# Patient Record
Sex: Female | Born: 1935
Health system: Southern US, Community
[De-identification: ages and names within clinical notes are randomized; demographics above are authoritative.]

## PROBLEM LIST (undated history)

## (undated) DIAGNOSIS — E871 Hypo-osmolality and hyponatremia: Secondary | ICD-10-CM

## (undated) DIAGNOSIS — G4700 Insomnia, unspecified: Secondary | ICD-10-CM

## (undated) DIAGNOSIS — G43909 Migraine, unspecified, not intractable, without status migrainosus: Secondary | ICD-10-CM

## (undated) DIAGNOSIS — E079 Disorder of thyroid, unspecified: Secondary | ICD-10-CM

## (undated) DIAGNOSIS — M792 Neuralgia and neuritis, unspecified: Secondary | ICD-10-CM

## (undated) DIAGNOSIS — J329 Chronic sinusitis, unspecified: Secondary | ICD-10-CM

## (undated) DIAGNOSIS — G4730 Sleep apnea, unspecified: Secondary | ICD-10-CM

## (undated) DIAGNOSIS — C569 Malignant neoplasm of unspecified ovary: Secondary | ICD-10-CM

## (undated) DIAGNOSIS — Z9221 Personal history of antineoplastic chemotherapy: Secondary | ICD-10-CM

## (undated) DIAGNOSIS — K219 Gastro-esophageal reflux disease without esophagitis: Secondary | ICD-10-CM

## (undated) DIAGNOSIS — I10 Essential (primary) hypertension: Secondary | ICD-10-CM

## (undated) DIAGNOSIS — J45909 Unspecified asthma, uncomplicated: Secondary | ICD-10-CM

## (undated) DIAGNOSIS — C50919 Malignant neoplasm of unspecified site of unspecified female breast: Secondary | ICD-10-CM

## (undated) DIAGNOSIS — Z853 Personal history of malignant neoplasm of breast: Secondary | ICD-10-CM

## (undated) DIAGNOSIS — I639 Cerebral infarction, unspecified: Secondary | ICD-10-CM

## (undated) DIAGNOSIS — O321XX0 Maternal care for breech presentation, not applicable or unspecified: Secondary | ICD-10-CM

## (undated) DIAGNOSIS — Z87440 Personal history of urinary (tract) infections: Secondary | ICD-10-CM

## (undated) DIAGNOSIS — J40 Bronchitis, not specified as acute or chronic: Secondary | ICD-10-CM

## (undated) DIAGNOSIS — E119 Type 2 diabetes mellitus without complications: Secondary | ICD-10-CM

## (undated) DIAGNOSIS — E039 Hypothyroidism, unspecified: Secondary | ICD-10-CM

## (undated) DIAGNOSIS — Z803 Family history of malignant neoplasm of breast: Secondary | ICD-10-CM

## (undated) DIAGNOSIS — M069 Rheumatoid arthritis, unspecified: Secondary | ICD-10-CM

## (undated) DIAGNOSIS — J189 Pneumonia, unspecified organism: Secondary | ICD-10-CM

## (undated) HISTORY — PX: BREAST BIOPSY: SHX20

## (undated) HISTORY — DX: Insomnia, unspecified: G47.00

## (undated) HISTORY — DX: Cerebral infarction, unspecified: I63.9

## (undated) HISTORY — PX: REPLACEMENT TOTAL KNEE: SUR1224

## (undated) HISTORY — DX: Chronic sinusitis, unspecified: J32.9

## (undated) HISTORY — PX: TOTAL SHOULDER REPLACEMENT: SUR1217

## (undated) HISTORY — PX: CHOLECYSTECTOMY: SHX55

## (undated) HISTORY — DX: Pneumonia, unspecified organism: J18.9

## (undated) HISTORY — PX: BREAST LUMPECTOMY: SHX2

## (undated) HISTORY — DX: Family history of malignant neoplasm of breast: Z80.3

## (undated) HISTORY — PX: ABDOMINAL HYSTERECTOMY: SHX81

## (undated) HISTORY — DX: Hypo-osmolality and hyponatremia: E87.1

## (undated) HISTORY — DX: Rheumatoid arthritis, unspecified: M06.9

## (undated) HISTORY — DX: Malignant neoplasm of unspecified ovary: C56.9

## (undated) HISTORY — PX: OOPHORECTOMY: SHX86

## (undated) HISTORY — PX: KNEE SURGERY: SHX244

## (undated) HISTORY — PX: JOINT REPLACEMENT: SHX530

## (undated) HISTORY — PX: OVARIAN CYST REMOVAL: SHX89

## (undated) HISTORY — PX: BACK SURGERY: SHX140

## (undated) HISTORY — DX: Malignant neoplasm of unspecified site of unspecified female breast: C50.919

## (undated) HISTORY — DX: Personal history of malignant neoplasm of breast: Z85.3

## (undated) HISTORY — PX: SHOULDER SURGERY: SHX246

---

## 2002-02-14 ENCOUNTER — Emergency Department (HOSPITAL_COMMUNITY): Admission: EM | Admit: 2002-02-14 | Discharge: 2002-02-14 | Payer: Self-pay | Admitting: Emergency Medicine

## 2012-04-26 ENCOUNTER — Encounter (HOSPITAL_COMMUNITY): Payer: Self-pay | Admitting: Emergency Medicine

## 2012-04-26 ENCOUNTER — Inpatient Hospital Stay (HOSPITAL_COMMUNITY): Payer: Medicare Other

## 2012-04-26 ENCOUNTER — Inpatient Hospital Stay (HOSPITAL_COMMUNITY)
Admission: EM | Admit: 2012-04-26 | Discharge: 2012-04-29 | DRG: 871 | Disposition: A | Discharge status: Home / Self Care | Payer: Medicare Other | Attending: Internal Medicine | Admitting: Internal Medicine

## 2012-04-26 ENCOUNTER — Observation Stay (HOSPITAL_COMMUNITY): Payer: Medicare Other

## 2012-04-26 ENCOUNTER — Emergency Department (HOSPITAL_COMMUNITY): Payer: Medicare Other

## 2012-04-26 DIAGNOSIS — E860 Dehydration: Secondary | ICD-10-CM | POA: Diagnosis present

## 2012-04-26 DIAGNOSIS — G4733 Obstructive sleep apnea (adult) (pediatric): Secondary | ICD-10-CM | POA: Diagnosis present

## 2012-04-26 DIAGNOSIS — R4182 Altered mental status, unspecified: Secondary | ICD-10-CM | POA: Diagnosis present

## 2012-04-26 DIAGNOSIS — E876 Hypokalemia: Secondary | ICD-10-CM

## 2012-04-26 DIAGNOSIS — W19XXXA Unspecified fall, initial encounter: Secondary | ICD-10-CM | POA: Diagnosis present

## 2012-04-26 DIAGNOSIS — Z91199 Patient's noncompliance with other medical treatment and regimen due to unspecified reason: Secondary | ICD-10-CM

## 2012-04-26 DIAGNOSIS — Z79899 Other long term (current) drug therapy: Secondary | ICD-10-CM

## 2012-04-26 DIAGNOSIS — K219 Gastro-esophageal reflux disease without esophagitis: Secondary | ICD-10-CM | POA: Diagnosis present

## 2012-04-26 DIAGNOSIS — J1100 Influenza due to unidentified influenza virus with unspecified type of pneumonia: Secondary | ICD-10-CM | POA: Diagnosis present

## 2012-04-26 DIAGNOSIS — Z9119 Patient's noncompliance with other medical treatment and regimen: Secondary | ICD-10-CM

## 2012-04-26 DIAGNOSIS — N390 Urinary tract infection, site not specified: Secondary | ICD-10-CM

## 2012-04-26 DIAGNOSIS — A419 Sepsis, unspecified organism: Secondary | ICD-10-CM

## 2012-04-26 DIAGNOSIS — E119 Type 2 diabetes mellitus without complications: Secondary | ICD-10-CM

## 2012-04-26 DIAGNOSIS — J45901 Unspecified asthma with (acute) exacerbation: Secondary | ICD-10-CM | POA: Diagnosis present

## 2012-04-26 DIAGNOSIS — Z9181 History of falling: Secondary | ICD-10-CM

## 2012-04-26 DIAGNOSIS — R509 Fever, unspecified: Secondary | ICD-10-CM

## 2012-04-26 DIAGNOSIS — R6889 Other general symptoms and signs: Secondary | ICD-10-CM

## 2012-04-26 DIAGNOSIS — A088 Other specified intestinal infections: Secondary | ICD-10-CM | POA: Diagnosis present

## 2012-04-26 DIAGNOSIS — R531 Weakness: Secondary | ICD-10-CM

## 2012-04-26 DIAGNOSIS — D649 Anemia, unspecified: Secondary | ICD-10-CM | POA: Diagnosis present

## 2012-04-26 DIAGNOSIS — J189 Pneumonia, unspecified organism: Secondary | ICD-10-CM | POA: Diagnosis present

## 2012-04-26 DIAGNOSIS — I10 Essential (primary) hypertension: Secondary | ICD-10-CM | POA: Diagnosis present

## 2012-04-26 DIAGNOSIS — R112 Nausea with vomiting, unspecified: Secondary | ICD-10-CM | POA: Diagnosis present

## 2012-04-26 DIAGNOSIS — R6510 Systemic inflammatory response syndrome (SIRS) of non-infectious origin without acute organ dysfunction: Secondary | ICD-10-CM

## 2012-04-26 DIAGNOSIS — Z6835 Body mass index (BMI) 35.0-35.9, adult: Secondary | ICD-10-CM

## 2012-04-26 DIAGNOSIS — Y92009 Unspecified place in unspecified non-institutional (private) residence as the place of occurrence of the external cause: Secondary | ICD-10-CM

## 2012-04-26 DIAGNOSIS — E039 Hypothyroidism, unspecified: Secondary | ICD-10-CM | POA: Diagnosis present

## 2012-04-26 DIAGNOSIS — E669 Obesity, unspecified: Secondary | ICD-10-CM | POA: Diagnosis present

## 2012-04-26 HISTORY — DX: Hypokalemia: E87.6

## 2012-04-26 HISTORY — DX: Migraine, unspecified, not intractable, without status migrainosus: G43.909

## 2012-04-26 HISTORY — DX: Sepsis, unspecified organism: N39.0

## 2012-04-26 HISTORY — DX: Personal history of urinary (tract) infections: Z87.440

## 2012-04-26 HISTORY — DX: Gastro-esophageal reflux disease without esophagitis: K21.9

## 2012-04-26 HISTORY — DX: Sepsis, unspecified organism: A41.9

## 2012-04-26 HISTORY — DX: Disorder of thyroid, unspecified: E07.9

## 2012-04-26 HISTORY — DX: Unspecified asthma, uncomplicated: J45.909

## 2012-04-26 HISTORY — DX: Essential (primary) hypertension: I10

## 2012-04-26 HISTORY — DX: Sleep apnea, unspecified: G47.30

## 2012-04-26 HISTORY — DX: Neuralgia and neuritis, unspecified: M79.2

## 2012-04-26 HISTORY — DX: Type 2 diabetes mellitus without complications: E11.9

## 2012-04-26 LAB — TROPONIN I: Troponin I: <0.3 ng/mL (ref <0.30)

## 2012-04-26 LAB — COMPREHENSIVE METABOLIC PANEL
ALT: 19 U/L (ref 0–35)
AST: 19 U/L (ref 0–37)
Albumin: 3.1 g/dL — ABNORMAL LOW (ref 3.5–5.2)
CO2: 29 mEq/L (ref 19–32)
Chloride: 103 mEq/L (ref 96–112)
Creatinine, Ser: 0.88 mg/dL (ref 0.50–1.10)
GFR calc non Af Amer: 63 mL/min — ABNORMAL LOW (ref >90)
Sodium: 145 mEq/L (ref 135–145)
Total Bilirubin: 0.4 mg/dL (ref 0.3–1.2)

## 2012-04-26 LAB — URINALYSIS, ROUTINE W REFLEX MICROSCOPIC
Ketones, ur: NEGATIVE mg/dL
Leukocytes, UA: NEGATIVE
Nitrite: NEGATIVE
Protein, ur: NEGATIVE mg/dL

## 2012-04-26 LAB — CBC WITH DIFFERENTIAL/PLATELET
Basophils Absolute: 0 10*3/uL (ref 0.0–0.1)
Basophils Relative: 0 % (ref 0–1)
Eosinophils Absolute: 0 10*3/uL (ref 0.0–0.7)
Hemoglobin: 10.6 g/dL — ABNORMAL LOW (ref 12.0–15.0)
MCH: 27.9 pg (ref 26.0–34.0)
MCHC: 31.5 g/dL (ref 30.0–36.0)
Neutro Abs: 18.8 10*3/uL — ABNORMAL HIGH (ref 1.7–7.7)
Neutrophils Relative %: 83 % — ABNORMAL HIGH (ref 43–77)
Platelets: 240 10*3/uL (ref 150–400)
RDW: 14.9 % (ref 11.5–15.5)

## 2012-04-26 LAB — RAPID STREP SCREEN (MED CTR MEBANE ONLY): Streptococcus, Group A Screen (Direct): NEGATIVE

## 2012-04-26 LAB — GLUCOSE, CAPILLARY
Glucose-Capillary: 145 mg/dL — ABNORMAL HIGH (ref 70–99)
Glucose-Capillary: 128 mg/dL — ABNORMAL HIGH (ref 70–99)

## 2012-04-26 MED ORDER — OSELTAMIVIR PHOSPHATE 75 MG PO CAPS
75.0000 mg | ORAL_CAPSULE | Freq: Two times a day (BID) | ORAL | Status: DC
  Administered 2012-04-26 – 2012-04-27 (×2): 75 mg via ORAL
  Filled 2012-04-26 (×3): qty 1

## 2012-04-26 MED ORDER — GUAIFENESIN-DM 100-10 MG/5ML PO SYRP
5.0000 mL | ORAL_SOLUTION | ORAL | Status: DC | PRN
  Administered 2012-04-27 – 2012-04-29 (×2): 5 mL via ORAL
  Filled 2012-04-26 (×2): qty 10

## 2012-04-26 MED ORDER — HYDROMORPHONE HCL PF 1 MG/ML IJ SOLN
0.5000 mg | INTRAMUSCULAR | Status: DC | PRN
  Administered 2012-04-28: 0.5 mg via INTRAVENOUS
  Filled 2012-04-26: qty 1

## 2012-04-26 MED ORDER — GUAIFENESIN ER 600 MG PO TB12
600.0000 mg | ORAL_TABLET | Freq: Two times a day (BID) | ORAL | Status: DC
  Administered 2012-04-26 – 2012-04-28 (×5): 600 mg via ORAL
  Filled 2012-04-26 (×8): qty 1

## 2012-04-26 MED ORDER — SODIUM CHLORIDE 0.9 % IV SOLN
1000.0000 mL | Freq: Once | INTRAVENOUS | Status: AC
  Administered 2012-04-26: 1000 mL via INTRAVENOUS

## 2012-04-26 MED ORDER — CLONAZEPAM 1 MG PO TABS
1.0000 mg | ORAL_TABLET | Freq: Two times a day (BID) | ORAL | Status: DC
  Administered 2012-04-26 – 2012-04-29 (×6): 1 mg via ORAL
  Filled 2012-04-26 (×6): qty 1

## 2012-04-26 MED ORDER — MELOXICAM 15 MG PO TABS
15.0000 mg | ORAL_TABLET | Freq: Every day | ORAL | Status: DC
  Administered 2012-04-27 – 2012-04-29 (×3): 15 mg via ORAL
  Filled 2012-04-26 (×3): qty 1

## 2012-04-26 MED ORDER — POTASSIUM CHLORIDE IN NACL 40-0.9 MEQ/L-% IV SOLN
INTRAVENOUS | Status: DC
  Administered 2012-04-26: 150 mL/h via INTRAVENOUS
  Administered 2012-04-27: 20:00:00 via INTRAVENOUS
  Filled 2012-04-26 (×5): qty 1000

## 2012-04-26 MED ORDER — ACETAMINOPHEN 325 MG PO TABS
650.0000 mg | ORAL_TABLET | Freq: Four times a day (QID) | ORAL | Status: DC | PRN
  Administered 2012-04-26: 650 mg via ORAL
  Filled 2012-04-26: qty 2

## 2012-04-26 MED ORDER — SIMVASTATIN 20 MG PO TABS
20.0000 mg | ORAL_TABLET | Freq: Every day | ORAL | Status: DC
  Administered 2012-04-27 – 2012-04-28 (×2): 20 mg via ORAL
  Filled 2012-04-26 (×3): qty 1

## 2012-04-26 MED ORDER — GABAPENTIN 600 MG PO TABS: 600.0000 mg | ORAL_TABLET | Freq: Four times a day (QID) | ORAL | Status: DC

## 2012-04-26 MED ORDER — GABAPENTIN 300 MG PO CAPS
600.0000 mg | ORAL_CAPSULE | Freq: Four times a day (QID) | ORAL | Status: DC
  Administered 2012-04-26 – 2012-04-29 (×10): 600 mg via ORAL
  Filled 2012-04-26 (×14): qty 2

## 2012-04-26 MED ORDER — POTASSIUM CHLORIDE CRYS ER 20 MEQ PO TBCR
40.0000 meq | EXTENDED_RELEASE_TABLET | Freq: Once | ORAL | Status: AC
  Administered 2012-04-26: 40 meq via ORAL
  Filled 2012-04-26: qty 2

## 2012-04-26 MED ORDER — PIPERACILLIN-TAZOBACTAM 3.375 G IVPB
3.3750 g | Freq: Once | INTRAVENOUS | Status: DC
  Filled 2012-04-26: qty 50

## 2012-04-26 MED ORDER — ACETAMINOPHEN 650 MG RE SUPP: 650.0000 mg | Freq: Four times a day (QID) | RECTAL | Status: DC | PRN

## 2012-04-26 MED ORDER — VANCOMYCIN HCL IN DEXTROSE 1-5 GM/200ML-% IV SOLN
1000.0000 mg | Freq: Once | INTRAVENOUS | Status: DC
  Filled 2012-04-26: qty 200

## 2012-04-26 MED ORDER — ALBUTEROL SULFATE (5 MG/ML) 0.5% IN NEBU
2.5000 mg | INHALATION_SOLUTION | Freq: Four times a day (QID) | RESPIRATORY_TRACT | Status: DC
  Administered 2012-04-26 – 2012-04-28 (×8): 2.5 mg via RESPIRATORY_TRACT
  Filled 2012-04-26 (×9): qty 0.5

## 2012-04-26 MED ORDER — SODIUM CHLORIDE 0.9 % IV SOLN
1000.0000 mL | INTRAVENOUS | Status: DC
  Administered 2012-04-26 (×2): 1000 mL via INTRAVENOUS

## 2012-04-26 MED ORDER — ALBUTEROL SULFATE (5 MG/ML) 0.5% IN NEBU
5.0000 mg | INHALATION_SOLUTION | Freq: Once | RESPIRATORY_TRACT | Status: AC
  Administered 2012-04-26: 5 mg via RESPIRATORY_TRACT
  Filled 2012-04-26: qty 1

## 2012-04-26 MED ORDER — ONDANSETRON HCL 4 MG PO TABS: 4.0000 mg | ORAL_TABLET | Freq: Four times a day (QID) | ORAL | Status: DC | PRN

## 2012-04-26 MED ORDER — LEVOTHYROXINE SODIUM 125 MCG PO TABS
125.0000 ug | ORAL_TABLET | Freq: Every day | ORAL | Status: DC
  Administered 2012-04-26 – 2012-04-28 (×3): 125 ug via ORAL
  Filled 2012-04-26 (×4): qty 1

## 2012-04-26 MED ORDER — MORPHINE SULFATE 4 MG/ML IJ SOLN
4.0000 mg | Freq: Once | INTRAMUSCULAR | Status: AC
  Administered 2012-04-26: 4 mg via INTRAVENOUS
  Filled 2012-04-26: qty 1

## 2012-04-26 MED ORDER — CEFTRIAXONE SODIUM 1 G IJ SOLR
1.0000 g | INTRAMUSCULAR | Status: DC
  Administered 2012-04-26 – 2012-04-28 (×3): 1 g via INTRAVENOUS
  Filled 2012-04-26 (×4): qty 10

## 2012-04-26 MED ORDER — IPRATROPIUM BROMIDE 0.02 % IN SOLN
0.5000 mg | Freq: Once | RESPIRATORY_TRACT | Status: AC
  Administered 2012-04-26: 0.5 mg via RESPIRATORY_TRACT
  Filled 2012-04-26: qty 2.5

## 2012-04-26 MED ORDER — ENOXAPARIN SODIUM 40 MG/0.4ML ~~LOC~~ SOLN
40.0000 mg | SUBCUTANEOUS | Status: DC
  Administered 2012-04-26 – 2012-04-28 (×3): 40 mg via SUBCUTANEOUS
  Filled 2012-04-26 (×4): qty 0.4

## 2012-04-26 MED ORDER — INSULIN ASPART 100 UNIT/ML ~~LOC~~ SOLN
0.0000 [IU] | Freq: Three times a day (TID) | SUBCUTANEOUS | Status: DC
  Administered 2012-04-27 – 2012-04-29 (×7): 2 [IU] via SUBCUTANEOUS
  Administered 2012-04-29: 5 [IU] via SUBCUTANEOUS

## 2012-04-26 MED ORDER — SODIUM CHLORIDE 0.9 % IJ SOLN
3.0000 mL | Freq: Two times a day (BID) | INTRAMUSCULAR | Status: DC
  Administered 2012-04-27 – 2012-04-29 (×3): 3 mL via INTRAVENOUS

## 2012-04-26 MED ORDER — INSULIN ASPART 100 UNIT/ML ~~LOC~~ SOLN
0.0000 [IU] | Freq: Every day | SUBCUTANEOUS | Status: DC
  Administered 2012-04-27: 2 [IU] via SUBCUTANEOUS
  Administered 2012-04-28: 4 [IU] via SUBCUTANEOUS

## 2012-04-26 MED ORDER — POTASSIUM CHLORIDE 10 MEQ/100ML IV SOLN
10.0000 meq | INTRAVENOUS | Status: AC
  Administered 2012-04-26 (×2): 10 meq via INTRAVENOUS
  Filled 2012-04-26 (×2): qty 100

## 2012-04-26 MED ORDER — BIOTENE DRY MOUTH MT LIQD
15.0000 mL | Freq: Two times a day (BID) | OROMUCOSAL | Status: DC
  Administered 2012-04-26 – 2012-04-29 (×6): 15 mL via OROMUCOSAL

## 2012-04-26 MED ORDER — METHYLPREDNISOLONE SODIUM SUCC 40 MG IJ SOLR
40.0000 mg | Freq: Two times a day (BID) | INTRAMUSCULAR | Status: DC
  Administered 2012-04-26 – 2012-04-28 (×4): 40 mg via INTRAVENOUS
  Filled 2012-04-26 (×6): qty 1

## 2012-04-26 MED ORDER — PANTOPRAZOLE SODIUM 40 MG IV SOLR
40.0000 mg | Freq: Two times a day (BID) | INTRAVENOUS | Status: DC
  Administered 2012-04-26 – 2012-04-28 (×4): 40 mg via INTRAVENOUS
  Filled 2012-04-26 (×5): qty 40

## 2012-04-26 MED ORDER — ALBUTEROL SULFATE (5 MG/ML) 0.5% IN NEBU
2.5000 mg | INHALATION_SOLUTION | RESPIRATORY_TRACT | Status: DC | PRN
  Administered 2012-04-27 – 2012-04-29 (×5): 2.5 mg via RESPIRATORY_TRACT
  Filled 2012-04-26 (×5): qty 0.5

## 2012-04-26 MED ORDER — HYDROCODONE-ACETAMINOPHEN 5-325 MG PO TABS
1.0000 | ORAL_TABLET | ORAL | Status: DC | PRN
  Administered 2012-04-28: 1 via ORAL
  Filled 2012-04-26: qty 1

## 2012-04-26 MED ORDER — DEXTROSE 5 % IV SOLN
500.0000 mg | INTRAVENOUS | Status: DC
  Administered 2012-04-26 – 2012-04-28 (×3): 500 mg via INTRAVENOUS
  Filled 2012-04-26 (×4): qty 500

## 2012-04-26 MED ORDER — ONDANSETRON HCL 4 MG/2ML IJ SOLN: 4.0000 mg | Freq: Four times a day (QID) | INTRAMUSCULAR | Status: DC | PRN

## 2012-04-26 MED ORDER — NADOLOL 80 MG PO TABS
80.0000 mg | ORAL_TABLET | Freq: Every day | ORAL | Status: DC
  Administered 2012-04-26 – 2012-04-29 (×4): 80 mg via ORAL
  Filled 2012-04-26 (×4): qty 1

## 2012-04-26 MED ORDER — ALUM & MAG HYDROXIDE-SIMETH 200-200-20 MG/5ML PO SUSP: 30.0000 mL | Freq: Four times a day (QID) | ORAL | Status: DC | PRN

## 2012-04-26 MED ORDER — IPRATROPIUM BROMIDE 0.02 % IN SOLN
0.5000 mg | Freq: Four times a day (QID) | RESPIRATORY_TRACT | Status: DC
  Administered 2012-04-26 – 2012-04-28 (×8): 0.5 mg via RESPIRATORY_TRACT
  Filled 2012-04-26 (×9): qty 2.5

## 2012-04-26 NOTE — ED Notes (Signed)
Per EMS pt came from pt's family where she is visiting from out of town. Pt had more malaise today than normal, pt c/o cough, vomiting, fever, and hyperglycemia. Pt taking Prednisone bc pt hasnt had any energy and pt had staph infection in her nose and taking antibiotics times two weeks for it.

## 2012-04-26 NOTE — ED Notes (Signed)
Called to give report to floor. RN unavailable at this time. Awaiting a call back from them

## 2012-04-26 NOTE — ED Notes (Signed)
[id number] Expected date:<BR> Expected time:<BR> Means of arrival:<BR> Comments:<BR> Fever

## 2012-04-26 NOTE — ED Notes (Signed)
Critical value K+ 2.7

## 2012-04-26 NOTE — H&P (Signed)
[facility] History and Physical  Shannon Higgins. Or[place] Dec [id number] DOB: [birth year] DOA: 04/26/2012  Referring physician: Dr. Jola Schmidt PCP: No primary provider on file.  Specialists: None  Chief Complaint: High fevers, cough, dyspnea, wheezing, nausea and vomiting.  HPI: Shannon Higgins. Shannon Higgins is a 77 y.o. female with history of DM 2, HTN, HL, OSA-noncompliant with CPAP, hypothyroid, GERD, recurrent sinusitis and UTIs, pulmonary hypertension presented to the ED on 12/26 with one-day history of fevers, productive cough, worsening dyspnea and wheezing, nausea and vomiting. Patient is from out of town and is visiting her family in [place]. She was in her usual state of health until 7 PM on 12/25. After dinner she started experiencing heartburns which are usual for her. This was followed by fevers as high as 102.68F, cough productive of brown sputum, dyspnea, wheezing, 3-4 episodes of nausea and vomiting consisting of food and drinks that were consumed without coffee-ground or blood, generalized weakness and 2-3 falls. There was no loss of consciousness or injuries. She also complained of some sore throat after vomiting. She also complains change in voice-hoarse. She denies generalized aches or pains. Her husband apparently had similar respiratory tract symptoms. Patient has been on a steroid dose pack started approximately 5 days ago by her PCP. She received a flu shot 4 days back. This morning her daughters noticed slight confusion and? Hallucinations. In the ED, temperature 101.33F, other vital signs stable, chest x-ray and UA not suggestive of infection, white blood cell count 23,000, potassium of 2.7. Influenza panel PCR-pending. Patient was bolused with IV fluids for clinical dehydration, given a dose of vancomycin and Zosyn, potassium repletion and hospitalist service was requested to admit for further evaluation and management. Per patient and family, patient is starting to feel  better.   Review of Systems: All systems reviewed and apart from history of presenting illness, are negative  Past Medical History  Diagnosis Date  . Asthma   . GERD (gastroesophageal reflux disease)   . Migraine   . Neuropathic pain   . Diabetes mellitus without complication   . Thyroid disease   . Hypertension   . Sleep apnea   . Arthritis   . Sinusitis   . History of recurrent UTIs    Past Surgical History  Procedure Date  . Back surgery   . Knee surgery   . Cholecystectomy   . Abdominal hysterectomy   . Shoulder surgery    Social History:  reports that she has never smoked. She has never used smokeless tobacco. She reports that she does not drink alcohol or use illicit drugs. Married. Independent of activities of daily living.  No Known Allergies  Family History  Problem Relation Age of Onset  . Diabetes Daughter   . Hypertension Daughter   . Fibromyalgia Daughter   . GER disease Daughter   . Fibromyalgia Daughter   . Crohn's disease Daughter   . Asthma Daughter     Prior to Admission medications   Medication Sig Start Date End Date Taking? Authorizing Provider  clonazePAM (KLONOPIN) 1 MG tablet Take 1 mg by mouth every 12 (twelve) hours.   Yes Historical Provider, MD  gabapentin (NEURONTIN) 600 MG tablet Take 600 mg by mouth 4 (four) times daily.   Yes Historical Provider, MD  levothyroxine (SYNTHROID, LEVOTHROID) 125 MCG tablet Take 125 mcg by mouth at bedtime.   Yes Historical Provider, MD  meloxicam (MOBIC) 15 MG tablet Take 15 mg by mouth daily.   Yes Historical Provider, MD  metFORMIN (GLUCOPHAGE) 500 MG tablet Take 500 mg by mouth 2 (two) times daily with a meal.   Yes Historical Provider, MD  nadolol (CORGARD) 80 MG tablet Take 80 mg by mouth daily.   Yes Historical Provider, MD  omeprazole (PRILOSEC) 20 MG capsule Take 20 mg by mouth 2 (two) times daily.   Yes Historical Provider, MD  pravastatin (PRAVACHOL) 40 MG tablet Take 40 mg by mouth at bedtime.    Yes Historical Provider, MD  predniSONE (DELTASONE) 20 MG tablet Take 10-40 mg by mouth daily. 40mg  x 3 days, 30mg  x3 days, 20mg  x3 days, 10mg  x 3 days then stop. Started 04/20/2012   Yes Historical Provider, MD  triamterene-hydrochlorothiazide (DYAZIDE) 37.5-25 MG per capsule Take 1 capsule by mouth every morning.   Yes Historical Provider, MD   Physical Exam: Filed Vitals:   04/26/12 1314 04/26/12 1315 04/26/12 1546  BP: 104/52    Pulse: 89    Temp: 99.1 F (37.3 C)  101.2 F (38.4 C)  TempSrc: Oral  Rectal  Resp: 19    Height: 5\' 3"  (1.6 m)    Weight: 81.647 kg (180 lb)    SpO2: 96% 100%      General exam: Moderately built and obese female patient, ill-looking lying supine in bed with mild increased work of breathing.  Head, eyes and ENT: Nontraumatic and normocephalic. Pupils equally reacting to light and accommodation. Conjunctiva are congested. Oral mucosa is dry. Unable to visualize posterior pharyngeal wall even with tongue depressor. Hoarse.  Neck: Thick. Supple. No JVD, carotid bruit or thyromegaly.  Lymphatics: No lymphadenopathy.  Respiratory system: Reduced breath sounds especially in basis with few basal crackles left greater than right. Bilateral few scattered expiratory medium pitched rhonchi. Mild increased work of breathing but able to speak in full sentences. No accessory muscles active.  Cardiovascular system: First and second heart sounds heard, regular rate and rhythm. No JVD, murmurs or gallops. No pedal edema.  Central nervous system: Alert and oriented. No focal neurological deficits.  Extremities: Symmetric 5 x 5 power. Peripheral pulses symmetrically felt.  Skin: No rash or acute findings.  Musculoskeletal system: Negative exam.  Psychiatry: Pleasant and cooperative.  Labs on Admission:  Basic Metabolic Panel:  Lab [id number] 1450  NA 145  K 2.7*  CL 103  CO2 29  GLUCOSE 145*  BUN 17  CREATININE 0.88  CALCIUM 8.5  MG --  PHOS --    Liver Function Tests:  Lab 04/26/12 1450  AST 19  ALT 19  ALKPHOS 83  BILITOT 0.4  PROT 6.2  ALBUMIN 3.1*   No results found for this basename: LIPASE:5,AMYLASE:5 in the last 168 hours No results found for this basename: AMMONIA:5 in the last 168 hours CBC:  Lab 04/26/12 1450  WBC 22.7*  NEUTROABS 18.8*  HGB 10.6*  HCT 33.6*  MCV 88.4  PLT 240   Cardiac Enzymes:  Lab 04/26/12 1450  CKTOTAL --  CKMB --  CKMBINDEX --  TROPONINI <0.30    BNP (last 3 results) No results found for this basename: PROBNP:3 in the last 8760 hours CBG:  Lab 04/26/12 1332  GLUCAP 145*    Radiological Exams on Admission: Dg Chest 2 View  04/26/2012  *RADIOLOGY REPORT*  Clinical Data: Fever cough and short of breath  CHEST - 2 VIEW  Comparison: None  Findings: Heart size is mildly enlarged.  Negative for heart failure.  The lungs are clear without infiltrate or effusion.  No mass lesion.  Left  shoulder replacement.  IMPRESSION: No acute cardiopulmonary abnormality.   Original Report Authenticated By: Carl Best, M.D.     Assessment/Plan Principal Problem:  *SIRS (systemic inflammatory response syndrome) Active Problems:  Asthma exacerbation  Dehydration  Hypokalemia  Anemia  Diabetes mellitus  HTN (hypertension)  GERD (gastroesophageal reflux disease)  Fall at home  OSA (obstructive sleep apnea)  Nausea and vomiting  Altered mental state  SIRS  Differential diagnosis: Influenza, acute purulent bronchitis, rule out pneumonia, acute viral GE.  Admitted to step down for close monitoring and management and potential for decline.  Blood cultures, urine cultures and influenza panel PCR are pending  Followup repeat chest x-ray-ordered after IV fluid boluses.  Aggressive IV fluid hydration, IV Rocephin and azithromycin, Tamiflu which can be discontinued if influenza panel is negative.  Dehydration  Secondary to SIRS, nausea and vomiting and diuretics.  Aggressive IV fluid  hydration.  Asthma exacerbation   IV Rocephin and azithromycin. Brief IV Solu-Medrol. Bronchodilator nebulizations, oxygen, mucolytic's  Monitor closely.  Hypokalemia  Replete by mouth and IV.  Followup BMP in a.m.  Nausea and vomiting  Secondary to cough and respiratory issues versus acute viral GE.  Diet as tolerated.  GERD  Switched to IV PPIs until able to consistently tolerate pills.  DM 2  Check hemoglobin A1c.  Hold metformin and place on sliding scale insulin.  HTN  Continue Nadolol. Hold diuretics secondary to dehydration.  Altered mental status  Probably secondary to acute illness. Seems to have resolved. No focal deficits.  Monitor  OSA  Noncompliant with CPAP.  Outpatient followup.  Falls  Patient has been falling a few times at home over the last couple of months per family.  PT evaluation.    Code Status: Full Family Communication: Discussed with patient's daughters, Ms. [name] and Hilda Blades at bedside. Disposition Plan: At least 2 midnights. Home when medically stable.  Time spent: 65 minutes  [facility] Pager [phone]  If 7PM-7AM, please contact night-coverage [email or web address] Password [facility] 04/26/2012, 5:51 PM

## 2012-04-26 NOTE — ED Notes (Signed)
hospitalist at bedside

## 2012-04-26 NOTE — ED Notes (Signed)
Called floor to give report. Erasmo Downer RN unavailable waiting her call back

## 2012-04-26 NOTE — ED Provider Notes (Signed)
History     CSN: [id number]  Arrival date & time 04/26/12  1306   First MD Initiated Contact with Patient 04/26/12 1351      Chief Complaint  Patient presents with  . Fever  . Emesis  . Cough    The history is provided by the patient.   Patient reports cough vomiting and fever that began over the past 12-24 hours.  She's continued to feel worse.  She is a diabetic her blood sugar was elevated in the 400s at home earlier this morning.  No urinary symptoms.  No new rash or redness.  She is a diabetic with a history of asthma.  She's had some shortness of breath and some wheezing.  She's not tried her albuterol at home.  Family reports increasing malaise without altered mental status.  He is to stay she's been sleeping more today.  No recent sick contacts.  She denies abdominal pain or chest pain.  Her symptoms are mild to moderate in severity.  Nothing worsens or improves her symptoms.  She's not had a rectal temperature 101.2 on arrival to the emergency department.  Her initial blood sugar was 145.  Past Medical History  Diagnosis Date  . Asthma   . GERD (gastroesophageal reflux disease)   . Migraine   . Neuropathic pain   . Diabetes mellitus without complication   . Thyroid disease     Past Surgical History  Procedure Date  . Back surgery   . Knee surgery   . Cholecystectomy   . Abdominal hysterectomy   . Shoulder surgery     No family history on file.  History  Substance Use Topics  . Smoking status: Never Smoker   . Smokeless tobacco: Never Used  . Alcohol Use: No    OB History    Grav Para Term Preterm Abortions TAB SAB Ect Mult Living                  Review of Systems  Constitutional: Positive for fever.  Respiratory: Positive for cough.   Gastrointestinal: Positive for vomiting.  All other systems reviewed and are negative.    Allergies  Review of patient's allergies indicates no known allergies.  Home Medications   Current Outpatient Rx  Name   Route  Sig  Dispense  Refill  . CLONAZEPAM 1 MG PO TABS   Oral   Take 1 mg by mouth every 12 (twelve) hours.         [name] GABAPENTIN 600 MG PO TABS   Oral   Take 600 mg by mouth 4 (four) times daily.         [name] LEVOTHYROXINE SODIUM 125 MCG PO TABS   Oral   Take 125 mcg by mouth at bedtime.         . MELOXICAM 15 MG PO TABS   Oral   Take 15 mg by mouth daily.         [name] METFORMIN HCL 500 MG PO TABS   Oral   Take 500 mg by mouth 2 (two) times daily with a meal.         . NADOLOL 80 MG PO TABS   Oral   Take 80 mg by mouth daily.         [name] OMEPRAZOLE 20 MG PO CPDR   Oral   Take 20 mg by mouth 2 (two) times daily.         [name] PRAVASTATIN SODIUM 40 MG PO TABS  Oral   Take 40 mg by mouth at bedtime.         [name] PREDNISONE 20 MG PO TABS   Oral   Take 10-40 mg by mouth daily. 40mg  x 3 days, 30mg  x3 days, 20mg  x3 days, 10mg  x 3 days then stop. Started 04/20/2012         . TRIAMTERENE-HCTZ 37.5-25 MG PO CAPS   Oral   Take 1 capsule by mouth every morning.           BP 104/52  Pulse 89  Temp 101.2 F (38.4 C) (Rectal)  Resp 19  Ht 5\' 3"  (1.6 m)  Wt 180 lb (81.647 kg)  BMI 31.89 kg/m2  SpO2 100%  Physical Exam  Nursing note and vitals reviewed. Constitutional: She is oriented to person, place, and time. She appears well-developed and well-nourished. No distress.  HENT:  Head: Normocephalic and atraumatic.       Dry mucous membranes.  Posterior pharynx normal.  Eyes: EOM are normal.  Neck: Normal range of motion.  Cardiovascular: Normal rate, regular rhythm and normal heart sounds.   Pulmonary/Chest: Effort normal and breath sounds normal.  Abdominal: Soft. She exhibits no distension. There is no tenderness.  Musculoskeletal: Normal range of motion.  Neurological: She is alert and oriented to person, place, and time.  Skin: Skin is warm and dry.  Psychiatric: She has a normal mood and affect. Judgment normal.    ED Course  Procedures (including  critical care time)  Labs Reviewed  GLUCOSE, CAPILLARY - Abnormal; Notable for the following:    Glucose-Capillary 145 (*)     All other components within normal limits  CBC WITH DIFFERENTIAL - Abnormal; Notable for the following:    WBC 22.7 (*)     RBC 3.80 (*)     Hemoglobin 10.6 (*)     HCT 33.6 (*)     Neutrophils Relative 83 (*)     Neutro Abs 18.8 (*)     Lymphocytes Relative 8 (*)     Monocytes Absolute 2.0 (*)     All other components within normal limits  COMPREHENSIVE METABOLIC PANEL - Abnormal; Notable for the following:    Potassium 2.7 (*)     Glucose, Bld 145 (*)     Albumin 3.1 (*)     GFR calc non Af Amer 63 (*)     GFR calc Af Amer 73 (*)     All other components within normal limits  LACTIC ACID, PLASMA - Abnormal; Notable for the following:    Lactic Acid, Venous 2.6 (*)     All other components within normal limits  TROPONIN I  URINALYSIS, ROUTINE W REFLEX MICROSCOPIC  CULTURE, BLOOD (ROUTINE X 2)  CULTURE, BLOOD (ROUTINE X 2)  INFLUENZA PANEL BY PCR  URINE CULTURE   Dg Chest 2 View  04/26/2012  *RADIOLOGY REPORT*  Clinical Data: Fever cough and short of breath  CHEST - 2 VIEW  Comparison: None  Findings: Heart size is mildly enlarged.  Negative for heart failure.  The lungs are clear without infiltrate or effusion.  No mass lesion.  Left shoulder replacement.  IMPRESSION: No acute cardiopulmonary abnormality.   Original Report Authenticated By: Carl Best, M.D.    I personally reviewed the imaging tests through PACS system I reviewed available ER/hospitalization records through the EMR   No diagnosis found.    MDM  4:20 PM The patient benefit from at least observational admission overnight for ongoing treatment of  her hypokalemia and following up on her blood and urine cultures regarding her fever 101.2.  Influenza PCR pending at this time.  Given her age and her diabetic status I think this is the safest thing for this patient.  Thank and Zosyn  given to provide coverage while we await cultures.        Hoy Morn, MD 04/26/12 [phone]

## 2012-04-26 NOTE — ED Provider Notes (Signed)
[facility] requests change Pt status to InPt stepdown bed- done.[id number]  Babette Relic, MD 04/26/12 [phone]

## 2012-04-27 ENCOUNTER — Inpatient Hospital Stay (HOSPITAL_COMMUNITY): Payer: Medicare Other

## 2012-04-27 LAB — COMPREHENSIVE METABOLIC PANEL
ALT: 31 U/L (ref 0–35)
AST: 35 U/L (ref 0–37)
Albumin: 2.8 g/dL — ABNORMAL LOW (ref 3.5–5.2)
CO2: 22 mEq/L (ref 19–32)
Calcium: 7.4 mg/dL — ABNORMAL LOW (ref 8.4–10.5)
Chloride: 107 mEq/L (ref 96–112)
GFR calc non Af Amer: 81 mL/min — ABNORMAL LOW (ref >90)
Sodium: 137 mEq/L (ref 135–145)
Total Bilirubin: 0.5 mg/dL (ref 0.3–1.2)

## 2012-04-27 LAB — GLUCOSE, CAPILLARY
Glucose-Capillary: 168 mg/dL — ABNORMAL HIGH (ref 70–99)
Glucose-Capillary: 187 mg/dL — ABNORMAL HIGH (ref 70–99)

## 2012-04-27 LAB — URINE CULTURE

## 2012-04-27 LAB — CBC
Hemoglobin: 9.5 g/dL — ABNORMAL LOW (ref 12.0–15.0)
MCH: 28.4 pg (ref 26.0–34.0)
MCHC: 31.9 g/dL (ref 30.0–36.0)
MCV: 89.2 fL (ref 78.0–100.0)
RBC: 3.34 MIL/uL — ABNORMAL LOW (ref 3.87–5.11)

## 2012-04-27 LAB — INFLUENZA PANEL BY PCR (TYPE A & B)
Influenza A By PCR: NEGATIVE
Influenza B By PCR: NEGATIVE

## 2012-04-27 LAB — LEGIONELLA ANTIGEN, URINE

## 2012-04-27 LAB — HEMOGLOBIN A1C: Hgb A1c MFr Bld: 7 % — ABNORMAL HIGH (ref <5.7)

## 2012-04-27 NOTE — Plan of Care (Signed)
Problem: Consults Goal: Diabetes Guidelines if Diabetic/Glucose > 140 If diabetic or lab glucose is > 140 mg/dl - Initiate Diabetes/Hyperglycemia Guidelines & Document Interventions  Outcome: Completed/Met Date Met:  04/27/12 Pt has CBG's 8, 12, 5 & 10 covered with sliding scale.   Problem: Phase I Progression Outcomes Goal: Voiding-avoid urinary catheter unless indicated Outcome: Completed/Met Date Met:  04/27/12 Pt does not have a foley

## 2012-04-27 NOTE — Evaluation (Signed)
Physical Therapy Evaluation Patient Details Name: Shannon Higgins MRN: [id number] DOB: [birth year] Today's Date: 04/27/2012 Time: [id number] PT Time Calculation (min): 23 min  PT Assessment / Plan / Recommendation Clinical Impression  Pt. is 77 yo female admitted 12/26 with fever, N/V, SIRS. Pt. was visiting family and became sick. Pt. plans to DC to home of daughter , Pt. will benefit from PT while in acute care.    PT Assessment  Patient needs continued PT services    Follow Up Recommendations  Home health PT (may not need.)    Does the patient have the potential to tolerate intense rehabilitation      Barriers to Discharge        Equipment Recommendations  None recommended by PT    Recommendations for Other Services OT consult   Frequency Min 3X/week    Precautions / Restrictions Precautions Precautions: Fall Precaution Comments: dyspnea   Pertinent Vitals/Pain sats pre 100% 2 L.  100% after walk on RA  dyspnea 3-4/4      Mobility  Bed Mobility Bed Mobility: Supine to Sit Supine to Sit: 4: Min assist;HOB elevated;With rails Details for Bed Mobility Assistance: Pt. was able to scoot to edge but need assistance for getting to upright. Transfers Transfers: Sit to Stand;Stand to Sit Sit to Stand: 4: Min assist;From bed Stand to Sit: To chair/3-in-1;4: Min guard Ambulation/Gait Ambulation/Gait Assistance: 4: Min assist Ambulation Distance (Feet): 25 Feet Assistive device: Rolling walker Ambulation/Gait Assistance Details: cues for safe use of RW. Gait Pattern: Step-through pattern Gait velocity: decreased, stop for getting breath.    Shoulder Instructions     Exercises     PT Diagnosis: Difficulty walking;Generalized weakness  PT Problem List: Decreased strength;Decreased activity tolerance;Decreased mobility;Decreased knowledge of use of DME;Decreased knowledge of precautions PT Treatment Interventions: DME instruction;Gait training;Functional mobility  training;Therapeutic activities;Therapeutic exercise;Patient/family education   PT Goals Acute Rehab PT Goals PT Goal Formulation: With patient Time For Goal Achievement: 05/11/12 Potential to Achieve Goals: Good Pt will go Supine/Side to Sit: Independently PT Goal: Supine/Side to Sit - Progress: Goal set today Pt will go Sit to Supine/Side: Independently PT Goal: Sit to Supine/Side - Progress: Goal set today Pt will go Sit to Stand: with modified independence PT Goal: Sit to Stand - Progress: Goal set today Pt will go Stand to Sit: with modified independence PT Goal: Stand to Sit - Progress: Goal set today Pt will Ambulate: 51 - 150 feet;with supervision;with least restrictive assistive device PT Goal: Ambulate - Progress: Goal set today  Visit Information  Last PT Received On: 04/27/12 Assistance Needed: +1    Subjective Data  Subjective: I did not know I was so sick. Patient Stated Goal: To get better and go home   Prior [place] Lives With: Spouse Available Help at Discharge: Family Type of Home:  (going to daughter's home first.) Home Access: Level entry (at back door.) Home Layout: Two level;Able to live on main level with bedroom/bathroom Bathroom Shower/Tub: Walk-in shower Bathroom Toilet: Handicapped height Home Adaptive Equipment: Environmental consultant - rolling;Built-in shower seat Additional Comments: Pt. does not have her RW at daughter's Prior Function Level of Independence: Needs assistance Needs Assistance: Light Housekeeping;Meal Prep Meal Prep: Total Light Housekeeping: Total Able to Take Stairs?: Yes Driving: Yes Vocation: On disability    Cognition  Overall Cognitive Status: Appears within functional limits for tasks assessed/performed Arousal/Alertness: Awake/alert Orientation Level: Appears intact for tasks assessed Behavior During Session: [facility] for tasks performed    Extremity/Trunk  Assessment Right Upper Extremity Assessment RUE  ROM/Strength/Tone: [facility] for tasks assessed Left Upper Extremity Assessment LUE ROM/Strength/Tone: [facility] for tasks assessed Right Lower Extremity Assessment RLE ROM/Strength/Tone: [facility] for tasks assessed Left Lower Extremity Assessment LLE ROM/Strength/Tone: [facility] for tasks assessed   Balance Static Sitting Balance Static Sitting - Balance Support: Bilateral upper extremity supported Static Sitting - Level of Assistance: 5: Stand by assistance  End of Session PT - End of Session Activity Tolerance: Patient limited by fatigue Patient left: in bed;with call bell/phone within reach Nurse Communication: Mobility status  GP     Claretha Cooper 04/27/2012, 10:53 AM  [phone]

## 2012-04-27 NOTE — Progress Notes (Signed)
Patient has a very deep congested cough, deineis smoking. However, she was exposed to second hand smoke from her ex-husband, he was a pack a day smoker, they were married 20 years.

## 2012-04-27 NOTE — Progress Notes (Signed)
[id number] Rosana Hoes, RN, BSN, CCM: CHART REVIEWED AND UPDATED.  Next chart review due on [id number]. NO DISCHARGE NEEDS PRESENT AT THIS TIME. CASE MANAGEMENT [phone]

## 2012-04-27 NOTE — Progress Notes (Signed)
[facility] PROGRESS NOTE  Marcille Blanco. Orland Dec QO:[id number] DOB: [birth year] DOA: 04/26/2012 PCP: No primary provider on file.  Brief narrative Shannon Higgins. Linkenhoker is a 77 y.o. female with history of DM 2, HTN, HL, OSA-noncompliant with CPAP, hypothyroid, GERD, recurrent sinusitis and UTIs, pulmonary hypertension presented to the ED on 12/26 with one-day history of fevers, productive cough, worsening dyspnea and wheezing, nausea and vomiting. She had some sore throat and hoarse voice. Recent exposure to spouse with similar complaints. On steroid dose pack for the last 6 days. Recently received flu shot.In the ED, temperature 101.6F, other vital signs stable, chest x-ray and UA not suggestive of infection, white blood cell count 23,000, potassium of 2.7. Influenza panel PCR-pending.    Assessment/Plan:  SIRS  Differential diagnosis: Influenza, acute purulent bronchitis, rule out pneumonia, acute viral GE.  Admitted to step down for close monitoring and management and potential for decline. Has clinically improved, decreasing leukocytosis and has defervesced. We'll transfer to medical bed. Blood cultures, urine cultures and influenza panel PCR are pending/negative to date. Rapid strep screen negative.  Chest x-rays x2 a few hours apart in the ED on 12/26 did not show pneumonic process. We'll repeat chest x-ray this morning. Continue IV Rocephin, azithromycin and Tamiflu which can be discontinued if influenza panel is negative.  Dehydration  Secondary to SIRS, nausea and vomiting and diuretics.  Improved. Reduce IV fluids and encourage by mouth intake.  Asthma exacerbation  IV Rocephin and azithromycin. Brief IV Solu-Medrol. Bronchodilator nebulizations, oxygen, mucolytic's  Improved. Continue IV steroids for additional day then changed to oral prednisone taper.  Hypokalemia  Repleted  Nausea and vomiting  Secondary to cough and respiratory issues versus acute viral GE.  No further  episodes since admission. Trial of diet and monitor.  GERD  Switched to IV PPIs until able to consistently tolerate pills.  DM 2  Hemoglobin A1c: 7.  Hold metformin and placed on sliding scale insulin. Reasonable inpatient control.  HTN  Continue Nadolol. Holding diuretics secondary to dehydration. Reasonable control.  Altered mental status  Probably secondary to acute illness. Seems to have resolved. No focal deficits.  Monitor  OSA  Noncompliant with CPAP.  Outpatient followup.  Anemia  Probably chronic and some element of dilution.  No bleeding. Follow CBC in am.  Falls  Patient has been falling a few times at home over the last couple of months per family.  PT evaluation.   Code Status: Full Family Communication: Discussed with patient. Disposition Plan: Home when medically stable.   Consultants:  None  Procedures:  None  Antibiotics:  IV Rocephin 12/26 >  IV azithromycin 12/26 >  By mouth Tamiflu 12/26 >  HPI/Subjective: Feels much better. No further nausea or vomiting. Dyspnea has significantly improved. Decreased wheezing. Chest feels congested and coughing thick brown sputum.  Objective: Filed Vitals:   04/27/12 0600 04/27/12 0700 04/27/12 0755 04/27/12 0800  BP: 119/77 117/88 137/84   Pulse: 64 65 72 66  Temp:      TempSrc:      Resp: 22 19 21 20   Height:      Weight:      SpO2: 98% 98% 100% 99%    Intake/Output Summary (Last 24 hours) at 04/27/12 0834 Last data filed at 04/27/12 0800  Gross per 24 hour  Intake 4285.43 ml  Output    300 ml  Net 3985.43 ml   Filed Weights   04/26/12 1314 04/26/12 2000 04/27/12 0400  Weight: 81.647 kg (180  lb) 88.8 kg (195 lb 12.3 oz) 90 kg (198 lb 6.6 oz)    Exam:   General exam: Patient looks much better than on admission in the ED. Comfortable. Obese.  Respiratory system: Much improved breath sounds bilaterally. Occasional bilateral expiratory rhonchi. Few basal crackles. No increased  work of breathing.  Cardiovascular system: First and second heart sounds heard, regular rate and rhythm. No JVD, murmurs or pedal edema. Telemetry shows sinus rhythm.  Gastrointestinal system: Abdomen is nondistended, soft and nontender. Normal bowel sounds heard.  Central nervous system: Alert and oriented. No focal neurological deficits.  Extremities: Symmetric 5 x 5 power.  Data Reviewed: Basic Metabolic Panel:  Lab [id number] 0347 04/26/12 1450  NA 137 145  K 4.7 2.7*  CL 107 103  CO2 22 29  GLUCOSE 174* 145*  BUN 14 17  CREATININE 0.74 0.88  CALCIUM 7.4* 8.5  MG -- --  PHOS -- --   Liver Function Tests:  Lab 04/27/12 0347 04/26/12 1450  AST 35 19  ALT 31 19  ALKPHOS 72 83  BILITOT 0.5 0.4  PROT 5.7* 6.2  ALBUMIN 2.8* 3.1*   No results found for this basename: LIPASE:5,AMYLASE:5 in the last 168 hours No results found for this basename: AMMONIA:5 in the last 168 hours CBC:  Lab 04/27/12 0347 04/26/12 1450  WBC 18.9* 22.7*  NEUTROABS -- 18.8*  HGB 9.5* 10.6*  HCT 29.8* 33.6*  MCV 89.2 88.4  PLT 190 240   Cardiac Enzymes:  Lab 04/26/12 1450  CKTOTAL --  CKMB --  CKMBINDEX --  TROPONINI <0.30   BNP (last 3 results) No results found for this basename: PROBNP:3 in the last 8760 hours CBG:  Lab 04/27/12 0758 04/26/12 2222 04/26/12 1332  GLUCAP 168* 128* 145*    Recent Results (from the past 240 hour(s))  CULTURE, BLOOD (ROUTINE X 2)     Status: Normal (Preliminary result)   Collection Time   04/26/12  2:45 PM      Component Value Range Status Comment   Specimen Description BLOOD RIGHT ARM   Final    Special Requests BOTTLES DRAWN AEROBIC AND ANAEROBIC 4CC   Final    Culture  Setup Time 04/26/2012 23:47   Final    Culture     Final    Value:        BLOOD CULTURE RECEIVED NO GROWTH TO DATE CULTURE WILL BE HELD FOR 5 DAYS BEFORE ISSUING A FINAL NEGATIVE REPORT   Report Status PENDING   Incomplete   CULTURE, BLOOD (ROUTINE X 2)     Status: Normal  (Preliminary result)   Collection Time   04/26/12  2:50 PM      Component Value Range Status Comment   Specimen Description BLOOD RIGHT ARM   Final    Special Requests BOTTLES DRAWN AEROBIC AND ANAEROBIC 3ML   Final    Culture  Setup Time 04/26/2012 23:47   Final    Culture     Final    Value:        BLOOD CULTURE RECEIVED NO GROWTH TO DATE CULTURE WILL BE HELD FOR 5 DAYS BEFORE ISSUING A FINAL NEGATIVE REPORT   Report Status PENDING   Incomplete   RAPID STREP SCREEN     Status: Normal   Collection Time   04/26/12  6:25 PM      Component Value Range Status Comment   Streptococcus, Group A Screen (Direct) NEGATIVE  NEGATIVE Final   MRSA PCR SCREENING  Status: Normal   Collection Time   04/26/12  9:15 PM      Component Value Range Status Comment   MRSA by PCR NEGATIVE  NEGATIVE Final      Studies: Dg Chest 2 View  04/26/2012  *RADIOLOGY REPORT*  Clinical Data: Fever cough and short of breath  CHEST - 2 VIEW  Comparison: None  Findings: Heart size is mildly enlarged.  Negative for heart failure.  The lungs are clear without infiltrate or effusion.  No mass lesion.  Left shoulder replacement.  IMPRESSION: No acute cardiopulmonary abnormality.   Original Report Authenticated By: Carl Best, M.D.    Dg Chest Port 1 View  04/26/2012  *RADIOLOGY REPORT*  Clinical Data: Shortness of breath, weakness  PORTABLE CHEST - 1 VIEW  Comparison: Earlier film of the same day  Findings: Some increase in central pulmonary vascular congestion. No focal airspace infiltrate.  No effusion.  Heart size upper limits normal.  Left shoulder arthroplasty partially seen.  IMPRESSION:  1.  Increase in central pulmonary vascular congestion   Original Report Authenticated By: D. Wallace Going, MD     Scheduled Meds:    . albuterol  2.5 mg Nebulization Q6H  . antiseptic oral rinse  15 mL Mouth Rinse BID  . azithromycin  500 mg Intravenous Q24H  . cefTRIAXone (ROCEPHIN)  IV  1 g Intravenous Q24H  . clonazePAM   1 mg Oral Q12H  . enoxaparin (LOVENOX) injection  40 mg Subcutaneous Q24H  . gabapentin  600 mg Oral QID  . guaiFENesin  600 mg Oral BID  . insulin aspart  0-5 Units Subcutaneous QHS  . insulin aspart  0-9 Units Subcutaneous TID WC  . ipratropium  0.5 mg Nebulization Q6H  . levothyroxine  125 mcg Oral QHS  . meloxicam  15 mg Oral Daily  . methylPREDNISolone (SOLU-MEDROL) injection  40 mg Intravenous Q12H  . nadolol  80 mg Oral Daily  . oseltamivir  75 mg Oral BID  . pantoprazole (PROTONIX) IV  40 mg Intravenous Q12H  . simvastatin  20 mg Oral q1800  . sodium chloride  3 mL Intravenous Q12H   Continuous Infusions:    . 0.9 % NaCl with KCl 40 mEq / L 50 mL/hr at 04/27/12 [id number]    Principal Problem:  *SIRS (systemic inflammatory response syndrome) Active Problems:  Asthma exacerbation  Dehydration  Hypokalemia  Anemia  Diabetes mellitus  HTN (hypertension)  GERD (gastroesophageal reflux disease)  Fall at home  OSA (obstructive sleep apnea)  Nausea and vomiting  Altered mental state    Time spent: 30 minutes    [facility] Pager [phone]. If 8PM-8AM, please contact night-coverage at [email or web address], password [facility] 04/27/2012, 8:34 AM  LOS: 1 day

## 2012-04-28 LAB — CBC
HCT: 31 % — ABNORMAL LOW (ref 36.0–46.0)
MCH: 28.4 pg (ref 26.0–34.0)
MCHC: 31.6 g/dL (ref 30.0–36.0)
MCV: 89.9 fL (ref 78.0–100.0)
Platelets: 190 10*3/uL (ref 150–400)
RDW: 15.7 % — ABNORMAL HIGH (ref 11.5–15.5)
WBC: 19.1 10*3/uL — ABNORMAL HIGH (ref 4.0–10.5)

## 2012-04-28 LAB — BASIC METABOLIC PANEL
BUN: 18 mg/dL (ref 6–23)
Calcium: 8 mg/dL — ABNORMAL LOW (ref 8.4–10.5)
Chloride: 105 mEq/L (ref 96–112)
Creatinine, Ser: 0.81 mg/dL (ref 0.50–1.10)
GFR calc Af Amer: 80 mL/min — ABNORMAL LOW (ref >90)

## 2012-04-28 LAB — GLUCOSE, CAPILLARY: Glucose-Capillary: 198 mg/dL — ABNORMAL HIGH (ref 70–99)

## 2012-04-28 MED ORDER — PREDNISONE 50 MG PO TABS
50.0000 mg | ORAL_TABLET | Freq: Every day | ORAL | Status: DC
  Administered 2012-04-28 – 2012-04-29 (×2): 50 mg via ORAL
  Filled 2012-04-28 (×3): qty 1

## 2012-04-28 MED ORDER — PANTOPRAZOLE SODIUM 40 MG PO TBEC
40.0000 mg | DELAYED_RELEASE_TABLET | Freq: Two times a day (BID) | ORAL | Status: DC
  Administered 2012-04-28 – 2012-04-29 (×2): 40 mg via ORAL
  Filled 2012-04-28 (×4): qty 1

## 2012-04-28 MED ORDER — STARCH (THICKENING) PO POWD: ORAL | Status: DC | PRN

## 2012-04-28 MED ORDER — TRIAMTERENE-HCTZ 37.5-25 MG PO TABS
1.0000 | ORAL_TABLET | Freq: Every morning | ORAL | Status: DC
  Administered 2012-04-28 – 2012-04-29 (×2): 1 via ORAL
  Filled 2012-04-28 (×2): qty 1

## 2012-04-28 MED ORDER — RESOURCE THICKENUP CLEAR PO POWD
ORAL | Status: DC | PRN
  Filled 2012-04-28: qty 125

## 2012-04-28 NOTE — Progress Notes (Signed)
[facility] PROGRESS NOTE  Shannon Higgins. [place] Dec FJ:[id number] DOB: March 21, 1935 DOA: 04/26/2012 PCP: No primary provider on file.  Brief narrative Shannon Higgins. Shannon Higgins is a 77 y.o. female with history of DM 2, HTN, HL, OSA-noncompliant with CPAP, hypothyroid, GERD, recurrent sinusitis and UTIs, pulmonary hypertension presented to the ED on 12/26 with one-day history of fevers, productive cough, worsening dyspnea and wheezing, nausea and vomiting. She had some sore throat and hoarse voice. Recent exposure to spouse with similar complaints. On steroid dose pack for the last 6 days. Recently received flu shot.In the ED, temperature 101.62F, other vital signs stable, chest x-ray and UA not suggestive of infection, white blood cell count 23,000, potassium of 2.7. Influenza panel PCR-pending.    Assessment/Plan:  SIRS  Differential diagnosis: Influenza, acute purulent bronchitis, acute viral GE.  Was initially admitted to step down in bed and then transferred to medical floor on 12/27.  Blood cultures, urine cultures and influenza panel PCR are negative to date. Rapid strep screen negative.  Repeat chest x-rays do not show obvious pneumonic process. Continue IV Rocephin, azithromycin and consider changing to oral antibiotics in a.m. Discontinued Tamiflu. Improved  Dehydration  Secondary to SIRS, nausea and vomiting and diuretics.  Resolved. Discontinue IV fluids  Asthma exacerbation  IV Rocephin and azithromycin. Brief IV Solu-Medrol. Bronchodilator nebulizations, oxygen, mucolytic's  Improved. Change to oral steroids today.  Hypokalemia  Repleted  Nausea and vomiting  Secondary to cough and respiratory issues versus acute viral GE.  Resolved. Tolerating diet.  GERD  Change IV PPIs back to oral.  DM 2  Hemoglobin A1c: 7.  Hold metformin and placed on sliding scale insulin. Reasonable inpatient control.  HTN  Continue Nadolol. Holding diuretics secondary to dehydration. Reasonable  control.  Altered mental status  Probably secondary to acute illness. Seems to have resolved. No focal deficits.  Monitor  OSA  Noncompliant with CPAP.  Outpatient followup.  Anemia  Probably chronic and some element of dilution.  No bleeding. Stable  Leukocytosis  Initially may have been due to [facility]. Now likely from steroids.  Falls  Patient has been falling a few times at home over the last couple of months per family.  PT evaluation.   Code Status: Full Family Communication: Discussed with patient and discussed with daughter Ms. Joelene Millin via phone. Disposition Plan: Home when medically stable. ? 12/29. PT needs to be determined prior to d/c.   Consultants:  None  Procedures:  None  Antibiotics:  IV Rocephin 12/26 >  IV azithromycin 12/26 >  By mouth Tamiflu 12/26 >12/27  HPI/Subjective: Decreasing cough-mostly nonproductive. Mild wheezing overnight. Says she has gained weight in the last couple of weeks. Tolerating diet.  Objective: Filed Vitals:   04/28/12 0505 04/28/12 0507 04/28/12 0654 04/28/12 0806  BP: 154/95     Pulse: 90     Temp: 97.2 F (36.2 C)     TempSrc: Oral     Resp: 18     Height:      Weight:   90.538 kg (199 lb 9.6 oz)   SpO2: 100% 100%  97%    Intake/Output Summary (Last 24 hours) at 04/28/12 1251 Last data filed at 04/28/12 0300  Gross per 24 hour  Intake    650 ml  Output    350 ml  Net    300 ml   Filed Weights   04/26/12 2000 04/27/12 0400 04/28/12 0654  Weight: 88.8 kg (195 lb 12.3 oz) 90 kg (198 lb 6.6  oz) 90.538 kg (199 lb 9.6 oz)    Exam:   General exam: Comfortable. Obese.  Respiratory system: Clear to auscultation. No increased work of breathing.  Cardiovascular system: First and second heart sounds heard, regular rate and rhythm. No JVD, murmurs. Trace pedal edema.   Gastrointestinal system: Abdomen is nondistended, soft and nontender. Normal bowel sounds heard.  Central nervous system: Alert and  oriented. No focal neurological deficits.  Extremities: Symmetric 5 x 5 power.  Data Reviewed: Basic Metabolic Panel:  Lab [id number] 0422 04/27/12 0347 04/26/12 1450  NA 136 137 145  K 4.9 4.7 2.7*  CL 105 107 103  CO2 22 22 29   GLUCOSE 193* 174* 145*  BUN 18 14 17   CREATININE 0.81 0.74 0.88  CALCIUM 8.0* 7.4* 8.5  MG -- -- --  PHOS -- -- --   Liver Function Tests:  Lab 04/27/12 0347 04/26/12 1450  AST 35 19  ALT 31 19  ALKPHOS 72 83  BILITOT 0.5 0.4  PROT 5.7* 6.2  ALBUMIN 2.8* 3.1*   No results found for this basename: LIPASE:5,AMYLASE:5 in the last 168 hours No results found for this basename: AMMONIA:5 in the last 168 hours CBC:  Lab 04/28/12 0422 04/27/12 0347 04/26/12 1450  WBC 19.1* 18.9* 22.7*  NEUTROABS -- -- 18.8*  HGB 9.8* 9.5* 10.6*  HCT 31.0* 29.8* 33.6*  MCV 89.9 89.2 88.4  PLT 190 190 240   Cardiac Enzymes:  Lab 04/26/12 1450  CKTOTAL --  CKMB --  CKMBINDEX --  TROPONINI <0.30   BNP (last 3 results) No results found for this basename: PROBNP:3 in the last 8760 hours CBG:  Lab 04/28/12 1220 04/28/12 0729 04/27/12 2124 04/27/12 1741 04/27/12 1154  GLUCAP 198* 157* 208* 187* 182*    Recent Results (from the past 240 hour(s))  CULTURE, BLOOD (ROUTINE X 2)     Status: Normal (Preliminary result)   Collection Time   04/26/12  2:45 PM      Component Value Range Status Comment   Specimen Description BLOOD RIGHT ARM   Final    Special Requests BOTTLES DRAWN AEROBIC AND ANAEROBIC 4CC   Final    Culture  Setup Time 04/26/2012 23:47   Final    Culture     Final    Value:        BLOOD CULTURE RECEIVED NO GROWTH TO DATE CULTURE WILL BE HELD FOR 5 DAYS BEFORE ISSUING A FINAL NEGATIVE REPORT   Report Status PENDING   Incomplete   CULTURE, BLOOD (ROUTINE X 2)     Status: Normal (Preliminary result)   Collection Time   04/26/12  2:50 PM      Component Value Range Status Comment   Specimen Description BLOOD RIGHT ARM   Final    Special Requests  BOTTLES DRAWN AEROBIC AND ANAEROBIC 3ML   Final    Culture  Setup Time 04/26/2012 23:47   Final    Culture     Final    Value:        BLOOD CULTURE RECEIVED NO GROWTH TO DATE CULTURE WILL BE HELD FOR 5 DAYS BEFORE ISSUING A FINAL NEGATIVE REPORT   Report Status PENDING   Incomplete   URINE CULTURE     Status: Normal   Collection Time   04/26/12  3:46 PM      Component Value Range Status Comment   Specimen Description URINE, CATHETERIZED   Final    Special Requests NONE   Final  Culture  Setup Time 04/27/2012 01:25   Final    Colony Count NO GROWTH   Final    Culture NO GROWTH   Final    Report Status 04/27/2012 FINAL   Final   RAPID STREP SCREEN     Status: Normal   Collection Time   04/26/12  6:25 PM      Component Value Range Status Comment   Streptococcus, Group A Screen (Direct) NEGATIVE  NEGATIVE Final   MRSA PCR SCREENING     Status: Normal   Collection Time   04/26/12  9:15 PM      Component Value Range Status Comment   MRSA by PCR NEGATIVE  NEGATIVE Final      Studies: Dg Chest 2 View  04/27/2012  *RADIOLOGY REPORT*  Clinical Data: Cough, shortness of breath, fever  CHEST - 2 VIEW  Comparison:   the previous day's study  Findings: Improved aeration with improvement in the previously noted central pulmonary vascular congestion.  Patchy airspace opacities in the right infrahilar region and in the posterior left lower lobe, more conspicuous than on prior study.  Blunting of the posterior costophrenic angles suggesting small effusions.  Heart size upper limits normal.  Left shoulder arthroplasty hardware partially seen.  Spondylitic changes near the thoracolumbar junction.  Vascular clips in the upper abdomen.  IMPRESSION:  1.  Improvement in pulmonary vascular congestion with improved aeration. 2.  Small effusions with patchy airspace opacities in both lower lobes.   Original Report Authenticated By: D. Wallace Going, MD    Dg Chest 2 View  04/26/2012  *RADIOLOGY REPORT*   Clinical Data: Fever cough and short of breath  CHEST - 2 VIEW  Comparison: None  Findings: Heart size is mildly enlarged.  Negative for heart failure.  The lungs are clear without infiltrate or effusion.  No mass lesion.  Left shoulder replacement.  IMPRESSION: No acute cardiopulmonary abnormality.   Original Report Authenticated By: Carl Best, M.D.    Dg Chest Port 1 View  04/26/2012  *RADIOLOGY REPORT*  Clinical Data: Shortness of breath, weakness  PORTABLE CHEST - 1 VIEW  Comparison: Earlier film of the same day  Findings: Some increase in central pulmonary vascular congestion. No focal airspace infiltrate.  No effusion.  Heart size upper limits normal.  Left shoulder arthroplasty partially seen.  IMPRESSION:  1.  Increase in central pulmonary vascular congestion   Original Report Authenticated By: D. Wallace Going, MD     Scheduled Meds:    . albuterol  2.5 mg Nebulization Q6H  . antiseptic oral rinse  15 mL Mouth Rinse BID  . azithromycin  500 mg Intravenous Q24H  . cefTRIAXone (ROCEPHIN)  IV  1 g Intravenous Q24H  . clonazePAM  1 mg Oral Q12H  . enoxaparin (LOVENOX) injection  40 mg Subcutaneous Q24H  . gabapentin  600 mg Oral QID  . guaiFENesin  600 mg Oral BID  . insulin aspart  0-5 Units Subcutaneous QHS  . insulin aspart  0-9 Units Subcutaneous TID WC  . ipratropium  0.5 mg Nebulization Q6H  . levothyroxine  125 mcg Oral QHS  . meloxicam  15 mg Oral Daily  . nadolol  80 mg Oral Daily  . pantoprazole  40 mg Oral BID AC  . predniSONE  50 mg Oral Q breakfast  . simvastatin  20 mg Oral q1800  . sodium chloride  3 mL Intravenous Q12H  . triamterene-hydrochlorothiazide  1 each Oral q morning - 10a  Continuous Infusions:    Principal Problem:  *SIRS (systemic inflammatory response syndrome) Active Problems:  Asthma exacerbation  Dehydration  Hypokalemia  Anemia  Diabetes mellitus  HTN (hypertension)  GERD (gastroesophageal reflux disease)  Fall at home  OSA  (obstructive sleep apnea)  Nausea and vomiting  Altered mental state    Time spent: 30 minutes    [facility] Pager [phone]. If 8PM-8AM, please contact night-coverage at [email or web address], password [facility] 04/28/2012, 12:51 PM  LOS: 2 days

## 2012-04-29 DIAGNOSIS — D649 Anemia, unspecified: Secondary | ICD-10-CM

## 2012-04-29 DIAGNOSIS — R4182 Altered mental status, unspecified: Secondary | ICD-10-CM

## 2012-04-29 DIAGNOSIS — J189 Pneumonia, unspecified organism: Secondary | ICD-10-CM

## 2012-04-29 DIAGNOSIS — R509 Fever, unspecified: Secondary | ICD-10-CM

## 2012-04-29 HISTORY — DX: Pneumonia, unspecified organism: J18.9

## 2012-04-29 LAB — BASIC METABOLIC PANEL
BUN: 17 mg/dL (ref 6–23)
Chloride: 105 mEq/L (ref 96–112)
Creatinine, Ser: 0.67 mg/dL (ref 0.50–1.10)
Glucose, Bld: 230 mg/dL — ABNORMAL HIGH (ref 70–99)
Potassium: 5.1 mEq/L (ref 3.5–5.1)

## 2012-04-29 LAB — CBC
HCT: 31.3 % — ABNORMAL LOW (ref 36.0–46.0)
Hemoglobin: 9.7 g/dL — ABNORMAL LOW (ref 12.0–15.0)
MCHC: 31 g/dL (ref 30.0–36.0)
MCV: 89.9 fL (ref 78.0–100.0)
WBC: 13.2 10*3/uL — ABNORMAL HIGH (ref 4.0–10.5)

## 2012-04-29 LAB — GLUCOSE, CAPILLARY
Glucose-Capillary: 189 mg/dL — ABNORMAL HIGH (ref 70–99)
Glucose-Capillary: 295 mg/dL — ABNORMAL HIGH (ref 70–99)

## 2012-04-29 MED ORDER — PREDNISONE (PAK) 10 MG PO TABS: 10.0000 mg | ORAL_TABLET | ORAL | Status: DC

## 2012-04-29 MED ORDER — ALBUTEROL SULFATE (5 MG/ML) 0.5% IN NEBU
2.5000 mg | INHALATION_SOLUTION | RESPIRATORY_TRACT | Status: DC
  Administered 2012-04-29: 2.5 mg via RESPIRATORY_TRACT
  Filled 2012-04-29: qty 0.5

## 2012-04-29 MED ORDER — ALBUTEROL SULFATE HFA 108 (90 BASE) MCG/ACT IN AERS: 1.0000 | INHALATION_SPRAY | Freq: Four times a day (QID) | RESPIRATORY_TRACT | Status: DC

## 2012-04-29 MED ORDER — IPRATROPIUM BROMIDE 0.02 % IN SOLN
0.5000 mg | Freq: Four times a day (QID) | RESPIRATORY_TRACT | Status: DC
  Administered 2012-04-29: 0.5 mg via RESPIRATORY_TRACT
  Filled 2012-04-29: qty 2.5

## 2012-04-29 MED ORDER — LEVOFLOXACIN 500 MG PO TABS: 500.0000 mg | ORAL_TABLET | Freq: Every day | ORAL | Status: DC

## 2012-04-29 MED ORDER — ALBUTEROL SULFATE HFA 108 (90 BASE) MCG/ACT IN AERS
1.0000 | INHALATION_SPRAY | Freq: Four times a day (QID) | RESPIRATORY_TRACT | Status: DC
  Administered 2012-04-29: 1 via RESPIRATORY_TRACT
  Filled 2012-04-29: qty 6.7

## 2012-04-29 NOTE — Progress Notes (Signed)
Patient given all discharge instructions and prescriptions. Given instructions on using albuterol, using teach back method.  Expressed understanding. Will discharge home with family.

## 2012-04-29 NOTE — Progress Notes (Signed)
Patient requests frequent prn nebs but refuses to be awakened for routine therapy. RT treatment protocol completed. Orders changed accordingly.

## 2012-04-29 NOTE — Discharge Summary (Signed)
Physician Discharge Summary  Shannon Higgins. Shannon Higgins QO:[id number] DOB: 1934-09-16 DOA: 04/26/2012  PCP: No primary provider on file.  Admit date: 04/26/2012 Discharge date: 04/29/2012  Time spent: 35 minutes  Discharge Diagnoses:  Principal Problem:  *SIRS (systemic inflammatory response syndrome) Active Problems:  Asthma exacerbation  Dehydration  Hypokalemia  Anemia  Diabetes mellitus  HTN (hypertension)  GERD (gastroesophageal reflux disease)  Fall at home  OSA (obstructive sleep apnea)  Nausea and vomiting  Altered mental state  Pneumonia   Discharge Condition: good  Diet recommendation: diabetic  Filed Weights   04/27/12 0400 04/28/12 0654 04/29/12 0515  Weight: 90 kg (198 lb 6.6 oz) 90.538 kg (199 lb 9.6 oz) 89.812 kg (198 lb)    History of present illness:  HPI: Shannon Higgins is a 77 y.o. female with history of DM 2, HTN, HL, OSA-noncompliant with CPAP, hypothyroid, GERD, recurrent sinusitis and UTIs, pulmonary hypertension presented to the ED on 12/26 with one-day history of fevers, productive cough, worsening dyspnea and wheezing, nausea and vomiting. Patient is from out of town and is visiting her family in [place]. She was in her usual state of health until 7 PM on 12/25. After dinner she started experiencing heartburns which are usual for her. This was followed by fevers as high as 102.70F, cough productive of brown sputum, dyspnea, wheezing, 3-4 episodes of nausea and vomiting consisting of food and drinks that were consumed without coffee-ground or blood, generalized weakness and 2-3 falls. There was no loss of consciousness or injuries. She also complained of some sore throat after vomiting. She also complains change in voice-hoarse. She denies generalized aches or pains. Her husband apparently had similar respiratory tract symptoms. Patient has been on a steroid dose pack started approximately 5 days ago by her PCP. She received a flu shot 4 days back. This  morning her daughters noticed slight confusion and? Hallucinations. In the ED, temperature 101.90F, other vital signs stable, chest x-ray and UA not suggestive of infection, white blood cell count 23,000, potassium of 2.7. Influenza panel PCR-pending. Patient was bolused with IV fluids for clinical dehydration, given a dose of vancomycin and Zosyn, potassium repletion and hospitalist service was requested to admit for further evaluation and management. Per patient and family, patient is starting to feel better   Hospital Course:  SIRS  Differential diagnosis: Influenza, acute purulent bronchitis, acute viral GE.  Was initially admitted to step down in bed and then transferred to medical floor on 12/27.  Blood cultures, urine cultures and influenza panel PCR are negative to date. Rapid strep screen negative.  Repeat chest x-rays with patchy bilat infiltrates - probably patient had pneumonia  The patient did receive IV Rocephin, azithromycin from admission and was changed to po levaquin on discharge  Dehydration  Secondary to SIRS, nausea and vomiting and diuretics.  Resolved. Discontinue IV fluids Asthma exacerbation  IV Rocephin and azithromycin. Brief IV Solu-Medrol. Bronchodilator nebulizations, oxygen, mucolytic's  Improved. Change to oral steroids at discharge to complete a prednisone taper  Hypokalemia  Repleted Nausea and vomiting  Secondary to cough and respiratory issues versus acute viral GE.  Resolved. Tolerating diet.  DM 2  Hemoglobin A1c: 7.  Hold metformin and placed on sliding scale insulin. Reasonable inpatient control.and patient to resume po metformin at DC  HTN  Continue Nadolol. Holding diuretics secondary to dehydration.  Reasonable control. Altered mental status  Probably secondary to acute illness. Seems to have resolved. No focal deficits.  OSA  Noncompliant with CPAP.  Outpatient followup. Anemia  Probably chronic and some element of dilution.  No bleeding.  Stable  Falls  Patient has been falling a few times at home over the last couple of months per family.  She has a walker which she should use      Consultations:  none  Discharge Exam: Filed Vitals:   04/28/12 1446 04/28/12 2200 04/29/12 0515 04/29/12 0805  BP: 152/80 149/94 149/88   Pulse: 63 78 55   Temp: 97.1 F (36.2 C) 97.6 F (36.4 C) 97.6 F (36.4 C)   TempSrc: Oral Oral Oral   Resp: 16 18 16    Height:      Weight:   89.812 kg (198 lb)   SpO2: 98% 94% 100% 100%    General: axox3 Cardiovascular: rrr Respiratory: ctab   Discharge Instructions  Discharge Orders    Future Orders Please Complete By Expires   Diet Carb Modified      Increase activity slowly          Medication List     As of 04/29/2012 11:00 AM    STOP taking these medications         predniSONE 20 MG tablet   Commonly known as: DELTASONE      TAKE these medications         albuterol 108 (90 BASE) MCG/ACT inhaler   Commonly known as: PROVENTIL HFA;VENTOLIN HFA   Inhale 1 puff into the lungs 4 (four) times daily.      clonazePAM 1 MG tablet   Commonly known as: KLONOPIN   Take 1 mg by mouth every 12 (twelve) hours.      gabapentin 600 MG tablet   Commonly known as: NEURONTIN   Take 600 mg by mouth 4 (four) times daily.      levofloxacin 500 MG tablet   Commonly known as: LEVAQUIN   Take 1 tablet (500 mg total) by mouth daily.      levothyroxine 125 MCG tablet   Commonly known as: SYNTHROID, LEVOTHROID   Take 125 mcg by mouth at bedtime.      meloxicam 15 MG tablet   Commonly known as: MOBIC   Take 15 mg by mouth daily.      metFORMIN 500 MG tablet   Commonly known as: GLUCOPHAGE   Take 500 mg by mouth 2 (two) times daily with a meal.      nadolol 80 MG tablet   Commonly known as: CORGARD   Take 80 mg by mouth daily.      omeprazole 20 MG capsule   Commonly known as: PRILOSEC   Take 20 mg by mouth 2 (two) times daily.      pravastatin 40 MG tablet   Commonly  known as: PRAVACHOL   Take 40 mg by mouth at bedtime.      predniSONE 10 MG tablet   Commonly known as: STERAPRED UNI-PAK   Take 1 tablet (10 mg total) by mouth See admin instructions.      triamterene-hydrochlorothiazide 37.5-25 MG per capsule   Commonly known as: DYAZIDE   Take 1 capsule by mouth every morning.           Follow-up Information    Follow up with with PCP at home.          The results of significant diagnostics from this hospitalization (including imaging, microbiology, ancillary and laboratory) are listed below for reference.    Significant Diagnostic Studies: Dg Chest 2 View  04/27/2012  *RADIOLOGY  REPORT*  Clinical Data: Cough, shortness of breath, fever  CHEST - 2 VIEW  Comparison:   the previous day's study  Findings: Improved aeration with improvement in the previously noted central pulmonary vascular congestion.  Patchy airspace opacities in the right infrahilar region and in the posterior left lower lobe, more conspicuous than on prior study.  Blunting of the posterior costophrenic angles suggesting small effusions.  Heart size upper limits normal.  Left shoulder arthroplasty hardware partially seen.  Spondylitic changes near the thoracolumbar junction.  Vascular clips in the upper abdomen.  IMPRESSION:  1.  Improvement in pulmonary vascular congestion with improved aeration. 2.  Small effusions with patchy airspace opacities in both lower lobes.   Original Report Authenticated By: D. Wallace Going, MD    Dg Chest 2 View  04/26/2012  *RADIOLOGY REPORT*  Clinical Data: Fever cough and short of breath  CHEST - 2 VIEW  Comparison: None  Findings: Heart size is mildly enlarged.  Negative for heart failure.  The lungs are clear without infiltrate or effusion.  No mass lesion.  Left shoulder replacement.  IMPRESSION: No acute cardiopulmonary abnormality.   Original Report Authenticated By: Carl Best, M.D.    Dg Chest Port 1 View  04/26/2012  *RADIOLOGY REPORT*   Clinical Data: Shortness of breath, weakness  PORTABLE CHEST - 1 VIEW  Comparison: Earlier film of the same day  Findings: Some increase in central pulmonary vascular congestion. No focal airspace infiltrate.  No effusion.  Heart size upper limits normal.  Left shoulder arthroplasty partially seen.  IMPRESSION:  1.  Increase in central pulmonary vascular congestion   Original Report Authenticated By: D. Wallace Going, MD     Microbiology: Recent Results (from the past 240 hour(s))  CULTURE, BLOOD (ROUTINE X 2)     Status: Normal (Preliminary result)   Collection Time   04/26/12  2:45 PM      Component Value Range Status Comment   Specimen Description BLOOD RIGHT ARM   Final    Special Requests BOTTLES DRAWN AEROBIC AND ANAEROBIC 4CC   Final    Culture  Setup Time 04/26/2012 23:47   Final    Culture     Final    Value:        BLOOD CULTURE RECEIVED NO GROWTH TO DATE CULTURE WILL BE HELD FOR 5 DAYS BEFORE ISSUING A FINAL NEGATIVE REPORT   Report Status PENDING   Incomplete   CULTURE, BLOOD (ROUTINE X 2)     Status: Normal (Preliminary result)   Collection Time   04/26/12  2:50 PM      Component Value Range Status Comment   Specimen Description BLOOD RIGHT ARM   Final    Special Requests BOTTLES DRAWN AEROBIC AND ANAEROBIC 3ML   Final    Culture  Setup Time 04/26/2012 23:47   Final    Culture     Final    Value:        BLOOD CULTURE RECEIVED NO GROWTH TO DATE CULTURE WILL BE HELD FOR 5 DAYS BEFORE ISSUING A FINAL NEGATIVE REPORT   Report Status PENDING   Incomplete   URINE CULTURE     Status: Normal   Collection Time   04/26/12  3:46 PM      Component Value Range Status Comment   Specimen Description URINE, CATHETERIZED   Final    Special Requests NONE   Final    Culture  Setup Time 04/27/2012 01:25   Final    Colony  Count NO GROWTH   Final    Culture NO GROWTH   Final    Report Status 04/27/2012 FINAL   Final   RAPID STREP SCREEN     Status: Normal   Collection Time   04/26/12  6:25  PM      Component Value Range Status Comment   Streptococcus, Group A Screen (Direct) NEGATIVE  NEGATIVE Final   MRSA PCR SCREENING     Status: Normal   Collection Time   04/26/12  9:15 PM      Component Value Range Status Comment   MRSA by PCR NEGATIVE  NEGATIVE Final      Labs: Basic Metabolic Panel:  Lab [id number] 04/28/12 0422 04/27/12 0347 04/26/12 1450  NA 138 136 137 145  K 5.1 4.9 4.7 2.7*  CL 105 105 107 103  CO2 22 22 22 29   GLUCOSE 230* 193* 174* 145*  BUN 17 18 14 17   CREATININE 0.67 0.81 0.74 0.88  CALCIUM 8.8 8.0* 7.4* 8.5  MG -- -- -- --  PHOS -- -- -- --   Liver Function Tests:  Lab 04/27/12 0347 04/26/12 1450  AST 35 19  ALT 31 19  ALKPHOS 72 83  BILITOT 0.5 0.4  PROT 5.7* 6.2  ALBUMIN 2.8* 3.1*   No results found for this basename: LIPASE:5,AMYLASE:5 in the last 168 hours No results found for this basename: AMMONIA:5 in the last 168 hours CBC:  Lab 04/29/12 0409 04/28/12 0422 04/27/12 0347 04/26/12 1450  WBC 13.2* 19.1* 18.9* 22.7*  NEUTROABS -- -- -- 18.8*  HGB 9.7* 9.8* 9.5* 10.6*  HCT 31.3* 31.0* 29.8* 33.6*  MCV 89.9 89.9 89.2 88.4  PLT 215 190 190 240   Cardiac Enzymes:  Lab 04/26/12 1450  CKTOTAL --  CKMB --  CKMBINDEX --  TROPONINI <0.30   BNP: BNP (last 3 results) No results found for this basename: PROBNP:3 in the last 8760 hours CBG:  Lab 04/29/12 0738 04/28/12 2201 04/28/12 1711 04/28/12 1220 04/28/12 0729  GLUCAP 189* 326* 171* 198* 157*       Signed:  Tonette Koehne  [facility] 04/29/2012, 11:00 AM

## 2012-04-29 NOTE — Progress Notes (Signed)
Patient ambulated in halls without oxygen, sats remained above 90%. Will continue to monitor.

## 2012-04-30 NOTE — Care Management Note (Signed)
    Page 1 of 2   04/30/2012     10:18:18 AM   CARE MANAGEMENT NOTE 04/30/2012  Patient:  Higgins D.   Account Number:  [id number]  Date Initiated:  04/27/2012  Documentation initiated by:  DAVIS,RHONDA  Subjective/Objective Assessment:   sepsis vs pna     Action/Plan:   from home   Anticipated DC Date:  04/30/2012   Anticipated DC Plan:  HOME/SELF CARE  In-house referral  NA      DC Planning Services  NA      [facility] Choice  NA   Choice offered to / List presented to:  NA   DME arranged  NA      DME agency  NA     [place] arranged  NA      [place] agency  NA   Status of service:  Completed, signed off Medicare Important Message given?  NA - LOS <3 / Initial given by admissions (If response is "NO", the following Medicare IM given date fields will be blank) Date Medicare IM given:   Date Additional Medicare IM given:    Discharge Disposition:  HOME/SELF CARE  Per UR Regulation:  Reviewed for med. necessity/level of care/duration of stay  If discussed at [place] of Stay Meetings, dates discussed:    Comments:  [id number] Rosana Hoes, RN, BSN, CCM: CHART REVIEWED AND UPDATED.  Next chart review due on [id number]. NO DISCHARGE NEEDS PRESENT AT THIS TIME. CASE MANAGEMENT [phone]

## 2012-05-02 LAB — CULTURE, BLOOD (ROUTINE X 2): Culture: NO GROWTH

## 2013-05-02 DIAGNOSIS — C50919 Malignant neoplasm of unspecified site of unspecified female breast: Secondary | ICD-10-CM

## 2013-05-02 HISTORY — DX: Malignant neoplasm of unspecified site of unspecified female breast: C50.919

## 2013-05-02 HISTORY — PX: BREAST LUMPECTOMY: SHX2

## 2016-06-13 ENCOUNTER — Emergency Department (HOSPITAL_COMMUNITY): Payer: Medicare Other

## 2016-06-13 ENCOUNTER — Inpatient Hospital Stay (HOSPITAL_COMMUNITY)
Admission: EM | Admit: 2016-06-13 | Discharge: 2016-06-16 | DRG: 871 | Disposition: A | Discharge status: Home Health | Payer: Medicare Other | Attending: Internal Medicine | Admitting: Internal Medicine

## 2016-06-13 ENCOUNTER — Encounter (HOSPITAL_COMMUNITY): Payer: Self-pay

## 2016-06-13 DIAGNOSIS — M069 Rheumatoid arthritis, unspecified: Secondary | ICD-10-CM | POA: Diagnosis present

## 2016-06-13 DIAGNOSIS — J101 Influenza due to other identified influenza virus with other respiratory manifestations: Secondary | ICD-10-CM | POA: Diagnosis present

## 2016-06-13 DIAGNOSIS — Z833 Family history of diabetes mellitus: Secondary | ICD-10-CM

## 2016-06-13 DIAGNOSIS — E785 Hyperlipidemia, unspecified: Secondary | ICD-10-CM | POA: Diagnosis present

## 2016-06-13 DIAGNOSIS — Z96611 Presence of right artificial shoulder joint: Secondary | ICD-10-CM | POA: Diagnosis present

## 2016-06-13 DIAGNOSIS — Z8249 Family history of ischemic heart disease and other diseases of the circulatory system: Secondary | ICD-10-CM

## 2016-06-13 DIAGNOSIS — F419 Anxiety disorder, unspecified: Secondary | ICD-10-CM | POA: Diagnosis not present

## 2016-06-13 DIAGNOSIS — I110 Hypertensive heart disease with heart failure: Secondary | ICD-10-CM | POA: Diagnosis present

## 2016-06-13 DIAGNOSIS — N179 Acute kidney failure, unspecified: Secondary | ICD-10-CM | POA: Diagnosis present

## 2016-06-13 DIAGNOSIS — I959 Hypotension, unspecified: Secondary | ICD-10-CM | POA: Diagnosis present

## 2016-06-13 DIAGNOSIS — Z7984 Long term (current) use of oral hypoglycemic drugs: Secondary | ICD-10-CM

## 2016-06-13 DIAGNOSIS — G4733 Obstructive sleep apnea (adult) (pediatric): Secondary | ICD-10-CM | POA: Diagnosis present

## 2016-06-13 DIAGNOSIS — I272 Pulmonary hypertension, unspecified: Secondary | ICD-10-CM | POA: Diagnosis not present

## 2016-06-13 DIAGNOSIS — Z9049 Acquired absence of other specified parts of digestive tract: Secondary | ICD-10-CM

## 2016-06-13 DIAGNOSIS — A419 Sepsis, unspecified organism: Secondary | ICD-10-CM | POA: Diagnosis not present

## 2016-06-13 DIAGNOSIS — Z90710 Acquired absence of both cervix and uterus: Secondary | ICD-10-CM

## 2016-06-13 DIAGNOSIS — I5032 Chronic diastolic (congestive) heart failure: Secondary | ICD-10-CM | POA: Diagnosis not present

## 2016-06-13 DIAGNOSIS — E039 Hypothyroidism, unspecified: Secondary | ICD-10-CM | POA: Diagnosis present

## 2016-06-13 DIAGNOSIS — E871 Hypo-osmolality and hyponatremia: Secondary | ICD-10-CM | POA: Diagnosis not present

## 2016-06-13 DIAGNOSIS — J111 Influenza due to unidentified influenza virus with other respiratory manifestations: Secondary | ICD-10-CM

## 2016-06-13 DIAGNOSIS — R55 Syncope and collapse: Secondary | ICD-10-CM | POA: Diagnosis not present

## 2016-06-13 DIAGNOSIS — Z9119 Patient's noncompliance with other medical treatment and regimen: Secondary | ICD-10-CM

## 2016-06-13 DIAGNOSIS — Z79899 Other long term (current) drug therapy: Secondary | ICD-10-CM

## 2016-06-13 DIAGNOSIS — Z23 Encounter for immunization: Secondary | ICD-10-CM | POA: Diagnosis not present

## 2016-06-13 DIAGNOSIS — J209 Acute bronchitis, unspecified: Secondary | ICD-10-CM | POA: Diagnosis present

## 2016-06-13 DIAGNOSIS — R0602 Shortness of breath: Secondary | ICD-10-CM

## 2016-06-13 DIAGNOSIS — E119 Type 2 diabetes mellitus without complications: Secondary | ICD-10-CM | POA: Diagnosis not present

## 2016-06-13 DIAGNOSIS — K219 Gastro-esophageal reflux disease without esophagitis: Secondary | ICD-10-CM | POA: Diagnosis present

## 2016-06-13 DIAGNOSIS — Z22322 Carrier or suspected carrier of Methicillin resistant Staphylococcus aureus: Secondary | ICD-10-CM

## 2016-06-13 DIAGNOSIS — Z825 Family history of asthma and other chronic lower respiratory diseases: Secondary | ICD-10-CM

## 2016-06-13 DIAGNOSIS — N390 Urinary tract infection, site not specified: Secondary | ICD-10-CM | POA: Diagnosis present

## 2016-06-13 DIAGNOSIS — G9341 Metabolic encephalopathy: Secondary | ICD-10-CM | POA: Diagnosis present

## 2016-06-13 DIAGNOSIS — Z96612 Presence of left artificial shoulder joint: Secondary | ICD-10-CM | POA: Diagnosis present

## 2016-06-13 LAB — CBC WITH DIFFERENTIAL/PLATELET
BASOS PCT: 0 %
Basophils Absolute: 0 10*3/uL (ref 0.0–0.1)
EOS PCT: 0 %
Eosinophils Absolute: 0 10*3/uL (ref 0.0–0.7)
HEMATOCRIT: 37.6 % (ref 36.0–46.0)
HEMOGLOBIN: 12.1 g/dL (ref 12.0–15.0)
LYMPHS ABS: 2 10*3/uL (ref 0.7–4.0)
Lymphocytes Relative: 22 %
MCH: 27.6 pg (ref 26.0–34.0)
MCHC: 32.2 g/dL (ref 30.0–36.0)
MCV: 85.6 fL (ref 78.0–100.0)
Monocytes Absolute: 1 10*3/uL (ref 0.1–1.0)
Monocytes Relative: 11 %
NEUTROS ABS: 6.1 10*3/uL (ref 1.7–7.7)
Neutrophils Relative %: 67 %
Platelets: 292 10*3/uL (ref 150–400)
RBC: 4.39 MIL/uL (ref 3.87–5.11)
RDW: 14.8 % (ref 11.5–15.5)
WBC: 9.1 10*3/uL (ref 4.0–10.5)

## 2016-06-13 LAB — COMPREHENSIVE METABOLIC PANEL
ALBUMIN: 3.9 g/dL (ref 3.5–5.0)
ALT: 20 U/L (ref 14–54)
AST: 33 U/L (ref 15–41)
Alkaline Phosphatase: 92 U/L (ref 38–126)
Anion gap: 14 (ref 5–15)
BILIRUBIN TOTAL: 0.8 mg/dL (ref 0.3–1.2)
BUN: 13 mg/dL (ref 6–20)
CHLORIDE: 92 mmol/L — AB (ref 101–111)
CO2: 22 mmol/L (ref 22–32)
Calcium: 9.2 mg/dL (ref 8.9–10.3)
Creatinine, Ser: 1.32 mg/dL — ABNORMAL HIGH (ref 0.44–1.00)
GFR calc Af Amer: 43 mL/min — ABNORMAL LOW (ref >60)
GFR calc non Af Amer: 37 mL/min — ABNORMAL LOW (ref >60)
GLUCOSE: 167 mg/dL — AB (ref 65–99)
POTASSIUM: 3.8 mmol/L (ref 3.5–5.1)
SODIUM: 128 mmol/L — AB (ref 135–145)
Total Protein: 6.8 g/dL (ref 6.5–8.1)

## 2016-06-13 LAB — I-STAT TROPONIN, ED: Troponin i, poc: 0 ng/mL (ref 0.00–0.08)

## 2016-06-13 LAB — I-STAT CG4 LACTIC ACID, ED: LACTIC ACID, VENOUS: 3.05 mmol/L — AB (ref 0.5–1.9)

## 2016-06-13 MED ORDER — SODIUM CHLORIDE 0.9 % IV BOLUS (SEPSIS)
2000.0000 mL | Freq: Once | INTRAVENOUS | Status: AC
  Administered 2016-06-13: 2000 mL via INTRAVENOUS

## 2016-06-13 MED ORDER — PIPERACILLIN-TAZOBACTAM 3.375 G IVPB 30 MIN
3.3750 g | Freq: Once | INTRAVENOUS | Status: AC
  Administered 2016-06-13: 3.375 g via INTRAVENOUS
  Filled 2016-06-13: qty 50

## 2016-06-13 MED ORDER — NOREPINEPHRINE BITARTRATE 1 MG/ML IV SOLN
0.0000 ug/min | Freq: Once | INTRAVENOUS | Status: DC
  Filled 2016-06-13: qty 4

## 2016-06-13 MED ORDER — OSELTAMIVIR PHOSPHATE 6 MG/ML PO SUSR
30.0000 mg | Freq: Two times a day (BID) | ORAL | Status: DC
Start: 2016-06-13 — End: 2016-06-16
  Administered 2016-06-13 – 2016-06-16 (×6): 30 mg via ORAL
  Filled 2016-06-13 (×6): qty 12.5

## 2016-06-13 MED ORDER — DEXTROSE 5 % IV SOLN
0.0000 ug/min | Freq: Once | INTRAVENOUS | Status: DC
  Filled 2016-06-13: qty 4

## 2016-06-13 MED ORDER — VANCOMYCIN HCL IN DEXTROSE 1-5 GM/200ML-% IV SOLN
1000.0000 mg | Freq: Once | INTRAVENOUS | Status: AC
  Administered 2016-06-13: 1000 mg via INTRAVENOUS
  Filled 2016-06-13: qty 200

## 2016-06-13 MED ORDER — SODIUM CHLORIDE 0.9 % IV BOLUS (SEPSIS)
250.0000 mL | Freq: Once | INTRAVENOUS | Status: AC
  Administered 2016-06-13: 250 mL via INTRAVENOUS

## 2016-06-13 MED ORDER — PIPERACILLIN-TAZOBACTAM 3.375 G IVPB
3.3750 g | Freq: Three times a day (TID) | INTRAVENOUS | Status: DC
  Administered 2016-06-14 – 2016-06-15 (×5): 3.375 g via INTRAVENOUS
  Filled 2016-06-13 (×6): qty 50

## 2016-06-13 MED ORDER — VANCOMYCIN HCL IN DEXTROSE 1-5 GM/200ML-% IV SOLN
1000.0000 mg | INTRAVENOUS | Status: DC
  Administered 2016-06-14 – 2016-06-15 (×2): 1000 mg via INTRAVENOUS
  Filled 2016-06-13 (×2): qty 200

## 2016-06-13 MED ORDER — SODIUM CHLORIDE 0.9 % IV BOLUS (SEPSIS): 1000.0000 mL | Freq: Once | INTRAVENOUS | Status: DC

## 2016-06-13 MED ORDER — SODIUM CHLORIDE 0.9 % IV BOLUS (SEPSIS)
1000.0000 mL | Freq: Once | INTRAVENOUS | Status: AC
  Administered 2016-06-13: 1000 mL via INTRAVENOUS

## 2016-06-13 NOTE — ED Provider Notes (Signed)
Medical screening examination/treatment/procedure(s) were conducted as a shared visit with non-physician practitioner(s) and myself.  I personally evaluated the patient during the encounter.   EKG Interpretation  Date/Time:  Monday June 13 2016 20:42:16 EST Ventricular Rate:  70 PR Interval:  172 QRS Duration: 78 QT Interval:  414 QTC Calculation: 447 R Axis:   54 Text Interpretation:  Normal sinus rhythm Normal ECG Confirmed by Casaundra Takacs  MD, Deondrea Markos ([id number]) on 06/13/2016 10:21:10 PM     82 year old female presents with cough fever and chills 24 hours. Was seen by EMS today but refused transport. Now complains of weakness and is febrile. Patient symptoms consistent with likely influenza. She is hypertensive here and given IV fluids and has responded well. Blood work is still pending at this time and patient to be admitted.   Lacretia Leigh, MD 06/13/16 2222

## 2016-06-13 NOTE — ED Notes (Signed)
This rn went to get patient from the waiting room, the patient is unresponsive to voice and breathing is fast, patient is drooling, casey rn assisted with putting the patient in the bed, patient remains unresponsive, diaphoretic, pulse strong and regular. daughter states she had a fever of 103 at home and went unresponsive once but refused to come to the ed, after sternal rubbing the patient she opened her eyes then closed them again and could not hold a conversation with me, edp at bedside.

## 2016-06-13 NOTE — ED Notes (Signed)
Katie-RN at [facility] notified

## 2016-06-13 NOTE — Progress Notes (Signed)
Pharmacy Antibiotic Note Marcedes Tech. Carneiro is a 82 y.o. female admitted on 06/13/2016 with sepsis.  Pharmacy has been consulted for Zosyn and vancomycin  dosing.  Plan: Vancomycin 1000. IV every 24 hours.  Goal trough 15-20 mcg/mL. Zosyn 3.375g IV q8h (4 hour infusion).  Height: 5' 4.5" (163.8 cm) Weight: 160 lb (72.6 kg) IBW/kg (Calculated) : 55.85  Temp (24hrs), Avg:97.6 F (36.4 C), Min:97.6 F (36.4 C), Max:97.6 F (36.4 C)   Recent Labs Lab 06/13/16 2048 06/13/16 2103  WBC 9.1  --   CREATININE 1.32*  --   LATICACIDVEN  --  3.05*    Estimated Creatinine Clearance: 34.2 mL/min (by C-G formula based on SCr of 1.32 mg/dL (H)).    No Known Allergies  Antimicrobials this admission: 2/12 Zosyn  >>  2/12 vancomycin >>  2/12 Tamiflu  Microbiology results:  Thank you for allowing pharmacy to be a part of this patient's care.  Vincenza Hews, PharmD, BCPS 06/13/2016, 10:15 PM

## 2016-06-13 NOTE — ED Triage Notes (Signed)
Pt complaining of cough and fever/chills. Pt states temp = 103 yesterday. Pt denies any N/V/D. Pt states coughing up yellow sputum.

## 2016-06-13 NOTE — ED Provider Notes (Signed)
[facility] DEPT Provider Note   CSN: [id number] Arrival date & time: 06/13/16  2031    History   Chief Complaint Chief Complaint  Patient presents with  . Cough  . Fever    Level V caveat secondary to acuity of condition  HPI Shannon Higgins. Flaming is a 82 y.o. female.  82 year old female with a history of asthma, reflux, diabetes mellitus, and hypertension presents to the emergency department for flulike illness. Family states that patient developed a fever of up to 103F yesterday. She is complaining of a headache as well as body aches and a cough. No reported sick contacts and patient, per daughter, did not receive her flu shot this. The patient is too lethargic to contribute to her history significantly, but does not specifically complain of pain. Daughter denies nausea and vomiting as well as diarrhea. No known urinary symptoms; however, daughter states that urine off and remains "strong smelling".     Past Medical History:  Diagnosis Date  . Arthritis   . Asthma   . Diabetes mellitus without complication ([place])   . GERD (gastroesophageal reflux disease)   . History of recurrent UTIs   . Hypertension   . Migraine   . Neuropathic pain   . Sinusitis   . Sleep apnea   . Thyroid disease     Patient Active Problem List   Diagnosis Date Noted  . Pneumonia 04/29/2012  . SIRS (systemic inflammatory response syndrome) ([place]) 04/26/2012  . Asthma exacerbation 04/26/2012  . Dehydration 04/26/2012  . Hypokalemia 04/26/2012  . Anemia 04/26/2012  . Diabetes mellitus ([place]) 04/26/2012  . HTN (hypertension) 04/26/2012  . GERD (gastroesophageal reflux disease) 04/26/2012  . Fall at home 04/26/2012  . OSA (obstructive sleep apnea) 04/26/2012  . Nausea and vomiting 04/26/2012  . Altered mental state 04/26/2012    Past Surgical History:  Procedure Laterality Date  . ABDOMINAL HYSTERECTOMY    . BACK SURGERY    . CHOLECYSTECTOMY    . KNEE SURGERY    . SHOULDER SURGERY       OB History    No data available       Home Medications    Prior to Admission medications   Medication Sig Start Date End Date Taking? Authorizing Provider  albuterol (PROVENTIL HFA;VENTOLIN HFA) 108 (90 BASE) MCG/ACT inhaler Inhale 1 puff into the lungs 4 (four) times daily. 04/29/12  Yes Sorin June Leap, MD  clonazePAM (KLONOPIN) 1 MG tablet Take 1 mg by mouth 2 (two) times daily as needed.    Yes Historical Provider, MD  gabapentin (NEURONTIN) 600 MG tablet Take 600 mg by mouth 4 (four) times daily.   Yes Historical Provider, MD  levothyroxine (SYNTHROID, LEVOTHROID) 125 MCG tablet Take 125 mcg by mouth at bedtime.   Yes Historical Provider, MD  meloxicam (MOBIC) 15 MG tablet Take 15 mg by mouth daily.   Yes Historical Provider, MD  metFORMIN (GLUCOPHAGE) 500 MG tablet Take 500 mg by mouth 2 (two) times daily with a meal.   Yes Historical Provider, MD  omeprazole (PRILOSEC) 20 MG capsule Take 20 mg by mouth 2 (two) times daily.   Yes Historical Provider, MD  pravastatin (PRAVACHOL) 40 MG tablet Take 40 mg by mouth at bedtime.   Yes Historical Provider, MD  triamterene-hydrochlorothiazide (DYAZIDE) 37.5-25 MG per capsule Take 1 capsule by mouth every morning.   Yes Historical Provider, MD  predniSONE (STERAPRED UNI-PAK) 10 MG tablet Take 1 tablet (10 mg total) by mouth See admin instructions.  Patient not taking: Reported on 06/13/2016 04/29/12   Sorin June Leap, MD    Family History Family History  Problem Relation Age of Onset  . Diabetes Daughter   . Hypertension Daughter   . Fibromyalgia Daughter   . GER disease Daughter   . Fibromyalgia Daughter   . Crohn's disease Daughter   . Asthma Daughter     Social History Social History  Substance Use Topics  . Smoking status: Never Smoker  . Smokeless tobacco: Never Used  . Alcohol use No     Allergies   Patient has no known allergies.   Review of Systems Review of Systems  Unable to perform ROS: Acuity of condition     Physical Exam Updated Vital Signs BP (!) 77/54   Pulse 94   Temp 98.6 F (37 C) (Rectal)   Resp 18   Ht 5' 4.5" (1.638 m)   Wt 72.6 kg   SpO2 100%   BMI 27.04 kg/m   Physical Exam  Constitutional: She appears well-developed and well-nourished. She appears lethargic. She appears ill. No distress.  Patient is septic appearing and lethargic.  HENT:  Head: Normocephalic and atraumatic.  Mildly dry mm  Eyes: Conjunctivae and EOM are normal. Pupils are equal, round, and reactive to light. No scleral icterus.  Neck: Normal range of motion.  Cardiovascular: Normal rate, regular rhythm and intact distal pulses.   Pulmonary/Chest: Effort normal. No respiratory distress. She has no wheezes. She has no rales.  Lungs grossly clear. Chest expansion symmetric.  Abdominal: Soft. She exhibits no distension. There is no tenderness. There is no guarding.  Soft, nontender abdomen.  Musculoskeletal: Normal range of motion.  Neurological: She appears lethargic.  Patient alert to loud voice and painful stimuli. She is speaking in full sentences; however slower to respond. Patient moving all extremities.  Skin: Skin is warm and dry. No rash noted. No erythema.  Pale in appearance  Psychiatric: She has a normal mood and affect. Her behavior is normal.  Nursing note and vitals reviewed.    ED Treatments / Results  Labs (all labs ordered are listed, but only abnormal results are displayed) Labs Reviewed  COMPREHENSIVE METABOLIC PANEL - Abnormal; Notable for the following:       Result Value   Sodium 128 (*)    Chloride 92 (*)    Glucose, Bld 167 (*)    Creatinine, Ser 1.32 (*)    GFR calc non Af Amer 37 (*)    GFR calc Af Amer 43 (*)    All other components within normal limits  INFLUENZA PANEL BY PCR (TYPE A & B) - Abnormal; Notable for the following:    Influenza B By PCR POSITIVE (*)    All other components within normal limits  I-STAT CG4 LACTIC ACID, ED - Abnormal; Notable for the  following:    Lactic Acid, Venous 3.05 (*)    All other components within normal limits  CULTURE, BLOOD (ROUTINE X 2)  CULTURE, BLOOD (ROUTINE X 2)  CBC WITH DIFFERENTIAL/PLATELET  URINALYSIS, ROUTINE W REFLEX MICROSCOPIC  I-STAT TROPOININ, ED  I-STAT TROPOININ, ED  I-STAT CG4 LACTIC ACID, ED    EKG  EKG Interpretation  Date/Time:  Monday June 13 2016 20:42:16 EST Ventricular Rate:  70 PR Interval:  172 QRS Duration: 78 QT Interval:  414 QTC Calculation: 447 R Axis:   54 Text Interpretation:  Normal sinus rhythm Normal ECG Confirmed by ALLEN  MD, ANTHONY ([id number]) on 06/13/2016 10:21:08 PM  Radiology Dg Chest 2 View  Result Date: 06/13/2016 CLINICAL DATA:  Cough and cold since "Sunday. EXAM: CHEST  2 VIEW COMPARISON:  04/27/2012 FINDINGS: Heart is top normal and stable in appearance with aortic atherosclerosis. Mild chronic interstitial prominence is noted bilaterally with mild peribronchial thickening suggestive of bronchitic change. No pneumonic consolidation, CHF nor effusion. No pneumothorax. Bilateral shoulder arthroplasties. Surgical clips project over the left lateral chest wall. IMPRESSION: Stable cardiomegaly with aortic atherosclerosis. Mild bronchitic change of the lungs. Electronically Signed   By: David  Kwon M.D.   On: 06/13/2016 21:22    Procedures Procedures (including critical care time)  Medications Ordered in ED Medications  oseltamivir (TAMIFLU) 6 MG/ML suspension 30 mg (30 mg Oral Given 06/13/16 2230)  piperacillin-tazobactam (ZOSYN) IVPB 3.375 g (not administered)  vancomycin (VANCOCIN) IVPB 1000 mg/200 mL premix (not administered)  norepinephrine (LEVOPHED) 4 mg in dextrose 5 % 250 mL (0.016 mg/mL) infusion (not administered)  sodium chloride 0.9 % bolus 2,000 mL (0 mLs Intravenous Stopped 06/13/16 2335)  piperacillin-tazobactam (ZOSYN) IVPB 3.375 g (0 g Intravenous Stopped 06/13/16 2253)  vancomycin (VANCOCIN) IVPB 1000 mg/200 mL premix (0 mg  Intravenous Stopped 06/13/16 2335)  sodium chloride 0.9 % bolus 250 mL (0 mLs Intravenous Stopped 06/13/16 2335)  sodium chloride 0.9 % bolus 1,000 mL (1,000 mLs Intravenous New Bag/Given 06/13/16 2339)    CRITICAL CARE Performed by: ,    Total critical care time: 45 minutes  Critical care time was exclusive of separately billable procedures and treating other patients.  Critical care was necessary to treat or prevent imminent or life-threatening deterioration.  Critical care was time spent personally by me on the following activities: development of treatment plan with patient and/or surrogate as well as nursing, discussions with consultants, evaluation of patient's response to treatment, examination of patient, obtaining history from patient or surrogate, ordering and performing treatments and interventions, ordering and review of laboratory studies, ordering and review of radiographic studies, pulse oximetry and re-evaluation of patient's condition.   Initial Impression / Assessment and Plan / ED Course  I have reviewed the triage vital signs and the nursing notes.  Pertinent labs & imaging results that were available during my care of the patient were reviewed by me and considered in my medical decision making (see chart for details).      81"  year old female presents to the emergency department for evaluation of flulike illness. She was found to be hypotensive down to 55 systolic on initial presentation, pale, ill appearing, and lethargic. No compensatory tachycardia; however, patient is on beta blockers. Daughter reports onset of symptoms yesterday including cough, body aches, headache, and fever of Tmax 103F.  Patient afebrile today. No leukocytosis, but lactate elevated just over 3. Her blood pressure has responded appropriately to IV fluids. When not receiving fluid boluses, however, patient does remain persistently hypotensive around 23'T systolic. Given her lactic  acidosis, AKI, and persistent hypotension with possible need for pressors, critical care was consulted for admission. They will evaluate the patient in the ED. Family made aware of plan. Patient agreeable to admission.  1:07 AM Patient has been seen by critical care. Blood pressure has remained stable after receiving fourth liter of fluid. PCCM believes that the patient is stable for stepdown. Will consult with [facility] for admission.  1:31 AM  Case discussed with Dr. Tamala Julian who will admit to SDU   Final Clinical Impressions(s) / ED Diagnoses   Final diagnoses:  Influenza  AKI (acute kidney injury) ([place])  Sepsis,  due to unspecified organism [facility])    New Prescriptions New Prescriptions   No medications on file     Antonietta Breach, PA-C 06/14/16 0031    Antonietta Breach, PA-C 06/14/16 0131

## 2016-06-13 NOTE — ED Notes (Signed)
Pt is now doing much better and is talking to this rn, daughter remains at bedside along with this rn

## 2016-06-14 ENCOUNTER — Inpatient Hospital Stay (HOSPITAL_COMMUNITY): Payer: Medicare Other

## 2016-06-14 DIAGNOSIS — G9341 Metabolic encephalopathy: Secondary | ICD-10-CM | POA: Diagnosis not present

## 2016-06-14 DIAGNOSIS — R0600 Dyspnea, unspecified: Secondary | ICD-10-CM | POA: Diagnosis not present

## 2016-06-14 DIAGNOSIS — E118 Type 2 diabetes mellitus with unspecified complications: Secondary | ICD-10-CM | POA: Diagnosis not present

## 2016-06-14 DIAGNOSIS — Z23 Encounter for immunization: Secondary | ICD-10-CM | POA: Diagnosis not present

## 2016-06-14 DIAGNOSIS — I959 Hypotension, unspecified: Secondary | ICD-10-CM

## 2016-06-14 DIAGNOSIS — G4733 Obstructive sleep apnea (adult) (pediatric): Secondary | ICD-10-CM | POA: Diagnosis not present

## 2016-06-14 DIAGNOSIS — E039 Hypothyroidism, unspecified: Secondary | ICD-10-CM | POA: Diagnosis not present

## 2016-06-14 DIAGNOSIS — R6510 Systemic inflammatory response syndrome (SIRS) of non-infectious origin without acute organ dysfunction: Secondary | ICD-10-CM

## 2016-06-14 DIAGNOSIS — E119 Type 2 diabetes mellitus without complications: Secondary | ICD-10-CM | POA: Diagnosis not present

## 2016-06-14 DIAGNOSIS — F419 Anxiety disorder, unspecified: Secondary | ICD-10-CM | POA: Diagnosis not present

## 2016-06-14 DIAGNOSIS — N179 Acute kidney failure, unspecified: Secondary | ICD-10-CM | POA: Diagnosis not present

## 2016-06-14 DIAGNOSIS — R55 Syncope and collapse: Secondary | ICD-10-CM | POA: Diagnosis not present

## 2016-06-14 DIAGNOSIS — T8619 Other complication of kidney transplant: Secondary | ICD-10-CM

## 2016-06-14 DIAGNOSIS — I5032 Chronic diastolic (congestive) heart failure: Secondary | ICD-10-CM | POA: Diagnosis not present

## 2016-06-14 DIAGNOSIS — M069 Rheumatoid arthritis, unspecified: Secondary | ICD-10-CM | POA: Diagnosis not present

## 2016-06-14 DIAGNOSIS — E871 Hypo-osmolality and hyponatremia: Secondary | ICD-10-CM | POA: Diagnosis not present

## 2016-06-14 DIAGNOSIS — J101 Influenza due to other identified influenza virus with other respiratory manifestations: Secondary | ICD-10-CM | POA: Diagnosis not present

## 2016-06-14 DIAGNOSIS — K219 Gastro-esophageal reflux disease without esophagitis: Secondary | ICD-10-CM | POA: Diagnosis not present

## 2016-06-14 DIAGNOSIS — I272 Pulmonary hypertension, unspecified: Secondary | ICD-10-CM | POA: Diagnosis not present

## 2016-06-14 DIAGNOSIS — E785 Hyperlipidemia, unspecified: Secondary | ICD-10-CM | POA: Diagnosis not present

## 2016-06-14 DIAGNOSIS — J209 Acute bronchitis, unspecified: Secondary | ICD-10-CM | POA: Diagnosis not present

## 2016-06-14 DIAGNOSIS — A419 Sepsis, unspecified organism: Secondary | ICD-10-CM | POA: Diagnosis not present

## 2016-06-14 DIAGNOSIS — I110 Hypertensive heart disease with heart failure: Secondary | ICD-10-CM | POA: Diagnosis not present

## 2016-06-14 HISTORY — DX: Other complication of kidney transplant: N17.9

## 2016-06-14 HISTORY — DX: Hypotension, unspecified: I95.9

## 2016-06-14 HISTORY — DX: Other complication of kidney transplant: T86.19

## 2016-06-14 LAB — CBC
HEMATOCRIT: 38.2 % (ref 36.0–46.0)
Hemoglobin: 12.3 g/dL (ref 12.0–15.0)
MCH: 27.5 pg (ref 26.0–34.0)
MCHC: 32.2 g/dL (ref 30.0–36.0)
MCV: 85.5 fL (ref 78.0–100.0)
PLATELETS: 274 10*3/uL (ref 150–400)
RBC: 4.47 MIL/uL (ref 3.87–5.11)
RDW: 14.4 % (ref 11.5–15.5)
WBC: 7.6 10*3/uL (ref 4.0–10.5)

## 2016-06-14 LAB — BASIC METABOLIC PANEL
ANION GAP: 12 (ref 5–15)
BUN: 10 mg/dL (ref 6–20)
CO2: 21 mmol/L — AB (ref 22–32)
Calcium: 8.5 mg/dL — ABNORMAL LOW (ref 8.9–10.3)
Chloride: 98 mmol/L — ABNORMAL LOW (ref 101–111)
Creatinine, Ser: 1.04 mg/dL — ABNORMAL HIGH (ref 0.44–1.00)
GFR, EST AFRICAN AMERICAN: 58 mL/min — AB (ref >60)
GFR, EST NON AFRICAN AMERICAN: 50 mL/min — AB (ref >60)
Glucose, Bld: 99 mg/dL (ref 65–99)
POTASSIUM: 3.6 mmol/L (ref 3.5–5.1)
Sodium: 131 mmol/L — ABNORMAL LOW (ref 135–145)

## 2016-06-14 LAB — URINALYSIS, ROUTINE W REFLEX MICROSCOPIC
BILIRUBIN URINE: NEGATIVE
Bacteria, UA: NONE SEEN
Glucose, UA: NEGATIVE mg/dL
HGB URINE DIPSTICK: NEGATIVE
Ketones, ur: NEGATIVE mg/dL
LEUKOCYTES UA: NEGATIVE
NITRITE: NEGATIVE
PH: 6 (ref 5.0–8.0)
Protein, ur: 30 mg/dL — AB
SPECIFIC GRAVITY, URINE: 1.015 (ref 1.005–1.030)

## 2016-06-14 LAB — ECHOCARDIOGRAM COMPLETE
AOASC: 31 cm
AVLVOTPG: 6 mmHg
CHL CUP DOP CALC LVOT VTI: 28.1 cm
CHL CUP TV REG PEAK VELOCITY: 312 cm/s
E decel time: 225 msec
E/e' ratio: 20.41
FS: 42 % (ref 28–44)
HEIGHTINCHES: 64 in
IV/PV OW: 0.92
LA diam index: 1.85 cm/m2
LA vol: 46.8 mL
LASIZE: 34 mm
LAVOLA4C: 50 mL
LAVOLIN: 25.4 mL/m2
LDCA: 2.01 cm2
LEFT ATRIUM END SYS DIAM: 34 mm
LV E/e' medial: 20.41
LV E/e'average: 20.41
LV PW d: 10.9 mm — AB (ref 0.6–1.1)
LV e' LATERAL: 6.32 cm/s
LVOT SV: 56 mL
LVOT peak vel: 127 cm/s
LVOTD: 16 mm
MV Dec: 225
MV pk E vel: 129 m/s
MVPG: 7 mmHg
MVPKAVEL: 101 m/s
RV LATERAL S' VELOCITY: 13.1 cm/s
RV sys press: 54 mmHg
TAPSE: 20 mm
TDI e' lateral: 6.32
TDI e' medial: 6.46
TR max vel: 312 cm/s
WEIGHTICAEL: 2758.4 [oz_av]

## 2016-06-14 LAB — GLUCOSE, CAPILLARY
GLUCOSE-CAPILLARY: 108 mg/dL — AB (ref 65–99)
Glucose-Capillary: 138 mg/dL — ABNORMAL HIGH (ref 65–99)
Glucose-Capillary: 190 mg/dL — ABNORMAL HIGH (ref 65–99)

## 2016-06-14 LAB — EXPECTORATED SPUTUM ASSESSMENT W REFEX TO RESP CULTURE

## 2016-06-14 LAB — LACTIC ACID, PLASMA
Lactic Acid, Venous: 1.8 mmol/L (ref 0.5–1.9)
Lactic Acid, Venous: 1.2 mmol/L (ref 0.5–1.9)

## 2016-06-14 LAB — INFLUENZA PANEL BY PCR (TYPE A & B)
INFLBPCR: POSITIVE — AB
Influenza A By PCR: NEGATIVE

## 2016-06-14 LAB — I-STAT CG4 LACTIC ACID, ED: Lactic Acid, Venous: 1.81 mmol/L (ref 0.5–1.9)

## 2016-06-14 LAB — EXPECTORATED SPUTUM ASSESSMENT W GRAM STAIN, RFLX TO RESP C: Special Requests: NORMAL

## 2016-06-14 LAB — MRSA PCR SCREENING: MRSA by PCR: POSITIVE — AB

## 2016-06-14 MED ORDER — PROMETHAZINE HCL 25 MG PO TABS
12.5000 mg | ORAL_TABLET | Freq: Four times a day (QID) | ORAL | Status: DC | PRN
  Filled 2016-06-14: qty 1

## 2016-06-14 MED ORDER — CLONAZEPAM 1 MG PO TABS
1.0000 mg | ORAL_TABLET | Freq: Two times a day (BID) | ORAL | Status: DC | PRN
  Administered 2016-06-14 – 2016-06-15 (×2): 1 mg via ORAL
  Filled 2016-06-14 (×2): qty 1

## 2016-06-14 MED ORDER — CHLORHEXIDINE GLUCONATE CLOTH 2 % EX PADS
6.0000 | MEDICATED_PAD | Freq: Every day | CUTANEOUS | Status: DC
  Administered 2016-06-14 – 2016-06-16 (×3): 6 via TOPICAL

## 2016-06-14 MED ORDER — TRIAMTERENE-HCTZ 37.5-25 MG PO CAPS
1.0000 | ORAL_CAPSULE | Freq: Every morning | ORAL | Status: DC
  Filled 2016-06-14: qty 1

## 2016-06-14 MED ORDER — TRIAMTERENE-HCTZ 37.5-25 MG PO CAPS
1.0000 | ORAL_CAPSULE | Freq: Every morning | ORAL | Status: DC
  Administered 2016-06-15 – 2016-06-16 (×2): 1 via ORAL
  Filled 2016-06-14 (×2): qty 1

## 2016-06-14 MED ORDER — IPRATROPIUM-ALBUTEROL 0.5-2.5 (3) MG/3ML IN SOLN
3.0000 mL | Freq: Four times a day (QID) | RESPIRATORY_TRACT | Status: DC
  Administered 2016-06-14 – 2016-06-15 (×5): 3 mL via RESPIRATORY_TRACT
  Filled 2016-06-14 (×5): qty 3

## 2016-06-14 MED ORDER — INSULIN ASPART 100 UNIT/ML ~~LOC~~ SOLN
0.0000 [IU] | Freq: Three times a day (TID) | SUBCUTANEOUS | Status: DC
  Administered 2016-06-14: 1 [IU] via SUBCUTANEOUS
  Administered 2016-06-14: 2 [IU] via SUBCUTANEOUS
  Administered 2016-06-15: 1 [IU] via SUBCUTANEOUS
  Administered 2016-06-15: 5 [IU] via SUBCUTANEOUS
  Administered 2016-06-15 – 2016-06-16 (×2): 1 [IU] via SUBCUTANEOUS

## 2016-06-14 MED ORDER — LEVOTHYROXINE SODIUM 125 MCG PO TABS
125.0000 ug | ORAL_TABLET | Freq: Every day | ORAL | Status: DC
  Administered 2016-06-14 – 2016-06-15 (×2): 125 ug via ORAL
  Filled 2016-06-14 (×2): qty 1

## 2016-06-14 MED ORDER — GUAIFENESIN ER 600 MG PO TB12
600.0000 mg | ORAL_TABLET | Freq: Two times a day (BID) | ORAL | Status: DC
  Administered 2016-06-14 – 2016-06-16 (×6): 600 mg via ORAL
  Filled 2016-06-14 (×6): qty 1

## 2016-06-14 MED ORDER — ALBUTEROL SULFATE (2.5 MG/3ML) 0.083% IN NEBU: 2.5000 mg | INHALATION_SOLUTION | RESPIRATORY_TRACT | Status: DC | PRN

## 2016-06-14 MED ORDER — ENOXAPARIN SODIUM 40 MG/0.4ML ~~LOC~~ SOLN
40.0000 mg | SUBCUTANEOUS | Status: DC
Start: 2016-06-14 — End: 2016-06-16
  Administered 2016-06-14 – 2016-06-16 (×3): 40 mg via SUBCUTANEOUS
  Filled 2016-06-14 (×3): qty 0.4

## 2016-06-14 MED ORDER — MUPIROCIN 2 % EX OINT
TOPICAL_OINTMENT | CUTANEOUS | Status: AC
  Filled 2016-06-14: qty 22

## 2016-06-14 MED ORDER — PANTOPRAZOLE SODIUM 40 MG PO TBEC
40.0000 mg | DELAYED_RELEASE_TABLET | Freq: Every day | ORAL | Status: DC
  Administered 2016-06-14 – 2016-06-16 (×3): 40 mg via ORAL
  Filled 2016-06-14 (×3): qty 1

## 2016-06-14 MED ORDER — PRAVASTATIN SODIUM 40 MG PO TABS
40.0000 mg | ORAL_TABLET | Freq: Every day | ORAL | Status: DC
  Administered 2016-06-14 – 2016-06-15 (×2): 40 mg via ORAL
  Filled 2016-06-14 (×2): qty 1

## 2016-06-14 MED ORDER — ACETAMINOPHEN 650 MG RE SUPP: 650.0000 mg | Freq: Four times a day (QID) | RECTAL | Status: DC | PRN

## 2016-06-14 MED ORDER — METHYLPREDNISOLONE SODIUM SUCC 40 MG IJ SOLR
40.0000 mg | Freq: Two times a day (BID) | INTRAMUSCULAR | Status: DC
  Administered 2016-06-14 – 2016-06-16 (×5): 40 mg via INTRAVENOUS
  Filled 2016-06-14 (×5): qty 1

## 2016-06-14 MED ORDER — SODIUM CHLORIDE 0.9 % IV SOLN
Freq: Once | INTRAVENOUS | Status: AC
  Administered 2016-06-14: 02:00:00 via INTRAVENOUS

## 2016-06-14 MED ORDER — ALBUTEROL SULFATE (2.5 MG/3ML) 0.083% IN NEBU: 2.5000 mg | INHALATION_SOLUTION | Freq: Four times a day (QID) | RESPIRATORY_TRACT | Status: DC

## 2016-06-14 MED ORDER — IPRATROPIUM BROMIDE 0.02 % IN SOLN: 0.5000 mg | Freq: Four times a day (QID) | RESPIRATORY_TRACT | Status: DC

## 2016-06-14 MED ORDER — ACETAMINOPHEN 325 MG PO TABS
650.0000 mg | ORAL_TABLET | Freq: Four times a day (QID) | ORAL | Status: DC | PRN
  Administered 2016-06-14: 650 mg via ORAL
  Filled 2016-06-14: qty 2

## 2016-06-14 MED ORDER — PROMETHAZINE HCL 25 MG/ML IJ SOLN: 12.5000 mg | Freq: Four times a day (QID) | INTRAMUSCULAR | Status: DC | PRN

## 2016-06-14 MED ORDER — MUPIROCIN 2 % EX OINT
1.0000 "application " | TOPICAL_OINTMENT | Freq: Two times a day (BID) | CUTANEOUS | Status: DC
  Administered 2016-06-14 – 2016-06-16 (×5): 1 via NASAL
  Filled 2016-06-14 (×2): qty 22

## 2016-06-14 MED ORDER — ONDANSETRON HCL 4 MG PO TABS: 4.0000 mg | ORAL_TABLET | Freq: Four times a day (QID) | ORAL | Status: DC | PRN

## 2016-06-14 MED ORDER — ONDANSETRON HCL 4 MG/2ML IJ SOLN
4.0000 mg | Freq: Four times a day (QID) | INTRAMUSCULAR | Status: DC | PRN
  Administered 2016-06-14: 4 mg via INTRAVENOUS
  Filled 2016-06-14: qty 2

## 2016-06-14 MED ORDER — FUROSEMIDE 10 MG/ML IJ SOLN
20.0000 mg | Freq: Once | INTRAMUSCULAR | Status: AC
  Administered 2016-06-14: 20 mg via INTRAVENOUS
  Filled 2016-06-14: qty 2

## 2016-06-14 MED ORDER — GABAPENTIN 600 MG PO TABS
600.0000 mg | ORAL_TABLET | Freq: Four times a day (QID) | ORAL | Status: DC
  Administered 2016-06-14 – 2016-06-16 (×9): 600 mg via ORAL
  Filled 2016-06-14 (×9): qty 1

## 2016-06-14 MED ORDER — INFLUENZA VAC SPLIT QUAD 0.5 ML IM SUSY
0.5000 mL | PREFILLED_SYRINGE | INTRAMUSCULAR | Status: DC
  Filled 2016-06-14: qty 0.5

## 2016-06-14 NOTE — Evaluation (Signed)
Physical Therapy Evaluation Patient Details Name: Shannon Higgins. Shannon Higgins MRN: [id number] DOB: [birth year] Today's Date: 06/14/2016   History of Present Illness  Patient is a 82 yo female admitted 06/13/16 with fever, cough, syncope x2.  Patient with Influenza B and hypotension.    PMH:  DM, HTN, HLD, anxiety, asthma, RA, OSA  Clinical Impression  Patient presents with problems listed below.  Will benefit from acute PT to maximize functional mobility prior to discharge to daughter's home.  Recommend HHPT for continued therapy for mobility, gait, and balance training.    Follow Up Recommendations Home health PT;Supervision for mobility/OOB    Equipment Recommendations  None recommended by PT    Recommendations for Other Services       Precautions / Restrictions Precautions Precautions: Fall Precaution Comments: syncope pta Restrictions Weight Bearing Restrictions: No      Mobility  Bed Mobility Overal bed mobility: Needs Assistance Bed Mobility: Supine to Sit;Sit to Supine     Supine to sit: Min assist;HOB elevated Sit to supine: Min guard   General bed mobility comments: Use of rail and min assist to raise trunk to sitting.  Transfers Overall transfer level: Needs assistance Equipment used: None Transfers: Sit to/from Stand Sit to Stand: Min guard         General transfer comment: Assist for safety  Ambulation/Gait Ambulation/Gait assistance: Min guard Ambulation Distance (Feet): 30 Feet Assistive device: None Gait Pattern/deviations: Step-through pattern;Decreased stride length Gait velocity: decreased Gait velocity interpretation: Below normal speed for age/gender General Gait Details: Patient with good gait pattern and balance.  Decreased gait speed.  No loss of balance during gait.  DOE.  Stairs            Wheelchair Mobility    Modified Rankin (Stroke Patients Only)       Balance Overall balance assessment: Needs assistance         Standing  balance support: No upper extremity supported Standing balance-Leahy Scale: Good                               Pertinent Vitals/Pain Pain Assessment: Faces Faces Pain Scale: Hurts a little bit Pain Location: Head Pain Descriptors / Indicators: Headache Pain Intervention(s): Monitored during session    Home Living Family/patient expects to be discharged to:: Private residence Living Arrangements: Children (Daughter and son-in-law) Available Help at Discharge: Family Type of Home: House Home Access: Stairs to enter Entrance Stairs-Rails: Psychiatric nurse of Steps: 6-8 Home Layout: Two level;Able to live on main level with bedroom/bathroom Home Equipment: Walker - 2 wheels;Transport chair;Bedside commode      Prior Function Level of Independence: Independent         Comments: Recent shoulder surgery.  Does not drive.     Hand Dominance   Dominant Hand: Right    Extremity/Trunk Assessment   Upper Extremity Assessment Upper Extremity Assessment: Generalized weakness (Recent Rt shoulder surgery - good ROM)    Lower Extremity Assessment Lower Extremity Assessment: Generalized weakness (Peripheral neuropathy)       Communication   Communication: No difficulties  Cognition Arousal/Alertness: Awake/alert Behavior During Therapy: WFL for tasks assessed/performed Overall Cognitive Status: Within Functional Limits for tasks assessed                      General Comments      Exercises     Assessment/Plan    PT Assessment Patient needs continued PT  services  PT Problem List Decreased strength;Decreased activity tolerance;Decreased balance;Decreased mobility;Decreased knowledge of use of DME;Cardiopulmonary status limiting activity;Pain          PT Treatment Interventions DME instruction;Gait training;Stair training;Functional mobility training;Therapeutic activities;Therapeutic exercise;Patient/family education;Balance  training    PT Goals (Current goals can be found in the Care Plan section)  Acute Rehab PT Goals Patient Stated Goal: To get stronger PT Goal Formulation: With patient/family Time For Goal Achievement: 06/21/16 Potential to Achieve Goals: Good    Frequency Min 3X/week   Barriers to discharge Inaccessible home environment 6-8 stairs to enter home    Co-evaluation               End of Session Equipment Utilized During Treatment: Gait belt Activity Tolerance: Patient limited by fatigue Patient left: in bed;with call bell/phone within reach;with bed alarm set;with family/visitor present Nurse Communication: Mobility status         Time: 0762-2633 PT Time Calculation (min) (ACUTE ONLY): 19 min   Charges:   PT Evaluation $PT Eval Moderate Complexity: 1 Procedure     PT G Codes:        Despina Pole 22-Jun-2016, 3:13 PM Carita Pian. Sanjuana Kava, [place] Pager [phone]

## 2016-06-14 NOTE — Consult Note (Signed)
PULMONARY / CRITICAL CARE MEDICINE   Name: Shannon Higgins. Shannon Higgins MRN: [id number] DOB: 01-25-1935    ADMISSION DATE:  06/13/2016 CONSULTATION DATE:  06/13/2016  REFERRING MD:  Dr. Zenia Resides EDP  CHIEF COMPLAINT:  Hypotension  HISTORY OF PRESENT ILLNESS:   82 year old female with PMH as below, which is significant for DM, astha, GERD, HTN, RA, and OSA. She presented to [facility] ED 2/12 with chief complaints of cough and fever (103) starting 12/11. Also complaining of body aches and cough. She was at home 2/12 and had an episode of unresponsiveness while attempting to have a bowel movement. She woke up while EMS was there and refused transportation to the ED. Her daughter then drove her into the ED where she lost consciousness in the waiting room and was rushed back. She was found to be hypotensive and given 4L IVF which her blood pressure responded to well. Due to hypotension, PCCM asked to see.   PAST MEDICAL HISTORY :  She  has a past medical history of Arthritis; Asthma; Diabetes mellitus without complication ([place]); GERD (gastroesophageal reflux disease); History of recurrent UTIs; Hypertension; Migraine; Neuropathic pain; Sinusitis; Sleep apnea; and Thyroid disease.  PAST SURGICAL HISTORY: She  has a past surgical history that includes Back surgery; Knee surgery; Cholecystectomy; Abdominal hysterectomy; and Shoulder surgery.  No Known Allergies  No current facility-administered medications on file prior to encounter.    Current Outpatient Prescriptions on File Prior to Encounter  Medication Sig  . albuterol (PROVENTIL HFA;VENTOLIN HFA) 108 (90 BASE) MCG/ACT inhaler Inhale 1 puff into the lungs 4 (four) times daily.  . clonazePAM (KLONOPIN) 1 MG tablet Take 1 mg by mouth 2 (two) times daily as needed.   . gabapentin (NEURONTIN) 600 MG tablet Take 600 mg by mouth 4 (four) times daily.  [name] levothyroxine (SYNTHROID, LEVOTHROID) 125 MCG tablet Take 125 mcg by mouth at bedtime.  . meloxicam (MOBIC)  15 MG tablet Take 15 mg by mouth daily.  . metFORMIN (GLUCOPHAGE) 500 MG tablet Take 500 mg by mouth 2 (two) times daily with a meal.  . omeprazole (PRILOSEC) 20 MG capsule Take 20 mg by mouth 2 (two) times daily.  . pravastatin (PRAVACHOL) 40 MG tablet Take 40 mg by mouth at bedtime.  . triamterene-hydrochlorothiazide (DYAZIDE) 37.5-25 MG per capsule Take 1 capsule by mouth every morning.  . predniSONE (STERAPRED UNI-PAK) 10 MG tablet Take 1 tablet (10 mg total) by mouth See admin instructions. (Patient not taking: Reported on 06/13/2016)    FAMILY HISTORY:  Her indicated that both of her daughters are alive.    SOCIAL HISTORY: She  reports that she has never smoked. She has never used smokeless tobacco. She reports that she does not drink alcohol or use drugs.  REVIEW OF SYSTEMS:   Bolds are positive  Constitutional: weight loss, gain, night sweats, Fevers, chills, fatigue .  HEENT: headaches, Sore throat, sneezing, nasal congestion, post nasal drip, Difficulty swallowing, Tooth/dental problems, visual complaints visual changes, ear ache CV:  chest pain, radiates:,Orthopnea, PND, swelling in lower extremities, dizziness, palpitations, syncope.  GI  heartburn, indigestion, abdominal pain, nausea, vomiting, diarrhea, change in bowel habits, loss of appetite, bloody stools.  Resp: cough, productive:green thick sputum , hemoptysis, dyspnea, chest pain, pleuritic.  Skin: rash or itching or icterus GU: dysuria, change in color of urine, urgency or frequency. flank pain, hematuria malodorous urine MS: joint pain or swelling. decreased range of motion  Psych: change in mood or affect. depression or anxiety.  Neuro: difficulty with speech, weakness, numbness, ataxia    SUBJECTIVE:    VITAL SIGNS: BP (!) 77/54   Pulse 94   Temp 98.6 F (37 C) (Rectal)   Resp 18   Ht 5' 4.5" (1.638 m)   Wt 72.6 kg (160 lb)   SpO2 100%   BMI 27.04 kg/m   HEMODYNAMICS:    VENTILATOR SETTINGS:     INTAKE / OUTPUT: No intake/output data recorded.  PHYSICAL EXAMINATION: General:  Obese elderly female in NAD Neuro:  Awake, alert, oriented, non-focal HEENT:  [place]/AT, PERRL, L eye protruding somewhat more than R Cardiovascular:  RRR, no MRG Lungs:  Clear Abdomen:  Soft, non-tender, non-distended. Normoactive BS Musculoskeletal:  No acute deformity or ROM limitation Skin:  Grossly intact  LABS:  BMET  Recent Labs Lab 06/13/16 2048  NA 128*  K 3.8  CL 92*  CO2 22  BUN 13  CREATININE 1.32*  GLUCOSE 167*    Electrolytes  Recent Labs Lab 06/13/16 2048  CALCIUM 9.2    CBC  Recent Labs Lab 06/13/16 2048  WBC 9.1  HGB 12.1  HCT 37.6  PLT 292    Coag's No results for input(s): APTT, INR in the last 168 hours.  Sepsis Markers  Recent Labs Lab 06/13/16 2103  LATICACIDVEN 3.05*    ABG No results for input(s): PHART, PCO2ART, PO2ART in the last 168 hours.  Liver Enzymes  Recent Labs Lab 06/13/16 2048  AST 33  ALT 20  ALKPHOS 92  BILITOT 0.8  ALBUMIN 3.9    Cardiac Enzymes No results for input(s): TROPONINI, PROBNP in the last 168 hours.  Glucose No results for input(s): GLUCAP in the last 168 hours.  Imaging Dg Chest 2 View  Result Date: 06/13/2016 CLINICAL DATA:  Cough and cold since Sunday. EXAM: CHEST  2 VIEW COMPARISON:  04/27/2012 FINDINGS: Heart is top normal and stable in appearance with aortic atherosclerosis. Mild chronic interstitial prominence is noted bilaterally with mild peribronchial thickening suggestive of bronchitic change. No pneumonic consolidation, CHF nor effusion. No pneumothorax. Bilateral shoulder arthroplasties. Surgical clips project over the left lateral chest wall. IMPRESSION: Stable cardiomegaly with aortic atherosclerosis. Mild bronchitic change of the lungs. Electronically Signed   By: Ashley Royalty M.D.   On: 06/13/2016 21:22     STUDIES:  CXR on admit non-acute  CULTURES: Blood x 2 2/12 >>> Flu 2/12 >  Flu B positive  ANTIBIOTICS: Zosyn 2/12 >>> Tamiflu 2/12 >>>   SIGNIFICANT EVENTS:   LINES/TUBES:   DISCUSSION:   ASSESSMENT / PLAN:  PULMONARY A: OSA - noncompliant with CPAP Asthma without acute exacerbation  P:   Pulmonary hygiene PRN albuterol  CARDIOVASCULAR A:  Hypotension secondary to severe sepsis vs hypovolemia H/o HTN  P:  Continued IVF resuscitation (4L in ED) May need pressors if BP worsens again Holding home dyazide Continue home pravastatin Lactic cleared  RENAL A:   AKI Hyponatremia  P:   Follow BMP Follow UOP  GASTROINTESTINAL A:   GERD  P:   Carb modified diet  HEMATOLOGIC A:   No acute issues  P:  Follow CBC  INFECTIOUS A:   Flu B positive ?UTI  P:   UA pending ABX/tamiflu as above Cultures as above  ENDOCRINE A:   DM  P:   Hold metformin as AKI CBG monitoring and SSI  NEUROLOGIC A:   Acute metabolic encephalopathy (improved) P:   RASS goal: 0   FAMILY  - Updates: Daughter updated in ED  -  Inter-disciplinary family meet or Palliative Care meeting due by:  2/29   Georgann Housekeeper, AGACNP-BC [facility] Pager [phone] or [phone]  06/14/2016 12:46 AM

## 2016-06-14 NOTE — Progress Notes (Signed)
PROGRESS NOTE        PATIENT DETAILS Name: Shannon Higgins. Selner Age: 82 y.o. Sex: female Date of Birth: [birth year] Admit Date: 06/13/2016 Admitting Physician Norval Morton, MD PCP:Pcp Not In System  Brief Narrative: Patient is a 82 y.o. female with history of type 2 diabetes, hypothyroidism, hypertension, dyslipidemia who presented to the hospital with fever, cough, and 2 episodes of syncope. She was initially hypotensive in the emergency room, and required IV fluid boluses. Further evaluation was positive for influenza B PCR. She was subsequently admitted for further evaluation and treatment. See below for further details.   Subjective: Feels much better-but not yet back to her baseline. Blood pressure is now stable. She continues to cough  Assessment/Plan: Sepsis secondary to influenza: Sepsis pathophysiology has resolved, she is now persistently normotensive. Continue Tamiflu, if blood cultures are negative, suspect we could discontinue vancomycin and Zosyn.  Acute bronchitis: Wheezing today, suspect she has acute bronchitis due to influenza. Continue bronchodilators, add short course of steroids. Follow clinical course.  Syncope: Hypotensive on presentation-suspect syncope related to orthostatic mechanism/volume issues. Continue to monitor in telemetry, check echo.  Acute kidney injury: Likely hemodynamically mediated acute kidney injury-resolved with hydration.  Hyponatremia: Improving with fluids. Stable for monitoring without any further workup. Euvolemic on exam  DM-2: CBGs stable with SSI-continue to hold antihypertensives.  Essential hypertension: Blood pressure on the higher side-cautiously resume Maxide.  Dyslipidemia: Continue statin  Hypothyroidism: Continue levothyroxine  Anxiety: Continue Klonopin  Deconditioning/generalized weakness: Likely secondary to acute illness-obtain PT evaluation.   DVT Prophylaxis: Prophylactic Lovenox    Code Status: Full code   Family Communication: Daughter at bedside  Disposition Plan: Remain inpatient-but transfer to telemetry-may require home health services on discharge.  Antimicrobial agents: Anti-infectives    Start     Dose/Rate Route Frequency Ordered Stop   06/14/16 1200  vancomycin (VANCOCIN) IVPB 1000 mg/200 mL premix     1,000 mg 200 mL/hr over 60 Minutes Intravenous Every 24 hours 06/13/16 2211     06/14/16 0500  piperacillin-tazobactam (ZOSYN) IVPB 3.375 g     3.375 g 12.5 mL/hr over 240 Minutes Intravenous Every 8 hours 06/13/16 2209     06/13/16 2215  oseltamivir (TAMIFLU) 6 MG/ML suspension 30 mg     30 mg Oral 2 times daily 06/13/16 2201 06/18/16 2159   06/13/16 2200  piperacillin-tazobactam (ZOSYN) IVPB 3.375 g     3.375 g 100 mL/hr over 30 Minutes Intravenous  Once 06/13/16 2159 06/13/16 2253   06/13/16 2200  vancomycin (VANCOCIN) IVPB 1000 mg/200 mL premix     1,000 mg 200 mL/hr over 60 Minutes Intravenous  Once 06/13/16 2159 06/13/16 2335      Procedures: Echo 2/13>> The right ventricular systolic pressure was increased consistent   with moderate pulmonary hypertension. EF 60-65%, with grade 2 diastolic dysfunction.  CONSULTS: PCCM  Time spent: 30 minutes-Greater than 50% of this time was spent in counseling, explanation of diagnosis, planning of further management, and coordination of care.  MEDICATIONS: Scheduled Meds: . Chlorhexidine Gluconate Cloth  6 each Topical Q0600  . enoxaparin (LOVENOX) injection  40 mg Subcutaneous Q24H  . gabapentin  600 mg Oral QID  . guaiFENesin  600 mg Oral BID  . [START ON 06/15/2016] Influenza vac split quadrivalent PF  0.5 mL Intramuscular Tomorrow-1000  . insulin aspart  0-9 Units Subcutaneous  TID WC  . ipratropium-albuterol  3 mL Nebulization Q6H  . levothyroxine  125 mcg Oral QHS  . methylPREDNISolone (SOLU-MEDROL) injection  40 mg Intravenous Q12H  . mupirocin ointment  1 application Nasal BID  .  mupirocin ointment      . oseltamivir  30 mg Oral BID  . pantoprazole  40 mg Oral Daily  . piperacillin-tazobactam (ZOSYN)  IV  3.375 g Intravenous Q8H  . pravastatin  40 mg Oral QHS  . [START ON 06/15/2016] triamterene-hydrochlorothiazide  1 capsule Oral q morning - 10a  . vancomycin  1,000 mg Intravenous Q24H   Continuous Infusions: PRN Meds:.acetaminophen **OR** acetaminophen, albuterol, clonazePAM, ondansetron **OR** ondansetron (ZOFRAN) IV   PHYSICAL EXAM: Vital signs: Vitals:   06/14/16 0620 06/14/16 0811 06/14/16 0900 06/14/16 1000  BP:  (!) 149/76 121/89 122/84  Pulse:  82 76 77  Resp: (!) 24 16 16 18   Temp:      TempSrc:      SpO2: 93% 98% 98% 97%  Weight:      Height:       Filed Weights   06/13/16 2047 06/14/16 0200  Weight: 72.6 kg (160 lb) 78.2 kg (172 lb 6.4 oz)   Body mass index is 29.59 kg/m.   General appearance :Awake, alert, not in any distress. Speech Clear. Not toxic Looking Eyes:, pupils equally reactive to light and accomodation,no scleral icterus.Pink conjunctiva HEENT: Atraumatic and Normocephalic Neck: supple, no JVD. No cervical lymphadenopathy. No thyromegaly Resp:Good air entry bilaterally, Some wheezing bilaterally CVS: S1 S2 regular, no murmurs.  GI: Bowel sounds present, Non tender and not distended with no gaurding, rigidity or rebound.No organomegaly Extremities: B/L Lower Ext shows no edema, both legs are warm to touch Neurology:  speech clear,Non focal, sensation is grossly intact. Psychiatric: Normal judgment and insight. Alert and oriented x 3. Normal mood. Musculoskeletal:No digital cyanosis Skin:No Rash, warm and dry Wounds:N/A  I have personally reviewed following labs and imaging studies  LABORATORY DATA: CBC:  Recent Labs Lab 06/13/16 2048 06/14/16 0329  WBC 9.1 7.6  NEUTROABS 6.1  --   HGB 12.1 12.3  HCT 37.6 38.2  MCV 85.6 85.5  PLT 292 284    Basic Metabolic Panel:  Recent Labs Lab 06/13/16 2048  06/14/16 0329  NA 128* 131*  K 3.8 3.6  CL 92* 98*  CO2 22 21*  GLUCOSE 167* 99  BUN 13 10  CREATININE 1.32* 1.04*  CALCIUM 9.2 8.5*    GFR: Estimated Creatinine Clearance: 44.4 mL/min (by C-G formula based on SCr of 1.04 mg/dL (H)).  Liver Function Tests:  Recent Labs Lab 06/13/16 2048  AST 33  ALT 20  ALKPHOS 92  BILITOT 0.8  PROT 6.8  ALBUMIN 3.9   No results for input(s): LIPASE, AMYLASE in the last 168 hours. No results for input(s): AMMONIA in the last 168 hours.  Coagulation Profile: No results for input(s): INR, PROTIME in the last 168 hours.  Cardiac Enzymes: No results for input(s): CKTOTAL, CKMB, CKMBINDEX, TROPONINI in the last 168 hours.  BNP (last 3 results) No results for input(s): PROBNP in the last 8760 hours.  HbA1C: No results for input(s): HGBA1C in the last 72 hours.  CBG:  Recent Labs Lab 06/14/16 0842  GLUCAP 108*    Lipid Profile: No results for input(s): CHOL, HDL, LDLCALC, TRIG, CHOLHDL, LDLDIRECT in the last 72 hours.  Thyroid Function Tests: No results for input(s): TSH, T4TOTAL, FREET4, T3FREE, THYROIDAB in the last 72 hours.  Anemia  Panel: No results for input(s): VITAMINB12, FOLATE, FERRITIN, TIBC, IRON, RETICCTPCT in the last 72 hours.  Urine analysis:    Component Value Date/Time   COLORURINE YELLOW 06/13/2016 0041   APPEARANCEUR CLEAR 06/13/2016 0041   LABSPEC 1.015 06/13/2016 0041   PHURINE 6.0 06/13/2016 0041   GLUCOSEU NEGATIVE 06/13/2016 0041   HGBUR NEGATIVE 06/13/2016 0041   BILIRUBINUR NEGATIVE 06/13/2016 0041   KETONESUR NEGATIVE 06/13/2016 0041   PROTEINUR 30 (A) 06/13/2016 0041   UROBILINOGEN 0.2 04/26/2012 1547   NITRITE NEGATIVE 06/13/2016 0041   LEUKOCYTESUR NEGATIVE 06/13/2016 0041    Sepsis Labs: Lactic Acid, Venous    Component Value Date/Time   LATICACIDVEN 1.2 06/14/2016 0449    MICROBIOLOGY: Recent Results (from the past 240 hour(s))  MRSA PCR Screening     Status: Abnormal    Collection Time: 06/14/16  4:24 AM  Result Value Ref Range Status   MRSA by PCR POSITIVE (A) NEGATIVE Final    Comment:        The GeneXpert MRSA Assay (FDA approved for NASAL specimens only), is one component of a comprehensive MRSA colonization surveillance program. It is not intended to diagnose MRSA infection nor to guide or monitor treatment for MRSA infections. RESULT CALLED TO, READ BACK BY AND VERIFIED WITH: DOrson Slick [phone] N.MORRIS     RADIOLOGY STUDIES/RESULTS: Dg Chest 2 View  Result Date: 06/13/2016 CLINICAL DATA:  Cough and cold since Sunday. EXAM: CHEST  2 VIEW COMPARISON:  04/27/2012 FINDINGS: Heart is top normal and stable in appearance with aortic atherosclerosis. Mild chronic interstitial prominence is noted bilaterally with mild peribronchial thickening suggestive of bronchitic change. No pneumonic consolidation, CHF nor effusion. No pneumothorax. Bilateral shoulder arthroplasties. Surgical clips project over the left lateral chest wall. IMPRESSION: Stable cardiomegaly with aortic atherosclerosis. Mild bronchitic change of the lungs. Electronically Signed   By: Ashley Royalty M.D.   On: 06/13/2016 21:22   Dg Chest Port 1v Same Day  Result Date: 06/14/2016 CLINICAL DATA:  82 year old female with shortness breath. Right shoulder pain. Subsequent encounter. EXAM: PORTABLE CHEST 1 VIEW COMPARISON:  06/13/2016 and 04/27/2012. FINDINGS: Portable chest x-ray with lordotic technique. Central pulmonary vascular prominence. Minimal peribronchial thickening without segmental consolidation. No plain film evidence of pulmonary malignancy. Heart size top-normal. Post bilateral shoulder replacement. IMPRESSION: Minimal peribronchial thickening without segmental consolidation. Central pulmonary vascular prominence. Electronically Signed   By: Genia Del M.D.   On: 06/14/2016 07:50     LOS: 0 days   Oren Binet, MD  [facility] Pager:[phone]  If 7PM-7AM, please  contact night-coverage [email or web address] Password [email or web address] 06/14/2016, 11:02 AM

## 2016-06-14 NOTE — Progress Notes (Signed)
  Echocardiogram 2D Echocardiogram has been performed.  Shannon Higgins 06/14/2016, 9:01 AM

## 2016-06-14 NOTE — H&P (Signed)
History and Physical    Shannon Higgins. [place] Dec OMB:[id number] DOB: 06/25/1934 DOA: 06/13/2016  Referring MD/NP/PA: Antonietta Breach, PA-C PCP: Pcp Not In System  Patient coming from:   Chief Complaint: Cough and fever  HPI: Shannon Higgins is a 82 y.o. female with medical history significant of HTN, asthma, DM type II, RA, OSA not on CPAP; presents with complaints of cough and fever up to 103F over the last 2 days. She notes that she's had associated symptoms of chills, productive cough with greenish sputum production, and headache. She did not receive her flu vaccine this year. Denies having any nausea, vomiting, diarrhea, chest pain, abdominal pain, or recent sick contacts to her knowledge. Patient was noted to have episode of unresponsiveness yesterday afternoon while having bowel movement. EMS was called present, the patient but refused transport at that time. Her daughter picked her up and brought her to [facility].    ED Course: Upon admission into the emergency department the patient was noted to have lost cousicioness while in the waiting room. She was found to be hypotensive with BP as low as 55/37. She was given 4 L of normal saline IV fluids with improvement of blood pressures. PCCM  Was consulted and evaluated the patient due to hypotension, but blood pressures were noted to be stable. Initial evaluation revealed positive influenza screen. Patient was started on Tamiflu.  Review of Systems: As per HPI otherwise 10 point review of systems negative.   Past Medical History:  Diagnosis Date  . Arthritis   . Asthma   . Diabetes mellitus without complication ([place])   . GERD (gastroesophageal reflux disease)   . History of recurrent UTIs   . Hypertension   . Migraine   . Neuropathic pain   . Sinusitis   . Sleep apnea   . Thyroid disease     Past Surgical History:  Procedure Laterality Date  . ABDOMINAL HYSTERECTOMY    . BACK SURGERY    . CHOLECYSTECTOMY    . KNEE SURGERY    .  SHOULDER SURGERY       reports that she has never smoked. She has never used smokeless tobacco. She reports that she does not drink alcohol or use drugs.  No Known Allergies  Family History  Problem Relation Age of Onset  . Diabetes Daughter   . Hypertension Daughter   . Fibromyalgia Daughter   . GER disease Daughter   . Fibromyalgia Daughter   . Crohn's disease Daughter   . Asthma Daughter     Prior to Admission medications   Medication Sig Start Date End Date Taking? Authorizing Provider  albuterol (PROVENTIL HFA;VENTOLIN HFA) 108 (90 BASE) MCG/ACT inhaler Inhale 1 puff into the lungs 4 (four) times daily. 04/29/12  Yes Sorin June Leap, MD  clonazePAM (KLONOPIN) 1 MG tablet Take 1 mg by mouth 2 (two) times daily as needed.    Yes Historical Provider, MD  gabapentin (NEURONTIN) 600 MG tablet Take 600 mg by mouth 4 (four) times daily.   Yes Historical Provider, MD  levothyroxine (SYNTHROID, LEVOTHROID) 125 MCG tablet Take 125 mcg by mouth at bedtime.   Yes Historical Provider, MD  meloxicam (MOBIC) 15 MG tablet Take 15 mg by mouth daily.   Yes Historical Provider, MD  metFORMIN (GLUCOPHAGE) 500 MG tablet Take 500 mg by mouth 2 (two) times daily with a meal.   Yes Historical Provider, MD  omeprazole (PRILOSEC) 20 MG capsule Take 20 mg by mouth 2 (two) times daily.  Yes Historical Provider, MD  pravastatin (PRAVACHOL) 40 MG tablet Take 40 mg by mouth at bedtime.   Yes Historical Provider, MD  triamterene-hydrochlorothiazide (DYAZIDE) 37.5-25 MG per capsule Take 1 capsule by mouth every morning.   Yes Historical Provider, MD  predniSONE (STERAPRED UNI-PAK) 10 MG tablet Take 1 tablet (10 mg total) by mouth See admin instructions. Patient not taking: Reported on 06/13/2016 04/29/12   Sorin June Leap, MD    Physical Exam:    Constitutional: Elderly female who appears sick , but able to follow commands. Vitals:   06/13/16 2320 06/14/16 0000 06/14/16 0015 06/14/16 0030  BP: (!) 77/54 95/79  112/59 137/58  Pulse: 94 76 70 78  Resp: 18 20 21 25   Temp:      TempSrc:      SpO2: 100% 97% 99% 99%  Weight:      Height:       Eyes: PERRL, lids and conjunctivae normal ENMT: Mucous membranes are dry. Posterior pharynx clear of any exudate or lesions.  Neck: normal, supple, no masses, no thyromegaly Respiratory: Mildly tachypnea with expiratory wheezes and crackles appreciated. Cardiovascular: Regular rate and rhythm, no murmurs / rubs / gallops. No extremity edema. 2+ pedal pulses. No carotid bruits.  Abdomen: no tenderness, no masses palpated. No hepatosplenomegaly. Bowel sounds positive.  Musculoskeletal: no clubbing / cyanosis. No joint deformity upper and lower extremities. Good ROM, no contractures. Normal muscle tone.  Skin: no rashes, lesions, ulcers. No induration Neurologic: CN 2-12 grossly intact. Sensation intact, DTR normal. Strength 5/5 in all 4.  Psychiatric: Normal judgment and insight. Alert and oriented x 3. Normal mood.     Labs on Admission: I have personally reviewed following labs and imaging studies  CBC:  Recent Labs Lab 06/13/16 2048  WBC 9.1  NEUTROABS 6.1  HGB 12.1  HCT 37.6  MCV 85.6  PLT 585   Basic Metabolic Panel:  Recent Labs Lab 06/13/16 2048  NA 128*  K 3.8  CL 92*  CO2 22  GLUCOSE 167*  BUN 13  CREATININE 1.32*  CALCIUM 9.2   GFR: Estimated Creatinine Clearance: 34.2 mL/min (by C-G formula based on SCr of 1.32 mg/dL (H)). Liver Function Tests:  Recent Labs Lab 06/13/16 2048  AST 33  ALT 20  ALKPHOS 92  BILITOT 0.8  PROT 6.8  ALBUMIN 3.9   No results for input(s): LIPASE, AMYLASE in the last 168 hours. No results for input(s): AMMONIA in the last 168 hours. Coagulation Profile: No results for input(s): INR, PROTIME in the last 168 hours. Cardiac Enzymes: No results for input(s): CKTOTAL, CKMB, CKMBINDEX, TROPONINI in the last 168 hours. BNP (last 3 results) No results for input(s): PROBNP in the last 8760  hours. HbA1C: No results for input(s): HGBA1C in the last 72 hours. CBG: No results for input(s): GLUCAP in the last 168 hours. Lipid Profile: No results for input(s): CHOL, HDL, LDLCALC, TRIG, CHOLHDL, LDLDIRECT in the last 72 hours. Thyroid Function Tests: No results for input(s): TSH, T4TOTAL, FREET4, T3FREE, THYROIDAB in the last 72 hours. Anemia Panel: No results for input(s): VITAMINB12, FOLATE, FERRITIN, TIBC, IRON, RETICCTPCT in the last 72 hours. Urine analysis:    Component Value Date/Time   COLORURINE YELLOW 06/13/2016 0041   APPEARANCEUR CLEAR 06/13/2016 0041   LABSPEC 1.015 06/13/2016 0041   PHURINE 6.0 06/13/2016 0041   GLUCOSEU NEGATIVE 06/13/2016 0041   HGBUR NEGATIVE 06/13/2016 0041   BILIRUBINUR NEGATIVE 06/13/2016 0041   KETONESUR NEGATIVE 06/13/2016 0041   PROTEINUR  30 (A) 06/13/2016 0041   UROBILINOGEN 0.2 04/26/2012 1547   NITRITE NEGATIVE 06/13/2016 0041   LEUKOCYTESUR NEGATIVE 06/13/2016 0041   Sepsis Labs: No results found for this or any previous visit (from the past 240 hour(s)).   Radiological Exams on Admission: Dg Chest 2 View  Result Date: 06/13/2016 CLINICAL DATA:  Cough and cold since Sunday. EXAM: CHEST  2 VIEW COMPARISON:  04/27/2012 FINDINGS: Heart is top normal and stable in appearance with aortic atherosclerosis. Mild chronic interstitial prominence is noted bilaterally with mild peribronchial thickening suggestive of bronchitic change. No pneumonic consolidation, CHF nor effusion. No pneumothorax. Bilateral shoulder arthroplasties. Surgical clips project over the left lateral chest wall. IMPRESSION: Stable cardiomegaly with aortic atherosclerosis. Mild bronchitic change of the lungs. Electronically Signed   By: Ashley Royalty M.D.   On: 06/13/2016 21:22    EKG: Independently reviewed. Normal sinus rhythm  Assessment/Plan SIRS secondary to influenza B: Acute.Patient presents with symptoms suggestive of influenza. Initial lactic acid elevated  at 3.05 and patient was noted to be tachypneic up to 27. Patient found to be positive for influenza B. - Admit to stepdown - Follow-up blood and sputum cultures - Continue Tamiflu - Empiric antibiotics of vancomycin and Zosyn, de-escalate when able - Continuous pulse oximetry with nasal cannula oxygen as needed keep O2 sats greater than 92% - DuoNeb's every 6 hours with prn Albuterol nebs q2hr - mucinex  Transient hypotension: Resolved after patient given 4 L of IV fluids in the ED. - Hold IV fluids at this time secondary to increase pulmonary congestion on physical exam   Acute kidney injury: Baseline creatinine previously noted to be 0.67 back in 2013. Patient presents with elevated creatinine of 1.32 and BUN 13. Note patient was on diuretic. Patient was initially given 4 L of normal saline fluids. - Recheck BMP in a.m.  Diabetes mellitus type 2 - Hypoglycemic protocols - Hold metformin    - CBGs every before meals with sensitive SSI  Essential hypertension - Restart triamterene-HCTZ in a.m  Hyperlipidemia  - Continue pravastatin    Hypothyroidism  - Continue levothyroxine    Anxiety - Klonopin prn anxiety  DVT prophylaxis: lovenox   Code Status: Full Family Communication: Discussed plan of care with patient and daughter who is present at bedside Disposition Plan: Likely discharge home once medically stable. Consults called: None Admission status: Inpatient   Norval Morton MD [facility] Pager [phone]  If 7PM-7AM, please contact night-coverage [email or web address] Password [email or web address]  06/14/2016, 1:24 AM

## 2016-06-14 NOTE — Progress Notes (Signed)
Patient's daughter called out stating that patient could not breathe.  Upon assessment, patient had expiratory wheezing with sats in mid 90s and RR 16.  Patient repositioned and placed on 2L [place] for comfort.  Patient stated relief of symptoms.  Will continue to monitor.

## 2016-06-14 NOTE — Progress Notes (Signed)
Pt transferred via SWAT RN to [phone]. Family at bedside and aware of plan. Report given to [place], Therapist, sports.

## 2016-06-15 DIAGNOSIS — A419 Sepsis, unspecified organism: Secondary | ICD-10-CM | POA: Diagnosis not present

## 2016-06-15 DIAGNOSIS — J101 Influenza due to other identified influenza virus with other respiratory manifestations: Secondary | ICD-10-CM | POA: Diagnosis not present

## 2016-06-15 DIAGNOSIS — J209 Acute bronchitis, unspecified: Secondary | ICD-10-CM | POA: Diagnosis not present

## 2016-06-15 DIAGNOSIS — E871 Hypo-osmolality and hyponatremia: Secondary | ICD-10-CM | POA: Diagnosis not present

## 2016-06-15 DIAGNOSIS — N179 Acute kidney failure, unspecified: Secondary | ICD-10-CM | POA: Diagnosis not present

## 2016-06-15 LAB — GLUCOSE, CAPILLARY
GLUCOSE-CAPILLARY: 169 mg/dL — AB (ref 65–99)
Glucose-Capillary: 139 mg/dL — ABNORMAL HIGH (ref 65–99)
Glucose-Capillary: 122 mg/dL — ABNORMAL HIGH (ref 65–99)
Glucose-Capillary: 274 mg/dL — ABNORMAL HIGH (ref 65–99)

## 2016-06-15 LAB — BASIC METABOLIC PANEL
Anion gap: 10 (ref 5–15)
BUN: 9 mg/dL (ref 6–20)
CALCIUM: 9 mg/dL (ref 8.9–10.3)
CHLORIDE: 94 mmol/L — AB (ref 101–111)
CO2: 28 mmol/L (ref 22–32)
Creatinine, Ser: 0.89 mg/dL (ref 0.44–1.00)
GFR calc Af Amer: >60 mL/min (ref >60)
GFR calc non Af Amer: >60 mL/min (ref >60)
GLUCOSE: 182 mg/dL — AB (ref 65–99)
Potassium: 4.1 mmol/L (ref 3.5–5.1)
Sodium: 132 mmol/L — ABNORMAL LOW (ref 135–145)

## 2016-06-15 MED ORDER — IPRATROPIUM-ALBUTEROL 0.5-2.5 (3) MG/3ML IN SOLN
3.0000 mL | Freq: Two times a day (BID) | RESPIRATORY_TRACT | Status: DC
  Administered 2016-06-15 – 2016-06-16 (×2): 3 mL via RESPIRATORY_TRACT
  Filled 2016-06-15 (×2): qty 3

## 2016-06-15 NOTE — Care Management Note (Addendum)
Case Management Note  Patient Details  Name: Shannon Higgins. Shannon Higgins MRN: [id number] Date of Birth: [birth year]  Subjective/Objective:     CM following for progression and d/c planning.                Action/Plan: 06/15/2016 Met with pt re d/c planning, pt agreeable to HHPT. Pt currently living with daughter. Please note pt current address is [address] [place], [place] [address]. She will discuss [employer] agency options with her daughter and will make a decision by tomorrow.  06/16/2016 Met with pt who has discussed [place] with daughter, no preference , [facility] notified and will provide HHPT.  Expected Discharge Date:      06/16/2016            Expected Discharge Plan:  [place]  In-House Referral:  NA  Discharge planning Services  CM Consult  Post Acute Care Choice:  Home Health Choice offered to:  Patient  DME Arranged:  N/A DME Agency:  NA  HH Arranged:  PT [place] Agency:   [place].   Status of Service:  Complete.  If discussed at [place] of Stay Meetings, dates discussed:    Additional Comments:  Adron Bene, RN 06/15/2016, 11:54 AM

## 2016-06-15 NOTE — Progress Notes (Addendum)
PROGRESS NOTE        PATIENT DETAILS Name: Shannon Higgins Age: 82 y.o. Sex: female Date of Birth: 17-Nov-1934 Admit Date: 06/13/2016 Admitting Physician Norval Morton, MD PCP:Pcp Not In System  Brief Narrative: Patient is a 82 y.o. female with history of type 2 diabetes, hypothyroidism, hypertension, dyslipidemia who presented to the hospital with fever, cough, and 2 episodes of syncope. She was initially hypotensive in the emergency room, and required IV fluid boluses. Further evaluation was positive for influenza B PCR. She was subsequently admitted for further evaluation and treatment. See below for further details.   Subjective: Feels much better-ambulated around the unit yesterday. Feels somewhat weak.  Assessment/Plan: Sepsis secondary to influenza: Sepsis pathophysiology has resolved, she has clinically improved. Plans are to continue Tamiflu. Blood cultures are still improved, but I doubt any bacterial infection-hence we will go ahead and discontinue vancomycin and Zosyn today. We will continue to follow cultures.  Acute bronchitis:Less wheezing today, suspect she has acute bronchitis due to influenza. Continue bronchodilators,start tapering steroids-follow clinical course.    Syncope: Hypotensive on presentation-suspect syncope related to orthostatic mechanism/volume issues.  telemetry-negative, echocardiogram with preserved EF. Doubt further workup required  Moderate pulmonary hypertension: Seen on echocardiogram-likely a chronic finding-could be related to grade 2 diastolic dysfunction. Further workup/evaluation deferred to the outpatient setting  Chronic diastolic heart failure: Clinically compensated.  Acute kidney injury: Likely hemodynamically mediated acute kidney injury-resolved with hydration.  Hyponatremia:Continues to improve with supportive measures.e for monitoring without any further workup. Euvolemic on exam  DM-2: CBGs stable with  SSI-continue to hold oral hypoglycemics   Essential hypertension: Blood pressure stable with Maxide.  Dyslipidemia: Continue statin  Hypothyroidism: Continue levothyroxine  Anxiety: Continue Klonopin  Deconditioning/generalized weakness: Likely secondary to acute illness-PT evaluation Completed-plans are for home health services on discharge.  DVT Prophylaxis: Prophylactic Lovenox   Code Status: Full code   Family Communication: None at bedside  Disposition Plan: Remain inpatient-home health services on discharge-Likely on 2/15.  Antimicrobial agents: Anti-infectives    Start     Dose/Rate Route Frequency Ordered Stop   06/14/16 1200  vancomycin (VANCOCIN) IVPB 1000 mg/200 mL premix     1,000 mg 200 mL/hr over 60 Minutes Intravenous Every 24 hours 06/13/16 2211     06/14/16 0500  piperacillin-tazobactam (ZOSYN) IVPB 3.375 g     3.375 g 12.5 mL/hr over 240 Minutes Intravenous Every 8 hours 06/13/16 2209     06/13/16 2215  oseltamivir (TAMIFLU) 6 MG/ML suspension 30 mg     30 mg Oral 2 times daily 06/13/16 2201 06/18/16 2159   06/13/16 2200  piperacillin-tazobactam (ZOSYN) IVPB 3.375 g     3.375 g 100 mL/hr over 30 Minutes Intravenous  Once 06/13/16 2159 06/13/16 2253   06/13/16 2200  vancomycin (VANCOCIN) IVPB 1000 mg/200 mL premix     1,000 mg 200 mL/hr over 60 Minutes Intravenous  Once 06/13/16 2159 06/13/16 2335      Procedures: Echo 2/13>> The right ventricular systolic pressure was increased consistent   with moderate pulmonary hypertension. EF 60-65%, with grade 2 diastolic dysfunction.  CONSULTS: PCCM  Time spent: 25 minutes-Greater than 50% of this time was spent in counseling, explanation of diagnosis, planning of further management, and coordination of care.  MEDICATIONS: Scheduled Meds: . Chlorhexidine Gluconate Cloth  6 each Topical Q0600  . enoxaparin (LOVENOX) injection  40 mg Subcutaneous Q24H  . gabapentin  600 mg Oral QID  . guaiFENesin  600  mg Oral BID  . Influenza vac split quadrivalent PF  0.5 mL Intramuscular Tomorrow-1000  . insulin aspart  0-9 Units Subcutaneous TID WC  . ipratropium-albuterol  3 mL Nebulization BID  . levothyroxine  125 mcg Oral QHS  . methylPREDNISolone (SOLU-MEDROL) injection  40 mg Intravenous Q12H  . mupirocin ointment  1 application Nasal BID  . oseltamivir  30 mg Oral BID  . pantoprazole  40 mg Oral Daily  . piperacillin-tazobactam (ZOSYN)  IV  3.375 g Intravenous Q8H  . pravastatin  40 mg Oral QHS  . triamterene-hydrochlorothiazide  1 capsule Oral q morning - 10a  . vancomycin  1,000 mg Intravenous Q24H   Continuous Infusions: PRN Meds:.acetaminophen **OR** acetaminophen, albuterol, clonazePAM, ondansetron **OR** ondansetron (ZOFRAN) IV, promethazine **OR** promethazine   PHYSICAL EXAM: Vital signs: Vitals:   06/14/16 2229 06/15/16 0533 06/15/16 0847 06/15/16 0939  BP: (!) 118/98 133/71  120/84  Pulse: 76 72  73  Resp: 17 18  18   Temp: 98.8 F (37.1 C) 98.5 F (36.9 C)  97.8 F (36.6 C)  TempSrc: Oral Oral  Oral  SpO2: 96% 95% 96% 95%  Weight: 78.7 kg (173 lb 6.4 oz)     Height:       Filed Weights   06/13/16 2047 06/14/16 0200 06/14/16 2229  Weight: 72.6 kg (160 lb) 78.2 kg (172 lb 6.4 oz) 78.7 kg (173 lb 6.4 oz)   Body mass index is 29.76 kg/m.   General appearance :Awake, alert, not in any distress. Speech Clear. Not toxic Looking Eyes:, pupils equally reactive to light and accomodation,no scleral icterus.Pink conjunctiva HEENT: Atraumatic and Normocephalic Neck: supple, no JVD. No cervical lymphadenopathy. No thyromegaly Resp:Good air entry bilaterally,Only a few scattered rhonchi  CVS: S1 S2 regular, no murmurs.  GI: Bowel sounds present, Non tender and not distended with no gaurding, rigidity or rebound.No organomegaly Extremities: B/L Lower Ext shows no edema, both legs are warm to touch Neurology:  speech clear,Non focal, sensation is grossly intact. Psychiatric:  Normal judgment and insight. Alert and oriented x 3.  Musculoskeletal:No digital cyanosis Skin:No Rash, warm and dry Wounds:N/A  I have personally reviewed following labs and imaging studies  LABORATORY DATA: CBC:  Recent Labs Lab 06/13/16 2048 06/14/16 0329  WBC 9.1 7.6  NEUTROABS 6.1  --   HGB 12.1 12.3  HCT 37.6 38.2  MCV 85.6 85.5  PLT 292 376    Basic Metabolic Panel:  Recent Labs Lab 06/13/16 2048 06/14/16 0329 06/15/16 0508  NA 128* 131* 132*  K 3.8 3.6 4.1  CL 92* 98* 94*  CO2 22 21* 28  GLUCOSE 167* 99 182*  BUN 13 10 9   CREATININE 1.32* 1.04* 0.89  CALCIUM 9.2 8.5* 9.0    GFR: Estimated Creatinine Clearance: 52 mL/min (by C-G formula based on SCr of 0.89 mg/dL).  Liver Function Tests:  Recent Labs Lab 06/13/16 2048  AST 33  ALT 20  ALKPHOS 92  BILITOT 0.8  PROT 6.8  ALBUMIN 3.9   No results for input(s): LIPASE, AMYLASE in the last 168 hours. No results for input(s): AMMONIA in the last 168 hours.  Coagulation Profile: No results for input(s): INR, PROTIME in the last 168 hours.  Cardiac Enzymes: No results for input(s): CKTOTAL, CKMB, CKMBINDEX, TROPONINI in the last 168 hours.  BNP (last 3 results) No results for input(s): PROBNP in the last 8760 hours.  HbA1C: No results for input(s): HGBA1C in the last 72 hours.  CBG:  Recent Labs Lab 06/14/16 0842 06/14/16 1156 06/14/16 1620 06/15/16 0800 06/15/16 1133  GLUCAP 108* 138* 190* 139* 122*    Lipid Profile: No results for input(s): CHOL, HDL, LDLCALC, TRIG, CHOLHDL, LDLDIRECT in the last 72 hours.  Thyroid Function Tests: No results for input(s): TSH, T4TOTAL, FREET4, T3FREE, THYROIDAB in the last 72 hours.  Anemia Panel: No results for input(s): VITAMINB12, FOLATE, FERRITIN, TIBC, IRON, RETICCTPCT in the last 72 hours.  Urine analysis:    Component Value Date/Time   COLORURINE YELLOW 06/13/2016 0041   APPEARANCEUR CLEAR 06/13/2016 0041   LABSPEC 1.015 06/13/2016  0041   PHURINE 6.0 06/13/2016 0041   GLUCOSEU NEGATIVE 06/13/2016 0041   HGBUR NEGATIVE 06/13/2016 0041   BILIRUBINUR NEGATIVE 06/13/2016 0041   KETONESUR NEGATIVE 06/13/2016 0041   PROTEINUR 30 (A) 06/13/2016 0041   UROBILINOGEN 0.2 04/26/2012 1547   NITRITE NEGATIVE 06/13/2016 0041   LEUKOCYTESUR NEGATIVE 06/13/2016 0041    Sepsis Labs: Lactic Acid, Venous    Component Value Date/Time   LATICACIDVEN 1.2 06/14/2016 0449    MICROBIOLOGY: Recent Results (from the past 240 hour(s))  Culture, sputum-assessment     Status: None   Collection Time: 06/14/16  4:24 AM  Result Value Ref Range Status   Specimen Description EXPECTORATED SPUTUM  Final   Special Requests Normal  Final   Sputum evaluation THIS SPECIMEN IS ACCEPTABLE FOR SPUTUM CULTURE  Final   Report Status 06/14/2016 FINAL  Final  MRSA PCR Screening     Status: Abnormal   Collection Time: 06/14/16  4:24 AM  Result Value Ref Range Status   MRSA by PCR POSITIVE (A) NEGATIVE Final    Comment:        The GeneXpert MRSA Assay (FDA approved for NASAL specimens only), is one component of a comprehensive MRSA colonization surveillance program. It is not intended to diagnose MRSA infection nor to guide or monitor treatment for MRSA infections. RESULT CALLED TO, READ BACK BY AND VERIFIED WITH: D. MUHORO [phone] N.MORRIS   Culture, respiratory (NON-Expectorated)     Status: None (Preliminary result)   Collection Time: 06/14/16  4:24 AM  Result Value Ref Range Status   Specimen Description EXPECTORATED SPUTUM  Final   Special Requests Normal Reflexed from [id number]  Final   Gram Stain PENDING  Incomplete   Culture FEW GRAM NEGATIVE RODS  Final   Report Status PENDING  Incomplete    RADIOLOGY STUDIES/RESULTS: Dg Chest 2 View  Result Date: 06/13/2016 CLINICAL DATA:  Cough and cold since Sunday. EXAM: CHEST  2 VIEW COMPARISON:  04/27/2012 FINDINGS: Heart is top normal and stable in appearance with aortic atherosclerosis. Mild  chronic interstitial prominence is noted bilaterally with mild peribronchial thickening suggestive of bronchitic change. No pneumonic consolidation, CHF nor effusion. No pneumothorax. Bilateral shoulder arthroplasties. Surgical clips project over the left lateral chest wall. IMPRESSION: Stable cardiomegaly with aortic atherosclerosis. Mild bronchitic change of the lungs. Electronically Signed   By: Ashley Royalty M.D.   On: 06/13/2016 21:22   Dg Chest Port 1v Same Day  Result Date: 06/14/2016 CLINICAL DATA:  82 year old female with shortness breath. Right shoulder pain. Subsequent encounter. EXAM: PORTABLE CHEST 1 VIEW COMPARISON:  06/13/2016 and 04/27/2012. FINDINGS: Portable chest x-ray with lordotic technique. Central pulmonary vascular prominence. Minimal peribronchial thickening without segmental consolidation. No plain film evidence of pulmonary malignancy. Heart size top-normal. Post bilateral shoulder replacement. IMPRESSION: Minimal peribronchial thickening without segmental  consolidation. Central pulmonary vascular prominence. Electronically Signed   By: Genia Del M.D.   On: 06/14/2016 07:50     LOS: 1 day   Oren Binet, MD  [facility] Pager:[phone]  If 7PM-7AM, please contact night-coverage [email or web address] Password [email or web address] 06/15/2016, 12:00 PM

## 2016-06-15 NOTE — Progress Notes (Signed)
Infectious Disease called and stated to place order for droplet and contact precautions. Order placed. Will continue to monitor.

## 2016-06-15 NOTE — Progress Notes (Signed)
Physical Therapy Treatment Patient Details Name: Shannon Higgins. Kemp MRN: [id number] DOB: Jan 10, 1935 Today's Date: 06/15/2016    History of Present Illness Patient is a 82 yo female admitted 06/13/16 with fever, cough, syncope x2.  Patient with Influenza B and hypotension.    PMH:  DM, HTN, HLD, anxiety, asthma, RA, OSA    PT Comments    Pt making gradual progress with mobility during PT sessions. Pt able to ambulate 75 ft without an assistive device and min guard assist for safety. Distance limited by fatigue. Pt up in chair after session. Anticipate D/C to home with family support. Pt confirms that she will have her daughter or son-in-law around to help.   Follow Up Recommendations  Home health PT;Supervision for mobility/OOB     Equipment Recommendations  None recommended by PT    Recommendations for Other Services       Precautions / Restrictions Precautions Precautions: Fall Restrictions Weight Bearing Restrictions: No    Mobility  Bed Mobility Overal bed mobility: Needs Assistance Bed Mobility: Supine to Sit     Supine to sit: Supervision     General bed mobility comments: using rail and HOB elevated approx. 20 degrees  Transfers Overall transfer level: Needs assistance Equipment used: None Transfers: Sit to/from Stand Sit to Stand: Min guard         General transfer comment: guard for safety  Ambulation/Gait Ambulation/Gait assistance: Min guard Ambulation Distance (Feet): 75 Feet Assistive device: None Gait Pattern/deviations: Step-through pattern;Decreased stride length Gait velocity: decreased   General Gait Details: No loss of balance, mild instability noted.    Stairs            Wheelchair Mobility    Modified Rankin (Stroke Patients Only)       Balance Overall balance assessment: Needs assistance Sitting-balance support: No upper extremity supported Sitting balance-Leahy Scale: Good     Standing balance support: No upper  extremity supported Standing balance-Leahy Scale: Good                      Cognition Arousal/Alertness: Awake/alert Behavior During Therapy: WFL for tasks assessed/performed Overall Cognitive Status: Within Functional Limits for tasks assessed                      Exercises      General Comments        Pertinent Vitals/Pain Pain Assessment: No/denies pain    Home Living                      Prior Function            PT Goals (current goals can now be found in the care plan section) Acute Rehab PT Goals Patient Stated Goal: go home PT Goal Formulation: With patient/family Time For Goal Achievement: 06/21/16 Potential to Achieve Goals: Good Progress towards PT goals: Progressing toward goals    Frequency    Min 3X/week      PT Plan Current plan remains appropriate    Co-evaluation             End of Session Equipment Utilized During Treatment: Gait belt Activity Tolerance: Patient limited by fatigue Patient left: in chair;with call bell/phone within reach;with nursing/sitter in room     Time: 0921-0939 PT Time Calculation (min) (ACUTE ONLY): 18 min  Charges:  $Gait Training: 8-22 mins  G Codes:      Cassell Clement, PT, CSCS Pager [phone] Office [phone]  06/15/2016, 9:46 AM

## 2016-06-16 DIAGNOSIS — N179 Acute kidney failure, unspecified: Secondary | ICD-10-CM | POA: Diagnosis not present

## 2016-06-16 DIAGNOSIS — A419 Sepsis, unspecified organism: Secondary | ICD-10-CM | POA: Diagnosis not present

## 2016-06-16 DIAGNOSIS — J101 Influenza due to other identified influenza virus with other respiratory manifestations: Secondary | ICD-10-CM | POA: Diagnosis not present

## 2016-06-16 DIAGNOSIS — E871 Hypo-osmolality and hyponatremia: Secondary | ICD-10-CM | POA: Diagnosis not present

## 2016-06-16 DIAGNOSIS — J209 Acute bronchitis, unspecified: Secondary | ICD-10-CM | POA: Diagnosis not present

## 2016-06-16 LAB — CULTURE, RESPIRATORY: SPECIAL REQUESTS: NORMAL

## 2016-06-16 LAB — CULTURE, RESPIRATORY W GRAM STAIN

## 2016-06-16 LAB — GLUCOSE, CAPILLARY: Glucose-Capillary: 137 mg/dL — ABNORMAL HIGH (ref 65–99)

## 2016-06-16 MED ORDER — ALBUTEROL SULFATE HFA 108 (90 BASE) MCG/ACT IN AERS: 1.0000 | INHALATION_SPRAY | Freq: Four times a day (QID) | RESPIRATORY_TRACT | 0 refills | Status: DC

## 2016-06-16 MED ORDER — OSELTAMIVIR PHOSPHATE 30 MG PO CAPS: 30.0000 mg | ORAL_CAPSULE | Freq: Two times a day (BID) | ORAL | 0 refills | Status: AC

## 2016-06-16 MED ORDER — PREDNISONE 20 MG PO TABS: 40.0000 mg | ORAL_TABLET | Freq: Every day | ORAL | Status: DC

## 2016-06-16 MED ORDER — PREDNISONE 10 MG PO TABS: ORAL_TABLET | ORAL | 0 refills | Status: DC

## 2016-06-16 MED ORDER — GUAIFENESIN ER 600 MG PO TB12: 600.0000 mg | ORAL_TABLET | Freq: Two times a day (BID) | ORAL | 0 refills | Status: DC

## 2016-06-16 NOTE — Progress Notes (Signed)
Shannon Higgins. Dugue to be D/C'd Home per MD order.  Discussed prescriptions and follow up appointments with the patient. Prescriptions given to patient, medication list explained in detail. Pt verbalized understanding.  Allergies as of 06/16/2016   No Known Allergies     Medication List    STOP taking these medications   predniSONE 10 MG tablet Commonly known as:  STERAPRED UNI-PAK Replaced by:  predniSONE 10 MG tablet     TAKE these medications   albuterol 108 (90 Base) MCG/ACT inhaler Commonly known as:  PROVENTIL HFA;VENTOLIN HFA Inhale 1 puff into the lungs 4 (four) times daily.   clonazePAM 1 MG tablet Commonly known as:  KLONOPIN Take 1 mg by mouth 2 (two) times daily as needed.   gabapentin 600 MG tablet Commonly known as:  NEURONTIN Take 600 mg by mouth 4 (four) times daily.   guaiFENesin 600 MG 12 hr tablet Commonly known as:  MUCINEX Take 1 tablet (600 mg total) by mouth 2 (two) times daily.   levothyroxine 125 MCG tablet Commonly known as:  SYNTHROID, LEVOTHROID Take 125 mcg by mouth at bedtime.   meloxicam 15 MG tablet Commonly known as:  MOBIC Take 15 mg by mouth daily.   metFORMIN 500 MG tablet Commonly known as:  GLUCOPHAGE Take 500 mg by mouth 2 (two) times daily with a meal.   omeprazole 20 MG capsule Commonly known as:  PRILOSEC Take 20 mg by mouth 2 (two) times daily.   oseltamivir 30 MG capsule Commonly known as:  TAMIFLU Take 1 capsule (30 mg total) by mouth 2 (two) times daily.   pravastatin 40 MG tablet Commonly known as:  PRAVACHOL Take 40 mg by mouth at bedtime.   predniSONE 10 MG tablet Commonly known as:  DELTASONE Take 4 tablets (40 mg) daily for 2 days, then, Take 3 tablets (30 mg) daily for 2 days, then, Take 2 tablets (20 mg) daily for 2 days, then, Take 1 tablets (10 mg) daily for 1 days, then stop Replaces:  predniSONE 10 MG tablet   triamterene-hydrochlorothiazide 37.5-25 MG capsule Commonly known as:  DYAZIDE Take 1 capsule  by mouth every morning.       Vitals:   06/16/16 0532 06/16/16 0818  BP: 132/68 131/74  Pulse: 67 67  Resp: 17 18  Temp: 97.5 F (36.4 C) 97.9 F (36.6 C)    Skin clean, dry and intact without evidence of skin break down, no evidence of skin tears noted. IV catheter discontinued intact. Site without signs and symptoms of complications. Dressing and pressure applied. Pt denies pain at this time. No complaints noted.  An After Visit Summary was printed and given to the patient. Patient escorted via [place], and D/C home via private auto.  Shannon Higgins BSN, RN

## 2016-06-16 NOTE — Progress Notes (Signed)
Physical Therapy Treatment Patient Details Name: Shannon Higgins MRN: [id number] DOB: 03/25/1935 Today's Date: 06/16/2016    History of Present Illness Patient is a 81 yo female admitted 06/13/16 with fever, cough, syncope x2.  Patient with Influenza B and hypotension.    PMH:  DM, HTN, HLD, anxiety, asthma, RA, OSA    PT Comments    Patient functioning at supervision to independent level with mobility and gait.  Able to negotiate stairs with min guard assist.  Patient has achieved acute PT goals and is ready for d/c from PT perspective.  PT will sign off.  Follow Up Recommendations  Home health PT;Supervision for mobility/OOB     Equipment Recommendations  None recommended by PT    Recommendations for Other Services       Precautions / Restrictions Precautions Precautions: Fall Precaution Comments: syncope pta Restrictions Weight Bearing Restrictions: No    Mobility  Bed Mobility Overal bed mobility: Independent                Transfers Overall transfer level: Independent Equipment used: None             General transfer comment: Independent from toilet and bed.  Ambulation/Gait Ambulation/Gait assistance: Supervision Ambulation Distance (Feet): 180 Feet Assistive device: None Gait Pattern/deviations: Step-through pattern;Decreased stride length Gait velocity: decreased Gait velocity interpretation: Below normal speed for age/gender General Gait Details: No loss of balance during gait with no assistive device.  Mild instability, and able to self-correct.   Stairs Stairs: Yes   Stair Management: One rail Right;Two rails;Step to pattern;Forwards Number of Stairs: 6 General stair comments: Instructed patient on step-to technique on stairs with 1 rail.  Able to perform with min guard assist for safety.  Instructed patient on how to have family assist her on stairs for safety.  Wheelchair Mobility    Modified Rankin (Stroke Patients Only)        Balance           Standing balance support: No upper extremity supported Standing balance-Leahy Scale: Good                      Cognition Arousal/Alertness: Awake/alert Behavior During Therapy: WFL for tasks assessed/performed Overall Cognitive Status: Within Functional Limits for tasks assessed                      Exercises      General Comments        Pertinent Vitals/Pain Pain Assessment: No/denies pain    Home Living                      Prior Function            PT Goals (current goals can now be found in the care plan section) Acute Rehab PT Goals Patient Stated Goal: go home Progress towards PT goals: Goals met/education completed, patient discharged from PT    Frequency    Min 3X/week      PT Plan Current plan remains appropriate    Co-evaluation             End of Session Equipment Utilized During Treatment: Gait belt Activity Tolerance: Patient tolerated treatment well;Patient limited by fatigue Patient left: in bed;with call bell/phone within reach     Time: 1043-1059 PT Time Calculation (min) (ACUTE ONLY): 16 min  Charges:  $Gait Training: 8-22 mins                      G Codes:       H  06/16/2016, 11:27 AM  H. , PT, MBA Acute Rehab Services Pager [phone]   

## 2016-06-16 NOTE — Discharge Summary (Signed)
PATIENT DETAILS Name: Shannon Higgins. Hemme Age: 82 y.o. Sex: female Date of Birth: Dec 27, 1934 MRN: [id number]. Admitting Physician: Norval Morton, MD PCP:Pcp Not In System  Admit Date: 06/13/2016 Discharge date: 06/16/2016  Recommendations for Outpatient Follow-up:  1. Follow up with PCP in 1-2 weeks 2. Please obtain BMP/CBC in one week  Admitted From:  Home   Disposition: Home with home health services   [place]: Yes  Equipment/Devices: None  Discharge Condition: Stable  CODE STATUS: FULL CODE  Diet recommendation:  Heart Healthy / Carb Modified  Brief Summary: See H&P, Labs, Consult and Test reports for all details in brief, Patient is a 82 y.o. female with history of type 2 diabetes, hypothyroidism, hypertension, dyslipidemia who presented to the hospital with fever, cough, and 2 episodes of syncope. She was initially hypotensive in the emergency room, and required IV fluid boluses. Further evaluation was positive for influenza B PCR. She was subsequently admitted for further evaluation and treatment. See below for further details.   Brief Hospital Course: Sepsis secondary to influenza: Sepsis pathophysiology has resolved, she has clinically improved. Plans are to continue Tamiflu to complete a 5 day course. Blood cultures are negative so far. CXR neg for PNA.Initially on broad spectrum antibiotics-Vanco/Zosyn-but this has now been discontinued-patient continues to improve on just Tamiflu  Acute bronchitis:Improved, hardly any rhonchi today. Start prednisone taper, continue usual prn Albuterol inhaler on discharge.   Syncope: Hypotensive on presentation-suspect syncope related to orthostatic mechanism/volume issues.  telemetry-negative, echocardiogram with preserved EF. Doubt further workup required  Moderate pulmonary hypertension: Seen on echocardiogram-likely a chronic finding-could be related to grade 2 diastolic dysfunction. Further workup/evaluation  deferred to the outpatient setting  Chronic diastolic heart failure: Clinically compensated.  Acute kidney injury: Likely hemodynamically mediated acute kidney injury-resolved with hydration.  Hyponatremia:Continues to improve with supportive measures.e for monitoring without any further workup. Euvolemic on exam  DM-2: CBGs stable with SSI while inpatient-resume oral hypoglycemics on discharge  Essential hypertension: Blood pressure stable with Maxide.  Dyslipidemia: Continue statin  Hypothyroidism: Continue levothyroxine  Anxiety: Continue Klonopin  Deconditioning/generalized weakness: Likely secondary to acute illness-PT evaluation Completed-plans are for home health services on discharge.  Note: Patient moved in with her daughter-she used to live in [place] is trying to find a PCP in this area. She will request her new PCP here in [place] to get records from the hospital.  Procedures/Studies: None  Discharge Diagnoses:  Principal Problem:   Influenza B Active Problems:   Sepsis ([place])   Diabetes mellitus ([place])   Transient hypotension   Acute kidney injury ([place])   Hypothyroidism   Hyponatremia   Discharge Instructions:  Activity:  As tolerated with Full fall precautions use walker/cane & assistance as needed  Discharge Instructions    Call MD for:  difficulty breathing, headache or visual disturbances    Complete by:  As directed    Diet - low sodium heart healthy    Complete by:  As directed    Increase activity slowly    Complete by:  As directed      Allergies as of 06/16/2016   No Known Allergies     Medication List    STOP taking these medications   predniSONE 10 MG tablet Commonly known as:  STERAPRED UNI-PAK Replaced by:  predniSONE 10 MG tablet     TAKE these medications   albuterol 108 (90 Base) MCG/ACT inhaler Commonly known as:  PROVENTIL HFA;VENTOLIN HFA Inhale 1 puff into the lungs 4 (  four) times daily.   clonazePAM  1 MG tablet Commonly known as:  KLONOPIN Take 1 mg by mouth 2 (two) times daily as needed.   gabapentin 600 MG tablet Commonly known as:  NEURONTIN Take 600 mg by mouth 4 (four) times daily.   guaiFENesin 600 MG 12 hr tablet Commonly known as:  MUCINEX Take 1 tablet (600 mg total) by mouth 2 (two) times daily.   levothyroxine 125 MCG tablet Commonly known as:  SYNTHROID, LEVOTHROID Take 125 mcg by mouth at bedtime.   meloxicam 15 MG tablet Commonly known as:  MOBIC Take 15 mg by mouth daily.   metFORMIN 500 MG tablet Commonly known as:  GLUCOPHAGE Take 500 mg by mouth 2 (two) times daily with a meal.   omeprazole 20 MG capsule Commonly known as:  PRILOSEC Take 20 mg by mouth 2 (two) times daily.   oseltamivir 30 MG capsule Commonly known as:  TAMIFLU Take 1 capsule (30 mg total) by mouth 2 (two) times daily.   pravastatin 40 MG tablet Commonly known as:  PRAVACHOL Take 40 mg by mouth at bedtime.   predniSONE 10 MG tablet Commonly known as:  DELTASONE Take 4 tablets (40 mg) daily for 2 days, then, Take 3 tablets (30 mg) daily for 2 days, then, Take 2 tablets (20 mg) daily for 2 days, then, Take 1 tablets (10 mg) daily for 1 days, then stop Replaces:  predniSONE 10 MG tablet   triamterene-hydrochlorothiazide 37.5-25 MG capsule Commonly known as:  DYAZIDE Take 1 capsule by mouth every morning.      Follow-up Information    Primary MD. Schedule an appointment as soon as possible for a visit in 1 week(s).          No Known Allergies  Consultations:   None  Other Procedures/Studies: Dg Chest 2 View  Result Date: 06/13/2016 CLINICAL DATA:  Cough and cold since Sunday. EXAM: CHEST  2 VIEW COMPARISON:  04/27/2012 FINDINGS: Heart is top normal and stable in appearance with aortic atherosclerosis. Mild chronic interstitial prominence is noted bilaterally with mild peribronchial thickening suggestive of bronchitic change. No pneumonic consolidation, CHF nor effusion.  No pneumothorax. Bilateral shoulder arthroplasties. Surgical clips project over the left lateral chest wall. IMPRESSION: Stable cardiomegaly with aortic atherosclerosis. Mild bronchitic change of the lungs. Electronically Signed   By: Ashley Royalty M.D.   On: 06/13/2016 21:22   Dg Chest Port 1v Same Day  Result Date: 06/14/2016 CLINICAL DATA:  82 year old female with shortness breath. Right shoulder pain. Subsequent encounter. EXAM: PORTABLE CHEST 1 VIEW COMPARISON:  06/13/2016 and 04/27/2012. FINDINGS: Portable chest x-ray with lordotic technique. Central pulmonary vascular prominence. Minimal peribronchial thickening without segmental consolidation. No plain film evidence of pulmonary malignancy. Heart size top-normal. Post bilateral shoulder replacement. IMPRESSION: Minimal peribronchial thickening without segmental consolidation. Central pulmonary vascular prominence. Electronically Signed   By: Genia Del M.D.   On: 06/14/2016 07:50     TODAY-DAY OF DISCHARGE:  Subjective:   Shannon Higgins today has no headache,no chest abdominal pain,no new weakness tingling or numbness, feels much better wants to go home today.   Objective:   Blood pressure 131/74, pulse 67, temperature 97.9 F (36.6 C), temperature source Oral, resp. rate 18, height 5\' 4"  (1.626 m), weight 78.6 kg (173 lb 3.2 oz), SpO2 97 %.  Intake/Output Summary (Last 24 hours) at 06/16/16 0915 Last data filed at 06/16/16 0601  Gross per 24 hour  Intake  720 ml  Output             2850 ml  Net            -2130 ml   Filed Weights   06/14/16 0200 06/14/16 2229 06/15/16 2123  Weight: 78.2 kg (172 lb 6.4 oz) 78.7 kg (173 lb 6.4 oz) 78.6 kg (173 lb 3.2 oz)    Exam: Awake Alert, Oriented *3, No new F.N deficits, Normal affect [place].AT,PERRAL Supple Neck,No JVD, No cervical lymphadenopathy appriciated.  Symmetrical Chest wall movement, Good air movement bilaterally, CTAB RRR,No Gallops,Rubs or new Murmurs, No  Parasternal Heave +ve B.Sounds, Abd Soft, Non tender, No organomegaly appriciated, No rebound -guarding or rigidity. No Cyanosis, Clubbing or edema, No new Rash or bruise   PERTINENT RADIOLOGIC STUDIES: Dg Chest 2 View  Result Date: 06/13/2016 CLINICAL DATA:  Cough and cold since Sunday. EXAM: CHEST  2 VIEW COMPARISON:  04/27/2012 FINDINGS: Heart is top normal and stable in appearance with aortic atherosclerosis. Mild chronic interstitial prominence is noted bilaterally with mild peribronchial thickening suggestive of bronchitic change. No pneumonic consolidation, CHF nor effusion. No pneumothorax. Bilateral shoulder arthroplasties. Surgical clips project over the left lateral chest wall. IMPRESSION: Stable cardiomegaly with aortic atherosclerosis. Mild bronchitic change of the lungs. Electronically Signed   By: Ashley Royalty M.D.   On: 06/13/2016 21:22   Dg Chest Port 1v Same Day  Result Date: 06/14/2016 CLINICAL DATA:  82 year old female with shortness breath. Right shoulder pain. Subsequent encounter. EXAM: PORTABLE CHEST 1 VIEW COMPARISON:  06/13/2016 and 04/27/2012. FINDINGS: Portable chest x-ray with lordotic technique. Central pulmonary vascular prominence. Minimal peribronchial thickening without segmental consolidation. No plain film evidence of pulmonary malignancy. Heart size top-normal. Post bilateral shoulder replacement. IMPRESSION: Minimal peribronchial thickening without segmental consolidation. Central pulmonary vascular prominence. Electronically Signed   By: Genia Del M.D.   On: 06/14/2016 07:50     PERTINENT LAB RESULTS: CBC:  Recent Labs  06/13/16 2048 06/14/16 0329  WBC 9.1 7.6  HGB 12.1 12.3  HCT 37.6 38.2  PLT 292 274   CMET CMP     Component Value Date/Time   NA 132 (L) 06/15/2016 0508   K 4.1 06/15/2016 0508   CL 94 (L) 06/15/2016 0508   CO2 28 06/15/2016 0508   GLUCOSE 182 (H) 06/15/2016 0508   BUN 9 06/15/2016 0508   CREATININE 0.89 06/15/2016 0508     CALCIUM 9.0 06/15/2016 0508   PROT 6.8 06/13/2016 2048   ALBUMIN 3.9 06/13/2016 2048   AST 33 06/13/2016 2048   ALT 20 06/13/2016 2048   ALKPHOS 92 06/13/2016 2048   BILITOT 0.8 06/13/2016 2048   GFRNONAA >60 06/15/2016 0508   GFRAA >60 06/15/2016 0508    GFR Estimated Creatinine Clearance: 52 mL/min (by C-G formula based on SCr of 0.89 mg/dL). No results for input(s): LIPASE, AMYLASE in the last 72 hours. No results for input(s): CKTOTAL, CKMB, CKMBINDEX, TROPONINI in the last 72 hours. Invalid input(s): POCBNP No results for input(s): DDIMER in the last 72 hours. No results for input(s): HGBA1C in the last 72 hours. No results for input(s): CHOL, HDL, LDLCALC, TRIG, CHOLHDL, LDLDIRECT in the last 72 hours. No results for input(s): TSH, T4TOTAL, T3FREE, THYROIDAB in the last 72 hours.  Invalid input(s): FREET3 No results for input(s): VITAMINB12, FOLATE, FERRITIN, TIBC, IRON, RETICCTPCT in the last 72 hours. Coags: No results for input(s): INR in the last 72 hours.  Invalid input(s): PT Microbiology: Recent Results (from the past  240 hour(s))  Culture, blood (Routine X 2) w Reflex to ID Panel     Status: None (Preliminary result)   Collection Time: 06/13/16 10:17 PM  Result Value Ref Range Status   Specimen Description BLOOD RIGHT FOREARM  Final   Special Requests AEROBIC BOTTLE ONLY 5ML  Final   Culture NO GROWTH 1 DAY  Final   Report Status PENDING  Incomplete  Culture, blood (Routine X 2) w Reflex to ID Panel     Status: None (Preliminary result)   Collection Time: 06/14/16  2:39 AM  Result Value Ref Range Status   Specimen Description BLOOD BLOOD LEFT HAND  Final   Special Requests IN PEDIATRIC BOTTLE  1CC  Final   Culture NO GROWTH 1 DAY  Final   Report Status PENDING  Incomplete  Culture, sputum-assessment     Status: None   Collection Time: 06/14/16  4:24 AM  Result Value Ref Range Status   Specimen Description EXPECTORATED SPUTUM  Final   Special Requests  Normal  Final   Sputum evaluation THIS SPECIMEN IS ACCEPTABLE FOR SPUTUM CULTURE  Final   Report Status 06/14/2016 FINAL  Final  MRSA PCR Screening     Status: Abnormal   Collection Time: 06/14/16  4:24 AM  Result Value Ref Range Status   MRSA by PCR POSITIVE (A) NEGATIVE Final    Comment:        The GeneXpert MRSA Assay (FDA approved for NASAL specimens only), is one component of a comprehensive MRSA colonization surveillance program. It is not intended to diagnose MRSA infection nor to guide or monitor treatment for MRSA infections. RESULT CALLED TO, READ BACK BY AND VERIFIED WITH: D. MUHORO [phone] N.MORRIS   Culture, respiratory (NON-Expectorated)     Status: None (Preliminary result)   Collection Time: 06/14/16  4:24 AM  Result Value Ref Range Status   Specimen Description EXPECTORATED SPUTUM  Final   Special Requests Normal Reflexed from T1261  Final   Gram Stain   Final    ABUNDANT WBC PRESENT,BOTH PMN AND MONONUCLEAR ABUNDANT GRAM POSITIVE COCCI IN PAIRS ABUNDANT GRAM NEGATIVE RODS RARE GRAM POSITIVE RODS    Culture FEW GRAM NEGATIVE RODS  Final   Report Status PENDING  Incomplete    FURTHER DISCHARGE INSTRUCTIONS:  Get Medicines reviewed and adjusted: Please take all your medications with you for your next visit with your Primary MD  Laboratory/radiological data: Please request your Primary MD to go over all hospital tests and procedure/radiological results at the follow up, please ask your Primary MD to get all Hospital records sent to his/her office.  In some cases, they will be blood work, cultures and biopsy results pending at the time of your discharge. Please request that your primary care M.D. goes through all the records of your hospital data and follows up on these results.  Also Note the following: If you experience worsening of your admission symptoms, develop shortness of breath, life threatening emergency, suicidal or homicidal thoughts you must seek  medical attention immediately by calling 911 or calling your MD immediately  if symptoms less severe.  You must read complete instructions/literature along with all the possible adverse reactions/side effects for all the Medicines you take and that have been prescribed to you. Take any new Medicines after you have completely understood and accpet all the possible adverse reactions/side effects.   Do not drive when taking Pain medications or sleeping medications (Benzodaizepines)  Do not take more than prescribed Pain, Sleep and  Anxiety Medications. It is not advisable to combine anxiety,sleep and pain medications without talking with your primary care practitioner  Special Instructions: If you have smoked or chewed Tobacco  in the last 2 yrs please stop smoking, stop any regular Alcohol  and or any Recreational drug use.  Wear Seat belts while driving.  Please note: You were cared for by a hospitalist during your hospital stay. Once you are discharged, your primary care physician will handle any further medical issues. Please note that NO REFILLS for any discharge medications will be authorized once you are discharged, as it is imperative that you return to your primary care physician (or establish a relationship with a primary care physician if you do not have one) for your post hospital discharge needs so that they can reassess your need for medications and monitor your lab values.  Total Time spent coordinating discharge including counseling, education and face to face time equals 45 minutes.  SignedOren Binet 06/16/2016 9:15 AM

## 2016-06-19 LAB — CULTURE, BLOOD (ROUTINE X 2)
Culture: NO GROWTH
Culture: NO GROWTH

## 2016-11-11 ENCOUNTER — Encounter (HOSPITAL_COMMUNITY): Payer: Self-pay

## 2016-11-11 ENCOUNTER — Emergency Department (HOSPITAL_COMMUNITY)
Admission: EM | Admit: 2016-11-11 | Discharge: 2016-11-12 | Disposition: A | Discharge status: Home / Self Care | Payer: Medicare HMO | Attending: Emergency Medicine | Admitting: Emergency Medicine

## 2016-11-11 ENCOUNTER — Emergency Department (HOSPITAL_COMMUNITY): Payer: Medicare HMO

## 2016-11-11 DIAGNOSIS — J45909 Unspecified asthma, uncomplicated: Secondary | ICD-10-CM | POA: Diagnosis not present

## 2016-11-11 DIAGNOSIS — R531 Weakness: Secondary | ICD-10-CM | POA: Diagnosis not present

## 2016-11-11 DIAGNOSIS — Z79899 Other long term (current) drug therapy: Secondary | ICD-10-CM | POA: Diagnosis not present

## 2016-11-11 DIAGNOSIS — E119 Type 2 diabetes mellitus without complications: Secondary | ICD-10-CM | POA: Insufficient documentation

## 2016-11-11 DIAGNOSIS — Z7984 Long term (current) use of oral hypoglycemic drugs: Secondary | ICD-10-CM | POA: Diagnosis not present

## 2016-11-11 DIAGNOSIS — N3000 Acute cystitis without hematuria: Secondary | ICD-10-CM | POA: Diagnosis not present

## 2016-11-11 DIAGNOSIS — I10 Essential (primary) hypertension: Secondary | ICD-10-CM | POA: Insufficient documentation

## 2016-11-11 DIAGNOSIS — E039 Hypothyroidism, unspecified: Secondary | ICD-10-CM | POA: Insufficient documentation

## 2016-11-11 DIAGNOSIS — R51 Headache: Secondary | ICD-10-CM | POA: Diagnosis present

## 2016-11-11 DIAGNOSIS — R519 Headache, unspecified: Secondary | ICD-10-CM

## 2016-11-11 LAB — BASIC METABOLIC PANEL
ANION GAP: 11 (ref 5–15)
BUN: 19 mg/dL (ref 6–20)
CALCIUM: 8.3 mg/dL — AB (ref 8.9–10.3)
CO2: 21 mmol/L — ABNORMAL LOW (ref 22–32)
Chloride: 102 mmol/L (ref 101–111)
Creatinine, Ser: 1.02 mg/dL — ABNORMAL HIGH (ref 0.44–1.00)
GFR, EST AFRICAN AMERICAN: 59 mL/min — AB (ref >60)
GFR, EST NON AFRICAN AMERICAN: 51 mL/min — AB (ref >60)
Glucose, Bld: 80 mg/dL (ref 65–99)
POTASSIUM: 4.2 mmol/L (ref 3.5–5.1)
Sodium: 134 mmol/L — ABNORMAL LOW (ref 135–145)

## 2016-11-11 LAB — CBG MONITORING, ED: GLUCOSE-CAPILLARY: 80 mg/dL (ref 65–99)

## 2016-11-11 LAB — URINALYSIS, ROUTINE W REFLEX MICROSCOPIC
Bilirubin Urine: NEGATIVE
GLUCOSE, UA: NEGATIVE mg/dL
Hgb urine dipstick: NEGATIVE
KETONES UR: NEGATIVE mg/dL
NITRITE: NEGATIVE
PH: 5 (ref 5.0–8.0)
Protein, ur: NEGATIVE mg/dL
SPECIFIC GRAVITY, URINE: 1.011 (ref 1.005–1.030)

## 2016-11-11 LAB — CBC
HEMATOCRIT: 37.7 % (ref 36.0–46.0)
HEMOGLOBIN: 11.6 g/dL — AB (ref 12.0–15.0)
MCH: 26.6 pg (ref 26.0–34.0)
MCHC: 30.8 g/dL (ref 30.0–36.0)
MCV: 86.5 fL (ref 78.0–100.0)
Platelets: 360 10*3/uL (ref 150–400)
RBC: 4.36 MIL/uL (ref 3.87–5.11)
RDW: 15.4 % (ref 11.5–15.5)
WBC: 13 10*3/uL — ABNORMAL HIGH (ref 4.0–10.5)

## 2016-11-11 MED ORDER — DEXAMETHASONE SODIUM PHOSPHATE 10 MG/ML IJ SOLN
10.0000 mg | Freq: Once | INTRAMUSCULAR | Status: AC
  Administered 2016-11-11: 10 mg via INTRAVENOUS
  Filled 2016-11-11: qty 1

## 2016-11-11 MED ORDER — METOCLOPRAMIDE HCL 5 MG/ML IJ SOLN
10.0000 mg | Freq: Once | INTRAMUSCULAR | Status: AC
  Administered 2016-11-11: 10 mg via INTRAVENOUS
  Filled 2016-11-11: qty 2

## 2016-11-11 MED ORDER — SODIUM CHLORIDE 0.9 % IV BOLUS (SEPSIS)
1000.0000 mL | Freq: Once | INTRAVENOUS | Status: AC
  Administered 2016-11-11: 1000 mL via INTRAVENOUS

## 2016-11-11 MED ORDER — CIPROFLOXACIN HCL 500 MG PO TABS: 500.0000 mg | ORAL_TABLET | Freq: Two times a day (BID) | ORAL | 0 refills | Status: DC

## 2016-11-11 MED ORDER — DIPHENHYDRAMINE HCL 50 MG/ML IJ SOLN
25.0000 mg | Freq: Once | INTRAMUSCULAR | Status: AC
Start: 2016-11-11 — End: 2016-11-11
  Administered 2016-11-11: 25 mg via INTRAVENOUS
  Filled 2016-11-11: qty 1

## 2016-11-11 MED ORDER — TRAMADOL HCL 50 MG PO TABS: 50.0000 mg | ORAL_TABLET | Freq: Four times a day (QID) | ORAL | 0 refills | Status: DC | PRN

## 2016-11-11 NOTE — ED Notes (Signed)
Patient here for migraines that have going on for the last 10 days.  Patient also had weakness that developed over the last day.  Patient is A&Ox4 at this time. States she has not been to PCP since the migraines started.

## 2016-11-11 NOTE — Discharge Instructions (Signed)
Cipro as prescribed.  Tramadol as prescribed as needed for pain.  Return to the emergency department if your symptoms significantly worsen or change.

## 2016-11-11 NOTE — ED Provider Notes (Signed)
[facility] DEPT Provider Note   CSN: [id number] Arrival date & time: 11/11/16  [place]     History   Chief Complaint Chief Complaint  Patient presents with  . Weakness  . Headache    HPI Shannon Higgins is a 82 y.o. female.  Patient is a 82 year old female with history of migraines presenting with complaints of headache. This has been ongoing for the past 4 weeks, however has worsened over the past several days. She has had some unsteadiness on her feet and has actually fallen once. She denies any visual disturbances. She denies any nausea, vomiting, fevers, or chills. She states that this feels similar to her prior migraines, however Maxalt has not been helping.   The history is provided by the patient.  Headache   This is a new problem. Episode onset: 4 weeks ago. The problem occurs constantly. The problem has been gradually worsening. The headache is associated with nothing. The pain is located in the frontal region. The pain is moderate. The pain does not radiate. Pertinent negatives include no fever, no malaise/fatigue, no nausea and no vomiting. Treatments tried: Maxalt. The treatment provided mild relief.    Past Medical History:  Diagnosis Date  . Arthritis   . Asthma   . Diabetes mellitus without complication ([place])   . GERD (gastroesophageal reflux disease)   . History of recurrent UTIs   . Hypertension   . Migraine   . Neuropathic pain   . Sinusitis   . Sleep apnea   . Thyroid disease     Patient Active Problem List   Diagnosis Date Noted  . Influenza B 06/14/2016  . Transient hypotension 06/14/2016  . Acute kidney injury ([place]) 06/14/2016  . Hypothyroidism 06/14/2016  . Hyponatremia   . Pneumonia 04/29/2012  . Sepsis ([place]) 04/26/2012  . Asthma exacerbation 04/26/2012  . Dehydration 04/26/2012  . Hypokalemia 04/26/2012  . Anemia 04/26/2012  . Diabetes mellitus ([place]) 04/26/2012  . HTN (hypertension) 04/26/2012  . GERD (gastroesophageal reflux  disease) 04/26/2012  . Fall at home 04/26/2012  . OSA (obstructive sleep apnea) 04/26/2012  . Nausea and vomiting 04/26/2012  . Altered mental state 04/26/2012    Past Surgical History:  Procedure Laterality Date  . ABDOMINAL HYSTERECTOMY    . BACK SURGERY    . CHOLECYSTECTOMY    . KNEE SURGERY    . SHOULDER SURGERY      OB History    No data available       Home Medications    Prior to Admission medications   Medication Sig Start Date End Date Taking? Authorizing Provider  albuterol (PROVENTIL HFA;VENTOLIN HFA) 108 (90 Base) MCG/ACT inhaler Inhale 1 puff into the lungs 4 (four) times daily. 06/16/16   Ghimire, Henreitta Leber, MD  clonazePAM (KLONOPIN) 1 MG tablet Take 1 mg by mouth 2 (two) times daily as needed.     [provider]  gabapentin (NEURONTIN) 600 MG tablet Take 600 mg by mouth 4 (four) times daily.    [provider]  guaiFENesin (MUCINEX) 600 MG 12 hr tablet Take 1 tablet (600 mg total) by mouth 2 (two) times daily. 06/16/16   Ghimire, Henreitta Leber, MD  levothyroxine (SYNTHROID, LEVOTHROID) 125 MCG tablet Take 125 mcg by mouth at bedtime.    [provider]  meloxicam (MOBIC) 15 MG tablet Take 15 mg by mouth daily.    [provider]  metFORMIN (GLUCOPHAGE) 500 MG tablet Take 500 mg by mouth 2 (two) times daily with  a meal.    [provider]  omeprazole (PRILOSEC) 20 MG capsule Take 20 mg by mouth 2 (two) times daily.    [provider]  pravastatin (PRAVACHOL) 40 MG tablet Take 40 mg by mouth at bedtime.    [provider]  predniSONE (DELTASONE) 10 MG tablet Take 4 tablets (40 mg) daily for 2 days, then, Take 3 tablets (30 mg) daily for 2 days, then, Take 2 tablets (20 mg) daily for 2 days, then, Take 1 tablets (10 mg) daily for 1 days, then stop 06/16/16   Ghimire, Henreitta Leber, MD  triamterene-hydrochlorothiazide (DYAZIDE) 37.5-25 MG per capsule Take 1 capsule by mouth every morning.    [provider]    Family History Family History  Problem Relation Age of Onset  . Diabetes Daughter   . Hypertension Daughter   . Fibromyalgia Daughter   . GER disease Daughter   . Fibromyalgia Daughter   . Crohn's disease Daughter   . Asthma Daughter     Social History Social History  Substance Use Topics  . Smoking status: Never Smoker  . Smokeless tobacco: Never Used  . Alcohol use No     Allergies   Patient has no known allergies.   Review of Systems Review of Systems  Constitutional: Negative for fever and malaise/fatigue.  Gastrointestinal: Negative for nausea and vomiting.  Neurological: Positive for headaches.  All other systems reviewed and are negative.    Physical Exam Updated Vital Signs There were no vitals taken for this visit.  Physical Exam  Constitutional: She is oriented to person, place, and time. She appears well-developed and well-nourished. No distress.  HENT:  Head: Normocephalic and atraumatic.  Mouth/Throat: Oropharynx is clear and moist.  Eyes: Pupils are equal, round, and reactive to light. EOM are normal.  Neck: Normal range of motion. Neck supple.  Cardiovascular: Normal rate and regular rhythm.  Exam reveals no gallop and no friction rub.   No murmur heard. Pulmonary/Chest: Effort normal and breath sounds normal. No respiratory distress. She has no wheezes.  Abdominal: Soft. Bowel sounds are normal. She exhibits no distension. There is no tenderness.  Musculoskeletal: Normal range of motion.  Neurological: She is alert and oriented to person, place, and time. No cranial nerve deficit. She exhibits normal muscle tone. Coordination normal.  Skin: Skin is warm and dry. She is not diaphoretic.  Nursing note and vitals reviewed.    ED Treatments / Results  Labs (all labs ordered are listed, but only abnormal results are displayed) Labs Reviewed  BASIC METABOLIC PANEL  CBC  URINALYSIS, ROUTINE W REFLEX MICROSCOPIC  CBG MONITORING, ED     EKG  EKG Interpretation None       Radiology No results found.  Procedures Procedures (including critical care time)  Medications Ordered in ED Medications  sodium chloride 0.9 % bolus 1,000 mL (not administered)  diphenhydrAMINE (BENADRYL) injection 25 mg (not administered)  dexamethasone (DECADRON) injection 10 mg (not administered)  metoCLOPramide (REGLAN) injection 10 mg (not administered)     Initial Impression / Assessment and Plan / ED Course  I have reviewed the triage vital signs and the nursing notes.  Pertinent labs & imaging results that were available during my care of the patient were reviewed by me and considered in my medical decision making (see chart for details).  Patient presents with headache that appears related to migraines. Her neurologic exam is nonfocal, CT of the head is normal, and she is feeling better  after medications given in the ER. She is also concerned she may have a UTI. Her UA is equivocal and will be treated with antibiotics. She is to follow-up if not improving. I will also prescribe tramadol she can use as needed for continuing headache.  Final Clinical Impressions(s) / ED Diagnoses   Final diagnoses:  None    New Prescriptions New Prescriptions   No medications on file     Veryl Speak, MD 11/11/16 2328

## 2016-12-06 ENCOUNTER — Encounter: Payer: Self-pay | Admitting: Primary Care

## 2016-12-06 ENCOUNTER — Ambulatory Visit (INDEPENDENT_AMBULATORY_CARE_PROVIDER_SITE_OTHER): Payer: Medicare HMO | Admitting: Primary Care

## 2016-12-06 VITALS — BP 122/76 | HR 65 | Temp 98.2°F | Ht 62.25 in | Wt 172.8 lb

## 2016-12-06 DIAGNOSIS — E039 Hypothyroidism, unspecified: Secondary | ICD-10-CM | POA: Diagnosis not present

## 2016-12-06 DIAGNOSIS — G8929 Other chronic pain: Secondary | ICD-10-CM | POA: Insufficient documentation

## 2016-12-06 DIAGNOSIS — I10 Essential (primary) hypertension: Secondary | ICD-10-CM

## 2016-12-06 DIAGNOSIS — M792 Neuralgia and neuritis, unspecified: Secondary | ICD-10-CM | POA: Diagnosis not present

## 2016-12-06 DIAGNOSIS — R410 Disorientation, unspecified: Secondary | ICD-10-CM

## 2016-12-06 DIAGNOSIS — C439 Malignant melanoma of skin, unspecified: Secondary | ICD-10-CM | POA: Diagnosis not present

## 2016-12-06 DIAGNOSIS — E119 Type 2 diabetes mellitus without complications: Secondary | ICD-10-CM

## 2016-12-06 DIAGNOSIS — C50911 Malignant neoplasm of unspecified site of right female breast: Secondary | ICD-10-CM | POA: Diagnosis not present

## 2016-12-06 DIAGNOSIS — H409 Unspecified glaucoma: Secondary | ICD-10-CM | POA: Insufficient documentation

## 2016-12-06 DIAGNOSIS — R51 Headache: Secondary | ICD-10-CM

## 2016-12-06 DIAGNOSIS — R519 Headache, unspecified: Secondary | ICD-10-CM | POA: Insufficient documentation

## 2016-12-06 DIAGNOSIS — F0390 Unspecified dementia without behavioral disturbance: Secondary | ICD-10-CM | POA: Insufficient documentation

## 2016-12-06 DIAGNOSIS — E785 Hyperlipidemia, unspecified: Secondary | ICD-10-CM | POA: Diagnosis not present

## 2016-12-06 DIAGNOSIS — C50919 Malignant neoplasm of unspecified site of unspecified female breast: Secondary | ICD-10-CM | POA: Insufficient documentation

## 2016-12-06 HISTORY — DX: Disorientation, unspecified: R41.0

## 2016-12-06 LAB — COMPREHENSIVE METABOLIC PANEL
ALK PHOS: 92 U/L (ref 39–117)
ALT: 19 U/L (ref 0–35)
AST: 20 U/L (ref 0–37)
Albumin: 4.5 g/dL (ref 3.5–5.2)
BILIRUBIN TOTAL: 0.3 mg/dL (ref 0.2–1.2)
BUN: 16 mg/dL (ref 6–23)
CALCIUM: 9.8 mg/dL (ref 8.4–10.5)
CO2: 28 mEq/L (ref 19–32)
Chloride: 98 mEq/L (ref 96–112)
Creatinine, Ser: 0.76 mg/dL (ref 0.40–1.20)
GFR: 77.9 mL/min (ref >60.00)
Glucose, Bld: 115 mg/dL — ABNORMAL HIGH (ref 70–99)
Potassium: 4.2 mEq/L (ref 3.5–5.1)
Sodium: 135 mEq/L (ref 135–145)
TOTAL PROTEIN: 7.8 g/dL (ref 6.0–8.3)

## 2016-12-06 LAB — LIPID PANEL
CHOLESTEROL: 331 mg/dL — AB (ref 0–200)
HDL: 59.3 mg/dL (ref >39.00)
LDL Cholesterol: 232 mg/dL — ABNORMAL HIGH (ref 0–99)
NonHDL: 271.64
TRIGLYCERIDES: 199 mg/dL — AB (ref 0.0–149.0)
Total CHOL/HDL Ratio: 6
VLDL: 39.8 mg/dL (ref 0.0–40.0)

## 2016-12-06 LAB — TSH: TSH: 18.4 u[IU]/mL — ABNORMAL HIGH (ref 0.35–4.50)

## 2016-12-06 LAB — HEMOGLOBIN A1C: Hgb A1c MFr Bld: 7.7 % — ABNORMAL HIGH (ref 4.6–6.5)

## 2016-12-06 NOTE — Progress Notes (Signed)
Subjective:    Patient ID: Shannon Higgins. Shannon Higgins, female    DOB: [birth year], 82 y.o.   MRN: [id number]  HPI  [name] is a 82 year old female who presents today to establish care and discuss the problems mentioned below. Will obtain old records.  1) Essential Hypertension: Currently prescirbed triamterene-HCTZ 37.5-25 mg, but stopped taking this 3-4 months ago.  Her daughter is checking her BP at home which is running 115-120's/70's. She is taking propranolol 80 mg once daily, mostly for headaches.  2) Hypothyroidism: Currently managed on levothyroxine 125 mcg. She's not sure when she's had her last thyroid checked. She's not had a recent change in her levothyroxine dose in years. She denies palpitations, fatigue, hair loss.  3) Hyperlipidemia: Currently prescribed pravastatin 40 mg. She was told by her prior PCP to stop taking this. She stopped taking her pravastatin 1 year ago. She's unsure of her last cholesterol check.   4) Type 2 Diabetes: Currently managed on metformin 500 mg BID. She's unsure of her last A1C check. She's been checking her blood sugars recently which run 140-150's after a meal. She's not sure of her other readings.   5) Neuropathic Pain: Currently managed on duloxetine 60 mg and gabapentin 600 mg four times daily. Sometimes this isn't enough to help manage pain. Overall stable.   6) Breast Cancer: Diagnosed three years ago. Did not undergo radiatoin and/or chemotherapy. Underwent right lumpectomy. Currently managed on anastrozole 1 mg, this was managed by her prior PCP. She's not seen her oncologist in several years. No mammogram last year. [place].   7) Skin Cancer: Previously managed per dermatologist in [place], [place]. History of melanoma that was removed in February 2018. She was told this was fast growing. She was told that she needed to see ENT for evaluation given location of the cancer on her chin. She's not done this.  8) Intermittent Chronic Confusion:  Patient and daughter would like referral to Neurology. Family has noted intermittent confusion over the past 3-4 months. Difficulty with word finding for the past 2 months.  9) Glaucoma: Currently managed on Timolol, Latanoprost, Brimonidine. Diagnosed 1-2 years ago. Will need to establish with local Opthalmology.   10) Rheumatoid Arthritis: Currently managed on Meloxicam 15 mg for which she takes PRN. Pain to hips, back, hands. Not currently following with rheumatology.  11) Chronic Headaches:  Currently managed on propranolol 80 mg for headaches. Chronic headaches for years, dissipated for years, then started 2 months ago. Headaches located to the left frontal lobe that is intermittent. She's failed treatment on Topamax, amitriptyline, and Imitrex.   Review of Systems  Constitutional: Negative for unexpected weight change.  HENT: Negative for trouble swallowing.   Eyes: Negative for visual disturbance.  Respiratory: Negative for shortness of breath.   Cardiovascular: Negative for chest pain.  Endocrine: Negative for cold intolerance.  Musculoskeletal: Positive for arthralgias.  Skin:       History of melanoma. No recent lesions.  Neurological: Positive for headaches.       Neuropathic pain  Psychiatric/Behavioral:       Chronic confusion       Past Medical History:  Diagnosis Date  . Asthma   . Chronic sinusitis   . Diabetes mellitus without complication ([place])   . GERD (gastroesophageal reflux disease)   . History of recurrent UTIs   . Hypertension   . Insomnia   . Migraine   . Neuropathic pain   . Rheumatoid arthritis ([place])   .  Sleep apnea   . Thyroid disease      Social History   Social History  . Marital status: Widowed    Spouse name: N/A  . Number of children: N/A  . Years of education: N/A   Occupational History  . Not on file.   Social History Main Topics  . Smoking status: Never Smoker  . Smokeless tobacco: Never Used  . Alcohol use No  . Drug use:  No  . Sexual activity: Not on file   Other Topics Concern  . Not on file   Social History Narrative  . No narrative on file    Past Surgical History:  Procedure Laterality Date  . ABDOMINAL HYSTERECTOMY    . BACK SURGERY    . CHOLECYSTECTOMY    . KNEE SURGERY    . SHOULDER SURGERY      Family History  Problem Relation Age of Onset  . Diabetes Daughter   . Hypertension Daughter   . Fibromyalgia Daughter   . GER disease Daughter   . Fibromyalgia Daughter   . Crohn's disease Daughter   . Asthma Daughter     No Known Allergies  Current Outpatient Prescriptions on File Prior to Visit  Medication Sig Dispense Refill  . albuterol (PROVENTIL HFA;VENTOLIN HFA) 108 (90 Base) MCG/ACT inhaler Inhale 1 puff into the lungs 4 (four) times daily. 1 Inhaler 0  . anastrozole (ARIMIDEX) 1 MG tablet Take 1 mg by mouth daily.    . cetirizine (ZYRTEC) 10 MG chewable tablet Chew 10 mg by mouth daily.    [name] gabapentin (NEURONTIN) 600 MG tablet Take 600 mg by mouth 4 (four) times daily.    [name] guaiFENesin (MUCINEX) 600 MG 12 hr tablet Take 1 tablet (600 mg total) by mouth 2 (two) times daily. 15 tablet 0  . levothyroxine (SYNTHROID, LEVOTHROID) 125 MCG tablet Take 125 mcg by mouth at bedtime.    . meloxicam (MOBIC) 15 MG tablet Take 15 mg by mouth daily.    . metFORMIN (GLUCOPHAGE) 500 MG tablet Take 500 mg by mouth 2 (two) times daily with a meal.    . omeprazole (PRILOSEC) 20 MG capsule Take 20 mg by mouth 2 (two) times daily.    . pravastatin (PRAVACHOL) 40 MG tablet Take 40 mg by mouth at bedtime.    . triamterene-hydrochlorothiazide (DYAZIDE) 37.5-25 MG per capsule Take 1 capsule by mouth every morning.     No current facility-administered medications on file prior to visit.     BP 122/76   Pulse 65   Temp 98.2 F (36.8 C) (Oral)   Ht 5' 2.25" (1.581 m)   Wt 172 lb 12.8 oz (78.4 kg)   SpO2 98%   BMI 31.35 kg/m    Objective:   Physical Exam  Constitutional: She is oriented to  person, place, and time. She appears well-nourished.  Eyes: Pupils are equal, round, and reactive to light.  Neck: Neck supple.  Cardiovascular: Normal rate and regular rhythm.   Pulmonary/Chest: Effort normal and breath sounds normal.  Musculoskeletal:  Upper and lower extremity strength 5/5 bilaterally. Generalized body stiffness to trunk, upper, and lower extremities. No facial drooping, arm drift, slurred speech.  Neurological: She is alert and oriented to person, place, and time. No cranial nerve deficit.  Skin: Skin is warm and dry.  Psychiatric: She has a normal mood and affect.          Assessment & Plan:

## 2016-12-06 NOTE — Assessment & Plan Note (Signed)
Diagnosed in 2015, right breast, lumpectomy at that time. No chemo or radiation. Managed on anastrozole. No recent oncology follow up or mammogram. Referral placed to oncology for follow up.

## 2016-12-06 NOTE — Assessment & Plan Note (Signed)
BP stable off of triamterene-HCTZ. Continue propranolol 80 mg, [place] for chronic headaches. Daughter will continue to monitor.

## 2016-12-06 NOTE — Assessment & Plan Note (Signed)
Intermittent for 3-4 months, difficulty with word finding over last 2 months. Exam today negative for acute CVA. She is alert and oriented, actively participating in HPI. Referral placed to Neurology per family request as they'd like a complete Dementia screen.

## 2016-12-06 NOTE — Assessment & Plan Note (Signed)
Unsure of last TSH, TSH pending today. Continue levothyroxine 125 mcg for now.

## 2016-12-06 NOTE — Assessment & Plan Note (Signed)
Unsure of last A1C result, A1C pending today. Continue Metformin for now. Check renal function.

## 2016-12-06 NOTE — Assessment & Plan Note (Signed)
Removed from chin in February 2018. Referral placed to dermatology for follow up.

## 2016-12-06 NOTE — Assessment & Plan Note (Signed)
Failed treatment on Topamax, amitriptyline, propranolol, Imitrex. Referral placed to Neurology for further evaluation.

## 2016-12-06 NOTE — Patient Instructions (Addendum)
You will be contacted regarding your referral to Dermatology, Neurology, Opthalmology and Oncology.  Please let us know if you have not heard back within one week.   Complete lab work prior to leaving today. I will notify you of your results once received.   I will be in touch with you regarding your next follow up visit once I receive your labs.  It was a pleasure to meet you today! Please don't hesitate to call me with any questions. Welcome to [facility]!

## 2016-12-06 NOTE — Assessment & Plan Note (Signed)
Bilateral eyes per patient. Managed on three eye drops. Referral placed for local ophthalmologist.

## 2016-12-06 NOTE — Assessment & Plan Note (Signed)
Overall stable on Cymbalta and gabapentin, continue same.

## 2016-12-09 ENCOUNTER — Encounter: Payer: Self-pay | Admitting: Neurology

## 2016-12-14 ENCOUNTER — Telehealth: Payer: Self-pay | Admitting: Primary Care

## 2016-12-14 NOTE — Telephone Encounter (Signed)
Patient returned Chan's call. °

## 2016-12-14 NOTE — Telephone Encounter (Signed)
Spoken and notified patient of Kate's comments. Patient verbalized understanding. 

## 2016-12-15 ENCOUNTER — Ambulatory Visit (INDEPENDENT_AMBULATORY_CARE_PROVIDER_SITE_OTHER): Payer: Medicare HMO | Admitting: Family Medicine

## 2016-12-15 ENCOUNTER — Encounter: Payer: Self-pay | Admitting: Family Medicine

## 2016-12-15 ENCOUNTER — Other Ambulatory Visit: Payer: Self-pay | Admitting: Primary Care

## 2016-12-15 DIAGNOSIS — E039 Hypothyroidism, unspecified: Secondary | ICD-10-CM

## 2016-12-15 DIAGNOSIS — J329 Chronic sinusitis, unspecified: Secondary | ICD-10-CM | POA: Diagnosis not present

## 2016-12-15 MED ORDER — FLUTICASONE PROPIONATE 50 MCG/ACT NA SUSP: 2.0000 | Freq: Every day | NASAL | 3 refills | Status: DC

## 2016-12-15 MED ORDER — LEVOTHYROXINE SODIUM 150 MCG PO TABS: ORAL_TABLET | ORAL | 0 refills | Status: DC

## 2016-12-15 NOTE — Progress Notes (Signed)
H/o migraine/HA.  That has gotten some better in the meantime. Cough with sputum, fatigue.  She feels some better today.  She has a tendency to chronic sinus sx at baseline but the last few weeks were worse.  She had been off flonase and zyrtec for a little while, but restarted in the meantime.  No fevers.  No vomiting, no diarrhea.  No ear pain usually some occ L ear pain.  Some occ wheeze, not consistent.    H/o 4 sinus surgeries.    She is on higher dose of thyroid med in the meantime.  Unclear how much the TSH of 18 contributed to the fatigue.    Meds, vitals, and allergies reviewed.   ROS: Per HPI unless specifically indicated in ROS section   GEN: nad, alert and oriented HEENT: mucous membranes moist, tm w/o erythema, nasal exam w/o erythema, clear discharge noted,  OP with cobblestoning, sinuses very minimally ttp x4 NECK: supple w/o LA CV: rrr.   PULM: ctab, no inc wob EXT: no edema

## 2016-12-15 NOTE — Patient Instructions (Signed)
Use nasal saline, keep using flonase and zyrtec, and update [place] as needed.  Take care.  Glad to see you.

## 2016-12-16 DIAGNOSIS — J019 Acute sinusitis, unspecified: Secondary | ICD-10-CM | POA: Insufficient documentation

## 2016-12-16 DIAGNOSIS — J329 Chronic sinusitis, unspecified: Secondary | ICD-10-CM

## 2016-12-16 NOTE — Assessment & Plan Note (Signed)
She has had 4 sinus surgeries and has chronic sinus pressure and symptoms. She had worsening symptoms recently but feels some better today. She has restarted Flonase and Zyrtec in the meantime. Continue both as is. She has some fatigue that could be attributed to her hypothyroidism. This is in the midst of treatment. Given her benign exam today and her recent improvement, would not start antibiotics. Update [place] as needed. She agrees.

## 2016-12-22 ENCOUNTER — Telehealth: Payer: Self-pay

## 2016-12-22 NOTE — Telephone Encounter (Signed)
Shannon Higgins left v/m;(do not see DPR) pt was seen 12/15/16; initially pt got better but now pt is still prod coughing with thick brown phlegm, nasal drainage and S/T. [name] request abx to [facility]. Pt also does well with steroid when has sinus infections. [name] request cb.

## 2016-12-23 MED ORDER — AMOXICILLIN-POT CLAVULANATE 875-125 MG PO TABS: 1.0000 | ORAL_TABLET | Freq: Two times a day (BID) | ORAL | 0 refills | Status: DC

## 2016-12-23 NOTE — Telephone Encounter (Signed)
augmentin rx sent.  Would start that now.  Would not start prednisone yet.  Would try to limit oral steroids.  Routed to PCP as FYI.  Thanks.

## 2016-12-23 NOTE — Telephone Encounter (Signed)
[name] (daughter) advised.

## 2017-01-06 ENCOUNTER — Other Ambulatory Visit: Payer: Self-pay | Admitting: Primary Care

## 2017-01-06 DIAGNOSIS — E785 Hyperlipidemia, unspecified: Secondary | ICD-10-CM

## 2017-01-06 DIAGNOSIS — E039 Hypothyroidism, unspecified: Secondary | ICD-10-CM

## 2017-01-06 DIAGNOSIS — H524 Presbyopia: Secondary | ICD-10-CM | POA: Diagnosis not present

## 2017-01-06 DIAGNOSIS — H401133 Primary open-angle glaucoma, bilateral, severe stage: Secondary | ICD-10-CM | POA: Diagnosis not present

## 2017-01-06 LAB — HM DIABETES EYE EXAM

## 2017-01-10 ENCOUNTER — Telehealth: Payer: Self-pay

## 2017-01-10 ENCOUNTER — Encounter: Payer: Self-pay | Admitting: Primary Care

## 2017-01-10 DIAGNOSIS — D225 Melanocytic nevi of trunk: Secondary | ICD-10-CM | POA: Diagnosis not present

## 2017-01-10 DIAGNOSIS — L820 Inflamed seborrheic keratosis: Secondary | ICD-10-CM | POA: Diagnosis not present

## 2017-01-10 DIAGNOSIS — D485 Neoplasm of uncertain behavior of skin: Secondary | ICD-10-CM | POA: Diagnosis not present

## 2017-01-10 DIAGNOSIS — L309 Dermatitis, unspecified: Secondary | ICD-10-CM | POA: Diagnosis not present

## 2017-01-10 DIAGNOSIS — L814 Other melanin hyperpigmentation: Secondary | ICD-10-CM | POA: Diagnosis not present

## 2017-01-10 DIAGNOSIS — L989 Disorder of the skin and subcutaneous tissue, unspecified: Secondary | ICD-10-CM | POA: Diagnosis not present

## 2017-01-10 DIAGNOSIS — D1801 Hemangioma of skin and subcutaneous tissue: Secondary | ICD-10-CM | POA: Diagnosis not present

## 2017-01-10 DIAGNOSIS — L570 Actinic keratosis: Secondary | ICD-10-CM | POA: Diagnosis not present

## 2017-01-10 DIAGNOSIS — L821 Other seborrheic keratosis: Secondary | ICD-10-CM | POA: Diagnosis not present

## 2017-01-10 NOTE — Telephone Encounter (Signed)
[name] (do not see DPR signed) left v/m; pt established with Gentry Fitz NP on 12/06/16. Pt back pain has worsened; pt has hx of back injections due to severe arthritis  and request either referral with ortho or rheumatology.Please advise.

## 2017-01-10 NOTE — Telephone Encounter (Signed)
Please schedule patient for an appointment for evaluation. Sounds like we may need to do some imaging. I'm happy to place a referral but I need more information so I can get her to the right place.

## 2017-01-11 ENCOUNTER — Other Ambulatory Visit: Payer: Medicare HMO

## 2017-01-11 ENCOUNTER — Telehealth: Payer: Self-pay | Admitting: Primary Care

## 2017-01-11 NOTE — Telephone Encounter (Signed)
Patient Name: Shannon Higgins DOB: 01/04/1935 Initial Comment caller states mother is confused and her urine has an odor. Nurse Assessment Nurse: Ronnald Ramp, RN, Miranda Date/Time (Eastern Time): 01/11/2017 2:44:28 PM Confirm and document reason for call. If symptomatic, describe symptoms. ---Caller states her mother has had back pain for 3 days. They called the office and has referral for imaging. Now her urine has an odor, she has fatigue, and slight confusion. Does the patient have any new or worsening symptoms? ---Yes Will a triage be completed? ---Yes Related visit to physician within the last 2 weeks? ---No Does the PT have any chronic conditions? (i.e. diabetes, asthma, etc.) ---Yes List chronic conditions. ---Diabetes, Arthritis, Chronic pain, Neuropathy, Thyroid, allergies Is this a behavioral health or substance abuse call? ---No Guidelines Guideline Title Affirmed Question Affirmed Notes Urinary Symptoms Side (flank) or lower back pain present Final Disposition User See Physician within 24 Hours Jones, RN, Miranda Comments Caller states they have an appt on Friday and she was wanting antibiotics called in today. Told caller that due to symptoms, they would need to get an urine sample. Offered to schedule appt but caller declined. She states she will have to call back later. Referrals REFERRED TO PCP OFFICE Disagree/Comply: Comply

## 2017-01-11 NOTE — Telephone Encounter (Signed)
Message left for patient to return my call.  

## 2017-01-11 NOTE — Telephone Encounter (Signed)
Noted. Patient has an appointment scheduled this week.

## 2017-01-11 NOTE — Telephone Encounter (Signed)
Patient has appt on 01/13/2017

## 2017-01-13 ENCOUNTER — Encounter: Payer: Self-pay | Admitting: Primary Care

## 2017-01-13 ENCOUNTER — Ambulatory Visit: Payer: Medicare HMO | Admitting: Primary Care

## 2017-01-13 ENCOUNTER — Other Ambulatory Visit: Payer: Self-pay | Admitting: Primary Care

## 2017-01-13 ENCOUNTER — Ambulatory Visit (INDEPENDENT_AMBULATORY_CARE_PROVIDER_SITE_OTHER): Payer: Medicare HMO | Admitting: Primary Care

## 2017-01-13 ENCOUNTER — Ambulatory Visit (INDEPENDENT_AMBULATORY_CARE_PROVIDER_SITE_OTHER)
Admission: RE | Admit: 2017-01-13 | Discharge: 2017-01-13 | Disposition: A | Discharge status: Home / Self Care | Payer: Medicare HMO | Source: Ambulatory Visit | Attending: Primary Care | Admitting: Primary Care

## 2017-01-13 VITALS — BP 120/72 | HR 82 | Temp 98.2°F | Wt 165.0 lb

## 2017-01-13 DIAGNOSIS — M5440 Lumbago with sciatica, unspecified side: Secondary | ICD-10-CM

## 2017-01-13 DIAGNOSIS — R531 Weakness: Secondary | ICD-10-CM

## 2017-01-13 DIAGNOSIS — R109 Unspecified abdominal pain: Secondary | ICD-10-CM | POA: Diagnosis not present

## 2017-01-13 DIAGNOSIS — G8929 Other chronic pain: Secondary | ICD-10-CM

## 2017-01-13 DIAGNOSIS — R2689 Other abnormalities of gait and mobility: Secondary | ICD-10-CM

## 2017-01-13 DIAGNOSIS — M545 Low back pain, unspecified: Secondary | ICD-10-CM | POA: Insufficient documentation

## 2017-01-13 DIAGNOSIS — R296 Repeated falls: Secondary | ICD-10-CM

## 2017-01-13 LAB — POC URINALSYSI DIPSTICK (AUTOMATED)
BILIRUBIN UA: NEGATIVE
GLUCOSE UA: NEGATIVE
Ketones, UA: NEGATIVE
Nitrite, UA: NEGATIVE
Protein, UA: NEGATIVE
RBC UA: NEGATIVE
SPEC GRAV UA: 1.025 (ref 1.010–1.025)
Urobilinogen, UA: 0.2 E.U./dL
pH, UA: 6 (ref 5.0–8.0)

## 2017-01-13 MED ORDER — CEPHALEXIN 500 MG PO CAPS: 500.0000 mg | ORAL_CAPSULE | Freq: Two times a day (BID) | ORAL | 0 refills | Status: DC

## 2017-01-13 NOTE — Progress Notes (Signed)
Subjective:    Patient ID: Shannon Higgins. Shannon Higgins, female    DOB: April 23, 1935, 82 y.o.   MRN: [id number]  HPI  [name] is a 82 year old female with a history of type 2 diabetes, hypothyroidism, hyperlipidemia, neuropathic pain, breast cancer who presents today with a chief complaint of right lateral side pain. Her side pain has been present for the past 1 week. She also reports foul smelling urine. Her daughter reports mild confusion, decrease in appetite, sleeping more often.   She also reports generalized abdominal pain that she describes as intermittent cramping. She denies diarrhea, has had some constipation but taking Miralax with improvement. Mild nausea, no vomiting.  She denies dysuria, hematuria, fevers, diarrhea, flank pain.  2) Chronic Back Pain: Located to the mid thoracic and lumbar spine with intermittent radiation of pain to her lower extremities. History of back surgery to lumbar spine about 5 years ago. She will take Meloxicam and Ibuprofen with temporary improvement. Daughter and mother endorse recurrent falls within the last several months. No recent imaging of lumbar spine. She is home bound due to weakness, glaucoma, and doesn't drive.  Review of Systems  Constitutional: Negative for fever.  Respiratory: Negative for shortness of breath.   Cardiovascular: Negative for chest pain.  Gastrointestinal: Positive for abdominal pain and nausea. Negative for diarrhea and vomiting.  Genitourinary: Negative for dysuria, flank pain, frequency and hematuria.  Musculoskeletal: Positive for back pain.       Past Medical History:  Diagnosis Date  . Acute kidney injury ([place])   . Asthma   . Breast cancer ([place])   . Chronic sinusitis   . Diabetes mellitus without complication ([place])   . GERD (gastroesophageal reflux disease)   . History of recurrent UTIs   . Hypertension   . Insomnia   . Migraine   . Neuropathic pain   . Pneumonia   . Rheumatoid arthritis ([place])   . Sleep apnea     . Thyroid disease      Social History   Social History  . Marital status: Widowed    Spouse name: N/A  . Number of children: N/A  . Years of education: N/A   Occupational History  . Not on file.   Social History Main Topics  . Smoking status: Never Smoker  . Smokeless tobacco: Never Used  . Alcohol use No  . Drug use: No  . Sexual activity: Not on file   Other Topics Concern  . Not on file   Social History Narrative  . No narrative on file    Past Surgical History:  Procedure Laterality Date  . ABDOMINAL HYSTERECTOMY    . BACK SURGERY    . CHOLECYSTECTOMY    . KNEE SURGERY    . SHOULDER SURGERY      Family History  Problem Relation Age of Onset  . Diabetes Daughter   . Hypertension Daughter   . Fibromyalgia Daughter   . GER disease Daughter   . Fibromyalgia Daughter   . Crohn's disease Daughter   . Asthma Daughter     No Known Allergies  Current Outpatient Prescriptions on File Prior to Visit  Medication Sig Dispense Refill  . amoxicillin (AMOXIL) 500 MG capsule Take 4 capsules by mouth one hour prior to appiontment    . amoxicillin-clavulanate (AUGMENTIN) 875-125 MG tablet Take 1 tablet by mouth 2 (two) times daily. 20 tablet 0  . anastrozole (ARIMIDEX) 1 MG tablet Take 1 mg by mouth daily.    [name]  brimonidine (ALPHAGAN) 0.2 % ophthalmic solution Place 1 drop into both eyes 3 (three) times daily.    . cetirizine (ZYRTEC) 10 MG chewable tablet Chew 10 mg by mouth daily.    . DULoxetine (CYMBALTA) 60 MG capsule Take 60 mg by mouth daily.    . fluticasone (FLONASE) 50 MCG/ACT nasal spray Place 2 sprays into both nostrils daily. 48 g 3  . gabapentin (NEURONTIN) 600 MG tablet Take 600 mg by mouth 4 (four) times daily.    [name] guaiFENesin (MUCINEX) 600 MG 12 hr tablet Take 1 tablet (600 mg total) by mouth 2 (two) times daily. 15 tablet 0  . latanoprost (XALATAN) 0.005 % ophthalmic solution Place 1 drop into both eyes at bedtime.    [name] levothyroxine (SYNTHROID) 150  MCG tablet Take 1 tablet by mouth every morning on an empty stomach with a full glass of water. 90 tablet 0  . meloxicam (MOBIC) 15 MG tablet Take 15 mg by mouth daily.    . metFORMIN (GLUCOPHAGE) 500 MG tablet Take 500 mg by mouth 2 (two) times daily with a meal.    . omeprazole (PRILOSEC) 20 MG capsule Take 20 mg by mouth 2 (two) times daily.    . pravastatin (PRAVACHOL) 40 MG tablet Take 40 mg by mouth at bedtime.    . propranolol (INDERAL) 80 MG tablet Take 80 mg by mouth daily.     No current facility-administered medications on file prior to visit.     BP 120/72   Pulse 82   Temp 98.2 F (36.8 C) (Oral)   Wt 165 lb (74.8 kg)   SpO2 95%   BMI 29.94 kg/m    Objective:   Physical Exam  Constitutional: She is oriented to person, place, and time. She appears well-nourished.  Neck: Neck supple.  Cardiovascular: Normal rate and regular rhythm.   Pulmonary/Chest: Effort normal and breath sounds normal.  Musculoskeletal:       Lumbar back: She exhibits decreased range of motion and pain. She exhibits no tenderness.       Back:  Neurological: She is alert and oriented to person, place, and time.  Skin: Skin is warm and dry.  Psychiatric: She has a normal mood and affect.          Assessment & Plan:  Acute Cystitis:  Overall asymptomatic except for right side pain. Exam today without CVA tenderness. UA: 3+ leuks, negative nitrites, negative blood. Culture sent. Rx for Keflex course sent to pharmacy. Discussed to push fluids. Follow up if no improvement in 3-4 days.  Sheral Flow, NP

## 2017-01-13 NOTE — Assessment & Plan Note (Signed)
Chronic for years, surgical intervention 5 years ago. Given recurrent falls will obtain imaging of lumbar spine. Overall ambulatory in clinic with daughter at side. No alarm signs. Consider ortho vs PT referral based off of xray results.

## 2017-01-13 NOTE — Patient Instructions (Signed)
Complete xray(s) prior to leaving today. I will notify you of your results once received.  Start Cephalexin antibiotics. Take 1 capsule by mouth twice daily for 7 days.  Ensure you are staying hydrated with water.  It was a pleasure to see you today!

## 2017-01-16 LAB — URINE CULTURE
MICRO NUMBER:: 81017528
SPECIMEN QUALITY:: ADEQUATE

## 2017-01-17 ENCOUNTER — Other Ambulatory Visit: Payer: Self-pay | Admitting: Primary Care

## 2017-01-17 ENCOUNTER — Telehealth: Payer: Self-pay | Admitting: Primary Care

## 2017-01-17 DIAGNOSIS — R42 Dizziness and giddiness: Secondary | ICD-10-CM

## 2017-01-17 DIAGNOSIS — N3000 Acute cystitis without hematuria: Secondary | ICD-10-CM

## 2017-01-17 MED ORDER — SULFAMETHOXAZOLE-TRIMETHOPRIM 800-160 MG PO TABS: 1.0000 | ORAL_TABLET | Freq: Two times a day (BID) | ORAL | 0 refills | Status: DC

## 2017-01-17 MED ORDER — MECLIZINE HCL 25 MG PO TABS: 25.0000 mg | ORAL_TABLET | Freq: Two times a day (BID) | ORAL | 0 refills | Status: DC | PRN

## 2017-01-17 NOTE — Telephone Encounter (Signed)
Caller Name:[name] Ladell Pier Relationship to Patient: Best number: Pharmacy:  Reason for call:  Shannon Higgins is returning your call, please call back at the same number

## 2017-01-17 NOTE — Telephone Encounter (Signed)
Spoken and notified patient's daughter of Kate's comments. Patient's duaghter verbalized understanding.

## 2017-01-19 DIAGNOSIS — R296 Repeated falls: Secondary | ICD-10-CM | POA: Diagnosis not present

## 2017-01-19 DIAGNOSIS — E119 Type 2 diabetes mellitus without complications: Secondary | ICD-10-CM | POA: Diagnosis not present

## 2017-01-19 DIAGNOSIS — M069 Rheumatoid arthritis, unspecified: Secondary | ICD-10-CM | POA: Diagnosis not present

## 2017-01-19 DIAGNOSIS — I10 Essential (primary) hypertension: Secondary | ICD-10-CM | POA: Diagnosis not present

## 2017-01-19 DIAGNOSIS — D649 Anemia, unspecified: Secondary | ICD-10-CM | POA: Diagnosis not present

## 2017-01-19 DIAGNOSIS — C50919 Malignant neoplasm of unspecified site of unspecified female breast: Secondary | ICD-10-CM | POA: Diagnosis not present

## 2017-01-19 DIAGNOSIS — N3000 Acute cystitis without hematuria: Secondary | ICD-10-CM | POA: Diagnosis not present

## 2017-01-19 DIAGNOSIS — G8929 Other chronic pain: Secondary | ICD-10-CM | POA: Diagnosis not present

## 2017-01-19 DIAGNOSIS — M5440 Lumbago with sciatica, unspecified side: Secondary | ICD-10-CM | POA: Diagnosis not present

## 2017-01-20 ENCOUNTER — Telehealth: Payer: Self-pay

## 2017-01-20 NOTE — Telephone Encounter (Signed)
Notified Alan with [facility] of the approval of the verbal orders.

## 2017-01-20 NOTE — Telephone Encounter (Signed)
Approved.  

## 2017-01-20 NOTE — Telephone Encounter (Signed)
PT with [facility] left v/m requesting verbal orders for [facility] PT 2 x a week for 3 weeks and 1 x a week for 1 week for strengthening,balance and safety.

## 2017-01-23 DIAGNOSIS — G8929 Other chronic pain: Secondary | ICD-10-CM | POA: Diagnosis not present

## 2017-01-23 DIAGNOSIS — M5440 Lumbago with sciatica, unspecified side: Secondary | ICD-10-CM | POA: Diagnosis not present

## 2017-01-23 DIAGNOSIS — N3000 Acute cystitis without hematuria: Secondary | ICD-10-CM | POA: Diagnosis not present

## 2017-01-23 DIAGNOSIS — I10 Essential (primary) hypertension: Secondary | ICD-10-CM | POA: Diagnosis not present

## 2017-01-23 DIAGNOSIS — E119 Type 2 diabetes mellitus without complications: Secondary | ICD-10-CM | POA: Diagnosis not present

## 2017-01-23 DIAGNOSIS — R296 Repeated falls: Secondary | ICD-10-CM | POA: Diagnosis not present

## 2017-01-23 DIAGNOSIS — C50919 Malignant neoplasm of unspecified site of unspecified female breast: Secondary | ICD-10-CM | POA: Diagnosis not present

## 2017-01-23 DIAGNOSIS — D649 Anemia, unspecified: Secondary | ICD-10-CM | POA: Diagnosis not present

## 2017-01-23 DIAGNOSIS — M069 Rheumatoid arthritis, unspecified: Secondary | ICD-10-CM | POA: Diagnosis not present

## 2017-01-25 DIAGNOSIS — D649 Anemia, unspecified: Secondary | ICD-10-CM | POA: Diagnosis not present

## 2017-01-25 DIAGNOSIS — N3000 Acute cystitis without hematuria: Secondary | ICD-10-CM | POA: Diagnosis not present

## 2017-01-25 DIAGNOSIS — I10 Essential (primary) hypertension: Secondary | ICD-10-CM | POA: Diagnosis not present

## 2017-01-25 DIAGNOSIS — E119 Type 2 diabetes mellitus without complications: Secondary | ICD-10-CM | POA: Diagnosis not present

## 2017-01-25 DIAGNOSIS — G8929 Other chronic pain: Secondary | ICD-10-CM | POA: Diagnosis not present

## 2017-01-25 DIAGNOSIS — C50919 Malignant neoplasm of unspecified site of unspecified female breast: Secondary | ICD-10-CM | POA: Diagnosis not present

## 2017-01-25 DIAGNOSIS — M5440 Lumbago with sciatica, unspecified side: Secondary | ICD-10-CM | POA: Diagnosis not present

## 2017-01-25 DIAGNOSIS — M069 Rheumatoid arthritis, unspecified: Secondary | ICD-10-CM | POA: Diagnosis not present

## 2017-01-25 DIAGNOSIS — R296 Repeated falls: Secondary | ICD-10-CM | POA: Diagnosis not present

## 2017-01-31 DIAGNOSIS — N3000 Acute cystitis without hematuria: Secondary | ICD-10-CM | POA: Diagnosis not present

## 2017-01-31 DIAGNOSIS — M5440 Lumbago with sciatica, unspecified side: Secondary | ICD-10-CM | POA: Diagnosis not present

## 2017-01-31 DIAGNOSIS — G8929 Other chronic pain: Secondary | ICD-10-CM | POA: Diagnosis not present

## 2017-01-31 DIAGNOSIS — E119 Type 2 diabetes mellitus without complications: Secondary | ICD-10-CM | POA: Diagnosis not present

## 2017-02-01 DIAGNOSIS — E119 Type 2 diabetes mellitus without complications: Secondary | ICD-10-CM | POA: Diagnosis not present

## 2017-02-01 DIAGNOSIS — M069 Rheumatoid arthritis, unspecified: Secondary | ICD-10-CM | POA: Diagnosis not present

## 2017-02-01 DIAGNOSIS — G8929 Other chronic pain: Secondary | ICD-10-CM | POA: Diagnosis not present

## 2017-02-01 DIAGNOSIS — C50919 Malignant neoplasm of unspecified site of unspecified female breast: Secondary | ICD-10-CM | POA: Diagnosis not present

## 2017-02-01 DIAGNOSIS — I10 Essential (primary) hypertension: Secondary | ICD-10-CM | POA: Diagnosis not present

## 2017-02-01 DIAGNOSIS — D649 Anemia, unspecified: Secondary | ICD-10-CM | POA: Diagnosis not present

## 2017-02-01 DIAGNOSIS — M5440 Lumbago with sciatica, unspecified side: Secondary | ICD-10-CM | POA: Diagnosis not present

## 2017-02-01 DIAGNOSIS — R296 Repeated falls: Secondary | ICD-10-CM | POA: Diagnosis not present

## 2017-02-01 DIAGNOSIS — N3000 Acute cystitis without hematuria: Secondary | ICD-10-CM | POA: Diagnosis not present

## 2017-02-03 DIAGNOSIS — C50919 Malignant neoplasm of unspecified site of unspecified female breast: Secondary | ICD-10-CM | POA: Diagnosis not present

## 2017-02-03 DIAGNOSIS — D649 Anemia, unspecified: Secondary | ICD-10-CM | POA: Diagnosis not present

## 2017-02-03 DIAGNOSIS — I10 Essential (primary) hypertension: Secondary | ICD-10-CM | POA: Diagnosis not present

## 2017-02-03 DIAGNOSIS — E119 Type 2 diabetes mellitus without complications: Secondary | ICD-10-CM | POA: Diagnosis not present

## 2017-02-03 DIAGNOSIS — M5440 Lumbago with sciatica, unspecified side: Secondary | ICD-10-CM | POA: Diagnosis not present

## 2017-02-03 DIAGNOSIS — N3000 Acute cystitis without hematuria: Secondary | ICD-10-CM | POA: Diagnosis not present

## 2017-02-03 DIAGNOSIS — G8929 Other chronic pain: Secondary | ICD-10-CM | POA: Diagnosis not present

## 2017-02-03 DIAGNOSIS — M069 Rheumatoid arthritis, unspecified: Secondary | ICD-10-CM | POA: Diagnosis not present

## 2017-02-03 DIAGNOSIS — R296 Repeated falls: Secondary | ICD-10-CM | POA: Diagnosis not present

## 2017-02-06 ENCOUNTER — Telehealth: Payer: Self-pay | Admitting: Neurology

## 2017-02-06 NOTE — Telephone Encounter (Signed)
Daughter called to make sure that her mother's records were faxed over to Dr. Delice Lesch for her visit on 02/08/17. Please Advise. Thanks

## 2017-02-06 NOTE — Telephone Encounter (Signed)
All referring notes, labs and imaging is in [facility]

## 2017-02-08 ENCOUNTER — Ambulatory Visit (INDEPENDENT_AMBULATORY_CARE_PROVIDER_SITE_OTHER): Payer: Medicare HMO | Admitting: Neurology

## 2017-02-08 ENCOUNTER — Encounter: Payer: Self-pay | Admitting: Neurology

## 2017-02-08 VITALS — BP 124/72 | HR 64 | Resp 20 | Ht 63.0 in | Wt 166.0 lb

## 2017-02-08 DIAGNOSIS — G43709 Chronic migraine without aura, not intractable, without status migrainosus: Secondary | ICD-10-CM

## 2017-02-08 DIAGNOSIS — G3184 Mild cognitive impairment, so stated: Secondary | ICD-10-CM

## 2017-02-08 DIAGNOSIS — R42 Dizziness and giddiness: Secondary | ICD-10-CM

## 2017-02-08 DIAGNOSIS — IMO0002 Reserved for concepts with insufficient information to code with codable children: Secondary | ICD-10-CM

## 2017-02-08 MED ORDER — DONEPEZIL HCL 10 MG PO TABS: ORAL_TABLET | ORAL | 11 refills | Status: DC

## 2017-02-08 NOTE — Progress Notes (Signed)
NEUROLOGY CONSULTATION NOTE  Shannon Higgins. Eich MRN: [id number] DOB: [birth year]  Referring provider: Alma Friendly, NP Primary care provider: Alma Friendly, NP  Reason for consult:  Headaches, confusion  Thank you for your kind referral of Shannon Higgins. [place] for consultation of the above symptoms. Although her history is well known to you, please allow me to reiterate it for the purpose of our medical record. The patient was accompanied to the clinic by her daughter Shannon Higgins who also provides collateral information. Records and images were personally reviewed where available.  HISTORY OF PRESENT ILLNESS: This is a 82 year old right-handed woman with a history of hypertension, diabetes, rheumatoid arthritis, breast cancer, presenting for evaluation of chronic headaches and confusion.   1. Headaches Headaches started in her 23s. She went for a period of time with no significant headaches for a year, then last June headaches recurred lasting for weeks at a time. She has 20 to 25 headache days a month, with nausea, upset stomach, sometimes diarrhea. She describes a lot of pressure and throbbing. She had a headache that lasted for 2 months, resolving 2 weeks ago, then this weekend headaches started back and have been ongoing for 4-5 days. She denies any visual obscurations, no photo/phonophobia. She does not usually have dizziness with the headaches, but recently has been so dizzy she had difficulty getting to the bathroom the past week, with described spinning sensation. Meclizine helps some. She has had vertigo in the past. No family history of migraines. She has tried several headache preventative medications, including amitriptyline, Topiramate. She is taking Propranolol 80mg  daily with no side effects. She is also on Neurontin for back pain and Cymbalta for mood.   2. Confusion. She reports word-finding difficulties for the past 6 months. Her daughter mostly describes the episodic confusion as  forgetting what she had previously told the patient. She has been taking an old meclizine prescription, but forgot that her daughter told her this information. She has not been driving for the past month due to glaucoma, denied previously getting lost driving. After her husband passed away 1.5 years ago, she moved in with her daughter and son-in-law. They are in charge of finances. Her daughter states she forgets her medications. One time she forgot to take her Cymbalta for a week. No paranoia or hallucinations. She used to be a social butterfly but now likes to be more at home. There is a strong family history of dementia in her mother and multiple siblings.   She denies any diplopia, dysarthria/dysphagia, anosmia, or tremors. She has chronic neck and back pain and neuropathy in both feet radiating up her legs. She has a little urinary incontinence, no bowel issues. They report several falls with the dizziness. After she hit her head with fall in July, she had a bad headache for 6 weeks. She was veering to the left and went to the ER on 7/13 where head CT did not show any acute changes. She got a migraine cocktail but headache was still bad the next day.   Lab Results  Component Value Date   TSH 18.40 (H) 12/06/2016     PAST MEDICAL HISTORY: Past Medical History:  Diagnosis Date  . Acute kidney injury ([place])   . Asthma   . Breast cancer ([place])   . Chronic sinusitis   . Diabetes mellitus without complication ([place])   . GERD (gastroesophageal reflux disease)   . History of recurrent UTIs   . Hypertension   . Insomnia   .  Migraine   . Neuropathic pain   . Pneumonia   . Rheumatoid arthritis ([place])   . Sleep apnea   . Thyroid disease     PAST SURGICAL HISTORY: Past Surgical History:  Procedure Laterality Date  . ABDOMINAL HYSTERECTOMY    . BACK SURGERY    . CHOLECYSTECTOMY    . KNEE SURGERY    . SHOULDER SURGERY      MEDICATIONS: Current Outpatient Prescriptions on File Prior to  Visit  Medication Sig Dispense Refill  . amoxicillin (AMOXIL) 500 MG capsule Take 4 capsules by mouth one hour prior to appiontment    . anastrozole (ARIMIDEX) 1 MG tablet Take 1 mg by mouth daily.    . brimonidine (ALPHAGAN) 0.2 % ophthalmic solution Place 1 drop into both eyes 3 (three) times daily.    . cetirizine (ZYRTEC) 10 MG chewable tablet Chew 10 mg by mouth daily.    . DULoxetine (CYMBALTA) 60 MG capsule Take 60 mg by mouth daily.    . fluticasone (FLONASE) 50 MCG/ACT nasal spray Place 2 sprays into both nostrils daily. 48 g 3  . gabapentin (NEURONTIN) 600 MG tablet Take 600 mg by mouth 4 (four) times daily.    [name] guaiFENesin (MUCINEX) 600 MG 12 hr tablet Take 1 tablet (600 mg total) by mouth 2 (two) times daily. 15 tablet 0  . latanoprost (XALATAN) 0.005 % ophthalmic solution Place 1 drop into both eyes at bedtime.    [name] levothyroxine (SYNTHROID) 150 MCG tablet Take 1 tablet by mouth every morning on an empty stomach with a full glass of water. 90 tablet 0  . meclizine (ANTIVERT) 25 MG tablet Take 1 tablet (25 mg total) by mouth 2 (two) times daily as needed for dizziness. 180 tablet 0  . meloxicam (MOBIC) 15 MG tablet Take 15 mg by mouth daily.    . metFORMIN (GLUCOPHAGE) 500 MG tablet Take 500 mg by mouth 2 (two) times daily with a meal.    . omeprazole (PRILOSEC) 20 MG capsule Take 20 mg by mouth 2 (two) times daily.    . pravastatin (PRAVACHOL) 40 MG tablet Take 40 mg by mouth at bedtime.    . propranolol (INDERAL) 80 MG tablet Take 80 mg by mouth daily.    [name] sulfamethoxazole-trimethoprim (BACTRIM DS,SEPTRA DS) 800-160 MG tablet Take 1 tablet by mouth 2 (two) times daily. 10 tablet 0   No current facility-administered medications on file prior to visit.     ALLERGIES: No Known Allergies  FAMILY HISTORY: Family History  Problem Relation Age of Onset  . Diabetes Daughter   . Hypertension Daughter   . Fibromyalgia Daughter   . GER disease Daughter   . Fibromyalgia Daughter    . Crohn's disease Daughter   . Asthma Daughter     SOCIAL HISTORY: Social History   Social History  . Marital status: Widowed    Spouse name: N/A  . Number of children: N/A  . Years of education: N/A   Occupational History  . Not on file.   Social History Main Topics  . Smoking status: Never Smoker  . Smokeless tobacco: Never Used  . Alcohol use No  . Drug use: No  . Sexual activity: Not on file   Other Topics Concern  . Not on file   Social History Narrative  . No narrative on file    REVIEW OF SYSTEMS: Constitutional: No fevers, chills, or sweats, no generalized fatigue, change in appetite Eyes: No visual changes, double vision,  eye pain Ear, nose and throat: No hearing loss, ear pain, nasal congestion, sore throat Cardiovascular: No chest pain, palpitations Respiratory:  No shortness of breath at rest or with exertion, wheezes GastrointestinaI: No nausea, vomiting, diarrhea, abdominal pain, fecal incontinence Genitourinary:  No dysuria, urinary retention or frequency Musculoskeletal:  No neck pain, back pain Integumentary: No rash, pruritus, skin lesions Neurological: as above Psychiatric: No depression, insomnia, anxiety Endocrine: No palpitations, fatigue, diaphoresis, mood swings, change in appetite, change in weight, increased thirst Hematologic/Lymphatic:  No anemia, purpura, petechiae. Allergic/Immunologic: no itchy/runny eyes, nasal congestion, recent allergic reactions, rashes  PHYSICAL EXAM: Vitals:   02/08/17 1407  BP: 124/72  Pulse: 64  Resp: 20   General: No acute distress Head:  Normocephalic/atraumatic Eyes: Fundoscopic exam shows bilateral sharp discs, no vessel changes, exudates, or hemorrhages Neck: supple, no paraspinal tenderness, full range of motion Back: No paraspinal tenderness Heart: regular rate and rhythm Lungs: Clear to auscultation bilaterally. Vascular: No carotid bruits. Skin/Extremities: No rash, no edema Neurological  Exam: Mental status: alert and oriented to person, place, and time, no dysarthria or aphasia, Fund of knowledge is appropriate.  Recent and remote memory are intact.  Attention and concentration are normal.    Able to name objects and repeat phrases.  Montreal Cognitive Assessment  02/08/2017  Visuospatial/ Executive (0/5) 3  Naming (0/3) 1  Attention: Read list of digits (0/2) 2  Attention: Read list of letters (0/1) 0  Attention: Serial 7 subtraction starting at 100 (0/3) 3  Language: Repeat phrase (0/2) 2  Language : Fluency (0/1) 0  Abstraction (0/2) 2  Delayed Recall (0/5) 3  Orientation (0/6) 6  Total 22   Cranial nerves: CN I: not tested CN II: pupils equal, round and reactive to light, visual fields intact, fundi unremarkable. CN III, IV, VI:  full range of motion, no nystagmus, mild left ptosis (chronic per patient) CN V: facial sensation intact CN VII: upper and lower face symmetric CN VIII: hearing intact to finger rub CN IX, X: gag intact, uvula midline CN XI: sternocleidomastoid and trapezius muscles intact CN XII: tongue midline Bulk & Tone: normal, no fasciculations. Motor: 5/5 throughout with no pronator drift. Sensation: intact to all modalities on both UE, decreased cold on right LE, decreased pin on left LE, decreased vibration to ankles bilaterally.No extinction to double simultaneous stimulation.  Romberg test negative Deep Tendon Reflexes: +2 both UE, left patella, +1 right patella and both ankle jerks, no ankle clonus Plantar responses: downgoing bilaterally Cerebellar: no incoordination on finger to nose, heel to shin. No dysdiadochokinesia Gait: unsteady due to right leg pain using cane, no ataxia Tremor: none  IMPRESSION: This is a 82 year old right-handed woman with a history of hypertension, diabetes, rheumatoid arthritis, breast cancer, presenting for evaluation of chronic migraines and confusion. She has 20-25 migraine days a month and has tried  multiple migraine preventative medications, currently on Propranolol, as well as gabapentin and Cymbalta for other conditions. She will be scheduled for Botox with Dr. Tomi Likens. Her daughter has also been reporting episodic confusion, that on further questioning appears to be forgetfulness with conversations. Her MOCA score today is 22/30, indicating mild cognitive impairment, however history suggestive of mild dementia. MRI brain without contrast will be ordered to assess for underlying structural abnormality. She will start Aricept 5mg  daily for a month, then increase to 10mg  daily. Side effects and expectations from the medications were discussed. She also reports episodes of positional vertigo, and was advised to speak  to her physical therapist about doing vestibular therapy as well. She was advised to use her walker at all times. We discussed home safety, and the importance of control of vascular risk factors, physical exercise, and brain stimulation exercises for brain health. She will follow-up in 6 months and knows to call for any changes.   Thank you for allowing me to participate in the care of this patient. Please do not hesitate to call for any questions or concerns.   Ellouise Newer, M.D.  CC: Alma Friendly, NP

## 2017-02-08 NOTE — Patient Instructions (Addendum)
1. Schedule MRI brain without contrast  We have sent a referral to [facility] for your MRI and they will call you directly to schedule your appt. They are located at [place]. If you need to contact them directly please call [phone].   2. Start Aricept 10mg : Take 1/2 tablet daily for a month, then increase to 1 tablet daily 3. Ask your physical therapist to do vestibular therapist for vertigo 4. Use walker at all times 5. Schedule Botox injection with Dr. Tomi Likens for your headaches 6. Follow-up in 6 months, call for any changes  FALL PRECAUTIONS: Be cautious when walking. Scan the area for obstacles that may increase the risk of trips and falls. When getting up in the mornings, sit up at the edge of the bed for a few minutes before getting out of bed. Consider elevating the bed at the head end to avoid drop of blood pressure when getting up. Walk always in a well-lit room (use night lights in the walls). Avoid area rugs or power cords from appliances in the middle of the walkways. Use a walker or a cane if necessary and consider physical therapy for balance exercise. Get your eyesight checked regularly.  FINANCIAL OVERSIGHT: Supervision, especially oversight when making financial decisions or transactions is also recommended.  HOME SAFETY: Consider the safety of the kitchen when operating appliances like stoves, microwave oven, and blender. Consider having supervision and share cooking responsibilities until no longer able to participate in those. Accidents with firearms and other hazards in the house should be identified and addressed as well.  DRIVING: Regarding driving, in patients with progressive memory problems, driving will be impaired. We advise to have someone else do the driving if trouble finding directions or if minor accidents are reported. Independent driving assessment is available to determine safety of driving.  ABILITY TO BE LEFT ALONE: If patient is unable to  contact 911 operator, consider using LifeLine, or when the need is there, arrange for someone to stay with patients. Smoking is a fire hazard, consider supervision or cessation. Risk of wandering should be assessed by caregiver and if detected at any point, supervision and safe proof recommendations should be instituted.  MEDICATION SUPERVISION: Inability to self-administer medication needs to be constantly addressed. Implement a mechanism to ensure safe administration of the medications.  RECOMMENDATIONS FOR ALL PATIENTS WITH MEMORY PROBLEMS: 1. Continue to exercise (Recommend 30 minutes of walking everyday, or 3 hours every week) 2. Increase social interactions - continue going to [employer] and enjoy social gatherings with friends and family 3. Eat healthy, avoid fried foods and eat more fruits and vegetables 4. Maintain adequate blood pressure, blood sugar, and blood cholesterol level. Reducing the risk of stroke and cardiovascular disease also helps promoting better memory. 5. Avoid stressful situations. Live a simple life and avoid aggravations. Organize your time and prepare for the next day in anticipation. 6. Sleep well, avoid any interruptions of sleep and avoid any distractions in the bedroom that may interfere with adequate sleep quality 7. Avoid sugar, avoid sweets as there is a strong link between excessive sugar intake, diabetes, and cognitive impairment We discussed the Mediterranean diet, which has been shown to help patients reduce the risk of progressive memory disorders and reduces cardiovascular risk. This includes eating fish, eat fruits and green leafy vegetables, nuts like almonds and hazelnuts, walnuts, and also use olive oil. Avoid fast foods and fried foods as much as possible. Avoid sweets and sugar as sugar use has  been linked to worsening of memory function.  There is always a concern of gradual progression of memory problems. If this is the case, then we may need to adjust  level of care according to patient needs. Support, both to the patient and caregiver, should then be put into place.

## 2017-02-09 DIAGNOSIS — C44319 Basal cell carcinoma of skin of other parts of face: Secondary | ICD-10-CM | POA: Diagnosis not present

## 2017-02-09 DIAGNOSIS — H10413 Chronic giant papillary conjunctivitis, bilateral: Secondary | ICD-10-CM | POA: Diagnosis not present

## 2017-02-16 DIAGNOSIS — M069 Rheumatoid arthritis, unspecified: Secondary | ICD-10-CM | POA: Diagnosis not present

## 2017-02-16 DIAGNOSIS — I10 Essential (primary) hypertension: Secondary | ICD-10-CM | POA: Diagnosis not present

## 2017-02-16 DIAGNOSIS — G8929 Other chronic pain: Secondary | ICD-10-CM | POA: Diagnosis not present

## 2017-02-16 DIAGNOSIS — R296 Repeated falls: Secondary | ICD-10-CM | POA: Diagnosis not present

## 2017-02-16 DIAGNOSIS — E119 Type 2 diabetes mellitus without complications: Secondary | ICD-10-CM | POA: Diagnosis not present

## 2017-02-16 DIAGNOSIS — C50919 Malignant neoplasm of unspecified site of unspecified female breast: Secondary | ICD-10-CM | POA: Diagnosis not present

## 2017-02-16 DIAGNOSIS — M5440 Lumbago with sciatica, unspecified side: Secondary | ICD-10-CM | POA: Diagnosis not present

## 2017-02-16 DIAGNOSIS — D649 Anemia, unspecified: Secondary | ICD-10-CM | POA: Diagnosis not present

## 2017-02-16 DIAGNOSIS — N3000 Acute cystitis without hematuria: Secondary | ICD-10-CM | POA: Diagnosis not present

## 2017-03-01 ENCOUNTER — Telehealth: Payer: Self-pay | Admitting: Primary Care

## 2017-03-01 NOTE — Telephone Encounter (Signed)
Patient is complaining of fatigue. Patient is also having urgency and feels like she is not emptying completely. Patient states she has been treated several months ago for UTI and feels she that she has it again. Patient relies on her daughter for transportation and is requesting and antibiotic be called to the pharmacy.  Patient is aware that she may not be able to get Rx called in without an appointment, but I will forward her message to her provider. Patient will contact her daughter for pharmacy location if Rx called in and will expect a call back with provider decision. Patient contact number in previous message.

## 2017-03-01 NOTE — Telephone Encounter (Signed)
Copied from [place] [id number]. Topic: Quick Communication - See Telephone Encounter >> Mar 01, 2017 10:19 AM Arletha Grippe wrote: CRM for notification. See Telephone encounter for:  03/01/17. Pt would like medication called in for bladder infection, she states that this has been done before.  [phone], number for patient.

## 2017-03-01 NOTE — Telephone Encounter (Signed)
Attempted to contact pt; unable to leave vm. Pt needing to schedule OV for abx

## 2017-03-01 NOTE — Telephone Encounter (Signed)
Notes   Shannon Higgins 03/01/2017 10:46 AM  Summary: # FOR PHARMACY TO SEND MEDS    PT LEFT NUMBER FOR THE PHARMACY ([facility] [address] IN [place]) [phone]

## 2017-03-01 NOTE — Telephone Encounter (Signed)
I spoke with Shannon Higgins  (DPR signed) and she will talk with pt and cb for appt on 03/02/17.

## 2017-03-04 ENCOUNTER — Ambulatory Visit
Admission: RE | Admit: 2017-03-04 | Discharge: 2017-03-04 | Disposition: A | Discharge status: Home / Self Care | Payer: Medicare HMO | Source: Ambulatory Visit | Attending: Neurology | Admitting: Neurology

## 2017-03-04 DIAGNOSIS — G3184 Mild cognitive impairment, so stated: Secondary | ICD-10-CM

## 2017-03-04 DIAGNOSIS — IMO0002 Reserved for concepts with insufficient information to code with codable children: Secondary | ICD-10-CM

## 2017-03-04 DIAGNOSIS — G43709 Chronic migraine without aura, not intractable, without status migrainosus: Secondary | ICD-10-CM

## 2017-03-04 DIAGNOSIS — S0990XA Unspecified injury of head, initial encounter: Secondary | ICD-10-CM | POA: Diagnosis not present

## 2017-03-04 DIAGNOSIS — R42 Dizziness and giddiness: Secondary | ICD-10-CM

## 2017-03-05 ENCOUNTER — Telehealth: Payer: Self-pay | Admitting: Neurology

## 2017-03-05 NOTE — Telephone Encounter (Signed)
Attempted to call patient/daughter Shannon Higgins on number listed. Mailbox full and unable to leave message. Her MRI brain had showed a very acute/subacute stroke. This would not be the cause of her memory issues, stroke likely occurred within the past week. Would start a daily aspirin and do stroke workup as outpatient. Will attempt to call again

## 2017-03-06 NOTE — Telephone Encounter (Signed)
Discussed MRI findings with patient and daughter, showing very small stroke in the left temporal region. It showed extensive white matter disease. Instructed to start taking enteric coated aspirin 81mg  daily, continue statin, control of blood pressure, cholesterol, glucose levels. Instructed to go to ER immediately for any change in symptoms. She had an echo in Feb, needs carotid ultrasound as part of stroke workup.  [place], pls schedule carotid ultrasound. They are also asking about Botox approval. Thanks!

## 2017-03-06 NOTE — Telephone Encounter (Signed)
Pls call and see what time I can speak to her about MRI findings, thanks

## 2017-03-07 ENCOUNTER — Other Ambulatory Visit: Payer: Self-pay

## 2017-03-07 DIAGNOSIS — I639 Cerebral infarction, unspecified: Secondary | ICD-10-CM

## 2017-03-07 NOTE — Telephone Encounter (Signed)
Orders placed for bilateral U/S with [facility].    Pt has appointment with Dr. Tomi Likens for Botox injection on March 17, 2017. Mikey Bussing, CMA looking into approval.

## 2017-03-14 DIAGNOSIS — H401133 Primary open-angle glaucoma, bilateral, severe stage: Secondary | ICD-10-CM | POA: Diagnosis not present

## 2017-03-14 NOTE — Progress Notes (Addendum)
Called [facility], spoke with Deneise Lever re: Botox PA submitted online 03/09/17. Has been apprvd, unlimited visits valid 11/8/1/-03/09/2019 PA# [id number] Call ref# [id number] buy and bill  Pts daugher, [name] called to inquire if Botox was apprvd for 03/17/17, called and advsd her botox is apprvd.

## 2017-03-17 ENCOUNTER — Ambulatory Visit (INDEPENDENT_AMBULATORY_CARE_PROVIDER_SITE_OTHER): Payer: Medicare HMO | Admitting: Neurology

## 2017-03-17 DIAGNOSIS — IMO0002 Reserved for concepts with insufficient information to code with codable children: Secondary | ICD-10-CM

## 2017-03-17 DIAGNOSIS — G43709 Chronic migraine without aura, not intractable, without status migrainosus: Secondary | ICD-10-CM

## 2017-03-17 MED ORDER — ONABOTULINUMTOXINA 100 UNITS IJ SOLR
100.0000 [IU] | Freq: Once | INTRAMUSCULAR | Status: AC
  Administered 2017-03-17: 100 [IU] via INTRAMUSCULAR

## 2017-03-17 NOTE — Progress Notes (Signed)
[facility]  ° °Procedure Note Botox ° °Attending: Dr. Dustan Hyams ° °Preoperative Diagnosis(es): Chronic migraine ° °Consent obtained from: The patient °Benefits discussed included, but were not limited to decreased muscle tightness, increased joint range of motion, and decreased pain.  Risk discussed included, but were not limited pain and discomfort, bleeding, bruising, excessive weakness, venous thrombosis, muscle atrophy and dysphagia.  Anticipated outcomes of the procedure as well as he risks and benefits of the alternatives to the procedure, and the roles and tasks of the personnel to be involved, were discussed with the patient, and the patient consents to the procedure and agrees to proceed. A copy of the patient medication guide was given to the patient which explains the blackbox warning. ° °Patients identity and treatment sites confirmed Yes.  . ° °Details of Procedure: °Skin was cleaned with alcohol. Prior to injection, the needle plunger was aspirated to make sure the needle was not within a blood vessel.  There was no blood retrieved on aspiration.   ° °Following is a summary of the muscles injected  And the amount of Botulinum toxin used: ° °Dilution °200 units of Botox was reconstituted with 4 ml of preservative free normal saline. °Time of reconstitution: At the time of the office visit (<30 minutes prior to injection)  ° °Injections  °155 total units of Botox was injected with a 30 gauge needle. ° °Injection Sites: °L occipitalis: 15 units- 3 sites  °R occiptalis: 15 units- 3 sites ° °L upper trapezius: 15 units- 3 sites °R upper trapezius: 15 units- 3 sits          °L paraspinal: 10 units- 2 sites °R paraspinal: 10 units- 2 sites ° °Face °L frontalis(2 injection sites):10 units   °R frontalis(2 injection sites):10 units         °L corrugator: 5 units   °R corrugator: 5 units           °Procerus: 5 units   °L temporalis: 20 units °R temporalis: 20 units  ° °Agent:  °200 units of botulinum Type  A (Onobotulinum Toxin type A) was reconstituted with 4 ml of preservative free normal saline.  °Time of reconstitution: At the time of the office visit (<30 minutes prior to injection)  ° ° ° Total injected (Units):  155 ° Total wasted (Units):  45 ° °Patient tolerated procedure well without complications.   °Reinjection is anticipated in 3 months. ° ° °

## 2017-03-20 ENCOUNTER — Encounter: Payer: Self-pay | Admitting: Oncology

## 2017-03-27 ENCOUNTER — Ambulatory Visit: Payer: Self-pay

## 2017-03-27 NOTE — Telephone Encounter (Signed)
Daughter called with report of pt. having sore throat. laryngitis, and cough since  Friday. Stated the pt. usually has a chronic sinusitis, and it settles into her chest.  Daughter stated she is a Marine scientist, and will listen to pt's lungs when she gets home today.  Reported her husband is with pt. At this time.  Denied any nasal congestion at this time.  Denies fever.  Denies resp. distress. Reported she is coughing up thick, brown phlegm with dark blood tinge at intervals.  Reported she is taking Nyquil Cold and Flu, and Ibuprofen for body aches.  Has an appt. 11/27 with NP @ [facility] [place].  Advised daughter to continue home care for current sx's, until appt. Tomorrow.  Encouraged to take the pt. to UC or the ED tonight, if her symptoms worsen.   Verb. Understanding.     Reason for Disposition . Coughing up rusty-colored (reddish-brown) sputum  Answer Assessment - Initial Assessment Questions 1. ONSET: "When did the nasal discharge start?"     no 2. AMOUNT: "How much discharge is there?"      n/a 3. COUGH: "Do you have a cough?" If yes, ask: "Describe the color of your sputum" (clear, white, yellow, green)     Frequent cough; coughing up thick brown, dark blood    4. RESPIRATORY DISTRESS: "Describe your breathing."      No breathing difficulty 5. FEVER: "Do you have a fever?" If so, ask: "What is your temperature, how was it measured, and when did it start?"     no 6. SEVERITY: "Overall, how bad are you feeling right now?" (e.g., doesn't interfere with normal activities, staying home from school/work, staying in bed)      Sleeping, laying around more than usual  7. OTHER SYMPTOMS: "Do you have any other symptoms?" (e.g., sore throat, earache, wheezing, vomiting)     No wheezing; c/o sore throat, no earache; no N/V.   8. PREGNANCY: "Is there any chance you are pregnant?" "When was your last menstrual period?"     no  Answer Assessment - Initial Assessment Questions 1. ONSET: "When did the cough  begin?"      Thursday, Friday 2. SEVERITY: "How bad is the cough today?"      Frequent;3. RESPIRATORY DISTRESS: "Describe your breathing."      No resp. distress 4. FEVER: "Do you have a fever?" If so, ask: "What is your temperature, how was it measured, and when did it start?"     No fever 5. SPUTUM: "Describe the color of your sputum" (clear, white, yellow, green)      coughing thick,brown with dk. blood tinged mucus   6. HEMOPTYSIS: "Are you coughing up any blood?" If so ask: "How much?" (flecks, streaks, tablespoons, etc.)     Described dark blood tinge to the phlegm 7. CARDIAC HISTORY: "Do you have any history of heart disease?" (e.g., heart attack, congestive heart failure)      *No Answer* 8. LUNG HISTORY: "Do you have any history of lung disease?"  (e.g., pulmonary embolus, asthma, emphysema)     *No Answer* 9. PE RISK FACTORS: "Do you have a history of blood clots?" (or: recent major surgery, recent prolonged travel, bedridden )     *No Answer* 10. OTHER SYMPTOMS: "Do you have any other symptoms?" (e.g., runny nose, wheezing, chest pain)       No wheezing; no chest pain 11. PREGNANCY: "Is there any chance you are pregnant?" "When was your last menstrual period?"  no 12. TRAVEL: "Have you traveled out of the country in the last month?" (e.g., travel history, exposures)       no  Protocols used: [place], COMMON COLD-A-AH

## 2017-03-28 ENCOUNTER — Ambulatory Visit: Payer: Medicare HMO | Admitting: Primary Care

## 2017-03-28 ENCOUNTER — Encounter: Payer: Self-pay | Admitting: Primary Care

## 2017-03-28 VITALS — BP 120/70 | HR 75 | Temp 98.8°F | Ht 63.0 in | Wt 174.1 lb

## 2017-03-28 DIAGNOSIS — J069 Acute upper respiratory infection, unspecified: Secondary | ICD-10-CM | POA: Diagnosis not present

## 2017-03-28 MED ORDER — BENZONATATE 200 MG PO CAPS: 200.0000 mg | ORAL_CAPSULE | Freq: Three times a day (TID) | ORAL | 0 refills | Status: DC | PRN

## 2017-03-28 NOTE — Progress Notes (Signed)
Subjective:    Patient ID: Shannon Higgins, female    DOB: Dec 07, 1934, 82 y.o.   MRN: [id number]  HPI  Shannon Higgins is a 82 year old female with a history of GERD, sinusitis, type 2 diabetes, breast cancer who presents today with a chief complaint of sinus . She also reports sore throat, voice hoarseness, chest congestion. Her cough is productive with dark green mucous. Her symptoms began five days ago. She denies fevers. She's been taking Nyquil and Robitussin with some improvement. She's been around her family who have had similar symptoms. Her daughter was treated with steroids and an antibiotic for a "sinus infection" after 3 days of symptoms.   Review of Systems  Constitutional: Negative for fever.  HENT: Positive for congestion and voice change. Negative for ear pain, sinus pressure and sore throat.   Respiratory: Positive for cough. Negative for shortness of breath and wheezing.   Cardiovascular: Negative for chest pain.       Past Medical History:  Diagnosis Date  . Asthma   . Breast cancer ([place])   . Chronic sinusitis   . Diabetes mellitus without complication ([place])   . GERD (gastroesophageal reflux disease)   . History of recurrent UTIs   . Hypertension   . Insomnia   . Migraine   . Neuropathic pain   . Pneumonia   . Rheumatoid arthritis ([place])   . Sleep apnea   . Stroke ([place])   . Thyroid disease      Social History   Socioeconomic History  . Marital status: Widowed    Spouse name: Not on file  . Number of children: Not on file  . Years of education: Not on file  . Highest education level: Not on file  Social Needs  . Financial resource strain: Not on file  . Food insecurity - worry: Not on file  . Food insecurity - inability: Not on file  . Transportation needs - medical: Not on file  . Transportation needs - non-medical: Not on file  Occupational History  . Not on file  Tobacco Use  . Smoking status: Never Smoker  . Smokeless tobacco: Never Used    Substance and Sexual Activity  . Alcohol use: No  . Drug use: No  . Sexual activity: Not on file  Other Topics Concern  . Not on file  Social History Narrative   Pt lives in 1 story home with her daughter, [name] and [name]'s husband   Has 2 adult daughters   Highest level of education: some college   Worked mainly as Web designer.    Past Surgical History:  Procedure Laterality Date  . ABDOMINAL HYSTERECTOMY    . BACK SURGERY    . BREAST LUMPECTOMY    . CHOLECYSTECTOMY    . KNEE SURGERY    . SHOULDER SURGERY      Family History  Problem Relation Age of Onset  . Diabetes Daughter   . Hypertension Daughter   . Fibromyalgia Daughter   . GER disease Daughter   . Fibromyalgia Daughter   . Crohn's disease Daughter   . Asthma Daughter   . Hypertension Mother     No Known Allergies  Current Outpatient Medications on File Prior to Visit  Medication Sig Dispense Refill  . anastrozole (ARIMIDEX) 1 MG tablet Take 1 mg by mouth daily.    . brimonidine (ALPHAGAN) 0.2 % ophthalmic solution Place 1 drop into both eyes 3 (three) times daily.    [name]  cetirizine (ZYRTEC) 10 MG chewable tablet Chew 10 mg by mouth daily.    [name] donepezil (ARICEPT) 10 MG tablet Take 1/2 tablet daily for a month, then increase to 1 tablet daily 30 tablet 11  . DULoxetine (CYMBALTA) 60 MG capsule Take 60 mg by mouth daily.    . fluticasone (FLONASE) 50 MCG/ACT nasal spray Place 2 sprays into both nostrils daily. 48 g 3  . gabapentin (NEURONTIN) 600 MG tablet Take 600 mg by mouth 4 (four) times daily.    [name] guaiFENesin (MUCINEX) 600 MG 12 hr tablet Take 1 tablet (600 mg total) by mouth 2 (two) times daily. 15 tablet 0  . latanoprost (XALATAN) 0.005 % ophthalmic solution Place 1 drop into both eyes at bedtime.    [name] levothyroxine (SYNTHROID) 150 MCG tablet Take 1 tablet by mouth every morning on an empty stomach with a full glass of water. 90 tablet 0  . meclizine (ANTIVERT) 25 MG tablet Take 1 tablet (25 mg  total) by mouth 2 (two) times daily as needed for dizziness. 180 tablet 0  . meloxicam (MOBIC) 15 MG tablet Take 15 mg by mouth daily.    . metFORMIN (GLUCOPHAGE) 500 MG tablet Take 500 mg by mouth 2 (two) times daily with a meal.    . omeprazole (PRILOSEC) 20 MG capsule Take 20 mg by mouth 2 (two) times daily.    . pravastatin (PRAVACHOL) 40 MG tablet Take 40 mg by mouth at bedtime.    . propranolol (INDERAL) 80 MG tablet Take 80 mg by mouth daily.    [name] amoxicillin (AMOXIL) 500 MG capsule Take 4 capsules by mouth one hour prior to appiontment     No current facility-administered medications on file prior to visit.     BP 120/70   Pulse 75   Temp 98.8 F (37.1 C) (Oral)   Ht 5\' 3"  (1.6 m)   Wt 174 lb 1.9 oz (79 kg)   SpO2 98%   BMI 30.84 kg/m    Objective:   Physical Exam  Constitutional: She appears well-nourished. She does not appear ill.  HENT:  Right Ear: Tympanic membrane and ear canal normal.  Left Ear: Tympanic membrane and ear canal normal.  Nose: Mucosal edema present. Right sinus exhibits no maxillary sinus tenderness and no frontal sinus tenderness. Left sinus exhibits no maxillary sinus tenderness and no frontal sinus tenderness.  Mouth/Throat: Oropharynx is clear and moist.  Hoarse voice  Eyes: Conjunctivae are normal.  Neck: Neck supple.  Cardiovascular: Normal rate and regular rhythm.  Pulmonary/Chest: Effort normal and breath sounds normal. She has no wheezes. She has no rales.  Lymphadenopathy:    She has no cervical adenopathy.  Skin: Skin is warm and dry.          Assessment & Plan:  URI:  Cough, congestion x 5 days. Around other family members with similar symptoms. Exam today with clear lungs, normal vitals, dry cough. Doesn't appear sickly. Suspect viral URI and will treat with supportive measures. Rx for Ladona Ridgel sent to pharmacy.  Discussed return precautions. She will update.  Sheral Flow, NP

## 2017-03-28 NOTE — Patient Instructions (Signed)
Your symptoms are representative of a viral illness which will resolve on its own over time. Our goal is to treat your symptoms in order to aid your body in the healing process and to make you more comfortable.   You may take Benzonatate capsules for cough. Take 1 capsule by mouth three times daily as needed for cough.  Please notify me if you develop persistent fevers of 101, contiue coughing up green mucous, notice increased fatigue or weakness, or feel worse after 1 week of onset of symptoms.   Increase consumption of water intake and rest.  It was a pleasure to see you today!   Upper Respiratory Infection, Adult Most upper respiratory infections (URIs) are caused by a virus. A URI affects the nose, throat, and upper air passages. The most common type of URI is often called "the common cold." Follow these instructions at home:  Take medicines only as told by your doctor.  Gargle warm saltwater or take cough drops to comfort your throat as told by your doctor.  Use a warm mist humidifier or inhale steam from a shower to increase air moisture. This may make it easier to breathe.  Drink enough fluid to keep your pee (urine) clear or pale yellow.  Eat soups and other clear broths.  Have a healthy diet.  Rest as needed.  Go back to work when your fever is gone or your doctor says it is okay. ? You may need to stay home longer to avoid giving your URI to others. ? You can also wear a face mask and wash your hands often to prevent spread of the virus.  Use your inhaler more if you have asthma.  Do not use any tobacco products, including cigarettes, chewing tobacco, or electronic cigarettes. If you need help quitting, ask your doctor. Contact a doctor if:  You are getting worse, not better.  Your symptoms are not helped by medicine.  You have chills.  You are getting more short of breath.  You have brown or red mucus.  You have yellow or brown discharge from your  nose.  You have pain in your face, especially when you bend forward.  You have a fever.  You have puffy (swollen) neck glands.  You have pain while swallowing.  You have white areas in the back of your throat. Get help right away if:  You have very bad or constant: ? Headache. ? Ear pain. ? Pain in your forehead, behind your eyes, and over your cheekbones (sinus pain). ? Chest pain.  You have long-lasting (chronic) lung disease and any of the following: ? Wheezing. ? Long-lasting cough. ? Coughing up blood. ? A change in your usual mucus.  You have a stiff neck.  You have changes in your: ? Vision. ? Hearing. ? Thinking. ? Mood. This information is not intended to replace advice given to you by your health care provider. Make sure you discuss any questions you have with your health care provider. Document Released: 10/05/2007 Document Revised: 12/20/2015 Document Reviewed: 07/24/2013 Elsevier Interactive Patient Education  2018 [employer].

## 2017-03-29 ENCOUNTER — Ambulatory Visit: Payer: Self-pay | Admitting: *Deleted

## 2017-03-29 ENCOUNTER — Other Ambulatory Visit: Payer: Self-pay | Admitting: Primary Care

## 2017-03-29 DIAGNOSIS — J069 Acute upper respiratory infection, unspecified: Secondary | ICD-10-CM

## 2017-03-29 MED ORDER — GUAIFENESIN-CODEINE 100-10 MG/5ML PO SYRP: 5.0000 mL | ORAL_SOLUTION | Freq: Every evening | ORAL | 0 refills | Status: DC | PRN

## 2017-03-29 MED ORDER — AZITHROMYCIN 250 MG PO TABS: ORAL_TABLET | ORAL | 0 refills | Status: AC

## 2017-03-29 NOTE — Telephone Encounter (Signed)
  Reason for Disposition . [1] Nasal discharge AND [2] present > 10 days  Protocols used: COUGH - ACUTE PRODUCTIVE-A-AH  Pt daughter is  On  Telephone   Stating  Pt  Was  Seen  By  [place]  Was  Prescribed  Tessalon pearles . Daughter  States  Cough  Is   Worse  And  Know it is  Productive . She was  Advised  To  Call  The  Office  If  Not  Better . Pts  Daughter   Says  She  Is  A  Nurse  And  Wants  To  Speak to  someone  In office.   daughter  Advised  If  Symptoms  Severe  Or  Worse  To call   911  I  Spoke  With

## 2017-03-29 NOTE — Telephone Encounter (Signed)
  Reason for Disposition . Cough  Answer Assessment - Initial Assessment Questions 1. ONSET: "When did the cough begin?"       5  Days   Ago    2. SEVERITY: "How bad is the cough today?"        Worse  3. RESPIRATORY DISTRESS: "Describe your breathing."        Pt   Denies  Being   Daughter  Reports the   Shortness  Of  Breath is  Mild   4. FEVER: "Do you have a fever?" If so, ask: "What is your temperature, how was it measured, and when did it start?"     No 5. SPUTUM: "Describe the color of your sputum" (clear, white, yellow, green)      Thick  And  Dark  Green   6. HEMOPTYSIS: "Are you coughing up any blood?" If so ask: "How much?" (flecks, streaks, tablespoons, etc.)      No  7. CARDIAC HISTORY: "Do you have any history of heart disease?" (e.g., heart attack, congestive heart failure)       Htn  AND ELEVATED  CHOLESTEROL  8. LUNG HISTORY: "Do you have any history of lung disease?"  (e.g., pulmonary embolus, asthma, emphysema)      Sinus  Issues       Allergies  9. PE RISK FACTORS: "Do you have a history of blood clots?" (or: recent major surgery, recent prolonged travel, bedridden )     None 10. OTHER SYMPTOMS: "Do you have any other symptoms?" (e.g., runny nose, wheezing, chest pain)       Slight  Wheezing  2  Days   Ago  -  NOT  SLEEPING  WELL   11. PREGNANCY: "Is there any chance you are pregnant?" "When was your last menstrual period?"       NO 12. TRAVEL: "Have you traveled out of the country in the last month?" (e.g., travel history, exposures)       NO  Protocols used: [place]

## 2017-03-29 NOTE — Telephone Encounter (Signed)
I   Trion  Who  Will    Followup.

## 2017-03-29 NOTE — Telephone Encounter (Signed)
Spoken and notified patient's daughter of Kate's comments. Patient's daughter stated if patient have something stronger medication for the cough like a cough syrup. If possible for her to pick up today. She is aware Anda Kraft is still seeing patient. Also please send the antibiotic to the pharmacy.

## 2017-03-29 NOTE — Telephone Encounter (Signed)
Noted, Rx for cough medication printed. Antibiotic sent to pharmacy.

## 2017-03-29 NOTE — Telephone Encounter (Addendum)
I still believe symptoms are viral in nature, antibiotics don't treat viruses.  I see in the documentation within this note that symptoms have been present for over 10 days, the patient told me 5 days yesterday. She also had a normal exam. Will send an antibiotic for her to fill in 24 hours if no improvement. Please notify patient that if the antibiotic doesn't help then it's very likely she has a viral infection.  Please confirm that she understands then I'll send in the prescription.

## 2017-04-17 ENCOUNTER — Telehealth: Payer: Self-pay

## 2017-04-17 DIAGNOSIS — Z853 Personal history of malignant neoplasm of breast: Secondary | ICD-10-CM

## 2017-04-17 DIAGNOSIS — Z1239 Encounter for other screening for malignant neoplasm of breast: Secondary | ICD-10-CM

## 2017-04-17 NOTE — Telephone Encounter (Signed)
I will defer to PCP

## 2017-04-17 NOTE — Telephone Encounter (Signed)
Copied from [place] [id number]. Topic: Referral - Request >> Apr 17, 2017  3:18 PM Arletha Grippe wrote: Reason for CRM:pt needs order for diagnostic mammogram due to past history of breast cancer.  Please send to the breast center. Her [place] mammos have been in [place]. Please [phone]

## 2017-04-18 NOTE — Telephone Encounter (Signed)
Noted, orders placed. 

## 2017-04-18 NOTE — Addendum Note (Signed)
Addended by: Pleas Koch on: 04/18/2017 05:26 PM   Modules accepted: Orders

## 2017-05-04 ENCOUNTER — Ambulatory Visit: Payer: Medicare HMO | Admitting: Oncology

## 2017-05-08 ENCOUNTER — Telehealth: Payer: Self-pay | Admitting: Neurology

## 2017-05-08 NOTE — Telephone Encounter (Signed)
Patient called regarding a headache she has had since 04/21/17. She said nothing is helping. She is due for her next Botox on 06/23/17. She was asking could she have it sooner? Please Call. Thanks

## 2017-05-09 NOTE — Telephone Encounter (Signed)
Returned call.  No answer.  Voicemail box is not set up.  Unable to leave message.

## 2017-05-10 ENCOUNTER — Telehealth: Payer: Self-pay | Admitting: Neurology

## 2017-05-10 NOTE — Telephone Encounter (Signed)
Pt called and has some questions about insurance and the botox CB# [phone]

## 2017-05-11 NOTE — Telephone Encounter (Signed)
Pt wanted to have Botox sooner than 06/23/17, advsd her can only have Q 90 days. Dr Lars Masson CMA is to call her about headache she has had since late December

## 2017-05-11 NOTE — Telephone Encounter (Signed)
Pt called questioning if able to have Botox sooner than 06/23/17, advised her can only have injections Q 90 days. Patient states has had a headache since late December and wants to know what else can be done about that. I advise her Meagen had tried to reach her and was unable to leave a message, I will forward a meesage to have her contact Pt and advised her to be expecting a call back today.

## 2017-05-16 ENCOUNTER — Telehealth: Payer: Self-pay | Admitting: Primary Care

## 2017-05-16 NOTE — Telephone Encounter (Signed)
Copied from [place]. Topic: Referral - Status >> May 16, 2017  3:43 PM Neva Seat wrote: [facility] - called about a referral that has been closed.  Pt wouldn't come to appt. And attempts were made to reach her.  She hasn't called back.  The referral has been closed for now.  If pt wants to come just let them know.

## 2017-05-16 NOTE — Telephone Encounter (Signed)
Noted  

## 2017-05-22 ENCOUNTER — Encounter: Payer: Self-pay | Admitting: Primary Care

## 2017-05-22 NOTE — Telephone Encounter (Signed)
Hi [name], can you help with the mammogram records?

## 2017-06-07 ENCOUNTER — Telehealth: Payer: Self-pay | Admitting: Primary Care

## 2017-06-07 NOTE — Telephone Encounter (Signed)
Noted. Can we send a letter with this information?

## 2017-06-07 NOTE — Telephone Encounter (Signed)
FYI I spoke with [facility] of [place] concern pt diag mammogram.  They left message on 04/19/17 - 04/27/17 and 05/22/17 .  I also left a message today 06/07/17 asking pt to call the office.

## 2017-06-08 ENCOUNTER — Encounter: Payer: Self-pay | Admitting: Primary Care

## 2017-06-08 NOTE — Telephone Encounter (Signed)
Mailed letter °

## 2017-06-09 ENCOUNTER — Ambulatory Visit: Payer: Medicare HMO | Admitting: Primary Care

## 2017-06-15 ENCOUNTER — Other Ambulatory Visit: Payer: Self-pay | Admitting: Primary Care

## 2017-06-15 DIAGNOSIS — E039 Hypothyroidism, unspecified: Secondary | ICD-10-CM

## 2017-06-15 NOTE — Telephone Encounter (Signed)
Copied from [place]. Topic: Quick Communication - See Telephone Encounter >> Jun 15, 2017 11:00 AM Ahmed Prima L wrote: CRM for notification. See Telephone encounter for:   06/15/17.  [facility] needs refills::  BD single use swabs omeprazole (PRILOSEC) 40 MG capsule propranolol (INDERAL) 80 MG tablet Triamterene/hctz 37.5-25 levothyroxine (SYNTHROID) 150 MCG tablet metFORMIN (GLUCOPHAGE) 500 MG tablet DULoxetine (CYMBALTA) 60 MG capsule

## 2017-06-15 NOTE — Telephone Encounter (Signed)
See above request for multiple medications to be refilled through [facility]: 12/06/16 to establish care  Office note of 12/06/17 stated "BP stable off of triamterene-HCTZ. Continue propranolol 80 mg, moreso for chronic headaches. Daughter will continue to monitor."  Of medications requested the only one that Shannon Higgins has done previous Rx on is Levothyroxine; # 90; No RF's, on 12/15/16  Other meds requested appear to be through historical provider.   Pharmacy: [facility]

## 2017-06-16 ENCOUNTER — Ambulatory Visit: Payer: Medicare HMO | Admitting: Primary Care

## 2017-06-16 NOTE — Telephone Encounter (Signed)
Ok to refill? These medications are have been prescribed by Shannon Higgins. Last seen on 03/28/2017. Scheduled for 06/21/2017

## 2017-06-19 NOTE — Telephone Encounter (Signed)
Looks like she never followed up for repeat TSH as recommended, also has not had refills for levothyroxine since initial Rx prescribed for three month supply (no refills) in August 2018. Patient due for follow up in 2 days (cancelled follow up visit for 06/16/17), will discuss levothyroxine dose and other medication refills at that time.

## 2017-06-21 ENCOUNTER — Ambulatory Visit: Payer: Medicare HMO | Admitting: Primary Care

## 2017-06-21 ENCOUNTER — Encounter: Payer: Self-pay | Admitting: Primary Care

## 2017-06-21 VITALS — BP 124/70 | HR 78 | Temp 97.9°F | Wt 169.8 lb

## 2017-06-21 DIAGNOSIS — F330 Major depressive disorder, recurrent, mild: Secondary | ICD-10-CM | POA: Diagnosis not present

## 2017-06-21 DIAGNOSIS — R410 Disorientation, unspecified: Secondary | ICD-10-CM | POA: Diagnosis not present

## 2017-06-21 DIAGNOSIS — M5440 Lumbago with sciatica, unspecified side: Secondary | ICD-10-CM

## 2017-06-21 DIAGNOSIS — E2839 Other primary ovarian failure: Secondary | ICD-10-CM

## 2017-06-21 DIAGNOSIS — F339 Major depressive disorder, recurrent, unspecified: Secondary | ICD-10-CM | POA: Insufficient documentation

## 2017-06-21 DIAGNOSIS — E785 Hyperlipidemia, unspecified: Secondary | ICD-10-CM

## 2017-06-21 DIAGNOSIS — D649 Anemia, unspecified: Secondary | ICD-10-CM

## 2017-06-21 DIAGNOSIS — E039 Hypothyroidism, unspecified: Secondary | ICD-10-CM

## 2017-06-21 DIAGNOSIS — E1140 Type 2 diabetes mellitus with diabetic neuropathy, unspecified: Secondary | ICD-10-CM | POA: Diagnosis not present

## 2017-06-21 DIAGNOSIS — E118 Type 2 diabetes mellitus with unspecified complications: Secondary | ICD-10-CM | POA: Diagnosis not present

## 2017-06-21 DIAGNOSIS — M792 Neuralgia and neuritis, unspecified: Secondary | ICD-10-CM

## 2017-06-21 DIAGNOSIS — R42 Dizziness and giddiness: Secondary | ICD-10-CM

## 2017-06-21 DIAGNOSIS — R51 Headache: Secondary | ICD-10-CM | POA: Diagnosis not present

## 2017-06-21 DIAGNOSIS — K219 Gastro-esophageal reflux disease without esophagitis: Secondary | ICD-10-CM | POA: Diagnosis not present

## 2017-06-21 DIAGNOSIS — I10 Essential (primary) hypertension: Secondary | ICD-10-CM

## 2017-06-21 DIAGNOSIS — G8929 Other chronic pain: Secondary | ICD-10-CM

## 2017-06-21 DIAGNOSIS — F329 Major depressive disorder, single episode, unspecified: Secondary | ICD-10-CM | POA: Insufficient documentation

## 2017-06-21 DIAGNOSIS — E119 Type 2 diabetes mellitus without complications: Secondary | ICD-10-CM | POA: Diagnosis not present

## 2017-06-21 MED ORDER — PROPRANOLOL HCL 80 MG PO TABS: 80.0000 mg | ORAL_TABLET | Freq: Every day | ORAL | 3 refills | Status: DC

## 2017-06-21 MED ORDER — OMEPRAZOLE 20 MG PO CPDR
20.0000 mg | DELAYED_RELEASE_CAPSULE | Freq: Two times a day (BID) | ORAL | 1 refills | Status: DC
Start: 2017-06-21 — End: 2017-12-26

## 2017-06-21 MED ORDER — MELOXICAM 15 MG PO TABS: ORAL_TABLET | ORAL | 1 refills | Status: DC

## 2017-06-21 MED ORDER — DULOXETINE HCL 60 MG PO CPEP
60.0000 mg | ORAL_CAPSULE | Freq: Every day | ORAL | 3 refills | Status: DC
Start: 2017-06-21 — End: 2017-12-26

## 2017-06-21 MED ORDER — MECLIZINE HCL 25 MG PO TABS: 25.0000 mg | ORAL_TABLET | Freq: Two times a day (BID) | ORAL | 0 refills | Status: DC | PRN

## 2017-06-21 MED ORDER — FLUTICASONE PROPIONATE 50 MCG/ACT NA SUSP: 2.0000 | Freq: Every day | NASAL | 3 refills | Status: DC

## 2017-06-21 NOTE — Assessment & Plan Note (Signed)
Repeat CBC pending. 

## 2017-06-21 NOTE — Assessment & Plan Note (Signed)
Stable in the office today, continue propranolol 80 mg. Refill sent to pharmacy.

## 2017-06-21 NOTE — Assessment & Plan Note (Signed)
Following with neurology, getting Botox injections. Continue propranolol.

## 2017-06-21 NOTE — Progress Notes (Signed)
Subjective:    Patient ID: Shannon Higgins. [name], female    DOB: 1934-12-28, 83 y.o.   MRN: [id number]  HPI  [name] is an 83 year old female who presents today for follow up.  1) Essential Hypertension: Currently managed on propranolol 80 mg once daily, mostly for migraine prevention. She does have a history of hypotension.  BP Readings from Last 3 Encounters:  06/21/17 124/70  03/28/17 120/70  02/08/17 124/72     2) Migraines/Chronic Headaches: Currently managed on propranolol 80 mg once daily. She endorses headaches several times weekly. She is due for her next Botox injection this week. Following with Neurology.   3) Type 2 Diabetes: Currently managed on metformin 500 mg BID. Her last A1C was 7.7 in August 2018. She is compliant to her Metformin and is due for repeat A1C today.   4) Hyperlipidemia: Currently managed on pravastatin 40 mg once daily. Last lipid panel above goal with TC of 331, LDL of 232. She was not taking her pravastatin at that time, is compliant now and is needing refills.   5) Chronic Low Back Pain/Neuropathic Pain: Currently managed on gabapentin 600 mg QID, Meloxicam 15 mg, Cymbalta 60 mg. Overall feels well managed.   6) Confusion/Depression: Currently managed on donepezil 10 mg. Little motivation or desire to do anything. Denies feeling down/depressed, tearfulness. She's currently managed on Cymbalta 60 mg. PHQ 9 score of 12 today. Denies SI/HI.  Review of Systems  Constitutional: Negative for fatigue.  Respiratory: Negative for shortness of breath.   Cardiovascular: Negative for chest pain.  Endocrine: Negative for cold intolerance.  Musculoskeletal:       Chronic back pain  Neurological: Positive for headaches.  Psychiatric/Behavioral:       See HPI       Past Medical History:  Diagnosis Date  . Asthma   . Breast cancer ([place])   . Chronic sinusitis   . Diabetes mellitus without complication ([place])   . GERD (gastroesophageal reflux disease)   .  History of recurrent UTIs   . Hypertension   . Insomnia   . Migraine   . Neuropathic pain   . Pneumonia   . Rheumatoid arthritis ([place])   . Sleep apnea   . Stroke ([place])   . Thyroid disease      Social History   Socioeconomic History  . Marital status: Widowed    Spouse name: Not on file  . Number of children: Not on file  . Years of education: Not on file  . Highest education level: Not on file  Social Needs  . Financial resource strain: Not on file  . Food insecurity - worry: Not on file  . Food insecurity - inability: Not on file  . Transportation needs - medical: Not on file  . Transportation needs - non-medical: Not on file  Occupational History  . Not on file  Tobacco Use  . Smoking status: Never Smoker  . Smokeless tobacco: Never Used  Substance and Sexual Activity  . Alcohol use: No  . Drug use: No  . Sexual activity: Not on file  Other Topics Concern  . Not on file  Social History Narrative   Pt lives in 1 story home with her daughter, Shannon Higgins and [name]'s husband   Has 2 adult daughters   Highest level of education: some college   Worked mainly as Web designer.    Past Surgical History:  Procedure Laterality Date  . ABDOMINAL HYSTERECTOMY    .  BACK SURGERY    . BREAST LUMPECTOMY    . CHOLECYSTECTOMY    . KNEE SURGERY    . SHOULDER SURGERY      Family History  Problem Relation Age of Onset  . Diabetes Daughter   . Hypertension Daughter   . Fibromyalgia Daughter   . GER disease Daughter   . Fibromyalgia Daughter   . Crohn's disease Daughter   . Asthma Daughter   . Hypertension Mother     No Known Allergies  Current Outpatient Medications on File Prior to Visit  Medication Sig Dispense Refill  . amoxicillin (AMOXIL) 500 MG capsule Take 4 capsules by mouth one hour prior to appiontment    . anastrozole (ARIMIDEX) 1 MG tablet Take 1 mg by mouth daily.    . brimonidine (ALPHAGAN) 0.2 % ophthalmic solution Place 1 drop into both eyes 3  (three) times daily.    . cetirizine (ZYRTEC) 10 MG chewable tablet Chew 10 mg by mouth daily.    [name] donepezil (ARICEPT) 10 MG tablet Take 1/2 tablet daily for a month, then increase to 1 tablet daily 30 tablet 11  . gabapentin (NEURONTIN) 600 MG tablet Take 600 mg by mouth 4 (four) times daily.    [name] guaiFENesin (MUCINEX) 600 MG 12 hr tablet Take 1 tablet (600 mg total) by mouth 2 (two) times daily. 15 tablet 0  . latanoprost (XALATAN) 0.005 % ophthalmic solution Place 1 drop into both eyes at bedtime.    [name] levothyroxine (SYNTHROID) 150 MCG tablet Take 1 tablet by mouth every morning on an empty stomach with a full glass of water. 90 tablet 0  . metFORMIN (GLUCOPHAGE) 500 MG tablet Take 500 mg by mouth 2 (two) times daily with a meal.    . pravastatin (PRAVACHOL) 40 MG tablet Take 40 mg by mouth at bedtime.     No current facility-administered medications on file prior to visit.     BP 124/70   Pulse 78   Temp 97.9 F (36.6 C) (Oral)   Wt 169 lb 12 oz (77 kg)   SpO2 95%   BMI 30.07 kg/m    Objective:   Physical Exam  Constitutional: She appears well-nourished.  Neck: Neck supple. No thyromegaly present.  Cardiovascular: Normal rate and regular rhythm.  Pulmonary/Chest: Effort normal and breath sounds normal.  Skin: Skin is warm and dry.  Psychiatric: She has a normal mood and affect.          Assessment & Plan:

## 2017-06-21 NOTE — Assessment & Plan Note (Addendum)
Doing well on Cymbalta and gabapentin, continue same.

## 2017-06-21 NOTE — Assessment & Plan Note (Signed)
Overall doing well. Continue Cymbalta, Meloxicam. BMP pending.

## 2017-06-21 NOTE — Patient Instructions (Addendum)
Stop by the lab prior to leaving today. I will notify you of your results once received.   I'll send refills of levothyroxine, pravastatin, and metformin once I receive your lab results.  Take the levothyroxine medication on an empty stomach with water. Do not eat, drink or take any other medications within 30 minutes afer taking. Do not take omeprazole for 4 hours after.   Please schedule a follow up appointment in 6 months.   It was a pleasure to see you today!

## 2017-06-21 NOTE — Assessment & Plan Note (Signed)
Stable on PPI, continue same. Discussed to avoid taking omeprazole within 4 hours of levothyroxine.

## 2017-06-21 NOTE — Assessment & Plan Note (Signed)
Alert and oriented today. Continue donepezil

## 2017-06-21 NOTE — Assessment & Plan Note (Signed)
Repeat A1C pending today, continue Metformin 500 mg BID for now. Consider dose increase if needed. Will send refills once A1C returns.

## 2017-06-21 NOTE — Assessment & Plan Note (Signed)
Never followed through for repeat TSH as recommended. After further discussion today she's taking her levothyroxine with her other medications including her PPI. Long discussion with instructions on how to take levothyroxine. She and daughter verbalized understanding.   Repeat TSH pending. Consider waiting on dose change as she's not been taking medication appropriately. Will likely need to repeat labs in 4-6 weeks.

## 2017-06-21 NOTE — Assessment & Plan Note (Signed)
PHQ 9 score of 12 today. Long discussion regarding treatment options, recommended therapy rather than another medication. Need to keep Cymbalta as it has been beneficial for neuropathic and back pain.   She kindly declines treatment and will monitor symptoms. Denies SI/HI

## 2017-06-21 NOTE — Assessment & Plan Note (Signed)
Endorses compliance to pravastatin, lipid panel pending. Will refill once lipid panel returns.

## 2017-06-22 LAB — COMPREHENSIVE METABOLIC PANEL
ALBUMIN: 4.3 g/dL (ref 3.5–5.2)
ALT: 21 U/L (ref 0–35)
AST: 25 U/L (ref 0–37)
Alkaline Phosphatase: 98 U/L (ref 39–117)
BUN: 20 mg/dL (ref 6–23)
CO2: 29 mEq/L (ref 19–32)
CREATININE: 1.01 mg/dL (ref 0.40–1.20)
Calcium: 10.2 mg/dL (ref 8.4–10.5)
Chloride: 97 mEq/L (ref 96–112)
GFR: 56.03 mL/min — ABNORMAL LOW (ref >60.00)
GLUCOSE: 128 mg/dL — AB (ref 70–99)
Potassium: 5.2 mEq/L — ABNORMAL HIGH (ref 3.5–5.1)
SODIUM: 135 meq/L (ref 135–145)
Total Bilirubin: 0.3 mg/dL (ref 0.2–1.2)
Total Protein: 7.6 g/dL (ref 6.0–8.3)

## 2017-06-22 LAB — VITAMIN D 25 HYDROXY (VIT D DEFICIENCY, FRACTURES): VITD: 35.31 ng/mL (ref 30.00–100.00)

## 2017-06-22 LAB — CBC
HEMATOCRIT: 42.6 % (ref 36.0–46.0)
Hemoglobin: 13.4 g/dL (ref 12.0–15.0)
MCHC: 31.6 g/dL (ref 30.0–36.0)
MCV: 88.6 fl (ref 78.0–100.0)
PLATELETS: 365 10*3/uL (ref 150.0–400.0)
RBC: 4.81 Mil/uL (ref 3.87–5.11)
RDW: 15.4 % (ref 11.5–15.5)
WBC: 12.7 10*3/uL — ABNORMAL HIGH (ref 4.0–10.5)

## 2017-06-22 LAB — LIPID PANEL
CHOLESTEROL: 199 mg/dL (ref 0–200)
HDL: 49.4 mg/dL (ref >39.00)
NonHDL: 149.98
Total CHOL/HDL Ratio: 4
Triglycerides: 262 mg/dL — ABNORMAL HIGH (ref 0.0–149.0)
VLDL: 52.4 mg/dL — ABNORMAL HIGH (ref 0.0–40.0)

## 2017-06-22 LAB — HEMOGLOBIN A1C: Hgb A1c MFr Bld: 7.6 % — ABNORMAL HIGH (ref 4.6–6.5)

## 2017-06-22 LAB — LDL CHOLESTEROL, DIRECT: Direct LDL: 121 mg/dL

## 2017-06-22 LAB — TSH: TSH: 0.25 u[IU]/mL — ABNORMAL LOW (ref 0.35–4.50)

## 2017-06-22 LAB — VITAMIN B12: Vitamin B-12: 255 pg/mL (ref 211–911)

## 2017-06-23 ENCOUNTER — Ambulatory Visit (INDEPENDENT_AMBULATORY_CARE_PROVIDER_SITE_OTHER): Payer: Medicare HMO | Admitting: Neurology

## 2017-06-23 DIAGNOSIS — G43709 Chronic migraine without aura, not intractable, without status migrainosus: Secondary | ICD-10-CM

## 2017-06-23 MED ORDER — ONABOTULINUMTOXINA 100 UNITS IJ SOLR
155.0000 [IU] | Freq: Once | INTRAMUSCULAR | Status: AC
  Administered 2017-06-23: 155 [IU] via INTRAMUSCULAR

## 2017-06-23 NOTE — Progress Notes (Signed)
[facility]   Procedure Note Botox  Attending: Dr. Metta Clines  Preoperative Diagnosis(es): Chronic migraine  Consent obtained from: The patient Benefits discussed included, but were not limited to decreased muscle tightness, increased joint range of motion, and decreased pain.  Risk discussed included, but were not limited pain and discomfort, bleeding, bruising, excessive weakness, venous thrombosis, muscle atrophy and dysphagia.  Anticipated outcomes of the procedure as well as he risks and benefits of the alternatives to the procedure, and the roles and tasks of the personnel to be involved, were discussed with the patient, and the patient consents to the procedure and agrees to proceed. A copy of the patient medication guide was given to the patient which explains the blackbox warning.  Patients identity and treatment sites confirmed Yes.  .  Details of Procedure: Skin was cleaned with alcohol. Prior to injection, the needle plunger was aspirated to make sure the needle was not within a blood vessel.  There was no blood retrieved on aspiration.    Following is a summary of the muscles injected  And the amount of Botulinum toxin used:  Dilution 200 units of Botox was reconstituted with 4 ml of preservative free normal saline. Time of reconstitution: At the time of the office visit (<30 minutes prior to injection)   Injections  155 total units of Botox was injected with a 30 gauge needle.  Injection Sites: L occipitalis: 15 units- 3 sites  R occiptalis: 15 units- 3 sites  L upper trapezius: 15 units- 3 sites R upper trapezius: 15 units- 3 sits          L paraspinal: 10 units- 2 sites R paraspinal: 10 units- 2 sites  Face L frontalis(2 injection sites):10 units   R frontalis(2 injection sites):10 units         L corrugator: 5 units   R corrugator: 5 units           Procerus: 5 units   L temporalis: 20 units R temporalis: 20 units   Agent:  200 units of botulinum Type  A (Onobotulinum Toxin type A) was reconstituted with 4 ml of preservative free normal saline.  Time of reconstitution: At the time of the office visit (<30 minutes prior to injection)     Total injected (Units): 155  Total wasted (Units): 0  Patient tolerated procedure well without complications.   Reinjection is anticipated in 3 months.

## 2017-06-24 ENCOUNTER — Encounter: Payer: Self-pay | Admitting: Primary Care

## 2017-07-05 ENCOUNTER — Ambulatory Visit: Payer: Medicare HMO | Admitting: Family Medicine

## 2017-07-06 ENCOUNTER — Encounter: Payer: Self-pay | Admitting: Primary Care

## 2017-07-06 DIAGNOSIS — E039 Hypothyroidism, unspecified: Secondary | ICD-10-CM

## 2017-07-06 MED ORDER — LEVOTHYROXINE SODIUM 125 MCG PO TABS: ORAL_TABLET | ORAL | 0 refills | Status: DC

## 2017-07-11 DIAGNOSIS — H401133 Primary open-angle glaucoma, bilateral, severe stage: Secondary | ICD-10-CM | POA: Diagnosis not present

## 2017-07-18 ENCOUNTER — Other Ambulatory Visit: Payer: Self-pay | Admitting: Primary Care

## 2017-07-18 ENCOUNTER — Encounter: Payer: Self-pay | Admitting: Primary Care

## 2017-07-18 DIAGNOSIS — E039 Hypothyroidism, unspecified: Secondary | ICD-10-CM

## 2017-07-18 DIAGNOSIS — I10 Essential (primary) hypertension: Secondary | ICD-10-CM

## 2017-07-20 ENCOUNTER — Telehealth: Payer: Self-pay | Admitting: Primary Care

## 2017-07-20 NOTE — Telephone Encounter (Signed)
Spoke to daughter she wants intermittent fmla to take pt back and forth to appointment and adl's She stated it might be 5 appointments per month last 4 hours at a time Paperwork in kate's in box  For review and signature

## 2017-07-20 NOTE — Telephone Encounter (Signed)
Left message asking Joelene Millin (daughter) to call office regarding fmla paperwork I received

## 2017-07-21 NOTE — Telephone Encounter (Signed)
Completed and placed on Robins desk. 

## 2017-07-25 ENCOUNTER — Telehealth: Payer: Self-pay | Admitting: Primary Care

## 2017-07-25 NOTE — Telephone Encounter (Signed)
Copied from [place] [id number]. Topic: Quick Communication - See Telephone Encounter >> Jul 25, 2017  1:18 PM Cleaster Corin, NT wrote: CRM for notification. See Telephone encounter for: 07/25/17.  Pt. Calling about test strips and lancets pt. Stated she didn't know what brand that her insurance covers she said that her doctor nurse would know which one. Pt. Stated that she has been trying to get this refilled for a month now. Pt. Would just like a call back from nurse

## 2017-07-25 NOTE — Telephone Encounter (Signed)
Paperwork faxed 3/26

## 2017-07-25 NOTE — Telephone Encounter (Signed)
Left message letting Joelene Millin know paperwork has been faxed Copy for pt\ Copy for scan

## 2017-07-26 MED ORDER — BLOOD GLUCOSE METER KIT: PACK | 0 refills | Status: DC

## 2017-07-26 NOTE — Telephone Encounter (Signed)
Spoken to patient's daughter and patient. Patient stated that she would like a new meter, test strip, and lancets.  I have faxed the Rx to [facility] for patient.

## 2017-07-31 ENCOUNTER — Telehealth: Payer: Self-pay | Admitting: Primary Care

## 2017-07-31 NOTE — Telephone Encounter (Signed)
fmla source faxed paperwork needing clarification   On episodes/flare ups  In kate's in box

## 2017-07-31 NOTE — Telephone Encounter (Signed)
Form completed and placed on [name]'s desk. 

## 2017-08-01 ENCOUNTER — Telehealth: Payer: Self-pay | Admitting: Primary Care

## 2017-08-01 DIAGNOSIS — H401133 Primary open-angle glaucoma, bilateral, severe stage: Secondary | ICD-10-CM | POA: Diagnosis not present

## 2017-08-01 NOTE — Telephone Encounter (Signed)
Will you please call patient (send letter if you can't reach) reminding her to get her TSH lab drawn. I sent a my chart message 2 weeks ago and she's not responded.  Thanks!

## 2017-08-01 NOTE — Telephone Encounter (Signed)
Paperwork faxed Copy for scan

## 2017-08-02 ENCOUNTER — Telehealth: Payer: Self-pay | Admitting: Neurology

## 2017-08-02 MED ORDER — ZOLMITRIPTAN 5 MG NA SOLN: 1.0000 | NASAL | 0 refills | Status: DC | PRN

## 2017-08-02 NOTE — Telephone Encounter (Signed)
Patient's daughter called needing to see what can be done for her mom's headaches? She said she's in a lot of pain with them. She said something had been mentioned regarding breakthrough headaches? She is scheduled 09/29/17 for Botox.  Please Call. Thanks

## 2017-08-02 NOTE — Addendum Note (Signed)
Addended by: Lenny Pastel on: 08/02/2017 02:20 PM   Modules accepted: Orders

## 2017-08-02 NOTE — Telephone Encounter (Deleted)
Spoke with pt's daughter.  Let her know that per Dr. Delice Lesch I will leave sample of Zomig at front desk for pick up.  We will see how this works and pt can talk in depth with Dr. Delice Lesch on her next appointment - April 9th.   Sample placed up front.

## 2017-08-02 NOTE — Telephone Encounter (Signed)
Spoke with pt's daughter.  Let her know that per Dr. Delice Lesch I will leave sample of Zomig at front desk for pick up.  We will see how this works and pt can talk in depth with Dr. Delice Lesch on her next appointment - April 9th.   Sample placed up front.

## 2017-08-02 NOTE — Telephone Encounter (Signed)
Noted  

## 2017-08-02 NOTE — Telephone Encounter (Signed)
Spoke with pt's daughter, [name].  She states that pt has had 2 Botox treatments with Dr. Tomi Likens, and are seeing little to no improvement.  (her third treatment is scheduled for May)  Daughter states that during last Botox treatment  Dr. Tomi Likens had mentioned the possibility prescribing something for break through headaches.  Daughter states that pt was on Imitrex at one time and did well.  Inquired about that as well as cocktail. Let daughter know I would send message to both providers and return call with recommendations. Please advise.

## 2017-08-02 NOTE — Telephone Encounter (Signed)
Spoken to patient's daughter. She stated that she noticed that patient is not taking her medications the that she should. Patient's daughter is going to be FMLA and will straight out patient's medications so patient will be taking it correctly. She will call back to schedule the lab appt.

## 2017-08-08 ENCOUNTER — Ambulatory Visit: Payer: Medicare HMO | Admitting: Neurology

## 2017-08-08 ENCOUNTER — Encounter: Payer: Self-pay | Admitting: Neurology

## 2017-08-08 ENCOUNTER — Other Ambulatory Visit: Payer: Self-pay

## 2017-08-08 VITALS — BP 112/60 | HR 77 | Ht 63.5 in | Wt 166.0 lb

## 2017-08-08 DIAGNOSIS — G3184 Mild cognitive impairment, so stated: Secondary | ICD-10-CM | POA: Diagnosis not present

## 2017-08-08 DIAGNOSIS — I639 Cerebral infarction, unspecified: Secondary | ICD-10-CM

## 2017-08-08 DIAGNOSIS — G43709 Chronic migraine without aura, not intractable, without status migrainosus: Secondary | ICD-10-CM | POA: Diagnosis not present

## 2017-08-08 MED ORDER — SUMATRIPTAN SUCCINATE 25 MG PO TABS: ORAL_TABLET | ORAL | 5 refills | Status: DC

## 2017-08-08 MED ORDER — FREMANEZUMAB-VFRM 225 MG/1.5ML ~~LOC~~ SOSY: 1.0000 [IU] | PREFILLED_SYRINGE | SUBCUTANEOUS | 11 refills | Status: DC

## 2017-08-08 NOTE — Patient Instructions (Addendum)
1. Schedule carotid dopplers 2. Let's do one more round of Botox 3. Start daily aspirin 81mg  (coated) 4. Minimize Imitrex 25mg  tablet as rescue for migraine, do not take more than twice a week 5. Continue Aricept 10mg  daily 6. Continue control of blood pressure, cholesterol, sugar levels 7. For any sudden change in symptoms, go to ER immediately

## 2017-08-08 NOTE — Progress Notes (Signed)
NEUROLOGY FOLLOW UP OFFICE NOTE  Shannon Higgins [id number] June 18, 1934  HISTORY OF PRESENT ILLNESS: I had the pleasure of seeing Shannon Higgins in follow-up in the neurology clinic on 08/08/2017.  The patient was last seen 6 months ago for chronic migraines and memory loss. She is again accompanied by her daughter [name] who helps supplement the history today.  Records and images were personally reviewed where available. I personally reviewed MRI brain without contrast done 03/04/17 which showed an acute/subacute cortical infarct involving the left superior temporal gyrus. There was extensive chronic microvascular disease. I had discussed the findings with her daughter on the phone, she has had an echocardiogram with EF 41-32%, grade 2 diastolic dysfunction, moderate pulmonary hypertension, left atrium normal. She has not had carotid dopplers done yet. She has not started the daily aspirin. Since her last visit, she has tried 2 courses of Botox treatment and feels that it seems to be worsening her headaches. She has headaches daily and mostly lies in bed everyday with an ice pack on her head. She continues to report word-finding difficulties. MMSE in October 2018 was 22/30, she was started on Donepezil 84m daily which she is tolerating without side effects. She does not drive.   HPI 02/08/2017: This is a 83year old right-handed woman with a history of hypertension, diabetes, rheumatoid arthritis, breast cancer, presenting for evaluation of chronic headaches and confusion.   1. Headaches Headaches started in her 369s She went for a period of time with no significant headaches for a year, then last June headaches recurred lasting for weeks at a time. She has 20 to 25 headache days a month, with nausea, upset stomach, sometimes diarrhea. She describes a lot of pressure and throbbing. She had a headache that lasted for 2 months, resolving 2 weeks ago, then this weekend headaches started back and have been  ongoing for 4-5 days. She denies any visual obscurations, no photo/phonophobia. She does not usually have dizziness with the headaches, but recently has been so dizzy she had difficulty getting to the bathroom the past week, with described spinning sensation. Meclizine helps some. She has had vertigo in the past. No family history of migraines. She has tried several headache preventative medications, including amitriptyline, Topiramate. She is taking Propranolol 839mdaily with no side effects. She is also on Neurontin for back pain and Cymbalta for mood.   2. Confusion. She reports word-finding difficulties for the past 6 months. Her daughter mostly describes the episodic confusion as forgetting what she had previously told the patient. She has been taking an old meclizine prescription, but forgot that her daughter told her this information. She has not been driving for the past month due to glaucoma, denied previously getting lost driving. After her husband passed away 1.5 years ago, she moved in with her daughter and son-in-law. They are in charge of finances. Her daughter states she forgets her medications. One time she forgot to take her Cymbalta for a week. No paranoia or hallucinations. She used to be a social butterfly but now likes to be more at home. There is a strong family history of dementia in her mother and multiple siblings.   She denies any diplopia, dysarthria/dysphagia, anosmia, or tremors. She has chronic neck and back pain and neuropathy in both feet radiating up her legs. She has a little urinary incontinence, no bowel issues. They report several falls with the dizziness. After she hit her head with fall in July, she had a bad headache for  6 weeks. She was veering to the left and went to the ER on 7/13 where head CT did not show any acute changes. She got a migraine cocktail but headache was still bad the next day.    PAST MEDICAL HISTORY: Past Medical History:  Diagnosis Date  .  Asthma   . Breast cancer ([place])   . Chronic sinusitis   . Diabetes mellitus without complication ([place])   . GERD (gastroesophageal reflux disease)   . History of recurrent UTIs   . Hypertension   . Insomnia   . Migraine   . Neuropathic pain   . Pneumonia   . Rheumatoid arthritis ([place])   . Sleep apnea   . Stroke ([place])   . Thyroid disease     MEDICATIONS: Current Outpatient Medications on File Prior to Visit  Medication Sig Dispense Refill  . amoxicillin (AMOXIL) 500 MG capsule Take 4 capsules by mouth one hour prior to appiontment    . anastrozole (ARIMIDEX) 1 MG tablet Take 1 mg by mouth daily.    . blood glucose meter kit and supplies Dispense based on patient and insurance preference. Use up to 2 times daily as directed. (FOR ICD-10 E10.9, E11.9). 1 each 0  . brimonidine (ALPHAGAN) 0.2 % ophthalmic solution Place 1 drop into both eyes 3 (three) times daily.    . cetirizine (ZYRTEC) 10 MG chewable tablet Chew 10 mg by mouth daily.    [name] donepezil (ARICEPT) 10 MG tablet Take 1/2 tablet daily for a month, then increase to 1 tablet daily 30 tablet 11  . DULoxetine (CYMBALTA) 60 MG capsule Take 1 capsule (60 mg total) by mouth daily. 90 capsule 3  . fluticasone (FLONASE) 50 MCG/ACT nasal spray Place 2 sprays into both nostrils daily. 48 g 3  . gabapentin (NEURONTIN) 600 MG tablet Take 600 mg by mouth 4 (four) times daily.    [name] guaiFENesin (MUCINEX) 600 MG 12 hr tablet Take 1 tablet (600 mg total) by mouth 2 (two) times daily. 15 tablet 0  . latanoprost (XALATAN) 0.005 % ophthalmic solution Place 1 drop into both eyes at bedtime.    [name] levothyroxine (SYNTHROID, LEVOTHROID) 125 MCG tablet Take 1 tablet by mouth on an empty stomach with a full glass of water. No food or medications for 30 minutes. 30 tablet 0  . meclizine (ANTIVERT) 25 MG tablet Take 1 tablet (25 mg total) by mouth 2 (two) times daily as needed for dizziness. 180 tablet 0  . meloxicam (MOBIC) 15 MG tablet Take 1 tablet by  mouth once daily as needed for pain. 90 tablet 1  . metFORMIN (GLUCOPHAGE) 500 MG tablet Take 500 mg by mouth 2 (two) times daily with a meal.    . omeprazole (PRILOSEC) 20 MG capsule Take 1 capsule (20 mg total) by mouth 2 (two) times daily. 180 capsule 1  . pravastatin (PRAVACHOL) 40 MG tablet Take 40 mg by mouth at bedtime.    . propranolol (INDERAL) 80 MG tablet Take 1 tablet (80 mg total) by mouth daily. 90 tablet 3  . zolmitriptan (ZOMIG) 5 MG nasal solution Place 1 spray into the nose as needed for migraine. 6 Units 0   No current facility-administered medications on file prior to visit.     ALLERGIES: No Known Allergies  FAMILY HISTORY: Family History  Problem Relation Age of Onset  . Diabetes Daughter   . Hypertension Daughter   . Fibromyalgia Daughter   . GER disease Daughter   . Fibromyalgia  Daughter   . Crohn's disease Daughter   . Asthma Daughter   . Hypertension Mother     SOCIAL HISTORY: Social History   Socioeconomic History  . Marital status: Widowed    Spouse name: Not on file  . Number of children: Not on file  . Years of education: Not on file  . Highest education level: Not on file  Occupational History  . Not on file  Social Needs  . Financial resource strain: Not on file  . Food insecurity:    Worry: Not on file    Inability: Not on file  . Transportation needs:    Medical: Not on file    Non-medical: Not on file  Tobacco Use  . Smoking status: Never Smoker  . Smokeless tobacco: Never Used  Substance and Sexual Activity  . Alcohol use: No  . Drug use: No  . Sexual activity: Not on file  Lifestyle  . Physical activity:    Days per week: Not on file    Minutes per session: Not on file  . Stress: Not on file  Relationships  . Social connections:    Talks on phone: Not on file    Gets together: Not on file    Attends religious service: Not on file    Active member of club or organization: Not on file    Attends meetings of clubs or  organizations: Not on file    Relationship status: Not on file  . Intimate partner violence:    Fear of current or ex partner: Not on file    Emotionally abused: Not on file    Physically abused: Not on file    Forced sexual activity: Not on file  Other Topics Concern  . Not on file  Social History Narrative   Pt lives in 1 story home with her daughter, [name] and [name]'s husband   Has 2 adult daughters   Highest level of education: some college   Worked mainly as Web designer.    REVIEW OF SYSTEMS: Constitutional: No fevers, chills, or sweats, no generalized fatigue, change in appetite Eyes: No visual changes, double vision, eye pain Ear, nose and throat: No hearing loss, ear pain, nasal congestion, sore throat Cardiovascular: No chest pain, palpitations Respiratory:  No shortness of breath at rest or with exertion, wheezes GastrointestinaI: No nausea, vomiting, diarrhea, abdominal pain, fecal incontinence Genitourinary:  No dysuria, urinary retention or frequency Musculoskeletal:  + neck pain, back pain Integumentary: No rash, pruritus, skin lesions Neurological: as above Psychiatric: No depression, insomnia, anxiety Endocrine: No palpitations, fatigue, diaphoresis, mood swings, change in appetite, change in weight, increased thirst Hematologic/Lymphatic:  No anemia, purpura, petechiae. Allergic/Immunologic: no itchy/runny eyes, nasal congestion, recent allergic reactions, rashes  PHYSICAL EXAM: Vitals:   08/08/17 1539  BP: 112/60  Pulse: 77  SpO2: 90%   General: No acute distress Head:  Normocephalic/atraumatic Neck: supple, no paraspinal tenderness, full range of motion Heart:  Regular rate and rhythm Lungs:  Clear to auscultation bilaterally Back: No paraspinal tenderness Skin/Extremities: No rash, no edema Neurological Exam: alert and oriented to person, place, and time. No aphasia or dysarthria. Fund of knowledge is appropriate.  Recent and remote memory  are intact.  Attention and concentration are normal.    Able to name objects and repeat phrases. Cranial nerves: Pupils equal, round, reactive to light.  Fundoscopic exam unremarkable, no papilledema. Extraocular movements intact with no nystagmus. Visual fields full. Facial sensation intact. No facial asymmetry. Tongue, uvula, palate  midline.  Motor: Bulk and tone normal, muscle strength 5/5 throughout with no pronator drift.  Sensation to light touch intact.  No extinction to double simultaneous stimulation.  Deep tendon reflexes 2+ throughout, toes downgoing.  Finger to nose testing intact.  Gait slow and cautious, no ataxia.  IMPRESSION: This is an 83 yo RH woman with a history of hypertension, diabetes, rheumatoid arthritis, breast cancer, with chronic migraines and mild dementia. She continues to report daily headaches and feels Botox worsened headaches. She agrees to doing 1 more round of Botox. She has tried several headache preventative medications, we will try to get Ajovy approved through her insurance. She would like to try Imitrex again, I discussed that this is not a medication I would use in patients with stroke, she would like to continue it and will minimize intake to 2 a week. Continue Aricept for mild dementia. We discussed the stroke on MRI brain, start daily aspirin 46m, proceed with carotid dopplers. Continue control of vascular risk factors for secondary stroke prevention. She knows to go to the ER for any sudden change in symptoms and will follow-up in 6 months.  Thank you for allowing me to participate in her care.  Please do not hesitate to call for any questions or concerns.  The duration of this appointment visit was 30 minutes of face-to-face time with the patient.  Greater than 50% of this time was spent in counseling, explanation of diagnosis, planning of further management, and coordination of care.   KEllouise Newer M.D.   CC: KAlma Friendly NP

## 2017-08-14 ENCOUNTER — Other Ambulatory Visit: Payer: Self-pay | Admitting: Primary Care

## 2017-08-14 ENCOUNTER — Encounter: Payer: Self-pay | Admitting: Neurology

## 2017-08-14 DIAGNOSIS — E039 Hypothyroidism, unspecified: Secondary | ICD-10-CM

## 2017-08-18 ENCOUNTER — Inpatient Hospital Stay: Admission: RE | Admit: 2017-08-18 | Payer: Medicare HMO | Source: Ambulatory Visit

## 2017-08-29 ENCOUNTER — Other Ambulatory Visit: Payer: Self-pay | Admitting: Primary Care

## 2017-08-29 DIAGNOSIS — E039 Hypothyroidism, unspecified: Secondary | ICD-10-CM

## 2017-08-30 ENCOUNTER — Encounter: Payer: Self-pay | Admitting: Primary Care

## 2017-08-31 ENCOUNTER — Other Ambulatory Visit: Payer: Self-pay | Admitting: Primary Care

## 2017-08-31 DIAGNOSIS — E039 Hypothyroidism, unspecified: Secondary | ICD-10-CM

## 2017-09-01 ENCOUNTER — Ambulatory Visit
Admission: RE | Admit: 2017-09-01 | Discharge: 2017-09-01 | Disposition: A | Discharge status: Home / Self Care | Payer: Medicare HMO | Source: Ambulatory Visit | Attending: Primary Care | Admitting: Primary Care

## 2017-09-01 DIAGNOSIS — Z853 Personal history of malignant neoplasm of breast: Secondary | ICD-10-CM

## 2017-09-01 DIAGNOSIS — R928 Other abnormal and inconclusive findings on diagnostic imaging of breast: Secondary | ICD-10-CM | POA: Diagnosis not present

## 2017-09-01 DIAGNOSIS — Z1239 Encounter for other screening for malignant neoplasm of breast: Secondary | ICD-10-CM

## 2017-09-04 ENCOUNTER — Telehealth: Payer: Self-pay | Admitting: Neurology

## 2017-09-04 DIAGNOSIS — I639 Cerebral infarction, unspecified: Secondary | ICD-10-CM

## 2017-09-04 DIAGNOSIS — G3184 Mild cognitive impairment, so stated: Secondary | ICD-10-CM

## 2017-09-04 DIAGNOSIS — IMO0002 Reserved for concepts with insufficient information to code with codable children: Secondary | ICD-10-CM

## 2017-09-04 DIAGNOSIS — R42 Dizziness and giddiness: Secondary | ICD-10-CM

## 2017-09-04 DIAGNOSIS — G43709 Chronic migraine without aura, not intractable, without status migrainosus: Secondary | ICD-10-CM

## 2017-09-04 NOTE — Telephone Encounter (Signed)
Patient daughter called and states that we were sending her mother to have a test done on the Carotid but they have not heard from anyone so she was calling to check the status of that

## 2017-09-06 ENCOUNTER — Ambulatory Visit (INDEPENDENT_AMBULATORY_CARE_PROVIDER_SITE_OTHER): Payer: Medicare HMO | Admitting: Primary Care

## 2017-09-06 ENCOUNTER — Encounter: Payer: Self-pay | Admitting: Primary Care

## 2017-09-06 VITALS — BP 124/74 | HR 69 | Temp 98.0°F | Ht 63.5 in | Wt 166.1 lb

## 2017-09-06 DIAGNOSIS — I10 Essential (primary) hypertension: Secondary | ICD-10-CM | POA: Diagnosis not present

## 2017-09-06 DIAGNOSIS — R350 Frequency of micturition: Secondary | ICD-10-CM

## 2017-09-06 DIAGNOSIS — E039 Hypothyroidism, unspecified: Secondary | ICD-10-CM

## 2017-09-06 LAB — POC URINALSYSI DIPSTICK (AUTOMATED)
BILIRUBIN UA: NEGATIVE
Blood, UA: NEGATIVE
GLUCOSE UA: NEGATIVE
KETONES UA: NEGATIVE
NITRITE UA: NEGATIVE
PH UA: 7 (ref 5.0–8.0)
Spec Grav, UA: 1.015 (ref 1.010–1.025)
Urobilinogen, UA: 1 E.U./dL

## 2017-09-06 LAB — BASIC METABOLIC PANEL
BUN: 13 mg/dL (ref 6–23)
CALCIUM: 10.3 mg/dL (ref 8.4–10.5)
CHLORIDE: 96 meq/L (ref 96–112)
CO2: 31 mEq/L (ref 19–32)
CREATININE: 0.75 mg/dL (ref 0.40–1.20)
GFR: 78.95 mL/min (ref >60.00)
GLUCOSE: 126 mg/dL — AB (ref 70–99)
POTASSIUM: 4.2 meq/L (ref 3.5–5.1)
Sodium: 137 mEq/L (ref 135–145)

## 2017-09-06 LAB — TSH: TSH: 0.19 u[IU]/mL — AB (ref 0.35–4.50)

## 2017-09-06 MED ORDER — SULFAMETHOXAZOLE-TRIMETHOPRIM 800-160 MG PO TABS: 1.0000 | ORAL_TABLET | Freq: Two times a day (BID) | ORAL | 0 refills | Status: DC

## 2017-09-06 NOTE — Patient Instructions (Signed)
Start Bactrim DS (sulfamethoxazole/trimethoprim) tablets for urinary tract infection. Take 1 tablet by mouth twice daily for 5 days.  Push intake of water. Continue Pyridium as needed for symptoms.  Please return if symptoms do not improve in 3-4 days.  Stop by the lab prior to leaving today. I will notify you of your results once received.   It was a pleasure to see you today!

## 2017-09-06 NOTE — Progress Notes (Signed)
Subjective:    Patient ID: Shannon Higgins. Fricke, female    DOB: [birth year], 83 y.o.   MRN: [id number]  HPI  Shannon Higgins is an 83 year old female with a history of UTI, type 2 diabetes, breast cancer who presents today with a chief complaint of pelvic pressure.   She also reports vaginal itching, some urinary frequency, foul smell to the urine. She denies dysuria, hematuria, vaginal discharge. Her symptoms began intermittently 2 weeks ago. She's been using pyridium OTC with temporary improvement.  These symptoms feel exactly like her prior UTI's.   Review of Systems  Constitutional: Negative for fever.  Genitourinary: Positive for frequency and pelvic pain. Negative for dysuria, hematuria and vaginal discharge.       Vaginal itching       Past Medical History:  Diagnosis Date  . Asthma   . Breast cancer ([place])   . Chronic sinusitis   . Diabetes mellitus without complication ([place])   . GERD (gastroesophageal reflux disease)   . History of recurrent UTIs   . Hypertension   . Insomnia   . Migraine   . Neuropathic pain   . Pneumonia   . Rheumatoid arthritis ([place])   . Sleep apnea   . Stroke ([place])   . Thyroid disease      Social History   Socioeconomic History  . Marital status: Widowed    Spouse name: Not on file  . Number of children: Not on file  . Years of education: Not on file  . Highest education level: Not on file  Occupational History  . Not on file  Social Needs  . Financial resource strain: Not on file  . Food insecurity:    Worry: Not on file    Inability: Not on file  . Transportation needs:    Medical: Not on file    Non-medical: Not on file  Tobacco Use  . Smoking status: Never Smoker  . Smokeless tobacco: Never Used  Substance and Sexual Activity  . Alcohol use: No  . Drug use: No  . Sexual activity: Not on file  Lifestyle  . Physical activity:    Days per week: Not on file    Minutes per session: Not on file  . Stress: Not on file  Relationships    . Social connections:    Talks on phone: Not on file    Gets together: Not on file    Attends religious service: Not on file    Active member of club or organization: Not on file    Attends meetings of clubs or organizations: Not on file    Relationship status: Not on file  . Intimate partner violence:    Fear of current or ex partner: Not on file    Emotionally abused: Not on file    Physically abused: Not on file    Forced sexual activity: Not on file  Other Topics Concern  . Not on file  Social History Narrative   Pt lives in 1 story home with her daughter, Shannon Higgins and [name]'s husband   Has 2 adult daughters   Highest level of education: some college   Worked mainly as Web designer.    Past Surgical History:  Procedure Laterality Date  . ABDOMINAL HYSTERECTOMY    . BACK SURGERY    . BREAST LUMPECTOMY    . CHOLECYSTECTOMY    . KNEE SURGERY    . SHOULDER SURGERY      Family History  Problem  Relation Age of Onset  . Diabetes Daughter   . Hypertension Daughter   . Fibromyalgia Daughter   . GER disease Daughter   . Fibromyalgia Daughter   . Crohn's disease Daughter   . Asthma Daughter   . Hypertension Mother     No Known Allergies  Current Outpatient Medications on File Prior to Visit  Medication Sig Dispense Refill  . anastrozole (ARIMIDEX) 1 MG tablet Take 1 mg by mouth daily.    . blood glucose meter kit and supplies Dispense based on patient and insurance preference. Use up to 2 times daily as directed. (FOR ICD-10 E10.9, E11.9). 1 each 0  . brimonidine (ALPHAGAN) 0.2 % ophthalmic solution Place 1 drop into both eyes 3 (three) times daily.    . cetirizine (ZYRTEC) 10 MG chewable tablet Chew 10 mg by mouth daily.    [name] donepezil (ARICEPT) 10 MG tablet Take 1/2 tablet daily for a month, then increase to 1 tablet daily 30 tablet 11  . DULoxetine (CYMBALTA) 60 MG capsule Take 1 capsule (60 mg total) by mouth daily. 90 capsule 3  . fluticasone (FLONASE) 50  MCG/ACT nasal spray Place 2 sprays into both nostrils daily. 48 g 3  . Fremanezumab-vfrm (AJOVY) 225 MG/1.5ML SOSY Inject 1 Units into the skin every 30 (thirty) days. 1 Syringe 11  . gabapentin (NEURONTIN) 600 MG tablet Take 600 mg by mouth 4 (four) times daily.    [name] guaiFENesin (MUCINEX) 600 MG 12 hr tablet Take 1 tablet (600 mg total) by mouth 2 (two) times daily. 15 tablet 0  . latanoprost (XALATAN) 0.005 % ophthalmic solution Place 1 drop into both eyes at bedtime.    [name] levothyroxine (SYNTHROID, LEVOTHROID) 125 MCG tablet Take 1 tablet by mouth on an empty stomach with a full glass of water. No food or medications for 30 minutes 30 tablet 0  . meclizine (ANTIVERT) 25 MG tablet Take 1 tablet (25 mg total) by mouth 2 (two) times daily as needed for dizziness. 180 tablet 0  . meloxicam (MOBIC) 15 MG tablet Take 1 tablet by mouth once daily as needed for pain. 90 tablet 1  . metFORMIN (GLUCOPHAGE) 500 MG tablet Take 500 mg by mouth 2 (two) times daily with a meal.    . omeprazole (PRILOSEC) 20 MG capsule Take 1 capsule (20 mg total) by mouth 2 (two) times daily. 180 capsule 1  . pravastatin (PRAVACHOL) 40 MG tablet Take 40 mg by mouth at bedtime.    . propranolol (INDERAL) 80 MG tablet Take 1 tablet (80 mg total) by mouth daily. 90 tablet 3  . SUMAtriptan (IMITREX) 25 MG tablet Take as needed for migraine. Do not take more than twice a week 8 tablet 5  . amoxicillin (AMOXIL) 500 MG capsule Take 4 capsules by mouth one hour prior to appiontment     No current facility-administered medications on file prior to visit.     BP 124/74   Pulse 69   Temp 98 F (36.7 C) (Oral)   Ht 5' 3.5" (1.613 m)   Wt 166 lb 1.9 oz (75.4 kg)   SpO2 96%   BMI 28.97 kg/m    Objective:   Physical Exam  Constitutional: She is oriented to person, place, and time. She appears well-nourished.  Neck: Neck supple.  Cardiovascular: Normal rate and regular rhythm.  Pulmonary/Chest: Effort normal and breath sounds  normal.  Abdominal: Soft. Bowel sounds are normal. There is no tenderness. There is no CVA tenderness.  Neurological: She is alert and oriented to person, place, and time.  Skin: Skin is warm and dry.          Assessment & Plan:  Pelvic Pressure:  Also with urinary frequency, vaginal itching. UA today with 2+ leuks, negative nitrites, negative blood. Culture sent. Will go ahead and treat for presumed acute cystitis given evidence of leuks. Rx for Bactrim Course sent to pharmacy, E. Coli on prior culture with sensitivity to Bactrim DS.  Push fluids, rest, follow up PRN.  Pleas Koch, NP

## 2017-09-06 NOTE — Telephone Encounter (Signed)
Referral placed again with URGENT note to [facility] to contact pt to schedule.

## 2017-09-07 ENCOUNTER — Encounter: Payer: Self-pay | Admitting: Neurology

## 2017-09-08 ENCOUNTER — Other Ambulatory Visit: Payer: Self-pay | Admitting: Primary Care

## 2017-09-08 DIAGNOSIS — E039 Hypothyroidism, unspecified: Secondary | ICD-10-CM

## 2017-09-08 LAB — URINE CULTURE
MICRO NUMBER:: 90561862
SPECIMEN QUALITY:: ADEQUATE

## 2017-09-08 MED ORDER — LEVOTHYROXINE SODIUM 112 MCG PO TABS: ORAL_TABLET | ORAL | 0 refills | Status: DC

## 2017-09-08 NOTE — Assessment & Plan Note (Signed)
Repeat TSH too low, this makes two readings below recommended range. Will decrease levothyroxine to 112 mcg. Repeat TSH in 6 weeks.

## 2017-09-19 ENCOUNTER — Telehealth: Payer: Self-pay | Admitting: Oncology

## 2017-09-19 NOTE — Telephone Encounter (Signed)
Tc to the pt's daughter, [name], to reschedule the pt to see Dr. Jana Hakim. Appt has been scheduled for the pt to see Dr. Jana Hakim on 6/18 at 4pm.

## 2017-09-26 ENCOUNTER — Encounter: Payer: Self-pay | Admitting: Primary Care

## 2017-09-29 ENCOUNTER — Ambulatory Visit (INDEPENDENT_AMBULATORY_CARE_PROVIDER_SITE_OTHER): Payer: Medicare HMO | Admitting: Neurology

## 2017-09-29 DIAGNOSIS — G43709 Chronic migraine without aura, not intractable, without status migrainosus: Secondary | ICD-10-CM | POA: Diagnosis not present

## 2017-09-29 MED ORDER — ONABOTULINUMTOXINA 100 UNITS IJ SOLR
155.0000 [IU] | Freq: Once | INTRAMUSCULAR | Status: AC
  Administered 2017-09-29: 155 [IU] via INTRAMUSCULAR

## 2017-09-29 NOTE — Progress Notes (Signed)
[facility]   Procedure Note Botox  Attending: Dr. Tomi Likens  Preoperative Diagnosis(es): Chronic migraine  Consent obtained from: The patient Benefits discussed included, but were not limited to decreased muscle tightness, increased joint range of motion, and decreased pain.  Risk discussed included, but were not limited pain and discomfort, bleeding, bruising, excessive weakness, venous thrombosis, muscle atrophy and dysphagia.  Anticipated outcomes of the procedure as well as he risks and benefits of the alternatives to the procedure, and the roles and tasks of the personnel to be involved, were discussed with the patient, and the patient consents to the procedure and agrees to proceed. A copy of the patient medication guide was given to the patient which explains the blackbox warning.  Patients identity and treatment sites confirmed Yes.  .  Details of Procedure: Skin was cleaned with alcohol. Prior to injection, the needle plunger was aspirated to make sure the needle was not within a blood vessel.  There was no blood retrieved on aspiration.    Following is a summary of the muscles injected  And the amount of Botulinum toxin used:  Dilution 200 units of Botox was reconstituted with 4 ml of preservative free normal saline. Time of reconstitution: At the time of the office visit (<30 minutes prior to injection)   Injections  155 total units of Botox was injected with a 30 gauge needle.  Injection Sites: L occipitalis: 15 units- 3 sites  R occiptalis: 15 units- 3 sites  L upper trapezius: 15 units- 3 sites R upper trapezius: 15 units- 3 sits          L paraspinal: 10 units- 2 sites R paraspinal: 10 units- 2 sites  Face L frontalis(2 injection sites):10 units   R frontalis(2 injection sites):10 units         L corrugator: 5 units   R corrugator: 5 units           Procerus: 5 units   L temporalis: 20 units R temporalis: 20 units   Agent:  200 units of botulinum Type A  (Onobotulinum Toxin type A) was reconstituted with 4 ml of preservative free normal saline.  Time of reconstitution: At the time of the office visit (<30 minutes prior to injection)     Total injected (Units): 155  Total wasted (Units): None wasted  Patient tolerated procedure well without complications.   Reinjection is anticipated in 3 months.

## 2017-10-05 ENCOUNTER — Encounter: Payer: Self-pay | Admitting: Primary Care

## 2017-10-05 DIAGNOSIS — G4700 Insomnia, unspecified: Secondary | ICD-10-CM

## 2017-10-05 MED ORDER — TRAZODONE HCL 50 MG PO TABS: 50.0000 mg | ORAL_TABLET | Freq: Every evening | ORAL | 0 refills | Status: DC | PRN

## 2017-10-16 NOTE — Progress Notes (Signed)
[facility]  [phone] [phone]     Nb: PATIENT DID NOT SHOW FOR HER 10/17/2017 VISIT   ID: Shannon Higgins DOB: [birth year]  MR#: [id number]  DJM#:[id number]  Patient Care Team: Pleas Koch, NP as PCP - General (Internal Medicine) OTHER MD:  CHIEF COMPLAINT:   CURRENT TREATMENT:    HISTORY OF CURRENT ILLNESS: Amberli Ruegg. Santini has a history of left sided breast cancer diagnosed in April 2015. At the time, the patient was seeking care in [place], [place]. Pathology from her initial biopsy on 08/16/2017 showed ([place]): At the 2:30 position of the left breast upper outer quadrant, a 0.2 x 0.4 x 0.3 cm mass positive for invasive ductal carcinoma low grade with focal ductal carcinoma in situ. A left "inframammary ymph node" was also positive for invasive carcinoma. There was no lymphoid tissue in this sample, which was contiguous to the first one and may have been part of it or a close satellite.The prognostic indicators were significant for estrogen receptor 96% positive and progesterone 13% positive. HER-2/neu not amplified with a Ki-67 of 4%.  Estrogen and progesterone receptor results from the second specimen were similar (98% estrogen receptor, 2% progesterone receptor).  An Oncotype DX was obtained from this biopsy with a score was 9, predicting a risk of outside the breast recurrence over the next 10 years of 7% if the patient's only systemic therapy is tamoxifen for 5 years.  It also predicted no significant benefit from chemotherapy.  She underwent left lumpectomy and sentinel lymph node sampling on 09/27/2013 showing ([id number]) Invasive ductal carcinoma grade 1, spanning 1.0 x 0.8 cm in greatest extent. 2 foci for invasive ductal carcinoma. Ductal carcinoma in situ. Negative margins. 2 Left axillary lymph nodes were negative for metastatic carcinoma.  The patient decided against adjuvant radiation.  The patient is currently taking  anastrozole.   The patient's subsequent history is as detailed below.  INTERVAL HISTORY:    REVIEW OF SYSTEMS:   PAST MEDICAL HISTORY: Past Medical History:  Diagnosis Date  . Asthma   . Breast cancer ([place])   . Chronic sinusitis   . Diabetes mellitus without complication ([place])   . GERD (gastroesophageal reflux disease)   . History of recurrent UTIs   . Hypertension   . Insomnia   . Migraine   . Neuropathic pain   . Pneumonia   . Rheumatoid arthritis ([place])   . Sleep apnea   . Stroke ([place])   . Thyroid disease     PAST SURGICAL HISTORY: Past Surgical History:  Procedure Laterality Date  . ABDOMINAL HYSTERECTOMY    . BACK SURGERY    . BREAST LUMPECTOMY    . CHOLECYSTECTOMY    . KNEE SURGERY    . SHOULDER SURGERY      FAMILY HISTORY Family History  Problem Relation Age of Onset  . Diabetes Daughter   . Hypertension Daughter   . Fibromyalgia Daughter   . GER disease Daughter   . Fibromyalgia Daughter   . Crohn's disease Daughter   . Asthma Daughter   . Hypertension Mother     GYNECOLOGIC HISTORY:  No LMP recorded. Patient has had a hysterectomy. Menarche:  years old Age at first live birth:  years old [place]  LMP  Contraceptive HRT   Hysterectomy?  SO?    SOCIAL HISTORY:      ADVANCED DIRECTIVES:    HEALTH MAINTENANCE: Social History   Tobacco Use  . Smoking status:  Never Smoker  . Smokeless tobacco: Never Used  Substance Use Topics  . Alcohol use: No  . Drug use: No     Colonoscopy: 03/15/2011 Dr. Darrelyn Hillock / polyp and H. pylori  PAP:  Bone density:   No Known Allergies  Current Outpatient Medications  Medication Sig Dispense Refill  . amoxicillin (AMOXIL) 500 MG capsule Take 4 capsules by mouth one hour prior to appiontment    . anastrozole (ARIMIDEX) 1 MG tablet Take 1 mg by mouth daily.    . blood glucose meter kit and supplies Dispense based on patient and insurance preference. Use up to 2 times daily as directed. (FOR ICD-10  E10.9, E11.9). 1 each 0  . brimonidine (ALPHAGAN) 0.2 % ophthalmic solution Place 1 drop into both eyes 3 (three) times daily.    . cetirizine (ZYRTEC) 10 MG chewable tablet Chew 10 mg by mouth daily.    [name] donepezil (ARICEPT) 10 MG tablet Take 1/2 tablet daily for a month, then increase to 1 tablet daily 30 tablet 11  . DULoxetine (CYMBALTA) 60 MG capsule Take 1 capsule (60 mg total) by mouth daily. 90 capsule 3  . fluticasone (FLONASE) 50 MCG/ACT nasal spray Place 2 sprays into both nostrils daily. 48 g 3  . Fremanezumab-vfrm (AJOVY) 225 MG/1.5ML SOSY Inject 1 Units into the skin every 30 (thirty) days. 1 Syringe 11  . gabapentin (NEURONTIN) 600 MG tablet Take 600 mg by mouth 4 (four) times daily.    [name] guaiFENesin (MUCINEX) 600 MG 12 hr tablet Take 1 tablet (600 mg total) by mouth 2 (two) times daily. 15 tablet 0  . latanoprost (XALATAN) 0.005 % ophthalmic solution Place 1 drop into both eyes at bedtime.    [name] levothyroxine (SYNTHROID) 112 MCG tablet Take 1 tablet by mouth every morning on an empty stomach with a full glass of water. No food or other medications for 30 minutes. 90 tablet 0  . meclizine (ANTIVERT) 25 MG tablet Take 1 tablet (25 mg total) by mouth 2 (two) times daily as needed for dizziness. 180 tablet 0  . meloxicam (MOBIC) 15 MG tablet Take 1 tablet by mouth once daily as needed for pain. 90 tablet 1  . metFORMIN (GLUCOPHAGE) 500 MG tablet Take 500 mg by mouth 2 (two) times daily with a meal.    . omeprazole (PRILOSEC) 20 MG capsule Take 1 capsule (20 mg total) by mouth 2 (two) times daily. 180 capsule 1  . pravastatin (PRAVACHOL) 40 MG tablet Take 40 mg by mouth at bedtime.    . propranolol (INDERAL) 80 MG tablet Take 1 tablet (80 mg total) by mouth daily. 90 tablet 3  . sulfamethoxazole-trimethoprim (BACTRIM DS,SEPTRA DS) 800-160 MG tablet Take 1 tablet by mouth 2 (two) times daily. 10 tablet 0  . SUMAtriptan (IMITREX) 25 MG tablet Take as needed for migraine. Do not take more  than twice a week 8 tablet 5  . traZODone (DESYREL) 50 MG tablet Take 1 tablet (50 mg total) by mouth at bedtime as needed for sleep. 90 tablet 0   No current facility-administered medications for this visit.     OBJECTIVE:  There were no vitals filed for this visit.   There is no height or weight on file to calculate BMI.   Wt Readings from Last 3 Encounters:  09/06/17 166 lb 1.9 oz (75.4 kg)  08/08/17 166 lb (75.3 kg)  06/21/17 169 lb 12 oz (77 kg)      ECOG FS:  LAB RESULTS:  CMP     Component Value Date/Time   NA 137 09/06/2017 1240   K 4.2 09/06/2017 1240   CL 96 09/06/2017 1240   CO2 31 09/06/2017 1240   GLUCOSE 126 (H) 09/06/2017 1240   BUN 13 09/06/2017 1240   CREATININE 0.75 09/06/2017 1240   CALCIUM 10.3 09/06/2017 1240   PROT 7.6 06/21/2017 1616   ALBUMIN 4.3 06/21/2017 1616   AST 25 06/21/2017 1616   ALT 21 06/21/2017 1616   ALKPHOS 98 06/21/2017 1616   BILITOT 0.3 06/21/2017 1616   GFRNONAA 51 (L) 11/11/2016 2150   GFRAA 59 (L) 11/11/2016 2150    No results found for: TOTALPROTELP, ALBUMINELP, A1GS, A2GS, BETS, BETA2SER, GAMS, MSPIKE, SPEI  No results found for: KPAFRELGTCHN, LAMBDASER, KAPLAMBRATIO  Lab Results  Component Value Date   WBC 12.7 (H) 06/21/2017   NEUTROABS 6.1 06/13/2016   HGB 13.4 06/21/2017   HCT 42.6 06/21/2017   MCV 88.6 06/21/2017   PLT 365.0 06/21/2017    @LASTCHEMISTRY @  No results found for: LABCA2  No components found for: QJJHER740  No results for input(s): INR in the last 168 hours.  No results found for: LABCA2  No results found for: CXK481  No results found for: EHU314  No results found for: HFW263  No results found for: CA2729  No components found for: HGQUANT  No results found for: CEA1 / No results found for: CEA1   No results found for: AFPTUMOR  No results found for: CHROMOGRNA  No results found for: PSA1  No visits with results within 3 Day(s) from this visit.  Latest known visit with  results is:  Office Visit on 09/06/2017  Component Date Value Ref Range Status  . MICRO NUMBER: 09/06/2017 [id number]   Final  . SPECIMEN QUALITY: 09/06/2017 ADEQUATE   Final  . Sample Source 09/06/2017 URINE   Final  . STATUS: 09/06/2017 FINAL   Final  . ISOLATE 1: 09/06/2017 Klebsiella pneumoniae*  Final   50,000-100,000 CFU/mL of Klebsiella pneumoniae  . Color, UA 09/06/2017 yellow   Final  . Clarity, UA 09/06/2017 cloudy   Final  . Glucose, UA 09/06/2017 neg   Final  . Bilirubin, UA 09/06/2017 neg   Final  . Ketones, UA 09/06/2017 neg   Final  . Spec Grav, UA 09/06/2017 1.015  1.010 - 1.025 Final  . Blood, UA 09/06/2017 neg   Final  . pH, UA 09/06/2017 7.0  5.0 - 8.0 Final  . Protein, UA 09/06/2017 +-   Final  . Urobilinogen, UA 09/06/2017 1.0  0.2 or 1.0 E.U./dL Final  . Nitrite, UA 09/06/2017 neg   Final  . Leukocytes, UA 09/06/2017 Moderate (2+)* Negative Final  . Sodium 09/06/2017 137  135 - 145 mEq/L Final  . Potassium 09/06/2017 4.2  3.5 - 5.1 mEq/L Final  . Chloride 09/06/2017 96  96 - 112 mEq/L Final  . CO2 09/06/2017 31  19 - 32 mEq/L Final  . Glucose, Bld 09/06/2017 126* 70 - 99 mg/dL Final  . BUN 09/06/2017 13  6 - 23 mg/dL Final  . Creatinine, Ser 09/06/2017 0.75  0.40 - 1.20 mg/dL Final  . Calcium 09/06/2017 10.3  8.4 - 10.5 mg/dL Final  . GFR 09/06/2017 78.95  >60.00 mL/min Final  . TSH 09/06/2017 0.19* 0.35 - 4.50 uIU/mL Final    (this displays the last labs from the last 3 days)  No results found for: TOTALPROTELP, ALBUMINELP, A1GS, A2GS, BETS, BETA2SER, GAMS, MSPIKE, SPEI (this  displays SPEP labs)  No results found for: KPAFRELGTCHN, LAMBDASER, KAPLAMBRATIO (kappa/lambda light chains)  No results found for: HGBA, HGBA2QUANT, HGBFQUANT, HGBSQUAN (Hemoglobinopathy evaluation)   No results found for: LDH  No results found for: IRON, TIBC, IRONPCTSAT (Iron and TIBC)  No results found for: FERRITIN  Urinalysis    Component Value Date/Time    COLORURINE YELLOW 11/11/2016 2230   APPEARANCEUR HAZY (A) 11/11/2016 2230   LABSPEC 1.011 11/11/2016 2230   PHURINE 5.0 11/11/2016 2230   GLUCOSEU NEGATIVE 11/11/2016 2230   HGBUR NEGATIVE 11/11/2016 2230   BILIRUBINUR neg 09/06/2017 1232   KETONESUR NEGATIVE 11/11/2016 2230   PROTEINUR +- 09/06/2017 1232   PROTEINUR NEGATIVE 11/11/2016 2230   UROBILINOGEN 1.0 09/06/2017 1232   UROBILINOGEN 0.2 04/26/2012 1547   NITRITE neg 09/06/2017 1232   NITRITE NEGATIVE 11/11/2016 2230   LEUKOCYTESUR Moderate (2+) (A) 09/06/2017 1232     STUDIES: No results found.  ELIGIBLE FOR AVAILABLE RESEARCH PROTOCOL:   ASSESSMENT: 83 y.o. [place], [place] woman status post left breast upper outer quadrant biopsy 08/16/2013 (2 adjacent samples) for a clinical T1b NX invasive ductal carcinoma, low-grade, estrogen and progesterone receptor positive, HER-2 not amplified, with an MIB-1 of 4%.  (1) status post left lumpectomy and sentinel lymph node sampling 09/27/2013 for an mpT1b pN0, stage IA invasive ductal carcinoma, grade 1, with extensive ductal carcinoma in situ but negative margins  (a) 2 sentinel lymph nodes were removed  (2) Oncotype score of 9 predicts and the risk of recurrence outside the breast within the next 10 years of 7% if the patient's only systemic therapy is tamoxifen for 5 years.  It also predicted no benefit from chemotherapy.  (3) patient opted against radiation  (4) anastrozole started  (5) genetics testing  PLAN:   Jeramie Scogin, Virgie Dad, MD  10/16/17 5:15 PM Medical Oncology and Hematology [facility] [address] [place], [place] [address] Tel. [phone]    Fax. [phone]  Alice Rieger, am acting as scribe for Chauncey Cruel MD.  I, Lurline Del MD, have reviewed the above documentation for accuracy and completeness, and I agree with the above.

## 2017-10-17 ENCOUNTER — Encounter: Payer: Self-pay | Admitting: Oncology

## 2017-10-17 ENCOUNTER — Inpatient Hospital Stay: Payer: Medicare HMO | Attending: Oncology | Admitting: Oncology

## 2017-10-17 DIAGNOSIS — Z170 Estrogen receptor positive status [ER+]: Principal | ICD-10-CM

## 2017-10-17 DIAGNOSIS — Z853 Personal history of malignant neoplasm of breast: Secondary | ICD-10-CM | POA: Insufficient documentation

## 2017-10-17 DIAGNOSIS — C50419 Malignant neoplasm of upper-outer quadrant of unspecified female breast: Secondary | ICD-10-CM | POA: Insufficient documentation

## 2017-10-17 DIAGNOSIS — C50412 Malignant neoplasm of upper-outer quadrant of left female breast: Principal | ICD-10-CM

## 2017-10-20 ENCOUNTER — Other Ambulatory Visit: Payer: Medicare HMO

## 2017-10-20 ENCOUNTER — Telehealth: Payer: Self-pay | Admitting: Primary Care

## 2017-10-20 DIAGNOSIS — M792 Neuralgia and neuritis, unspecified: Secondary | ICD-10-CM

## 2017-10-20 NOTE — Telephone Encounter (Signed)
Copied from [place] [id number]. Topic: Quick Communication - Rx Refill/Question >> Oct 20, 2017  3:40 PM Boyd Kerbs wrote: Medication:   gabapentin (NEURONTIN) 600 MG tablet She is out  Has the patient contacted their pharmacy? No. (Agent: If no, request that the patient contact the pharmacy for the refill.) (Agent: If yes, when and what did the pharmacy advise?)  Preferred Pharmacy (with phone number or street name):  [id number] - [place], [place] [address] [place] [address] Phone: [phone] Fax: [phone]    Agent: Please be advised that RX refills may take up to 3 business days. We ask that you follow-up with your pharmacy.

## 2017-10-23 NOTE — Telephone Encounter (Signed)
Gabapentin 600mg  refill Last Refilled by historical provider Last OV: 09/06/17 PCP: Pincus Sanes [facility] [place]

## 2017-10-23 NOTE — Telephone Encounter (Signed)
I'm happy to refill, just need to verify the dose and and how often she's taking this medication.  Thanks.

## 2017-10-24 NOTE — Telephone Encounter (Signed)
Pt never got a call back, asked her what dose  gabapentin (NEURONTIN) 600 MG tablet She says she takes 12 a day.  Said if Allie Bossier could prescribe something stronger that she could take less of per day that would be ok too. if she wants too

## 2017-10-24 NOTE — Telephone Encounter (Signed)
We can change her to the gabapentin 800 mg dose and have her take 1 tablet three times daily. This is the highest dose of gabapentin.  Let me know she verbalizes understanding and I'll change the Rx.

## 2017-10-24 NOTE — Telephone Encounter (Signed)
Patient is calling and states she is taking a total of 2400mg  of Gabapentin totally up to 12 tablets a day. Patient states she would like a stronger tablet to prevent taking so many pills if possible. Can be reached at [phone]

## 2017-10-24 NOTE — Telephone Encounter (Addendum)
Spoken to patient's daughter [name]). She stated that we have patient taking 600 mg 4 times times a day. Daughter will have patient call office and confirm.  The number we have on file is patient's daughter number on both home and mobile.

## 2017-10-24 NOTE — Telephone Encounter (Signed)
Patient daughter called and is returning a missed call . Please call back CB# [phone]

## 2017-10-24 NOTE — Telephone Encounter (Signed)
Message left for patient's daughter (on [place])  to return my call.

## 2017-10-25 MED ORDER — GABAPENTIN 800 MG PO TABS: 800.0000 mg | ORAL_TABLET | Freq: Three times a day (TID) | ORAL | 1 refills | Status: DC

## 2017-10-25 NOTE — Telephone Encounter (Signed)
Noted, [facility] [place]

## 2017-10-25 NOTE — Telephone Encounter (Signed)
Spoken to patient's daughter and they are agreeable to the change. [facility] [place]

## 2017-10-25 NOTE — Telephone Encounter (Signed)
Message left for patient's daughter to return my call.  Cannot reach patient at [phone] due to calling restrictions so that is why had to call patient's daughter

## 2017-10-25 NOTE — Telephone Encounter (Signed)
Pt daughter called back and stated that her mothers phone is cut off and you can give her a call back.   GR#[phone]

## 2017-11-15 ENCOUNTER — Encounter: Payer: Self-pay | Admitting: Primary Care

## 2017-11-15 DIAGNOSIS — M5440 Lumbago with sciatica, unspecified side: Principal | ICD-10-CM

## 2017-11-15 DIAGNOSIS — G8929 Other chronic pain: Secondary | ICD-10-CM

## 2017-11-15 NOTE — Telephone Encounter (Signed)
Shannon Higgins, see message below. Is this something you can take a look at and potentially treat or is this a physical medicine referral? Thanks.

## 2017-11-16 NOTE — Telephone Encounter (Signed)
The only MD's who do epidural steroid or facet injections are pain management or physical medicine and rehab.

## 2017-11-20 ENCOUNTER — Encounter: Payer: Self-pay | Admitting: Family Medicine

## 2017-11-20 ENCOUNTER — Ambulatory Visit (INDEPENDENT_AMBULATORY_CARE_PROVIDER_SITE_OTHER): Payer: Medicare HMO | Admitting: Family Medicine

## 2017-11-20 VITALS — BP 124/80 | HR 71 | Temp 97.8°F | Ht 63.5 in | Wt 169.8 lb

## 2017-11-20 DIAGNOSIS — G4700 Insomnia, unspecified: Secondary | ICD-10-CM

## 2017-11-20 DIAGNOSIS — R350 Frequency of micturition: Secondary | ICD-10-CM | POA: Diagnosis not present

## 2017-11-20 DIAGNOSIS — N3090 Cystitis, unspecified without hematuria: Secondary | ICD-10-CM | POA: Diagnosis not present

## 2017-11-20 DIAGNOSIS — R8290 Unspecified abnormal findings in urine: Secondary | ICD-10-CM | POA: Diagnosis not present

## 2017-11-20 LAB — POC URINALSYSI DIPSTICK (AUTOMATED)
Bilirubin, UA: NEGATIVE
Blood, UA: NEGATIVE
GLUCOSE UA: NEGATIVE
Ketones, UA: NEGATIVE
Nitrite, UA: NEGATIVE
PH UA: 6 (ref 5.0–8.0)
PROTEIN UA: POSITIVE — AB
SPEC GRAV UA: 1.025 (ref 1.010–1.025)
Urobilinogen, UA: 0.2 E.U./dL

## 2017-11-20 NOTE — Patient Instructions (Signed)
Good to see you today  I will notify you of culture results in next day or two  Drink enough water to make urine light yellow

## 2017-11-20 NOTE — Progress Notes (Signed)
Subjective:    Patient ID: Shannon Higgins. Narula, female    DOB: [birth year], 83 y.o.   MRN: [id number]  HPI This is an 83 yo female, accompanied by her daughter. She had burning with urination one day last week, but it has resolved. She denies fever/chills, nausea/vomiting, back pain/abdominal pain. Was staying with her other daughter and likely did not drink as much liquid as she usually does.  Reports difficulty sleeping, usually with going to sleep. Has been taking trazadone 50-200 mg and slepps ok some night, not others. Drinks 1-2 caffenated sodas daily. No ETOH. No regular exercise. Is on phone before bed nightly and will look at it if she awakens in middle of night. Goes to bed around 10 and gets up around 8.   Past Medical History:  Diagnosis Date  . Asthma   . Breast cancer ([place])   . Chronic sinusitis   . Diabetes mellitus without complication ([place])   . GERD (gastroesophageal reflux disease)   . History of recurrent UTIs   . Hypertension   . Insomnia   . Migraine   . Neuropathic pain   . Pneumonia   . Rheumatoid arthritis ([place])   . Sleep apnea   . Stroke ([place])   . Thyroid disease    Past Surgical History:  Procedure Laterality Date  . ABDOMINAL HYSTERECTOMY    . BACK SURGERY    . BREAST LUMPECTOMY    . CHOLECYSTECTOMY    . KNEE SURGERY    . SHOULDER SURGERY     Family History  Problem Relation Age of Onset  . Diabetes Daughter   . Hypertension Daughter   . Fibromyalgia Daughter   . GER disease Daughter   . Fibromyalgia Daughter   . Crohn's disease Daughter   . Asthma Daughter   . Hypertension Mother    Social History   Tobacco Use  . Smoking status: Never Smoker  . Smokeless tobacco: Never Used  Substance Use Topics  . Alcohol use: No  . Drug use: No       Review of Systems Per HPI    Objective:   Physical Exam Physical Exam  Constitutional: She is oriented to person, place, and time. She appears well-developed and well-nourished. No distress.    HENT:  Head: Normocephalic and atraumatic.  Cardiovascular: Normal rate, regular rhythm and normal heart sounds.   Pulmonary/Chest: Effort normal and breath sounds normal.  Abdominal: Soft. She exhibits no distension. There is no tenderness. There is no rebound, no guarding and no CVA tenderness.  Neurological: She is alert and oriented to person, place, and time.  Skin: Skin is warm and dry. She is not diaphoretic.  Psychiatric: She has a normal mood and affect. Her behavior is normal. Judgment and thought content normal.  Vitals reviewed.  BP 124/80 (BP Location: Right Arm, Patient Position: Sitting, Cuff Size: Normal)   Pulse 71   Temp 97.8 F (36.6 C) (Oral)   Ht 5' 3.5" (1.613 m)   Wt 169 lb 12 oz (77 kg)   SpO2 97%   BMI 29.60 kg/m  Wt Readings from Last 3 Encounters:  11/20/17 169 lb 12 oz (77 kg)  09/06/17 166 lb 1.9 oz (75.4 kg)  08/08/17 166 lb (75.3 kg)   Results for orders placed or performed in visit on 11/20/17  POCT Urinalysis Dipstick (Automated)  Result Value Ref Range   Color, UA yellow    Clarity, UA cloudy    Glucose, UA Negative Negative  Bilirubin, UA neg    Ketones, UA neg    Spec Grav, UA 1.025 1.010 - 1.025   Blood, UA neg    pH, UA 6.0 5.0 - 8.0   Protein, UA Positive (A) Negative   Urobilinogen, UA 0.2 0.2 or 1.0 E.U./dL   Nitrite, UA neg    Leukocytes, UA Moderate (2+) (A) Negative       Assessment & Plan:  1. Urinary frequency - POCT Urinalysis Dipstick (Automated)  2. Abnormal urinalysis - as she is asymptomatic today, will await culture results, encouraged improved water intake - Urine Culture  3. Insomnia, unspecified type - discussed good sleep hygiene and she will discuss with her PCP at upcoming appointment   Clarene Reamer, FNP-BC  [facility]  11/21/2017 6:02 AM

## 2017-11-21 ENCOUNTER — Ambulatory Visit
Admission: RE | Admit: 2017-11-21 | Discharge: 2017-11-21 | Disposition: A | Discharge status: Home / Self Care | Payer: Medicare HMO | Source: Ambulatory Visit | Attending: Neurology | Admitting: Neurology

## 2017-11-21 ENCOUNTER — Encounter: Payer: Self-pay | Admitting: Family Medicine

## 2017-11-21 DIAGNOSIS — I639 Cerebral infarction, unspecified: Secondary | ICD-10-CM

## 2017-11-21 DIAGNOSIS — G3184 Mild cognitive impairment, so stated: Secondary | ICD-10-CM

## 2017-11-21 DIAGNOSIS — IMO0002 Reserved for concepts with insufficient information to code with codable children: Secondary | ICD-10-CM

## 2017-11-21 DIAGNOSIS — G43709 Chronic migraine without aura, not intractable, without status migrainosus: Secondary | ICD-10-CM

## 2017-11-21 DIAGNOSIS — I6523 Occlusion and stenosis of bilateral carotid arteries: Secondary | ICD-10-CM | POA: Diagnosis not present

## 2017-11-21 DIAGNOSIS — R42 Dizziness and giddiness: Secondary | ICD-10-CM

## 2017-11-22 LAB — URINE CULTURE
MICRO NUMBER:: 90864464
SPECIMEN QUALITY:: ADEQUATE

## 2017-11-22 MED ORDER — CEPHALEXIN 500 MG PO CAPS: 500.0000 mg | ORAL_CAPSULE | Freq: Three times a day (TID) | ORAL | 0 refills | Status: DC

## 2017-11-22 NOTE — Addendum Note (Signed)
Addended by: Clarene Reamer B on: 11/22/2017 07:54 AM   Modules accepted: Orders

## 2017-11-24 ENCOUNTER — Encounter: Payer: Self-pay | Admitting: Primary Care

## 2017-11-24 DIAGNOSIS — G4700 Insomnia, unspecified: Secondary | ICD-10-CM

## 2017-11-27 MED ORDER — TRAZODONE HCL 100 MG PO TABS: ORAL_TABLET | ORAL | 0 refills | Status: DC

## 2017-12-19 ENCOUNTER — Ambulatory Visit: Payer: Medicare HMO | Admitting: Primary Care

## 2017-12-20 DIAGNOSIS — M1711 Unilateral primary osteoarthritis, right knee: Secondary | ICD-10-CM | POA: Diagnosis not present

## 2017-12-20 DIAGNOSIS — R269 Unspecified abnormalities of gait and mobility: Secondary | ICD-10-CM | POA: Diagnosis not present

## 2017-12-26 ENCOUNTER — Encounter: Payer: Self-pay | Admitting: Primary Care

## 2017-12-26 ENCOUNTER — Ambulatory Visit (INDEPENDENT_AMBULATORY_CARE_PROVIDER_SITE_OTHER): Payer: Medicare HMO | Admitting: Primary Care

## 2017-12-26 VITALS — BP 122/70 | HR 64 | Temp 98.2°F | Ht 63.5 in | Wt 167.5 lb

## 2017-12-26 DIAGNOSIS — E118 Type 2 diabetes mellitus with unspecified complications: Secondary | ICD-10-CM | POA: Diagnosis not present

## 2017-12-26 DIAGNOSIS — R350 Frequency of micturition: Secondary | ICD-10-CM | POA: Diagnosis not present

## 2017-12-26 DIAGNOSIS — Z170 Estrogen receptor positive status [ER+]: Secondary | ICD-10-CM

## 2017-12-26 DIAGNOSIS — M5440 Lumbago with sciatica, unspecified side: Secondary | ICD-10-CM

## 2017-12-26 DIAGNOSIS — E119 Type 2 diabetes mellitus without complications: Secondary | ICD-10-CM | POA: Diagnosis not present

## 2017-12-26 DIAGNOSIS — I10 Essential (primary) hypertension: Secondary | ICD-10-CM

## 2017-12-26 DIAGNOSIS — G4733 Obstructive sleep apnea (adult) (pediatric): Secondary | ICD-10-CM

## 2017-12-26 DIAGNOSIS — M792 Neuralgia and neuritis, unspecified: Secondary | ICD-10-CM | POA: Diagnosis not present

## 2017-12-26 DIAGNOSIS — K219 Gastro-esophageal reflux disease without esophagitis: Secondary | ICD-10-CM | POA: Diagnosis not present

## 2017-12-26 DIAGNOSIS — E039 Hypothyroidism, unspecified: Secondary | ICD-10-CM | POA: Diagnosis not present

## 2017-12-26 DIAGNOSIS — C50412 Malignant neoplasm of upper-outer quadrant of left female breast: Secondary | ICD-10-CM

## 2017-12-26 DIAGNOSIS — G3184 Mild cognitive impairment, so stated: Secondary | ICD-10-CM | POA: Diagnosis not present

## 2017-12-26 DIAGNOSIS — R51 Headache: Secondary | ICD-10-CM

## 2017-12-26 DIAGNOSIS — E785 Hyperlipidemia, unspecified: Secondary | ICD-10-CM | POA: Diagnosis not present

## 2017-12-26 DIAGNOSIS — E1140 Type 2 diabetes mellitus with diabetic neuropathy, unspecified: Secondary | ICD-10-CM | POA: Diagnosis not present

## 2017-12-26 DIAGNOSIS — R519 Headache, unspecified: Secondary | ICD-10-CM

## 2017-12-26 DIAGNOSIS — R410 Disorientation, unspecified: Secondary | ICD-10-CM

## 2017-12-26 DIAGNOSIS — F330 Major depressive disorder, recurrent, mild: Secondary | ICD-10-CM

## 2017-12-26 DIAGNOSIS — G8929 Other chronic pain: Secondary | ICD-10-CM

## 2017-12-26 DIAGNOSIS — J309 Allergic rhinitis, unspecified: Secondary | ICD-10-CM

## 2017-12-26 DIAGNOSIS — M069 Rheumatoid arthritis, unspecified: Secondary | ICD-10-CM | POA: Diagnosis not present

## 2017-12-26 LAB — POC URINALSYSI DIPSTICK (AUTOMATED)
GLUCOSE UA: NEGATIVE
Leukocytes, UA: NEGATIVE
Nitrite, UA: NEGATIVE
Protein, UA: POSITIVE — AB
RBC UA: NEGATIVE
UROBILINOGEN UA: 0.2 U/dL
pH, UA: 6 (ref 5.0–8.0)

## 2017-12-26 MED ORDER — MELOXICAM 15 MG PO TABS: ORAL_TABLET | ORAL | 0 refills | Status: DC

## 2017-12-26 MED ORDER — PRAVASTATIN SODIUM 40 MG PO TABS: ORAL_TABLET | ORAL | 3 refills | Status: DC

## 2017-12-26 MED ORDER — METFORMIN HCL 500 MG PO TABS: ORAL_TABLET | ORAL | 3 refills | Status: DC

## 2017-12-26 MED ORDER — GABAPENTIN 800 MG PO TABS: ORAL_TABLET | ORAL | 3 refills | Status: DC

## 2017-12-26 MED ORDER — DULOXETINE HCL 60 MG PO CPEP
ORAL_CAPSULE | ORAL | 3 refills | Status: DC
Start: 2017-12-26 — End: 2019-01-18

## 2017-12-26 MED ORDER — CETIRIZINE HCL 10 MG PO CHEW: CHEWABLE_TABLET | ORAL | 0 refills | Status: DC

## 2017-12-26 MED ORDER — OMEPRAZOLE 20 MG PO CPDR
DELAYED_RELEASE_CAPSULE | ORAL | 3 refills | Status: DC
Start: 2017-12-26 — End: 2018-06-20

## 2017-12-26 MED ORDER — PROPRANOLOL HCL 80 MG PO TABS: ORAL_TABLET | ORAL | 3 refills | Status: DC

## 2017-12-26 NOTE — Assessment & Plan Note (Signed)
Alert and oriented, participating in HPI. Continue donepezil and neurology follow up.

## 2017-12-26 NOTE — Assessment & Plan Note (Signed)
Following with neurology and overall doing well on current regimen.

## 2017-12-26 NOTE — Assessment & Plan Note (Signed)
A1C and urine microalbumin pending. Continue Metformin. Continue statin.  She will schedule her eye exam for this Fall. Foot exam next visit.  Follow up in 6 months.

## 2017-12-26 NOTE — Assessment & Plan Note (Signed)
No recent oncology follow up, strongly advised this. Mammogram in May 2019 negative for malignancy. Repeat in 1 year.

## 2017-12-26 NOTE — Progress Notes (Signed)
Subjective:    Patient ID: Shannon Higgins. Damas, female    DOB: May 17, 1934, 83 y.o.   MRN: [id number]  HPI  Shannon Higgins is an 83 year old female who presents today for follow up.  1) Essential Hypertension: Currently managed on propranolol 80 mg daily. She denies chest pain, dizziness, weakness.   BP Readings from Last 3 Encounters:  12/26/17 122/70  11/20/17 124/80  09/06/17 124/74   2) OSA: Diagnosed years ago. Is not currently using a CPAP machine as she cannot tolerate.   3) Hypothyroidism: Currently managed on levothyroxine 112 mcg tablets which was initiated in May 2019 due to TSH of 0.19. Patient and her daughter were notified and instructed to return 6 weeks later for repeat TSH, this was not completed.   She is taking her levothyroxine whenever she remembers as she's been traveling. Was once taking it every morning with water and without food or other medications for 30 minutes. She plans on resuming this regimen.  4) Hyperlipidemia/CVA: Currently managed on pravastatin 40 mg. Her last lipid panel was in February 2019 with LDL of 121. History of CVA x 3. She stopped taking her pravastatin several years ago and is not sure if she's been taking.  5) Type 2 Diabetes:  Current medications include: Metformin 500 mg BID. Also managed on gabapentin 800 mg TID for diabetic neuropathy and feels well managed.   Last A1C: 7.6 in February 2019 Last Eye Exam: Due in September  ACE/ARB: Urine microalbumin pending Statin: Pravastatin   6) Breast Cancer: Diagnosed in 2015. Currently managed on anastrozole 1 mg daily. Her last mammogram was in May 2019, no evidence of malignancy. She has missed two prior appointments with her oncologist and plans on scheduling an appointment soon.   7) Major Depressive Disorder: Currently managed on Cymbalta 60 mg. Previously managed on Trazodone 50 mg, has been taking up to 200 mg HS and doesn't feel as though it's helping. She has difficulty staying asleep  most of the time but has been sleeping okay throughout the last several nights. She found no improvement with Melatonin. She is not taking Trazodone.   8) Rheumatoid Arthritis/Osteoarthritis: Diagnosed around 2015. Currently taking Meloxciam 15 mg once nightly.   9) Migraines: Currently managed on Botox, Imitrex. Following with Neurology and feels well managed. Also managed on donepezil 10 mg per Neurology. She has a follow up appointment for her migraines later this month and also a follow up for dementia in October 2019.  Review of Systems  Constitutional: Negative for unexpected weight change.  HENT: Negative for rhinorrhea.   Respiratory: Negative for cough and shortness of breath.   Cardiovascular: Negative for chest pain.  Gastrointestinal: Negative for constipation and diarrhea.  Genitourinary: Positive for frequency. Negative for difficulty urinating.       Wants to ensure her UTI from July is gone.  Musculoskeletal: Positive for arthralgias.  Skin: Negative for rash.  Allergic/Immunologic: Negative for environmental allergies.  Neurological: Negative for dizziness.       Migraine yesterday, no headaches or migraines for numerous months prior. Diabetic neuropathy to feet.  Psychiatric/Behavioral:       Feels well managed on Cymbalta       Past Medical History:  Diagnosis Date  . Asthma   . Breast cancer ([place])   . Chronic sinusitis   . Diabetes mellitus without complication ([place])   . GERD (gastroesophageal reflux disease)   . History of recurrent UTIs   . Hypertension   .  Insomnia   . Migraine   . Neuropathic pain   . Pneumonia   . Rheumatoid arthritis ([place])   . Sleep apnea   . Stroke ([place])   . Thyroid disease      Social History   Socioeconomic History  . Marital status: Widowed    Spouse name: Not on file  . Number of children: Not on file  . Years of education: Not on file  . Highest education level: Not on file  Occupational History  . Not on file    Social Needs  . Financial resource strain: Not on file  . Food insecurity:    Worry: Not on file    Inability: Not on file  . Transportation needs:    Medical: Not on file    Non-medical: Not on file  Tobacco Use  . Smoking status: Never Smoker  . Smokeless tobacco: Never Used  Substance and Sexual Activity  . Alcohol use: No  . Drug use: No  . Sexual activity: Not on file  Lifestyle  . Physical activity:    Days per week: Not on file    Minutes per session: Not on file  . Stress: Not on file  Relationships  . Social connections:    Talks on phone: Not on file    Gets together: Not on file    Attends religious service: Not on file    Active member of club or organization: Not on file    Attends meetings of clubs or organizations: Not on file    Relationship status: Not on file  . Intimate partner violence:    Fear of current or ex partner: Not on file    Emotionally abused: Not on file    Physically abused: Not on file    Forced sexual activity: Not on file  Other Topics Concern  . Not on file  Social History Narrative   Pt lives in 1 story home with her daughter, Shannon Higgins and [name]'s husband   Has 2 adult daughters   Highest level of education: some college   Worked mainly as Web designer.    Past Surgical History:  Procedure Laterality Date  . ABDOMINAL HYSTERECTOMY    . BACK SURGERY    . BREAST LUMPECTOMY    . CHOLECYSTECTOMY    . KNEE SURGERY    . SHOULDER SURGERY      Family History  Problem Relation Age of Onset  . Diabetes Daughter   . Hypertension Daughter   . Fibromyalgia Daughter   . GER disease Daughter   . Fibromyalgia Daughter   . Crohn's disease Daughter   . Asthma Daughter   . Hypertension Mother     No Known Allergies  Current Outpatient Medications on File Prior to Visit  Medication Sig Dispense Refill  . anastrozole (ARIMIDEX) 1 MG tablet Take 1 mg by mouth daily.    . blood glucose meter kit and supplies Dispense based on  patient and insurance preference. Use up to 2 times daily as directed. (FOR ICD-10 E10.9, E11.9). 1 each 0  . brimonidine (ALPHAGAN) 0.2 % ophthalmic solution Place 1 drop into both eyes 3 (three) times daily.    [name] donepezil (ARICEPT) 10 MG tablet Take 1/2 tablet daily for a month, then increase to 1 tablet daily 30 tablet 11  . fluticasone (FLONASE) 50 MCG/ACT nasal spray Place 2 sprays into both nostrils daily. 48 g 3  . Fremanezumab-vfrm (AJOVY) 225 MG/1.5ML SOSY Inject 1 Units into the skin  every 30 (thirty) days. 1 Syringe 11  . latanoprost (XALATAN) 0.005 % ophthalmic solution Place 1 drop into both eyes at bedtime.    [name] levothyroxine (SYNTHROID) 112 MCG tablet Take 1 tablet by mouth every morning on an empty stomach with a full glass of water. No food or other medications for 30 minutes. 90 tablet 0  . meclizine (ANTIVERT) 25 MG tablet Take 1 tablet (25 mg total) by mouth 2 (two) times daily as needed for dizziness. 180 tablet 0  . SUMAtriptan (IMITREX) 25 MG tablet Take as needed for migraine. Do not take more than twice a week 8 tablet 5  . traZODone (DESYREL) 100 MG tablet Take 1-2 tablets by mouth at bedtime as needed for sleep. 180 tablet 0  . amoxicillin (AMOXIL) 500 MG capsule Take 4 capsules by mouth one hour prior to appiontment     No current facility-administered medications on file prior to visit.     BP 122/70   Pulse 64   Temp 98.2 F (36.8 C) (Oral)   Ht 5' 3.5" (1.613 m)   Wt 167 lb 8 oz (76 kg)   SpO2 95%   BMI 29.21 kg/m    Objective:   Physical Exam  Constitutional: She is oriented to person, place, and time. She appears well-nourished.  HENT:  Mouth/Throat: No oropharyngeal exudate.  Eyes: Pupils are equal, round, and reactive to light. EOM are normal.  Neck: Neck supple.  Cardiovascular: Normal rate and regular rhythm.  Respiratory: Effort normal and breath sounds normal.  GI: Soft. Bowel sounds are normal. There is no tenderness.  Musculoskeletal:  Normal range of motion.  Grossly normal ROM to extremities and trunk  Neurological: She is alert and oriented to person, place, and time.  Skin: Skin is warm and dry.  Psychiatric: She has a normal mood and affect.           Assessment & Plan:  Urinary Frequency:  Intermittent. UA today without evidence of infection. Culture sent.  Pleas Koch, NP

## 2017-12-26 NOTE — Assessment & Plan Note (Signed)
Has not slept with CPAP in years as she cannot tolerate.

## 2017-12-26 NOTE — Assessment & Plan Note (Signed)
No repeat labs as recommended since reduction in levothyroxine to 112 mcg. Repeat TSH pending. Discussed proper instructions for taking levothyroxine. Will send refills once labs return.

## 2017-12-26 NOTE — Assessment & Plan Note (Signed)
Stable in the office today, continue propranolol.

## 2017-12-26 NOTE — Assessment & Plan Note (Signed)
Repeat lipids pending, refills sent for pravastatin.

## 2017-12-26 NOTE — Assessment & Plan Note (Signed)
Doing well on gabapentin 800 mg TID, continue same.

## 2017-12-26 NOTE — Patient Instructions (Signed)
Stop by the lab prior to leaving today. I will notify you of your results once received.   I will send your levothyroxine once I receive your thyroid function testing.  Please schedule an appointment with your oncologist as discussed.   Follow up with neurology as scheduled.  Please schedule a follow up appointment in 6 months for diabetes check.   It was a pleasure to see you today!

## 2017-12-26 NOTE — Assessment & Plan Note (Signed)
Feels well managed on Cymbalta, continue same. No longer using Trazodone for insomnia, will discontinue.

## 2017-12-26 NOTE — Assessment & Plan Note (Signed)
Doing well on omeprazole 20 mg BID. Continue same.

## 2017-12-27 ENCOUNTER — Other Ambulatory Visit: Payer: Self-pay | Admitting: Primary Care

## 2017-12-27 DIAGNOSIS — E039 Hypothyroidism, unspecified: Secondary | ICD-10-CM

## 2017-12-27 DIAGNOSIS — E785 Hyperlipidemia, unspecified: Secondary | ICD-10-CM

## 2017-12-27 LAB — URINE CULTURE
MICRO NUMBER: 91023365
Result:: NO GROWTH
SPECIMEN QUALITY:: ADEQUATE

## 2017-12-27 LAB — HEMOGLOBIN A1C: Hgb A1c MFr Bld: 7.4 % — ABNORMAL HIGH (ref 4.6–6.5)

## 2017-12-27 LAB — HEPATIC FUNCTION PANEL
ALBUMIN: 4.6 g/dL (ref 3.5–5.2)
ALT: 16 U/L (ref 0–35)
AST: 19 U/L (ref 0–37)
Alkaline Phosphatase: 98 U/L (ref 39–117)
Bilirubin, Direct: 0 mg/dL (ref 0.0–0.3)
Total Bilirubin: 0.4 mg/dL (ref 0.2–1.2)
Total Protein: 8 g/dL (ref 6.0–8.3)

## 2017-12-27 LAB — LIPID PANEL
CHOL/HDL RATIO: 6
Cholesterol: 290 mg/dL — ABNORMAL HIGH (ref 0–200)
HDL: 51.4 mg/dL (ref >39.00)
NONHDL: 238.33
Triglycerides: 255 mg/dL — ABNORMAL HIGH (ref 0.0–149.0)
VLDL: 51 mg/dL — AB (ref 0.0–40.0)

## 2017-12-27 LAB — LDL CHOLESTEROL, DIRECT: LDL DIRECT: 207 mg/dL

## 2017-12-27 LAB — TSH: TSH: 14.11 u[IU]/mL — AB (ref 0.35–4.50)

## 2017-12-27 MED ORDER — ROSUVASTATIN CALCIUM 10 MG PO TABS: ORAL_TABLET | ORAL | 3 refills | Status: DC

## 2017-12-27 MED ORDER — LEVOTHYROXINE SODIUM 112 MCG PO TABS: ORAL_TABLET | ORAL | 0 refills | Status: DC

## 2017-12-29 ENCOUNTER — Other Ambulatory Visit (INDEPENDENT_AMBULATORY_CARE_PROVIDER_SITE_OTHER): Payer: Medicare HMO

## 2017-12-29 ENCOUNTER — Ambulatory Visit (INDEPENDENT_AMBULATORY_CARE_PROVIDER_SITE_OTHER): Payer: Medicare HMO | Admitting: Neurology

## 2017-12-29 DIAGNOSIS — G43709 Chronic migraine without aura, not intractable, without status migrainosus: Secondary | ICD-10-CM | POA: Diagnosis not present

## 2017-12-29 DIAGNOSIS — E118 Type 2 diabetes mellitus with unspecified complications: Secondary | ICD-10-CM | POA: Diagnosis not present

## 2017-12-29 DIAGNOSIS — R350 Frequency of micturition: Secondary | ICD-10-CM | POA: Diagnosis not present

## 2017-12-29 LAB — MICROALBUMIN / CREATININE URINE RATIO
Creatinine,U: 65.8 mg/dL
Microalb Creat Ratio: 19.5 mg/g (ref 0.0–30.0)
Microalb, Ur: 12.8 mg/dL — ABNORMAL HIGH (ref 0.0–1.9)

## 2017-12-29 MED ORDER — ONABOTULINUMTOXINA 100 UNITS IJ SOLR
155.0000 [IU] | Freq: Once | INTRAMUSCULAR | Status: AC
  Administered 2017-12-29: 155 [IU] via INTRAMUSCULAR

## 2017-12-29 NOTE — Progress Notes (Signed)
[facility]   Procedure Note Botox  Attending: Dr. Metta Clines  Preoperative Diagnosis(es): Chronic migraine  Consent obtained from: The patient Benefits discussed included, but were not limited to decreased muscle tightness, increased joint range of motion, and decreased pain.  Risk discussed included, but were not limited pain and discomfort, bleeding, bruising, excessive weakness, venous thrombosis, muscle atrophy and dysphagia.  Anticipated outcomes of the procedure as well as he risks and benefits of the alternatives to the procedure, and the roles and tasks of the personnel to be involved, were discussed with the patient, and the patient consents to the procedure and agrees to proceed. A copy of the patient medication guide was given to the patient which explains the blackbox warning.  Patients identity and treatment sites confirmed Yes.  .  Details of Procedure: Skin was cleaned with alcohol. Prior to injection, the needle plunger was aspirated to make sure the needle was not within a blood vessel.  There was no blood retrieved on aspiration.    Following is a summary of the muscles injected  And the amount of Botulinum toxin used:  Dilution 200 units of Botox was reconstituted with 4 ml of preservative free normal saline. Time of reconstitution: At the time of the office visit (<30 minutes prior to injection)   Injections  155 total units of Botox was injected with a 30 gauge needle.  Injection Sites: L occipitalis: 15 units- 3 sites  R occiptalis: 15 units- 3 sites  L upper trapezius: 15 units- 3 sites R upper trapezius: 15 units- 3 sits          L paraspinal: 10 units- 2 sites R paraspinal: 10 units- 2 sites  Face L frontalis(2 injection sites):10 units   R frontalis(2 injection sites):10 units         L corrugator: 5 units   R corrugator: 5 units           Procerus: 5 units   L temporalis: 20 units R temporalis: 20 units   Agent:  200 units of botulinum Type  A (Onobotulinum Toxin type A) was reconstituted with 4 ml of preservative free normal saline.  Time of reconstitution: At the time of the office visit (<30 minutes prior to injection)     Total injected (Units): 155  Total wasted (Units): 11.25  Patient tolerated procedure well without complications.   Reinjection is anticipated in 3 months.

## 2018-01-02 DIAGNOSIS — H401133 Primary open-angle glaucoma, bilateral, severe stage: Secondary | ICD-10-CM | POA: Diagnosis not present

## 2018-01-05 ENCOUNTER — Telehealth: Payer: Self-pay | Admitting: Primary Care

## 2018-01-05 NOTE — Telephone Encounter (Unsigned)
Copied from [place] [id number]. Topic: Quick Communication - See Telephone Encounter >> Jan 05, 2018  3:09 PM Hewitt Shorts wrote: Mcarthur Rossetti is calling to get a refill on pt true plue lancets 33guage    Best number for Mcarthur Rossetti is [phone]

## 2018-01-06 ENCOUNTER — Other Ambulatory Visit: Payer: Self-pay | Admitting: Primary Care

## 2018-01-06 DIAGNOSIS — G4700 Insomnia, unspecified: Secondary | ICD-10-CM

## 2018-01-08 ENCOUNTER — Encounter: Payer: Self-pay | Admitting: Primary Care

## 2018-01-08 ENCOUNTER — Encounter: Payer: Self-pay | Admitting: *Deleted

## 2018-01-08 NOTE — Telephone Encounter (Signed)
Patient is no longer taking. Denied refill request.

## 2018-01-08 NOTE — Telephone Encounter (Signed)
Electronic refill request Last office visit 12/26/17 Medication no longer on list Discontinued 11/27/17- note shows dose change

## 2018-01-09 ENCOUNTER — Telehealth: Payer: Self-pay | Admitting: Primary Care

## 2018-01-09 NOTE — Telephone Encounter (Signed)
Copied from [place] [id number]. Topic: Quick Communication - See Telephone Encounter >> Jan 09, 2018  4:27 PM Conception Chancy, NT wrote: CRM for notification. See Telephone encounter for: 01/09/18.  Ardie is calling from [facility] and is needing to clarify what the patient is taking. He states he received a prescription for Pravastatin 40mg  on 12/26/17 and then on 12/27/17 received rosuvastatin (CRESTOR) 10 MG tablet. He would like to clarify which one the patient is taking.   Cb# [phone] Fax# [phone]

## 2018-01-10 NOTE — Telephone Encounter (Signed)
Spoken to Shannon Higgins and notified them that patient is supposed to be taking rosuvastatin. I had then cancel the pravastatin.

## 2018-01-18 DIAGNOSIS — M170 Bilateral primary osteoarthritis of knee: Secondary | ICD-10-CM | POA: Diagnosis not present

## 2018-01-18 DIAGNOSIS — M25561 Pain in right knee: Secondary | ICD-10-CM | POA: Diagnosis not present

## 2018-01-19 DIAGNOSIS — M170 Bilateral primary osteoarthritis of knee: Secondary | ICD-10-CM | POA: Diagnosis not present

## 2018-01-19 DIAGNOSIS — M25512 Pain in left shoulder: Secondary | ICD-10-CM | POA: Diagnosis not present

## 2018-01-19 DIAGNOSIS — Z8669 Personal history of other diseases of the nervous system and sense organs: Secondary | ICD-10-CM | POA: Diagnosis not present

## 2018-01-19 DIAGNOSIS — G629 Polyneuropathy, unspecified: Secondary | ICD-10-CM | POA: Diagnosis not present

## 2018-01-19 DIAGNOSIS — E119 Type 2 diabetes mellitus without complications: Secondary | ICD-10-CM | POA: Diagnosis not present

## 2018-01-19 DIAGNOSIS — R2689 Other abnormalities of gait and mobility: Secondary | ICD-10-CM | POA: Diagnosis not present

## 2018-01-19 DIAGNOSIS — Z9181 History of falling: Secondary | ICD-10-CM | POA: Diagnosis not present

## 2018-01-19 DIAGNOSIS — Z8673 Personal history of transient ischemic attack (TIA), and cerebral infarction without residual deficits: Secondary | ICD-10-CM | POA: Diagnosis not present

## 2018-01-19 DIAGNOSIS — M5136 Other intervertebral disc degeneration, lumbar region: Secondary | ICD-10-CM | POA: Diagnosis not present

## 2018-01-23 DIAGNOSIS — Z9181 History of falling: Secondary | ICD-10-CM | POA: Diagnosis not present

## 2018-01-23 DIAGNOSIS — Z8673 Personal history of transient ischemic attack (TIA), and cerebral infarction without residual deficits: Secondary | ICD-10-CM | POA: Diagnosis not present

## 2018-01-23 DIAGNOSIS — G629 Polyneuropathy, unspecified: Secondary | ICD-10-CM | POA: Diagnosis not present

## 2018-01-23 DIAGNOSIS — M170 Bilateral primary osteoarthritis of knee: Secondary | ICD-10-CM | POA: Diagnosis not present

## 2018-01-23 DIAGNOSIS — Z8669 Personal history of other diseases of the nervous system and sense organs: Secondary | ICD-10-CM | POA: Diagnosis not present

## 2018-01-23 DIAGNOSIS — M5136 Other intervertebral disc degeneration, lumbar region: Secondary | ICD-10-CM | POA: Diagnosis not present

## 2018-01-23 DIAGNOSIS — E119 Type 2 diabetes mellitus without complications: Secondary | ICD-10-CM | POA: Diagnosis not present

## 2018-01-23 DIAGNOSIS — M25512 Pain in left shoulder: Secondary | ICD-10-CM | POA: Diagnosis not present

## 2018-01-23 DIAGNOSIS — R2689 Other abnormalities of gait and mobility: Secondary | ICD-10-CM | POA: Diagnosis not present

## 2018-01-25 DIAGNOSIS — M25512 Pain in left shoulder: Secondary | ICD-10-CM | POA: Diagnosis not present

## 2018-01-25 DIAGNOSIS — G629 Polyneuropathy, unspecified: Secondary | ICD-10-CM | POA: Diagnosis not present

## 2018-01-25 DIAGNOSIS — R2689 Other abnormalities of gait and mobility: Secondary | ICD-10-CM | POA: Diagnosis not present

## 2018-01-25 DIAGNOSIS — Z9181 History of falling: Secondary | ICD-10-CM | POA: Diagnosis not present

## 2018-01-25 DIAGNOSIS — M170 Bilateral primary osteoarthritis of knee: Secondary | ICD-10-CM | POA: Diagnosis not present

## 2018-01-25 DIAGNOSIS — Z8669 Personal history of other diseases of the nervous system and sense organs: Secondary | ICD-10-CM | POA: Diagnosis not present

## 2018-01-25 DIAGNOSIS — Z8673 Personal history of transient ischemic attack (TIA), and cerebral infarction without residual deficits: Secondary | ICD-10-CM | POA: Diagnosis not present

## 2018-01-25 DIAGNOSIS — E119 Type 2 diabetes mellitus without complications: Secondary | ICD-10-CM | POA: Diagnosis not present

## 2018-01-25 DIAGNOSIS — M5136 Other intervertebral disc degeneration, lumbar region: Secondary | ICD-10-CM | POA: Diagnosis not present

## 2018-01-29 ENCOUNTER — Ambulatory Visit (INDEPENDENT_AMBULATORY_CARE_PROVIDER_SITE_OTHER): Payer: Medicare HMO | Admitting: Primary Care

## 2018-01-29 VITALS — BP 124/74 | HR 64 | Temp 98.0°F | Ht 63.5 in | Wt 166.2 lb

## 2018-01-29 DIAGNOSIS — E785 Hyperlipidemia, unspecified: Secondary | ICD-10-CM | POA: Diagnosis not present

## 2018-01-29 DIAGNOSIS — R350 Frequency of micturition: Secondary | ICD-10-CM

## 2018-01-29 DIAGNOSIS — E039 Hypothyroidism, unspecified: Secondary | ICD-10-CM

## 2018-01-29 DIAGNOSIS — Z23 Encounter for immunization: Secondary | ICD-10-CM | POA: Diagnosis not present

## 2018-01-29 LAB — POC URINALSYSI DIPSTICK (AUTOMATED)
Blood, UA: NEGATIVE
Glucose, UA: NEGATIVE
KETONES UA: NEGATIVE
Leukocytes, UA: NEGATIVE
Nitrite, UA: NEGATIVE
PH UA: 6 (ref 5.0–8.0)
PROTEIN UA: POSITIVE — AB
SPEC GRAV UA: 1.025 (ref 1.010–1.025)
Urobilinogen, UA: 0.2 E.U./dL

## 2018-01-29 LAB — TSH: TSH: 1.37 u[IU]/mL (ref 0.35–4.50)

## 2018-01-29 NOTE — Patient Instructions (Signed)
We will be in touch once we receive your urine culture results.  Stop by the lab prior to leaving today. I will notify you of your results once received.   Try ice/heat, stretching to the lower back. Continue with physical therapy.  It was a pleasure to see you today!

## 2018-01-29 NOTE — Progress Notes (Signed)
Subjective:    Patient ID: Shannon Higgins. Brix, female    DOB: October 08, 1934, 83 y.o.   MRN: [id number]  HPI  Shannon Higgins is an 83 year old female with a history of breast cancer, type 2 diabetes, hypothyroidism, chronic lower back pain, acute cystitis who presents today with a chief complaint of urinary frequency.  She also reports left lower back pain. She has been working with physical therapy twice weekly for chronic back pain and believes some of her symptoms may be from that. She denies dysuria, hematuria, fevers, abdominal pain.    Review of Systems  Constitutional: Negative for fever.  Genitourinary: Positive for frequency. Negative for difficulty urinating, dysuria, hematuria and vaginal discharge.  Musculoskeletal: Positive for back pain.  Skin: Negative for color change.       Past Medical History:  Diagnosis Date  . Asthma   . Breast cancer ([place])   . Chronic sinusitis   . Diabetes mellitus without complication ([place])   . GERD (gastroesophageal reflux disease)   . History of recurrent UTIs   . Hypertension   . Insomnia   . Migraine   . Neuropathic pain   . Pneumonia   . Rheumatoid arthritis ([place])   . Sleep apnea   . Stroke ([place])   . Thyroid disease      Social History   Socioeconomic History  . Marital status: Widowed    Spouse name: Not on file  . Number of children: Not on file  . Years of education: Not on file  . Highest education level: Not on file  Occupational History  . Not on file  Social Needs  . Financial resource strain: Not on file  . Food insecurity:    Worry: Not on file    Inability: Not on file  . Transportation needs:    Medical: Not on file    Non-medical: Not on file  Tobacco Use  . Smoking status: Never Smoker  . Smokeless tobacco: Never Used  Substance and Sexual Activity  . Alcohol use: No  . Drug use: No  . Sexual activity: Not on file  Lifestyle  . Physical activity:    Days per week: Not on file    Minutes per session: Not  on file  . Stress: Not on file  Relationships  . Social connections:    Talks on phone: Not on file    Gets together: Not on file    Attends religious service: Not on file    Active member of club or organization: Not on file    Attends meetings of clubs or organizations: Not on file    Relationship status: Not on file  . Intimate partner violence:    Fear of current or ex partner: Not on file    Emotionally abused: Not on file    Physically abused: Not on file    Forced sexual activity: Not on file  Other Topics Concern  . Not on file  Social History Narrative   Pt lives in 1 story home with her daughter, Shannon Higgins and [name]'s husband   Has 2 adult daughters   Highest level of education: some college   Worked mainly as Web designer.    Past Surgical History:  Procedure Laterality Date  . ABDOMINAL HYSTERECTOMY    . BACK SURGERY    . BREAST LUMPECTOMY    . CHOLECYSTECTOMY    . KNEE SURGERY    . SHOULDER SURGERY      Family History  Problem Relation Age of Onset  . Diabetes Daughter   . Hypertension Daughter   . Fibromyalgia Daughter   . GER disease Daughter   . Fibromyalgia Daughter   . Crohn's disease Daughter   . Asthma Daughter   . Hypertension Mother     No Known Allergies  Current Outpatient Medications on File Prior to Visit  Medication Sig Dispense Refill  . amoxicillin (AMOXIL) 500 MG capsule Take 4 capsules by mouth one hour prior to appiontment    . anastrozole (ARIMIDEX) 1 MG tablet Take 1 mg by mouth daily.    . blood glucose meter kit and supplies Dispense based on patient and insurance preference. Use up to 2 times daily as directed. (FOR ICD-10 E10.9, E11.9). 1 each 0  . cetirizine (ZYRTEC) 10 MG chewable tablet Chew 1 tablet by mouth once daily as needed for allergies. 90 tablet 0  . donepezil (ARICEPT) 10 MG tablet Take 1/2 tablet daily for a month, then increase to 1 tablet daily 30 tablet 11  . DULoxetine (CYMBALTA) 60 MG capsule Take 1  capsule by mouth once daily for depression. 90 capsule 3  . fluticasone (FLONASE) 50 MCG/ACT nasal spray Place 2 sprays into both nostrils daily. 48 g 3  . gabapentin (NEURONTIN) 800 MG tablet Take 1 capsule by mouth three times daily for neuropathy. 270 tablet 3  . latanoprost (XALATAN) 0.005 % ophthalmic solution Place 1 drop into both eyes at bedtime.    [name] levothyroxine (SYNTHROID) 112 MCG tablet Take 1 tablet by mouth every morning on an empty stomach with a full glass of water. No food or other medications for 30 minutes. 90 tablet 0  . meclizine (ANTIVERT) 25 MG tablet Take 1 tablet (25 mg total) by mouth 2 (two) times daily as needed for dizziness. 180 tablet 0  . meloxicam (MOBIC) 15 MG tablet Take 1 tablet by mouth once daily as needed for pain. 90 tablet 0  . metFORMIN (GLUCOPHAGE) 500 MG tablet Take 1 tablet by mouth twice daily with food for diabetes. 180 tablet 3  . Netarsudil Dimesylate (RHOPRESSA) 0.02 % SOLN Apply 1 drop to eye at bedtime.    [name] omeprazole (PRILOSEC) 20 MG capsule Take 1 capsule by mouth twice daily for heartburn. 180 capsule 3  . propranolol (INDERAL) 80 MG tablet Take 1 tablet by mouth once daily for blood pressure. 90 tablet 3  . rosuvastatin (CRESTOR) 10 MG tablet Take 1 tablet by mouth once daily for cholesterol. 90 tablet 3  . SUMAtriptan (IMITREX) 25 MG tablet Take as needed for migraine. Do not take more than twice a week 8 tablet 5   No current facility-administered medications on file prior to visit.     BP 124/74   Pulse 64   Temp 98 F (36.7 C) (Oral)   Ht 5' 3.5" (1.613 m)   Wt 166 lb 4 oz (75.4 kg)   SpO2 94%   BMI 28.99 kg/m    Objective:   Physical Exam  Constitutional: She is oriented to person, place, and time. She appears well-nourished.  Neck: Neck supple.  Cardiovascular: Normal rate and regular rhythm.  Respiratory: Effort normal and breath sounds normal.  GI: There is no CVA tenderness.  Musculoskeletal:       Back:  MSK  tenderness upon palpation   Neurological: She is alert and oriented to person, place, and time.  Skin: Skin is warm and dry.           Assessment &  Plan:  Low Back Pain:  Also with urinary frequency. Physical exam today suggestive of MSK cause for back pain. UA is without leukocytes, blood, nitrites. Culture sent given history. Discussed to limit caffeine and bladder irritants.  Consider potential cystocele causing frequency. Continue with PT for back pain, discussed use of heat/ice, stretching.  Pleas Koch, NP

## 2018-01-30 LAB — URINE CULTURE
MICRO NUMBER: 91171278
SPECIMEN QUALITY: ADEQUATE

## 2018-01-30 NOTE — Addendum Note (Signed)
Addended by: Jacqualin Combes on: 01/30/2018 08:11 AM   Modules accepted: Orders

## 2018-01-31 DIAGNOSIS — Z8669 Personal history of other diseases of the nervous system and sense organs: Secondary | ICD-10-CM | POA: Diagnosis not present

## 2018-01-31 DIAGNOSIS — M170 Bilateral primary osteoarthritis of knee: Secondary | ICD-10-CM | POA: Diagnosis not present

## 2018-01-31 DIAGNOSIS — E119 Type 2 diabetes mellitus without complications: Secondary | ICD-10-CM | POA: Diagnosis not present

## 2018-01-31 DIAGNOSIS — M25512 Pain in left shoulder: Secondary | ICD-10-CM | POA: Diagnosis not present

## 2018-01-31 DIAGNOSIS — Z9181 History of falling: Secondary | ICD-10-CM | POA: Diagnosis not present

## 2018-01-31 DIAGNOSIS — Z8673 Personal history of transient ischemic attack (TIA), and cerebral infarction without residual deficits: Secondary | ICD-10-CM | POA: Diagnosis not present

## 2018-01-31 DIAGNOSIS — G629 Polyneuropathy, unspecified: Secondary | ICD-10-CM | POA: Diagnosis not present

## 2018-01-31 DIAGNOSIS — M5136 Other intervertebral disc degeneration, lumbar region: Secondary | ICD-10-CM | POA: Diagnosis not present

## 2018-01-31 DIAGNOSIS — R2689 Other abnormalities of gait and mobility: Secondary | ICD-10-CM | POA: Diagnosis not present

## 2018-02-05 DIAGNOSIS — M170 Bilateral primary osteoarthritis of knee: Secondary | ICD-10-CM | POA: Diagnosis not present

## 2018-02-05 DIAGNOSIS — R2689 Other abnormalities of gait and mobility: Secondary | ICD-10-CM | POA: Diagnosis not present

## 2018-02-05 DIAGNOSIS — G629 Polyneuropathy, unspecified: Secondary | ICD-10-CM | POA: Diagnosis not present

## 2018-02-05 DIAGNOSIS — M5136 Other intervertebral disc degeneration, lumbar region: Secondary | ICD-10-CM | POA: Diagnosis not present

## 2018-02-05 DIAGNOSIS — Z8673 Personal history of transient ischemic attack (TIA), and cerebral infarction without residual deficits: Secondary | ICD-10-CM | POA: Diagnosis not present

## 2018-02-05 DIAGNOSIS — M25512 Pain in left shoulder: Secondary | ICD-10-CM | POA: Diagnosis not present

## 2018-02-05 DIAGNOSIS — Z9181 History of falling: Secondary | ICD-10-CM | POA: Diagnosis not present

## 2018-02-05 DIAGNOSIS — Z8669 Personal history of other diseases of the nervous system and sense organs: Secondary | ICD-10-CM | POA: Diagnosis not present

## 2018-02-05 DIAGNOSIS — E119 Type 2 diabetes mellitus without complications: Secondary | ICD-10-CM | POA: Diagnosis not present

## 2018-02-07 DIAGNOSIS — Z9181 History of falling: Secondary | ICD-10-CM | POA: Diagnosis not present

## 2018-02-07 DIAGNOSIS — G629 Polyneuropathy, unspecified: Secondary | ICD-10-CM | POA: Diagnosis not present

## 2018-02-07 DIAGNOSIS — M25512 Pain in left shoulder: Secondary | ICD-10-CM | POA: Diagnosis not present

## 2018-02-07 DIAGNOSIS — Z8673 Personal history of transient ischemic attack (TIA), and cerebral infarction without residual deficits: Secondary | ICD-10-CM | POA: Diagnosis not present

## 2018-02-07 DIAGNOSIS — Z8669 Personal history of other diseases of the nervous system and sense organs: Secondary | ICD-10-CM | POA: Diagnosis not present

## 2018-02-07 DIAGNOSIS — M5136 Other intervertebral disc degeneration, lumbar region: Secondary | ICD-10-CM | POA: Diagnosis not present

## 2018-02-07 DIAGNOSIS — R2689 Other abnormalities of gait and mobility: Secondary | ICD-10-CM | POA: Diagnosis not present

## 2018-02-07 DIAGNOSIS — M170 Bilateral primary osteoarthritis of knee: Secondary | ICD-10-CM | POA: Diagnosis not present

## 2018-02-07 DIAGNOSIS — E119 Type 2 diabetes mellitus without complications: Secondary | ICD-10-CM | POA: Diagnosis not present

## 2018-02-13 DIAGNOSIS — M170 Bilateral primary osteoarthritis of knee: Secondary | ICD-10-CM | POA: Diagnosis not present

## 2018-02-13 DIAGNOSIS — M25512 Pain in left shoulder: Secondary | ICD-10-CM | POA: Diagnosis not present

## 2018-02-13 DIAGNOSIS — R2689 Other abnormalities of gait and mobility: Secondary | ICD-10-CM | POA: Diagnosis not present

## 2018-02-13 DIAGNOSIS — M5136 Other intervertebral disc degeneration, lumbar region: Secondary | ICD-10-CM | POA: Diagnosis not present

## 2018-02-13 DIAGNOSIS — Z8669 Personal history of other diseases of the nervous system and sense organs: Secondary | ICD-10-CM | POA: Diagnosis not present

## 2018-02-13 DIAGNOSIS — Z9181 History of falling: Secondary | ICD-10-CM | POA: Diagnosis not present

## 2018-02-13 DIAGNOSIS — G629 Polyneuropathy, unspecified: Secondary | ICD-10-CM | POA: Diagnosis not present

## 2018-02-13 DIAGNOSIS — E119 Type 2 diabetes mellitus without complications: Secondary | ICD-10-CM | POA: Diagnosis not present

## 2018-02-13 DIAGNOSIS — Z8673 Personal history of transient ischemic attack (TIA), and cerebral infarction without residual deficits: Secondary | ICD-10-CM | POA: Diagnosis not present

## 2018-02-15 DIAGNOSIS — M25512 Pain in left shoulder: Secondary | ICD-10-CM | POA: Diagnosis not present

## 2018-02-15 DIAGNOSIS — E119 Type 2 diabetes mellitus without complications: Secondary | ICD-10-CM | POA: Diagnosis not present

## 2018-02-15 DIAGNOSIS — Z8669 Personal history of other diseases of the nervous system and sense organs: Secondary | ICD-10-CM | POA: Diagnosis not present

## 2018-02-15 DIAGNOSIS — M170 Bilateral primary osteoarthritis of knee: Secondary | ICD-10-CM | POA: Diagnosis not present

## 2018-02-15 DIAGNOSIS — M5136 Other intervertebral disc degeneration, lumbar region: Secondary | ICD-10-CM | POA: Diagnosis not present

## 2018-02-15 DIAGNOSIS — Z9181 History of falling: Secondary | ICD-10-CM | POA: Diagnosis not present

## 2018-02-15 DIAGNOSIS — G629 Polyneuropathy, unspecified: Secondary | ICD-10-CM | POA: Diagnosis not present

## 2018-02-15 DIAGNOSIS — R2689 Other abnormalities of gait and mobility: Secondary | ICD-10-CM | POA: Diagnosis not present

## 2018-02-15 DIAGNOSIS — Z8673 Personal history of transient ischemic attack (TIA), and cerebral infarction without residual deficits: Secondary | ICD-10-CM | POA: Diagnosis not present

## 2018-02-19 DIAGNOSIS — H401133 Primary open-angle glaucoma, bilateral, severe stage: Secondary | ICD-10-CM | POA: Diagnosis not present

## 2018-02-21 DIAGNOSIS — M1712 Unilateral primary osteoarthritis, left knee: Secondary | ICD-10-CM | POA: Diagnosis not present

## 2018-02-22 ENCOUNTER — Other Ambulatory Visit: Payer: Self-pay | Admitting: Primary Care

## 2018-02-22 DIAGNOSIS — E119 Type 2 diabetes mellitus without complications: Secondary | ICD-10-CM | POA: Diagnosis not present

## 2018-02-22 DIAGNOSIS — Z8669 Personal history of other diseases of the nervous system and sense organs: Secondary | ICD-10-CM | POA: Diagnosis not present

## 2018-02-22 DIAGNOSIS — M170 Bilateral primary osteoarthritis of knee: Secondary | ICD-10-CM | POA: Diagnosis not present

## 2018-02-22 DIAGNOSIS — M5136 Other intervertebral disc degeneration, lumbar region: Secondary | ICD-10-CM | POA: Diagnosis not present

## 2018-02-22 DIAGNOSIS — G629 Polyneuropathy, unspecified: Secondary | ICD-10-CM | POA: Diagnosis not present

## 2018-02-22 DIAGNOSIS — G4700 Insomnia, unspecified: Secondary | ICD-10-CM

## 2018-02-22 DIAGNOSIS — Z8673 Personal history of transient ischemic attack (TIA), and cerebral infarction without residual deficits: Secondary | ICD-10-CM | POA: Diagnosis not present

## 2018-02-22 DIAGNOSIS — R2689 Other abnormalities of gait and mobility: Secondary | ICD-10-CM | POA: Diagnosis not present

## 2018-02-22 DIAGNOSIS — Z9181 History of falling: Secondary | ICD-10-CM | POA: Diagnosis not present

## 2018-02-22 DIAGNOSIS — M25512 Pain in left shoulder: Secondary | ICD-10-CM | POA: Diagnosis not present

## 2018-02-23 ENCOUNTER — Other Ambulatory Visit: Payer: Self-pay

## 2018-02-23 ENCOUNTER — Ambulatory Visit: Payer: Medicare HMO | Admitting: Neurology

## 2018-02-23 ENCOUNTER — Encounter: Payer: Self-pay | Admitting: Neurology

## 2018-02-23 VITALS — BP 142/88 | HR 62 | Ht 64.0 in | Wt 165.0 lb

## 2018-02-23 DIAGNOSIS — G3184 Mild cognitive impairment, so stated: Secondary | ICD-10-CM | POA: Diagnosis not present

## 2018-02-23 DIAGNOSIS — G43709 Chronic migraine without aura, not intractable, without status migrainosus: Secondary | ICD-10-CM

## 2018-02-23 MED ORDER — SUMATRIPTAN SUCCINATE 25 MG PO TABS: ORAL_TABLET | ORAL | 3 refills | Status: DC

## 2018-02-23 MED ORDER — DONEPEZIL HCL 10 MG PO TABS: ORAL_TABLET | ORAL | 3 refills | Status: DC

## 2018-02-23 NOTE — Progress Notes (Signed)
NEUROLOGY FOLLOW UP OFFICE NOTE  Shannon Higgins. [name] [id number] 01-11-1935  HISTORY OF PRESENT ILLNESS: I had the pleasure of seeing Shannon Higgins in follow-up in the neurology clinic on 02/23/2018.  The patient was last seen 6 months ago for chronic migraines and memory loss. She is again accompanied by her daughter Shannon Higgins who helps supplement the history today. Her MRI brain done in 03/2017 showed an acute/subacute cortical infarct involving the left superior temporal gyrus. There was extensive chronic microvascular disease. Echocardiogram showed EF 74-94%, grade 2 diastolic dysfunction, moderate pulmonary hypertension, left atrium normal. Carotid dopplers showed less than 50% bilateral carotid stenosis. There was note of abnormal flow in the right vertebral artery indicating either a more proximal stenosis or early subclavian steal syndrome. She denies any focal numbness/tingling/weakness in the extremities. She is taking a daily aspirin. She has continued with Botox, initially did not notice much difference, but now reports that the Botox has really helped her. Migraines were previously occurring on a daily basis, she has migraines once in a while, lasting a couple of days in a row once a month. She sparingly uses sumatriptan. She reports a mild headache today but states it is not a migraine. They report both legs feel weak, she has started doing home PT. Her daughter reports memory is stable. She is taking Donepezil 61m daily without side effects. She does not drive. Her daughter manages medications and finances. She denies any dizziness, diplopia, no falls.   HPI 02/08/2017: This is a 83year old right-handed woman with a history of hypertension, diabetes, rheumatoid arthritis, breast cancer, presenting for evaluation of chronic headaches and confusion.   1. Headaches Headaches started in her 398s She went for a period of time with no significant headaches for a year, then last June headaches recurred  lasting for weeks at a time. She has 20 to 25 headache days a month, with nausea, upset stomach, sometimes diarrhea. She describes a lot of pressure and throbbing. She had a headache that lasted for 2 months, resolving 2 weeks ago, then this weekend headaches started back and have been ongoing for 4-5 days. She denies any visual obscurations, no photo/phonophobia. She does not usually have dizziness with the headaches, but recently has been so dizzy she had difficulty getting to the bathroom the past week, with described spinning sensation. Meclizine helps some. She has had vertigo in the past. No family history of migraines. She has tried several headache preventative medications, including amitriptyline, Topiramate. She is taking Propranolol 827mdaily with no side effects. She is also on Neurontin for back pain and Cymbalta for mood.   2. Confusion. She reports word-finding difficulties for the past 6 months. Her daughter mostly describes the episodic confusion as forgetting what she had previously told the patient. She has been taking an old meclizine prescription, but forgot that her daughter told her this information. She has not been driving for the past month due to glaucoma, denied previously getting lost driving. After her husband passed away 1.5 years ago, she moved in with her daughter and son-in-law. They are in charge of finances. Her daughter states she forgets her medications. One time she forgot to take her Cymbalta for a week. No paranoia or hallucinations. She used to be a social butterfly but now likes to be more at home. There is a strong family history of dementia in her mother and multiple siblings.   She denies any diplopia, dysarthria/dysphagia, anosmia, or tremors. She has chronic neck and back  pain and neuropathy in both feet radiating up her legs. She has a little urinary incontinence, no bowel issues. They report several falls with the dizziness. After she hit her head with fall in  July, she had a bad headache for 6 weeks. She was veering to the left and went to the ER on 7/13 where head CT did not show any acute changes. She got a migraine cocktail but headache was still bad the next day.    PAST MEDICAL HISTORY: Past Medical History:  Diagnosis Date  . Asthma   . Breast cancer ([place])   . Chronic sinusitis   . Diabetes mellitus without complication ([place])   . GERD (gastroesophageal reflux disease)   . History of recurrent UTIs   . Hypertension   . Insomnia   . Migraine   . Neuropathic pain   . Pneumonia   . Rheumatoid arthritis ([place])   . Sleep apnea   . Stroke ([place])   . Thyroid disease     MEDICATIONS: Current Outpatient Medications on File Prior to Visit  Medication Sig Dispense Refill  . amoxicillin (AMOXIL) 500 MG capsule Take 4 capsules by mouth one hour prior to appiontment    . anastrozole (ARIMIDEX) 1 MG tablet Take 1 mg by mouth daily.    . blood glucose meter kit and supplies Dispense based on patient and insurance preference. Use up to 2 times daily as directed. (FOR ICD-10 E10.9, E11.9). 1 each 0  . cetirizine (ZYRTEC) 10 MG chewable tablet Chew 1 tablet by mouth once daily as needed for allergies. 90 tablet 0  . donepezil (ARICEPT) 10 MG tablet Take 1/2 tablet daily for a month, then increase to 1 tablet daily 30 tablet 11  . DULoxetine (CYMBALTA) 60 MG capsule Take 1 capsule by mouth once daily for depression. 90 capsule 3  . fluticasone (FLONASE) 50 MCG/ACT nasal spray Place 2 sprays into both nostrils daily. 48 g 3  . gabapentin (NEURONTIN) 800 MG tablet Take 1 capsule by mouth three times daily for neuropathy. 270 tablet 3  . latanoprost (XALATAN) 0.005 % ophthalmic solution Place 1 drop into both eyes at bedtime.    [name] levothyroxine (SYNTHROID) 112 MCG tablet Take 1 tablet by mouth every morning on an empty stomach with a full glass of water. No food or other medications for 30 minutes. 90 tablet 0  . meclizine (ANTIVERT) 25 MG tablet Take 1  tablet (25 mg total) by mouth 2 (two) times daily as needed for dizziness. 180 tablet 0  . meloxicam (MOBIC) 15 MG tablet Take 1 tablet by mouth once daily as needed for pain. 90 tablet 0  . metFORMIN (GLUCOPHAGE) 500 MG tablet Take 1 tablet by mouth twice daily with food for diabetes. 180 tablet 3  . Netarsudil Dimesylate (RHOPRESSA) 0.02 % SOLN Apply 1 drop to eye at bedtime.    [name] omeprazole (PRILOSEC) 20 MG capsule Take 1 capsule by mouth twice daily for heartburn. 180 capsule 3  . propranolol (INDERAL) 80 MG tablet Take 1 tablet by mouth once daily for blood pressure. 90 tablet 3  . rosuvastatin (CRESTOR) 10 MG tablet Take 1 tablet by mouth once daily for cholesterol. 90 tablet 3  . SUMAtriptan (IMITREX) 25 MG tablet Take as needed for migraine. Do not take more than twice a week 8 tablet 5   No current facility-administered medications on file prior to visit.     ALLERGIES: No Known Allergies  FAMILY HISTORY: Family History  Problem Relation  Age of Onset  . Diabetes Daughter   . Hypertension Daughter   . Fibromyalgia Daughter   . GER disease Daughter   . Fibromyalgia Daughter   . Crohn's disease Daughter   . Asthma Daughter   . Hypertension Mother     SOCIAL HISTORY: Social History   Socioeconomic History  . Marital status: Widowed    Spouse name: Not on file  . Number of children: Not on file  . Years of education: Not on file  . Highest education level: Not on file  Occupational History  . Not on file  Social Needs  . Financial resource strain: Not on file  . Food insecurity:    Worry: Not on file    Inability: Not on file  . Transportation needs:    Medical: Not on file    Non-medical: Not on file  Tobacco Use  . Smoking status: Never Smoker  . Smokeless tobacco: Never Used  Substance and Sexual Activity  . Alcohol use: No  . Drug use: No  . Sexual activity: Not on file  Lifestyle  . Physical activity:    Days per week: Not on file    Minutes per  session: Not on file  . Stress: Not on file  Relationships  . Social connections:    Talks on phone: Not on file    Gets together: Not on file    Attends religious service: Not on file    Active member of club or organization: Not on file    Attends meetings of clubs or organizations: Not on file    Relationship status: Not on file  . Intimate partner violence:    Fear of current or ex partner: Not on file    Emotionally abused: Not on file    Physically abused: Not on file    Forced sexual activity: Not on file  Other Topics Concern  . Not on file  Social History Narrative   Pt lives in 1 story home with her daughter, Shannon Higgins and [name]'s husband   Has 2 adult daughters   Highest level of education: some college   Worked mainly as Web designer.    REVIEW OF SYSTEMS: Constitutional: No fevers, chills, or sweats, no generalized fatigue, change in appetite Eyes: No visual changes, double vision, eye pain Ear, nose and throat: No hearing loss, ear pain, nasal congestion, sore throat Cardiovascular: No chest pain, palpitations Respiratory:  No shortness of breath at rest or with exertion, wheezes GastrointestinaI: No nausea, vomiting, diarrhea, abdominal pain, fecal incontinence Genitourinary:  No dysuria, urinary retention or frequency Musculoskeletal:  + neck pain, back pain Integumentary: No rash, pruritus, skin lesions Neurological: as above Psychiatric: No depression, insomnia, anxiety Endocrine: No palpitations, fatigue, diaphoresis, mood swings, change in appetite, change in weight, increased thirst Hematologic/Lymphatic:  No anemia, purpura, petechiae. Allergic/Immunologic: no itchy/runny eyes, nasal congestion, recent allergic reactions, rashes  PHYSICAL EXAM: Vitals:   02/23/18 1556  BP: (!) 142/88  Pulse: 62  SpO2: 91%   General: No acute distress Head:  Normocephalic/atraumatic, +redness of bilateral conjunctiva Neck: supple, no paraspinal tenderness,  full range of motion Heart:  Regular rate and rhythm Lungs:  Clear to auscultation bilaterally Back: No paraspinal tenderness Skin/Extremities: No rash, no edema Neurological Exam: alert and oriented to person, place, and time. No aphasia or dysarthria. Fund of knowledge is appropriate.  Recent and remote memory are impaired, 1/3 delayed recall. Attention and concentration are normal.    Able to name objects and  repeat phrases. Cranial nerves: Pupils equal, round, reactive to light.  Extraocular movements intact with no nystagmus. Visual fields full. Facial sensation intact. No facial asymmetry. Tongue, uvula, palate midline.  Motor: Bulk and tone normal, muscle strength 5/5 throughout with no pronator drift.  Sensation to light touch intact.  No extinction to double simultaneous stimulation.  Deep tendon reflexes +1 throughout, toes downgoing.  Finger to nose testing intact.  Gait slow and cautious, no ataxia.  IMPRESSION: This is an 83 yo RH woman with a history of hypertension, diabetes, rheumatoid arthritis, breast cancer, with chronic migraines and mild dementia. Migraines have significantly improved with Botox, continue Botox every 3 months. As part of dementia workup, she had an MRI brain which showed an acute/subacute cortical infarct involving the left superior temporal gyrus last 03/2017. She is asymptomatic from the stroke, stroke workup unremarkable, etiology unclear. Continue daily aspirin, control of vascular risk factors. We discussed to use Imitrex sparingly, it has been the most helpful of rescue medications, she is aware of risks. They know to go to the ER for any sudden changes in symptoms, follow-up in 6 months.   Thank you for allowing me to participate in her care.  Please do not hesitate to call for any questions or concerns.  The duration of this appointment visit was 20 minutes of face-to-face time with the patient.  Greater than 50% of this time was spent in counseling,  explanation of diagnosis, planning of further management, and coordination of care.   Ellouise Newer, M.D.   CC: Alma Friendly, NP

## 2018-02-23 NOTE — Patient Instructions (Addendum)
Great seeing you! Continue all your medications. Continue Botox every 3 months. For any sudden changes in symptoms, go to ER immediately. Follow-up in 6 months, call for any changes.

## 2018-02-26 ENCOUNTER — Encounter: Payer: Self-pay | Admitting: Neurology

## 2018-02-27 DIAGNOSIS — N390 Urinary tract infection, site not specified: Secondary | ICD-10-CM

## 2018-02-27 DIAGNOSIS — R350 Frequency of micturition: Secondary | ICD-10-CM

## 2018-02-28 DIAGNOSIS — M1712 Unilateral primary osteoarthritis, left knee: Secondary | ICD-10-CM | POA: Diagnosis not present

## 2018-03-01 DIAGNOSIS — M5136 Other intervertebral disc degeneration, lumbar region: Secondary | ICD-10-CM | POA: Diagnosis not present

## 2018-03-02 ENCOUNTER — Encounter: Payer: Self-pay | Admitting: Family Medicine

## 2018-03-02 ENCOUNTER — Ambulatory Visit (INDEPENDENT_AMBULATORY_CARE_PROVIDER_SITE_OTHER): Payer: Medicare HMO | Admitting: Family Medicine

## 2018-03-02 VITALS — BP 122/80 | HR 60 | Temp 97.8°F | Ht 64.0 in | Wt 164.5 lb

## 2018-03-02 DIAGNOSIS — N3090 Cystitis, unspecified without hematuria: Secondary | ICD-10-CM

## 2018-03-02 DIAGNOSIS — R3915 Urgency of urination: Secondary | ICD-10-CM | POA: Diagnosis not present

## 2018-03-02 DIAGNOSIS — R8290 Unspecified abnormal findings in urine: Secondary | ICD-10-CM

## 2018-03-02 LAB — POC URINALSYSI DIPSTICK (AUTOMATED)
GLUCOSE UA: NEGATIVE
NITRITE UA: NEGATIVE
Protein, UA: POSITIVE — AB
Spec Grav, UA: 1.025 (ref 1.010–1.025)
UROBILINOGEN UA: 1 U/dL
pH, UA: 6 (ref 5.0–8.0)

## 2018-03-02 NOTE — Progress Notes (Signed)
Subjective:    Patient ID: Shannon Higgins. [name], female    DOB: 10/14/1934, 83 y.o.   MRN: [id number]  HPI This is an 83 yo female who presents today with cough and nasal congestion. Used some nose sprays with improvement. She also feels that essential oils help with opening up her breathing. Cough seems to have resolved.  Has noticed urinary frequency x 10 days. No dysuria. Nocturia x 1. No blood. Chronic back pain. Had an injection yesterday. No nausea or vomiting.   Past Medical History:  Diagnosis Date  . Asthma   . Breast cancer ([place])   . Chronic sinusitis   . Diabetes mellitus without complication ([place])   . GERD (gastroesophageal reflux disease)   . History of recurrent UTIs   . Hypertension   . Insomnia   . Migraine   . Neuropathic pain   . Pneumonia   . Rheumatoid arthritis ([place])   . Sleep apnea   . Stroke ([place])   . Thyroid disease    Past Surgical History:  Procedure Laterality Date  . ABDOMINAL HYSTERECTOMY    . BACK SURGERY    . BREAST LUMPECTOMY    . CHOLECYSTECTOMY    . KNEE SURGERY    . SHOULDER SURGERY     Family History  Problem Relation Age of Onset  . Diabetes Daughter   . Hypertension Daughter   . Fibromyalgia Daughter   . GER disease Daughter   . Fibromyalgia Daughter   . Crohn's disease Daughter   . Asthma Daughter   . Hypertension Mother    Social History   Tobacco Use  . Smoking status: Never Smoker  . Smokeless tobacco: Never Used  Substance Use Topics  . Alcohol use: No  . Drug use: No      Review of Systems Per HPI    Objective:   Physical Exam Physical Exam  Constitutional: She is oriented to person, place, and time. She appears well-developed and well-nourished. No distress.  HENT:  Head: Normocephalic and atraumatic.  Cardiovascular: Normal rate, regular rhythm and normal heart sounds.   Pulmonary/Chest: Effort normal and breath sounds normal.  Abdominal: Soft. She exhibits no distension. There is no tenderness. There is  no rebound, no guarding and no CVA tenderness.  Neurological: She is alert and oriented to person, place, and time.  Skin: Skin is warm and dry. She is not diaphoretic.  Psychiatric: She has a normal mood and affect. Her behavior is normal. Judgment and thought content normal.  Vitals reviewed.   BP 122/80 (BP Location: Left Arm, Patient Position: Sitting, Cuff Size: Normal)   Pulse 60   Temp 97.8 F (36.6 C) (Oral)   Ht 5\' 4"  (1.626 m)   Wt 164 lb 8 oz (74.6 kg)   SpO2 97%   BMI 28.24 kg/m  Wt Readings from Last 3 Encounters:  03/02/18 164 lb 8 oz (74.6 kg)  02/23/18 165 lb (74.8 kg)  01/29/18 166 lb 4 oz (75.4 kg)   Results for orders placed or performed in visit on 03/02/18  POCT Urinalysis Dipstick (Automated)  Result Value Ref Range   Color, UA gold    Clarity, UA clear    Glucose, UA Negative Negative   Bilirubin, UA 2+    Ketones, UA +/-    Spec Grav, UA 1.025 1.010 - 1.025   Blood, UA +/-    pH, UA 6.0 5.0 - 8.0   Protein, UA Positive (A) Negative   Urobilinogen, UA 1.0  0.2 or 1.0 E.U./dL   Nitrite, UA negative    Leukocytes, UA Small (1+) (A) Negative       Assessment & Plan:  1. Urinary urgency - POCT Urinalysis Dipstick (Automated)  2. Abnormal urinalysis - will check culture to see if significant bacterial present - encouraged her to increase fluids - RTC precautions reviewed - Urine Culture   Clarene Reamer, FNP-BC  [facility]  03/05/2018 6:32 AM

## 2018-03-02 NOTE — Patient Instructions (Addendum)
Good to see you today  Your urine was not conclusive for an infection so I am sending it for a urine culture and will notify you of results next week  Continue to drink enough water to make your urine light yellow

## 2018-03-04 LAB — URINE CULTURE
MICRO NUMBER:: 91317602
SPECIMEN QUALITY:: ADEQUATE

## 2018-03-05 ENCOUNTER — Encounter: Payer: Self-pay | Admitting: Family Medicine

## 2018-03-05 MED ORDER — CEPHALEXIN 500 MG PO CAPS: 500.0000 mg | ORAL_CAPSULE | Freq: Two times a day (BID) | ORAL | 0 refills | Status: AC

## 2018-03-06 ENCOUNTER — Telehealth: Payer: Self-pay | Admitting: Primary Care

## 2018-03-06 NOTE — Telephone Encounter (Signed)
Left message to call Rosaria Ferries back about Urology Referral.

## 2018-03-07 ENCOUNTER — Telehealth: Payer: Self-pay | Admitting: Primary Care

## 2018-03-07 ENCOUNTER — Encounter: Payer: Self-pay | Admitting: Family Medicine

## 2018-03-07 NOTE — Telephone Encounter (Signed)
[facility] and notified them that patient is no longer taking trazodone.

## 2018-03-07 NOTE — Telephone Encounter (Signed)
Patient and her daughter notified me that she is no longer taking Trazodone.

## 2018-03-07 NOTE — Telephone Encounter (Signed)
[facility] called in to get a prescription request sent over for the Trazodone 100mg .

## 2018-03-08 ENCOUNTER — Other Ambulatory Visit: Payer: Self-pay | Admitting: Neurology

## 2018-03-08 DIAGNOSIS — G3184 Mild cognitive impairment, so stated: Secondary | ICD-10-CM

## 2018-03-21 ENCOUNTER — Telehealth: Payer: Self-pay | Admitting: Primary Care

## 2018-03-21 DIAGNOSIS — M792 Neuralgia and neuritis, unspecified: Secondary | ICD-10-CM

## 2018-03-21 NOTE — Telephone Encounter (Signed)
Patient did not leave which pharmacy to send Rx to.

## 2018-03-21 NOTE — Telephone Encounter (Signed)
Pt called back stating she will be using [facility], [place] [address], [phone].

## 2018-03-21 NOTE — Telephone Encounter (Signed)
Ok to send

## 2018-03-21 NOTE — Telephone Encounter (Signed)
Pt called office stating she is out of town and she didn't bring enough tablets of the Gabapentin. Pt is requesting 20 tablets to hold her until she gets home.

## 2018-03-21 NOTE — Telephone Encounter (Signed)
Per DPR, left detail message of Kate Clark's comments for patient to call back 

## 2018-03-22 ENCOUNTER — Telehealth: Payer: Self-pay | Admitting: Primary Care

## 2018-03-22 MED ORDER — GABAPENTIN 800 MG PO TABS: 800.0000 mg | ORAL_TABLET | Freq: Three times a day (TID) | ORAL | 0 refills | Status: DC

## 2018-03-22 NOTE — Telephone Encounter (Signed)
Rx for short term supply of [facility]apentin [facility] [place] as requested.

## 2018-03-22 NOTE — Addendum Note (Signed)
Addended by: Pleas Koch on: 03/22/2018 11:04 AM   Modules accepted: Orders

## 2018-03-22 NOTE — Telephone Encounter (Signed)
° ° ° °  1. Which medications need to be refilled? (please list name of each medication and dose if known) gabapentin 800 mg 2. Which pharmacy/location (including street and city if local pharmacy) [facility]

## 2018-03-22 NOTE — Telephone Encounter (Signed)
This has been addressed in another encounter and patient had been notified.

## 2018-04-05 DIAGNOSIS — H401123 Primary open-angle glaucoma, left eye, severe stage: Secondary | ICD-10-CM | POA: Diagnosis not present

## 2018-04-06 ENCOUNTER — Ambulatory Visit (INDEPENDENT_AMBULATORY_CARE_PROVIDER_SITE_OTHER): Payer: Medicare HMO | Admitting: Neurology

## 2018-04-06 DIAGNOSIS — IMO0002 Reserved for concepts with insufficient information to code with codable children: Secondary | ICD-10-CM

## 2018-04-06 DIAGNOSIS — G43709 Chronic migraine without aura, not intractable, without status migrainosus: Secondary | ICD-10-CM | POA: Diagnosis not present

## 2018-04-06 MED ORDER — ONABOTULINUMTOXINA 100 UNITS IJ SOLR
155.0000 [IU] | Freq: Once | INTRAMUSCULAR | Status: AC
  Administered 2018-04-06: 155 [IU] via INTRAMUSCULAR

## 2018-04-06 NOTE — Progress Notes (Signed)
[facility]   Procedure Note Botox  Attending: Dr. Tomi Likens  Preoperative Diagnosis(es): Chronic migraine  Consent obtained from: The patient Benefits discussed included, but were not limited to decreased muscle tightness, increased joint range of motion, and decreased pain.  Risk discussed included, but were not limited pain and discomfort, bleeding, bruising, excessive weakness, venous thrombosis, muscle atrophy and dysphagia.  Anticipated outcomes of the procedure as well as he risks and benefits of the alternatives to the procedure, and the roles and tasks of the personnel to be involved, were discussed with the patient, and the patient consents to the procedure and agrees to proceed. A copy of the patient medication guide was given to the patient which explains the blackbox warning.  Patients identity and treatment sites confirmed Yes.  .  Details of Procedure: Skin was cleaned with alcohol. Prior to injection, the needle plunger was aspirated to make sure the needle was not within a blood vessel.  There was no blood retrieved on aspiration.    Following is a summary of the muscles injected  And the amount of Botulinum toxin used:  Dilution 200 units of Botox was reconstituted with 4 ml of preservative free normal saline. Time of reconstitution: At the time of the office visit (<30 minutes prior to injection)   Injections  155 total units of Botox was injected with a 30 gauge needle.  Injection Sites: L occipitalis: 15 units- 3 sites  R occiptalis: 15 units- 3 sites  L upper trapezius: 15 units- 3 sites R upper trapezius: 15 units- 3 sits          L paraspinal: 10 units- 2 sites R paraspinal: 10 units- 2 sites  Face L frontalis(2 injection sites):10 units   R frontalis(2 injection sites):10 units         L corrugator: 5 units   R corrugator: 5 units           Procerus: 5 units   L temporalis: 20 units R temporalis: 20 units   Agent:  200 units of botulinum Type A  (Onobotulinum Toxin type A) was reconstituted with 4 ml of preservative free normal saline.  Time of reconstitution: At the time of the office visit (<30 minutes prior to injection)     Total injected (Units): 155  Total wasted (Units): 45  Patient tolerated procedure well without complications.   Reinjection is anticipated in 3 months.

## 2018-04-09 ENCOUNTER — Ambulatory Visit: Payer: Self-pay | Admitting: Urology

## 2018-05-07 ENCOUNTER — Ambulatory Visit (INDEPENDENT_AMBULATORY_CARE_PROVIDER_SITE_OTHER): Payer: Medicare HMO | Admitting: Family Medicine

## 2018-05-07 ENCOUNTER — Encounter: Payer: Self-pay | Admitting: Family Medicine

## 2018-05-07 ENCOUNTER — Encounter (HOSPITAL_COMMUNITY): Payer: Self-pay | Admitting: Emergency Medicine

## 2018-05-07 ENCOUNTER — Observation Stay (HOSPITAL_COMMUNITY)
Admission: EM | Admit: 2018-05-07 | Discharge: 2018-05-11 | Disposition: A | Discharge status: Skilled Nursing Facility | Payer: Medicare HMO | Attending: Internal Medicine | Admitting: Internal Medicine

## 2018-05-07 VITALS — BP 102/56 | HR 56 | Temp 97.9°F | Ht 64.0 in

## 2018-05-07 DIAGNOSIS — Z791 Long term (current) use of non-steroidal anti-inflammatories (NSAID): Secondary | ICD-10-CM | POA: Insufficient documentation

## 2018-05-07 DIAGNOSIS — S8251XA Displaced fracture of medial malleolus of right tibia, initial encounter for closed fracture: Secondary | ICD-10-CM

## 2018-05-07 DIAGNOSIS — E039 Hypothyroidism, unspecified: Secondary | ICD-10-CM | POA: Diagnosis present

## 2018-05-07 DIAGNOSIS — F329 Major depressive disorder, single episode, unspecified: Secondary | ICD-10-CM | POA: Diagnosis present

## 2018-05-07 DIAGNOSIS — Z7984 Long term (current) use of oral hypoglycemic drugs: Secondary | ICD-10-CM | POA: Insufficient documentation

## 2018-05-07 DIAGNOSIS — Z853 Personal history of malignant neoplasm of breast: Secondary | ICD-10-CM | POA: Insufficient documentation

## 2018-05-07 DIAGNOSIS — E861 Hypovolemia: Secondary | ICD-10-CM

## 2018-05-07 DIAGNOSIS — Z8673 Personal history of transient ischemic attack (TIA), and cerebral infarction without residual deficits: Secondary | ICD-10-CM | POA: Diagnosis not present

## 2018-05-07 DIAGNOSIS — I10 Essential (primary) hypertension: Secondary | ICD-10-CM | POA: Diagnosis not present

## 2018-05-07 DIAGNOSIS — R42 Dizziness and giddiness: Secondary | ICD-10-CM | POA: Diagnosis not present

## 2018-05-07 DIAGNOSIS — N390 Urinary tract infection, site not specified: Secondary | ICD-10-CM

## 2018-05-07 DIAGNOSIS — S0990XA Unspecified injury of head, initial encounter: Secondary | ICD-10-CM | POA: Diagnosis not present

## 2018-05-07 DIAGNOSIS — I951 Orthostatic hypotension: Principal | ICD-10-CM | POA: Diagnosis present

## 2018-05-07 DIAGNOSIS — G4700 Insomnia, unspecified: Secondary | ICD-10-CM | POA: Insufficient documentation

## 2018-05-07 DIAGNOSIS — W19XXXD Unspecified fall, subsequent encounter: Secondary | ICD-10-CM | POA: Insufficient documentation

## 2018-05-07 DIAGNOSIS — S8253XA Displaced fracture of medial malleolus of unspecified tibia, initial encounter for closed fracture: Secondary | ICD-10-CM

## 2018-05-07 DIAGNOSIS — K219 Gastro-esophageal reflux disease without esophagitis: Secondary | ICD-10-CM | POA: Diagnosis not present

## 2018-05-07 DIAGNOSIS — Z79890 Hormone replacement therapy (postmenopausal): Secondary | ICD-10-CM | POA: Diagnosis not present

## 2018-05-07 DIAGNOSIS — N179 Acute kidney failure, unspecified: Secondary | ICD-10-CM | POA: Diagnosis not present

## 2018-05-07 DIAGNOSIS — E1142 Type 2 diabetes mellitus with diabetic polyneuropathy: Secondary | ICD-10-CM | POA: Diagnosis not present

## 2018-05-07 DIAGNOSIS — W19XXXA Unspecified fall, initial encounter: Secondary | ICD-10-CM

## 2018-05-07 DIAGNOSIS — Z7982 Long term (current) use of aspirin: Secondary | ICD-10-CM | POA: Diagnosis not present

## 2018-05-07 DIAGNOSIS — M25562 Pain in left knee: Secondary | ICD-10-CM | POA: Diagnosis not present

## 2018-05-07 DIAGNOSIS — S8251XD Displaced fracture of medial malleolus of right tibia, subsequent encounter for closed fracture with routine healing: Secondary | ICD-10-CM | POA: Insufficient documentation

## 2018-05-07 DIAGNOSIS — M545 Low back pain: Secondary | ICD-10-CM | POA: Diagnosis not present

## 2018-05-07 DIAGNOSIS — R946 Abnormal results of thyroid function studies: Secondary | ICD-10-CM | POA: Diagnosis not present

## 2018-05-07 DIAGNOSIS — G4733 Obstructive sleep apnea (adult) (pediatric): Secondary | ICD-10-CM | POA: Diagnosis present

## 2018-05-07 DIAGNOSIS — Z87440 Personal history of urinary (tract) infections: Secondary | ICD-10-CM | POA: Diagnosis not present

## 2018-05-07 DIAGNOSIS — S99911A Unspecified injury of right ankle, initial encounter: Secondary | ICD-10-CM | POA: Diagnosis not present

## 2018-05-07 DIAGNOSIS — S8991XA Unspecified injury of right lower leg, initial encounter: Secondary | ICD-10-CM | POA: Diagnosis not present

## 2018-05-07 DIAGNOSIS — F339 Major depressive disorder, recurrent, unspecified: Secondary | ICD-10-CM | POA: Diagnosis present

## 2018-05-07 DIAGNOSIS — R296 Repeated falls: Secondary | ICD-10-CM | POA: Diagnosis not present

## 2018-05-07 DIAGNOSIS — S8992XA Unspecified injury of left lower leg, initial encounter: Secondary | ICD-10-CM | POA: Diagnosis not present

## 2018-05-07 DIAGNOSIS — F0390 Unspecified dementia without behavioral disturbance: Secondary | ICD-10-CM | POA: Diagnosis not present

## 2018-05-07 DIAGNOSIS — M069 Rheumatoid arthritis, unspecified: Secondary | ICD-10-CM | POA: Diagnosis not present

## 2018-05-07 DIAGNOSIS — E860 Dehydration: Secondary | ICD-10-CM | POA: Diagnosis not present

## 2018-05-07 DIAGNOSIS — J45909 Unspecified asthma, uncomplicated: Secondary | ICD-10-CM | POA: Insufficient documentation

## 2018-05-07 DIAGNOSIS — M25571 Pain in right ankle and joints of right foot: Secondary | ICD-10-CM | POA: Diagnosis not present

## 2018-05-07 DIAGNOSIS — S299XXA Unspecified injury of thorax, initial encounter: Secondary | ICD-10-CM | POA: Diagnosis not present

## 2018-05-07 DIAGNOSIS — S3992XA Unspecified injury of lower back, initial encounter: Secondary | ICD-10-CM | POA: Diagnosis not present

## 2018-05-07 DIAGNOSIS — S199XXA Unspecified injury of neck, initial encounter: Secondary | ICD-10-CM | POA: Diagnosis not present

## 2018-05-07 LAB — URINALYSIS, ROUTINE W REFLEX MICROSCOPIC
Bilirubin Urine: NEGATIVE
Glucose, UA: NEGATIVE mg/dL
Hgb urine dipstick: NEGATIVE
KETONES UR: NEGATIVE mg/dL
NITRITE: NEGATIVE
Protein, ur: 30 mg/dL — AB
Specific Gravity, Urine: 1.016 (ref 1.005–1.030)
WBC, UA: >50 WBC/hpf — ABNORMAL HIGH (ref 0–5)
pH: 5 (ref 5.0–8.0)

## 2018-05-07 LAB — CBC
HCT: 44 % (ref 36.0–46.0)
Hemoglobin: 13.9 g/dL (ref 12.0–15.0)
MCH: 28.9 pg (ref 26.0–34.0)
MCHC: 31.6 g/dL (ref 30.0–36.0)
MCV: 91.5 fL (ref 80.0–100.0)
NRBC: 0 % (ref 0.0–0.2)
Platelets: 433 10*3/uL — ABNORMAL HIGH (ref 150–400)
RBC: 4.81 MIL/uL (ref 3.87–5.11)
RDW: 14.3 % (ref 11.5–15.5)
WBC: 15.4 10*3/uL — ABNORMAL HIGH (ref 4.0–10.5)

## 2018-05-07 LAB — BASIC METABOLIC PANEL
Anion gap: 14 (ref 5–15)
BUN: 26 mg/dL — ABNORMAL HIGH (ref 8–23)
CO2: 20 mmol/L — ABNORMAL LOW (ref 22–32)
Calcium: 9.7 mg/dL (ref 8.9–10.3)
Chloride: 97 mmol/L — ABNORMAL LOW (ref 98–111)
Creatinine, Ser: 1.62 mg/dL — ABNORMAL HIGH (ref 0.44–1.00)
GFR calc non Af Amer: 29 mL/min — ABNORMAL LOW (ref >60)
GFR, EST AFRICAN AMERICAN: 34 mL/min — AB (ref >60)
Glucose, Bld: 173 mg/dL — ABNORMAL HIGH (ref 70–99)
Potassium: 5 mmol/L (ref 3.5–5.1)
SODIUM: 131 mmol/L — AB (ref 135–145)

## 2018-05-07 LAB — CBG MONITORING, ED: Glucose-Capillary: 148 mg/dL — ABNORMAL HIGH (ref 70–99)

## 2018-05-07 MED ORDER — SODIUM CHLORIDE 0.9 % IV BOLUS
1000.0000 mL | Freq: Once | INTRAVENOUS | Status: AC
  Administered 2018-05-07: 1000 mL via INTRAVENOUS

## 2018-05-07 MED ORDER — SODIUM CHLORIDE 0.9 % IV SOLN
1.0000 g | Freq: Once | INTRAVENOUS | Status: AC
  Administered 2018-05-07: 1 g via INTRAVENOUS
  Filled 2018-05-07: qty 10

## 2018-05-07 NOTE — ED Triage Notes (Signed)
Pt states starting on Friday night she has been having spells of dizziness that cause her to lose her balance and fall. Pt states on Friday night she fell and hit her head- denies LOC or blood thinners. Pt came from primary care after being found to have orthostatic hypotension.

## 2018-05-07 NOTE — Progress Notes (Signed)
Subjective:    Patient ID: Marcille Blanco. Priebe, female    DOB: Feb 10, 1935, 84 y.o.   MRN: [id number]  HPI This is an 84 yo female who presents today following a fall 4 days ago. She fell twice today. Felt like she ws going to pass out. She is accompanied by her daughter.  She has been feeling dizzy for about a week. Not unusual for her to feel dizzy but this is worse. Has been taking anitvert. Has had some headaches off and on. Has history of migraine and takes occasional sumatriptan. Appetite has been ok. Drinking fluids, ? Enough.   Past Medical History:  Diagnosis Date  . Asthma   . Breast cancer ([place])   . Chronic sinusitis   . Diabetes mellitus without complication ([place])   . GERD (gastroesophageal reflux disease)   . History of recurrent UTIs   . Hypertension   . Insomnia   . Migraine   . Neuropathic pain   . Pneumonia   . Rheumatoid arthritis ([place])   . Sleep apnea   . Stroke ([place])   . Thyroid disease    Past Surgical History:  Procedure Laterality Date  . ABDOMINAL HYSTERECTOMY    . BACK SURGERY    . BREAST LUMPECTOMY    . CHOLECYSTECTOMY    . KNEE SURGERY    . SHOULDER SURGERY     Family History  Problem Relation Age of Onset  . Diabetes Daughter   . Hypertension Daughter   . Fibromyalgia Daughter   . GER disease Daughter   . Fibromyalgia Daughter   . Crohn's disease Daughter   . Asthma Daughter   . Hypertension Mother    Social History   Tobacco Use  . Smoking status: Never Smoker  . Smokeless tobacco: Never Used  Substance Use Topics  . Alcohol use: No  . Drug use: No      Review of Systems Per HPI    Objective:   Physical Exam Vitals signs reviewed.  Constitutional:      Comments: Elderly female, appears stated age, sitting in wheelchair.   HENT:     Head:     Comments: Left side of crown with palpable knot. No erythema, no drainage, tender to palpation.     Nose: Nose normal.     Mouth/Throat:     Pharynx: Oropharynx is clear.  Eyes:       Extraocular Movements: Extraocular movements intact.     Conjunctiva/sclera: Conjunctivae normal.     Pupils: Pupils are equal, round, and reactive to light.  Cardiovascular:     Rate and Rhythm: Normal rate and regular rhythm.     Heart sounds: Normal heart sounds.  Pulmonary:     Effort: Pulmonary effort is normal.     Breath sounds: Normal breath sounds.  Skin:    General: Skin is dry.     Comments: Hands and feet cool. Bilateral lower extremities bluish tint, improved with elevating legs.   Neurological:     General: No focal deficit present.     Mental Status: She is alert.     Comments: Answered questions appropriately.   Psychiatric:        Mood and Affect: Mood normal.        Behavior: Behavior normal.        Thought Content: Thought content normal.        Judgment: Judgment normal.       BP (!) 102/56   Pulse (!) 56  Temp 97.9 F (36.6 C) (Tympanic)   Ht 5\' 4"  (1.626 m)   SpO2 93%   BMI 28.24 kg/m  Orthostatic Blood Pressure: Blood pressure:   lying 98/61, sitting 93/53, standing 68/54 Pulse:   lying 58, sitting 84, standing 60     Assessment & Plan:  1. Orthostatic hypotension - Patient with significant orthostatic hypotension and several recent falls, given symptoms and vs, have recommended her daughter take her to the emergency room for further evaluation and intervention  2. Fall, initial encounter - see #1  3. Lightheadedness - see #1   Clarene Reamer, FNP-BC  [facility], [place]  05/07/2018 8:56 PM

## 2018-05-07 NOTE — ED Provider Notes (Signed)
[facility] EMERGENCY DEPARTMENT Provider Note   CSN: [id number] Arrival date & time: 05/07/18  1659     History   Chief Complaint Chief Complaint  Patient presents with  . Fall    HPI Shannon Higgins is a 84 y.o. female with a history of rheumatoid arthritis, nephrolithiasis, diabetes mellitus, CVA, thyroid disease, and left breast cancer who presents to the emergency department with a chief complaint of fall.  The patient reports multiple falls over the last 4 days.  She reports that she had 2 falls earlier today.  She thinks she may have hit her head during 1 of the falls.  She states during the other 1 that she fell with her ankles crossed.  She reports that she fell 3 or 4 days ago and hit the back of her head on the floor.  She denies syncope, nausea, or emesis.  She reports neck and back pain that began today after the fall.  States the pain is worse in her low back and on her right mid-back, and her bilateral knees, and right ankle.  She reports that she required assistance with getting up from the fall earlier today.  She has been able to ambulate, but it is painful.  She reports that she has been getting significantly lightheaded and off-balance with standing for the last 4 days.  She has been feeling generally more weak and her mouth feels dry.  Describes the lightheadedness as feeling as if she might pass out.  She denies room spinning.  Lightheadedness is worse with standing and ambulation and improved with laying down.  She feels that she has been drinking plenty of fluids at home, but reports she is only been voiding a couple of times a day.  Reports her urine has looked dark and has had an odor.  She denies hematuria, dysuria or urinary hesitancy.  She denies fever, chills, nausea, vomiting, diarrhea, constipation, vaginal pain, bleeding, or discharge.  She also reports that she has had a left-sided headache since the beginning of December after receiving  Botox.  She reports the headache is been constant, but not worsening.  She denies diplopia, visual changes, numbness, weakness, slurred speech.  She reports that she was seen by primary care earlier today and was found to have positive orthostatic vital signs and was sent to the emergency department for further evaluation.   She is not anticoagulated.   The history is provided by the patient. No language interpreter was used.    Past Medical History:  Diagnosis Date  . Asthma   . Breast cancer ([place])   . Chronic sinusitis   . Diabetes mellitus without complication ([place])   . GERD (gastroesophageal reflux disease)   . History of recurrent UTIs   . Hypertension   . Insomnia   . Migraine   . Neuropathic pain   . Pneumonia   . Rheumatoid arthritis ([place])   . Sleep apnea   . Stroke ([place])   . Thyroid disease     Patient Active Problem List   Diagnosis Date Noted  . ARF (acute renal failure) ([place]) 05/08/2018  . Malignant neoplasm of upper-outer quadrant of left breast in female, estrogen receptor positive ([place]) 10/17/2017  . MDD (major depressive disorder) 06/21/2017  . Chronic midline low back pain with sciatica 01/13/2017  . Sinusitis 12/16/2016  . Hyperlipidemia 12/06/2016  . Glaucoma 12/06/2016  . Neuropathic pain 12/06/2016  . Skin cancer (melanoma) ([place]) 12/06/2016  . Confusion 12/06/2016  .  Chronic headaches 12/06/2016  . Transient hypotension 06/14/2016  . Hypothyroidism 06/14/2016  . Hyponatremia   . Hypokalemia 04/26/2012  . Anemia 04/26/2012  . Diabetes mellitus ([place]) 04/26/2012  . HTN (hypertension) 04/26/2012  . GERD (gastroesophageal reflux disease) 04/26/2012  . OSA (obstructive sleep apnea) 04/26/2012    Past Surgical History:  Procedure Laterality Date  . ABDOMINAL HYSTERECTOMY    . BACK SURGERY    . BREAST LUMPECTOMY    . CHOLECYSTECTOMY    . KNEE SURGERY    . SHOULDER SURGERY       OB History   No obstetric history on file.      Home  Medications    Prior to Admission medications   Medication Sig Start Date End Date Taking? Authorizing Provider  amoxicillin (AMOXIL) 500 MG capsule Take 2,000 mg by mouth See admin instructions. Prior to appointment   Yes [provider]  anastrozole (ARIMIDEX) 1 MG tablet Take 1 mg by mouth daily.   Yes [provider]  aspirin 81 MG tablet Take 81 mg by mouth daily.   Yes [provider]  BRIMONIDINE TARTRATE OP Apply 1 drop to eye 3 (three) times daily. Both eyes   Yes [provider]  cetirizine (ZYRTEC) 10 MG chewable tablet Chew 1 tablet by mouth once daily as needed for allergies. 12/26/17  Yes Pleas Koch, NP  donepezil (ARICEPT) 10 MG tablet Take 1 tablet daily Patient taking differently: Take 10 mg by mouth daily.  02/23/18  Yes Cameron Sprang, MD  dorzolamide-timolol (COSOPT) 22.3-6.8 MG/ML ophthalmic solution Place 1 drop into both eyes 2 (two) times daily.   Yes [provider]  DULoxetine (CYMBALTA) 60 MG capsule Take 1 capsule by mouth once daily for depression. 12/26/17  Yes Pleas Koch, NP  fluticasone (FLONASE) 50 MCG/ACT nasal spray Place 2 sprays into both nostrils daily. Patient taking differently: Place 2 sprays into both nostrils daily as needed for allergies or rhinitis.  06/21/17  Yes Pleas Koch, NP  gabapentin (NEURONTIN) 800 MG tablet Take 1 capsule by mouth three times daily for neuropathy. 12/26/17  Yes Pleas Koch, NP  ibuprofen (ADVIL,MOTRIN) 200 MG tablet Take 600 mg by mouth every 6 (six) hours as needed for mild pain.   Yes [provider]  latanoprost (XALATAN) 0.005 % ophthalmic solution Place 1 drop into both eyes at bedtime.   Yes [provider]  levothyroxine (SYNTHROID) 112 MCG tablet Take 1 tablet by mouth every morning on an empty stomach with a full glass of water. No food or other medications for 30 minutes. 12/27/17  Yes Pleas Koch, NP  meclizine (ANTIVERT)  25 MG tablet Take 1 tablet (25 mg total) by mouth 2 (two) times daily as needed for dizziness. 06/21/17  Yes Pleas Koch, NP  meloxicam (MOBIC) 15 MG tablet Take 1 tablet by mouth once daily as needed for pain. 12/26/17  Yes Pleas Koch, NP  metFORMIN (GLUCOPHAGE) 500 MG tablet Take 1 tablet by mouth twice daily with food for diabetes. 12/26/17  Yes Pleas Koch, NP  omeprazole (PRILOSEC) 20 MG capsule Take 1 capsule by mouth twice daily for heartburn. 12/26/17  Yes Pleas Koch, NP  propranolol (INDERAL) 80 MG tablet Take 1 tablet by mouth once daily for blood pressure. 12/26/17  Yes Pleas Koch, NP  rosuvastatin (CRESTOR) 10 MG tablet Take 1 tablet by mouth once daily for cholesterol. 12/27/17  Yes Pleas Koch, NP  SUMAtriptan (IMITREX) 25 MG tablet Take as needed for migraine. Do not take more than twice a week Patient taking differently: Take 25 mg by mouth daily as needed for migraine. Do not take more than twice a week 02/23/18  Yes Cameron Sprang, MD  traZODone (DESYREL) 100 MG tablet Take 100-200 mg by mouth at bedtime as needed for sleep.    Yes [provider]  blood glucose meter kit and supplies Dispense based on patient and insurance preference. Use up to 2 times daily as directed. (FOR ICD-10 E10.9, E11.9). 07/26/17   Pleas Koch, NP    Family History Family History  Problem Relation Age of Onset  . Diabetes Daughter   . Hypertension Daughter   . Fibromyalgia Daughter   . GER disease Daughter   . Fibromyalgia Daughter   . Crohn's disease Daughter   . Asthma Daughter   . Hypertension Mother     Social History Social History   Tobacco Use  . Smoking status: Never Smoker  . Smokeless tobacco: Never Used  Substance Use Topics  . Alcohol use: No  . Drug use: No     Allergies   Patient has no known allergies.   Review of Systems Review of Systems  Constitutional: Negative for activity change, chills and fever.    HENT: Negative for congestion.   Eyes: Negative for photophobia and visual disturbance.  Respiratory: Negative for shortness of breath.   Cardiovascular: Negative for chest pain.  Gastrointestinal: Negative for abdominal pain, blood in stool, diarrhea, nausea and vomiting.  Genitourinary: Positive for flank pain. Negative for dysuria, hematuria, urgency, vaginal bleeding, vaginal discharge and vaginal pain.       Malodorous urine  Musculoskeletal: Positive for arthralgias, back pain, myalgias and neck pain. Negative for neck stiffness.  Skin: Positive for wound (resolved). Negative for rash.  Allergic/Immunologic: Negative for immunocompromised state.  Neurological: Positive for weakness, light-headedness and headaches. Negative for seizures, syncope and numbness.  Psychiatric/Behavioral: Negative for confusion.     Physical Exam Updated Vital Signs BP (!) 123/59   Pulse 70   Temp 98 F (36.7 C) (Oral)   Resp 17   SpO2 97%   Physical Exam Vitals signs and nursing note reviewed.  Constitutional:      General: She is not in acute distress.    Appearance: She is not ill-appearing or diaphoretic.     Comments: Appears hypovolemic  HENT:     Head: Normocephalic.     Mouth/Throat:     Mouth: Mucous membranes are dry.     Pharynx: No oropharyngeal exudate or posterior oropharyngeal erythema.  Eyes:     Extraocular Movements: Extraocular movements intact.     Conjunctiva/sclera: Conjunctivae normal.     Pupils: Pupils are equal, round, and reactive to light.     Comments: Bilateral eyes are erythematous; states this is chronic and baseline  Neck:     Musculoskeletal: Neck supple.  Cardiovascular:     Rate and Rhythm: Normal rate and regular rhythm.     Heart sounds: No murmur. No friction rub. No gallop.   Pulmonary:     Effort: Pulmonary effort is normal. No respiratory distress.     Breath sounds: No stridor. No wheezing, rhonchi or rales.  Chest:     Chest wall: No  tenderness.  Abdominal:     General: There is no distension.     Palpations: Abdomen is soft. There is no mass.     Tenderness: There is  no abdominal tenderness. There is no right CVA tenderness, left CVA tenderness, guarding or rebound.     Hernia: No hernia is present.  Musculoskeletal:     Comments: Tender to palpation to the cervical spine, particularly over C7.  She has point tenderness to the spinous processes of the thoracic spine.  Mildly tender to the spinous processes diffusely of the lumbar spine.  Bilateral hips are nontender.  Tender to palpation over the medial malleolus of the right ankle.  Minimal tenderness over the lateral malleolus.  Achilles tendon is intact.  Increased pain with range of motion.  No obvious deformity.  Tender to palpation to the lateral malleolus of the left ankle.  No medial malleolus tenderness.  Full active and passive range of motion.  Tender to palpation to the lateral aspect of the inferior patella of the left knee.  No medial or joint line tenderness.  Increased pain with flexion and extension of the left knee.  Negative anterior and posterior drawer test.  Tender to palpation to the lateral aspect of the right knee.  No medial joint line tenderness.  No tenderness to palpation of the patella.  Quadriceps and patellar tendon are intact.  Negative anterior posterior drawer test.  Skin:    General: Skin is warm.     Capillary Refill: Capillary refill takes more than 3 seconds.     Findings: No rash.  Neurological:     Mental Status: She is alert.     Comments: Alert and oriented x4.  Moves all 4 extremities.  Sensation is intact and equal throughout.  5-5 strength to the bilateral upper and lower extremities.  Cranial nerves II through XII are grossly intact.  Speaks in complete, fluent sentences.  Answers all questions appropriately.  Psychiatric:        Behavior: Behavior normal.      ED Treatments / Results  Labs (all labs ordered are  listed, but only abnormal results are displayed) Labs Reviewed  BASIC METABOLIC PANEL - Abnormal; Notable for the following components:      Result Value   Sodium 131 (*)    Chloride 97 (*)    CO2 20 (*)    Glucose, Bld 173 (*)    BUN 26 (*)    Creatinine, Ser 1.62 (*)    GFR calc non Af Amer 29 (*)    GFR calc Af Amer 34 (*)    All other components within normal limits  CBC - Abnormal; Notable for the following components:   WBC 15.4 (*)    Platelets 433 (*)    All other components within normal limits  URINALYSIS, ROUTINE W REFLEX MICROSCOPIC - Abnormal; Notable for the following components:   Color, Urine AMBER (*)    APPearance CLOUDY (*)    Protein, ur 30 (*)    Leukocytes, UA MODERATE (*)    WBC, UA >50 (*)    Bacteria, UA RARE (*)    All other components within normal limits  CBG MONITORING, ED - Abnormal; Notable for the following components:   Glucose-Capillary 148 (*)    All other components within normal limits  URINE CULTURE    EKG None  Radiology Dg Thoracic Spine 2 View  Result Date: 05/08/2018 CLINICAL DATA:  Initial evaluation for acute trauma, fall. EXAM: THORACIC SPINE 2 VIEWS COMPARISON:  None available. FINDINGS: Vertebral bodies normally aligned with preservation of the normal thoracic kyphosis. No listhesis or malalignment. Mild chronic wedging with superimposed Schmorl's node at the  superior endplate of Y38, stable. Vertebral body heights otherwise maintained without acute or chronic fracture. Mild multilevel degenerative changes noted throughout the thoracic spine. Bilateral shoulder arthroplasties noted. Visualized lungs grossly clear. IMPRESSION: No radiographic evidence for acute traumatic injury within the thoracic spine. Electronically Signed   By: Jeannine Boga M.D.   On: 05/08/2018 01:21   Dg Lumbar Spine Complete  Result Date: 05/08/2018 CLINICAL DATA:  Initial evaluation for acute lower back pain status post fall. EXAM: LUMBAR SPINE -  COMPLETE 4+ VIEW COMPARISON:  Prior radiograph from 01/13/2017. FINDINGS: Five non rib-bearing lumbar type vertebral bodies. Levoscoliosis with apex at L4. Trace retrolisthesis of L1 on L2, stable. Alignment otherwise within normal limits. Mild chronic wedging of the T12 vertebral body is stable. No other acute or chronic fracture. Moderate to advanced multilevel degenerative spondylolysis, grossly similar. No acute soft tissue abnormality. Prominent aortic atherosclerosis noted. Moderate retained stool within the right colon. IMPRESSION: 1. No radiographic evidence for acute traumatic injury within the lumbar spine. 2. Levoscoliosis with moderate to advanced multilevel degenerative spondylolysis, grossly similar to previous. 3. Prominent aortic atherosclerosis. Electronically Signed   By: Jeannine Boga M.D.   On: 05/08/2018 01:17   Dg Ankle Complete Right  Result Date: 05/08/2018 CLINICAL DATA:  Initial evaluation for acute pain status post fall. EXAM: RIGHT ANKLE - COMPLETE 3+ VIEW COMPARISON:  None. FINDINGS: Small osseous fragment seen at the distal aspect of the medial malleolus, somewhat age indeterminate, but could reflect a small avulsion fracture. No other acute fracture dislocation. Ankle mortise approximated. Talar dome intact. No significant soft tissue swelling about the ankle. IMPRESSION: 1. Small osseous fragment at the distal aspect of the medial malleolus, somewhat age indeterminate, but could reflect a small avulsion fracture. Correlation with physical exam recommended. 2. No other acute osseous abnormality about the ankle. Electronically Signed   By: Jeannine Boga M.D.   On: 05/08/2018 01:12   Ct Head Wo Contrast  Result Date: 05/08/2018 CLINICAL DATA:  Dizziness since Friday resulting in fall. Patient hit back of head without loss consciousness. Patient found to have orthostatic hypotension. EXAM: CT HEAD WITHOUT CONTRAST CT CERVICAL SPINE WITHOUT CONTRAST TECHNIQUE:  Multidetector CT imaging of the head and cervical spine was performed following the standard protocol without intravenous contrast. Multiplanar CT image reconstructions of the cervical spine were also generated. COMPARISON:  None. FINDINGS: CT HEAD FINDINGS Brain: Age related cerebral atrophy with chronic stable small vessel ischemia. No intra-axial mass, midline shift or edema. No new large vascular territory infarct. No extra-axial fluid collections. No hydrocephalus. No effacement of the basal cisterns. Midline fourth ventricle. The brainstem and cerebellum are nonacute. Vascular: Atherosclerosis of the carotid siphons. No hyperdense vessel sign. Skull: Intact without skull fracture. Sinuses/Orbits: Chronic right maxillary sinus mucosal thickening with calcified desiccated mucus noted within. Mild ethmoid sinus mucosal thickening. Bilateral lens replacements. Mastoids are clear. Other: None. CT CERVICAL SPINE FINDINGS Alignment: Mild reversal cervical lordosis at C5-6 likely on the basis of degenerative disc disease and facet arthropathy. Skull base and vertebrae: No acute skull base fracture. No acute cervical spine fracture or suspicious osseous lesions. Intact atlantodental interval. Soft tissues and spinal canal: No prevertebral fluid or swelling. No visible canal hematoma. Disc levels: Multilevel degenerative disc disease at C3-4 and from C5 through T1. No significant central foraminal encroachment. Multilevel degenerative facet arthropathy. Upper chest: Unremarkable. Minimal scarring at the left lung apex. Other: Extracranial carotid arteriosclerosis bilaterally. IMPRESSION: 1. Age related cerebral atrophy with chronic small vessel ischemia.  No acute intracranial abnormality. 2. Cervical spondylosis without acute cervical spine fracture or posttraumatic listhesis. Electronically Signed   By: Ashley Royalty M.D.   On: 05/08/2018 01:51   Ct Cervical Spine Wo Contrast  Result Date: 05/08/2018 CLINICAL DATA:   Dizziness since Friday resulting in fall. Patient hit back of head without loss consciousness. Patient found to have orthostatic hypotension. EXAM: CT HEAD WITHOUT CONTRAST CT CERVICAL SPINE WITHOUT CONTRAST TECHNIQUE: Multidetector CT imaging of the head and cervical spine was performed following the standard protocol without intravenous contrast. Multiplanar CT image reconstructions of the cervical spine were also generated. COMPARISON:  None. FINDINGS: CT HEAD FINDINGS Brain: Age related cerebral atrophy with chronic stable small vessel ischemia. No intra-axial mass, midline shift or edema. No new large vascular territory infarct. No extra-axial fluid collections. No hydrocephalus. No effacement of the basal cisterns. Midline fourth ventricle. The brainstem and cerebellum are nonacute. Vascular: Atherosclerosis of the carotid siphons. No hyperdense vessel sign. Skull: Intact without skull fracture. Sinuses/Orbits: Chronic right maxillary sinus mucosal thickening with calcified desiccated mucus noted within. Mild ethmoid sinus mucosal thickening. Bilateral lens replacements. Mastoids are clear. Other: None. CT CERVICAL SPINE FINDINGS Alignment: Mild reversal cervical lordosis at C5-6 likely on the basis of degenerative disc disease and facet arthropathy. Skull base and vertebrae: No acute skull base fracture. No acute cervical spine fracture or suspicious osseous lesions. Intact atlantodental interval. Soft tissues and spinal canal: No prevertebral fluid or swelling. No visible canal hematoma. Disc levels: Multilevel degenerative disc disease at C3-4 and from C5 through T1. No significant central foraminal encroachment. Multilevel degenerative facet arthropathy. Upper chest: Unremarkable. Minimal scarring at the left lung apex. Other: Extracranial carotid arteriosclerosis bilaterally. IMPRESSION: 1. Age related cerebral atrophy with chronic small vessel ischemia. No acute intracranial abnormality. 2. Cervical  spondylosis without acute cervical spine fracture or posttraumatic listhesis. Electronically Signed   By: Ashley Royalty M.D.   On: 05/08/2018 01:51   Dg Knee Complete 4 Views Left  Result Date: 05/08/2018 CLINICAL DATA:  Initial evaluation for acute pain status post fall. EXAM: LEFT KNEE - COMPLETE 4+ VIEW COMPARISON:  None available. FINDINGS: No acute fracture dislocation. No joint effusion. Mild-to-moderate tricompartmental degenerative osteoarthrosis. Associated chondrocalcinosis noted. No discrete osseous lesions. No soft tissue abnormality. IMPRESSION: 1. No acute fracture or dislocation. 2. Mild to moderate tricompartmental degenerative osteoarthrosis with associated chondrocalcinosis. Electronically Signed   By: Jeannine Boga M.D.   On: 05/08/2018 01:13   Dg Knee Complete 4 Views Right  Result Date: 05/08/2018 CLINICAL DATA:  Initial evaluation for acute trauma, fall. EXAM: RIGHT KNEE - COMPLETE 4+ VIEW COMPARISON:  None. FINDINGS: Right total knee arthroplasty in place. No appreciable hardware complication. No acute fracture dislocation. No joint effusion. No appreciable soft tissue injury. IMPRESSION: 1. No acute osseous abnormality about the right knee. 2. Right total knee arthroplasty in place without complication. Electronically Signed   By: Jeannine Boga M.D.   On: 05/08/2018 01:15    Procedures Procedures (including critical care time)  Medications Ordered in ED Medications  sodium chloride 0.9 % bolus 1,000 mL (has no administration in time range)  sodium chloride 0.9 % bolus 1,000 mL (0 mLs Intravenous Stopped 05/07/18 2358)  cefTRIAXone (ROCEPHIN) 1 g in sodium chloride 0.9 % 100 mL IVPB (0 g Intravenous Stopped 05/07/18 2332)     Initial Impression / Assessment and Plan / ED Course  I have reviewed the triage vital signs and the nursing notes.  Pertinent labs & imaging results  that were available during my care of the patient were reviewed by me and considered in my  medical decision making (see chart for details).     84 year old female with a history of rheumatoid arthritis, nephrolithiasis, diabetes mellitus, CVA, thyroid disease, and left breast cancer presenting with lightheadedness, recurrent falls over the last 4 days, malodorous urine, and a headache.  No fever or chills.  The patient was discussed and independently evaluated by Dr. Roxanne Mins, attending physician.  Labs are notable for mild hyponatremia of 131.  Creatinine is 1.62, up from 0.75 in May. BUN/CR 16:1, not a clear picture for prerenal azotemia..  She has a leukocytosis of 15. Thrombocytosis of 433, likely reactive.  UA with leukocyte esterase, pyuria, and mild proteinuria.  She has positive orthostatics and appears hypovolemic. Rocephin and IV fluids given.   Right ankle x-ray with small osseous fracture at the distal aspect of the medial malleolus that could represent a small avulsion fracture.  On exam, she has point tenderness over the medial malleolus on the right.  Will order short leg splint.  CT head is negative.  Cervical spine with cervical spondylosis but no acute cervical spine fracture.  All other x-rays are negative for acute fracture.  Consult to the hospitalist team and spoke with Dr. Hal Hope who will accept the patient for admission. The patient appears reasonably stabilized for admission considering the current resources, flow, and capabilities available in the ED at this time, and I doubt any other [facility] requiring further screening and/or treatment in the ED prior to admission.  Final Clinical Impressions(s) / ED Diagnoses   Final diagnoses:  Orthostatic hypotension  Urinary tract infection without hematuria, site unspecified  Closed avulsion fracture of medial malleolus of right tibia, initial encounter  AKI (acute kidney injury) [facility])  Hypovolemia    ED Discharge Orders    None       Joanne Gavel, PA-C 62/86/38 [id number]    Delora Fuel, MD 16/57/90 [phone]

## 2018-05-07 NOTE — Patient Instructions (Signed)
Your blood pressure is falling very low when you stand up, you need to go to the emergency room for evaluation.

## 2018-05-08 ENCOUNTER — Emergency Department (HOSPITAL_COMMUNITY): Payer: Medicare HMO

## 2018-05-08 ENCOUNTER — Other Ambulatory Visit: Payer: Self-pay

## 2018-05-08 ENCOUNTER — Encounter (HOSPITAL_COMMUNITY): Payer: Self-pay | Admitting: Internal Medicine

## 2018-05-08 DIAGNOSIS — R42 Dizziness and giddiness: Secondary | ICD-10-CM | POA: Diagnosis not present

## 2018-05-08 DIAGNOSIS — N179 Acute kidney failure, unspecified: Secondary | ICD-10-CM

## 2018-05-08 DIAGNOSIS — M25562 Pain in left knee: Secondary | ICD-10-CM | POA: Diagnosis not present

## 2018-05-08 DIAGNOSIS — S299XXA Unspecified injury of thorax, initial encounter: Secondary | ICD-10-CM | POA: Diagnosis not present

## 2018-05-08 DIAGNOSIS — S0990XA Unspecified injury of head, initial encounter: Secondary | ICD-10-CM | POA: Diagnosis not present

## 2018-05-08 DIAGNOSIS — M25571 Pain in right ankle and joints of right foot: Secondary | ICD-10-CM | POA: Diagnosis not present

## 2018-05-08 DIAGNOSIS — I951 Orthostatic hypotension: Secondary | ICD-10-CM | POA: Diagnosis not present

## 2018-05-08 DIAGNOSIS — N390 Urinary tract infection, site not specified: Secondary | ICD-10-CM | POA: Diagnosis not present

## 2018-05-08 DIAGNOSIS — S99911A Unspecified injury of right ankle, initial encounter: Secondary | ICD-10-CM | POA: Diagnosis not present

## 2018-05-08 DIAGNOSIS — S3992XA Unspecified injury of lower back, initial encounter: Secondary | ICD-10-CM | POA: Diagnosis not present

## 2018-05-08 DIAGNOSIS — M545 Low back pain: Secondary | ICD-10-CM | POA: Diagnosis not present

## 2018-05-08 DIAGNOSIS — S199XXA Unspecified injury of neck, initial encounter: Secondary | ICD-10-CM | POA: Diagnosis not present

## 2018-05-08 DIAGNOSIS — S8991XA Unspecified injury of right lower leg, initial encounter: Secondary | ICD-10-CM | POA: Diagnosis not present

## 2018-05-08 DIAGNOSIS — S8992XA Unspecified injury of left lower leg, initial encounter: Secondary | ICD-10-CM | POA: Diagnosis not present

## 2018-05-08 HISTORY — DX: Orthostatic hypotension: I95.1

## 2018-05-08 LAB — CBC WITH DIFFERENTIAL/PLATELET
Abs Immature Granulocytes: 0.05 10*3/uL (ref 0.00–0.07)
Basophils Absolute: 0.1 10*3/uL (ref 0.0–0.1)
Basophils Relative: 0 %
EOS ABS: 0.4 10*3/uL (ref 0.0–0.5)
Eosinophils Relative: 3 %
HEMATOCRIT: 39.7 % (ref 36.0–46.0)
Hemoglobin: 12.2 g/dL (ref 12.0–15.0)
Immature Granulocytes: 0 %
LYMPHS ABS: 2.8 10*3/uL (ref 0.7–4.0)
Lymphocytes Relative: 19 %
MCH: 27.6 pg (ref 26.0–34.0)
MCHC: 30.7 g/dL (ref 30.0–36.0)
MCV: 89.8 fL (ref 80.0–100.0)
Monocytes Absolute: 1.2 10*3/uL — ABNORMAL HIGH (ref 0.1–1.0)
Monocytes Relative: 9 %
NRBC: 0 % (ref 0.0–0.2)
Neutro Abs: 9.8 10*3/uL — ABNORMAL HIGH (ref 1.7–7.7)
Neutrophils Relative %: 69 %
Platelets: 334 10*3/uL (ref 150–400)
RBC: 4.42 MIL/uL (ref 3.87–5.11)
RDW: 14.1 % (ref 11.5–15.5)
WBC: 14.2 10*3/uL — ABNORMAL HIGH (ref 4.0–10.5)

## 2018-05-08 LAB — COMPREHENSIVE METABOLIC PANEL
ALT: 14 U/L (ref 0–44)
AST: 21 U/L (ref 15–41)
Albumin: 3.4 g/dL — ABNORMAL LOW (ref 3.5–5.0)
Alkaline Phosphatase: 85 U/L (ref 38–126)
Anion gap: 14 (ref 5–15)
BUN: 26 mg/dL — ABNORMAL HIGH (ref 8–23)
CO2: 19 mmol/L — ABNORMAL LOW (ref 22–32)
Calcium: 8.6 mg/dL — ABNORMAL LOW (ref 8.9–10.3)
Chloride: 101 mmol/L (ref 98–111)
Creatinine, Ser: 1.4 mg/dL — ABNORMAL HIGH (ref 0.44–1.00)
GFR calc Af Amer: 41 mL/min — ABNORMAL LOW (ref >60)
GFR calc non Af Amer: 35 mL/min — ABNORMAL LOW (ref >60)
Glucose, Bld: 146 mg/dL — ABNORMAL HIGH (ref 70–99)
Potassium: 3.9 mmol/L (ref 3.5–5.1)
Sodium: 134 mmol/L — ABNORMAL LOW (ref 135–145)
Total Bilirubin: 0.5 mg/dL (ref 0.3–1.2)
Total Protein: 6.4 g/dL — ABNORMAL LOW (ref 6.5–8.1)

## 2018-05-08 LAB — CBG MONITORING, ED
GLUCOSE-CAPILLARY: 139 mg/dL — AB (ref 70–99)
Glucose-Capillary: 130 mg/dL — ABNORMAL HIGH (ref 70–99)

## 2018-05-08 LAB — GLUCOSE, CAPILLARY
Glucose-Capillary: 124 mg/dL — ABNORMAL HIGH (ref 70–99)
Glucose-Capillary: 186 mg/dL — ABNORMAL HIGH (ref 70–99)

## 2018-05-08 LAB — TSH: TSH: 4.924 u[IU]/mL — ABNORMAL HIGH (ref 0.350–4.500)

## 2018-05-08 LAB — TROPONIN I: Troponin I: <0.03 ng/mL (ref <0.03)

## 2018-05-08 LAB — CORTISOL: Cortisol, Plasma: 8.5 ug/dL

## 2018-05-08 LAB — CK: Total CK: 67 U/L (ref 38–234)

## 2018-05-08 MED ORDER — PROPRANOLOL HCL 40 MG PO TABS
40.0000 mg | ORAL_TABLET | Freq: Every day | ORAL | Status: DC
  Filled 2018-05-08: qty 1

## 2018-05-08 MED ORDER — SODIUM CHLORIDE 0.9 % IV SOLN
INTRAVENOUS | Status: DC
  Administered 2018-05-08 – 2018-05-09 (×2): via INTRAVENOUS

## 2018-05-08 MED ORDER — MECLIZINE HCL 25 MG PO TABS: 25.0000 mg | ORAL_TABLET | Freq: Two times a day (BID) | ORAL | Status: DC | PRN

## 2018-05-08 MED ORDER — SODIUM CHLORIDE 0.9 % IV SOLN
1.0000 g | INTRAVENOUS | Status: DC
  Filled 2018-05-08: qty 10

## 2018-05-08 MED ORDER — ONDANSETRON HCL 4 MG PO TABS: 4.0000 mg | ORAL_TABLET | Freq: Four times a day (QID) | ORAL | Status: DC | PRN

## 2018-05-08 MED ORDER — ASPIRIN EC 81 MG PO TBEC
81.0000 mg | DELAYED_RELEASE_TABLET | Freq: Every day | ORAL | Status: DC
  Administered 2018-05-08 – 2018-05-11 (×4): 81 mg via ORAL
  Filled 2018-05-08 (×4): qty 1

## 2018-05-08 MED ORDER — DORZOLAMIDE HCL-TIMOLOL MAL 2-0.5 % OP SOLN
1.0000 [drp] | Freq: Two times a day (BID) | OPHTHALMIC | Status: DC
  Administered 2018-05-08 – 2018-05-11 (×6): 1 [drp] via OPHTHALMIC
  Filled 2018-05-08 (×2): qty 10

## 2018-05-08 MED ORDER — BRIMONIDINE TARTRATE 0.2 % OP SOLN
1.0000 [drp] | Freq: Three times a day (TID) | OPHTHALMIC | Status: DC
  Administered 2018-05-08 – 2018-05-11 (×9): 1 [drp] via OPHTHALMIC
  Filled 2018-05-08 (×2): qty 5

## 2018-05-08 MED ORDER — DONEPEZIL HCL 10 MG PO TABS
10.0000 mg | ORAL_TABLET | Freq: Every day | ORAL | Status: DC
  Administered 2018-05-09 – 2018-05-11 (×3): 10 mg via ORAL
  Filled 2018-05-08 (×4): qty 1

## 2018-05-08 MED ORDER — DULOXETINE HCL 60 MG PO CPEP
60.0000 mg | ORAL_CAPSULE | Freq: Every day | ORAL | Status: DC
  Administered 2018-05-09 – 2018-05-11 (×3): 60 mg via ORAL
  Filled 2018-05-08 (×4): qty 1

## 2018-05-08 MED ORDER — ROSUVASTATIN CALCIUM 5 MG PO TABS
10.0000 mg | ORAL_TABLET | Freq: Every day | ORAL | Status: DC
  Administered 2018-05-08 – 2018-05-10 (×3): 10 mg via ORAL
  Filled 2018-05-08 (×4): qty 2

## 2018-05-08 MED ORDER — ACETAMINOPHEN 650 MG RE SUPP: 650.0000 mg | Freq: Four times a day (QID) | RECTAL | Status: DC | PRN

## 2018-05-08 MED ORDER — LATANOPROST 0.005 % OP SOLN
1.0000 [drp] | Freq: Every day | OPHTHALMIC | Status: DC
  Administered 2018-05-08 – 2018-05-10 (×3): 1 [drp] via OPHTHALMIC
  Filled 2018-05-08: qty 2.5

## 2018-05-08 MED ORDER — ANASTROZOLE 1 MG PO TABS
1.0000 mg | ORAL_TABLET | Freq: Every day | ORAL | Status: DC
  Administered 2018-05-08 – 2018-05-11 (×4): 1 mg via ORAL
  Filled 2018-05-08 (×5): qty 1

## 2018-05-08 MED ORDER — TRAZODONE HCL 50 MG PO TABS: 100.0000 mg | ORAL_TABLET | Freq: Every evening | ORAL | Status: DC | PRN

## 2018-05-08 MED ORDER — SODIUM CHLORIDE 0.9 % IV BOLUS
1000.0000 mL | Freq: Once | INTRAVENOUS | Status: AC
  Administered 2018-05-08: 1000 mL via INTRAVENOUS

## 2018-05-08 MED ORDER — GABAPENTIN 400 MG PO CAPS
800.0000 mg | ORAL_CAPSULE | Freq: Three times a day (TID) | ORAL | Status: DC
  Administered 2018-05-08 – 2018-05-11 (×10): 800 mg via ORAL
  Filled 2018-05-08 (×10): qty 2

## 2018-05-08 MED ORDER — INSULIN ASPART 100 UNIT/ML ~~LOC~~ SOLN
0.0000 [IU] | Freq: Three times a day (TID) | SUBCUTANEOUS | Status: DC
  Administered 2018-05-08 – 2018-05-09 (×5): 1 [IU] via SUBCUTANEOUS
  Administered 2018-05-09: 3 [IU] via SUBCUTANEOUS
  Administered 2018-05-10: 2 [IU] via SUBCUTANEOUS
  Administered 2018-05-10: 3 [IU] via SUBCUTANEOUS
  Administered 2018-05-10: 1 [IU] via SUBCUTANEOUS
  Administered 2018-05-11: 2 [IU] via SUBCUTANEOUS

## 2018-05-08 MED ORDER — LEVOTHYROXINE SODIUM 112 MCG PO TABS
112.0000 ug | ORAL_TABLET | Freq: Every day | ORAL | Status: DC
  Administered 2018-05-08 – 2018-05-09 (×2): 112 ug via ORAL
  Filled 2018-05-08 (×3): qty 1

## 2018-05-08 MED ORDER — ACETAMINOPHEN 325 MG PO TABS: 650.0000 mg | ORAL_TABLET | Freq: Four times a day (QID) | ORAL | Status: DC | PRN

## 2018-05-08 MED ORDER — ENOXAPARIN SODIUM 40 MG/0.4ML ~~LOC~~ SOLN
40.0000 mg | Freq: Every day | SUBCUTANEOUS | Status: DC
  Administered 2018-05-08 – 2018-05-11 (×4): 40 mg via SUBCUTANEOUS
  Filled 2018-05-08 (×5): qty 0.4

## 2018-05-08 MED ORDER — PANTOPRAZOLE SODIUM 40 MG PO TBEC
40.0000 mg | DELAYED_RELEASE_TABLET | Freq: Every day | ORAL | Status: DC
  Administered 2018-05-08 – 2018-05-11 (×4): 40 mg via ORAL
  Filled 2018-05-08 (×4): qty 1

## 2018-05-08 MED ORDER — ONDANSETRON HCL 4 MG/2ML IJ SOLN: 4.0000 mg | Freq: Four times a day (QID) | INTRAMUSCULAR | Status: DC | PRN

## 2018-05-08 NOTE — Progress Notes (Signed)
Orthopedic Tech Progress Note Patient Details:  Shannon Higgins 1935/04/12 [id number]  Ortho Devices Type of Ortho Device: Short leg splint Ortho Device/Splint Interventions: Adjustment, Application, Ordered   Post Interventions Patient Tolerated: Well Instructions Provided: Care of device, Adjustment of device   Melony Overly T 05/08/2018, 3:19 AM

## 2018-05-08 NOTE — Progress Notes (Signed)
Pt received to room 3e08 from Ed, daughter at bedisde, vss, denies pain, right foot with splint and ace wrap, foot swollen with cap refill, unable to check foot pulse, strong popliteal pulse, pt unable to feel but also on left foot, dtr report neuropathy and that is not new, able to wiggle toes, right leg elevated on pillow, iv was not infusing with maybe 50-100 ml used in bag, flushed iv and attached ivf to pump, noted iv in left arm not in LDA of epic, dtr says  It swelled last night so they put in new iv, pt c/o burning with flush , removed 18g iv from lua tip intact, reviewed poc and orders.

## 2018-05-08 NOTE — ED Notes (Signed)
Pt CBG 130. Notified Emmy, Therapist, sports.

## 2018-05-08 NOTE — Evaluation (Signed)
Physical Therapy Evaluation Patient Details Name: Shannon Higgins. Shannon Higgins MRN: [id number] DOB: November 27, 1934 Today's Date: 05/08/2018   History of Present Illness  Shannon Jolly. Shannon Higgins is a 84 y.o. female with history of diabetes mellitus, hypertension, neuropathic pain, sleep apnea, depression, breast cancer in remission, recurrent UTIs admitted due to orthostatic hypotension and fall with injury to R foot.  Clinical Impression  Patient presents with decreased mobility due to pain and NWB R LE, decreased strength, decreased activity tolerance with orthostatic hypotension (this session BP 122/66 supine, 138/81 sitting) and decreased balance requring max A for sit to stand with NWB R LE.  Feel she will need skilled PT in the acute setting and follow up SNF level rehab prior to d/c home. I have discussed options with pt and daughter and both feel her needs would best be met in post acute inpatient rehab setting.     Follow Up Recommendations SNF;Supervision/Assistance - 24 hour    Equipment Recommendations  None recommended by PT    Recommendations for Other Services       Precautions / Restrictions Precautions Precautions: Fall Precaution Comments: watch BP Required Braces or Orthoses: Splint/Cast Splint/Cast: R foot Splint/Cast - Date Prophylactic Dressing Applied (if applicable): 49/82/64 Restrictions Weight Bearing Restrictions: Yes RLE Weight Bearing: Non weight bearing      Mobility  Bed Mobility Overal bed mobility: Needs Assistance Bed Mobility: Supine to Sit;Sit to Supine     Supine to sit: Min assist;HOB elevated Sit to supine: Min assist   General bed mobility comments: assist to lift trunk, to supine assist for positioning R leg  Transfers Overall transfer level: Needs assistance Equipment used: Rolling walker (2 wheeled) Transfers: Sit to/from [facility] Sit to Stand: Max assist;From elevated surface Stand pivot transfers: Mod assist       General  transfer comment: heavy lifting assist and increased time to get balanced on L LE, knee shaky in standing, pivot to [facility] with RW and despite cues still placing R foot on floor for transfer; able to pivot back to bed with RW and max cues for NWB  Ambulation/Gait                Stairs            Wheelchair Mobility    Modified Rankin (Stroke Patients Only)       Balance Overall balance assessment: Needs assistance   Sitting balance-Leahy Scale: Fair     Standing balance support: Bilateral upper extremity supported Standing balance-Leahy Scale: Poor Standing balance comment: UE support and assist for balance with NWB R LE                             Pertinent Vitals/Pain Pain Assessment: Faces Faces Pain Scale: Hurts little more Pain Location: R foot with dependency, L knee Pain Descriptors / Indicators: Sore Pain Intervention(s): Monitored during session;Repositioned;Limited activity within patient's tolerance    Home Living Family/patient expects to be discharged to:: Private residence Living Arrangements: Alone Available Help at Discharge: Family;Available PRN/intermittently(daughter and son in law) Type of Home: House Home Access: Stairs to enter   [employer] of Steps: 2-3 Home Layout: One level Home Equipment: Walker - 2 wheels;Bedside commode;Shower seat;Wheelchair - manual      Prior Function Level of Independence: Independent with assistive device(s)         Comments: recently using walker since got weaker after Christmas, family stand by for showers  Hand Dominance        Extremity/Trunk Assessment   Upper Extremity Assessment Upper Extremity Assessment: Generalized weakness    Lower Extremity Assessment Lower Extremity Assessment: RLE deficits/detail;LLE deficits/detail RLE Deficits / Details: splint on foot, can wiggle toes and lift leg on her own,  RLE Sensation: history of peripheral neuropathy LLE  Deficits / Details: knee painful and slightly swollen, moves antigravity without difficulty, but weakness evident with transfer LLE Sensation: history of peripheral neuropathy       Communication   Communication: No difficulties  Cognition Arousal/Alertness: Awake/alert Behavior During Therapy: WFL for tasks assessed/performed Overall Cognitive Status: Within Functional Limits for tasks assessed                                        General Comments General comments (skin integrity, edema, etc.): daughter in the room and supportive    Exercises     Assessment/Plan    PT Assessment Patient needs continued PT services  PT Problem List Decreased strength;Decreased balance;Decreased mobility;Decreased knowledge of precautions;Decreased safety awareness;Decreased knowledge of use of DME;Decreased range of motion       PT Treatment Interventions DME instruction;Therapeutic exercise;Balance training;Functional mobility training;Therapeutic activities;Patient/family education;Wheelchair mobility training    PT Goals (Current goals can be found in the Care Plan section)  Acute Rehab PT Goals Patient Stated Goal: to go to rehab PT Goal Formulation: With patient/family Time For Goal Achievement: 05/22/18 Potential to Achieve Goals: Good    Frequency Min 3X/week   Barriers to discharge        Co-evaluation               AM-PAC PT "6 Clicks" Mobility  Outcome Measure Help needed turning from your back to your side while in a flat bed without using bedrails?: A Little Help needed moving from lying on your back to sitting on the side of a flat bed without using bedrails?: A Little Help needed moving to and from a bed to a chair (including a wheelchair)?: A Lot Help needed standing up from a chair using your arms (e.g., wheelchair or bedside chair)?: A Lot Help needed to walk in hospital room?: Total Help needed climbing 3-5 steps with a railing? : Total 6  Click Score: 12    End of Session Equipment Utilized During Treatment: Gait belt Activity Tolerance: Patient limited by fatigue Patient left: in bed;with call bell/phone within reach;with family/visitor present;with bed alarm set Nurse Communication: Mobility status PT Visit Diagnosis: Difficulty in walking, not elsewhere classified (R26.2);Pain;Muscle weakness (generalized) (M62.81) Pain - Right/Left: Right Pain - part of body: Ankle and joints of foot    Time: 4782-9562 PT Time Calculation (min) (ACUTE ONLY): 31 min   Charges:   PT Evaluation $PT Eval Moderate Complexity: 1 Mod PT Treatments $Therapeutic Activity: 8-22 mins        Shannon Higgins, [place] [facility] [phone] 05/08/2018   Shannon Higgins 05/08/2018, 4:52 PM

## 2018-05-08 NOTE — Progress Notes (Signed)
PROGRESS NOTE    Marcille Blanco. Orland Dec  ONG:[id number] DOB: [birth year] DOA: 05/07/2018 PCP: Pleas Koch, NP   Brief Narrative:  Marcille Blanco. Kemp is Caidence Higashi 84 y.o. female with history of diabetes mellitus, hypertension, neuropathic pain, sleep apnea, depression, breast cancer in remission, recurrent UTIs was brought to the ER after patient was found to be orthostatic in the PCPs office.  Patient states over the last 1 week patient had at least 5 falls.  Couple of days ago she did hit her head but she also had another fall yesterday when she hurt her leg.  Patient had gone to her PCP and over there patient was found to be orthostatic and was referred to the ER.  ED Course: In the ER patient was orthostatic with labs showing acute renal failure leukocytosis and UA consistent with UTI.  CT head and C-spine were unremarkable.  X-rays revealed right tibia medial malleolus fracture.  Splint is going to be placed into acute renal failure patient was given fluid bolus.  Patient admitted for orthostatic hypotension with falls and right leg medial malleolus fracture and possible UTI.  Ceftriaxone was placed.   Assessment & Plan:   Principal Problem:   Orthostatic hypotension Active Problems:   Diabetes mellitus (HCC)   HTN (hypertension)   OSA (obstructive sleep apnea)   AKI (acute kidney injury) ([place])   Hypothyroidism   MDD (major depressive disorder)   ARF (acute renal failure) ([place])   1. Orthostatic hypotension: leading to falls, will continue IVF and repeat orthostatic after pt is hydrated.  She was borderline orthostatic on presentation to ED. 1. AM cortisol 8.5, follow cort stim 1/8 2. Could be related to autonomic dysfunction related to dm?  Consider further w/u as indicated. 3. Follow EKG, monitor on tele 4. PT c/s  2. Acute Kidney Injury: seems to be improving with IVF, continue to monitor.  Follow renal US if not continuing to improve.  3. Falls  Right leg medial malleolus fracture  presently being placed on splint.  Falls presumed 2/2 orthostasis above.    1. Ortho c/s, recommending NWB to RLE, will need to follow up with ortho outpatient  4. Pyuria: she denies sx of UTI to me.  Will d/c abx and follow urine cx.  Discussed with pt to let us know if she develops sx.  5. Hypothyroidism on Synthroid.  TSH mildly elevated to ~4.9, follow outpatient  6. Diabetes mellitus type 2  1. Holding home metformin 2. SSI  7. Hypertension  1. Hold propanolol  8. Depression on Cymbalta.  9. Dementia on Aricept.  10. History of sleep apnea.  11. Mild hyponatremia likely from dehydration 1. Improving, follow  12. History of breast cancer on anastrozole.  13. Leukocytosis: likely reactive  DVT prophylaxis: lovenox Code Status: full  Family Communication: none at bedside Disposition Plan: pending   Consultants:   orthopedics  Procedures:  none  Antimicrobials:  Anti-infectives (From admission, onward)   Start     Dose/Rate Route Frequency Ordered Stop   05/08/18 2000  cefTRIAXone (ROCEPHIN) 1 g in sodium chloride 0.9 % 100 mL IVPB  Status:  Discontinued     1 g 200 mL/hr over 30 Minutes Intravenous Every 24 hours 05/08/18 0443 05/08/18 1713   05/07/18 2230  cefTRIAXone (ROCEPHIN) 1 g in sodium chloride 0.9 % 100 mL IVPB     1 g 200 mL/hr over 30 Minutes Intravenous  Once 05/07/18 2216 05/07/18 2332     Subjective: States that  she's had several falls over the past few days. All after getting up to standing and feeling lightheaded.  Denies UTI symptoms. Pain to R ankle.  Objective: Vitals:   05/08/18 0713 05/08/18 1007 05/08/18 1341 05/08/18 1513  BP: 106/61 106/63 108/64 106/60  Pulse: 78 76 76 72  Resp: (!) 21 17 19 20   Temp:    97.9 F (36.6 C)  TempSrc:    Oral  SpO2:  97% 97% 98%  Weight:    73.7 kg  Height:    5\' 4"  (1.626 m)    Intake/Output Summary (Last 24 hours) at 05/08/2018 1713 Last data filed at 05/08/2018 1600 Gross per 24 hour    Intake 2439.98 ml  Output 1300 ml  Net 1139.98 ml   Filed Weights   05/08/18 1513  Weight: 73.7 kg    Examination:  General exam: Appears calm and comfortable  Respiratory system: Clear to auscultation. Respiratory effort normal. Cardiovascular system: S1 & S2 heard, RRR Gastrointestinal system: Abdomen is nondistended, soft and nontender. No organomegaly or masses felt. Normal bowel sounds heard. Central nervous system: Alert and oriented. No focal neurological deficits. Extremities: RLE in splint Skin: No rashes, lesions or ulcers Psychiatry: Judgement and insight appear normal. Mood & affect appropriate.     Data Reviewed: I have personally reviewed following labs and imaging studies  CBC: Recent Labs  Lab 05/07/18 1735 05/08/18 0531  WBC 15.4* 14.2*  NEUTROABS  --  9.8*  HGB 13.9 12.2  HCT 44.0 39.7  MCV 91.5 89.8  PLT 433* 865   Basic Metabolic Panel: Recent Labs  Lab 05/07/18 1735 05/08/18 0531  NA 131* 134*  K 5.0 3.9  CL 97* 101  CO2 20* 19*  GLUCOSE 173* 146*  BUN 26* 26*  CREATININE 1.62* 1.40*  CALCIUM 9.7 8.6*   GFR: Estimated Creatinine Clearance: 31 mL/min (Somara Frymire) (by C-G formula based on SCr of 1.4 mg/dL (H)). Liver Function Tests: Recent Labs  Lab 05/08/18 0531  AST 21  ALT 14  ALKPHOS 85  BILITOT 0.5  PROT 6.4*  ALBUMIN 3.4*   No results for input(s): LIPASE, AMYLASE in the last 168 hours. No results for input(s): AMMONIA in the last 168 hours. Coagulation Profile: No results for input(s): INR, PROTIME in the last 168 hours. Cardiac Enzymes: Recent Labs  Lab 05/08/18 0531  CKTOTAL 37  TROPONINI <0.03   BNP (last 3 results) No results for input(s): PROBNP in the last 8760 hours. HbA1C: No results for input(s): HGBA1C in the last 72 hours. CBG: Recent Labs  Lab 05/07/18 2206 05/08/18 0742 05/08/18 1232 05/08/18 1632  GLUCAP 148* 130* 139* 124*   Lipid Profile: No results for input(s): CHOL, HDL, LDLCALC, TRIG,  CHOLHDL, LDLDIRECT in the last 72 hours. Thyroid Function Tests: Recent Labs    05/08/18 0531  TSH 4.924*   Anemia Panel: No results for input(s): VITAMINB12, FOLATE, FERRITIN, TIBC, IRON, RETICCTPCT in the last 72 hours. Sepsis Labs: No results for input(s): PROCALCITON, LATICACIDVEN in the last 168 hours.  No results found for this or any previous visit (from the past 240 hour(s)).       Radiology Studies: Dg Thoracic Spine 2 View  Result Date: 05/08/2018 CLINICAL DATA:  Initial evaluation for acute trauma, fall. EXAM: THORACIC SPINE 2 VIEWS COMPARISON:  None available. FINDINGS: Vertebral bodies normally aligned with preservation of the normal thoracic kyphosis. No listhesis or malalignment. Mild chronic wedging with superimposed Schmorl's node at the superior endplate of H84, stable. Vertebral  body heights otherwise maintained without acute or chronic fracture. Mild multilevel degenerative changes noted throughout the thoracic spine. Bilateral shoulder arthroplasties noted. Visualized lungs grossly clear. IMPRESSION: No radiographic evidence for acute traumatic injury within the thoracic spine. Electronically Signed   By: Jeannine Boga M.D.   On: 05/08/2018 01:21   Dg Lumbar Spine Complete  Result Date: 05/08/2018 CLINICAL DATA:  Initial evaluation for acute lower back pain status post fall. EXAM: LUMBAR SPINE - COMPLETE 4+ VIEW COMPARISON:  Prior radiograph from 01/13/2017. FINDINGS: Five non rib-bearing lumbar type vertebral bodies. Levoscoliosis with apex at L4. Trace retrolisthesis of L1 on L2, stable. Alignment otherwise within normal limits. Mild chronic wedging of the T12 vertebral body is stable. No other acute or chronic fracture. Moderate to advanced multilevel degenerative spondylolysis, grossly similar. No acute soft tissue abnormality. Prominent aortic atherosclerosis noted. Moderate retained stool within the right colon. IMPRESSION: 1. No radiographic evidence for  acute traumatic injury within the lumbar spine. 2. Levoscoliosis with moderate to advanced multilevel degenerative spondylolysis, grossly similar to previous. 3. Prominent aortic atherosclerosis. Electronically Signed   By: Jeannine Boga M.D.   On: 05/08/2018 01:17   Dg Ankle Complete Right  Result Date: 05/08/2018 CLINICAL DATA:  Initial evaluation for acute pain status post fall. EXAM: RIGHT ANKLE - COMPLETE 3+ VIEW COMPARISON:  None. FINDINGS: Small osseous fragment seen at the distal aspect of the medial malleolus, somewhat age indeterminate, but could reflect Chance Karam small avulsion fracture. No other acute fracture dislocation. Ankle mortise approximated. Talar dome intact. No significant soft tissue swelling about the ankle. IMPRESSION: 1. Small osseous fragment at the distal aspect of the medial malleolus, somewhat age indeterminate, but could reflect Jacques Fife small avulsion fracture. Correlation with physical exam recommended. 2. No other acute osseous abnormality about the ankle. Electronically Signed   By: Jeannine Boga M.D.   On: 05/08/2018 01:12   Ct Head Wo Contrast  Result Date: 05/08/2018 CLINICAL DATA:  Dizziness since Friday resulting in fall. Patient hit back of head without loss consciousness. Patient found to have orthostatic hypotension. EXAM: CT HEAD WITHOUT CONTRAST CT CERVICAL SPINE WITHOUT CONTRAST TECHNIQUE: Multidetector CT imaging of the head and cervical spine was performed following the standard protocol without intravenous contrast. Multiplanar CT image reconstructions of the cervical spine were also generated. COMPARISON:  None. FINDINGS: CT HEAD FINDINGS Brain: Age related cerebral atrophy with chronic stable small vessel ischemia. No intra-axial mass, midline shift or edema. No new large vascular territory infarct. No extra-axial fluid collections. No hydrocephalus. No effacement of the basal cisterns. Midline fourth ventricle. The brainstem and cerebellum are nonacute.  Vascular: Atherosclerosis of the carotid siphons. No hyperdense vessel sign. Skull: Intact without skull fracture. Sinuses/Orbits: Chronic right maxillary sinus mucosal thickening with calcified desiccated mucus noted within. Mild ethmoid sinus mucosal thickening. Bilateral lens replacements. Mastoids are clear. Other: None. CT CERVICAL SPINE FINDINGS Alignment: Mild reversal cervical lordosis at C5-6 likely on the basis of degenerative disc disease and facet arthropathy. Skull base and vertebrae: No acute skull base fracture. No acute cervical spine fracture or suspicious osseous lesions. Intact atlantodental interval. Soft tissues and spinal canal: No prevertebral fluid or swelling. No visible canal hematoma. Disc levels: Multilevel degenerative disc disease at C3-4 and from C5 through T1. No significant central foraminal encroachment. Multilevel degenerative facet arthropathy. Upper chest: Unremarkable. Minimal scarring at the left lung apex. Other: Extracranial carotid arteriosclerosis bilaterally. IMPRESSION: 1. Age related cerebral atrophy with chronic small vessel ischemia. No acute intracranial abnormality. 2. Cervical  spondylosis without acute cervical spine fracture or posttraumatic listhesis. Electronically Signed   By: Ashley Royalty M.D.   On: 05/08/2018 01:51   Ct Cervical Spine Wo Contrast  Result Date: 05/08/2018 CLINICAL DATA:  Dizziness since Friday resulting in fall. Patient hit back of head without loss consciousness. Patient found to have orthostatic hypotension. EXAM: CT HEAD WITHOUT CONTRAST CT CERVICAL SPINE WITHOUT CONTRAST TECHNIQUE: Multidetector CT imaging of the head and cervical spine was performed following the standard protocol without intravenous contrast. Multiplanar CT image reconstructions of the cervical spine were also generated. COMPARISON:  None. FINDINGS: CT HEAD FINDINGS Brain: Age related cerebral atrophy with chronic stable small vessel ischemia. No intra-axial mass,  midline shift or edema. No new large vascular territory infarct. No extra-axial fluid collections. No hydrocephalus. No effacement of the basal cisterns. Midline fourth ventricle. The brainstem and cerebellum are nonacute. Vascular: Atherosclerosis of the carotid siphons. No hyperdense vessel sign. Skull: Intact without skull fracture. Sinuses/Orbits: Chronic right maxillary sinus mucosal thickening with calcified desiccated mucus noted within. Mild ethmoid sinus mucosal thickening. Bilateral lens replacements. Mastoids are clear. Other: None. CT CERVICAL SPINE FINDINGS Alignment: Mild reversal cervical lordosis at C5-6 likely on the basis of degenerative disc disease and facet arthropathy. Skull base and vertebrae: No acute skull base fracture. No acute cervical spine fracture or suspicious osseous lesions. Intact atlantodental interval. Soft tissues and spinal canal: No prevertebral fluid or swelling. No visible canal hematoma. Disc levels: Multilevel degenerative disc disease at C3-4 and from C5 through T1. No significant central foraminal encroachment. Multilevel degenerative facet arthropathy. Upper chest: Unremarkable. Minimal scarring at the left lung apex. Other: Extracranial carotid arteriosclerosis bilaterally. IMPRESSION: 1. Age related cerebral atrophy with chronic small vessel ischemia. No acute intracranial abnormality. 2. Cervical spondylosis without acute cervical spine fracture or posttraumatic listhesis. Electronically Signed   By: Ashley Royalty M.D.   On: 05/08/2018 01:51   Dg Knee Complete 4 Views Left  Result Date: 05/08/2018 CLINICAL DATA:  Initial evaluation for acute pain status post fall. EXAM: LEFT KNEE - COMPLETE 4+ VIEW COMPARISON:  None available. FINDINGS: No acute fracture dislocation. No joint effusion. Mild-to-moderate tricompartmental degenerative osteoarthrosis. Associated chondrocalcinosis noted. No discrete osseous lesions. No soft tissue abnormality. IMPRESSION: 1. No acute  fracture or dislocation. 2. Mild to moderate tricompartmental degenerative osteoarthrosis with associated chondrocalcinosis. Electronically Signed   By: Jeannine Boga M.D.   On: 05/08/2018 01:13   Dg Knee Complete 4 Views Right  Result Date: 05/08/2018 CLINICAL DATA:  Initial evaluation for acute trauma, fall. EXAM: RIGHT KNEE - COMPLETE 4+ VIEW COMPARISON:  None. FINDINGS: Right total knee arthroplasty in place. No appreciable hardware complication. No acute fracture dislocation. No joint effusion. No appreciable soft tissue injury. IMPRESSION: 1. No acute osseous abnormality about the right knee. 2. Right total knee arthroplasty in place without complication. Electronically Signed   By: Jeannine Boga M.D.   On: 05/08/2018 01:15        Scheduled Meds: . anastrozole  1 mg Oral Daily  . aspirin EC  81 mg Oral Daily  . brimonidine  1 drop Both Eyes TID  . donepezil  10 mg Oral Daily  . dorzolamide-timolol  1 drop Both Eyes BID  . DULoxetine  60 mg Oral Daily  . enoxaparin (LOVENOX) injection  40 mg Subcutaneous Daily  . gabapentin  800 mg Oral TID  . insulin aspart  0-9 Units Subcutaneous TID WC  . latanoprost  1 drop Both Eyes QHS  . levothyroxine  112 mcg Oral Q0600  . pantoprazole  40 mg Oral Daily  . propranolol  40 mg Oral Daily  . rosuvastatin  10 mg Oral q1800   Continuous Infusions: . sodium chloride 100 mL/hr at 05/08/18 0700     LOS: 0 days    Time spent: over 30 min    Fayrene Helper, MD [facility] Pager [phone]  If 7PM-7AM, please contact night-coverage [email or web address] Password [facility] [employer] 05/08/2018, 5:13 PM

## 2018-05-08 NOTE — ED Notes (Signed)
bfast tray ordered 

## 2018-05-08 NOTE — H&P (Signed)
History and Physical    Suzanne Kho. Orland Dec UUE:[id number] DOB: 05/10/1934 DOA: 05/07/2018  PCP: Pleas Koch, NP  Patient coming from: Home.  Chief Complaint: Fall.  HPI: Nickolette Espinola. Worthington is a 84 y.o. female with history of diabetes mellitus, hypertension, neuropathic pain, sleep apnea, depression, breast cancer in remission, recurrent UTIs was brought to the ER after patient was found to be orthostatic in the PCPs office.  Patient states over the last 1 week patient had at least 5 falls.  Couple of days ago she did hit her head but she also had another fall yesterday when she hurt her leg.  Patient had gone to her PCP and over there patient was found to be orthostatic and was referred to the ER.  ED Course: In the ER patient was orthostatic with labs showing acute renal failure leukocytosis and UA consistent with UTI.  CT head and C-spine were unremarkable.  X-rays revealed right tibia medial malleolus fracture.  Splint is going to be placed into acute renal failure patient was given fluid bolus.  Patient admitted for orthostatic hypotension with falls and right leg medial malleolus fracture and possible UTI.  Ceftriaxone was placed.  Review of Systems: As per HPI, rest all negative.   Past Medical History:  Diagnosis Date  . Asthma   . Breast cancer ([place])   . Chronic sinusitis   . Diabetes mellitus without complication ([place])   . GERD (gastroesophageal reflux disease)   . History of recurrent UTIs   . Hypertension   . Insomnia   . Migraine   . Neuropathic pain   . Pneumonia   . Rheumatoid arthritis ([place])   . Sleep apnea   . Stroke ([place])   . Thyroid disease     Past Surgical History:  Procedure Laterality Date  . ABDOMINAL HYSTERECTOMY    . BACK SURGERY    . BREAST LUMPECTOMY    . CHOLECYSTECTOMY    . KNEE SURGERY    . SHOULDER SURGERY       reports that she has never smoked. She has never used smokeless tobacco. She reports that she does not drink alcohol or use  drugs.  No Known Allergies  Family History  Problem Relation Age of Onset  . Diabetes Daughter   . Hypertension Daughter   . Fibromyalgia Daughter   . GER disease Daughter   . Fibromyalgia Daughter   . Crohn's disease Daughter   . Asthma Daughter   . Hypertension Mother     Prior to Admission medications   Medication Sig Start Date End Date Taking? Authorizing Provider  amoxicillin (AMOXIL) 500 MG capsule Take 2,000 mg by mouth See admin instructions. Prior to appointment   Yes [provider]  anastrozole (ARIMIDEX) 1 MG tablet Take 1 mg by mouth daily.   Yes [provider]  aspirin 81 MG tablet Take 81 mg by mouth daily.   Yes [provider]  BRIMONIDINE TARTRATE OP Apply 1 drop to eye 3 (three) times daily. Both eyes   Yes [provider]  cetirizine (ZYRTEC) 10 MG chewable tablet Chew 1 tablet by mouth once daily as needed for allergies. 12/26/17  Yes Pleas Koch, NP  donepezil (ARICEPT) 10 MG tablet Take 1 tablet daily Patient taking differently: Take 10 mg by mouth daily.  02/23/18  Yes Cameron Sprang, MD  dorzolamide-timolol (COSOPT) 22.3-6.8 MG/ML ophthalmic solution Place 1 drop into both eyes 2 (two) times daily.   Yes [provider]  DULoxetine (CYMBALTA) 60 MG capsule Take 1 capsule by mouth once daily for depression. 12/26/17  Yes Pleas Koch, NP  fluticasone (FLONASE) 50 MCG/ACT nasal spray Place 2 sprays into both nostrils daily. Patient taking differently: Place 2 sprays into both nostrils daily as needed for allergies or rhinitis.  06/21/17  Yes Pleas Koch, NP  gabapentin (NEURONTIN) 800 MG tablet Take 1 capsule by mouth three times daily for neuropathy. 12/26/17  Yes Pleas Koch, NP  ibuprofen (ADVIL,MOTRIN) 200 MG tablet Take 600 mg by mouth every 6 (six) hours as needed for mild pain.   Yes [provider]  latanoprost (XALATAN) 0.005 % ophthalmic solution Place 1 drop into both eyes  at bedtime.   Yes [provider]  levothyroxine (SYNTHROID) 112 MCG tablet Take 1 tablet by mouth every morning on an empty stomach with a full glass of water. No food or other medications for 30 minutes. 12/27/17  Yes Pleas Koch, NP  meclizine (ANTIVERT) 25 MG tablet Take 1 tablet (25 mg total) by mouth 2 (two) times daily as needed for dizziness. 06/21/17  Yes Pleas Koch, NP  meloxicam (MOBIC) 15 MG tablet Take 1 tablet by mouth once daily as needed for pain. 12/26/17  Yes Pleas Koch, NP  metFORMIN (GLUCOPHAGE) 500 MG tablet Take 1 tablet by mouth twice daily with food for diabetes. 12/26/17  Yes Pleas Koch, NP  omeprazole (PRILOSEC) 20 MG capsule Take 1 capsule by mouth twice daily for heartburn. 12/26/17  Yes Pleas Koch, NP  propranolol (INDERAL) 80 MG tablet Take 1 tablet by mouth once daily for blood pressure. 12/26/17  Yes Pleas Koch, NP  rosuvastatin (CRESTOR) 10 MG tablet Take 1 tablet by mouth once daily for cholesterol. 12/27/17  Yes Pleas Koch, NP  SUMAtriptan (IMITREX) 25 MG tablet Take as needed for migraine. Do not take more than twice a week Patient taking differently: Take 25 mg by mouth daily as needed for migraine. Do not take more than twice a week 02/23/18  Yes Cameron Sprang, MD  traZODone (DESYREL) 100 MG tablet Take 100-200 mg by mouth at bedtime as needed for sleep.    Yes [provider]  blood glucose meter kit and supplies Dispense based on patient and insurance preference. Use up to 2 times daily as directed. (FOR ICD-10 E10.9, E11.9). 07/26/17   Pleas Koch, NP    Physical Exam: Vitals:   05/07/18 2345 05/08/18 0000 05/08/18 0015 05/08/18 0252  BP: (!) 111/59 (!) 109/59 (!) 123/59 (!) 106/54  Pulse: 68 67 70 75  Resp: (!) 21 16 17 18   Temp:      TempSrc:      SpO2: 96% 100% 97% 97%      Constitutional: Moderately built and nourished. Vitals:   05/07/18 2345 05/08/18 0000 05/08/18 0015  05/08/18 0252  BP: (!) 111/59 (!) 109/59 (!) 123/59 (!) 106/54  Pulse: 68 67 70 75  Resp: (!) 21 16 17 18   Temp:      TempSrc:      SpO2: 96% 100% 97% 97%   Eyes: Nonicteric no pallor. ENMT: No discharge from the ears eyes nose or mouth. Neck: No mass felt.  No neck rigidity.  No JVD appreciated. Respiratory: No rhonchi or crepitations. Cardiovascular: S1-S2 heard. Abdomen: Soft nontender bowel sounds present. Musculoskeletal: Pain on moving right leg. Skin: No rash. Neurologic: Alert awake oriented to time place and person.  Moves  all extremities. Psychiatric: Appears normal.   Labs on Admission: I have personally reviewed following labs and imaging studies  CBC: Recent Labs  Lab 05/07/18 1735  WBC 15.4*  HGB 13.9  HCT 44.0  MCV 91.5  PLT 009*   Basic Metabolic Panel: Recent Labs  Lab 05/07/18 1735  NA 131*  K 5.0  CL 97*  CO2 20*  GLUCOSE 173*  BUN 26*  CREATININE 1.62*  CALCIUM 9.7   GFR: CrCl cannot be calculated (Unknown ideal weight.). Liver Function Tests: No results for input(s): AST, ALT, ALKPHOS, BILITOT, PROT, ALBUMIN in the last 168 hours. No results for input(s): LIPASE, AMYLASE in the last 168 hours. No results for input(s): AMMONIA in the last 168 hours. Coagulation Profile: No results for input(s): INR, PROTIME in the last 168 hours. Cardiac Enzymes: No results for input(s): CKTOTAL, CKMB, CKMBINDEX, TROPONINI in the last 168 hours. BNP (last 3 results) No results for input(s): PROBNP in the last 8760 hours. HbA1C: No results for input(s): HGBA1C in the last 72 hours. CBG: Recent Labs  Lab 05/07/18 2206  GLUCAP 148*   Lipid Profile: No results for input(s): CHOL, HDL, LDLCALC, TRIG, CHOLHDL, LDLDIRECT in the last 72 hours. Thyroid Function Tests: No results for input(s): TSH, T4TOTAL, FREET4, T3FREE, THYROIDAB in the last 72 hours. Anemia Panel: No results for input(s): VITAMINB12, FOLATE, FERRITIN, TIBC, IRON, RETICCTPCT in the  last 72 hours. Urine analysis:    Component Value Date/Time   COLORURINE AMBER (A) 05/07/2018 2117   APPEARANCEUR CLOUDY (A) 05/07/2018 2117   LABSPEC 1.016 05/07/2018 2117   PHURINE 5.0 05/07/2018 2117   GLUCOSEU NEGATIVE 05/07/2018 2117   HGBUR NEGATIVE 05/07/2018 2117   BILIRUBINUR NEGATIVE 05/07/2018 2117   BILIRUBINUR 2+ 03/02/2018 [place] 05/07/2018 2117   PROTEINUR 30 (A) 05/07/2018 2117   UROBILINOGEN 1.0 03/02/2018 1239   UROBILINOGEN 0.2 04/26/2012 1547   NITRITE NEGATIVE 05/07/2018 2117   LEUKOCYTESUR MODERATE (A) 05/07/2018 2117   Sepsis Labs: @LABRCNTIP (procalcitonin:4,lacticidven:4) )No results found for this or any previous visit (from the past 240 hour(s)).   Radiological Exams on Admission: Dg Thoracic Spine 2 View  Result Date: 05/08/2018 CLINICAL DATA:  Initial evaluation for acute trauma, fall. EXAM: THORACIC SPINE 2 VIEWS COMPARISON:  None available. FINDINGS: Vertebral bodies normally aligned with preservation of the normal thoracic kyphosis. No listhesis or malalignment. Mild chronic wedging with superimposed Schmorl's node at the superior endplate of Q33, stable. Vertebral body heights otherwise maintained without acute or chronic fracture. Mild multilevel degenerative changes noted throughout the thoracic spine. Bilateral shoulder arthroplasties noted. Visualized lungs grossly clear. IMPRESSION: No radiographic evidence for acute traumatic injury within the thoracic spine. Electronically Signed   By: Jeannine Boga M.D.   On: 05/08/2018 01:21   Dg Lumbar Spine Complete  Result Date: 05/08/2018 CLINICAL DATA:  Initial evaluation for acute lower back pain status post fall. EXAM: LUMBAR SPINE - COMPLETE 4+ VIEW COMPARISON:  Prior radiograph from 01/13/2017. FINDINGS: Five non rib-bearing lumbar type vertebral bodies. Levoscoliosis with apex at L4. Trace retrolisthesis of L1 on L2, stable. Alignment otherwise within normal limits. Mild chronic  wedging of the T12 vertebral body is stable. No other acute or chronic fracture. Moderate to advanced multilevel degenerative spondylolysis, grossly similar. No acute soft tissue abnormality. Prominent aortic atherosclerosis noted. Moderate retained stool within the right colon. IMPRESSION: 1. No radiographic evidence for acute traumatic injury within the lumbar spine. 2. Levoscoliosis with moderate to advanced multilevel degenerative spondylolysis, grossly similar  to previous. 3. Prominent aortic atherosclerosis. Electronically Signed   By: Jeannine Boga M.D.   On: 05/08/2018 01:17   Dg Ankle Complete Right  Result Date: 05/08/2018 CLINICAL DATA:  Initial evaluation for acute pain status post fall. EXAM: RIGHT ANKLE - COMPLETE 3+ VIEW COMPARISON:  None. FINDINGS: Small osseous fragment seen at the distal aspect of the medial malleolus, somewhat age indeterminate, but could reflect a small avulsion fracture. No other acute fracture dislocation. Ankle mortise approximated. Talar dome intact. No significant soft tissue swelling about the ankle. IMPRESSION: 1. Small osseous fragment at the distal aspect of the medial malleolus, somewhat age indeterminate, but could reflect a small avulsion fracture. Correlation with physical exam recommended. 2. No other acute osseous abnormality about the ankle. Electronically Signed   By: Jeannine Boga M.D.   On: 05/08/2018 01:12   Ct Head Wo Contrast  Result Date: 05/08/2018 CLINICAL DATA:  Dizziness since Friday resulting in fall. Patient hit back of head without loss consciousness. Patient found to have orthostatic hypotension. EXAM: CT HEAD WITHOUT CONTRAST CT CERVICAL SPINE WITHOUT CONTRAST TECHNIQUE: Multidetector CT imaging of the head and cervical spine was performed following the standard protocol without intravenous contrast. Multiplanar CT image reconstructions of the cervical spine were also generated. COMPARISON:  None. FINDINGS: CT HEAD FINDINGS  Brain: Age related cerebral atrophy with chronic stable small vessel ischemia. No intra-axial mass, midline shift or edema. No new large vascular territory infarct. No extra-axial fluid collections. No hydrocephalus. No effacement of the basal cisterns. Midline fourth ventricle. The brainstem and cerebellum are nonacute. Vascular: Atherosclerosis of the carotid siphons. No hyperdense vessel sign. Skull: Intact without skull fracture. Sinuses/Orbits: Chronic right maxillary sinus mucosal thickening with calcified desiccated mucus noted within. Mild ethmoid sinus mucosal thickening. Bilateral lens replacements. Mastoids are clear. Other: None. CT CERVICAL SPINE FINDINGS Alignment: Mild reversal cervical lordosis at C5-6 likely on the basis of degenerative disc disease and facet arthropathy. Skull base and vertebrae: No acute skull base fracture. No acute cervical spine fracture or suspicious osseous lesions. Intact atlantodental interval. Soft tissues and spinal canal: No prevertebral fluid or swelling. No visible canal hematoma. Disc levels: Multilevel degenerative disc disease at C3-4 and from C5 through T1. No significant central foraminal encroachment. Multilevel degenerative facet arthropathy. Upper chest: Unremarkable. Minimal scarring at the left lung apex. Other: Extracranial carotid arteriosclerosis bilaterally. IMPRESSION: 1. Age related cerebral atrophy with chronic small vessel ischemia. No acute intracranial abnormality. 2. Cervical spondylosis without acute cervical spine fracture or posttraumatic listhesis. Electronically Signed   By: Ashley Royalty M.D.   On: 05/08/2018 01:51   Ct Cervical Spine Wo Contrast  Result Date: 05/08/2018 CLINICAL DATA:  Dizziness since Friday resulting in fall. Patient hit back of head without loss consciousness. Patient found to have orthostatic hypotension. EXAM: CT HEAD WITHOUT CONTRAST CT CERVICAL SPINE WITHOUT CONTRAST TECHNIQUE: Multidetector CT imaging of the head and  cervical spine was performed following the standard protocol without intravenous contrast. Multiplanar CT image reconstructions of the cervical spine were also generated. COMPARISON:  None. FINDINGS: CT HEAD FINDINGS Brain: Age related cerebral atrophy with chronic stable small vessel ischemia. No intra-axial mass, midline shift or edema. No new large vascular territory infarct. No extra-axial fluid collections. No hydrocephalus. No effacement of the basal cisterns. Midline fourth ventricle. The brainstem and cerebellum are nonacute. Vascular: Atherosclerosis of the carotid siphons. No hyperdense vessel sign. Skull: Intact without skull fracture. Sinuses/Orbits: Chronic right maxillary sinus mucosal thickening with calcified desiccated mucus noted within.  Mild ethmoid sinus mucosal thickening. Bilateral lens replacements. Mastoids are clear. Other: None. CT CERVICAL SPINE FINDINGS Alignment: Mild reversal cervical lordosis at C5-6 likely on the basis of degenerative disc disease and facet arthropathy. Skull base and vertebrae: No acute skull base fracture. No acute cervical spine fracture or suspicious osseous lesions. Intact atlantodental interval. Soft tissues and spinal canal: No prevertebral fluid or swelling. No visible canal hematoma. Disc levels: Multilevel degenerative disc disease at C3-4 and from C5 through T1. No significant central foraminal encroachment. Multilevel degenerative facet arthropathy. Upper chest: Unremarkable. Minimal scarring at the left lung apex. Other: Extracranial carotid arteriosclerosis bilaterally. IMPRESSION: 1. Age related cerebral atrophy with chronic small vessel ischemia. No acute intracranial abnormality. 2. Cervical spondylosis without acute cervical spine fracture or posttraumatic listhesis. Electronically Signed   By: Ashley Royalty M.D.   On: 05/08/2018 01:51   Dg Knee Complete 4 Views Left  Result Date: 05/08/2018 CLINICAL DATA:  Initial evaluation for acute pain status  post fall. EXAM: LEFT KNEE - COMPLETE 4+ VIEW COMPARISON:  None available. FINDINGS: No acute fracture dislocation. No joint effusion. Mild-to-moderate tricompartmental degenerative osteoarthrosis. Associated chondrocalcinosis noted. No discrete osseous lesions. No soft tissue abnormality. IMPRESSION: 1. No acute fracture or dislocation. 2. Mild to moderate tricompartmental degenerative osteoarthrosis with associated chondrocalcinosis. Electronically Signed   By: Jeannine Boga M.D.   On: 05/08/2018 01:13   Dg Knee Complete 4 Views Right  Result Date: 05/08/2018 CLINICAL DATA:  Initial evaluation for acute trauma, fall. EXAM: RIGHT KNEE - COMPLETE 4+ VIEW COMPARISON:  None. FINDINGS: Right total knee arthroplasty in place. No appreciable hardware complication. No acute fracture dislocation. No joint effusion. No appreciable soft tissue injury. IMPRESSION: 1. No acute osseous abnormality about the right knee. 2. Right total knee arthroplasty in place without complication. Electronically Signed   By: Jeannine Boga M.D.   On: 05/08/2018 01:15      Assessment/Plan Principal Problem:   Orthostatic hypotension Active Problems:   Diabetes mellitus ([facility])   HTN (hypertension)   OSA (obstructive sleep apnea)   AKI (acute kidney injury) ([place])   Hypothyroidism   MDD (major depressive disorder)   ARF (acute renal failure) ([place])    1. Orthostatic hypotension with acute renal failure -could be from prerenal cause.  Will gently hydrate and recheck orthostatics.  Check cortisol levels.  Get physical therapy consult. 2. Right leg medial malleolus fracture presently being placed on splint.  Consult orthopedics in the morning. 3. Possible UTI on ceftriaxone follow urine cultures. 4. Hypothyroidism on Synthroid. 5. Diabetes mellitus type 2 we will keep patient on sliding scale coverage for now. 6. Hypertension on propranolol which can be continued if patient is not hypotensive. 7. Depression on  Cymbalta. 8. Dementia on Aricept. 9. History of sleep apnea. 10. Mild hyponatremia likely from dehydration follow metabolic panel after hydration. 11. History of breast cancer on anastrozole.  Patient is EKG and CK levels are pending.   DVT prophylaxis: Lovenox. Code Status: Full code. Family Communication: No family at the bedside. Disposition Plan: To be determined. Consults called: None. Admission status: Observation.   Rise Patience MD [facility] Pager [phone].  If 7PM-7AM, please contact night-coverage [email or web address] Password [facility]  05/08/2018, 4:43 AM

## 2018-05-08 NOTE — Consult Note (Signed)
Reason for Consult:Ankle fx Referring Physician: A Alvester Morin [name] is an 84 y.o. female.  HPI: Shannon Higgins was at home and became dizzy yesterday. She fell and hurt her right ankle. She was unable to get up or bear weight and was brought to the ED for evaluation. She had fallen several times before that. X-rays showed a small medial malleolus fx of her right leg. She was splinted and orthopedic surgery was consulted the following day.  Past Medical History:  Diagnosis Date  . Asthma   . Breast cancer ([place])   . Chronic sinusitis   . Diabetes mellitus without complication ([place])   . GERD (gastroesophageal reflux disease)   . History of recurrent UTIs   . Hypertension   . Insomnia   . Migraine   . Neuropathic pain   . Pneumonia   . Rheumatoid arthritis ([place])   . Sleep apnea   . Stroke ([place])   . Thyroid disease     Past Surgical History:  Procedure Laterality Date  . ABDOMINAL HYSTERECTOMY    . BACK SURGERY    . BREAST LUMPECTOMY    . CHOLECYSTECTOMY    . KNEE SURGERY    . SHOULDER SURGERY      Family History  Problem Relation Age of Onset  . Diabetes Daughter   . Hypertension Daughter   . Fibromyalgia Daughter   . GER disease Daughter   . Fibromyalgia Daughter   . Crohn's disease Daughter   . Asthma Daughter   . Hypertension Mother     Social History:  reports that she has never smoked. She has never used smokeless tobacco. She reports that she does not drink alcohol or use drugs.  Allergies: No Known Allergies  Medications: I have reviewed the patient's current medications.  Results for orders placed or performed during the hospital encounter of 05/07/18 (from the past 48 hour(s))  Basic metabolic panel     Status: Abnormal   Collection Time: 05/07/18  5:35 PM  Result Value Ref Range   Sodium 131 (L) 135 - 145 mmol/L   Potassium 5.0 3.5 - 5.1 mmol/L    Comment: SLIGHT HEMOLYSIS   Chloride 97 (L) 98 - 111 mmol/L   CO2 20 (L) 22 - 32 mmol/L   Glucose,  Bld 173 (H) 70 - 99 mg/dL   BUN 26 (H) 8 - 23 mg/dL   Creatinine, Ser 1.62 (H) 0.44 - 1.00 mg/dL   Calcium 9.7 8.9 - 10.3 mg/dL   GFR calc non Af Amer 29 (L) >60 mL/min   GFR calc Af Amer 34 (L) >60 mL/min   Anion gap 14 5 - 15    Comment: Performed at [facility], [address] [place], [place] [address]  CBC     Status: Abnormal   Collection Time: 05/07/18  5:35 PM  Result Value Ref Range   WBC 15.4 (H) 4.0 - 10.5 K/uL   RBC 4.81 3.87 - 5.11 MIL/uL   Hemoglobin 13.9 12.0 - 15.0 g/dL   HCT 44.0 36.0 - 46.0 %   MCV 91.5 80.0 - 100.0 fL   MCH 28.9 26.0 - 34.0 pg   MCHC 31.6 30.0 - 36.0 g/dL   RDW 14.3 11.5 - 15.5 %   Platelets 433 (H) 150 - 400 K/uL   nRBC 0.0 0.0 - 0.2 %    Comment: Performed at [facility], [place] [address] [place], [place] [address]  Urinalysis, Routine w reflex microscopic  Status: Abnormal   Collection Time: 05/07/18  9:17 PM  Result Value Ref Range   Color, Urine AMBER (A) YELLOW    Comment: BIOCHEMICALS MAY BE AFFECTED BY COLOR   APPearance CLOUDY (A) CLEAR   Specific Gravity, Urine 1.016 1.005 - 1.030   pH 5.0 5.0 - 8.0   Glucose, UA NEGATIVE NEGATIVE mg/dL   Hgb urine dipstick NEGATIVE NEGATIVE   Bilirubin Urine NEGATIVE NEGATIVE   Ketones, ur NEGATIVE NEGATIVE mg/dL   Protein, ur 30 (A) NEGATIVE mg/dL   Nitrite NEGATIVE NEGATIVE   Leukocytes, UA MODERATE (A) NEGATIVE   RBC / HPF 0-5 0 - 5 RBC/hpf   WBC, UA >50 (H) 0 - 5 WBC/hpf   Bacteria, UA RARE (A) NONE SEEN   Squamous Epithelial / LPF 0-5 0 - 5   WBC Clumps PRESENT    Mucus PRESENT    Hyaline Casts, UA PRESENT     Comment: Performed at [facility], [address] [place], [place] [address]  CBG monitoring, ED     Status: Abnormal   Collection Time: 05/07/18 10:06 PM  Result Value Ref Range   Glucose-Capillary 148 (H) 70 - 99 mg/dL   Comment 1 Notify RN    Comment 2 Document in Chart   Comprehensive metabolic panel     Status: Abnormal   Collection Time:  05/08/18  5:31 AM  Result Value Ref Range   Sodium 134 (L) 135 - 145 mmol/L   Potassium 3.9 3.5 - 5.1 mmol/L   Chloride 101 98 - 111 mmol/L   CO2 19 (L) 22 - 32 mmol/L   Glucose, Bld 146 (H) 70 - 99 mg/dL   BUN 26 (H) 8 - 23 mg/dL   Creatinine, Ser 1.40 (H) 0.44 - 1.00 mg/dL   Calcium 8.6 (L) 8.9 - 10.3 mg/dL   Total Protein 6.4 (L) 6.5 - 8.1 g/dL   Albumin 3.4 (L) 3.5 - 5.0 g/dL   AST 21 15 - 41 U/L   ALT 14 0 - 44 U/L   Alkaline Phosphatase 85 38 - 126 U/L   Total Bilirubin 0.5 0.3 - 1.2 mg/dL   GFR calc non Af Amer 35 (L) >60 mL/min   GFR calc Af Amer 41 (L) >60 mL/min   Anion gap 14 5 - 15    Comment: Performed at [facility], [place] [address] [place], [place] [address]  Troponin I - ONCE - STAT     Status: None   Collection Time: 05/08/18  5:31 AM  Result Value Ref Range   Troponin I <0.03 <0.03 ng/mL    Comment: Performed at [facility] [address] [place], [place] [address]  TSH     Status: Abnormal   Collection Time: 05/08/18  5:31 AM  Result Value Ref Range   TSH 4.924 (H) 0.350 - 4.500 uIU/mL    Comment: Performed by a 3rd Generation assay with a functional sensitivity of <=0.01 uIU/mL. Performed at [facility], [place] [address], [place], [place] [address]   CBC WITH DIFFERENTIAL     Status: Abnormal   Collection Time: 05/08/18  5:31 AM  Result Value Ref Range   WBC 14.2 (H) 4.0 - 10.5 K/uL   RBC 4.42 3.87 - 5.11 MIL/uL   Hemoglobin 12.2 12.0 - 15.0 g/dL   HCT 39.7 36.0 - 46.0 %   MCV 89.8 80.0 - 100.0 fL   MCH 27.6 26.0 - 34.0 pg  MCHC 30.7 30.0 - 36.0 g/dL   RDW 14.1 11.5 - 15.5 %   Platelets 334 150 - 400 K/uL   nRBC 0.0 0.0 - 0.2 %   Neutrophils Relative % 69 %   Neutro Abs 9.8 (H) 1.7 - 7.7 K/uL   Lymphocytes Relative 19 %   Lymphs Abs 2.8 0.7 - 4.0 K/uL   Monocytes Relative 9 %   Monocytes Absolute 1.2 (H) 0.1 - 1.0 K/uL   Eosinophils Relative 3 %   Eosinophils Absolute 0.4 0.0 - 0.5 K/uL   Basophils Relative 0 %    Basophils Absolute 0.1 0.0 - 0.1 K/uL   Immature Granulocytes 0 %   Abs Immature Granulocytes 0.05 0.00 - 0.07 K/uL    Comment: Performed at [facility] [address], [place], [place] [address]  Cortisol     Status: None   Collection Time: 05/08/18  5:31 AM  Result Value Ref Range   Cortisol, Plasma 8.5 ug/dL    Comment: (NOTE) AM    6.7 - 22.6 ug/dL PM   <10.0       ug/dL Performed at [facility] [address] [place], [place] [address]   CK     Status: None   Collection Time: 05/08/18  5:31 AM  Result Value Ref Range   Total CK 67 38 - 234 U/L    Comment: Performed at [facility], [place] [address], [place], [place] [address]  CBG monitoring, ED     Status: Abnormal   Collection Time: 05/08/18  7:42 AM  Result Value Ref Range   Glucose-Capillary 130 (H) 70 - 99 mg/dL   Comment 1 Notify RN    Comment 2 Document in Chart   CBG monitoring, ED     Status: Abnormal   Collection Time: 05/08/18 12:32 PM  Result Value Ref Range   Glucose-Capillary 139 (H) 70 - 99 mg/dL   Comment 1 Notify RN    Comment 2 Document in Chart     Dg Thoracic Spine 2 View  Result Date: 05/08/2018 CLINICAL DATA:  Initial evaluation for acute trauma, fall. EXAM: THORACIC SPINE 2 VIEWS COMPARISON:  None available. FINDINGS: Vertebral bodies normally aligned with preservation of the normal thoracic kyphosis. No listhesis or malalignment. Mild chronic wedging with superimposed Schmorl's node at the superior endplate of B34, stable. Vertebral body heights otherwise maintained without acute or chronic fracture. Mild multilevel degenerative changes noted throughout the thoracic spine. Bilateral shoulder arthroplasties noted. Visualized lungs grossly clear. IMPRESSION: No radiographic evidence for acute traumatic injury within the thoracic spine. Electronically Signed   By: Jeannine Boga M.D.   On: 05/08/2018 01:21   Dg Lumbar Spine Complete  Result Date: 05/08/2018 CLINICAL DATA:   Initial evaluation for acute lower back pain status post fall. EXAM: LUMBAR SPINE - COMPLETE 4+ VIEW COMPARISON:  Prior radiograph from 01/13/2017. FINDINGS: Five non rib-bearing lumbar type vertebral bodies. Levoscoliosis with apex at L4. Trace retrolisthesis of L1 on L2, stable. Alignment otherwise within normal limits. Mild chronic wedging of the T12 vertebral body is stable. No other acute or chronic fracture. Moderate to advanced multilevel degenerative spondylolysis, grossly similar. No acute soft tissue abnormality. Prominent aortic atherosclerosis noted. Moderate retained stool within the right colon. IMPRESSION: 1. No radiographic evidence for acute traumatic injury within the lumbar spine. 2. Levoscoliosis with moderate to advanced multilevel degenerative spondylolysis, grossly similar to previous. 3. Prominent aortic atherosclerosis. Electronically Signed   By: Jeannine Boga  M.D.   On: 05/08/2018 01:17   Dg Ankle Complete Right  Result Date: 05/08/2018 CLINICAL DATA:  Initial evaluation for acute pain status post fall. EXAM: RIGHT ANKLE - COMPLETE 3+ VIEW COMPARISON:  None. FINDINGS: Small osseous fragment seen at the distal aspect of the medial malleolus, somewhat age indeterminate, but could reflect a small avulsion fracture. No other acute fracture dislocation. Ankle mortise approximated. Talar dome intact. No significant soft tissue swelling about the ankle. IMPRESSION: 1. Small osseous fragment at the distal aspect of the medial malleolus, somewhat age indeterminate, but could reflect a small avulsion fracture. Correlation with physical exam recommended. 2. No other acute osseous abnormality about the ankle. Electronically Signed   By: Jeannine Boga M.D.   On: 05/08/2018 01:12   Ct Head Wo Contrast  Result Date: 05/08/2018 CLINICAL DATA:  Dizziness since Friday resulting in fall. Patient hit back of head without loss consciousness. Patient found to have orthostatic hypotension.  EXAM: CT HEAD WITHOUT CONTRAST CT CERVICAL SPINE WITHOUT CONTRAST TECHNIQUE: Multidetector CT imaging of the head and cervical spine was performed following the standard protocol without intravenous contrast. Multiplanar CT image reconstructions of the cervical spine were also generated. COMPARISON:  None. FINDINGS: CT HEAD FINDINGS Brain: Age related cerebral atrophy with chronic stable small vessel ischemia. No intra-axial mass, midline shift or edema. No new large vascular territory infarct. No extra-axial fluid collections. No hydrocephalus. No effacement of the basal cisterns. Midline fourth ventricle. The brainstem and cerebellum are nonacute. Vascular: Atherosclerosis of the carotid siphons. No hyperdense vessel sign. Skull: Intact without skull fracture. Sinuses/Orbits: Chronic right maxillary sinus mucosal thickening with calcified desiccated mucus noted within. Mild ethmoid sinus mucosal thickening. Bilateral lens replacements. Mastoids are clear. Other: None. CT CERVICAL SPINE FINDINGS Alignment: Mild reversal cervical lordosis at C5-6 likely on the basis of degenerative disc disease and facet arthropathy. Skull base and vertebrae: No acute skull base fracture. No acute cervical spine fracture or suspicious osseous lesions. Intact atlantodental interval. Soft tissues and spinal canal: No prevertebral fluid or swelling. No visible canal hematoma. Disc levels: Multilevel degenerative disc disease at C3-4 and from C5 through T1. No significant central foraminal encroachment. Multilevel degenerative facet arthropathy. Upper chest: Unremarkable. Minimal scarring at the left lung apex. Other: Extracranial carotid arteriosclerosis bilaterally. IMPRESSION: 1. Age related cerebral atrophy with chronic small vessel ischemia. No acute intracranial abnormality. 2. Cervical spondylosis without acute cervical spine fracture or posttraumatic listhesis. Electronically Signed   By: Ashley Royalty M.D.   On: 05/08/2018 01:51    Ct Cervical Spine Wo Contrast  Result Date: 05/08/2018 CLINICAL DATA:  Dizziness since Friday resulting in fall. Patient hit back of head without loss consciousness. Patient found to have orthostatic hypotension. EXAM: CT HEAD WITHOUT CONTRAST CT CERVICAL SPINE WITHOUT CONTRAST TECHNIQUE: Multidetector CT imaging of the head and cervical spine was performed following the standard protocol without intravenous contrast. Multiplanar CT image reconstructions of the cervical spine were also generated. COMPARISON:  None. FINDINGS: CT HEAD FINDINGS Brain: Age related cerebral atrophy with chronic stable small vessel ischemia. No intra-axial mass, midline shift or edema. No new large vascular territory infarct. No extra-axial fluid collections. No hydrocephalus. No effacement of the basal cisterns. Midline fourth ventricle. The brainstem and cerebellum are nonacute. Vascular: Atherosclerosis of the carotid siphons. No hyperdense vessel sign. Skull: Intact without skull fracture. Sinuses/Orbits: Chronic right maxillary sinus mucosal thickening with calcified desiccated mucus noted within. Mild ethmoid sinus mucosal thickening. Bilateral lens replacements. Mastoids are clear. Other: None. CT  CERVICAL SPINE FINDINGS Alignment: Mild reversal cervical lordosis at C5-6 likely on the basis of degenerative disc disease and facet arthropathy. Skull base and vertebrae: No acute skull base fracture. No acute cervical spine fracture or suspicious osseous lesions. Intact atlantodental interval. Soft tissues and spinal canal: No prevertebral fluid or swelling. No visible canal hematoma. Disc levels: Multilevel degenerative disc disease at C3-4 and from C5 through T1. No significant central foraminal encroachment. Multilevel degenerative facet arthropathy. Upper chest: Unremarkable. Minimal scarring at the left lung apex. Other: Extracranial carotid arteriosclerosis bilaterally. IMPRESSION: 1. Age related cerebral atrophy with  chronic small vessel ischemia. No acute intracranial abnormality. 2. Cervical spondylosis without acute cervical spine fracture or posttraumatic listhesis. Electronically Signed   By: Ashley Royalty M.D.   On: 05/08/2018 01:51   Dg Knee Complete 4 Views Left  Result Date: 05/08/2018 CLINICAL DATA:  Initial evaluation for acute pain status post fall. EXAM: LEFT KNEE - COMPLETE 4+ VIEW COMPARISON:  None available. FINDINGS: No acute fracture dislocation. No joint effusion. Mild-to-moderate tricompartmental degenerative osteoarthrosis. Associated chondrocalcinosis noted. No discrete osseous lesions. No soft tissue abnormality. IMPRESSION: 1. No acute fracture or dislocation. 2. Mild to moderate tricompartmental degenerative osteoarthrosis with associated chondrocalcinosis. Electronically Signed   By: Jeannine Boga M.D.   On: 05/08/2018 01:13   Dg Knee Complete 4 Views Right  Result Date: 05/08/2018 CLINICAL DATA:  Initial evaluation for acute trauma, fall. EXAM: RIGHT KNEE - COMPLETE 4+ VIEW COMPARISON:  None. FINDINGS: Right total knee arthroplasty in place. No appreciable hardware complication. No acute fracture dislocation. No joint effusion. No appreciable soft tissue injury. IMPRESSION: 1. No acute osseous abnormality about the right knee. 2. Right total knee arthroplasty in place without complication. Electronically Signed   By: Jeannine Boga M.D.   On: 05/08/2018 01:15    Review of Systems  Constitutional: Negative for weight loss.  HENT: Negative for ear discharge, ear pain, hearing loss and tinnitus.   Eyes: Negative for blurred vision, double vision, photophobia and pain.  Respiratory: Negative for cough, sputum production and shortness of breath.   Cardiovascular: Negative for chest pain.  Gastrointestinal: Negative for abdominal pain, nausea and vomiting.  Genitourinary: Negative for dysuria, flank pain, frequency and urgency.  Musculoskeletal: Positive for falls and joint pain  (Right ankle). Negative for back pain, myalgias and neck pain.  Neurological: Positive for dizziness. Negative for tingling, sensory change, focal weakness, loss of consciousness and headaches.  Endo/Heme/Allergies: Does not bruise/bleed easily.  Psychiatric/Behavioral: Negative for depression, memory loss and substance abuse. The patient is not nervous/anxious.    Blood pressure 106/63, pulse 76, temperature 98 F (36.7 C), temperature source Oral, resp. rate 17, SpO2 97 %. Physical Exam  Constitutional: She appears well-developed and well-nourished. No distress.  HENT:  Head: Normocephalic and atraumatic.  Eyes: Conjunctivae are normal. Right eye exhibits no discharge. Left eye exhibits no discharge. No scleral icterus.  Neck: Normal range of motion.  Cardiovascular: Normal rate and regular rhythm.  Respiratory: Effort normal. No respiratory distress.  Musculoskeletal:     Comments: LLE No traumatic wounds, ecchymosis, or rash  Short leg splint intact  No knee effusion  Knee stable to varus/ valgus and anterior/posterior stress  Sens SPN intact, DPN, TN paresthetic to absent  Motor EHL 5/5  Cap refill <2s  Neurological: She is alert.  Skin: Skin is warm and dry. She is not diaphoretic.  Psychiatric: She has a normal mood and affect. Her behavior is normal.    Assessment/Plan: Right  medial mal fx -- Will treat initially with NWB but I imagine she'll convert to a CAM boot at f/u. She should see Dr. Onnie Graham or her established orthopedist in that practice in 1 week. Keep splint intact and dry.    Lisette Abu, PA-C Orthopedic Surgery [phone] 05/08/2018, 1:05 PM

## 2018-05-09 DIAGNOSIS — E1142 Type 2 diabetes mellitus with diabetic polyneuropathy: Secondary | ICD-10-CM | POA: Diagnosis not present

## 2018-05-09 DIAGNOSIS — S8251XD Displaced fracture of medial malleolus of right tibia, subsequent encounter for closed fracture with routine healing: Secondary | ICD-10-CM | POA: Diagnosis not present

## 2018-05-09 DIAGNOSIS — N179 Acute kidney failure, unspecified: Secondary | ICD-10-CM | POA: Diagnosis not present

## 2018-05-09 DIAGNOSIS — I951 Orthostatic hypotension: Secondary | ICD-10-CM | POA: Diagnosis not present

## 2018-05-09 DIAGNOSIS — S8254XD Nondisplaced fracture of medial malleolus of right tibia, subsequent encounter for closed fracture with routine healing: Secondary | ICD-10-CM | POA: Diagnosis not present

## 2018-05-09 LAB — COMPREHENSIVE METABOLIC PANEL
ALT: 12 U/L (ref 0–44)
AST: 17 U/L (ref 15–41)
Albumin: 3.1 g/dL — ABNORMAL LOW (ref 3.5–5.0)
Alkaline Phosphatase: 71 U/L (ref 38–126)
Anion gap: 9 (ref 5–15)
BILIRUBIN TOTAL: 0.5 mg/dL (ref 0.3–1.2)
BUN: 10 mg/dL (ref 8–23)
CALCIUM: 8.9 mg/dL (ref 8.9–10.3)
CO2: 26 mmol/L (ref 22–32)
Chloride: 101 mmol/L (ref 98–111)
Creatinine, Ser: 0.94 mg/dL (ref 0.44–1.00)
GFR calc Af Amer: >60 mL/min (ref >60)
GFR calc non Af Amer: 57 mL/min — ABNORMAL LOW (ref >60)
Glucose, Bld: 179 mg/dL — ABNORMAL HIGH (ref 70–99)
Potassium: 3.7 mmol/L (ref 3.5–5.1)
Sodium: 136 mmol/L (ref 135–145)
TOTAL PROTEIN: 6.3 g/dL — AB (ref 6.5–8.1)

## 2018-05-09 LAB — GLUCOSE, CAPILLARY
Glucose-Capillary: 147 mg/dL — ABNORMAL HIGH (ref 70–99)
Glucose-Capillary: 216 mg/dL — ABNORMAL HIGH (ref 70–99)
Glucose-Capillary: 127 mg/dL — ABNORMAL HIGH (ref 70–99)
Glucose-Capillary: 207 mg/dL — ABNORMAL HIGH (ref 70–99)

## 2018-05-09 LAB — ACTH STIMULATION, 3 TIME POINTS
Cortisol, 30 Min: 21.7 ug/dL
Cortisol, 60 Min: 26.5 ug/dL
Cortisol, Base: 12.5 ug/dL

## 2018-05-09 LAB — CBC
HEMATOCRIT: 36.6 % (ref 36.0–46.0)
Hemoglobin: 11.6 g/dL — ABNORMAL LOW (ref 12.0–15.0)
MCH: 28.6 pg (ref 26.0–34.0)
MCHC: 31.7 g/dL (ref 30.0–36.0)
MCV: 90.4 fL (ref 80.0–100.0)
Platelets: 330 10*3/uL (ref 150–400)
RBC: 4.05 MIL/uL (ref 3.87–5.11)
RDW: 14.4 % (ref 11.5–15.5)
WBC: 9.1 10*3/uL (ref 4.0–10.5)
nRBC: 0 % (ref 0.0–0.2)

## 2018-05-09 LAB — MAGNESIUM: Magnesium: 1.5 mg/dL — ABNORMAL LOW (ref 1.7–2.4)

## 2018-05-09 MED ORDER — LEVOTHYROXINE SODIUM 25 MCG PO TABS
125.0000 ug | ORAL_TABLET | Freq: Every day | ORAL | Status: DC
  Administered 2018-05-10 – 2018-05-11 (×2): 125 ug via ORAL
  Filled 2018-05-09 (×2): qty 1

## 2018-05-09 MED ORDER — COSYNTROPIN 0.25 MG IJ SOLR
0.2500 mg | Freq: Once | INTRAMUSCULAR | Status: AC
  Administered 2018-05-09: 0.25 mg via INTRAVENOUS
  Filled 2018-05-09: qty 0.25

## 2018-05-09 MED ORDER — COSYNTROPIN 0.25 MG IJ SOLR: 0.2500 mg | Freq: Once | INTRAMUSCULAR | Status: DC

## 2018-05-09 NOTE — Progress Notes (Signed)
PROGRESS NOTE                                                                                                                                                                                                             Patient Demographics:    Shannon Higgins, is a 84 y.o. female, DOB - [birth year], WGN:[id number]  Admit date - 05/07/2018   Admitting Physician Rise Patience, MD  Outpatient Primary MD for the patient is Pleas Koch, NP  LOS - 0  Outpatient Specialists: none  Chief Complaint  Patient presents with  . Fall       Brief Narrative  84 year old female with history of diabetes mellitus type 2, hypertension, neuropathy pain, sleep apnea, depression, breast cancer in remission, recurrent UTIs brought to the ED after she was found to be orthostatic in the PCP office.  For the past 1 week she reported having at least 5 falls. Few days prior to admission she did hit her head without any loss of consciousness.  She again had a fall a day prior to admission where she hurt her right leg. In the ED she was orthostatic with labs showing acute kidney injury, leukocytosis and UA consistent with UTI.  CT of the head and cervical spine were unremarkable.  X-ray showed fracture of the right tibial medial malleolus.  Patient placed in observation with IV fluids and antibiotics for possible UTI.   Subjective:   Patient denies any dizziness.  Reports some pain in her right leg.   Assessment  & Plan :    Principal Problem:   Orthostatic hypotension Possibly due to dehydration.  Received IV fluids.  Repeat orthostasis today (lying and sitting up) negative.  A stim test done today showed appropriate response.  Low electrolytes replenished. Her TSH is mildly elevated to 4.9.  Will increase her Synthroid dose. Cardiac monitoring and fluid discontinued.  Active Problems: Acute kidney injury Resolved with IV  fluids.  Recurrent falls with right medial malleolus fracture. Splint placed.  Orthopedics recommended nonweightbearing over right lower extremity and outpatient follow-up.  PT recommends SNF.  Pyuria Was empirically on IV Rocephin but asymptomatic.  Antibiotic discontinued.  Hypothyroidism On Synthroid with mildly elevated TSH of 4.9.  Will increase Synthroid dose to 125 mcg daily.  Diabetes mellitus type 2 with peripheral neuropathy Monitor on sliding scale  coverage.  Continue Neurontin.  Holding metformin.  Dementia Continue Aricept    MDD (major depressive disorder) Continue Cymbalta and trazodone.  History of breast cancer Continue anastrozole   PT evaluation performed.  SNF recommended and is appropriate as the patient has received 3 days of hospital care and is felt to need rehab services to restore this patient to the prior level of function to achieve safe transition back to home care.  This patient needs rehab services for at least 5 days/week and skilled nursing services daily to facilitate this transition.  Rehab is being requested as the most appropriate discharge option for this patient and is not felt to be for custodial care as evidenced by patient being independent prior to hospitalization.  Code Status : Full code  Family Communication  : None at bedside  Disposition Plan  : SNF per PT.   Barriers For Discharge : Improving symptoms, awaiting SNF.  Consults  : None  Procedures  : CT head and cervical spine  DVT Prophylaxis  :  Lovenox -  Lab Results  Component Value Date   PLT 330 05/09/2018    Antibiotics  :    Anti-infectives (From admission, onward)   Start     Dose/Rate Route Frequency Ordered Stop   05/08/18 2000  cefTRIAXone (ROCEPHIN) 1 g in sodium chloride 0.9 % 100 mL IVPB  Status:  Discontinued     1 g 200 mL/hr over 30 Minutes Intravenous Every 24 hours 05/08/18 0443 05/08/18 1713   05/07/18 2230  cefTRIAXone (ROCEPHIN) 1 g in sodium  chloride 0.9 % 100 mL IVPB     1 g 200 mL/hr over 30 Minutes Intravenous  Once 05/07/18 2216 05/07/18 2332        Objective:   Vitals:   05/09/18 0429 05/09/18 1101 05/09/18 1104 05/09/18 1208  BP: 128/60   135/76  Pulse: 75   70  Resp: 18 20 20 20   Temp: 97.6 F (36.4 C) (!) 97.2 F (36.2 C)  98.4 F (36.9 C)  TempSrc: Oral Oral  Oral  SpO2: 93% 97%  100%  Weight: 74.2 kg     Height:        Wt Readings from Last 3 Encounters:  05/09/18 74.2 kg  03/02/18 74.6 kg  02/23/18 74.8 kg     Intake/Output Summary (Last 24 hours) at 05/09/2018 1346 Last data filed at 05/09/2018 1120 Gross per 24 hour  Intake 2592.51 ml  Output 2150 ml  Net 442.51 ml     Physical Exam  Gen: not in distress HEENT: no pallor, moist mucosa, supple neck Chest: clear b/l, no added sounds CVS: N S1&S2, no murmurs,  GI: soft, NT, ND,  Musculoskeletal: warm, splint with Ace wrap over right leg     Data Review:    CBC Recent Labs  Lab 05/07/18 1735 05/08/18 0531 05/09/18 0919  WBC 15.4* 14.2* 9.1  HGB 13.9 12.2 11.6*  HCT 44.0 39.7 36.6  PLT 433* 334 330  MCV 91.5 89.8 90.4  MCH 28.9 27.6 28.6  MCHC 31.6 30.7 31.7  RDW 14.3 14.1 14.4  LYMPHSABS  --  2.8  --   MONOABS  --  1.2*  --   EOSABS  --  0.4  --   BASOSABS  --  0.1  --     Chemistries  Recent Labs  Lab 05/07/18 1735 05/08/18 0531 05/09/18 0919  NA 131* 134* 136  K 5.0 3.9 3.7  CL 97* 101 101  CO2 20* 19* 26  GLUCOSE 173* 146* 179*  BUN 26* 26* 10  CREATININE 1.62* 1.40* 0.94  CALCIUM 9.7 8.6* 8.9  MG  --   --  1.5*  AST  --  21 17  ALT  --  14 12  ALKPHOS  --  85 71  BILITOT  --  0.5 0.5   ------------------------------------------------------------------------------------------------------------------ No results for input(s): CHOL, HDL, LDLCALC, TRIG, CHOLHDL, LDLDIRECT in the last 72 hours.  Lab Results  Component Value Date   HGBA1C 7.4 (H) 12/26/2017    ------------------------------------------------------------------------------------------------------------------ Recent Labs    05/08/18 0531  TSH 4.924*   ------------------------------------------------------------------------------------------------------------------ No results for input(s): VITAMINB12, FOLATE, FERRITIN, TIBC, IRON, RETICCTPCT in the last 72 hours.  Coagulation profile No results for input(s): INR, PROTIME in the last 168 hours.  No results for input(s): DDIMER in the last 72 hours.  Cardiac Enzymes Recent Labs  Lab 05/08/18 0531  TROPONINI <0.03   ------------------------------------------------------------------------------------------------------------------ No results found for: BNP  Inpatient Medications  Scheduled Meds: . anastrozole  1 mg Oral Daily  . aspirin EC  81 mg Oral Daily  . brimonidine  1 drop Both Eyes TID  . donepezil  10 mg Oral Daily  . dorzolamide-timolol  1 drop Both Eyes BID  . DULoxetine  60 mg Oral Daily  . enoxaparin (LOVENOX) injection  40 mg Subcutaneous Daily  . gabapentin  800 mg Oral TID  . insulin aspart  0-9 Units Subcutaneous TID WC  . latanoprost  1 drop Both Eyes QHS  . levothyroxine  112 mcg Oral Q0600  . pantoprazole  40 mg Oral Daily  . rosuvastatin  10 mg Oral q1800   Continuous Infusions: PRN Meds:.acetaminophen **OR** acetaminophen, meclizine, ondansetron **OR** ondansetron (ZOFRAN) IV, traZODone  Micro Results No results found for this or any previous visit (from the past 240 hour(s)).  Radiology Reports Dg Thoracic Spine 2 View  Result Date: 05/08/2018 CLINICAL DATA:  Initial evaluation for acute trauma, fall. EXAM: THORACIC SPINE 2 VIEWS COMPARISON:  None available. FINDINGS: Vertebral bodies normally aligned with preservation of the normal thoracic kyphosis. No listhesis or malalignment. Mild chronic wedging with superimposed Schmorl's node at the superior endplate of E26, stable. Vertebral body  heights otherwise maintained without acute or chronic fracture. Mild multilevel degenerative changes noted throughout the thoracic spine. Bilateral shoulder arthroplasties noted. Visualized lungs grossly clear. IMPRESSION: No radiographic evidence for acute traumatic injury within the thoracic spine. Electronically Signed   By: Jeannine Boga M.D.   On: 05/08/2018 01:21   Dg Lumbar Spine Complete  Result Date: 05/08/2018 CLINICAL DATA:  Initial evaluation for acute lower back pain status post fall. EXAM: LUMBAR SPINE - COMPLETE 4+ VIEW COMPARISON:  Prior radiograph from 01/13/2017. FINDINGS: Five non rib-bearing lumbar type vertebral bodies. Levoscoliosis with apex at L4. Trace retrolisthesis of L1 on L2, stable. Alignment otherwise within normal limits. Mild chronic wedging of the T12 vertebral body is stable. No other acute or chronic fracture. Moderate to advanced multilevel degenerative spondylolysis, grossly similar. No acute soft tissue abnormality. Prominent aortic atherosclerosis noted. Moderate retained stool within the right colon. IMPRESSION: 1. No radiographic evidence for acute traumatic injury within the lumbar spine. 2. Levoscoliosis with moderate to advanced multilevel degenerative spondylolysis, grossly similar to previous. 3. Prominent aortic atherosclerosis. Electronically Signed   By: Jeannine Boga M.D.   On: 05/08/2018 01:17   Dg Ankle Complete Right  Result Date: 05/08/2018 CLINICAL DATA:  Initial evaluation for acute pain status post fall. EXAM: RIGHT  ANKLE - COMPLETE 3+ VIEW COMPARISON:  None. FINDINGS: Small osseous fragment seen at the distal aspect of the medial malleolus, somewhat age indeterminate, but could reflect a small avulsion fracture. No other acute fracture dislocation. Ankle mortise approximated. Talar dome intact. No significant soft tissue swelling about the ankle. IMPRESSION: 1. Small osseous fragment at the distal aspect of the medial malleolus, somewhat  age indeterminate, but could reflect a small avulsion fracture. Correlation with physical exam recommended. 2. No other acute osseous abnormality about the ankle. Electronically Signed   By: Jeannine Boga M.D.   On: 05/08/2018 01:12   Ct Head Wo Contrast  Result Date: 05/08/2018 CLINICAL DATA:  Dizziness since Friday resulting in fall. Patient hit back of head without loss consciousness. Patient found to have orthostatic hypotension. EXAM: CT HEAD WITHOUT CONTRAST CT CERVICAL SPINE WITHOUT CONTRAST TECHNIQUE: Multidetector CT imaging of the head and cervical spine was performed following the standard protocol without intravenous contrast. Multiplanar CT image reconstructions of the cervical spine were also generated. COMPARISON:  None. FINDINGS: CT HEAD FINDINGS Brain: Age related cerebral atrophy with chronic stable small vessel ischemia. No intra-axial mass, midline shift or edema. No new large vascular territory infarct. No extra-axial fluid collections. No hydrocephalus. No effacement of the basal cisterns. Midline fourth ventricle. The brainstem and cerebellum are nonacute. Vascular: Atherosclerosis of the carotid siphons. No hyperdense vessel sign. Skull: Intact without skull fracture. Sinuses/Orbits: Chronic right maxillary sinus mucosal thickening with calcified desiccated mucus noted within. Mild ethmoid sinus mucosal thickening. Bilateral lens replacements. Mastoids are clear. Other: None. CT CERVICAL SPINE FINDINGS Alignment: Mild reversal cervical lordosis at C5-6 likely on the basis of degenerative disc disease and facet arthropathy. Skull base and vertebrae: No acute skull base fracture. No acute cervical spine fracture or suspicious osseous lesions. Intact atlantodental interval. Soft tissues and spinal canal: No prevertebral fluid or swelling. No visible canal hematoma. Disc levels: Multilevel degenerative disc disease at C3-4 and from C5 through T1. No significant central foraminal  encroachment. Multilevel degenerative facet arthropathy. Upper chest: Unremarkable. Minimal scarring at the left lung apex. Other: Extracranial carotid arteriosclerosis bilaterally. IMPRESSION: 1. Age related cerebral atrophy with chronic small vessel ischemia. No acute intracranial abnormality. 2. Cervical spondylosis without acute cervical spine fracture or posttraumatic listhesis. Electronically Signed   By: Ashley Royalty M.D.   On: 05/08/2018 01:51   Ct Cervical Spine Wo Contrast  Result Date: 05/08/2018 CLINICAL DATA:  Dizziness since Friday resulting in fall. Patient hit back of head without loss consciousness. Patient found to have orthostatic hypotension. EXAM: CT HEAD WITHOUT CONTRAST CT CERVICAL SPINE WITHOUT CONTRAST TECHNIQUE: Multidetector CT imaging of the head and cervical spine was performed following the standard protocol without intravenous contrast. Multiplanar CT image reconstructions of the cervical spine were also generated. COMPARISON:  None. FINDINGS: CT HEAD FINDINGS Brain: Age related cerebral atrophy with chronic stable small vessel ischemia. No intra-axial mass, midline shift or edema. No new large vascular territory infarct. No extra-axial fluid collections. No hydrocephalus. No effacement of the basal cisterns. Midline fourth ventricle. The brainstem and cerebellum are nonacute. Vascular: Atherosclerosis of the carotid siphons. No hyperdense vessel sign. Skull: Intact without skull fracture. Sinuses/Orbits: Chronic right maxillary sinus mucosal thickening with calcified desiccated mucus noted within. Mild ethmoid sinus mucosal thickening. Bilateral lens replacements. Mastoids are clear. Other: None. CT CERVICAL SPINE FINDINGS Alignment: Mild reversal cervical lordosis at C5-6 likely on the basis of degenerative disc disease and facet arthropathy. Skull base and vertebrae: No acute skull base  fracture. No acute cervical spine fracture or suspicious osseous lesions. Intact atlantodental  interval. Soft tissues and spinal canal: No prevertebral fluid or swelling. No visible canal hematoma. Disc levels: Multilevel degenerative disc disease at C3-4 and from C5 through T1. No significant central foraminal encroachment. Multilevel degenerative facet arthropathy. Upper chest: Unremarkable. Minimal scarring at the left lung apex. Other: Extracranial carotid arteriosclerosis bilaterally. IMPRESSION: 1. Age related cerebral atrophy with chronic small vessel ischemia. No acute intracranial abnormality. 2. Cervical spondylosis without acute cervical spine fracture or posttraumatic listhesis. Electronically Signed   By: Ashley Royalty M.D.   On: 05/08/2018 01:51   Dg Knee Complete 4 Views Left  Result Date: 05/08/2018 CLINICAL DATA:  Initial evaluation for acute pain status post fall. EXAM: LEFT KNEE - COMPLETE 4+ VIEW COMPARISON:  None available. FINDINGS: No acute fracture dislocation. No joint effusion. Mild-to-moderate tricompartmental degenerative osteoarthrosis. Associated chondrocalcinosis noted. No discrete osseous lesions. No soft tissue abnormality. IMPRESSION: 1. No acute fracture or dislocation. 2. Mild to moderate tricompartmental degenerative osteoarthrosis with associated chondrocalcinosis. Electronically Signed   By: Jeannine Boga M.D.   On: 05/08/2018 01:13   Dg Knee Complete 4 Views Right  Result Date: 05/08/2018 CLINICAL DATA:  Initial evaluation for acute trauma, fall. EXAM: RIGHT KNEE - COMPLETE 4+ VIEW COMPARISON:  None. FINDINGS: Right total knee arthroplasty in place. No appreciable hardware complication. No acute fracture dislocation. No joint effusion. No appreciable soft tissue injury. IMPRESSION: 1. No acute osseous abnormality about the right knee. 2. Right total knee arthroplasty in place without complication. Electronically Signed   By: Jeannine Boga M.D.   On: 05/08/2018 01:15    Time Spent in minutes  25   Keishawn Rajewski M.D on 05/09/2018 at 1:46  PM  Between 7am to 7pm - Pager - [phone]  After 7pm go to [email or web address] - password [facility]  [facility] -  Office  [phone]

## 2018-05-09 NOTE — Clinical Social Work Placement (Signed)
   CLINICAL SOCIAL WORK PLACEMENT  NOTE  Date:  05/09/2018  Patient Details  Name: Shannon Higgins. Test MRN: [id number] Date of Birth: [birth year]  Clinical Social Work is seeking post-discharge placement for this patient at the [place] level of care (*CSW will initial, date and re-position this form in  chart as items are completed):      Patient/family provided with [place] Work Department's list of facilities offering this level of care within the geographic area requested by the patient (or if unable, by the patient's family).      Patient/family informed of their freedom to choose among providers that offer the needed level of care, that participate in Medicare, Medicaid or managed care program needed by the patient, have an available bed and are willing to accept the patient.      Patient/family informed of [facility]'s ownership interest in [facility] and [facility], as well as of the fact that they are under no obligation to receive care at these facilities.  PASRR submitted to EDS on 05/09/18     PASRR number received on       Existing PASRR number confirmed on       FL2 transmitted to all facilities in geographic area requested by pt/family on 05/09/18     FL2 transmitted to all facilities within larger geographic area on       Patient informed that his/her managed care company has contracts with or will negotiate with certain facilities, including the following:            Patient/family informed of bed offers received.  Patient chooses bed at       Physician recommends and patient chooses bed at      Patient to be transferred to   on  .  Patient to be transferred to facility by       Patient family notified on   of transfer.  Name of family member notified:        PHYSICIAN Please sign FL2     Additional Comment:    _______________________________________________ Candie Chroman, LCSW 05/09/2018, 12:31 PM

## 2018-05-09 NOTE — NC FL2 (Signed)
[place] LEVEL OF CARE SCREENING TOOL     IDENTIFICATION  Patient Name: Shannon Higgins. [place] Birthdate: Apr 03, 1935 Sex: female Admission Date (Current Location): 05/07/2018  [facility] and [place] Number:  Herbalist and Address:  [facility] [place] [place] [address], [place], [place] [address]      Provider Number: [id number]  Attending Physician Name and Address:  Louellen Molder, MD  Relative Name and Phone Number:       Current Level of Care: Hospital Recommended Level of Care: [place] Prior Approval Number:    Date Approved/Denied:   PASRR Number: Manual review  Discharge Plan: SNF    Current Diagnoses: Patient Active Problem List   Diagnosis Date Noted  . ARF (acute renal failure) ([place]) 05/08/2018  . Orthostatic hypotension 05/08/2018  . Urinary tract infection without hematuria   . Malignant neoplasm of upper-outer quadrant of left breast in female, estrogen receptor positive ([place]) 10/17/2017  . MDD (major depressive disorder) 06/21/2017  . Chronic midline low back pain with sciatica 01/13/2017  . Sinusitis 12/16/2016  . Hyperlipidemia 12/06/2016  . Glaucoma 12/06/2016  . Neuropathic pain 12/06/2016  . Skin cancer (melanoma) ([place]) 12/06/2016  . Confusion 12/06/2016  . Chronic headaches 12/06/2016  . Transient hypotension 06/14/2016  . AKI (acute kidney injury) ([place]) 06/14/2016  . Hypothyroidism 06/14/2016  . Hyponatremia   . Hypokalemia 04/26/2012  . Anemia 04/26/2012  . Diabetes mellitus ([place]) 04/26/2012  . HTN (hypertension) 04/26/2012  . GERD (gastroesophageal reflux disease) 04/26/2012  . OSA (obstructive sleep apnea) 04/26/2012    Orientation RESPIRATION BLADDER Height & Weight     Self, Time, Situation, Place  Normal Incontinent(Occasionally) Weight: 163 lb 9.3 oz (74.2 kg) Height:  5\' 4"  (162.6 cm)  BEHAVIORAL SYMPTOMS/MOOD NEUROLOGICAL BOWEL NUTRITION STATUS  (None) (None) Continent  Diet(Heart healthy/carb modified.)  AMBULATORY STATUS COMMUNICATION OF NEEDS Skin   Extensive Assist Verbally Skin abrasions                       Personal Care Assistance Level of Assistance  Bathing, Feeding, Dressing Bathing Assistance: Limited assistance Feeding assistance: Independent Dressing Assistance: Limited assistance     Functional Limitations Info  Sight, Hearing, Speech Sight Info: Adequate Hearing Info: Adequate Speech Info: Adequate    SPECIAL CARE FACTORS FREQUENCY  PT (By licensed PT), OT (By licensed OT), Blood pressure     PT Frequency: 5 x week OT Frequency: 5 x week            Contractures Contractures Info: Not present    Additional Factors Info  Code Status, Allergies, Psychotropic Code Status Info: Full code Allergies Info: NKDA Psychotropic Info: Depression: Cymbalta DR 60 mg PO daily, Trazodone 100-200 mg PO QHS prn.         Current Medications (05/09/2018):  This is the current hospital active medication list Current Facility-Administered Medications  Medication Dose Route Frequency Provider Last Rate Last Dose  . acetaminophen (TYLENOL) tablet 650 mg  650 mg Oral Q6H PRN Rise Patience, MD       Or  . acetaminophen (TYLENOL) suppository 650 mg  650 mg Rectal Q6H PRN Rise Patience, MD      . anastrozole (ARIMIDEX) tablet 1 mg  1 mg Oral Daily Rise Patience, MD   1 mg at 05/09/18 6301  . aspirin EC tablet 81 mg  81 mg Oral Daily Rise Patience, MD   81 mg at  05/09/18 9201  . brimonidine (ALPHAGAN) 0.2 % ophthalmic solution 1 drop  1 drop Both Eyes TID Rise Patience, MD   1 drop at 05/09/18 0920  . donepezil (ARICEPT) tablet 10 mg  10 mg Oral Daily Rise Patience, MD   10 mg at 05/09/18 0918  . dorzolamide-timolol (COSOPT) 22.3-6.8 MG/ML ophthalmic solution 1 drop  1 drop Both Eyes BID Rise Patience, MD   1 drop at 05/09/18 0920  . DULoxetine (CYMBALTA) DR capsule 60 mg  60 mg Oral Daily  Rise Patience, MD   60 mg at 05/09/18 0071  . enoxaparin (LOVENOX) injection 40 mg  40 mg Subcutaneous Daily Rise Patience, MD   40 mg at 05/09/18 2197  . gabapentin (NEURONTIN) capsule 800 mg  800 mg Oral TID Rise Patience, MD   800 mg at 05/09/18 5883  . insulin aspart (novoLOG) injection 0-9 Units  0-9 Units Subcutaneous TID WC Rise Patience, MD   1 Units at 05/09/18 [id number]  . latanoprost (XALATAN) 0.005 % ophthalmic solution 1 drop  1 drop Both Eyes QHS Rise Patience, MD   1 drop at 05/08/18 2128  . levothyroxine (SYNTHROID, LEVOTHROID) tablet 112 mcg  112 mcg Oral Q0600 Rise Patience, MD   112 mcg at 05/09/18 0558  . meclizine (ANTIVERT) tablet 25 mg  25 mg Oral BID PRN Rise Patience, MD      . ondansetron [facility]) tablet 4 mg  4 mg Oral Q6H PRN Rise Patience, MD       Or  . ondansetron [facility]) injection 4 mg  4 mg Intravenous Q6H PRN Rise Patience, MD      . pantoprazole (PROTONIX) EC tablet 40 mg  40 mg Oral Daily Rise Patience, MD   40 mg at 05/09/18 8264  . rosuvastatin (CRESTOR) tablet 10 mg  10 mg Oral q1800 Rise Patience, MD   10 mg at 05/08/18 1630  . traZODone (DESYREL) tablet 100-200 mg  100-200 mg Oral QHS PRN Rise Patience, MD         Discharge Medications: Please see discharge summary for a list of discharge medications.  Relevant Imaging Results:  Relevant Lab Results:   Additional Information SS#: [id number]  Candie Chroman, LCSW

## 2018-05-09 NOTE — Clinical Social Work Note (Signed)
Clinical Social Work Assessment  Patient Details  Name: Shannon Higgins MRN: [id number] Date of Birth: 14-Jul-1934  Date of referral:  05/09/18               Reason for consult:  Facility Placement, Discharge Planning                Permission sought to share information with:  Chartered certified accountant granted to share information::  Yes, Verbal Permission Granted  Name::        Agency::  SNF's  Relationship::     Contact Information:     Housing/Transportation Living arrangements for the past 2 months:  Single Family Home Source of Information:  Patient, Medical Team Patient Interpreter Needed:  None Criminal Activity/Legal Involvement Pertinent to Current Situation/Hospitalization:  No - Comment as needed Significant Relationships:  Adult Children Lives with:  Self Do you feel safe going back to the place where you live?  Yes Need for family participation in patient care:  Yes (Comment)  Care giving concerns:  PT recommending SNF once medically stable for discharge.   Social Worker assessment / plan:  CSW met with patient. No supports at bedside. CSW introduced role and explained that PT recommendations would be discussed. Patient agreeable to SNF. No first preference SNF. Provided CMS Medicare scores for facilities within 25 miles of her zip code. She will review with her daughter when she arrives. PASARR under manual review. Patient had a PASARR from 2007 so [place] [facility] staff member advised that it needed to be updated due to complications facilities can have. No further concerns. CSW encouraged patient to contact CSW as needed. CSW will continue to follow patient for support and facilitate discharge to SNF once medically stable.  Employment status:  Retired Nurse, adult PT Recommendations:  [place] / Referral to community resources:  [place]  Patient/Family's Response to care:  Patient  agreeable to SNF placement. Patient's daughters supportive and involved in patient's care. Patient appreciated social work intervention.  Patient/Family's Understanding of and Emotional Response to Diagnosis, Current Treatment, and Prognosis:  Patient has a good understanding of the reason for admission and her need for rehab prior to returning home. Patient appears happy with hospital care.  Emotional Assessment Appearance:  Appears stated age Attitude/Demeanor/Rapport:  Engaged, Gracious Affect (typically observed):  Accepting, Appropriate, Calm, Pleasant Orientation:  Oriented to Self, Oriented to Place, Oriented to  Time, Oriented to Situation Alcohol / Substance use:  Never Used Psych involvement (Current and /or in the community):  No (Comment)  Discharge Needs  Concerns to be addressed:  Care Coordination Readmission within the last 30 days:  No Current discharge risk:  Dependent with Mobility, Lives alone Barriers to Discharge:  Awaiting Regulatory affairs officer Tour manager), [place], LCSW 05/09/2018, 12:28 PM

## 2018-05-09 NOTE — Progress Notes (Signed)
I responded to a [place] to provide Advance Directive information for the patient. I visited the patient's room and gave an overview of the AD and answered her questions. I provided spiritual support by sharing words of encouragement and by leading in prayer. I shared that the Chaplain is available for additional support as needed or requested.    05/09/18 1000  Clinical Encounter Type  Visited With Patient  Visit Type Spiritual support  Referral From Nurse  Consult/Referral To Chaplain  Spiritual Encounters  Spiritual Needs Prayer;Literature  Stress Factors  Patient Stress Factors None identified    Chaplain Dr Redgie Grayer

## 2018-05-09 NOTE — Progress Notes (Signed)
phelobotimist asked about medication for acth test. I advised it was not ordered. Paged Dr Clementeen Graham to advise

## 2018-05-09 NOTE — Clinical Social Work Note (Addendum)
Uploaded requested documents into  Must for PASARR review.  Dayton Scrape, CSW [phone]  1:59 pm Original PASARR retained: [id number].  Dayton Scrape, [place] [phone]  2:29 pm Met with patient and daughter at bedside. First preference is [facility], second is [facility]. Neither have offered yet. [facility] admissions coordinator will review referral to determine if they can offer a bed. CSW notified her that patient if they can take her, she is stable for discharge whenever we get authorization approval.  Dayton Scrape, [place]  3:47 pm [facility] has offered a bed and will start insurance authorization. Patient and daughter aware.  Dayton Scrape, [place]

## 2018-05-10 DIAGNOSIS — I951 Orthostatic hypotension: Secondary | ICD-10-CM | POA: Diagnosis not present

## 2018-05-10 DIAGNOSIS — S8251XD Displaced fracture of medial malleolus of right tibia, subsequent encounter for closed fracture with routine healing: Secondary | ICD-10-CM

## 2018-05-10 DIAGNOSIS — N179 Acute kidney failure, unspecified: Secondary | ICD-10-CM | POA: Diagnosis not present

## 2018-05-10 DIAGNOSIS — E1142 Type 2 diabetes mellitus with diabetic polyneuropathy: Secondary | ICD-10-CM | POA: Diagnosis not present

## 2018-05-10 DIAGNOSIS — S8254XD Nondisplaced fracture of medial malleolus of right tibia, subsequent encounter for closed fracture with routine healing: Secondary | ICD-10-CM | POA: Diagnosis not present

## 2018-05-10 LAB — GLUCOSE, CAPILLARY
GLUCOSE-CAPILLARY: 120 mg/dL — AB (ref 70–99)
Glucose-Capillary: 123 mg/dL — ABNORMAL HIGH (ref 70–99)
Glucose-Capillary: 205 mg/dL — ABNORMAL HIGH (ref 70–99)
Glucose-Capillary: 156 mg/dL — ABNORMAL HIGH (ref 70–99)

## 2018-05-10 LAB — MRSA PCR SCREENING: MRSA by PCR: NEGATIVE

## 2018-05-10 NOTE — Evaluation (Signed)
Occupational Therapy Evaluation Patient Details Name: Shannon Higgins. Kozub MRN: [id number] DOB: 05-May-1934 Today's Date: 05/10/2018    History of Present Illness Pt is a 84 y.o. female admitted after falling with R foot injury. Worked up for orthostatic hypotension. CT head and C-spine were unremarkable. X-rays revealed right tibia medial malleolus fx; splint placed. PMH Includes DM, HTN, neuropathic pain, sleep apnea, depression, breast CA in remission, recurrent UTIs.   Clinical Impression   Pt with decline in function and safety with ADLs and ADL mobility with decreased strength, balance, endurance and Poor safety awareness with difficulty maintaining NWB of R LE. Pt requires mod - max A with LB ADLs and mod A with functional mobility. Pt plans to d/c to a SNF for further rehab prior to returning home. No further acute OT services are indicated at this time, will defer further OT intervention to SNF    Follow Up Recommendations  SNF    Equipment Recommendations  None recommended by OT(TBD at next venue of care)    Recommendations for Other Services       Precautions / Restrictions Precautions Precautions: Fall Precaution Comments: watch BP Required Braces or Orthoses: Splint/Cast Splint/Cast: R foot Restrictions Weight Bearing Restrictions: Yes RLE Weight Bearing: Non weight bearing      Mobility Bed Mobility Overal bed mobility: Needs Assistance Bed Mobility: Supine to Sit     Supine to sit: Min assist     General bed mobility comments: pt in recliner upon arrival  Transfers Overall transfer level: Needs assistance Equipment used: Rolling walker (2 wheeled) Transfers: Sit to/from [facility] Sit to Stand: Mod assist;From elevated surface Stand pivot transfers: Mod assist       General transfer comment: Pt required modA to assist trunk elevation standing from bed and BSC; repeated cues for correct hand placement. MinA to steady; cues for RLE NWB, but  pt continuing to place foot on ground.     Balance Overall balance assessment: Needs assistance Sitting-balance support: No upper extremity supported;Feet supported Sitting balance-Leahy Scale: Fair     Standing balance support: Bilateral upper extremity supported;During functional activity Standing balance-Leahy Scale: Poor Standing balance comment: UE support and assist for balance with NWB R LE                           ADL either performed or assessed with clinical judgement   ADL Overall ADL's : Needs assistance/impaired Eating/Feeding: Independent;Sitting   Grooming: Wash/dry hands;Wash/dry face;Min guard;Sitting   Upper Body Bathing: Minimal assistance;Sitting   Lower Body Bathing: Minimal assistance;Moderate assistance   Upper Body Dressing : Minimal assistance;Sitting   Lower Body Dressing: Maximal assistance   Toilet Transfer: Moderate assistance;Cueing for safety;Cueing for sequencing;RW;BSC;Stand-pivot   Toileting- Clothing Manipulation and Hygiene: Total assistance;Sit to/from stand       Functional mobility during ADLs: Moderate assistance;Cueing for safety;Rolling walker       Vision Baseline Vision/History: Wears glasses Wears Glasses: Reading only Patient Visual Report: No change from baseline       Perception     Praxis      Pertinent Vitals/Pain Pain Assessment: 0-10 Pain Score: 2  Faces Pain Scale: Hurts little more Pain Location: R foot, L knee Pain Descriptors / Indicators: Sore Pain Intervention(s): Limited activity within patient's tolerance;Monitored during session;Premedicated before session;Repositioned     Hand Dominance Right   Extremity/Trunk Assessment Upper Extremity Assessment Upper Extremity Assessment: Generalized weakness   Lower Extremity Assessment Lower Extremity  Assessment: Defer to PT evaluation       Communication Communication Communication: No difficulties   Cognition Arousal/Alertness:  Awake/alert Behavior During Therapy: WFL for tasks assessed/performed Overall Cognitive Status: Within Functional Limits for tasks assessed                                 General Comments: WFL for tasks, but pt with poor awareness of importance of RLE NWB precautions, choosing not to try to keep with transfers   General Comments       Exercises Other Exercises Other Exercises: While seated in recliner, performed RLE SLR and hip/knee flexion (cues to keep from WB through foot on chair with "heel slides")   Shoulder Instructions      Home Living Family/patient expects to be discharged to:: Private residence Living Arrangements: Alone Available Help at Discharge: Family;Available PRN/intermittently Type of Home: House Home Access: Stairs to enter [employer] of Steps: 2-3   Home Layout: One level     Bathroom Shower/Tub: Occupational psychologist: Handicapped height     Home Equipment: Environmental consultant - 2 wheels;Bedside commode;Shower seat;Wheelchair - manual          Prior Functioning/Environment Level of Independence: Independent with assistive device(s)        Comments: recently using walker since got weaker after Christmas, family stand by for showers        OT Problem List: Decreased strength;Decreased activity tolerance;Decreased knowledge of use of DME or AE;Impaired balance (sitting and/or standing);Decreased coordination;Decreased safety awareness;Decreased knowledge of precautions      OT Treatment/Interventions: Self-care/ADL training    OT Goals(Current goals can be found in the care plan section) Acute Rehab OT Goals Patient Stated Goal: to go to rehab OT Goal Formulation: All assessment and education complete, DC therapy  OT Frequency:     Barriers to D/C: Decreased caregiver support          Co-evaluation              AM-PAC OT "6 Clicks" Daily Activity     Outcome Measure Help from another person eating meals?:  None Help from another person taking care of personal grooming?: A Little Help from another person toileting, which includes using toliet, bedpan, or urinal?: A Lot Help from another person bathing (including washing, rinsing, drying)?: A Lot Help from another person to put on and taking off regular upper body clothing?: A Little Help from another person to put on and taking off regular lower body clothing?: A Lot 6 Click Score: 16   End of Session Equipment Utilized During Treatment: Gait belt;Rolling walker;Other (comment)(BSC)  Activity Tolerance: Patient tolerated treatment well Patient left: in chair;with call bell/phone within reach;with chair alarm set  OT Visit Diagnosis: Unsteadiness on feet (R26.81);Other abnormalities of gait and mobility (R26.89);Muscle weakness (generalized) (M62.81);Pain;History of falling (Z91.81) Pain - Right/Left: Right Pain - part of body: Leg;Ankle and joints of foot                Time: 4818-5631 OT Time Calculation (min): 25 min Charges:  OT General Charges $OT Visit: 1 Visit OT Evaluation $OT Eval Moderate Complexity: 1 Mod OT Treatments $Self Care/Home Management : 8-22 mins    Britt Bottom 05/10/2018, 1:53 PM

## 2018-05-10 NOTE — Progress Notes (Signed)
Physical Therapy Treatment Patient Details Name: Shannon Higgins. Geron MRN: [id number] DOB: [birth year] Today's Date: 05/10/2018    History of Present Illness Pt is a 84 y.o. female admitted after falling with R foot injury. Worked up for orthostatic hypotension. CT head and C-spine were unremarkable. X-rays revealed right tibia medial malleolus fx; splint placed. PMH Includes DM, HTN, neuropathic pain, sleep apnea, depression, breast CA in remission, recurrent UTIs.   PT Comments    Pt slowly progressing with mobility. Able to perform stand pivot transfers with RW and modA. Pt limited by inability to maintain RLE NWB secondary to c/o L knee pain despite multimodal cues and assist. Difficult to progress gait training secondary to this. Educ on BLE therex. Pt motivated to discharge to rehab.    Follow Up Recommendations  SNF;Supervision/Assistance - 24 hour     Equipment Recommendations  None recommended by PT    Recommendations for Other Services       Precautions / Restrictions Precautions Precautions: Fall Required Braces or Orthoses: Splint/Cast Splint/Cast: R foot Restrictions Weight Bearing Restrictions: Yes RLE Weight Bearing: Non weight bearing    Mobility  Bed Mobility Overal bed mobility: Needs Assistance Bed Mobility: Supine to Sit     Supine to sit: Min assist     General bed mobility comments: Increased time and effort; minA for HHA to elevate trunk  Transfers Overall transfer level: Needs assistance Equipment used: Rolling walker (2 wheeled) Transfers: Sit to/from [facility] Sit to Stand: Mod assist;From elevated surface Stand pivot transfers: Mod assist       General transfer comment: Pt required modA to assist trunk elevation standing from bed and BSC; repeated cues for correct hand placement. MinA to steady; cues for RLE NWB, but pt continuing to place foot on ground.   Ambulation/Gait             General Gait Details: Pt  attempting pivotal steps bed>BSC and BSC>recliner, WB through RLE despite cues. Pt saying "I have to do it this way because of my painful left knee". Further gait training deferred secondary to pt unable to maintain precautions   Stairs             Wheelchair Mobility    Modified Rankin (Stroke Patients Only)       Balance Overall balance assessment: Needs assistance   Sitting balance-Leahy Scale: Fair     Standing balance support: Bilateral upper extremity supported Standing balance-Leahy Scale: Poor Standing balance comment: UE support and assist for balance with NWB R LE                            Cognition Arousal/Alertness: Awake/alert Behavior During Therapy: WFL for tasks assessed/performed Overall Cognitive Status: Within Functional Limits for tasks assessed                                 General Comments: WFL for tasks, but pt with poor awareness of importance of RLE NWB precautions, choosing not to try to keep with transfers      Exercises Other Exercises Other Exercises: While seated in recliner, performed RLE SLR and hip/knee flexion (cues to keep from WB through foot on chair with "heel slides")    General Comments        Pertinent Vitals/Pain Pain Assessment: Faces Faces Pain Scale: Hurts little more Pain Location: R foot, L knee Pain Descriptors /  Indicators: Sore;Grimacing Pain Intervention(s): Monitored during session;Limited activity within patient's tolerance    Home Living                      Prior Function            PT Goals (current goals can now be found in the care plan section) Acute Rehab PT Goals Patient Stated Goal: to go to rehab PT Goal Formulation: With patient/family Time For Goal Achievement: 05/22/18 Potential to Achieve Goals: Good Progress towards PT goals: Progressing toward goals    Frequency    Min 3X/week      PT Plan Current plan remains appropriate     Co-evaluation              AM-PAC PT "6 Clicks" Mobility   Outcome Measure  Help needed turning from your back to your side while in a flat bed without using bedrails?: A Little Help needed moving from lying on your back to sitting on the side of a flat bed without using bedrails?: A Little Help needed moving to and from a bed to a chair (including a wheelchair)?: A Lot Help needed standing up from a chair using your arms (e.g., wheelchair or bedside chair)?: A Lot Help needed to walk in hospital room?: Total Help needed climbing 3-5 steps with a railing? : Total 6 Click Score: 12    End of Session Equipment Utilized During Treatment: Gait belt Activity Tolerance: Patient tolerated treatment well;Other (comment)(Limited by inability to maintain RLE NWB precautions) Patient left: in chair;with call bell/phone within reach;with chair alarm set Nurse Communication: Mobility status PT Visit Diagnosis: Difficulty in walking, not elsewhere classified (R26.2);Pain;Muscle weakness (generalized) (M62.81) Pain - Right/Left: Right Pain - part of body: Ankle and joints of foot     Time: 6045-4098 PT Time Calculation (min) (ACUTE ONLY): 21 min  Charges:  $Therapeutic Activity: 8-22 mins                    Mabeline Caras, PT, DPT Acute Rehabilitation Services  Pager [phone] Office [phone]  Derry Lory 05/10/2018, 11:59 AM

## 2018-05-10 NOTE — Progress Notes (Signed)
PROGRESS NOTE                                                                                                                                                                                                             Patient Demographics:    Shannon Higgins, is a 84 y.o. female, DOB - 07/23/1934, LEX:[id number]  Admit date - 05/07/2018   Admitting Physician Rise Patience, MD  Outpatient Primary MD for the patient is Pleas Koch, NP  LOS - 0  Outpatient Specialists: none  Chief Complaint  Patient presents with  . Fall       Brief Narrative  84 year old female with history of diabetes mellitus type 2, hypertension, neuropathy pain, sleep apnea, depression, breast cancer in remission, recurrent UTIs brought to the ED after she was found to be orthostatic in the PCP office.  For the past 1 week she reported having at least 5 falls. Few days prior to admission she did hit her head without any loss of consciousness.  She again had a fall a day prior to admission where she hurt her right leg. In the ED she was orthostatic with labs showing acute kidney injury, leukocytosis and UA consistent with UTI.  CT of the head and cervical spine were unremarkable.  X-ray showed fracture of the right tibial medial malleolus.  Patient placed in observation with IV fluids and antibiotics for possible UTI.   Subjective:   No overnight events.   Assessment  & Plan :    Principal Problem:   Orthostatic hypotension Possibly due to dehydration.  Resolved with IV fluids.  Stim test negative. Off IV fluids and telemetry.  Active Problems: Acute kidney injury Resolved with IV fluids.  Recurrent falls with right medial malleolus fracture. Splint placed.  Orthopedics recommended nonweightbearing over right lower extremity and outpatient follow-up.  PT recommends SNF.  Pyuria Was empirically on IV Rocephin but asymptomatic.   Antibiotic discontinued.  Hypothyroidism On Synthroid with mildly elevated TSH of 4.9.  Increase Synthroid dose to 125 mcg daily.  Diabetes mellitus type 2 with peripheral neuropathy Stable on sliding scale coverage..  Continue Neurontin.  Holding metformin.  Dementia Continue Aricept    MDD (major depressive disorder) Continue Cymbalta and trazodone.  History of breast cancer Continue anastrozole   PT evaluation performed.  SNF recommended and is appropriate  as the patient has received 3 days of hospital care and is felt to need rehab services to restore this patient to the prior level of function to achieve safe transition back to home care.  This patient needs rehab services for at least 5 days/week and skilled nursing services daily to facilitate this transition.  Rehab is being requested as the most appropriate discharge option for this patient and is not felt to be for custodial care as evidenced by patient being independent prior to hospitalization.  Code Status : Full code  Family Communication  : None at bedside  Disposition Plan  : SNF per PT.   Barriers For Discharge : Pending insurance authorization for SNF.  Consults  : None  Procedures  : CT head and cervical spine  DVT Prophylaxis  :  Lovenox -  Lab Results  Component Value Date   PLT 330 05/09/2018    Antibiotics  :    Anti-infectives (From admission, onward)   Start     Dose/Rate Route Frequency Ordered Stop   05/08/18 2000  cefTRIAXone (ROCEPHIN) 1 g in sodium chloride 0.9 % 100 mL IVPB  Status:  Discontinued     1 g 200 mL/hr over 30 Minutes Intravenous Every 24 hours 05/08/18 0443 05/08/18 1713   05/07/18 2230  cefTRIAXone (ROCEPHIN) 1 g in sodium chloride 0.9 % 100 mL IVPB     1 g 200 mL/hr over 30 Minutes Intravenous  Once 05/07/18 2216 05/07/18 2332        Objective:   Vitals:   05/10/18 0300 05/10/18 0439 05/10/18 1001 05/10/18 1239  BP:  (!) 146/66 (!) 144/68 (!) 150/85  Pulse:  70 82  64  Resp:  18 18 18   Temp:  97.7 F (36.5 C) 98.3 F (36.8 C) 97.6 F (36.4 C)  TempSrc:  Oral Oral Oral  SpO2:  97% 99% 100%  Weight: 70.8 kg     Height:        Wt Readings from Last 3 Encounters:  05/10/18 70.8 kg  03/02/18 74.6 kg  02/23/18 74.8 kg     Intake/Output Summary (Last 24 hours) at 05/10/2018 1544 Last data filed at 05/10/2018 1431 Gross per 24 hour  Intake 720 ml  Output 1551 ml  Net -831 ml     Physical Exam  Gen: not in distress HEENT: no pallor, moist mucosa, supple neck Chest: clear b/l, no added sounds CVS: N S1&S2, no murmurs,  GI: soft, NT, ND,  Musculoskeletal: warm, splint with Ace wrap over right leg     Data Review:    CBC Recent Labs  Lab 05/07/18 1735 05/08/18 0531 05/09/18 0919  WBC 15.4* 14.2* 9.1  HGB 13.9 12.2 11.6*  HCT 44.0 39.7 36.6  PLT 433* 334 330  MCV 91.5 89.8 90.4  MCH 28.9 27.6 28.6  MCHC 31.6 30.7 31.7  RDW 14.3 14.1 14.4  LYMPHSABS  --  2.8  --   MONOABS  --  1.2*  --   EOSABS  --  0.4  --   BASOSABS  --  0.1  --     Chemistries  Recent Labs  Lab 05/07/18 1735 05/08/18 0531 05/09/18 0919  NA 131* 134* 136  K 5.0 3.9 3.7  CL 97* 101 101  CO2 20* 19* 26  GLUCOSE 173* 146* 179*  BUN 26* 26* 10  CREATININE 1.62* 1.40* 0.94  CALCIUM 9.7 8.6* 8.9  MG  --   --  1.5*  AST  --  21 17  ALT  --  14 12  ALKPHOS  --  85 71  BILITOT  --  0.5 0.5   ------------------------------------------------------------------------------------------------------------------ No results for input(s): CHOL, HDL, LDLCALC, TRIG, CHOLHDL, LDLDIRECT in the last 72 hours.  Lab Results  Component Value Date   HGBA1C 7.4 (H) 12/26/2017   ------------------------------------------------------------------------------------------------------------------ Recent Labs    05/08/18 0531  TSH 4.924*   ------------------------------------------------------------------------------------------------------------------ No results for  input(s): VITAMINB12, FOLATE, FERRITIN, TIBC, IRON, RETICCTPCT in the last 72 hours.  Coagulation profile No results for input(s): INR, PROTIME in the last 168 hours.  No results for input(s): DDIMER in the last 72 hours.  Cardiac Enzymes Recent Labs  Lab 05/08/18 0531  TROPONINI <0.03   ------------------------------------------------------------------------------------------------------------------ No results found for: BNP  Inpatient Medications  Scheduled Meds: . anastrozole  1 mg Oral Daily  . aspirin EC  81 mg Oral Daily  . brimonidine  1 drop Both Eyes TID  . donepezil  10 mg Oral Daily  . dorzolamide-timolol  1 drop Both Eyes BID  . DULoxetine  60 mg Oral Daily  . enoxaparin (LOVENOX) injection  40 mg Subcutaneous Daily  . gabapentin  800 mg Oral TID  . insulin aspart  0-9 Units Subcutaneous TID WC  . latanoprost  1 drop Both Eyes QHS  . levothyroxine  125 mcg Oral Q0600  . pantoprazole  40 mg Oral Daily  . rosuvastatin  10 mg Oral q1800   Continuous Infusions: PRN Meds:.acetaminophen **OR** acetaminophen, meclizine, ondansetron **OR** ondansetron (ZOFRAN) IV, traZODone  Micro Results Recent Results (from the past 240 hour(s))  MRSA PCR Screening     Status: None   Collection Time: 05/10/18  7:35 AM  Result Value Ref Range Status   MRSA by PCR NEGATIVE NEGATIVE Final    Comment:        The GeneXpert MRSA Assay (FDA approved for NASAL specimens only), is one component of a comprehensive MRSA colonization surveillance program. It is not intended to diagnose MRSA infection nor to guide or monitor treatment for MRSA infections. Performed at [facility], [address] [place], [place] [address]     Radiology Reports Dg Thoracic Spine 2 View  Result Date: 05/08/2018 CLINICAL DATA:  Initial evaluation for acute trauma, fall. EXAM: THORACIC SPINE 2 VIEWS COMPARISON:  None available. FINDINGS: Vertebral bodies normally aligned with preservation of the  normal thoracic kyphosis. No listhesis or malalignment. Mild chronic wedging with superimposed Schmorl's node at the superior endplate of W23, stable. Vertebral body heights otherwise maintained without acute or chronic fracture. Mild multilevel degenerative changes noted throughout the thoracic spine. Bilateral shoulder arthroplasties noted. Visualized lungs grossly clear. IMPRESSION: No radiographic evidence for acute traumatic injury within the thoracic spine. Electronically Signed   By: Jeannine Boga M.D.   On: 05/08/2018 01:21   Dg Lumbar Spine Complete  Result Date: 05/08/2018 CLINICAL DATA:  Initial evaluation for acute lower back pain status post fall. EXAM: LUMBAR SPINE - COMPLETE 4+ VIEW COMPARISON:  Prior radiograph from 01/13/2017. FINDINGS: Five non rib-bearing lumbar type vertebral bodies. Levoscoliosis with apex at L4. Trace retrolisthesis of L1 on L2, stable. Alignment otherwise within normal limits. Mild chronic wedging of the T12 vertebral body is stable. No other acute or chronic fracture. Moderate to advanced multilevel degenerative spondylolysis, grossly similar. No acute soft tissue abnormality. Prominent aortic atherosclerosis noted. Moderate retained stool within the right colon. IMPRESSION: 1. No radiographic evidence for acute traumatic injury within the lumbar spine. 2. Levoscoliosis with  moderate to advanced multilevel degenerative spondylolysis, grossly similar to previous. 3. Prominent aortic atherosclerosis. Electronically Signed   By: Jeannine Boga M.D.   On: 05/08/2018 01:17   Dg Ankle Complete Right  Result Date: 05/08/2018 CLINICAL DATA:  Initial evaluation for acute pain status post fall. EXAM: RIGHT ANKLE - COMPLETE 3+ VIEW COMPARISON:  None. FINDINGS: Small osseous fragment seen at the distal aspect of the medial malleolus, somewhat age indeterminate, but could reflect a small avulsion fracture. No other acute fracture dislocation. Ankle mortise approximated.  Talar dome intact. No significant soft tissue swelling about the ankle. IMPRESSION: 1. Small osseous fragment at the distal aspect of the medial malleolus, somewhat age indeterminate, but could reflect a small avulsion fracture. Correlation with physical exam recommended. 2. No other acute osseous abnormality about the ankle. Electronically Signed   By: Jeannine Boga M.D.   On: 05/08/2018 01:12   Ct Head Wo Contrast  Result Date: 05/08/2018 CLINICAL DATA:  Dizziness since Friday resulting in fall. Patient hit back of head without loss consciousness. Patient found to have orthostatic hypotension. EXAM: CT HEAD WITHOUT CONTRAST CT CERVICAL SPINE WITHOUT CONTRAST TECHNIQUE: Multidetector CT imaging of the head and cervical spine was performed following the standard protocol without intravenous contrast. Multiplanar CT image reconstructions of the cervical spine were also generated. COMPARISON:  None. FINDINGS: CT HEAD FINDINGS Brain: Age related cerebral atrophy with chronic stable small vessel ischemia. No intra-axial mass, midline shift or edema. No new large vascular territory infarct. No extra-axial fluid collections. No hydrocephalus. No effacement of the basal cisterns. Midline fourth ventricle. The brainstem and cerebellum are nonacute. Vascular: Atherosclerosis of the carotid siphons. No hyperdense vessel sign. Skull: Intact without skull fracture. Sinuses/Orbits: Chronic right maxillary sinus mucosal thickening with calcified desiccated mucus noted within. Mild ethmoid sinus mucosal thickening. Bilateral lens replacements. Mastoids are clear. Other: None. CT CERVICAL SPINE FINDINGS Alignment: Mild reversal cervical lordosis at C5-6 likely on the basis of degenerative disc disease and facet arthropathy. Skull base and vertebrae: No acute skull base fracture. No acute cervical spine fracture or suspicious osseous lesions. Intact atlantodental interval. Soft tissues and spinal canal: No prevertebral  fluid or swelling. No visible canal hematoma. Disc levels: Multilevel degenerative disc disease at C3-4 and from C5 through T1. No significant central foraminal encroachment. Multilevel degenerative facet arthropathy. Upper chest: Unremarkable. Minimal scarring at the left lung apex. Other: Extracranial carotid arteriosclerosis bilaterally. IMPRESSION: 1. Age related cerebral atrophy with chronic small vessel ischemia. No acute intracranial abnormality. 2. Cervical spondylosis without acute cervical spine fracture or posttraumatic listhesis. Electronically Signed   By: Ashley Royalty M.D.   On: 05/08/2018 01:51   Ct Cervical Spine Wo Contrast  Result Date: 05/08/2018 CLINICAL DATA:  Dizziness since Friday resulting in fall. Patient hit back of head without loss consciousness. Patient found to have orthostatic hypotension. EXAM: CT HEAD WITHOUT CONTRAST CT CERVICAL SPINE WITHOUT CONTRAST TECHNIQUE: Multidetector CT imaging of the head and cervical spine was performed following the standard protocol without intravenous contrast. Multiplanar CT image reconstructions of the cervical spine were also generated. COMPARISON:  None. FINDINGS: CT HEAD FINDINGS Brain: Age related cerebral atrophy with chronic stable small vessel ischemia. No intra-axial mass, midline shift or edema. No new large vascular territory infarct. No extra-axial fluid collections. No hydrocephalus. No effacement of the basal cisterns. Midline fourth ventricle. The brainstem and cerebellum are nonacute. Vascular: Atherosclerosis of the carotid siphons. No hyperdense vessel sign. Skull: Intact without skull fracture. Sinuses/Orbits: Chronic right maxillary sinus  mucosal thickening with calcified desiccated mucus noted within. Mild ethmoid sinus mucosal thickening. Bilateral lens replacements. Mastoids are clear. Other: None. CT CERVICAL SPINE FINDINGS Alignment: Mild reversal cervical lordosis at C5-6 likely on the basis of degenerative disc disease and  facet arthropathy. Skull base and vertebrae: No acute skull base fracture. No acute cervical spine fracture or suspicious osseous lesions. Intact atlantodental interval. Soft tissues and spinal canal: No prevertebral fluid or swelling. No visible canal hematoma. Disc levels: Multilevel degenerative disc disease at C3-4 and from C5 through T1. No significant central foraminal encroachment. Multilevel degenerative facet arthropathy. Upper chest: Unremarkable. Minimal scarring at the left lung apex. Other: Extracranial carotid arteriosclerosis bilaterally. IMPRESSION: 1. Age related cerebral atrophy with chronic small vessel ischemia. No acute intracranial abnormality. 2. Cervical spondylosis without acute cervical spine fracture or posttraumatic listhesis. Electronically Signed   By: Ashley Royalty M.D.   On: 05/08/2018 01:51   Dg Knee Complete 4 Views Left  Result Date: 05/08/2018 CLINICAL DATA:  Initial evaluation for acute pain status post fall. EXAM: LEFT KNEE - COMPLETE 4+ VIEW COMPARISON:  None available. FINDINGS: No acute fracture dislocation. No joint effusion. Mild-to-moderate tricompartmental degenerative osteoarthrosis. Associated chondrocalcinosis noted. No discrete osseous lesions. No soft tissue abnormality. IMPRESSION: 1. No acute fracture or dislocation. 2. Mild to moderate tricompartmental degenerative osteoarthrosis with associated chondrocalcinosis. Electronically Signed   By: Jeannine Boga M.D.   On: 05/08/2018 01:13   Dg Knee Complete 4 Views Right  Result Date: 05/08/2018 CLINICAL DATA:  Initial evaluation for acute trauma, fall. EXAM: RIGHT KNEE - COMPLETE 4+ VIEW COMPARISON:  None. FINDINGS: Right total knee arthroplasty in place. No appreciable hardware complication. No acute fracture dislocation. No joint effusion. No appreciable soft tissue injury. IMPRESSION: 1. No acute osseous abnormality about the right knee. 2. Right total knee arthroplasty in place without complication.  Electronically Signed   By: Jeannine Boga M.D.   On: 05/08/2018 01:15    Time Spent in minutes  20   Brittin Belnap M.D on 05/10/2018 at 3:44 PM  Between 7am to 7pm - Pager - [phone]  After 7pm go to [email or web address] - password [facility]  [facility] -  Office  [phone]

## 2018-05-10 NOTE — Clinical Social Work Note (Addendum)
Insurance authorization still pending.  Dayton Scrape, [place] [phone]  4:19 pm Updated patient. She will notify her daughter. CSW tried calling daughter but no answer.  Dayton Scrape, [place]

## 2018-05-11 DIAGNOSIS — E860 Dehydration: Secondary | ICD-10-CM | POA: Diagnosis not present

## 2018-05-11 DIAGNOSIS — W19XXXD Unspecified fall, subsequent encounter: Secondary | ICD-10-CM | POA: Diagnosis not present

## 2018-05-11 DIAGNOSIS — G4733 Obstructive sleep apnea (adult) (pediatric): Secondary | ICD-10-CM | POA: Diagnosis not present

## 2018-05-11 DIAGNOSIS — S8251XD Displaced fracture of medial malleolus of right tibia, subsequent encounter for closed fracture with routine healing: Secondary | ICD-10-CM | POA: Diagnosis not present

## 2018-05-11 DIAGNOSIS — F329 Major depressive disorder, single episode, unspecified: Secondary | ICD-10-CM | POA: Diagnosis not present

## 2018-05-11 DIAGNOSIS — M25571 Pain in right ankle and joints of right foot: Secondary | ICD-10-CM | POA: Diagnosis not present

## 2018-05-11 DIAGNOSIS — E1142 Type 2 diabetes mellitus with diabetic polyneuropathy: Secondary | ICD-10-CM | POA: Diagnosis not present

## 2018-05-11 DIAGNOSIS — S8254XD Nondisplaced fracture of medial malleolus of right tibia, subsequent encounter for closed fracture with routine healing: Secondary | ICD-10-CM | POA: Diagnosis not present

## 2018-05-11 DIAGNOSIS — S8251XA Displaced fracture of medial malleolus of right tibia, initial encounter for closed fracture: Secondary | ICD-10-CM | POA: Diagnosis not present

## 2018-05-11 DIAGNOSIS — G629 Polyneuropathy, unspecified: Secondary | ICD-10-CM | POA: Diagnosis not present

## 2018-05-11 DIAGNOSIS — S8253XA Displaced fracture of medial malleolus of unspecified tibia, initial encounter for closed fracture: Secondary | ICD-10-CM

## 2018-05-11 DIAGNOSIS — R52 Pain, unspecified: Secondary | ICD-10-CM | POA: Diagnosis not present

## 2018-05-11 DIAGNOSIS — F0390 Unspecified dementia without behavioral disturbance: Secondary | ICD-10-CM | POA: Diagnosis not present

## 2018-05-11 DIAGNOSIS — I10 Essential (primary) hypertension: Secondary | ICD-10-CM | POA: Diagnosis not present

## 2018-05-11 DIAGNOSIS — M109 Gout, unspecified: Secondary | ICD-10-CM | POA: Diagnosis not present

## 2018-05-11 DIAGNOSIS — C50912 Malignant neoplasm of unspecified site of left female breast: Secondary | ICD-10-CM | POA: Diagnosis not present

## 2018-05-11 DIAGNOSIS — Z7984 Long term (current) use of oral hypoglycemic drugs: Secondary | ICD-10-CM | POA: Diagnosis not present

## 2018-05-11 DIAGNOSIS — Z8781 Personal history of (healed) traumatic fracture: Secondary | ICD-10-CM | POA: Diagnosis not present

## 2018-05-11 DIAGNOSIS — N179 Acute kidney failure, unspecified: Secondary | ICD-10-CM | POA: Diagnosis not present

## 2018-05-11 DIAGNOSIS — K219 Gastro-esophageal reflux disease without esophagitis: Secondary | ICD-10-CM | POA: Diagnosis not present

## 2018-05-11 DIAGNOSIS — E785 Hyperlipidemia, unspecified: Secondary | ICD-10-CM | POA: Diagnosis not present

## 2018-05-11 DIAGNOSIS — E039 Hypothyroidism, unspecified: Secondary | ICD-10-CM | POA: Diagnosis not present

## 2018-05-11 DIAGNOSIS — R296 Repeated falls: Secondary | ICD-10-CM | POA: Diagnosis not present

## 2018-05-11 DIAGNOSIS — J45909 Unspecified asthma, uncomplicated: Secondary | ICD-10-CM | POA: Diagnosis not present

## 2018-05-11 DIAGNOSIS — E119 Type 2 diabetes mellitus without complications: Secondary | ICD-10-CM | POA: Insufficient documentation

## 2018-05-11 DIAGNOSIS — E1140 Type 2 diabetes mellitus with diabetic neuropathy, unspecified: Secondary | ICD-10-CM | POA: Diagnosis not present

## 2018-05-11 DIAGNOSIS — I951 Orthostatic hypotension: Secondary | ICD-10-CM | POA: Diagnosis not present

## 2018-05-11 HISTORY — DX: Displaced fracture of medial malleolus of unspecified tibia, initial encounter for closed fracture: S82.53XA

## 2018-05-11 LAB — GLUCOSE, CAPILLARY
Glucose-Capillary: 119 mg/dL — ABNORMAL HIGH (ref 70–99)
Glucose-Capillary: 162 mg/dL — ABNORMAL HIGH (ref 70–99)

## 2018-05-11 MED ORDER — LEVOTHYROXINE SODIUM 125 MCG PO TABS: 125.0000 ug | ORAL_TABLET | Freq: Every day | ORAL | 0 refills | Status: DC

## 2018-05-11 NOTE — Clinical Social Work Note (Signed)
CSW facilitated patient discharge including contacting patient family and facility to confirm patient discharge plans. Clinical information faxed to facility and family agreeable with plan. Patient's daughter [name] will transport by car to [facility]. RN to call report prior to discharge [phone] Room [phone]).  CSW will sign off for now as social work intervention is no longer needed. Please consult [place] again if new needs arise.  Dayton Scrape, [place]

## 2018-05-11 NOTE — Clinical Social Work Placement (Signed)
   CLINICAL SOCIAL WORK PLACEMENT  NOTE  Date:  05/11/2018  Patient Details  Name: Shannon Higgins MRN: [id number] Date of Birth: 1935/04/06  Clinical Social Work is seeking post-discharge placement for this patient at the [place] level of care (*CSW will initial, date and re-position this form in  chart as items are completed):      Patient/family provided with [place] Work Department's list of facilities offering this level of care within the geographic area requested by the patient (or if unable, by the patient's family).      Patient/family informed of their freedom to choose among providers that offer the needed level of care, that participate in Medicare, Medicaid or managed care program needed by the patient, have an available bed and are willing to accept the patient.      Patient/family informed of [facility]'s ownership interest in [facility] and [facility], as well as of the fact that they are under no obligation to receive care at these facilities.  PASRR submitted to EDS on 05/09/18     PASRR number received on       Existing PASRR number confirmed on 05/09/18     FL2 transmitted to all facilities in geographic area requested by pt/family on 05/09/18     FL2 transmitted to all facilities within larger geographic area on       Patient informed that his/her managed care company has contracts with or will negotiate with certain facilities, including the following:        Yes   Patient/family informed of bed offers received.  Patient chooses bed at [facility]     Physician recommends and patient chooses bed at      Patient to be transferred to [facility] on 05/11/18.  Patient to be transferred to facility by Daughter will transport by car     Patient family notified on 05/11/18 of transfer.  Name of family member notified:  [name]     PHYSICIAN Please prepare prescriptions      Additional Comment:    _______________________________________________ Candie Chroman, LCSW 05/11/2018, 3:48 PM

## 2018-05-11 NOTE — Discharge Summary (Signed)
Physician Discharge Summary  Shannon Higgins MRN:[id number] DOB: 04/18/1935 DOA: 05/07/2018  PCP: Clark, Katherine K, NP  Admit date: 05/07/2018 Discharge date: 05/11/2018  Admitted From: Home Disposition: Skilled nursing facility  Recommendations for Outpatient Follow-up:  1. F follow-up with MD at SNF in 1 week 2. Follow-up with orthopedic surgery Dr. Hewitt within 1 week     Equipment/Devices: As per therapy at the facility. Nonweightbearing on right lower leg until seen by orthopedic surgery  Discharge Condition: Fair CODE STATUS: Full code Diet recommendation: Carb modified    Discharge Diagnoses:  Principal Problem:   Orthostatic hypotension  Active Problems:   HTN (hypertension)   OSA (obstructive sleep apnea)   AKI (acute kidney injury) (HCC)   Hypothyroidism   MDD (major depressive disorder)   Closed avulsion fracture of medial malleolus, right, with routine healing, subsequent encounter   Type 2 diabetes mellitus with peripheral neuropathy (HCC)   Brief narrative/HPI 84-year-old female with history of diabetes mellitus type 2, hypertension, neuropathy pain, sleep apnea, depression, breast cancer in remission, recurrent UTIs brought to the ED after she was found to be orthostatic in the PCP office.  For the past 1 week she reported having at least 5 falls. Few days prior to admission she did hit her head without any loss of consciousness.  She again had a fall a day prior to admission where she hurt her right leg. In the ED she was orthostatic with labs showing acute kidney injury, leukocytosis and UA consistent with UTI.  CT of the head and cervical spine were unremarkable.  X-ray showed fracture of the right tibial medial malleolus.  Patient placed in observation with IV fluids and antibiotics for possible UTI.   Hospital course Principal Problem:   Orthostatic hypotension Possibly due to dehydration.  Resolved with IV fluids.  Stim test negative.   Active  Problems: Acute kidney injury Secondary to dehydration.  Resolved with IV fluids.  Recurrent falls with right medial malleolus fracture. Splint placed.  Orthopedics recommended nonweightbearing over right lower extremity and outpatient follow-up in 1 week.  PT recommends SNF. Has not required any pain medications.  Can use Tylenol as needed for pain.  Pyuria Was empirically on IV Rocephin but asymptomatic.  Antibiotic discontinued.  Hypothyroidism On Synthroid with mildly elevated TSH of 4.9.  Increase Synthroid dose to 125 mcg daily.  Diabetes mellitus type 2 with peripheral neuropathy Stable on sliding scale coverage while in the hospital.  Continue Neurontin.  Resume metformin upon discharge.  Dementia Continue Aricept    MDD (major depressive disorder) Continue Cymbalta and trazodone.  History of breast cancer Continue anastrozole  Procedure: CT head and cervical spine Consults: None   Family communication: None at bedside Disposition: Skilled nursing facility  Discharge Instructions   Allergies as of 05/11/2018   No Known Allergies     Medication List    STOP taking these medications   amoxicillin 500 MG capsule Commonly known as:  AMOXIL   ibuprofen 200 MG tablet Commonly known as:  ADVIL,MOTRIN   meloxicam 15 MG tablet Commonly known as:  MOBIC     TAKE these medications   anastrozole 1 MG tablet Commonly known as:  ARIMIDEX Take 1 mg by mouth daily.   aspirin 81 MG tablet Take 81 mg by mouth daily.   blood glucose meter kit and supplies Dispense based on patient and insurance preference. Use up to 2 times daily as directed. (FOR ICD-10 E10.9, E11.9).   brimonidine 0.2 % ophthalmic   solution Commonly known as:  ALPHAGAN Place 1 drop into both eyes 3 (three) times daily. Both eyes   cetirizine 10 MG chewable tablet Commonly known as:  ZYRTEC Chew 1 tablet by mouth once daily as needed for allergies.   donepezil 10 MG tablet Commonly  known as:  ARICEPT Take 1 tablet daily What changed:    how much to take  how to take this  when to take this  additional instructions   dorzolamide-timolol 22.3-6.8 MG/ML ophthalmic solution Commonly known as:  COSOPT Place 1 drop into both eyes 2 (two) times daily.   DULoxetine 60 MG capsule Commonly known as:  CYMBALTA Take 1 capsule by mouth once daily for depression.   fluticasone 50 MCG/ACT nasal spray Commonly known as:  FLONASE Place 2 sprays into both nostrils daily. What changed:    when to take this  reasons to take this   gabapentin 800 MG tablet Commonly known as:  NEURONTIN Take 1 capsule by mouth three times daily for neuropathy.   latanoprost 0.005 % ophthalmic solution Commonly known as:  XALATAN Place 1 drop into both eyes at bedtime.   levothyroxine 125 MCG tablet Commonly known as:  SYNTHROID Take 1 tablet (125 mcg total) by mouth daily before breakfast. Take 1 tablet by mouth every morning on an empty stomach with a full glass of water. No food or other medications for 30 minutes. What changed:    medication strength  how much to take  how to take this  when to take this   meclizine 25 MG tablet Commonly known as:  ANTIVERT Take 1 tablet (25 mg total) by mouth 2 (two) times daily as needed for dizziness.   metFORMIN 500 MG tablet Commonly known as:  GLUCOPHAGE Take 1 tablet by mouth twice daily with food for diabetes.   omeprazole 20 MG capsule Commonly known as:  PRILOSEC Take 1 capsule by mouth twice daily for heartburn.   propranolol 80 MG tablet Commonly known as:  INDERAL Take 1 tablet by mouth once daily for blood pressure.   rosuvastatin 10 MG tablet Commonly known as:  CRESTOR Take 1 tablet by mouth once daily for cholesterol.   SUMAtriptan 25 MG tablet Commonly known as:  IMITREX Take as needed for migraine. Do not take more than twice a week What changed:    how much to take  how to take this  when to take  this  reasons to take this  additional instructions   traZODone 100 MG tablet Commonly known as:  DESYREL Take 100-200 mg by mouth at bedtime as needed for sleep.       Contact information for follow-up providers    Hewitt, John, MD. Schedule an appointment as soon as possible for a visit in 1 week(s).   Specialty:  Orthopedic Surgery Contact information: [address] [place] [address] [phone]            Contact information for after-discharge care    Destination    [facility] .   Service:  Skilled Nursing Contact information: [address]  [place] [address] [phone]                 No Known Allergies      Procedures/Studies: Dg Thoracic Spine 2 View  Result Date: 05/08/2018 CLINICAL DATA:  Initial evaluation for acute trauma, fall. EXAM: THORACIC SPINE 2 VIEWS COMPARISON:  None available. FINDINGS: Vertebral bodies normally aligned with preservation of   the normal thoracic kyphosis. No listhesis or malalignment. Mild chronic wedging with superimposed Schmorl's node at the superior endplate of O84, stable. Vertebral body heights otherwise maintained without acute or chronic fracture. Mild multilevel degenerative changes noted throughout the thoracic spine. Bilateral shoulder arthroplasties noted. Visualized lungs grossly clear. IMPRESSION: No radiographic evidence for acute traumatic injury within the thoracic spine. Electronically Signed   By: Jeannine Boga M.D.   On: 05/08/2018 01:21   Dg Lumbar Spine Complete  Result Date: 05/08/2018 CLINICAL DATA:  Initial evaluation for acute lower back pain status post fall. EXAM: LUMBAR SPINE - COMPLETE 4+ VIEW COMPARISON:  Prior radiograph from 01/13/2017. FINDINGS: Five non rib-bearing lumbar type vertebral bodies. Levoscoliosis with apex at L4. Trace retrolisthesis of L1 on L2, stable. Alignment otherwise within normal limits. Mild chronic  wedging of the T12 vertebral body is stable. No other acute or chronic fracture. Moderate to advanced multilevel degenerative spondylolysis, grossly similar. No acute soft tissue abnormality. Prominent aortic atherosclerosis noted. Moderate retained stool within the right colon. IMPRESSION: 1. No radiographic evidence for acute traumatic injury within the lumbar spine. 2. Levoscoliosis with moderate to advanced multilevel degenerative spondylolysis, grossly similar to previous. 3. Prominent aortic atherosclerosis. Electronically Signed   By: Jeannine Boga M.D.   On: 05/08/2018 01:17   Dg Ankle Complete Right  Result Date: 05/08/2018 CLINICAL DATA:  Initial evaluation for acute pain status post fall. EXAM: RIGHT ANKLE - COMPLETE 3+ VIEW COMPARISON:  None. FINDINGS: Small osseous fragment seen at the distal aspect of the medial malleolus, somewhat age indeterminate, but could reflect a small avulsion fracture. No other acute fracture dislocation. Ankle mortise approximated. Talar dome intact. No significant soft tissue swelling about the ankle. IMPRESSION: 1. Small osseous fragment at the distal aspect of the medial malleolus, somewhat age indeterminate, but could reflect a small avulsion fracture. Correlation with physical exam recommended. 2. No other acute osseous abnormality about the ankle. Electronically Signed   By: Jeannine Boga M.D.   On: 05/08/2018 01:12   Ct Head Wo Contrast  Result Date: 05/08/2018 CLINICAL DATA:  Dizziness since Friday resulting in fall. Patient hit back of head without loss consciousness. Patient found to have orthostatic hypotension. EXAM: CT HEAD WITHOUT CONTRAST CT CERVICAL SPINE WITHOUT CONTRAST TECHNIQUE: Multidetector CT imaging of the head and cervical spine was performed following the standard protocol without intravenous contrast. Multiplanar CT image reconstructions of the cervical spine were also generated. COMPARISON:  None. FINDINGS: CT HEAD FINDINGS  Brain: Age related cerebral atrophy with chronic stable small vessel ischemia. No intra-axial mass, midline shift or edema. No new large vascular territory infarct. No extra-axial fluid collections. No hydrocephalus. No effacement of the basal cisterns. Midline fourth ventricle. The brainstem and cerebellum are nonacute. Vascular: Atherosclerosis of the carotid siphons. No hyperdense vessel sign. Skull: Intact without skull fracture. Sinuses/Orbits: Chronic right maxillary sinus mucosal thickening with calcified desiccated mucus noted within. Mild ethmoid sinus mucosal thickening. Bilateral lens replacements. Mastoids are clear. Other: None. CT CERVICAL SPINE FINDINGS Alignment: Mild reversal cervical lordosis at C5-6 likely on the basis of degenerative disc disease and facet arthropathy. Skull base and vertebrae: No acute skull base fracture. No acute cervical spine fracture or suspicious osseous lesions. Intact atlantodental interval. Soft tissues and spinal canal: No prevertebral fluid or swelling. No visible canal hematoma. Disc levels: Multilevel degenerative disc disease at C3-4 and from C5 through T1. No significant central foraminal encroachment. Multilevel degenerative facet arthropathy. Upper chest: Unremarkable. Minimal scarring at the left lung  apex. Other: Extracranial carotid arteriosclerosis bilaterally. IMPRESSION: 1. Age related cerebral atrophy with chronic small vessel ischemia. No acute intracranial abnormality. 2. Cervical spondylosis without acute cervical spine fracture or posttraumatic listhesis. Electronically Signed   By: Ashley Royalty M.D.   On: 05/08/2018 01:51   Ct Cervical Spine Wo Contrast  Result Date: 05/08/2018 CLINICAL DATA:  Dizziness since Friday resulting in fall. Patient hit back of head without loss consciousness. Patient found to have orthostatic hypotension. EXAM: CT HEAD WITHOUT CONTRAST CT CERVICAL SPINE WITHOUT CONTRAST TECHNIQUE: Multidetector CT imaging of the head and  cervical spine was performed following the standard protocol without intravenous contrast. Multiplanar CT image reconstructions of the cervical spine were also generated. COMPARISON:  None. FINDINGS: CT HEAD FINDINGS Brain: Age related cerebral atrophy with chronic stable small vessel ischemia. No intra-axial mass, midline shift or edema. No new large vascular territory infarct. No extra-axial fluid collections. No hydrocephalus. No effacement of the basal cisterns. Midline fourth ventricle. The brainstem and cerebellum are nonacute. Vascular: Atherosclerosis of the carotid siphons. No hyperdense vessel sign. Skull: Intact without skull fracture. Sinuses/Orbits: Chronic right maxillary sinus mucosal thickening with calcified desiccated mucus noted within. Mild ethmoid sinus mucosal thickening. Bilateral lens replacements. Mastoids are clear. Other: None. CT CERVICAL SPINE FINDINGS Alignment: Mild reversal cervical lordosis at C5-6 likely on the basis of degenerative disc disease and facet arthropathy. Skull base and vertebrae: No acute skull base fracture. No acute cervical spine fracture or suspicious osseous lesions. Intact atlantodental interval. Soft tissues and spinal canal: No prevertebral fluid or swelling. No visible canal hematoma. Disc levels: Multilevel degenerative disc disease at C3-4 and from C5 through T1. No significant central foraminal encroachment. Multilevel degenerative facet arthropathy. Upper chest: Unremarkable. Minimal scarring at the left lung apex. Other: Extracranial carotid arteriosclerosis bilaterally. IMPRESSION: 1. Age related cerebral atrophy with chronic small vessel ischemia. No acute intracranial abnormality. 2. Cervical spondylosis without acute cervical spine fracture or posttraumatic listhesis. Electronically Signed   By: Ashley Royalty M.D.   On: 05/08/2018 01:51   Dg Knee Complete 4 Views Left  Result Date: 05/08/2018 CLINICAL DATA:  Initial evaluation for acute pain status  post fall. EXAM: LEFT KNEE - COMPLETE 4+ VIEW COMPARISON:  None available. FINDINGS: No acute fracture dislocation. No joint effusion. Mild-to-moderate tricompartmental degenerative osteoarthrosis. Associated chondrocalcinosis noted. No discrete osseous lesions. No soft tissue abnormality. IMPRESSION: 1. No acute fracture or dislocation. 2. Mild to moderate tricompartmental degenerative osteoarthrosis with associated chondrocalcinosis. Electronically Signed   By: Jeannine Boga M.D.   On: 05/08/2018 01:13   Dg Knee Complete 4 Views Right  Result Date: 05/08/2018 CLINICAL DATA:  Initial evaluation for acute trauma, fall. EXAM: RIGHT KNEE - COMPLETE 4+ VIEW COMPARISON:  None. FINDINGS: Right total knee arthroplasty in place. No appreciable hardware complication. No acute fracture dislocation. No joint effusion. No appreciable soft tissue injury. IMPRESSION: 1. No acute osseous abnormality about the right knee. 2. Right total knee arthroplasty in place without complication. Electronically Signed   By: Jeannine Boga M.D.   On: 05/08/2018 01:15       Subjective: No overnight events.  Denies any symptoms.  Discharge Exam: Vitals:   05/11/18 0632 05/11/18 0858  BP: 124/71 (!) 150/67  Pulse: 71 80  Resp: 18   Temp: 98.3 F (36.8 C)   SpO2: 96%    Vitals:   05/11/18 0133 05/11/18 0138 05/11/18 0632 05/11/18 0858  BP: 139/72  124/71 (!) 150/67  Pulse: 74  71 80  Resp:  18  18   Temp: 98.3 F (36.8 C)  98.3 F (36.8 C)   TempSrc: Oral  Oral   SpO2: 97%  96%   Weight:  75.8 kg    Height:        General: Elderly female not in distress HEENT: Moist mucosa, supple neck Chest: Clear bilaterally CVs: Normal S1-S2, no murmurs GI: Soft, nondistended, nontender Musculoskeletal: Warm, no edema, splint with Ace wrap over right foot, normal sensation and peripheral circulation. CNS: Alert and oriented, nonfocal     The results of significant diagnostics from this hospitalization  (including imaging, microbiology, ancillary and laboratory) are listed below for reference.     Microbiology: Recent Results (from the past 240 hour(s))  MRSA PCR Screening     Status: None   Collection Time: 05/10/18  7:35 AM  Result Value Ref Range Status   MRSA by PCR NEGATIVE NEGATIVE Final    Comment:        The GeneXpert MRSA Assay (FDA approved for NASAL specimens only), is one component of a comprehensive MRSA colonization surveillance program. It is not intended to diagnose MRSA infection nor to guide or monitor treatment for MRSA infections. Performed at [facility], [place] [address] [place], [place] [address]      Labs: BNP (last 3 results) No results for input(s): BNP in the last 8760 hours. Basic Metabolic Panel: Recent Labs  Lab 05/07/18 1735 05/08/18 0531 05/09/18 0919  NA 131* 134* 136  K 5.0 3.9 3.7  CL 97* 101 101  CO2 20* 19* 26  GLUCOSE 173* 146* 179*  BUN 26* 26* 10  CREATININE 1.62* 1.40* 0.94  CALCIUM 9.7 8.6* 8.9  MG  --   --  1.5*   Liver Function Tests: Recent Labs  Lab 05/08/18 0531 05/09/18 0919  AST 21 17  ALT 14 12  ALKPHOS 85 71  BILITOT 0.5 0.5  PROT 6.4* 6.3*  ALBUMIN 3.4* 3.1*   No results for input(s): LIPASE, AMYLASE in the last 168 hours. No results for input(s): AMMONIA in the last 168 hours. CBC: Recent Labs  Lab 05/07/18 1735 05/08/18 0531 05/09/18 0919  WBC 15.4* 14.2* 9.1  NEUTROABS  --  9.8*  --   HGB 13.9 12.2 11.6*  HCT 44.0 39.7 36.6  MCV 91.5 89.8 90.4  PLT 433* 334 330   Cardiac Enzymes: Recent Labs  Lab 05/08/18 0531  CKTOTAL 67  TROPONINI <0.03   BNP: Invalid input(s): POCBNP CBG: Recent Labs  Lab 05/10/18 1221 05/10/18 1637 05/10/18 2140 05/11/18 0755 05/11/18 1159  GLUCAP 205* 156* 120* 119* 162*   D-Dimer No results for input(s): DDIMER in the last 72 hours. Hgb A1c No results for input(s): HGBA1C in the last 72 hours. Lipid Profile No results for input(s): CHOL,  HDL, LDLCALC, TRIG, CHOLHDL, LDLDIRECT in the last 72 hours. Thyroid function studies No results for input(s): TSH, T4TOTAL, T3FREE, THYROIDAB in the last 72 hours.  Invalid input(s): FREET3 Anemia work up No results for input(s): VITAMINB12, FOLATE, FERRITIN, TIBC, IRON, RETICCTPCT in the last 72 hours. Urinalysis    Component Value Date/Time   COLORURINE AMBER (A) 05/07/2018 2117   APPEARANCEUR CLOUDY (A) 05/07/2018 2117   LABSPEC 1.016 05/07/2018 2117   PHURINE 5.0 05/07/2018 2117   GLUCOSEU NEGATIVE 05/07/2018 2117   HGBUR NEGATIVE 05/07/2018 2117   BILIRUBINUR NEGATIVE 05/07/2018 2117   BILIRUBINUR 2+ 03/02/2018 [place] 05/07/2018 2117   PROTEINUR 30 (A) 05/07/2018 2117  UROBILINOGEN 1.0 03/02/2018 1239   UROBILINOGEN 0.2 04/26/2012 1547   NITRITE NEGATIVE 05/07/2018 2117   LEUKOCYTESUR MODERATE (A) 05/07/2018 2117   Sepsis Labs Invalid input(s): PROCALCITONIN,  WBC,  LACTICIDVEN Microbiology Recent Results (from the past 240 hour(s))  MRSA PCR Screening     Status: None   Collection Time: 05/10/18  7:35 AM  Result Value Ref Range Status   MRSA by PCR NEGATIVE NEGATIVE Final    Comment:        The GeneXpert MRSA Assay (FDA approved for NASAL specimens only), is one component of a comprehensive MRSA colonization surveillance program. It is not intended to diagnose MRSA infection nor to guide or monitor treatment for MRSA infections. Performed at [facility], [address] [place], [place] [address]      Time coordinating discharge: <30 minutes  SIGNED:    , MD  [facility] 05/11/2018, 3:23 PM Pager   If 7PM-7AM, please contact night-coverage [email or web address] Password [email or web address]  

## 2018-05-11 NOTE — Progress Notes (Signed)
RN called [employer] for report twice, but no one responded.

## 2018-05-11 NOTE — Clinical Social Work Note (Addendum)
Insurance authorization still pending.  Dayton Scrape, [place]  2:04 pm Auth still pending.  Dayton Scrape, [place] [phone]  3:14 pm Insurance authorization approved. Paged MD to notify.  Dayton Scrape, [place]

## 2018-05-14 DIAGNOSIS — Z8781 Personal history of (healed) traumatic fracture: Secondary | ICD-10-CM

## 2018-05-14 DIAGNOSIS — R296 Repeated falls: Secondary | ICD-10-CM | POA: Diagnosis not present

## 2018-05-14 DIAGNOSIS — F0390 Unspecified dementia without behavioral disturbance: Secondary | ICD-10-CM | POA: Diagnosis not present

## 2018-05-14 DIAGNOSIS — M109 Gout, unspecified: Secondary | ICD-10-CM | POA: Diagnosis not present

## 2018-05-14 HISTORY — DX: Personal history of (healed) traumatic fracture: Z87.81

## 2018-05-16 ENCOUNTER — Ambulatory Visit: Payer: Self-pay | Admitting: Urology

## 2018-05-21 DIAGNOSIS — S8251XA Displaced fracture of medial malleolus of right tibia, initial encounter for closed fracture: Secondary | ICD-10-CM | POA: Diagnosis not present

## 2018-05-21 DIAGNOSIS — M25571 Pain in right ankle and joints of right foot: Secondary | ICD-10-CM | POA: Diagnosis not present

## 2018-05-24 DIAGNOSIS — G629 Polyneuropathy, unspecified: Secondary | ICD-10-CM | POA: Diagnosis not present

## 2018-05-24 DIAGNOSIS — R52 Pain, unspecified: Secondary | ICD-10-CM | POA: Diagnosis not present

## 2018-05-24 DIAGNOSIS — E119 Type 2 diabetes mellitus without complications: Secondary | ICD-10-CM | POA: Diagnosis not present

## 2018-05-24 DIAGNOSIS — S8251XD Displaced fracture of medial malleolus of right tibia, subsequent encounter for closed fracture with routine healing: Secondary | ICD-10-CM | POA: Diagnosis not present

## 2018-05-28 DIAGNOSIS — H401133 Primary open-angle glaucoma, bilateral, severe stage: Secondary | ICD-10-CM | POA: Diagnosis not present

## 2018-05-29 ENCOUNTER — Telehealth: Payer: Self-pay | Admitting: *Deleted

## 2018-05-29 DIAGNOSIS — E119 Type 2 diabetes mellitus without complications: Secondary | ICD-10-CM | POA: Diagnosis not present

## 2018-05-29 DIAGNOSIS — Z9181 History of falling: Secondary | ICD-10-CM | POA: Diagnosis not present

## 2018-05-29 DIAGNOSIS — Z7982 Long term (current) use of aspirin: Secondary | ICD-10-CM | POA: Diagnosis not present

## 2018-05-29 DIAGNOSIS — Z87440 Personal history of urinary (tract) infections: Secondary | ICD-10-CM | POA: Diagnosis not present

## 2018-05-29 DIAGNOSIS — R296 Repeated falls: Secondary | ICD-10-CM | POA: Diagnosis not present

## 2018-05-29 DIAGNOSIS — S8251XD Displaced fracture of medial malleolus of right tibia, subsequent encounter for closed fracture with routine healing: Secondary | ICD-10-CM | POA: Diagnosis not present

## 2018-05-29 DIAGNOSIS — I10 Essential (primary) hypertension: Secondary | ICD-10-CM | POA: Diagnosis not present

## 2018-05-29 DIAGNOSIS — Z7984 Long term (current) use of oral hypoglycemic drugs: Secondary | ICD-10-CM | POA: Diagnosis not present

## 2018-05-29 DIAGNOSIS — I951 Orthostatic hypotension: Secondary | ICD-10-CM | POA: Diagnosis not present

## 2018-05-29 NOTE — Telephone Encounter (Signed)
Shannon Higgins with [facility] left a voicemail requesting verbal orders. Shannon Higgins stated that patient is being admitted into their care from the hospital and rehab. Shannon Higgins requested an order for Home Care Services plan of care for: Twice a week for one week  Once a week for 3 weeks    One as needed visit

## 2018-05-29 NOTE — Telephone Encounter (Signed)
Noted and approved

## 2018-05-30 NOTE — Telephone Encounter (Signed)
Gave the approval for the verbal orders 

## 2018-06-01 ENCOUNTER — Encounter: Payer: Self-pay | Admitting: Primary Care

## 2018-06-01 ENCOUNTER — Ambulatory Visit (INDEPENDENT_AMBULATORY_CARE_PROVIDER_SITE_OTHER): Payer: Medicare HMO | Admitting: Primary Care

## 2018-06-01 VITALS — BP 124/82 | HR 61 | Temp 98.0°F | Ht 64.0 in | Wt 160.5 lb

## 2018-06-01 DIAGNOSIS — R350 Frequency of micturition: Secondary | ICD-10-CM | POA: Diagnosis not present

## 2018-06-01 LAB — POC URINALSYSI DIPSTICK (AUTOMATED)
BILIRUBIN UA: NEGATIVE
Blood, UA: NEGATIVE
Glucose, UA: NEGATIVE
KETONES UA: NEGATIVE
Leukocytes, UA: NEGATIVE
Nitrite, UA: NEGATIVE
Protein, UA: POSITIVE — AB
Spec Grav, UA: 1.01 (ref 1.010–1.025)
Urobilinogen, UA: 0.2 E.U./dL
pH, UA: 7.5 (ref 5.0–8.0)

## 2018-06-01 NOTE — Patient Instructions (Signed)
Make sure to drink plenty of water.  Monitor your blood sugars and notify me if you see readings at or above 200 consistently.  We will see you next month as scheduled.  It was a pleasure to see you today!

## 2018-06-01 NOTE — Progress Notes (Signed)
Subjective:    Patient ID: Shannon Higgins, female    DOB: 1935-04-30, 84 y.o.   MRN: [id number]  HPI  [name] is an 84 year old female with a history of type 2 diabetes, chronic lower back pain, recurrent UTI, confusion who presents today with a chief complaint of urinary frequency.  She also reports foul smelling urine, achy feeling in the pelvis. She denies dysuria, hematuria, fevers, vaginal discharge. She has not yet seen the urologist, has had to reschedule twice since referred. She plans on rescheduling.   Her daughter endorses that they were told that she may have an early UTI while in the ED on 05/07/18, was provided with IV medication once. She has not checked her glucose readings since out of the hospital.  Review of Systems  Constitutional: Negative for fever.  Gastrointestinal: Negative for abdominal pain.  Genitourinary: Positive for frequency. Negative for dysuria, flank pain and hematuria.       Pelvic pressure       Past Medical History:  Diagnosis Date  . Asthma   . Breast cancer ([place])   . Chronic sinusitis   . Diabetes mellitus without complication ([place])   . GERD (gastroesophageal reflux disease)   . History of recurrent UTIs   . Hypertension   . Insomnia   . Migraine   . Neuropathic pain   . Pneumonia   . Rheumatoid arthritis ([place])   . Sleep apnea   . Stroke ([place])   . Thyroid disease      Social History   Socioeconomic History  . Marital status: Widowed    Spouse name: Not on file  . Number of children: Not on file  . Years of education: Not on file  . Highest education level: Not on file  Occupational History  . Not on file  Social Needs  . Financial resource strain: Not on file  . Food insecurity:    Worry: Not on file    Inability: Not on file  . Transportation needs:    Medical: Not on file    Non-medical: Not on file  Tobacco Use  . Smoking status: Never Smoker  . Smokeless tobacco: Never Used  Substance and Sexual Activity  .  Alcohol use: No  . Drug use: No  . Sexual activity: Not on file  Lifestyle  . Physical activity:    Days per week: Not on file    Minutes per session: Not on file  . Stress: Not on file  Relationships  . Social connections:    Talks on phone: Not on file    Gets together: Not on file    Attends religious service: Not on file    Active member of club or organization: Not on file    Attends meetings of clubs or organizations: Not on file    Relationship status: Not on file  . Intimate partner violence:    Fear of current or ex partner: Not on file    Emotionally abused: Not on file    Physically abused: Not on file    Forced sexual activity: Not on file  Other Topics Concern  . Not on file  Social History Narrative   Pt lives in 1 story home with her daughter, Shannon Higgins and Shannon Higgins   Has 2 adult daughters   Highest level of education: some college   Worked mainly as Web designer.    Past Surgical History:  Procedure Laterality Date  . ABDOMINAL HYSTERECTOMY    .  BACK SURGERY    . BREAST LUMPECTOMY    . CHOLECYSTECTOMY    . KNEE SURGERY    . SHOULDER SURGERY      Family History  Problem Relation Age of Onset  . Diabetes Daughter   . Hypertension Daughter   . Fibromyalgia Daughter   . GER disease Daughter   . Fibromyalgia Daughter   . Crohn's disease Daughter   . Asthma Daughter   . Hypertension Mother     No Known Allergies  Current Outpatient Medications on File Prior to Visit  Medication Sig Dispense Refill  . anastrozole (ARIMIDEX) 1 MG tablet Take 1 mg by mouth daily.    [name] aspirin 81 MG tablet Take 81 mg by mouth daily.    . blood glucose meter kit and supplies Dispense based on patient and insurance preference. Use up to 2 times daily as directed. (FOR ICD-10 E10.9, E11.9). 1 each 0  . brimonidine (ALPHAGAN) 0.2 % ophthalmic solution Place 1 drop into both eyes 3 (three) times daily. Both eyes     . cetirizine (ZYRTEC) 10 MG chewable tablet  Chew 1 tablet by mouth once daily as needed for allergies. 90 tablet 0  . donepezil (ARICEPT) 10 MG tablet Take 1 tablet daily (Patient taking differently: Take 10 mg by mouth daily. ) 90 tablet 3  . dorzolamide-timolol (COSOPT) 22.3-6.8 MG/ML ophthalmic solution Place 1 drop into both eyes 2 (two) times daily.    . DULoxetine (CYMBALTA) 60 MG capsule Take 1 capsule by mouth once daily for depression. 90 capsule 3  . fluticasone (FLONASE) 50 MCG/ACT nasal spray Place 2 sprays into both nostrils daily. (Patient taking differently: Place 2 sprays into both nostrils daily as needed for allergies or rhinitis. ) 48 g 3  . gabapentin (NEURONTIN) 800 MG tablet Take 1 capsule by mouth three times daily for neuropathy. 270 tablet 3  . latanoprost (XALATAN) 0.005 % ophthalmic solution Place 1 drop into both eyes at bedtime.    [name] levothyroxine (SYNTHROID, LEVOTHROID) 125 MCG tablet Take 1 tablet (125 mcg total) by mouth daily before breakfast. Take 1 tablet by mouth every morning on an empty stomach with a full glass of water. No food or other medications for 30 minutes. 30 tablet 0  . meclizine (ANTIVERT) 25 MG tablet Take 1 tablet (25 mg total) by mouth 2 (two) times daily as needed for dizziness. 180 tablet 0  . metFORMIN (GLUCOPHAGE) 500 MG tablet Take 1 tablet by mouth twice daily with food for diabetes. 180 tablet 3  . omeprazole (PRILOSEC) 20 MG capsule Take 1 capsule by mouth twice daily for heartburn. 180 capsule 3  . propranolol (INDERAL) 80 MG tablet Take 1 tablet by mouth once daily for blood pressure. 90 tablet 3  . rosuvastatin (CRESTOR) 10 MG tablet Take 1 tablet by mouth once daily for cholesterol. 90 tablet 3  . SUMAtriptan (IMITREX) 25 MG tablet Take as needed for migraine. Do not take more than twice a week (Patient taking differently: Take 25 mg by mouth daily as needed for migraine. Do not take more than twice a week) 24 tablet 3  . traZODone (DESYREL) 100 MG tablet Take 100-200 mg by mouth  at bedtime as needed for sleep.      No current facility-administered medications on file prior to visit.     There were no vitals taken for this visit.   Objective:   Physical Exam  Constitutional: She is oriented to person, place, and time. She  appears well-nourished.  Neck: Neck supple.  Cardiovascular: Normal rate and regular rhythm.  Respiratory: Effort normal and breath sounds normal.  GI: Normal appearance. There is no abdominal tenderness. There is no CVA tenderness.  Neurological: She is alert and oriented to person, place, and time.  Skin: Skin is warm and dry.           Assessment & Plan:  Urinary Frequency:  Also with pelvic pressure. Exam today unremarkable. Alert and oriented x 3. UA today with negative leuks, nitrites, blood. Trace protein. Culture sent given history of recurrent UTI's.  Discussed to monitor glucose readings, notify if she sees readings at or above 200. We will follow up with diabetes next month as scheduled.  Pleas Koch, NP

## 2018-06-03 ENCOUNTER — Other Ambulatory Visit: Payer: Self-pay | Admitting: Primary Care

## 2018-06-03 DIAGNOSIS — N3000 Acute cystitis without hematuria: Secondary | ICD-10-CM

## 2018-06-03 LAB — URINE CULTURE
MICRO NUMBER:: 134498
SPECIMEN QUALITY:: ADEQUATE

## 2018-06-03 MED ORDER — SULFAMETHOXAZOLE-TRIMETHOPRIM 800-160 MG PO TABS: 1.0000 | ORAL_TABLET | Freq: Two times a day (BID) | ORAL | 0 refills | Status: DC

## 2018-06-04 ENCOUNTER — Telehealth: Payer: Self-pay | Admitting: Primary Care

## 2018-06-04 DIAGNOSIS — S8251XD Displaced fracture of medial malleolus of right tibia, subsequent encounter for closed fracture with routine healing: Secondary | ICD-10-CM | POA: Diagnosis not present

## 2018-06-04 DIAGNOSIS — E119 Type 2 diabetes mellitus without complications: Secondary | ICD-10-CM | POA: Diagnosis not present

## 2018-06-04 DIAGNOSIS — I951 Orthostatic hypotension: Secondary | ICD-10-CM | POA: Diagnosis not present

## 2018-06-04 DIAGNOSIS — I10 Essential (primary) hypertension: Secondary | ICD-10-CM | POA: Diagnosis not present

## 2018-06-04 DIAGNOSIS — Z7984 Long term (current) use of oral hypoglycemic drugs: Secondary | ICD-10-CM | POA: Diagnosis not present

## 2018-06-04 DIAGNOSIS — Z7982 Long term (current) use of aspirin: Secondary | ICD-10-CM | POA: Diagnosis not present

## 2018-06-04 DIAGNOSIS — Z87440 Personal history of urinary (tract) infections: Secondary | ICD-10-CM | POA: Diagnosis not present

## 2018-06-04 DIAGNOSIS — R296 Repeated falls: Secondary | ICD-10-CM | POA: Diagnosis not present

## 2018-06-04 DIAGNOSIS — Z9181 History of falling: Secondary | ICD-10-CM | POA: Diagnosis not present

## 2018-06-04 NOTE — Telephone Encounter (Signed)
Approved.  

## 2018-06-04 NOTE — Telephone Encounter (Signed)
Jeanette Caprice called to get verbal order for PT  approval for 2 week 2 and 1 week 2. Can leave detailed message at [phone]

## 2018-06-05 NOTE — Telephone Encounter (Signed)
Gave the approval for the verbal orders 

## 2018-06-07 DIAGNOSIS — Z7984 Long term (current) use of oral hypoglycemic drugs: Secondary | ICD-10-CM | POA: Diagnosis not present

## 2018-06-07 DIAGNOSIS — E119 Type 2 diabetes mellitus without complications: Secondary | ICD-10-CM | POA: Diagnosis not present

## 2018-06-07 DIAGNOSIS — Z7982 Long term (current) use of aspirin: Secondary | ICD-10-CM | POA: Diagnosis not present

## 2018-06-07 DIAGNOSIS — I10 Essential (primary) hypertension: Secondary | ICD-10-CM | POA: Diagnosis not present

## 2018-06-07 DIAGNOSIS — R296 Repeated falls: Secondary | ICD-10-CM | POA: Diagnosis not present

## 2018-06-07 DIAGNOSIS — Z9181 History of falling: Secondary | ICD-10-CM | POA: Diagnosis not present

## 2018-06-07 DIAGNOSIS — Z87440 Personal history of urinary (tract) infections: Secondary | ICD-10-CM | POA: Diagnosis not present

## 2018-06-07 DIAGNOSIS — I951 Orthostatic hypotension: Secondary | ICD-10-CM | POA: Diagnosis not present

## 2018-06-07 DIAGNOSIS — S8251XD Displaced fracture of medial malleolus of right tibia, subsequent encounter for closed fracture with routine healing: Secondary | ICD-10-CM | POA: Diagnosis not present

## 2018-06-12 DIAGNOSIS — Z9181 History of falling: Secondary | ICD-10-CM | POA: Diagnosis not present

## 2018-06-12 DIAGNOSIS — R296 Repeated falls: Secondary | ICD-10-CM | POA: Diagnosis not present

## 2018-06-12 DIAGNOSIS — I10 Essential (primary) hypertension: Secondary | ICD-10-CM | POA: Diagnosis not present

## 2018-06-12 DIAGNOSIS — S8251XD Displaced fracture of medial malleolus of right tibia, subsequent encounter for closed fracture with routine healing: Secondary | ICD-10-CM | POA: Diagnosis not present

## 2018-06-12 DIAGNOSIS — Z87440 Personal history of urinary (tract) infections: Secondary | ICD-10-CM | POA: Diagnosis not present

## 2018-06-12 DIAGNOSIS — Z7982 Long term (current) use of aspirin: Secondary | ICD-10-CM | POA: Diagnosis not present

## 2018-06-12 DIAGNOSIS — Z7984 Long term (current) use of oral hypoglycemic drugs: Secondary | ICD-10-CM | POA: Diagnosis not present

## 2018-06-12 DIAGNOSIS — E119 Type 2 diabetes mellitus without complications: Secondary | ICD-10-CM | POA: Diagnosis not present

## 2018-06-12 DIAGNOSIS — I951 Orthostatic hypotension: Secondary | ICD-10-CM | POA: Diagnosis not present

## 2018-06-13 ENCOUNTER — Other Ambulatory Visit: Payer: Self-pay | Admitting: Primary Care

## 2018-06-13 DIAGNOSIS — M792 Neuralgia and neuritis, unspecified: Secondary | ICD-10-CM

## 2018-06-13 DIAGNOSIS — Z7984 Long term (current) use of oral hypoglycemic drugs: Secondary | ICD-10-CM | POA: Diagnosis not present

## 2018-06-13 DIAGNOSIS — Z9181 History of falling: Secondary | ICD-10-CM | POA: Diagnosis not present

## 2018-06-13 DIAGNOSIS — Z87440 Personal history of urinary (tract) infections: Secondary | ICD-10-CM | POA: Diagnosis not present

## 2018-06-13 DIAGNOSIS — Z7982 Long term (current) use of aspirin: Secondary | ICD-10-CM | POA: Diagnosis not present

## 2018-06-13 DIAGNOSIS — R296 Repeated falls: Secondary | ICD-10-CM | POA: Diagnosis not present

## 2018-06-13 DIAGNOSIS — S8251XD Displaced fracture of medial malleolus of right tibia, subsequent encounter for closed fracture with routine healing: Secondary | ICD-10-CM | POA: Diagnosis not present

## 2018-06-13 DIAGNOSIS — I951 Orthostatic hypotension: Secondary | ICD-10-CM | POA: Diagnosis not present

## 2018-06-13 DIAGNOSIS — I10 Essential (primary) hypertension: Secondary | ICD-10-CM | POA: Diagnosis not present

## 2018-06-13 DIAGNOSIS — E119 Type 2 diabetes mellitus without complications: Secondary | ICD-10-CM | POA: Diagnosis not present

## 2018-06-14 DIAGNOSIS — Z7984 Long term (current) use of oral hypoglycemic drugs: Secondary | ICD-10-CM | POA: Diagnosis not present

## 2018-06-14 DIAGNOSIS — Z9181 History of falling: Secondary | ICD-10-CM | POA: Diagnosis not present

## 2018-06-14 DIAGNOSIS — Z7982 Long term (current) use of aspirin: Secondary | ICD-10-CM | POA: Diagnosis not present

## 2018-06-14 DIAGNOSIS — E119 Type 2 diabetes mellitus without complications: Secondary | ICD-10-CM | POA: Diagnosis not present

## 2018-06-14 DIAGNOSIS — R296 Repeated falls: Secondary | ICD-10-CM | POA: Diagnosis not present

## 2018-06-14 DIAGNOSIS — I10 Essential (primary) hypertension: Secondary | ICD-10-CM | POA: Diagnosis not present

## 2018-06-14 DIAGNOSIS — S8251XD Displaced fracture of medial malleolus of right tibia, subsequent encounter for closed fracture with routine healing: Secondary | ICD-10-CM | POA: Diagnosis not present

## 2018-06-14 DIAGNOSIS — Z87440 Personal history of urinary (tract) infections: Secondary | ICD-10-CM | POA: Diagnosis not present

## 2018-06-14 DIAGNOSIS — I951 Orthostatic hypotension: Secondary | ICD-10-CM | POA: Diagnosis not present

## 2018-06-18 ENCOUNTER — Telehealth: Payer: Self-pay

## 2018-06-18 NOTE — Telephone Encounter (Signed)
Noted.  We will proceed with home physical therapy.

## 2018-06-18 NOTE — Telephone Encounter (Signed)
Samantha PT with [facility] said pt fell at home trying to pick up a leaf on floor on 06/13/18 due to poor strength and balance.No injury noted. FYI to Gentry Fitz NP.

## 2018-06-20 ENCOUNTER — Encounter: Payer: Self-pay | Admitting: Primary Care

## 2018-06-20 ENCOUNTER — Ambulatory Visit (INDEPENDENT_AMBULATORY_CARE_PROVIDER_SITE_OTHER): Payer: Medicare HMO | Admitting: Primary Care

## 2018-06-20 VITALS — BP 122/80 | HR 66 | Temp 98.2°F | Ht 64.0 in | Wt 161.5 lb

## 2018-06-20 DIAGNOSIS — E039 Hypothyroidism, unspecified: Secondary | ICD-10-CM

## 2018-06-20 DIAGNOSIS — E1142 Type 2 diabetes mellitus with diabetic polyneuropathy: Secondary | ICD-10-CM

## 2018-06-20 DIAGNOSIS — G4700 Insomnia, unspecified: Secondary | ICD-10-CM | POA: Diagnosis not present

## 2018-06-20 DIAGNOSIS — Z9181 History of falling: Secondary | ICD-10-CM | POA: Diagnosis not present

## 2018-06-20 DIAGNOSIS — E119 Type 2 diabetes mellitus without complications: Secondary | ICD-10-CM

## 2018-06-20 DIAGNOSIS — N390 Urinary tract infection, site not specified: Secondary | ICD-10-CM | POA: Insufficient documentation

## 2018-06-20 DIAGNOSIS — E785 Hyperlipidemia, unspecified: Secondary | ICD-10-CM

## 2018-06-20 DIAGNOSIS — Z7982 Long term (current) use of aspirin: Secondary | ICD-10-CM | POA: Diagnosis not present

## 2018-06-20 DIAGNOSIS — Z23 Encounter for immunization: Secondary | ICD-10-CM

## 2018-06-20 DIAGNOSIS — I10 Essential (primary) hypertension: Secondary | ICD-10-CM | POA: Diagnosis not present

## 2018-06-20 DIAGNOSIS — Z7984 Long term (current) use of oral hypoglycemic drugs: Secondary | ICD-10-CM | POA: Diagnosis not present

## 2018-06-20 DIAGNOSIS — I951 Orthostatic hypotension: Secondary | ICD-10-CM | POA: Diagnosis not present

## 2018-06-20 DIAGNOSIS — R296 Repeated falls: Secondary | ICD-10-CM | POA: Diagnosis not present

## 2018-06-20 DIAGNOSIS — S8251XD Displaced fracture of medial malleolus of right tibia, subsequent encounter for closed fracture with routine healing: Secondary | ICD-10-CM | POA: Diagnosis not present

## 2018-06-20 DIAGNOSIS — Z87440 Personal history of urinary (tract) infections: Secondary | ICD-10-CM | POA: Diagnosis not present

## 2018-06-20 LAB — POCT GLYCOSYLATED HEMOGLOBIN (HGB A1C): Hemoglobin A1C: 6.2 % — AB (ref 4.0–5.6)

## 2018-06-20 LAB — POC URINALSYSI DIPSTICK (AUTOMATED)
Bilirubin, UA: NEGATIVE
Glucose, UA: NEGATIVE
Ketones, UA: NEGATIVE
Nitrite, UA: NEGATIVE
PROTEIN UA: NEGATIVE
Spec Grav, UA: 1.015 (ref 1.010–1.025)
Urobilinogen, UA: 0.2 E.U./dL
pH, UA: 6 (ref 5.0–8.0)

## 2018-06-20 MED ORDER — TRAZODONE HCL 100 MG PO TABS: 100.0000 mg | ORAL_TABLET | Freq: Every evening | ORAL | 0 refills | Status: DC | PRN

## 2018-06-20 MED ORDER — TRAZODONE HCL 100 MG PO TABS: 100.0000 mg | ORAL_TABLET | Freq: Every evening | ORAL | 2 refills | Status: DC | PRN

## 2018-06-20 NOTE — Patient Instructions (Addendum)
Stop by the lab prior to leaving today. I will notify you of your results once received.   Continue Metformin 500 mg twice daily for diabetes.  Continue rosuvastatin for cholesterol.   Follow up with the Urologist as scheduled. I will be in touch regarding the urine culture results.   Please schedule a physical with me in 6 months and a Medicare Wellness Visit with our nurse. You may also schedule a lab only appointment 3-4 days prior. We will discuss your lab results in detail during your physical.  It was a pleasure to see you today!

## 2018-06-20 NOTE — Assessment & Plan Note (Signed)
Endorses taking levothyroxine appropriately today. She is taking with water only, no food or medications for 30 minutes. She is separating vitamins and PPI four hours. Repeat TSH pending today.

## 2018-06-20 NOTE — Assessment & Plan Note (Signed)
Repeat A1C today of 6.2 which is well controlled. Given her history of fluctuation we will continue Metformin 500 mg BID. Managed on statin. Urine microalbumin negative in August 2019. Pneumonia vaccination provided today. Eye exam is UTD, her daughter will update [place] with the date. Foot exam today.  Follow up in 6 months.

## 2018-06-20 NOTE — Progress Notes (Signed)
Subjective:    Patient ID: Shannon Higgins. Canter, female    DOB: Aug 10, 1934, 84 y.o.   MRN: [id number]  HPI  Ms. Heffelfinger is an 84 year old female who presents today for diabetes check. She also reports pelvic pressure and believes her prior UTI has returned.   Current medications include: Metformin 500 BID.  She is checking his/her blood glucose 0 times daily and is getting readings.  Last A1C: 7.4 in August 2019 Last Eye Exam: Completed in 2019 Last Foot Exam: Due today Pneumonia Vaccination: Completed over 5 years ago. Due today. ACE/ARB: None. Urine microalbumin negative  Statin: rosuvastatin  Diet currently consists of:  Breakfast: Oatmeal  Lunch: Skips Dinner: Sandwich, chicken, vegetables Snacks: Cottage cheese, cookies, candy, chips Desserts: Daily  Beverages: Water, diet soda  Exercise: She is not exercising.   She completed her entire course of Bactrim DS tablets earlier this month for acute cystitis.  Sensitivity report with sensitivity to Bactrim DS. She initially felt improved after antibiotics and continues to feel improved but has not noticed resolve. She has an appointment with Urology in early March. She denies frequency, hematuria, urgency, fevers.    BP Readings from Last 3 Encounters:  06/20/18 122/80  06/01/18 124/82  05/11/18 (!) 150/67    Review of Systems  Respiratory: Negative for shortness of breath.   Cardiovascular: Negative for chest pain.  Neurological: Negative for dizziness.       Chronic numbness from neuropathy        Past Medical History:  Diagnosis Date  . Asthma   . Breast cancer ([place])   . Chronic sinusitis   . Diabetes mellitus without complication ([place])   . GERD (gastroesophageal reflux disease)   . History of recurrent UTIs   . Hypertension   . Insomnia   . Migraine   . Neuropathic pain   . Pneumonia   . Rheumatoid arthritis ([place])   . Sleep apnea   . Stroke ([place])   . Thyroid disease      Social History    Socioeconomic History  . Marital status: Widowed    Spouse name: Not on file  . Number of children: Not on file  . Years of education: Not on file  . Highest education level: Not on file  Occupational History  . Not on file  Social Needs  . Financial resource strain: Not on file  . Food insecurity:    Worry: Not on file    Inability: Not on file  . Transportation needs:    Medical: Not on file    Non-medical: Not on file  Tobacco Use  . Smoking status: Never Smoker  . Smokeless tobacco: Never Used  Substance and Sexual Activity  . Alcohol use: No  . Drug use: No  . Sexual activity: Not on file  Lifestyle  . Physical activity:    Days per week: Not on file    Minutes per session: Not on file  . Stress: Not on file  Relationships  . Social connections:    Talks on phone: Not on file    Gets together: Not on file    Attends religious service: Not on file    Active member of club or organization: Not on file    Attends meetings of clubs or organizations: Not on file    Relationship status: Not on file  . Intimate partner violence:    Fear of current or ex partner: Not on file    Emotionally abused:  Not on file    Physically abused: Not on file    Forced sexual activity: Not on file  Other Topics Concern  . Not on file  Social History Narrative   Pt lives in 1 story home with her daughter, [name] and [name]'s husband   Has 2 adult daughters   Highest level of education: some college   Worked mainly as Web designer.    Past Surgical History:  Procedure Laterality Date  . ABDOMINAL HYSTERECTOMY    . BACK SURGERY    . BREAST LUMPECTOMY    . CHOLECYSTECTOMY    . KNEE SURGERY    . SHOULDER SURGERY      Family History  Problem Relation Age of Onset  . Diabetes Daughter   . Hypertension Daughter   . Fibromyalgia Daughter   . GER disease Daughter   . Fibromyalgia Daughter   . Crohn's disease Daughter   . Asthma Daughter   . Hypertension Mother      No Known Allergies  Current Outpatient Medications on File Prior to Visit  Medication Sig Dispense Refill  . anastrozole (ARIMIDEX) 1 MG tablet Take 1 mg by mouth daily.    [name] aspirin 81 MG tablet Take 81 mg by mouth daily.    . blood glucose meter kit and supplies Dispense based on patient and insurance preference. Use up to 2 times daily as directed. (FOR ICD-10 E10.9, E11.9). 1 each 0  . brimonidine (ALPHAGAN) 0.2 % ophthalmic solution Place 1 drop into both eyes 3 (three) times daily. Both eyes     . cetirizine (ZYRTEC) 10 MG chewable tablet Chew 1 tablet by mouth once daily as needed for allergies. 90 tablet 0  . donepezil (ARICEPT) 10 MG tablet Take 1 tablet daily (Patient taking differently: Take 10 mg by mouth daily. ) 90 tablet 3  . dorzolamide-timolol (COSOPT) 22.3-6.8 MG/ML ophthalmic solution Place 1 drop into both eyes 2 (two) times daily.    . DULoxetine (CYMBALTA) 60 MG capsule Take 1 capsule by mouth once daily for depression. 90 capsule 3  . fluticasone (FLONASE) 50 MCG/ACT nasal spray Place 2 sprays into both nostrils daily. (Patient taking differently: Place 2 sprays into both nostrils daily as needed for allergies or rhinitis. ) 48 g 3  . gabapentin (NEURONTIN) 800 MG tablet TAKE 1 TABLET BY MOUTH THREE TIMES A DAY 270 tablet 1  . latanoprost (XALATAN) 0.005 % ophthalmic solution Place 1 drop into both eyes at bedtime.    [name] levothyroxine (SYNTHROID, LEVOTHROID) 125 MCG tablet Take 1 tablet (125 mcg total) by mouth daily before breakfast. Take 1 tablet by mouth every morning on an empty stomach with a full glass of water. No food or other medications for 30 minutes. 30 tablet 0  . meclizine (ANTIVERT) 25 MG tablet Take 1 tablet (25 mg total) by mouth 2 (two) times daily as needed for dizziness. 180 tablet 0  . metFORMIN (GLUCOPHAGE) 500 MG tablet Take 1 tablet by mouth twice daily with food for diabetes. 180 tablet 3  . propranolol (INDERAL) 80 MG tablet Take 1 tablet by  mouth once daily for blood pressure. 90 tablet 3  . rosuvastatin (CRESTOR) 10 MG tablet Take 1 tablet by mouth once daily for cholesterol. 90 tablet 3  . SUMAtriptan (IMITREX) 25 MG tablet Take as needed for migraine. Do not take more than twice a week (Patient taking differently: Take 25 mg by mouth daily as needed for migraine. Do not take more than twice  a week) 24 tablet 3  . traZODone (DESYREL) 100 MG tablet Take 100-200 mg by mouth at bedtime as needed for sleep.      No current facility-administered medications on file prior to visit.     BP 122/80   Pulse 66   Temp 98.2 F (36.8 C) (Oral)   Ht 5' 4"  (1.626 m)   Wt 161 lb 8 oz (73.3 kg)   SpO2 97%   BMI 27.72 kg/m    Objective:   Physical Exam  Constitutional: She is oriented to person, place, and time. She appears well-nourished.  Neck: Neck supple.  Cardiovascular: Normal rate and regular rhythm.  Respiratory: Effort normal and breath sounds normal.  Neurological: She is alert and oriented to person, place, and time.  Skin: Skin is warm and dry.           Assessment & Plan:

## 2018-06-20 NOTE — Assessment & Plan Note (Signed)
Greater than 4 UTI's in 2019, this will be her second in 2020. UA today with 2+ leuks, negative nitrites, trace blood.  Culture sent. Follow-up with urology as scheduled in early March.

## 2018-06-20 NOTE — Assessment & Plan Note (Signed)
Resumed atorvastatin several months ago, repeat lipids pending.

## 2018-06-21 ENCOUNTER — Telehealth: Payer: Self-pay | Admitting: Primary Care

## 2018-06-21 LAB — URINE CULTURE
MICRO NUMBER:: 215492
RESULT: NO GROWTH
SPECIMEN QUALITY:: ADEQUATE

## 2018-06-21 LAB — LIPID PANEL
Cholesterol: 133 mg/dL (ref 0–200)
HDL: 45.3 mg/dL (ref >39.00)
NonHDL: 87.96
Total CHOL/HDL Ratio: 3
Triglycerides: 203 mg/dL — ABNORMAL HIGH (ref 0.0–149.0)
VLDL: 40.6 mg/dL — ABNORMAL HIGH (ref 0.0–40.0)

## 2018-06-21 LAB — LDL CHOLESTEROL, DIRECT: Direct LDL: 83 mg/dL

## 2018-06-21 LAB — TSH: TSH: 0.4 u[IU]/mL (ref 0.35–4.50)

## 2018-06-21 NOTE — Telephone Encounter (Signed)
Need verbal order for extension of the PT

## 2018-06-21 NOTE — Telephone Encounter (Signed)
Approved.  

## 2018-06-21 NOTE — Telephone Encounter (Signed)
Gave the approval for the verbal order.

## 2018-06-22 DIAGNOSIS — M25571 Pain in right ankle and joints of right foot: Secondary | ICD-10-CM | POA: Diagnosis not present

## 2018-06-22 DIAGNOSIS — S8251XA Displaced fracture of medial malleolus of right tibia, initial encounter for closed fracture: Secondary | ICD-10-CM | POA: Diagnosis not present

## 2018-06-27 DIAGNOSIS — E119 Type 2 diabetes mellitus without complications: Secondary | ICD-10-CM | POA: Diagnosis not present

## 2018-06-27 DIAGNOSIS — Z7982 Long term (current) use of aspirin: Secondary | ICD-10-CM | POA: Diagnosis not present

## 2018-06-27 DIAGNOSIS — R296 Repeated falls: Secondary | ICD-10-CM | POA: Diagnosis not present

## 2018-06-27 DIAGNOSIS — S8251XD Displaced fracture of medial malleolus of right tibia, subsequent encounter for closed fracture with routine healing: Secondary | ICD-10-CM | POA: Diagnosis not present

## 2018-06-27 DIAGNOSIS — Z9181 History of falling: Secondary | ICD-10-CM | POA: Diagnosis not present

## 2018-06-27 DIAGNOSIS — Z7984 Long term (current) use of oral hypoglycemic drugs: Secondary | ICD-10-CM | POA: Diagnosis not present

## 2018-06-27 DIAGNOSIS — I951 Orthostatic hypotension: Secondary | ICD-10-CM | POA: Diagnosis not present

## 2018-06-27 DIAGNOSIS — I10 Essential (primary) hypertension: Secondary | ICD-10-CM | POA: Diagnosis not present

## 2018-06-27 DIAGNOSIS — Z87440 Personal history of urinary (tract) infections: Secondary | ICD-10-CM | POA: Diagnosis not present

## 2018-07-02 ENCOUNTER — Encounter: Payer: Self-pay | Admitting: Urology

## 2018-07-02 ENCOUNTER — Telehealth: Payer: Self-pay | Admitting: Primary Care

## 2018-07-02 ENCOUNTER — Ambulatory Visit (INDEPENDENT_AMBULATORY_CARE_PROVIDER_SITE_OTHER): Payer: Medicare HMO | Admitting: Urology

## 2018-07-02 VITALS — BP 160/77 | HR 66 | Ht 64.0 in | Wt 159.0 lb

## 2018-07-02 DIAGNOSIS — I10 Essential (primary) hypertension: Secondary | ICD-10-CM | POA: Diagnosis not present

## 2018-07-02 DIAGNOSIS — R296 Repeated falls: Secondary | ICD-10-CM | POA: Diagnosis not present

## 2018-07-02 DIAGNOSIS — I951 Orthostatic hypotension: Secondary | ICD-10-CM | POA: Diagnosis not present

## 2018-07-02 DIAGNOSIS — Z9181 History of falling: Secondary | ICD-10-CM | POA: Diagnosis not present

## 2018-07-02 DIAGNOSIS — S8251XD Displaced fracture of medial malleolus of right tibia, subsequent encounter for closed fracture with routine healing: Secondary | ICD-10-CM | POA: Diagnosis not present

## 2018-07-02 DIAGNOSIS — Z7982 Long term (current) use of aspirin: Secondary | ICD-10-CM | POA: Diagnosis not present

## 2018-07-02 DIAGNOSIS — N390 Urinary tract infection, site not specified: Secondary | ICD-10-CM | POA: Diagnosis not present

## 2018-07-02 DIAGNOSIS — E119 Type 2 diabetes mellitus without complications: Secondary | ICD-10-CM | POA: Diagnosis not present

## 2018-07-02 DIAGNOSIS — Z87440 Personal history of urinary (tract) infections: Secondary | ICD-10-CM | POA: Diagnosis not present

## 2018-07-02 DIAGNOSIS — E039 Hypothyroidism, unspecified: Secondary | ICD-10-CM

## 2018-07-02 DIAGNOSIS — Z7984 Long term (current) use of oral hypoglycemic drugs: Secondary | ICD-10-CM | POA: Diagnosis not present

## 2018-07-02 LAB — URINALYSIS, COMPLETE
Bilirubin, UA: NEGATIVE
Glucose, UA: NEGATIVE
Ketones, UA: NEGATIVE
NITRITE UA: NEGATIVE
SPEC GRAV UA: 1.015 (ref 1.005–1.030)
Urobilinogen, Ur: 0.2 mg/dL (ref 0.2–1.0)
pH, UA: 7 (ref 5.0–7.5)

## 2018-07-02 LAB — MICROSCOPIC EXAMINATION
Bacteria, UA: NONE SEEN
Epithelial Cells (non renal): NONE SEEN /hpf (ref 0–10)

## 2018-07-02 NOTE — Patient Instructions (Signed)
Urinary Tract Infection, Adult A urinary tract infection (UTI) is an infection of any part of the urinary tract. The urinary tract includes:  The kidneys.  The ureters.  The bladder.  The urethra. These organs make, store, and get rid of pee (urine) in the body. What are the causes? This is caused by germs (bacteria) in your genital area. These germs grow and cause swelling (inflammation) of your urinary tract. What increases the risk? You are more likely to develop this condition if:  You have a small, thin tube (catheter) to drain pee.  You cannot control when you pee or poop (incontinence).  You are female, and: ? You use these methods to prevent pregnancy: ? A medicine that kills sperm (spermicide). ? A device that blocks sperm (diaphragm). ? You have low levels of a female hormone (estrogen). ? You are pregnant.  You have genes that add to your risk.  You are sexually active.  You take antibiotic medicines.  You have trouble peeing because of: ? A prostate that is bigger than normal, if you are female. ? A blockage in the part of your body that drains pee from the bladder (urethra). ? A kidney stone. ? A nerve condition that affects your bladder (neurogenic bladder). ? Not getting enough to drink. ? Not peeing often enough.  You have other conditions, such as: ? Diabetes. ? A weak disease-fighting system (immune system). ? Sickle cell disease. ? Gout. ? Injury of the spine. What are the signs or symptoms? Symptoms of this condition include:  Needing to pee right away (urgently).  Peeing often.  Peeing small amounts often.  Pain or burning when peeing.  Blood in the pee.  Pee that smells bad or not like normal.  Trouble peeing.  Pee that is cloudy.  Fluid coming from the vagina, if you are female.  Pain in the belly or lower back. Other symptoms include:  Throwing up (vomiting).  No urge to eat.  Feeling mixed up (confused).  Being tired  and grouchy (irritable).  A fever.  Watery poop (diarrhea). How is this treated? This condition may be treated with:  Antibiotic medicine.  Other medicines.  Drinking enough water. Follow these instructions at home:  Medicines  Take over-the-counter and prescription medicines only as told by your doctor.  If you were prescribed an antibiotic medicine, take it as told by your doctor. Do not stop taking it even if you start to feel better. General instructions  Make sure you: ? Pee until your bladder is empty. ? Do not hold pee for a long time. ? Empty your bladder after sex. ? Wipe from front to back after pooping if you are a female. Use each tissue one time when you wipe.  Drink enough fluid to keep your pee pale yellow.  Keep all follow-up visits as told by your doctor. This is important. Contact a doctor if:  You do not get better after 1-2 days.  Your symptoms go away and then come back. Get help right away if:  You have very bad back pain.  You have very bad pain in your lower belly.  You have a fever.  You are sick to your stomach (nauseous).  You are throwing up. Summary  A urinary tract infection (UTI) is an infection of any part of the urinary tract.  This condition is caused by germs in your genital area.  There are many risk factors for a UTI. These include having a small, thin   tube to drain pee and not being able to control when you pee or poop.  Treatment includes antibiotic medicines for germs.  Drink enough fluid to keep your pee pale yellow. This information is not intended to replace advice given to you by your health care provider. Make sure you discuss any questions you have with your health care provider. Document Released: 10/05/2007 Document Revised: 10/26/2017 Document Reviewed: 10/26/2017 Elsevier Interactive Patient Education  2019 Elsevier Inc.  

## 2018-07-02 NOTE — Progress Notes (Signed)
07/02/2018 3:55 PM   Shannon Higgins Mar 02, 1935 [id number]  Referring provider: Pleas Koch, NP [place], [place] [address]  CC: Recurrent UTI  HPI: I saw Shannon Higgins in urology clinic today in consultation from Loma Boston, NP for recurrent urinary tract infections.  She is a relatively healthy 84 year old female with past medical history notable for well-controlled diabetes, breast cancer, stroke, and hypertension found to have 4 culture documented urinary tract infections over the last year.  These were Klebsiella and E. coli.  She denies a history of gross hematuria.  She does have chronic back pain.  She reports she has a history of kidney stones, but cannot elaborate further.  She is here with her daughter today who is a Marine scientist.  There are no aggravating or alleviating factors.  Severity is moderate.   PMH: Past Medical History:  Diagnosis Date  . Asthma   . Breast cancer ([place])   . Chronic sinusitis   . Diabetes mellitus without complication ([place])   . GERD (gastroesophageal reflux disease)   . History of recurrent UTIs   . Hypertension   . Insomnia   . Migraine   . Neuropathic pain   . Pneumonia   . Rheumatoid arthritis ([place])   . Sleep apnea   . Stroke ([place])   . Thyroid disease     Surgical History: Past Surgical History:  Procedure Laterality Date  . ABDOMINAL HYSTERECTOMY    . BACK SURGERY    . BREAST LUMPECTOMY    . CHOLECYSTECTOMY    . KNEE SURGERY    . SHOULDER SURGERY      Allergies: No Known Allergies  Family History: Family History  Problem Relation Age of Onset  . Diabetes Daughter   . Hypertension Daughter   . Fibromyalgia Daughter   . GER disease Daughter   . Fibromyalgia Daughter   . Crohn's disease Daughter   . Asthma Daughter   . Hypertension Mother     Social History:  reports that she has never smoked. She has never used smokeless tobacco. She reports that she does not drink alcohol or use  drugs.  ROS: Please see flowsheet from today's date for complete review of systems.  Physical Exam: BP (!) 160/77 (BP Location: Left Arm, Patient Position: Sitting)   Pulse 66   Ht 5\' 4"  (1.626 m)   Wt 159 lb (72.1 kg)   BMI 27.29 kg/m    Constitutional:  Alert and oriented, No acute distress. Cardiovascular: No clubbing, cyanosis, or edema. Respiratory: Normal respiratory effort, no increased work of breathing. GI: Abdomen is soft, nontender, nondistended, no abdominal masses GU: No CVA tenderness Lymph: No cervical or inguinal lymphadenopathy. Skin: No rashes, bruises or suspicious lesions. Neurologic: Grossly intact, no focal deficits, moving all 4 extremities. Psychiatric: Normal mood and affect.  Laboratory Data: Reviewed  Pertinent Imaging: None to review  Assessment & Plan:   In summary, the patient is an 84 year old female with for urinary tract infections over the last year.  We discussed the evaluation and treatment of patients with recurrent UTIs at length.  We specifically discussed the differences between asymptomatic bacteriuria and true urinary tract infection.  We discussed the AUA definition of recurrent UTI of at least 2 culture proven symptomatic acute cystitis episodes in a 75-month period, or 3 within a 1 year period.  We discussed the importance of culture directed antibiotic treatment, and antibiotic stewardship.  First-line therapy includes nitrofurantoin(5 days), Bactrim(3 days), or fosfomycin(3  g single dose).  Possible etiologies of recurrent infection include periurethral tissue atrophy in postmenopausal woman, constipation, sexual activity, incomplete emptying, anatomic abnormalities, and even genetic predisposition.  Finally, we discussed the role of perineal hygiene, timed voiding, adequate hydration, topical vaginal estrogen, cranberry prophylaxis, and low-dose antibiotic prophylaxis.  Behavioral strategies discussed Trial of cranberry  prophylaxis KUB and ultrasound to evaluate for nephrolithiasis RTC 3 months for symptom check If ongoing UTIs, consider Bactrim daily prophylaxis  Billey Co, MD  [facility] [address] [place], [place] [address] [phone]

## 2018-07-02 NOTE — Telephone Encounter (Signed)
Shannon Higgins saw patient today.  Patient's breathing is labored with exertion and her oxygen was lower than normal 93%. Patient said she felt swollen in her upper abdominal area.  Patient had a temperature of 99.4.  Patient has diarrhea.  Shannon Higgins told patient to call office to schedule appointment.

## 2018-07-02 NOTE — Telephone Encounter (Signed)
FYI

## 2018-07-02 NOTE — Telephone Encounter (Signed)
Noted and agree that she will need an appointment, please schedule.

## 2018-07-03 NOTE — Telephone Encounter (Signed)
Patient states she cannot make an appointment without having her daughter with her. She will have her daughter call to schedule an appointment. Pt is also requesting a call to discuss thyroid medication. She states she has a question about it.

## 2018-07-04 MED ORDER — LEVOTHYROXINE SODIUM 125 MCG PO TABS: 125.0000 ug | ORAL_TABLET | Freq: Every day | ORAL | 0 refills | Status: DC

## 2018-07-04 MED ORDER — LEVOTHYROXINE SODIUM 125 MCG PO TABS: 125.0000 ug | ORAL_TABLET | Freq: Every day | ORAL | 2 refills | Status: DC

## 2018-07-04 NOTE — Telephone Encounter (Signed)
Message left for patient to return my call.  

## 2018-07-04 NOTE — Telephone Encounter (Signed)
Pt daughter returned your call. She went to urologist Mon, feeling better but still wants to discuss the medication. Regarding Levothyroxine medication pt 112 but needs 125. It was changed in hospital- needs 125 sent to [facility]. Please call with any questions.

## 2018-07-04 NOTE — Telephone Encounter (Signed)
Spoken to patient's daughter. Yes, patient was taking 125 mcg in February when the lab was done.  [facility] [place] and [facility] would like 90 days supply to [facility].

## 2018-07-04 NOTE — Telephone Encounter (Signed)
Noted, refills sent to both pharmacies.

## 2018-07-04 NOTE — Telephone Encounter (Signed)
Has she been taking levothyroxine 125 mcg since I saw her in February 2020? Thyroid function was normal in February 2020, what dose was she taking at that time?

## 2018-07-05 DIAGNOSIS — S8251XD Displaced fracture of medial malleolus of right tibia, subsequent encounter for closed fracture with routine healing: Secondary | ICD-10-CM | POA: Diagnosis not present

## 2018-07-05 DIAGNOSIS — Z7984 Long term (current) use of oral hypoglycemic drugs: Secondary | ICD-10-CM | POA: Diagnosis not present

## 2018-07-05 DIAGNOSIS — R296 Repeated falls: Secondary | ICD-10-CM | POA: Diagnosis not present

## 2018-07-05 DIAGNOSIS — Z7982 Long term (current) use of aspirin: Secondary | ICD-10-CM | POA: Diagnosis not present

## 2018-07-05 DIAGNOSIS — Z87440 Personal history of urinary (tract) infections: Secondary | ICD-10-CM | POA: Diagnosis not present

## 2018-07-05 DIAGNOSIS — I10 Essential (primary) hypertension: Secondary | ICD-10-CM | POA: Diagnosis not present

## 2018-07-05 DIAGNOSIS — I951 Orthostatic hypotension: Secondary | ICD-10-CM | POA: Diagnosis not present

## 2018-07-05 DIAGNOSIS — Z9181 History of falling: Secondary | ICD-10-CM | POA: Diagnosis not present

## 2018-07-05 DIAGNOSIS — E119 Type 2 diabetes mellitus without complications: Secondary | ICD-10-CM | POA: Diagnosis not present

## 2018-07-06 ENCOUNTER — Ambulatory Visit (INDEPENDENT_AMBULATORY_CARE_PROVIDER_SITE_OTHER): Payer: Medicare HMO | Admitting: Neurology

## 2018-07-06 ENCOUNTER — Ambulatory Visit (INDEPENDENT_AMBULATORY_CARE_PROVIDER_SITE_OTHER): Payer: Medicare HMO | Admitting: Family Medicine

## 2018-07-06 ENCOUNTER — Encounter: Payer: Self-pay | Admitting: Family Medicine

## 2018-07-06 DIAGNOSIS — G43709 Chronic migraine without aura, not intractable, without status migrainosus: Secondary | ICD-10-CM

## 2018-07-06 DIAGNOSIS — J0110 Acute frontal sinusitis, unspecified: Secondary | ICD-10-CM | POA: Diagnosis not present

## 2018-07-06 MED ORDER — AMOXICILLIN-POT CLAVULANATE 875-125 MG PO TABS: 1.0000 | ORAL_TABLET | Freq: Two times a day (BID) | ORAL | 0 refills | Status: DC

## 2018-07-06 MED ORDER — ONABOTULINUMTOXINA 100 UNITS IJ SOLR
165.0000 [IU] | Freq: Once | INTRAMUSCULAR | Status: AC
  Administered 2018-07-06: 165 [IU] via INTRAMUSCULAR

## 2018-07-06 NOTE — Patient Instructions (Addendum)
Check your BP at home.  If you continue to have BP <130/<80, then cut the propranolol back to 40mg  a day (1/2 tab).   Start augmentin, rest and fluids in the meantime.  Take care.  Glad to see you.

## 2018-07-06 NOTE — Progress Notes (Signed)
[facility]   Procedure Note Botox  Attending: Dr. Tomi Likens  Preoperative Diagnosis(es): Chronic migraine  Consent obtained from: The patient Benefits discussed included, but were not limited to decreased muscle tightness, increased joint range of motion, and decreased pain.  Risk discussed included, but were not limited pain and discomfort, bleeding, bruising, excessive weakness, venous thrombosis, muscle atrophy and dysphagia.  Anticipated outcomes of the procedure as well as he risks and benefits of the alternatives to the procedure, and the roles and tasks of the personnel to be involved, were discussed with the patient, and the patient consents to the procedure and agrees to proceed. A copy of the patient medication guide was given to the patient which explains the blackbox warning.  Patients identity and treatment sites confirmed Yes.  .  Details of Procedure: Skin was cleaned with alcohol. Prior to injection, the needle plunger was aspirated to make sure the needle was not within a blood vessel.  There was no blood retrieved on aspiration.    Following is a summary of the muscles injected  And the amount of Botulinum toxin used:  Dilution 200 units of Botox was reconstituted with 4 ml of preservative free normal saline. Time of reconstitution: At the time of the office visit (<30 minutes prior to injection)   Injections  155 total units of Botox was injected with a 30 gauge needle.  Injection Sites: L occipitalis: 15 units- 3 sites  R occiptalis: 15 units- 3 sites  L upper trapezius: 15 units- 3 sites R upper trapezius: 15 units- 3 sits          L paraspinal: 10 units- 2 sites R paraspinal: 10 units- 2 sites  Face L frontalis(2 injection sites):10 units   R frontalis(2 injection sites):10 units         L corrugator: 5 units   R corrugator: 5 units           Procerus: 5 units   L temporalis: 20 units R temporalis: 20 units   Agent:  200 units of botulinum Type A  (Onobotulinum Toxin type A) was reconstituted with 4 ml of preservative free normal saline.  Time of reconstitution: At the time of the office visit (<30 minutes prior to injection)     Total injected (Units): 155  Total wasted (Units): 10  Patient tolerated procedure well without complications.   Reinjection is anticipated in 3 months.

## 2018-07-06 NOTE — Progress Notes (Signed)
Sick for about 3 weeks.  Cough with green sputum.  General malaise, fatigue.  Worse in the last few days.   Fever: no, no temps >100 Aches: some leg aches but not diffuse aches.   SOB: prev more at night but not now, resolved.   Facial pain: no No vomiting, but had some loose stools.   No ST.    Lower BP noted.  Mildly episodically lightheaded.  Cautions d/w pt.    Meds, vitals, and allergies reviewed.   ROS: Per HPI unless specifically indicated in ROS section   GEN: nad, alert and oriented HEENT: mucous membranes moist, tm w/o erythema, nasal exam w/o erythema, clear discharge noted,  OP with cobblestoning NECK: supple w/o LA CV: rrr.   PULM: ctab, no inc wob EXT: no edema SKIN: well perfused.  R frontal sinus ttp.

## 2018-07-08 DIAGNOSIS — J0110 Acute frontal sinusitis, unspecified: Secondary | ICD-10-CM | POA: Insufficient documentation

## 2018-07-08 NOTE — Assessment & Plan Note (Signed)
She will check her BP at home.  If she continues to have BP <130/<80, then she will cut the propranolol back to 40mg  a day (1/2 tab).   Start augmentin, rest and fluids in the meantime.  Nontoxic.  Supportive care.  Okay for outpatient follow-up.  She agrees.

## 2018-07-11 DIAGNOSIS — Z87440 Personal history of urinary (tract) infections: Secondary | ICD-10-CM | POA: Diagnosis not present

## 2018-07-11 DIAGNOSIS — Z7982 Long term (current) use of aspirin: Secondary | ICD-10-CM | POA: Diagnosis not present

## 2018-07-11 DIAGNOSIS — Z9181 History of falling: Secondary | ICD-10-CM | POA: Diagnosis not present

## 2018-07-11 DIAGNOSIS — I951 Orthostatic hypotension: Secondary | ICD-10-CM | POA: Diagnosis not present

## 2018-07-11 DIAGNOSIS — Z7984 Long term (current) use of oral hypoglycemic drugs: Secondary | ICD-10-CM | POA: Diagnosis not present

## 2018-07-11 DIAGNOSIS — S8251XD Displaced fracture of medial malleolus of right tibia, subsequent encounter for closed fracture with routine healing: Secondary | ICD-10-CM | POA: Diagnosis not present

## 2018-07-11 DIAGNOSIS — E119 Type 2 diabetes mellitus without complications: Secondary | ICD-10-CM | POA: Diagnosis not present

## 2018-07-11 DIAGNOSIS — R296 Repeated falls: Secondary | ICD-10-CM | POA: Diagnosis not present

## 2018-07-11 DIAGNOSIS — I10 Essential (primary) hypertension: Secondary | ICD-10-CM | POA: Diagnosis not present

## 2018-07-13 ENCOUNTER — Other Ambulatory Visit: Payer: Self-pay

## 2018-07-13 ENCOUNTER — Ambulatory Visit
Admission: RE | Admit: 2018-07-13 | Discharge: 2018-07-13 | Disposition: A | Discharge status: Home / Self Care | Payer: Medicare HMO | Source: Ambulatory Visit | Attending: Urology | Admitting: Urology

## 2018-07-13 DIAGNOSIS — N390 Urinary tract infection, site not specified: Secondary | ICD-10-CM | POA: Diagnosis not present

## 2018-07-16 DIAGNOSIS — E119 Type 2 diabetes mellitus without complications: Secondary | ICD-10-CM | POA: Diagnosis not present

## 2018-07-16 DIAGNOSIS — Z9181 History of falling: Secondary | ICD-10-CM | POA: Diagnosis not present

## 2018-07-16 DIAGNOSIS — Z87440 Personal history of urinary (tract) infections: Secondary | ICD-10-CM | POA: Diagnosis not present

## 2018-07-16 DIAGNOSIS — I951 Orthostatic hypotension: Secondary | ICD-10-CM | POA: Diagnosis not present

## 2018-07-16 DIAGNOSIS — Z7982 Long term (current) use of aspirin: Secondary | ICD-10-CM | POA: Diagnosis not present

## 2018-07-16 DIAGNOSIS — R296 Repeated falls: Secondary | ICD-10-CM | POA: Diagnosis not present

## 2018-07-16 DIAGNOSIS — S8251XD Displaced fracture of medial malleolus of right tibia, subsequent encounter for closed fracture with routine healing: Secondary | ICD-10-CM | POA: Diagnosis not present

## 2018-07-16 DIAGNOSIS — I10 Essential (primary) hypertension: Secondary | ICD-10-CM | POA: Diagnosis not present

## 2018-07-16 DIAGNOSIS — Z7984 Long term (current) use of oral hypoglycemic drugs: Secondary | ICD-10-CM | POA: Diagnosis not present

## 2018-07-17 ENCOUNTER — Telehealth: Payer: Self-pay

## 2018-07-17 NOTE — Telephone Encounter (Signed)
-----   Message from Billey Co, MD sent at 07/17/2018  1:08 PM EDT ----- Please let her know no stones or hydronephrosis was seen on her KUB or U/S. Keep scheduled follow up in June  Brian Sninsky, MD 07/17/2018

## 2018-07-17 NOTE — Telephone Encounter (Signed)
.  mychart

## 2018-07-18 ENCOUNTER — Other Ambulatory Visit: Payer: Self-pay | Admitting: Primary Care

## 2018-07-18 DIAGNOSIS — G4700 Insomnia, unspecified: Secondary | ICD-10-CM

## 2018-08-16 ENCOUNTER — Other Ambulatory Visit: Payer: Self-pay | Admitting: Primary Care

## 2018-08-16 DIAGNOSIS — R42 Dizziness and giddiness: Secondary | ICD-10-CM

## 2018-08-21 ENCOUNTER — Emergency Department: Payer: Medicare HMO

## 2018-08-21 ENCOUNTER — Inpatient Hospital Stay
Admission: EM | Admit: 2018-08-21 | Discharge: 2018-08-22 | DRG: 871 | Disposition: A | Discharge status: Home / Self Care | Payer: Medicare HMO | Attending: Specialist | Admitting: Specialist

## 2018-08-21 ENCOUNTER — Other Ambulatory Visit: Payer: Self-pay

## 2018-08-21 DIAGNOSIS — F0390 Unspecified dementia without behavioral disturbance: Secondary | ICD-10-CM | POA: Diagnosis present

## 2018-08-21 DIAGNOSIS — R410 Disorientation, unspecified: Secondary | ICD-10-CM | POA: Diagnosis not present

## 2018-08-21 DIAGNOSIS — Z853 Personal history of malignant neoplasm of breast: Secondary | ICD-10-CM

## 2018-08-21 DIAGNOSIS — Z833 Family history of diabetes mellitus: Secondary | ICD-10-CM

## 2018-08-21 DIAGNOSIS — Z7982 Long term (current) use of aspirin: Secondary | ICD-10-CM | POA: Diagnosis not present

## 2018-08-21 DIAGNOSIS — G4700 Insomnia, unspecified: Secondary | ICD-10-CM | POA: Diagnosis present

## 2018-08-21 DIAGNOSIS — E039 Hypothyroidism, unspecified: Secondary | ICD-10-CM | POA: Diagnosis present

## 2018-08-21 DIAGNOSIS — J329 Chronic sinusitis, unspecified: Secondary | ICD-10-CM | POA: Diagnosis present

## 2018-08-21 DIAGNOSIS — M069 Rheumatoid arthritis, unspecified: Secondary | ICD-10-CM | POA: Diagnosis present

## 2018-08-21 DIAGNOSIS — J189 Pneumonia, unspecified organism: Secondary | ICD-10-CM | POA: Diagnosis not present

## 2018-08-21 DIAGNOSIS — Z8673 Personal history of transient ischemic attack (TIA), and cerebral infarction without residual deficits: Secondary | ICD-10-CM | POA: Diagnosis not present

## 2018-08-21 DIAGNOSIS — Z7984 Long term (current) use of oral hypoglycemic drugs: Secondary | ICD-10-CM | POA: Diagnosis not present

## 2018-08-21 DIAGNOSIS — Z79890 Hormone replacement therapy (postmenopausal): Secondary | ICD-10-CM

## 2018-08-21 DIAGNOSIS — A419 Sepsis, unspecified organism: Secondary | ICD-10-CM | POA: Diagnosis not present

## 2018-08-21 DIAGNOSIS — H409 Unspecified glaucoma: Secondary | ICD-10-CM | POA: Diagnosis present

## 2018-08-21 DIAGNOSIS — F329 Major depressive disorder, single episode, unspecified: Secondary | ICD-10-CM | POA: Diagnosis present

## 2018-08-21 DIAGNOSIS — Z85820 Personal history of malignant melanoma of skin: Secondary | ICD-10-CM | POA: Diagnosis not present

## 2018-08-21 DIAGNOSIS — J9601 Acute respiratory failure with hypoxia: Secondary | ICD-10-CM | POA: Diagnosis not present

## 2018-08-21 DIAGNOSIS — E119 Type 2 diabetes mellitus without complications: Secondary | ICD-10-CM | POA: Diagnosis not present

## 2018-08-21 DIAGNOSIS — I10 Essential (primary) hypertension: Secondary | ICD-10-CM | POA: Diagnosis present

## 2018-08-21 DIAGNOSIS — G4733 Obstructive sleep apnea (adult) (pediatric): Secondary | ICD-10-CM | POA: Diagnosis present

## 2018-08-21 DIAGNOSIS — E1142 Type 2 diabetes mellitus with diabetic polyneuropathy: Secondary | ICD-10-CM | POA: Diagnosis not present

## 2018-08-21 DIAGNOSIS — Z20828 Contact with and (suspected) exposure to other viral communicable diseases: Secondary | ICD-10-CM | POA: Diagnosis present

## 2018-08-21 DIAGNOSIS — Z825 Family history of asthma and other chronic lower respiratory diseases: Secondary | ICD-10-CM

## 2018-08-21 DIAGNOSIS — Z8249 Family history of ischemic heart disease and other diseases of the circulatory system: Secondary | ICD-10-CM

## 2018-08-21 DIAGNOSIS — Z79899 Other long term (current) drug therapy: Secondary | ICD-10-CM

## 2018-08-21 DIAGNOSIS — G43909 Migraine, unspecified, not intractable, without status migrainosus: Secondary | ICD-10-CM | POA: Diagnosis present

## 2018-08-21 DIAGNOSIS — I959 Hypotension, unspecified: Secondary | ICD-10-CM | POA: Diagnosis not present

## 2018-08-21 DIAGNOSIS — R0902 Hypoxemia: Secondary | ICD-10-CM | POA: Diagnosis not present

## 2018-08-21 DIAGNOSIS — R6520 Severe sepsis without septic shock: Secondary | ICD-10-CM | POA: Diagnosis not present

## 2018-08-21 DIAGNOSIS — E785 Hyperlipidemia, unspecified: Secondary | ICD-10-CM | POA: Diagnosis present

## 2018-08-21 DIAGNOSIS — R509 Fever, unspecified: Secondary | ICD-10-CM | POA: Diagnosis not present

## 2018-08-21 DIAGNOSIS — R0602 Shortness of breath: Secondary | ICD-10-CM | POA: Diagnosis not present

## 2018-08-21 DIAGNOSIS — K219 Gastro-esophageal reflux disease without esophagitis: Secondary | ICD-10-CM | POA: Diagnosis present

## 2018-08-21 DIAGNOSIS — R05 Cough: Secondary | ICD-10-CM | POA: Diagnosis not present

## 2018-08-21 LAB — COMPREHENSIVE METABOLIC PANEL
ALT: 12 U/L (ref 0–44)
AST: 23 U/L (ref 15–41)
Albumin: 4.4 g/dL (ref 3.5–5.0)
Alkaline Phosphatase: 100 U/L (ref 38–126)
Anion gap: 16 — ABNORMAL HIGH (ref 5–15)
BUN: 12 mg/dL (ref 8–23)
CO2: 24 mmol/L (ref 22–32)
Calcium: 9.3 mg/dL (ref 8.9–10.3)
Chloride: 96 mmol/L — ABNORMAL LOW (ref 98–111)
Creatinine, Ser: 0.69 mg/dL (ref 0.44–1.00)
GFR calc Af Amer: >60 mL/min (ref >60)
GFR calc non Af Amer: >60 mL/min (ref >60)
Glucose, Bld: 153 mg/dL — ABNORMAL HIGH (ref 70–99)
Potassium: 3.6 mmol/L (ref 3.5–5.1)
Sodium: 136 mmol/L (ref 135–145)
Total Bilirubin: 0.5 mg/dL (ref 0.3–1.2)
Total Protein: 8 g/dL (ref 6.5–8.1)

## 2018-08-21 LAB — CBC WITH DIFFERENTIAL/PLATELET
Abs Immature Granulocytes: 0.1 K/uL — ABNORMAL HIGH (ref 0.00–0.07)
Basophils Absolute: 0.1 K/uL (ref 0.0–0.1)
Basophils Relative: 0 %
Eosinophils Absolute: 0.2 K/uL (ref 0.0–0.5)
Eosinophils Relative: 1 %
HCT: 39.9 % (ref 36.0–46.0)
Hemoglobin: 12.9 g/dL (ref 12.0–15.0)
Immature Granulocytes: 1 %
Lymphocytes Relative: 10 %
Lymphs Abs: 2.1 K/uL (ref 0.7–4.0)
MCH: 27.7 pg (ref 26.0–34.0)
MCHC: 32.3 g/dL (ref 30.0–36.0)
MCV: 85.6 fL (ref 80.0–100.0)
Monocytes Absolute: 1.4 K/uL — ABNORMAL HIGH (ref 0.1–1.0)
Monocytes Relative: 7 %
Neutro Abs: 17.2 K/uL — ABNORMAL HIGH (ref 1.7–7.7)
Neutrophils Relative %: 81 %
Platelets: 291 K/uL (ref 150–400)
RBC: 4.66 MIL/uL (ref 3.87–5.11)
RDW: 13.2 % (ref 11.5–15.5)
WBC: 21.1 K/uL — ABNORMAL HIGH (ref 4.0–10.5)
nRBC: 0 % (ref 0.0–0.2)

## 2018-08-21 LAB — URINALYSIS, COMPLETE (UACMP) WITH MICROSCOPIC
Bacteria, UA: NONE SEEN
Bilirubin Urine: NEGATIVE
Glucose, UA: NEGATIVE mg/dL
Hgb urine dipstick: NEGATIVE
Ketones, ur: NEGATIVE mg/dL
Leukocytes,Ua: NEGATIVE
Nitrite: NEGATIVE
Protein, ur: 30 mg/dL — AB
Specific Gravity, Urine: 1.016 (ref 1.005–1.030)
pH: 8 (ref 5.0–8.0)

## 2018-08-21 LAB — LACTIC ACID, PLASMA
Lactic Acid, Venous: 2.5 mmol/L (ref 0.5–1.9)
Lactic Acid, Venous: 1.3 mmol/L (ref 0.5–1.9)

## 2018-08-21 LAB — SARS CORONAVIRUS 2 BY RT PCR (HOSPITAL ORDER, PERFORMED IN ~~LOC~~ HOSPITAL LAB): SARS Coronavirus 2: NEGATIVE

## 2018-08-21 LAB — ABO/RH: ABO/RH(D): O POS

## 2018-08-21 LAB — TROPONIN I: Troponin I: <0.03 ng/mL (ref <0.03)

## 2018-08-21 MED ORDER — IOHEXOL 350 MG/ML SOLN
75.0000 mL | Freq: Once | INTRAVENOUS | Status: AC | PRN
  Administered 2018-08-21: 75 mL via INTRAVENOUS

## 2018-08-21 MED ORDER — SODIUM CHLORIDE 0.9 % IV SOLN
500.0000 mg | Freq: Once | INTRAVENOUS | Status: AC
  Administered 2018-08-21: 20:00:00 500 mg via INTRAVENOUS
  Filled 2018-08-21: qty 500

## 2018-08-21 MED ORDER — SODIUM CHLORIDE 0.9 % IV SOLN
1.0000 g | Freq: Once | INTRAVENOUS | Status: AC
  Administered 2018-08-21: 1 g via INTRAVENOUS
  Filled 2018-08-21: qty 10

## 2018-08-21 MED ORDER — SODIUM CHLORIDE 0.9 % IV BOLUS
1000.0000 mL | Freq: Once | INTRAVENOUS | Status: AC
  Administered 2018-08-21: 1000 mL via INTRAVENOUS

## 2018-08-21 MED ORDER — ONDANSETRON HCL 4 MG/2ML IJ SOLN
4.0000 mg | Freq: Once | INTRAMUSCULAR | Status: AC
  Administered 2018-08-21: 21:00:00 4 mg via INTRAVENOUS
  Filled 2018-08-21: qty 2

## 2018-08-21 NOTE — ED Notes (Signed)
ED TO INPATIENT HANDOFF REPORT  ED Nurse Name and Phone #: Advika Mclelland  S Name/Age/Gender Shannon Higgins 84 y.o. female Room/Bed: ED02A/ED02A  Code Status   Code Status: Prior  Home/SNF/Other Home Patient oriented to: situation Is this baseline? Yes   Triage Complete: Triage complete  Chief Complaint Weakness  Triage Note Pt arrives to ED from home via [facility] with c/c of confusion, generalized weakness. Pt last seen normal at 0800 this morning by family. Pt awoke at 1600 with weakness/confusion. EMS reports seeing L side lean when standing. Transport vitals reported as 142/76, p80, O2 sat 94% on room air. Temp 100.5. Pt given 1000mg  tylenol enroute. Upon arrival, pt A&Ox4, NAD.    Allergies No Known Allergies  Level of Care/Admitting Diagnosis ED Disposition    ED Disposition Condition [facility] Area: [place] [[id number]]  Level of Care: Med-Surg [16]  Covid Evaluation: N/A  Diagnosis: Sepsis [facility]) [[id number]]  Admitting Physician: Lance Coon [[id number]]  Attending Physician: Lance Coon [phone]  Estimated length of stay: past midnight tomorrow  Certification:: I certify this patient will need inpatient services for at least 2 midnights  PT Class (Do Not Modify): Inpatient [101]  PT Acc Code (Do Not Modify): Private [1]       B Medical/Surgery History Past Medical History:  Diagnosis Date  . Asthma   . Breast cancer ([place])   . Chronic sinusitis   . Diabetes mellitus without complication ([place])   . GERD (gastroesophageal reflux disease)   . History of recurrent UTIs   . Hypertension   . Insomnia   . Migraine   . Neuropathic pain   . Pneumonia   . Rheumatoid arthritis ([place])   . Sleep apnea   . Stroke ([place])   . Thyroid disease    Past Surgical History:  Procedure Laterality Date  . ABDOMINAL HYSTERECTOMY    . BACK SURGERY    . BREAST LUMPECTOMY    . CHOLECYSTECTOMY    . KNEE SURGERY    . SHOULDER SURGERY        A IV Location/Drains/Wounds Patient Lines/Drains/Airways Status   Active Line/Drains/Airways    Name:   Placement date:   Placement time:   Site:   Days:   Peripheral IV 08/21/18 Left Hand   08/21/18    -    Hand   less than 1   Peripheral IV 08/21/18 Right Forearm   08/21/18    2006    Forearm   less than 1   Peripheral IV 08/21/18 Left Forearm   08/21/18    2007    Forearm   less than 1   External Urinary Catheter   05/09/18    1900    -   104          Intake/Output Last 24 hours  Intake/Output Summary (Last 24 hours) at 08/21/2018 2347 Last data filed at 08/21/2018 2202 Gross per 24 hour  Intake 1208.89 ml  Output -  Net 1208.89 ml    Labs/Imaging Results for orders placed or performed during the hospital encounter of 08/21/18 (from the past 48 hour(s))  CBC with Differential/Platelet     Status: Abnormal   Collection Time: 08/21/18  6:36 PM  Result Value Ref Range   WBC 21.1 (H) 4.0 - 10.5 K/uL   RBC 4.66 3.87 - 5.11 MIL/uL   Hemoglobin 12.9 12.0 - 15.0 g/dL   HCT 39.9 36.0 - 46.0 %   MCV  85.6 80.0 - 100.0 fL   MCH 27.7 26.0 - 34.0 pg   MCHC 32.3 30.0 - 36.0 g/dL   RDW 13.2 11.5 - 15.5 %   Platelets 291 150 - 400 K/uL   nRBC 0.0 0.0 - 0.2 %   Neutrophils Relative % 81 %   Neutro Abs 17.2 (H) 1.7 - 7.7 K/uL   Lymphocytes Relative 10 %   Lymphs Abs 2.1 0.7 - 4.0 K/uL   Monocytes Relative 7 %   Monocytes Absolute 1.4 (H) 0.1 - 1.0 K/uL   Eosinophils Relative 1 %   Eosinophils Absolute 0.2 0.0 - 0.5 K/uL   Basophils Relative 0 %   Basophils Absolute 0.1 0.0 - 0.1 K/uL   Immature Granulocytes 1 %   Abs Immature Granulocytes 0.10 (H) 0.00 - 0.07 K/uL    Comment: Performed at [facility], [address] [place], [place] [address]  Comprehensive metabolic panel     Status: Abnormal   Collection Time: 08/21/18  6:36 PM  Result Value Ref Range   Sodium 136 135 - 145 mmol/L   Potassium 3.6 3.5 - 5.1 mmol/L   Chloride 96 (L) 98 - 111 mmol/L   CO2 24 22  - 32 mmol/L   Glucose, Bld 153 (H) 70 - 99 mg/dL   BUN 12 8 - 23 mg/dL   Creatinine, Ser 0.69 0.44 - 1.00 mg/dL   Calcium 9.3 8.9 - 10.3 mg/dL   Total Protein 8.0 6.5 - 8.1 g/dL   Albumin 4.4 3.5 - 5.0 g/dL   AST 23 15 - 41 U/L   ALT 12 0 - 44 U/L   Alkaline Phosphatase 100 38 - 126 U/L   Total Bilirubin 0.5 0.3 - 1.2 mg/dL   GFR calc non Af Amer >60 >60 mL/min   GFR calc Af Amer >60 >60 mL/min   Anion gap 16 (H) 5 - 15    Comment: Performed at [facility], [address] [place], [place] [address]  Urinalysis, Complete w Microscopic     Status: Abnormal   Collection Time: 08/21/18  6:36 PM  Result Value Ref Range   Color, Urine YELLOW (A) YELLOW   APPearance CLEAR (A) CLEAR   Specific Gravity, Urine 1.016 1.005 - 1.030   pH 8.0 5.0 - 8.0   Glucose, UA NEGATIVE NEGATIVE mg/dL   Hgb urine dipstick NEGATIVE NEGATIVE   Bilirubin Urine NEGATIVE NEGATIVE   Ketones, ur NEGATIVE NEGATIVE mg/dL   Protein, ur 30 (A) NEGATIVE mg/dL   Nitrite NEGATIVE NEGATIVE   Leukocytes,Ua NEGATIVE NEGATIVE   RBC / HPF 0-5 0 - 5 RBC/hpf   WBC, UA 0-5 0 - 5 WBC/hpf   Bacteria, UA NONE SEEN NONE SEEN   Squamous Epithelial / LPF 0-5 0 - 5   Mucus PRESENT     Comment: Performed at [facility], [address] [place], [place] [address]  Troponin I - ONCE - STAT     Status: None   Collection Time: 08/21/18  6:36 PM  Result Value Ref Range   Troponin I <0.03 <0.03 ng/mL    Comment: Performed at [facility], [address] [place], [place] [address]  Lactic acid, plasma     Status: Abnormal   Collection Time: 08/21/18  6:36 PM  Result Value Ref Range   Lactic Acid, Venous 2.5 (HH) 0.5 - 1.9 mmol/L    Comment: CRITICAL RESULT CALLED TO, READ BACK BY AND VERIFIED WITH TOM NAGY 08/21/18 1923  KLW Performed at [facility], [address] [place], [place] [address]   SARS Coronavirus 2 [facility] order, Performed in [facility] hospital lab)     Status: None    Collection Time: 08/21/18 10:22 PM  Result Value Ref Range   SARS Coronavirus 2 NEGATIVE NEGATIVE    Comment: (NOTE) If result is NEGATIVE SARS-CoV-2 target nucleic acids are NOT DETECTED. The SARS-CoV-2 RNA is generally detectable in upper and lower  respiratory specimens during the acute phase of infection. The lowest  concentration of SARS-CoV-2 viral copies this assay can detect is 250  copies / mL. A negative result does not preclude SARS-CoV-2 infection  and should not be used as the sole basis for treatment or other  patient management decisions.  A negative result may occur with  improper specimen collection / handling, submission of specimen other  than nasopharyngeal swab, presence of viral mutation(s) within the  areas targeted by this assay, and inadequate number of viral copies  (<250 copies / mL). A negative result must be combined with clinical  observations, patient history, and epidemiological information. If result is POSITIVE SARS-CoV-2 target nucleic acids are DETECTED. The SARS-CoV-2 RNA is generally detectable in upper and lower  respiratory specimens dur ing the acute phase of infection.  Positive  results are indicative of active infection with SARS-CoV-2.  Clinical  correlation with patient history and other diagnostic information is  necessary to determine patient infection status.  Positive results do  not rule out bacterial infection or co-infection with other viruses. If result is PRESUMPTIVE POSTIVE SARS-CoV-2 nucleic acids MAY BE PRESENT.   A presumptive positive result was obtained on the submitted specimen  and confirmed on repeat testing.  While 2019 novel coronavirus  (SARS-CoV-2) nucleic acids may be present in the submitted sample  additional confirmatory testing may be necessary for epidemiological  and / or clinical management purposes  to differentiate between  SARS-CoV-2 and other Sarbecovirus currently known to infect humans.  If clinically  indicated additional testing with an alternate test  methodology [phone]) is advised. The SARS-CoV-2 RNA is generally  detectable in upper and lower respiratory sp ecimens during the acute  phase of infection. The expected result is Negative. Fact Sheet for Patients:  StrictlyIdeas.no Fact Sheet for Healthcare Providers: BankingDealers.co.za This test is not yet approved or cleared by the [place] FDA and has been authorized for detection and/or diagnosis of SARS-CoV-2 by FDA under an Emergency Use Authorization (EUA).  This EUA will remain in effect (meaning this test can be used) for the duration of the COVID-19 declaration under Section 564(b)(1) of the Act, 21 U.S.C. section 360bbb-3(b)(1), unless the authorization is terminated or revoked sooner. Performed at [facility], [address], [place], [place] [address]   ABO/Rh     Status: None   Collection Time: 08/21/18 10:23 PM  Result Value Ref Range   ABO/RH(D)      O POS Performed at [facility], [address] [place], [place] [address]    Ct Angio Chest Pe W And/or Wo Contrast  Result Date: 08/21/2018 CLINICAL DATA:  Shortness of breath and weakness. EXAM: CT ANGIOGRAPHY CHEST WITH CONTRAST TECHNIQUE: Multidetector CT imaging of the chest was performed using the standard protocol during bolus administration of intravenous contrast. Multiplanar CT image reconstructions and MIPs were obtained to evaluate the vascular anatomy. CONTRAST:  61mL OMNIPAQUE IOHEXOL 350 MG/ML SOLN COMPARISON:  Chest x-ray from same day. FINDINGS: Cardiovascular: Satisfactory opacification of the  pulmonary arteries to the segmental level. No evidence of pulmonary embolism. Mild enlargement of the main pulmonary artery, measuring 3.3 cm in diameter. Normal heart size. No pericardial effusion. No thoracic aortic aneurysm or dissection. Coronary, aortic arch, and branch vessel  atherosclerotic vascular disease. Mediastinum/Nodes: Prominent prevascular lymph node measuring 9 mm in short axis. Prominent right hilar lymph node, also measuring 9 mm in short axis. No enlarged axillary lymph nodes. The thyroid gland, trachea, and esophagus demonstrate no significant findings. Small hiatal hernia. Lungs/Pleura: Patchy peribronchovascular opacities in the posterior right upper lobe. 4 mm nodules in the right upper lobe (series 7, image 35) and right middle lobe (series 7, image 52). Calcified granuloma in the posterior right upper lobe. No pleural effusion or pneumothorax. Right greater than left lower lobe dependent atelectasis. Right upper and bilateral lower lobe peribronchial thickening. Upper Abdomen: No acute abnormality. Musculoskeletal: No chest wall abnormality. No acute or significant osseous findings. Prior bilateral total shoulder arthroplasties. Review of the MIP images confirms the above findings. IMPRESSION: 1. Posterior right upper lobe bronchopneumonia. 2. No evidence of pulmonary embolism. Mild main pulmonary artery dilatation, suggestive of pulmonary arterial hypertension. 3. 4 mm nodules in the right upper and middle lobes. No follow-up needed if patient is low-risk (and has no known or suspected primary neoplasm). Non-contrast chest CT can be considered in 12 months if patient is high-risk. This recommendation follows the consensus statement: Guidelines for Management of Incidental Pulmonary Nodules Detected on CT Images: From the Fleischner Society 2017; Radiology 2017; 284:228-243. 4. Aortic atherosclerosis (ICD10-I70.0). Electronically Signed   By: Titus Dubin M.D.   On: 08/21/2018 21:07   Dg Chest Portable 1 View  Result Date: 08/21/2018 CLINICAL DATA:  Cough EXAM: PORTABLE CHEST 1 VIEW COMPARISON:  06/14/2016 chest radiograph FINDINGS: Cardiac silhouette is normal. There is mild diffuse interstitial opacity, unchanged. No pleural effusion or pneumothorax.  Bilateral shoulder hemi arthroplasties. IMPRESSION: Mild chronic interstitial opacity, possibly chronic bronchitis. No focal airspace disease. Electronically Signed   By: Ulyses Jarred M.D.   On: 08/21/2018 19:24    Pending Labs Unresulted Labs (From admission, onward)    Start     Ordered   08/21/18 2319  Lactic acid, plasma  Now then every 2 hours,   STAT    Comments:  #2    08/21/18 2319   08/21/18 1932  Blood culture (routine x 2)  BLOOD CULTURE X 2,   STAT     08/21/18 1935   Signed and Held  CBC  (enoxaparin (LOVENOX)    CrCl >/= 30 ml/min)  Once,   R    Comments:  Baseline for enoxaparin therapy IF NOT ALREADY DRAWN.  Notify MD if PLT < 100 K.    Signed and Held   Signed and Held  Creatinine, serum  (enoxaparin (LOVENOX)    CrCl >/= 30 ml/min)  Once,   R    Comments:  Baseline for enoxaparin therapy IF NOT ALREADY DRAWN.    Signed and Held   Signed and Held  Creatinine, serum  (enoxaparin (LOVENOX)    CrCl >/= 30 ml/min)  Weekly,   R    Comments:  while on enoxaparin therapy    Signed and Held   Signed and Held  Basic metabolic panel  Tomorrow morning,   R     Signed and Held   Signed and Held  CBC  Tomorrow morning,   R     Signed and Held  Vitals/Pain Today's Vitals   08/21/18 2130 08/21/18 2200 08/21/18 2230 08/21/18 2300  BP: 120/66 (!) 97/56 106/60 108/60  Pulse: 82 79 79 71  Resp: (!) 23 (!) 21 (!) 21 10  Temp:   97.9 F (36.6 C)   TempSrc:   Oral   SpO2: 96% 97% 95% 95%  Weight:      Height:      PainSc:   0-No pain     Isolation Precautions Droplet and Contact precautions  Medications Medications  sodium chloride 0.9 % bolus 1,000 mL (0 mLs Intravenous Stopped 08/21/18 2202)  cefTRIAXone (ROCEPHIN) 1 g in sodium chloride 0.9 % 100 mL IVPB (0 g Intravenous Stopped 08/21/18 2048)  azithromycin (ZITHROMAX) 500 mg in sodium chloride 0.9 % 250 mL IVPB (0 mg Intravenous Stopped 08/21/18 2116)  ondansetron (ZOFRAN) injection 4 mg (4 mg Intravenous  Given 08/21/18 2056)  iohexol (OMNIPAQUE) 350 MG/ML injection 75 mL (75 mLs Intravenous Contrast Given 08/21/18 2038)    Mobility walks with device Low fall risk   Focused Assessments    R Recommendations: See Admitting Provider Note  Report given to:   Additional Notes:

## 2018-08-21 NOTE — ED Triage Notes (Signed)
Pt arrives to ED from home via [facility] with c/c of confusion, generalized weakness. Pt last seen normal at 0800 this morning by family. Pt awoke at 1600 with weakness/confusion. EMS reports seeing L side lean when standing. Transport vitals reported as 142/76, p80, O2 sat 94% on room air. Temp 100.5. Pt given 1000mg  tylenol enroute. Upon arrival, pt A&Ox4, NAD.

## 2018-08-21 NOTE — ED Notes (Addendum)
Date and time results received: 08/21/18 1920   Test: lactic acid Critical Value: 2.5  Name of Provider Notified: Dr Alfred Levins

## 2018-08-21 NOTE — ED Provider Notes (Signed)
[facility] Emergency Department Provider Note  ____________________________________________  Time seen: Approximately 7:19 PM  I have reviewed the triage vital signs and the nursing notes.   HISTORY  Chief Complaint Altered Mental Status and Weakness   HPI Shannon Higgins is a 84 y.o. female with a history of rheumatoid arthritis, dementia, stroke, hypothyroidism, diabetes, hypertension, breast cancer who presents for evaluation of confusion.  Patient arrives from home via EMS.  Patient complaining of productive cough which kept her up all night. Per patient's daughter who lives with her, patient woke up this morning complaining of feeling very cold and having a cough.  She did not have a fever.  She went back to bed and slept most of the day.  Around 4PM the daughter went to check on her, found that she had wet her bed.  She was able to ambulate to the bathroom to get cleaned up but she was very weak.  She was also slightly confused.  At that time she took her temperature which was 101F and EMS was called.  Daughter thinks the patient was vomiting last night.  Patient does not remember vomiting.  She denies any nausea right now or abdominal pain, she denies chest pain or shortness of breath.  Patient received 1000 mg of Tylenol per EMS in route.  Per EMS patient had a temp of 100.27F.     Past Medical History:  Diagnosis Date   Asthma    Breast cancer ([place])    Chronic sinusitis    Diabetes mellitus without complication ([place])    GERD (gastroesophageal reflux disease)    History of recurrent UTIs    Hypertension    Insomnia    Migraine    Neuropathic pain    Pneumonia    Rheumatoid arthritis ([place])    Sleep apnea    Stroke Shannon Higgins)    Thyroid disease     Patient Active Problem List   Diagnosis Date Noted   Acute non-recurrent frontal sinusitis 07/08/2018   Recurrent UTI 06/20/2018   Closed avulsion fracture of medial malleolus, right,  with routine healing, subsequent encounter 05/11/2018   Type 2 diabetes mellitus with peripheral neuropathy ([place]) 05/11/2018   Orthostatic hypotension 05/08/2018   Malignant neoplasm of upper-outer quadrant of left breast in female, estrogen receptor positive ([place]) 10/17/2017   MDD (major depressive disorder) 06/21/2017   Chronic midline low back pain with sciatica 01/13/2017   Sinusitis 12/16/2016   Hyperlipidemia 12/06/2016   Glaucoma 12/06/2016   Neuropathic pain 12/06/2016   Skin cancer (melanoma) ([place]) 12/06/2016   Confusion 12/06/2016   Chronic headaches 12/06/2016   Transient hypotension 06/14/2016   AKI (acute kidney injury) ([place]) 06/14/2016   Hypothyroidism 06/14/2016   Hyponatremia    Hypokalemia 04/26/2012   Anemia 04/26/2012   HTN (hypertension) 04/26/2012   GERD (gastroesophageal reflux disease) 04/26/2012   OSA (obstructive sleep apnea) 04/26/2012    Past Surgical History:  Procedure Laterality Date   ABDOMINAL HYSTERECTOMY     BACK SURGERY     BREAST LUMPECTOMY     CHOLECYSTECTOMY     KNEE SURGERY     SHOULDER SURGERY      Prior to Admission medications   Medication Sig Start Date End Date Taking? Authorizing Provider  anastrozole (ARIMIDEX) 1 MG tablet Take 1 mg by mouth daily.   Yes [provider]  aspirin 81 MG tablet Take 81 mg by mouth daily.   Yes [provider]  blood glucose meter  kit and supplies Dispense based on patient and insurance preference. Use up to 2 times daily as directed. (FOR ICD-10 E10.9, E11.9). 07/26/17  Yes Pleas Koch, NP  brimonidine (ALPHAGAN) 0.2 % ophthalmic solution Place 1 drop into both eyes 3 (three) times daily. Both eyes    Yes [provider]  cetirizine (ZYRTEC) 10 MG chewable tablet Chew 1 tablet by mouth once daily as needed for allergies. 12/26/17  Yes Pleas Koch, NP  donepezil (ARICEPT) 10 MG tablet Take 1 tablet daily Patient taking differently:  Take 10 mg by mouth daily.  02/23/18  Yes Cameron Sprang, MD  dorzolamide-timolol (COSOPT) 22.3-6.8 MG/ML ophthalmic solution Place 1 drop into both eyes 2 (two) times daily.   Yes [provider]  DULoxetine (CYMBALTA) 60 MG capsule Take 1 capsule by mouth once daily for depression. 12/26/17  Yes Pleas Koch, NP  fluticasone [facility]) 50 MCG/ACT nasal spray USE 2 SPRAYS IN EACH NOSTRIL EVERY DAY 08/17/18  Yes Pleas Koch, NP  gabapentin (NEURONTIN) 800 MG tablet TAKE 1 TABLET BY MOUTH THREE TIMES A DAY 06/13/18  Yes Pleas Koch, NP  latanoprost (XALATAN) 0.005 % ophthalmic solution Place 1 drop into both eyes at bedtime.   Yes [provider]  levothyroxine (SYNTHROID, LEVOTHROID) 125 MCG tablet Take 1 tablet (125 mcg total) by mouth daily before breakfast. Take 1 tablet by mouth every morning on an empty stomach with a full glass of water. No food or other medications for 30 minutes. 07/04/18  Yes Pleas Koch, NP  meclizine (ANTIVERT) 25 MG tablet Take 1 tablet (25 mg total) by mouth 2 (two) times daily as needed for dizziness. 06/21/17  Yes Pleas Koch, NP  metFORMIN (GLUCOPHAGE) 500 MG tablet Take 1 tablet by mouth twice daily with food for diabetes. 12/26/17  Yes Pleas Koch, NP  omeprazole (PRILOSEC) 20 MG capsule Take 20 mg by mouth 2 (two) times a day. 07/05/18  Yes [provider]  propranolol (INDERAL) 80 MG tablet Take 1 tablet by mouth once daily for blood pressure. 12/26/17  Yes Pleas Koch, NP  rosuvastatin (CRESTOR) 10 MG tablet Take 1 tablet by mouth once daily for cholesterol. 12/27/17  Yes Pleas Koch, NP  SUMAtriptan (IMITREX) 25 MG tablet Take as needed for migraine. Do not take more than twice a week Patient taking differently: Take 25 mg by mouth daily as needed for migraine. Do not take more than twice a week 02/23/18  Yes Cameron Sprang, MD  traZODone (DESYREL) 100 MG tablet TAKE 1 TABLET (100 MG TOTAL) BY  MOUTH AT BEDTIME AS NEEDED FOR SLEEP. 07/18/18  Yes Pleas Koch, NP    Allergies Patient has no known allergies.  Family History  Problem Relation Age of Onset   Diabetes Daughter    Hypertension Daughter    Fibromyalgia Daughter    GER disease Daughter    Fibromyalgia Daughter    Crohn's disease Daughter    Asthma Daughter    Hypertension Mother     Social History Social History   Tobacco Use   Smoking status: Never Smoker   Smokeless tobacco: Never Used  Substance Use Topics   Alcohol use: No   Drug use: No    Review of Systems  Constitutional: + fever, generalized weakness, confusion Eyes: Negative for visual changes. ENT: Negative for sore throat. Neck: No neck pain  Cardiovascular: Negative for chest pain. Respiratory: Negative for shortness of breath. +  cough Gastrointestinal: Negative for abdominal pain, vomiting or diarrhea. Genitourinary: Negative for dysuria. Musculoskeletal: Negative for back pain. Skin: Negative for rash. Neurological: Negative for headaches, weakness or numbness. Psych: No SI or HI  ____________________________________________   PHYSICAL EXAM:  VITAL SIGNS: ED Triage Vitals  Enc Vitals Group     BP 08/21/18 1832 (!) 158/85     Pulse Rate 08/21/18 1832 86     Resp 08/21/18 1832 16     Temp 08/21/18 1832 99.2 F (37.3 C)     Temp Source 08/21/18 1832 Oral     SpO2 08/21/18 1832 94 %     Weight 08/21/18 1833 150 lb (68 kg)     Height 08/21/18 1833 _0  (1.626 m)     Head Circumference --      Peak Flow --      Pain Score 08/21/18 1833 0     Pain Loc --      Pain Edu? --      Excl. in [place]? --     Constitutional: Alert and oriented x 3. Well appearing and in no apparent distress. HEENT:      Head: Normocephalic and atraumatic.         Eyes: Conjunctivae are normal. Sclera is non-icteric.       Mouth/Throat: Mucous membranes are moist.       Neck: Supple with no signs of meningismus. Cardiovascular:  Regular rate and rhythm. No murmurs, gallops, or rubs. 2+ symmetrical distal pulses are present in all extremities. No JVD. Respiratory: Normal respiratory effort. Lungs are clear to auscultation bilaterally. No wheezes, crackles, or rhonchi.  Gastrointestinal: Soft, non tender, and non distended with positive bowel sounds. No rebound or guarding. Musculoskeletal: Nontender with normal range of motion in all extremities. No edema, cyanosis, or erythema of extremities. Neurologic: Normal speech and language. Face is symmetric.  Intact strength and sensation x4, no dysmetria or pronator drift. Skin: Skin is warm, dry and intact. No rash noted. Psychiatric: Mood and affect are normal. Speech and behavior are normal.  ____________________________________________   LABS (all labs ordered are listed, but only abnormal results are displayed)  Labs Reviewed  CBC WITH DIFFERENTIAL/PLATELET - Abnormal; Notable for the following components:      Result Value   WBC 21.1 (*)    Neutro Abs 17.2 (*)    Monocytes Absolute 1.4 (*)    Abs Immature Granulocytes 0.10 (*)    All other components within normal limits  COMPREHENSIVE METABOLIC PANEL - Abnormal; Notable for the following components:   Chloride 96 (*)    Glucose, Bld 153 (*)    Anion gap 16 (*)    All other components within normal limits  URINALYSIS, COMPLETE (UACMP) WITH MICROSCOPIC - Abnormal; Notable for the following components:   Color, Urine YELLOW (*)    APPearance CLEAR (*)    Protein, ur 30 (*)    All other components within normal limits  LACTIC ACID, PLASMA - Abnormal; Notable for the following components:   Lactic Acid, Venous 2.5 (*)    All other components within normal limits  CULTURE, BLOOD (ROUTINE X 2)  CULTURE, BLOOD (ROUTINE X 2)  SARS CORONAVIRUS 2 ([facility]ins ORDER, [place] [facility])  TROPONIN I  ABO/RH   ____________________________________________  EKG  ED ECG REPORT I, Rudene Re, the attending physician, personally viewed and interpreted this ECG.  Normal sinus rhythm, rate of 82, normal intervals, normal axis, no ST elevations or depressions.  Normal EKG. ____________________________________________  RADIOLOGY  I have personally reviewed the images performed during this visit and I agree with the Radiologist's read.   Interpretation by Radiologist:  Ct Angio Chest Pe W And/or Wo Contrast  Result Date: 08/21/2018 CLINICAL DATA:  Shortness of breath and weakness. EXAM: CT ANGIOGRAPHY CHEST WITH CONTRAST TECHNIQUE: Multidetector CT imaging of the chest was performed using the standard protocol during bolus administration of intravenous contrast. Multiplanar CT image reconstructions and MIPs were obtained to evaluate the vascular anatomy. CONTRAST:  72m OMNIPAQUE IOHEXOL 350 MG/ML SOLN COMPARISON:  Chest x-ray from same day. FINDINGS: Cardiovascular: Satisfactory opacification of the pulmonary arteries to the segmental level. No evidence of pulmonary embolism. Mild enlargement of the main pulmonary artery, measuring 3.3 cm in diameter. Normal heart size. No pericardial effusion. No thoracic aortic aneurysm or dissection. Coronary, aortic arch, and branch vessel atherosclerotic vascular disease. Mediastinum/Nodes: Prominent prevascular lymph node measuring 9 mm in short axis. Prominent right hilar lymph node, also measuring 9 mm in short axis. No enlarged axillary lymph nodes. The thyroid gland, trachea, and esophagus demonstrate no significant findings. Small hiatal hernia. Lungs/Pleura: Patchy peribronchovascular opacities in the posterior right upper lobe. 4 mm nodules in the right upper lobe (series 7, image 35) and right middle lobe (series 7, image 52). Calcified granuloma in the posterior right upper lobe. No pleural effusion or pneumothorax. Right greater than left lower lobe dependent atelectasis. Right upper and bilateral lower lobe peribronchial thickening.  Upper Abdomen: No acute abnormality. Musculoskeletal: No chest wall abnormality. No acute or significant osseous findings. Prior bilateral total shoulder arthroplasties. Review of the MIP images confirms the above findings. IMPRESSION: 1. Posterior right upper lobe bronchopneumonia. 2. No evidence of pulmonary embolism. Mild main pulmonary artery dilatation, suggestive of pulmonary arterial hypertension. 3. 4 mm nodules in the right upper and middle lobes. No follow-up needed if patient is low-risk (and has no known or suspected primary neoplasm). Non-contrast chest CT can be considered in 12 months if patient is high-risk. This recommendation follows the consensus statement: Guidelines for Management of Incidental Pulmonary Nodules Detected on CT Images: From the Fleischner Society 2017; Radiology 2017; 284:228-243. 4. Aortic atherosclerosis (ICD10-I70.0). Electronically Signed   By: WTitus DubinM.D.   On: 08/21/2018 21:07   Dg Chest Portable 1 View  Result Date: 08/21/2018 CLINICAL DATA:  Cough EXAM: PORTABLE CHEST 1 VIEW COMPARISON:  06/14/2016 chest radiograph FINDINGS: Cardiac silhouette is normal. There is mild diffuse interstitial opacity, unchanged. No pleural effusion or pneumothorax. Bilateral shoulder hemi arthroplasties. IMPRESSION: Mild chronic interstitial opacity, possibly chronic bronchitis. No focal airspace disease. Electronically Signed   By: KUlyses JarredM.D.   On: 08/21/2018 19:24     ____________________________________________   PROCEDURES  Procedure(s) performed: None Procedures Critical Care performed: yes  CRITICAL CARE Performed by: CRudene Re ?  Total critical care time: 35 min  Critical care time was exclusive of separately billable procedures and treating other patients.  Critical care was necessary to treat or prevent imminent or life-threatening deterioration.  Critical care was time spent personally by me on the following activities:  development of treatment plan with patient and/or surrogate as well as nursing, discussions with consultants, evaluation of patient's response to treatment, examination of patient, obtaining history from patient or surrogate, ordering and performing treatments and interventions, ordering and review of laboratory studies, ordering and review of radiographic studies, pulse oximetry and re-evaluation of patient's condition.  ____________________________________________   INITIAL IMPRESSION / ASSESSMENT AND PLAN /  ED COURSE   84 y.o. female with a history of rheumatoid arthritis, dementia, stroke, hypothyroidism, diabetes, hypertension, breast cancer who presents for evaluation of confusion, generalized weakness, cough, fever.  Patient with slightly low oxygen fluctuating between 89 and 93%, lungs are clear to auscultation otherwise.  Patient is defervescing with no tachycardia or hypotension.  Labs showing elevated white count of 21 and lactic of 2.5.  Chest x-ray does not show an infiltrate however suspicion high at this time for pneumonia.  We will send patient for CT.  Will give IV fluids, Rocephin and azithromycin for community-acquired pneumonia.  UA is negative for UTI.  Abdomen is soft with no tenderness. No meningeal signs.    _________________________ 2:95 PM on 08/21/2018 -----------------------------------------  CT confirms RUL PNA. Patient will be admitted for IV abx. Family aware, spoke with daughter on the phone.    As part of my medical decision making, I reviewed the following data within the [place] History obtained from family, Nursing notes reviewed and incorporated, Labs reviewed , EKG interpreted , Old chart reviewed, Radiograph reviewed , Discussed with admitting physician , Notes from prior ED visits and [place] Controlled Substance Database    Pertinent labs & imaging results that were available during my care of the patient were reviewed by me and considered  in my medical decision making (see chart for details).    ____________________________________________   FINAL CLINICAL IMPRESSION(S) / ED DIAGNOSES  Final diagnoses:  Community acquired pneumonia of right lung, unspecified part of lung  Acute respiratory failure with hypoxia ([place])  Sepsis with acute hypoxic respiratory failure without septic shock, due to unspecified organism [facility])      NEW MEDICATIONS STARTED DURING THIS VISIT:  ED Discharge Orders    None       Note:  This document was prepared using Dragon voice recognition software and may include unintentional dictation errors.    Rudene Re, MD 08/21/18 2114

## 2018-08-21 NOTE — H&P (Signed)
[facility] NAME: Shannon Higgins    MR#:  [id number]  DATE OF BIRTH:  [birth year]  DATE OF ADMISSION:  08/21/2018  PRIMARY CARE PHYSICIAN: Pleas Koch, NP   REQUESTING/REFERRING PHYSICIAN: Alfred Levins, MD  CHIEF COMPLAINT:   Chief Complaint  Patient presents with  . Altered Mental Status  . Weakness    HISTORY OF PRESENT ILLNESS:  Shannon Higgins  is a 84 y.o. female who presents with chief complaint as above.  Patient presents to the ED with several days worsening productive cough and weakness.  Family reports she also had some episodes of confusion today.  She also had a fever at home 101 Fahrenheit.  Here in the ED she is noted to have a right-sided pneumonia, and meets sepsis criteria.  Hospitalist called for admission  PAST MEDICAL HISTORY:   Past Medical History:  Diagnosis Date  . Asthma   . Breast cancer ([place])   . Chronic sinusitis   . Diabetes mellitus without complication ([place])   . GERD (gastroesophageal reflux disease)   . History of recurrent UTIs   . Hypertension   . Insomnia   . Migraine   . Neuropathic pain   . Pneumonia   . Rheumatoid arthritis ([place])   . Sleep apnea   . Stroke ([place])   . Thyroid disease      PAST SURGICAL HISTORY:   Past Surgical History:  Procedure Laterality Date  . ABDOMINAL HYSTERECTOMY    . BACK SURGERY    . BREAST LUMPECTOMY    . CHOLECYSTECTOMY    . KNEE SURGERY    . SHOULDER SURGERY       SOCIAL HISTORY:   Social History   Tobacco Use  . Smoking status: Never Smoker  . Smokeless tobacco: Never Used  Substance Use Topics  . Alcohol use: No     FAMILY HISTORY:   Family History  Problem Relation Age of Onset  . Diabetes Daughter   . Hypertension Daughter   . Fibromyalgia Daughter   . GER disease Daughter   . Fibromyalgia Daughter   . Crohn's disease Daughter   . Asthma Daughter   . Hypertension Mother      DRUG ALLERGIES:  No Known  Allergies  MEDICATIONS AT HOME:   Prior to Admission medications   Medication Sig Start Date End Date Taking? Authorizing Provider  anastrozole (ARIMIDEX) 1 MG tablet Take 1 mg by mouth daily.   Yes [provider]  aspirin 81 MG tablet Take 81 mg by mouth daily.   Yes [provider]  blood glucose meter kit and supplies Dispense based on patient and insurance preference. Use up to 2 times daily as directed. (FOR ICD-10 E10.9, E11.9). 07/26/17  Yes Pleas Koch, NP  brimonidine (ALPHAGAN) 0.2 % ophthalmic solution Place 1 drop into both eyes 3 (three) times daily. Both eyes    Yes [provider]  cetirizine (ZYRTEC) 10 MG chewable tablet Chew 1 tablet by mouth once daily as needed for allergies. 12/26/17  Yes Pleas Koch, NP  donepezil (ARICEPT) 10 MG tablet Take 1 tablet daily Patient taking differently: Take 10 mg by mouth daily.  02/23/18  Yes Cameron Sprang, MD  dorzolamide-timolol (COSOPT) 22.3-6.8 MG/ML ophthalmic solution Place 1 drop into both eyes 2 (two) times daily.   Yes [provider]  DULoxetine (CYMBALTA) 60 MG capsule Take 1 capsule by mouth once daily for depression. 12/26/17  Yes  Pleas Koch, NP  fluticasone [facility]) 50 MCG/ACT nasal spray USE 2 SPRAYS IN EACH NOSTRIL EVERY DAY 08/17/18  Yes Pleas Koch, NP  gabapentin (NEURONTIN) 800 MG tablet TAKE 1 TABLET BY MOUTH THREE TIMES A DAY 06/13/18  Yes Pleas Koch, NP  latanoprost (XALATAN) 0.005 % ophthalmic solution Place 1 drop into both eyes at bedtime.   Yes [provider]  levothyroxine (SYNTHROID, LEVOTHROID) 125 MCG tablet Take 1 tablet (125 mcg total) by mouth daily before breakfast. Take 1 tablet by mouth every morning on an empty stomach with a full glass of water. No food or other medications for 30 minutes. 07/04/18  Yes Pleas Koch, NP  meclizine (ANTIVERT) 25 MG tablet Take 1 tablet (25 mg total) by mouth 2 (two) times daily as needed  for dizziness. 06/21/17  Yes Pleas Koch, NP  metFORMIN (GLUCOPHAGE) 500 MG tablet Take 1 tablet by mouth twice daily with food for diabetes. 12/26/17  Yes Pleas Koch, NP  omeprazole (PRILOSEC) 20 MG capsule Take 20 mg by mouth 2 (two) times a day. 07/05/18  Yes [provider]  propranolol (INDERAL) 80 MG tablet Take 1 tablet by mouth once daily for blood pressure. 12/26/17  Yes Pleas Koch, NP  rosuvastatin (CRESTOR) 10 MG tablet Take 1 tablet by mouth once daily for cholesterol. 12/27/17  Yes Pleas Koch, NP  SUMAtriptan (IMITREX) 25 MG tablet Take as needed for migraine. Do not take more than twice a week Patient taking differently: Take 25 mg by mouth daily as needed for migraine. Do not take more than twice a week 02/23/18  Yes Cameron Sprang, MD  traZODone (DESYREL) 100 MG tablet TAKE 1 TABLET (100 MG TOTAL) BY MOUTH AT BEDTIME AS NEEDED FOR SLEEP. 07/18/18  Yes Pleas Koch, NP    REVIEW OF SYSTEMS:  Review of Systems  Constitutional: Positive for fever and malaise/fatigue. Negative for chills and weight loss.  HENT: Negative for ear pain, hearing loss and tinnitus.   Eyes: Negative for blurred vision, double vision, pain and redness.  Respiratory: Positive for cough and sputum production. Negative for hemoptysis and shortness of breath.   Cardiovascular: Negative for chest pain, palpitations, orthopnea and leg swelling.  Gastrointestinal: Negative for abdominal pain, constipation, diarrhea, nausea and vomiting.  Genitourinary: Negative for dysuria, frequency and hematuria.  Musculoskeletal: Negative for back pain, joint pain and neck pain.  Skin:       No acne, rash, or lesions  Neurological: Negative for dizziness, tremors, focal weakness and weakness.  Endo/Heme/Allergies: Negative for polydipsia. Does not bruise/bleed easily.  Psychiatric/Behavioral: Negative for depression. The patient is not nervous/anxious and does not have insomnia.       VITAL SIGNS:   Vitals:   08/21/18 2130 08/21/18 2200 08/21/18 2230 08/21/18 2300  BP: 120/66 (!) 97/56 106/60 108/60  Pulse: 82 79 79 71  Resp: (!) 23 (!) 21 (!) 21 10  Temp:   97.9 F (36.6 C)   TempSrc:   Oral   SpO2: 96% 97% 95% 95%  Weight:      Height:       Wt Readings from Last 3 Encounters:  08/21/18 68 kg  07/06/18 74.1 kg  07/02/18 72.1 kg    PHYSICAL EXAMINATION:  Physical Exam  Vitals reviewed. Constitutional: She is oriented to person, place, and time. She appears well-developed and well-nourished. No distress.  HENT:  Head: Normocephalic and atraumatic.  Mouth/Throat: Oropharynx is clear and moist.  Eyes: Pupils are equal, round, and reactive to light. Conjunctivae and EOM are normal. No scleral icterus.  Neck: Normal range of motion. Neck supple. No JVD present. No thyromegaly present.  Cardiovascular: Normal rate, regular rhythm and intact distal pulses. Exam reveals no gallop and no friction rub.  No murmur heard. Respiratory: Effort normal. No respiratory distress. She has no wheezes.  Right-sided rhonchi  GI: Soft. Bowel sounds are normal. She exhibits no distension. There is no abdominal tenderness.  Musculoskeletal: Normal range of motion.        General: No edema.     Comments: No arthritis, no gout  Lymphadenopathy:    She has no cervical adenopathy.  Neurological: She is alert and oriented to person, place, and time. No cranial nerve deficit.  No dysarthria, no aphasia  Skin: Skin is warm and dry. No rash noted. No erythema.  Psychiatric: She has a normal mood and affect. Her behavior is normal. Judgment and thought content normal.    LABORATORY PANEL:   CBC Recent Labs  Lab 08/21/18 1836  WBC 21.1*  HGB 12.9  HCT 39.9  PLT 291   ------------------------------------------------------------------------------------------------------------------  Chemistries  Recent Labs  Lab 08/21/18 1836  NA 136  K 3.6  CL 96*  CO2 24   GLUCOSE 153*  BUN 12  CREATININE 0.69  CALCIUM 9.3  AST 23  ALT 12  ALKPHOS 100  BILITOT 0.5   ------------------------------------------------------------------------------------------------------------------  Cardiac Enzymes Recent Labs  Lab 08/21/18 1836  TROPONINI <0.03   ------------------------------------------------------------------------------------------------------------------  RADIOLOGY:  Ct Angio Chest Pe W And/or Wo Contrast  Result Date: 08/21/2018 CLINICAL DATA:  Shortness of breath and weakness. EXAM: CT ANGIOGRAPHY CHEST WITH CONTRAST TECHNIQUE: Multidetector CT imaging of the chest was performed using the standard protocol during bolus administration of intravenous contrast. Multiplanar CT image reconstructions and MIPs were obtained to evaluate the vascular anatomy. CONTRAST:  64m OMNIPAQUE IOHEXOL 350 MG/ML SOLN COMPARISON:  Chest x-ray from same day. FINDINGS: Cardiovascular: Satisfactory opacification of the pulmonary arteries to the segmental level. No evidence of pulmonary embolism. Mild enlargement of the main pulmonary artery, measuring 3.3 cm in diameter. Normal heart size. No pericardial effusion. No thoracic aortic aneurysm or dissection. Coronary, aortic arch, and branch vessel atherosclerotic vascular disease. Mediastinum/Nodes: Prominent prevascular lymph node measuring 9 mm in short axis. Prominent right hilar lymph node, also measuring 9 mm in short axis. No enlarged axillary lymph nodes. The thyroid gland, trachea, and esophagus demonstrate no significant findings. Small hiatal hernia. Lungs/Pleura: Patchy peribronchovascular opacities in the posterior right upper lobe. 4 mm nodules in the right upper lobe (series 7, image 35) and right middle lobe (series 7, image 52). Calcified granuloma in the posterior right upper lobe. No pleural effusion or pneumothorax. Right greater than left lower lobe dependent atelectasis. Right upper and bilateral lower lobe  peribronchial thickening. Upper Abdomen: No acute abnormality. Musculoskeletal: No chest wall abnormality. No acute or significant osseous findings. Prior bilateral total shoulder arthroplasties. Review of the MIP images confirms the above findings. IMPRESSION: 1. Posterior right upper lobe bronchopneumonia. 2. No evidence of pulmonary embolism. Mild main pulmonary artery dilatation, suggestive of pulmonary arterial hypertension. 3. 4 mm nodules in the right upper and middle lobes. No follow-up needed if patient is low-risk (and has no known or suspected primary neoplasm). Non-contrast chest CT can be considered in 12 months if patient is high-risk. This recommendation follows the consensus statement: Guidelines for Management of Incidental Pulmonary Nodules Detected on CT Images: From the Fleischner  Society 2017; Radiology 2017; 284:228-243. 4. Aortic atherosclerosis (ICD10-I70.0). Electronically Signed   By: Titus Dubin M.D.   On: 08/21/2018 21:07   Dg Chest Portable 1 View  Result Date: 08/21/2018 CLINICAL DATA:  Cough EXAM: PORTABLE CHEST 1 VIEW COMPARISON:  06/14/2016 chest radiograph FINDINGS: Cardiac silhouette is normal. There is mild diffuse interstitial opacity, unchanged. No pleural effusion or pneumothorax. Bilateral shoulder hemi arthroplasties. IMPRESSION: Mild chronic interstitial opacity, possibly chronic bronchitis. No focal airspace disease. Electronically Signed   By: Ulyses Jarred M.D.   On: 08/21/2018 19:24    EKG:   Orders placed or performed during the hospital encounter of 08/21/18  . ED EKG  . ED EKG    IMPRESSION AND PLAN:  Principal Problem:   Sepsis ([place]) -IV antibiotics initiated, lactic acid was initially elevated, but came down to normal with administration of IV fluids.  Sepsis is due to pneumonia.  Cultures sent from the ED.  Blood pressure stable.  Novel coronavirus testing was negative Active Problems:   CAP (community acquired pneumonia) -IV antibiotics as  above and PRN supportive treatment   HTN (hypertension) -home dose antihypertensives   Type 2 diabetes mellitus with peripheral neuropathy (HCC) -sliding scale insulin coverage and carb modified diet   GERD (gastroesophageal reflux disease) -home dose PPI   Hypothyroidism -home dose thyroid replacement   Hyperlipidemia -home dose antilipid  Chart review performed and case discussed with ED provider. Labs, imaging and/or ECG reviewed by provider and discussed with patient/family. Management plans discussed with the patient and/or family.  DVT PROPHYLAXIS: SubQ lovenox   GI PROPHYLAXIS:  PPI   ADMISSION STATUS: Inpatient     CODE STATUS: Full Code Status History    Date Active Date Inactive Code Status Order ID Comments User Context   05/08/2018 3014 05/11/2018 2055 Full Code [id number]  Rise Patience, MD ED   06/14/2016 0244 06/16/2016 1520 Full Code [id number]  Norval Morton, MD Inpatient   04/26/2012 2006 04/29/2012 1806 Full Code [id number]  Louis Meckel, RN Inpatient      TOTAL TIME TAKING CARE OF THIS PATIENT: 45 minutes.   Ethlyn Daniels 08/21/2018, 11:31 PM  [facility]  Office  [phone]  CC: Primary care physician; Pleas Koch, NP  Note:  This document was prepared using Dragon voice recognition software and may include unintentional dictation errors.

## 2018-08-22 ENCOUNTER — Telehealth: Payer: Self-pay

## 2018-08-22 LAB — BASIC METABOLIC PANEL
Anion gap: 10 (ref 5–15)
BUN: 11 mg/dL (ref 8–23)
CO2: 25 mmol/L (ref 22–32)
Calcium: 8.7 mg/dL — ABNORMAL LOW (ref 8.9–10.3)
Chloride: 105 mmol/L (ref 98–111)
Creatinine, Ser: 0.67 mg/dL (ref 0.44–1.00)
GFR calc Af Amer: >60 mL/min (ref >60)
GFR calc non Af Amer: >60 mL/min (ref >60)
Glucose, Bld: 108 mg/dL — ABNORMAL HIGH (ref 70–99)
Potassium: 3.5 mmol/L (ref 3.5–5.1)
Sodium: 140 mmol/L (ref 135–145)

## 2018-08-22 LAB — CBC
HCT: 36.2 % (ref 36.0–46.0)
Hemoglobin: 11.1 g/dL — ABNORMAL LOW (ref 12.0–15.0)
MCH: 27.5 pg (ref 26.0–34.0)
MCHC: 30.7 g/dL (ref 30.0–36.0)
MCV: 89.8 fL (ref 80.0–100.0)
Platelets: 258 10*3/uL (ref 150–400)
RBC: 4.03 MIL/uL (ref 3.87–5.11)
RDW: 13.3 % (ref 11.5–15.5)
WBC: 17.9 10*3/uL — ABNORMAL HIGH (ref 4.0–10.5)
nRBC: 0 % (ref 0.0–0.2)

## 2018-08-22 LAB — GLUCOSE, CAPILLARY
Glucose-Capillary: 116 mg/dL — ABNORMAL HIGH (ref 70–99)
Glucose-Capillary: 110 mg/dL — ABNORMAL HIGH (ref 70–99)
Glucose-Capillary: 123 mg/dL — ABNORMAL HIGH (ref 70–99)

## 2018-08-22 LAB — LACTIC ACID, PLASMA: Lactic Acid, Venous: 1.3 mmol/L (ref 0.5–1.9)

## 2018-08-22 MED ORDER — LEVOFLOXACIN 750 MG PO TABS: 750.0000 mg | ORAL_TABLET | Freq: Every day | ORAL | 0 refills | Status: AC

## 2018-08-22 MED ORDER — PANTOPRAZOLE SODIUM 20 MG PO TBEC
20.0000 mg | DELAYED_RELEASE_TABLET | Freq: Every day | ORAL | Status: DC
  Administered 2018-08-22: 10:00:00 20 mg via ORAL
  Filled 2018-08-22: qty 1

## 2018-08-22 MED ORDER — ROSUVASTATIN CALCIUM 10 MG PO TABS: 10.0000 mg | ORAL_TABLET | Freq: Every day | ORAL | Status: DC

## 2018-08-22 MED ORDER — ANASTROZOLE 1 MG PO TABS
1.0000 mg | ORAL_TABLET | Freq: Every day | ORAL | Status: DC
  Administered 2018-08-22: 1 mg via ORAL
  Filled 2018-08-22: qty 1

## 2018-08-22 MED ORDER — SODIUM CHLORIDE 0.9 % IV SOLN
500.0000 mg | INTRAVENOUS | Status: DC
  Filled 2018-08-22: qty 500

## 2018-08-22 MED ORDER — ACETAMINOPHEN 650 MG RE SUPP: 650.0000 mg | Freq: Four times a day (QID) | RECTAL | Status: DC | PRN

## 2018-08-22 MED ORDER — ASPIRIN EC 81 MG PO TBEC
81.0000 mg | DELAYED_RELEASE_TABLET | Freq: Every day | ORAL | Status: DC
  Administered 2018-08-22: 09:00:00 81 mg via ORAL
  Filled 2018-08-22: qty 1

## 2018-08-22 MED ORDER — ACETAMINOPHEN 325 MG PO TABS: 650.0000 mg | ORAL_TABLET | Freq: Four times a day (QID) | ORAL | Status: DC | PRN

## 2018-08-22 MED ORDER — SODIUM CHLORIDE 0.9 % IV SOLN
1.0000 g | INTRAVENOUS | Status: DC
  Filled 2018-08-22: qty 10

## 2018-08-22 MED ORDER — ONDANSETRON HCL 4 MG/2ML IJ SOLN: 4.0000 mg | Freq: Four times a day (QID) | INTRAMUSCULAR | Status: DC | PRN

## 2018-08-22 MED ORDER — INSULIN ASPART 100 UNIT/ML ~~LOC~~ SOLN
0.0000 [IU] | Freq: Three times a day (TID) | SUBCUTANEOUS | Status: DC
  Administered 2018-08-22: 1 [IU] via SUBCUTANEOUS
  Filled 2018-08-22: qty 1

## 2018-08-22 MED ORDER — ONDANSETRON HCL 4 MG PO TABS: 4.0000 mg | ORAL_TABLET | Freq: Four times a day (QID) | ORAL | Status: DC | PRN

## 2018-08-22 MED ORDER — SODIUM CHLORIDE 0.9 % IV SOLN
INTRAVENOUS | Status: DC
  Administered 2018-08-22: 01:00:00 via INTRAVENOUS

## 2018-08-22 MED ORDER — ENOXAPARIN SODIUM 40 MG/0.4ML ~~LOC~~ SOLN
40.0000 mg | SUBCUTANEOUS | Status: DC
  Administered 2018-08-22: 40 mg via SUBCUTANEOUS
  Filled 2018-08-22: qty 0.4

## 2018-08-22 MED ORDER — TRAZODONE HCL 50 MG PO TABS: 100.0000 mg | ORAL_TABLET | Freq: Every evening | ORAL | Status: DC | PRN

## 2018-08-22 MED ORDER — LEVOTHYROXINE SODIUM 50 MCG PO TABS
125.0000 ug | ORAL_TABLET | Freq: Every day | ORAL | Status: DC
  Administered 2018-08-22: 09:00:00 125 ug via ORAL
  Filled 2018-08-22: qty 3

## 2018-08-22 NOTE — Telephone Encounter (Signed)
Per chart review tab pt was admitted to [facility].

## 2018-08-22 NOTE — ED Notes (Signed)
Report given to the floor at this time.   

## 2018-08-22 NOTE — Telephone Encounter (Signed)
[facility] Patient Name: Shannon Higgins Gender: Female DOB: [birth year] Age: 84 Y 3 M 25 D Return Phone Number: [phone] (Primary), [phone] (Secondary) [place] [address] Client [place] Night - Client Client Site [place] Physician Alma Friendly - NP Contact Type Call Who Is Calling Patient / Member / Family / Caregiver Call Type Triage / Clinical Caller Name [name] Relationship To Patient Daughter Return Phone Number [phone] (Primary) Chief Complaint CONFUSION - new onset Reason for Call Symptomatic / Request for Bee states her mother has a temp of 101.4 and not responding to questions. States pt thinks she is in the hospital and not sure who she is. Translation No Nurse Assessment Nurse: London Pepper, RN, Jeneen Rinks Date/Time Eilene Ghazi Time): 08/21/2018 5:17:59 PM Confirm and document reason for call. If symptomatic, describe symptoms. ---Caller states pt has a temp of 101.4 and not responding to questions. States been coughing/vomiting all night. States pt took a nap and went into check on her and she was in fetal position, urinated on herself, and confused: thinks she is in the hospital and not sure who she is. Bp 128/74. Has the patient had close contact with a person known or suspected to have the novel coronavirus illness OR traveled / lives in area with major community spread (including international travel) in the last 14 days from the onset of symptoms? * If Asymptomatic, screen for exposure and travel within the last 14 days. ---No Does the patient have any new or worsening symptoms? ---Yes Will a triage be completed? ---Yes Related visit to physician within the last 2 weeks? ---No Does the PT have any chronic conditions? (i.e. diabetes, asthma,  this includes High risk factors for pregnancy, etc.) ---Yes List chronic conditions. ---HTN, asthma, RA, migraines, shoulder/knee replacements, hyperlipidemia, neuropathy Is this a behavioral health or substance abuse call? ---No Guidelines Guideline Title Affirmed Question Affirmed Notes Nurse Date/Time Eilene Ghazi Time) Neurologic Deficit Difficult to awaken or acting confused (e.g., London Pepper, RN, Jeneen Rinks 08/21/2018 5:21:15 PM PLEASE NOTE: All timestamps contained within this report are represented as [place] Standard Time. CONFIDENTIALTY NOTICE: This fax transmission is intended only for the addressee. It contains information that is legally privileged, confidential or otherwise protected from use or disclosure. If you are not the intended recipient, you are strictly prohibited from reviewing, disclosing, copying using or disseminating any of this information or taking any action in reliance on or regarding this information. If you have received this fax in error, please notify us immediately by telephone so that we can arrange for its return to [place]. Phone: [phone], Toll-Free: [phone], Fax: [phone] Page: 2 of 2 Call Id: [id number] Guidelines Guideline Title Affirmed Question Affirmed Notes Nurse Date/Time Eilene Ghazi Time) disoriented, slurred speech) Disp. Time Eilene Ghazi Time) Disposition Final User 08/21/2018 5:16:32 PM Send to Urgent Queue Merrilee Seashore 08/21/2018 5:29:05 PM 911 Outcome Documentation London Pepper, RN, Jeneen Rinks Reason: unable to reach caller- presumably on phone w/ EMS 08/21/2018 5:22:14 PM Call EMS 911 Now Yes London Pepper, RN, Martin Majestic Disagree/Comply Comply Caller Understands Yes PreDisposition InappropriateToAsk Care Advice Given Per Guideline CALL EMS 911 NOW: * Immediate medical attention is needed. You need to hang up and call 911 (or an ambulance). * Triager Discretion: I'll call you back in a few minutes to be sure you were able to reach them. CARE  ADVICE given per Neurologic Deficit (Adult) guideline.  Referrals [facility] - ED

## 2018-08-22 NOTE — Progress Notes (Signed)
SATURATION QUALIFICATIONS: (This note is used to comply with regulatory documentation for home oxygen)   Patient Saturations on Room Air while ambulating = 94%  Madlyn Frankel, RN

## 2018-08-22 NOTE — Telephone Encounter (Signed)
Noted, chart reviewed. Admitted for CAP.

## 2018-08-22 NOTE — Discharge Summary (Signed)
[facility] NAME: Shannon Higgins    MR#:  [id number]  DATE OF BIRTH:  [birth year]  DATE OF ADMISSION:  08/21/2018 ADMITTING PHYSICIAN: Lance Coon, MD  DATE OF DISCHARGE: 08/22/2018  PRIMARY CARE PHYSICIAN: Pleas Koch, NP    ADMISSION DIAGNOSIS:  Acute respiratory failure with hypoxia ([place]) [J96.01] Community acquired pneumonia of right lung, unspecified part of lung [J18.9] Sepsis with acute hypoxic respiratory failure without septic shock, due to unspecified organism ([place]) [A41.9, R65.20, J96.01]  DISCHARGE DIAGNOSIS:  Principal Problem:   Sepsis ([place]) Active Problems:   HTN (hypertension)   GERD (gastroesophageal reflux disease)   CAP (community acquired pneumonia)   Hypothyroidism   Hyperlipidemia   Type 2 diabetes mellitus with peripheral neuropathy ([place])   SECONDARY DIAGNOSIS:   Past Medical History:  Diagnosis Date  . Asthma   . Breast cancer ([place])   . Chronic sinusitis   . Diabetes mellitus without complication ([place])   . GERD (gastroesophageal reflux disease)   . History of recurrent UTIs   . Hypertension   . Insomnia   . Migraine   . Neuropathic pain   . Pneumonia   . Rheumatoid arthritis ([place])   . Sleep apnea   . Stroke ([place])   . Thyroid disease     HOSPITAL COURSE:   84 year old female with past medical history of breast cancer, diabetes, hypertension, history of previous UTI, rheumatoid arthritis, migraines, history of previous CVA, sleep apnea who presented to the hospital due to fever, shortness of breath and noted to have sepsis secondary pneumonia.  1.  Sepsis-patient met criteria admission given her fever, leukocytosis and CT chest findings suggestive of right-sided pneumonia. Patient was admitted to the hospital treated with IV antibiotics with ceftriaxone, Zithromax. - She has clinically improved and is currently afebrile and hemodynamically stable.  Her cultures are negative.  She  will be discharged on oral Levaquin for a few days.  Her lactate level has normalized.  2.  Pneumonia-source of patient's sepsis. -While in the hospital patient was treated with IV ceftriaxone, Zithromax.  Now being discharged on oral Levaquin.  Her blood cultures are negative.  She is afebrile and hemodynamic stable.  Leukocytosis is improved.  Patient's COVID-19 test was negative.  3.  History of breast cancer-patient will continue her Arimidex.  4.  Hypothyroidism- patient will continue her Synthroid.  5.  Diabetes type 2 without complication- patient will continue her metformin.  6.  Diabetic neuropathy-patient will continue gabapentin.  7.  Hyperlipidemia-patient will continue her Crestor.  8.  Essential hypertension-patient will continue her propranolol.  9.  History of glaucoma-patient will continue her timolol, dorzolamide, brimonidine eyedrops.  DISCHARGE CONDITIONS:   Stable  CONSULTS OBTAINED:    DRUG ALLERGIES:  No Known Allergies  DISCHARGE MEDICATIONS:   Allergies as of 08/22/2018   No Known Allergies     Medication List    TAKE these medications   anastrozole 1 MG tablet Commonly known as:  ARIMIDEX Take 1 mg by mouth daily.   aspirin 81 MG tablet Take 81 mg by mouth daily.   blood glucose meter kit and supplies Dispense based on patient and insurance preference. Use up to 2 times daily as directed. (FOR ICD-10 E10.9, E11.9).   brimonidine 0.2 % ophthalmic solution Commonly known as:  ALPHAGAN Place 1 drop into both eyes 3 (three) times daily. Both eyes   cetirizine 10 MG chewable tablet Commonly known as:  ZYRTEC Chew 1  tablet by mouth once daily as needed for allergies.   donepezil 10 MG tablet Commonly known as:  ARICEPT Take 1 tablet daily What changed:    how much to take  how to take this  when to take this  additional instructions   dorzolamide-timolol 22.3-6.8 MG/ML ophthalmic solution Commonly known as:  COSOPT Place 1 drop  into both eyes 2 (two) times daily.   DULoxetine 60 MG capsule Commonly known as:  CYMBALTA Take 1 capsule by mouth once daily for depression.   fluticasone 50 MCG/ACT nasal spray Commonly known as:  FLONASE USE 2 SPRAYS IN EACH NOSTRIL EVERY DAY   gabapentin 800 MG tablet Commonly known as:  NEURONTIN TAKE 1 TABLET BY MOUTH THREE TIMES A DAY   latanoprost 0.005 % ophthalmic solution Commonly known as:  XALATAN Place 1 drop into both eyes at bedtime.   levofloxacin 750 MG tablet Commonly known as:  LEVAQUIN Take 1 tablet (750 mg total) by mouth daily for 5 days.   levothyroxine 125 MCG tablet Commonly known as:  SYNTHROID Take 1 tablet (125 mcg total) by mouth daily before breakfast. Take 1 tablet by mouth every morning on an empty stomach with a full glass of water. No food or other medications for 30 minutes.   meclizine 25 MG tablet Commonly known as:  ANTIVERT Take 1 tablet (25 mg total) by mouth 2 (two) times daily as needed for dizziness.   metFORMIN 500 MG tablet Commonly known as:  GLUCOPHAGE Take 1 tablet by mouth twice daily with food for diabetes.   omeprazole 20 MG capsule Commonly known as:  PRILOSEC Take 20 mg by mouth 2 (two) times a day.   propranolol 80 MG tablet Commonly known as:  INDERAL Take 1 tablet by mouth once daily for blood pressure.   rosuvastatin 10 MG tablet Commonly known as:  Crestor Take 1 tablet by mouth once daily for cholesterol.   SUMAtriptan 25 MG tablet Commonly known as:  IMITREX Take as needed for migraine. Do not take more than twice a week What changed:    how much to take  how to take this  when to take this  reasons to take this  additional instructions   traZODone 100 MG tablet Commonly known as:  DESYREL TAKE 1 TABLET (100 MG TOTAL) BY MOUTH AT BEDTIME AS NEEDED FOR SLEEP.         DISCHARGE INSTRUCTIONS:   DIET:  Cardiac diet and Diabetic diet  DISCHARGE CONDITION:  Stable  ACTIVITY:   Activity as tolerated  OXYGEN:  Home Oxygen: No.   Oxygen Delivery: room air  DISCHARGE LOCATION:  home   If you experience worsening of your admission symptoms, develop shortness of breath, life threatening emergency, suicidal or homicidal thoughts you must seek medical attention immediately by calling 911 or calling your MD immediately  if symptoms less severe.  You Must read complete instructions/literature along with all the possible adverse reactions/side effects for all the Medicines you take and that have been prescribed to you. Take any new Medicines after you have completely understood and accpet all the possible adverse reactions/side effects.   Please note  You were cared for by a hospitalist during your hospital stay. If you have any questions about your discharge medications or the care you received while you were in the hospital after you are discharged, you can call the unit and asked to speak with the hospitalist on call if the hospitalist that took care of you  is not available. Once you are discharged, your primary care physician will handle any further medical issues. Please note that NO REFILLS for any discharge medications will be authorized once you are discharged, as it is imperative that you return to your primary care physician (or establish a relationship with a primary care physician if you do not have one) for your aftercare needs so that they can reassess your need for medications and monitor your lab values.     Today   Feels much better from yesterday.  No shortness of breath, afebrile, no cough, exertional dyspnea.  Will d/c home today.   VITAL SIGNS:  Blood pressure 130/66, pulse 84, temperature 98.2 F (36.8 C), temperature source Oral, resp. rate 16, height '5\' 4"'$  (1.626 m), weight 71.5 kg, SpO2 97 %.  I/O:    Intake/Output Summary (Last 24 hours) at 08/22/2018 1341 Last data filed at 08/22/2018 1151 Gross per 24 hour  Intake 2312.02 ml  Output  1275 ml  Net 1037.02 ml    PHYSICAL EXAMINATION:  GENERAL:  84 y.o.-year-old patient lying in the bed with no acute distress.  EYES: Pupils equal, round, reactive to light and accommodation. No scleral icterus. Extraocular muscles intact.  HEENT: Head atraumatic, normocephalic. Oropharynx and nasopharynx clear.  NECK:  Supple, no jugular venous distention. No thyroid enlargement, no tenderness.  LUNGS: Normal breath sounds bilaterally, no wheezing, rales,rhonchi. No use of accessory muscles of respiration.  CARDIOVASCULAR: S1, S2 normal. No murmurs, rubs, or gallops.  ABDOMEN: Soft, non-tender, non-distended. Bowel sounds present. No organomegaly or mass.  EXTREMITIES: No pedal edema, cyanosis, or clubbing.  NEUROLOGIC: Cranial nerves II through XII are intact. No focal motor or sensory defecits b/l.  PSYCHIATRIC: The patient is alert and oriented x 3.  SKIN: No obvious rash, lesion, or ulcer.   DATA REVIEW:   CBC Recent Labs  Lab 08/22/18 0243  WBC 17.9*  HGB 11.1*  HCT 36.2  PLT 258    Chemistries  Recent Labs  Lab 08/21/18 1836 08/22/18 0243  NA 136 140  K 3.6 3.5  CL 96* 105  CO2 24 25  GLUCOSE 153* 108*  BUN 12 11  CREATININE 0.69 0.67  CALCIUM 9.3 8.7*  AST 23  --   ALT 12  --   ALKPHOS 100  --   BILITOT 0.5  --     Cardiac Enzymes Recent Labs  Lab 08/21/18 1836  TROPONINI <0.03    Microbiology Results  Results for orders placed or performed during the hospital encounter of 08/21/18  Blood culture (routine x 2)     Status: None (Preliminary result)   Collection Time: 08/21/18  8:04 PM  Result Value Ref Range Status   Specimen Description BLOOD BLOOD LEFT FOREARM  Final   Special Requests   Final    BOTTLES DRAWN AEROBIC AND ANAEROBIC Blood Culture adequate volume   Culture   Final    NO GROWTH < 12 HOURS Performed at [facility], [address], [place], [place] [address]    Report Status PENDING  Incomplete  Blood culture (routine  x 2)     Status: None (Preliminary result)   Collection Time: 08/21/18  8:04 PM  Result Value Ref Range Status   Specimen Description BLOOD BLOOD RIGHT FOREARM  Final   Special Requests   Final    BOTTLES DRAWN AEROBIC AND ANAEROBIC Blood Culture results may not be optimal due to an excessive volume of blood received in culture bottles  Culture   Final    NO GROWTH < 12 HOURS Performed at [facility], [address] [place], [place] [address]    Report Status PENDING  Incomplete  SARS Coronavirus 2 [facility] order, Performed in [place] hospital lab)     Status: None   Collection Time: 08/21/18 10:22 PM  Result Value Ref Range Status   SARS Coronavirus 2 NEGATIVE NEGATIVE Final    Comment: (NOTE) If result is NEGATIVE SARS-CoV-2 target nucleic acids are NOT DETECTED. The SARS-CoV-2 RNA is generally detectable in upper and lower  respiratory specimens during the acute phase of infection. The lowest  concentration of SARS-CoV-2 viral copies this assay can detect is 250  copies / mL. A negative result does not preclude SARS-CoV-2 infection  and should not be used as the sole basis for treatment or other  patient management decisions.  A negative result may occur with  improper specimen collection / handling, submission of specimen other  than nasopharyngeal swab, presence of viral mutation(s) within the  areas targeted by this assay, and inadequate number of viral copies  (<250 copies / mL). A negative result must be combined with clinical  observations, patient history, and epidemiological information. If result is POSITIVE SARS-CoV-2 target nucleic acids are DETECTED. The SARS-CoV-2 RNA is generally detectable in upper and lower  respiratory specimens dur ing the acute phase of infection.  Positive  results are indicative of active infection with SARS-CoV-2.  Clinical  correlation with patient history and other diagnostic information is  necessary to determine  patient infection status.  Positive results do  not rule out bacterial infection or co-infection with other viruses. If result is PRESUMPTIVE POSTIVE SARS-CoV-2 nucleic acids MAY BE PRESENT.   A presumptive positive result was obtained on the submitted specimen  and confirmed on repeat testing.  While 2019 novel coronavirus  (SARS-CoV-2) nucleic acids may be present in the submitted sample  additional confirmatory testing may be necessary for epidemiological  and / or clinical management purposes  to differentiate between  SARS-CoV-2 and other Sarbecovirus currently known to infect humans.  If clinically indicated additional testing with an alternate test  methodology [phone]) is advised. The SARS-CoV-2 RNA is generally  detectable in upper and lower respiratory sp ecimens during the acute  phase of infection. The expected result is Negative. Fact Sheet for Patients:  StrictlyIdeas.no Fact Sheet for Healthcare Providers: BankingDealers.co.za This test is not yet approved or cleared by the [place] FDA and has been authorized for detection and/or diagnosis of SARS-CoV-2 by FDA under an Emergency Use Authorization (EUA).  This EUA will remain in effect (meaning this test can be used) for the duration of the COVID-19 declaration under Section 564(b)(1) of the Act, 21 U.S.C. section 360bbb-3(b)(1), unless the authorization is terminated or revoked sooner. Performed at [facility], [address] [place], [place] [address]     RADIOLOGY:  Ct Angio Chest Pe W And/or Wo Contrast  Result Date: 08/21/2018 CLINICAL DATA:  Shortness of breath and weakness. EXAM: CT ANGIOGRAPHY CHEST WITH CONTRAST TECHNIQUE: Multidetector CT imaging of the chest was performed using the standard protocol during bolus administration of intravenous contrast. Multiplanar CT image reconstructions and MIPs were obtained to evaluate the vascular anatomy.  CONTRAST:  48m OMNIPAQUE IOHEXOL 350 MG/ML SOLN COMPARISON:  Chest x-ray from same day. FINDINGS: Cardiovascular: Satisfactory opacification of the pulmonary arteries to the segmental level. No evidence of pulmonary embolism. Mild enlargement of the main pulmonary artery, measuring 3.3  cm in diameter. Normal heart size. No pericardial effusion. No thoracic aortic aneurysm or dissection. Coronary, aortic arch, and branch vessel atherosclerotic vascular disease. Mediastinum/Nodes: Prominent prevascular lymph node measuring 9 mm in short axis. Prominent right hilar lymph node, also measuring 9 mm in short axis. No enlarged axillary lymph nodes. The thyroid gland, trachea, and esophagus demonstrate no significant findings. Small hiatal hernia. Lungs/Pleura: Patchy peribronchovascular opacities in the posterior right upper lobe. 4 mm nodules in the right upper lobe (series 7, image 35) and right middle lobe (series 7, image 52). Calcified granuloma in the posterior right upper lobe. No pleural effusion or pneumothorax. Right greater than left lower lobe dependent atelectasis. Right upper and bilateral lower lobe peribronchial thickening. Upper Abdomen: No acute abnormality. Musculoskeletal: No chest wall abnormality. No acute or significant osseous findings. Prior bilateral total shoulder arthroplasties. Review of the MIP images confirms the above findings. IMPRESSION: 1. Posterior right upper lobe bronchopneumonia. 2. No evidence of pulmonary embolism. Mild main pulmonary artery dilatation, suggestive of pulmonary arterial hypertension. 3. 4 mm nodules in the right upper and middle lobes. No follow-up needed if patient is low-risk (and has no known or suspected primary neoplasm). Non-contrast chest CT can be considered in 12 months if patient is high-risk. This recommendation follows the consensus statement: Guidelines for Management of Incidental Pulmonary Nodules Detected on CT Images: From the Fleischner Society  2017; Radiology 2017; 284:228-243. 4. Aortic atherosclerosis (ICD10-I70.0). Electronically Signed   By: Titus Dubin M.D.   On: 08/21/2018 21:07   Dg Chest Portable 1 View  Result Date: 08/21/2018 CLINICAL DATA:  Cough EXAM: PORTABLE CHEST 1 VIEW COMPARISON:  06/14/2016 chest radiograph FINDINGS: Cardiac silhouette is normal. There is mild diffuse interstitial opacity, unchanged. No pleural effusion or pneumothorax. Bilateral shoulder hemi arthroplasties. IMPRESSION: Mild chronic interstitial opacity, possibly chronic bronchitis. No focal airspace disease. Electronically Signed   By: Ulyses Jarred M.D.   On: 08/21/2018 19:24      Management plans discussed with the patient, family and they are in agreement.  CODE STATUS:     Code Status Orders  (From admission, onward)         Start     Ordered   08/22/18 0024  Full code  Continuous     08/22/18 0023          TOTAL TIME TAKING CARE OF THIS PATIENT: 40 minutes.    Henreitta Leber M.D on 08/22/2018 at 1:41 PM  Between 7am to 6pm - Pager - [phone]  After 6pm go to [email or web address] - Proofreader  [facility]  Office  [phone]  CC: Primary care physician; Pleas Koch, NP

## 2018-08-22 NOTE — Progress Notes (Signed)
Patient discharged home with daughter. Madlyn Frankel, RN

## 2018-08-22 NOTE — Progress Notes (Signed)
Daughter to provide transport home at 1400, confirmed via telephone. Madlyn Frankel, RN

## 2018-08-23 ENCOUNTER — Telehealth: Payer: Self-pay

## 2018-08-23 NOTE — Telephone Encounter (Signed)
Patient discharged 08/22/2018 from [facility] with the following diagnoses:  Principal Problem:   Sepsis ([place]) Active Problems:   HTN (hypertension)   GERD (gastroesophageal reflux disease)   CAP (community acquired pneumonia)   Hypothyroidism   Hyperlipidemia   Type 2 diabetes mellitus with peripheral neuropathy [facility])   Transitional Care Management Follow-up Telephone Call    Date discharged? 08/22/2018  How have you been since you were released from the hospital? [place].   Any patient concerns? Patient reports concerns with sleep management. Patient is taking 200 mg of Trazadone instead of 100 mg as prescribed. Patient states she is awake all night if she takes only one pill.    Items Reviewed:  Medications reviewed: Yes  Allergies reviewed: Yes  Dietary changes reviewed: Yes  Referrals reviewed: N/A   Functional Questionnaire:  Independent - I Dependent - D    Activities of Daily Living (ADLs):    Personal hygiene - I Dressing - I Eating - I Maintaining continence - I report of urine incontinence/wears Depend Transferring - I may use walker as needed  Independent Activities of Daily Living (iADLs): Basic communication skills - I Transportation - D does not drive Meal preparation  - D does not prepare meals Shopping - I Housework - I Managing medications - I  Managing personal finances - I   Confirmed importance and date/time of follow-up visits scheduled YES  Provider Appointment booked with PCP 08/27/18 @ 1440  Confirmed with patient if condition begins to worsen call PCP or go to the ER.  Patient was given the office number and encouraged to call back with question or concerns: YES

## 2018-08-24 NOTE — Telephone Encounter (Signed)
Noted  

## 2018-08-26 LAB — CULTURE, BLOOD (ROUTINE X 2)
Culture: NO GROWTH
Culture: NO GROWTH
Special Requests: ADEQUATE

## 2018-08-27 ENCOUNTER — Ambulatory Visit (INDEPENDENT_AMBULATORY_CARE_PROVIDER_SITE_OTHER): Payer: Medicare HMO | Admitting: Primary Care

## 2018-08-27 ENCOUNTER — Other Ambulatory Visit: Payer: Self-pay | Admitting: *Deleted

## 2018-08-27 ENCOUNTER — Other Ambulatory Visit: Payer: Self-pay

## 2018-08-27 ENCOUNTER — Encounter: Payer: Self-pay | Admitting: Primary Care

## 2018-08-27 DIAGNOSIS — J189 Pneumonia, unspecified organism: Secondary | ICD-10-CM

## 2018-08-27 DIAGNOSIS — G4700 Insomnia, unspecified: Secondary | ICD-10-CM | POA: Diagnosis not present

## 2018-08-27 MED ORDER — ALBUTEROL SULFATE HFA 108 (90 BASE) MCG/ACT IN AERS: 2.0000 | INHALATION_SPRAY | RESPIRATORY_TRACT | 0 refills | Status: DC | PRN

## 2018-08-27 MED ORDER — TRAZODONE HCL 100 MG PO TABS: 200.0000 mg | ORAL_TABLET | Freq: Every evening | ORAL | 1 refills | Status: DC | PRN

## 2018-08-27 NOTE — Patient Instructions (Signed)
Continue Levaquin as prescribed.  Continue Trazodone 200 mg, I will send a new prescription to the pharmacy.  Shortness of Breath/Wheezing/Cough: Use the albuterol inhaler. Inhale 2 puffs into the lungs every 4 to 6 hours as needed for wheezing, cough, and/or shortness of breath.   We will be in touch regarding a lab appointment.  It was a pleasure to see you today!

## 2018-08-27 NOTE — Patient Outreach (Signed)
[name]) Care Management  08/27/2018  Shannon Higgins May 26, 1934 [id number]   Subjective:02/16/2017 Designated Party Release on file for patient's daughters: [name] and [name]. Telephone call to patient's home / mobile number, spoke with patient's daughter [name]), stated patient's name, date of birth, and address.   Discussed [facility] Care Management [employer] EMMI General Discharge Red Flag Alert follow up, daughter voiced understanding, and is in agreement to follow up on patient's behalf.  Daughter states patient is doing well currently resting, she remembers receiving EMMI automated calls for patient, EMMI captured incorrect answer, patient does not have a wound, and is not having any issues with wound healing.  States patient has a tele health / video visit with patient's primary provider this afternoon.  Daughter states she is a Armed forces operational officer and able to assist patient as needed. Daughter states patient does not have any education material, EMMI follow up, care coordination, care management, disease monitoring, transportation, community resource, or pharmacy needs at this time.  States she is very appreciative of the follow up.      Objective: Per KPN (Knowledge Performance Now, point of care tool) and chart review, patient hospitalized 08/21/2018 -08/22/2018 for Sepsis with acute hypoxic respiratory failure without septic shock, due to unspecified organism, CAP (community acquired pneumonia).    Patient also has a history of diabetes, hypertension, hyperlipidemia, Hypothyroidism, Breast cancer, Asthma, Migraine, Neuropathic pain, Rheumatoid arthritis, UTI, and Stroke.       Assessment: Received [facility] EMMI General Discharge Red Flag Alert follow up referral on 08/27/2018.  Red Flag Alert Trigger, Day #1, patient answered no to the following question: Wounds healing well?   EMMI follow up completed and no further care management needs.       Plan:  RNCM will complete case closure due to follow up completed / no care management needs.       Renley Banwart H. Annia Friendly, BSN, [facility] Telephonic CM Phone: [phone] Fax: [phone]

## 2018-08-27 NOTE — Progress Notes (Signed)
Subjective:    Patient ID: Shannon Higgins, female    DOB: Jul 22, 1934, 84 y.o.   MRN: [id number]  HPI  Virtual Visit via Video Note  I connected with YEHUDIT FULGINITI on 08/27/18 at  2:40 PM EDT by a video enabled telemedicine application and verified that I am speaking with the correct person using two identifiers.   I discussed the limitations of evaluation and management by telemedicine and the availability of in person appointments. The patient expressed understanding and agreed to proceed. She is at home, I am in the office.  History of Present Illness:  Shannon Higgins is a 84 year old female who presents today for [facility] Follow up.  She presented to [facility] ED on 08/21/18 with a chief complaint of altered mental status and weakness, also with cough and fevers of 101. Work up in the emergency department (lactic acid, vitals, CT chest, CBC) was consistent for right side pneumonia and sepsis so she was admitted for further treatment.  During her hospital stay she was treated with IV antibiotics and IV fluids. She tested negative for Covid-19. Throughout her stay her symptoms improved and she became hemodynamically stable. She was discharged home on 08/22/18 with oral Levaquin and recommendations for PCP follow up.   Since her discharge home she's doing better. Her cough is productive with green mucous, some breakthrough shortness of breath. She is compliant to her Levaquin and has one dose remaining. She denies fevers. Her main concern is her chronic insomnia. She does much better with Trazodone 200 mg and would like a new prescription for this.    Observations/Objective:  Appears well. Alert and oriented. No respiratory distress or cough noted.  Assessment and Plan:  Admitted and treated for right sided community acquired pneumonia and sepsis. Overall doing better. Appears well today. Repeat labs pending. Continue Levaquin as prescribed. Rx for albuterol inhaler provided to use  PRN. Follow up PRN.  Hospital labs, imaging, notes reviewed. Allie Bossier, NP-C   Follow Up Instructions:  Continue Levaquin as prescribed.  Continue Trazodone 200 mg, I will send a new prescription to the pharmacy.  Shortness of Breath/Wheezing/Cough: Use the albuterol inhaler. Inhale 2 puffs into the lungs every 4 to 6 hours as needed for wheezing, cough, and/or shortness of breath.   We will be in touch regarding a lab appointment.  It was a pleasure to see you today!    I discussed the assessment and treatment plan with the patient. The patient was provided an opportunity to ask questions and all were answered. The patient agreed with the plan and demonstrated an understanding of the instructions.   The patient was advised to call back or seek an in-person evaluation if the symptoms worsen or if the condition fails to improve as anticipated.     Pleas Koch, NP    Review of Systems  Constitutional: Negative for chills and fever.  HENT: Positive for congestion.   Eyes: Negative for visual disturbance.  Respiratory: Positive for cough and shortness of breath.   Cardiovascular: Negative for chest pain.  Neurological: Negative for dizziness and headaches.       Past Medical History:  Diagnosis Date  . Asthma   . Breast cancer ([place])   . Chronic sinusitis   . Diabetes mellitus without complication ([place])   . GERD (gastroesophageal reflux disease)   . History of recurrent UTIs   . Hypertension   . Insomnia   . Migraine   . Neuropathic  pain   . Pneumonia   . Rheumatoid arthritis ([place])   . Sleep apnea   . Stroke ([place])   . Thyroid disease      Social History   Socioeconomic History  . Marital status: Widowed    Spouse name: Not on file  . Number of children: Not on file  . Years of education: Not on file  . Highest education level: Not on file  Occupational History  . Not on file  Social Needs  . Financial resource strain: Not on file  . Food  insecurity:    Worry: Not on file    Inability: Not on file  . Transportation needs:    Medical: Not on file    Non-medical: Not on file  Tobacco Use  . Smoking status: Never Smoker  . Smokeless tobacco: Never Used  Substance and Sexual Activity  . Alcohol use: No  . Drug use: No  . Sexual activity: Not on file  Lifestyle  . Physical activity:    Days per week: Not on file    Minutes per session: Not on file  . Stress: Not on file  Relationships  . Social connections:    Talks on phone: Not on file    Gets together: Not on file    Attends religious service: Not on file    Active member of club or organization: Not on file    Attends meetings of clubs or organizations: Not on file    Relationship status: Not on file  . Intimate partner violence:    Fear of current or ex partner: Not on file    Emotionally abused: Not on file    Physically abused: Not on file    Forced sexual activity: Not on file  Other Topics Concern  . Not on file  Social History Narrative   Pt lives in 1 story home with her daughter, Shannon Higgins and [name]'s husband   Has 2 adult daughters   Highest level of education: some college   Worked mainly as Web designer.    Past Surgical History:  Procedure Laterality Date  . ABDOMINAL HYSTERECTOMY    . BACK SURGERY    . BREAST LUMPECTOMY    . CHOLECYSTECTOMY    . KNEE SURGERY    . SHOULDER SURGERY      Family History  Problem Relation Age of Onset  . Diabetes Daughter   . Hypertension Daughter   . Fibromyalgia Daughter   . GER disease Daughter   . Fibromyalgia Daughter   . Crohn's disease Daughter   . Asthma Daughter   . Hypertension Mother     No Known Allergies  Current Outpatient Medications on File Prior to Visit  Medication Sig Dispense Refill  . anastrozole (ARIMIDEX) 1 MG tablet Take 1 mg by mouth daily.    [name] aspirin 81 MG tablet Take 81 mg by mouth daily.    . blood glucose meter kit and supplies Dispense based on patient and  insurance preference. Use up to 2 times daily as directed. (FOR ICD-10 E10.9, E11.9). 1 each 0  . brimonidine (ALPHAGAN) 0.2 % ophthalmic solution Place 1 drop into both eyes 3 (three) times daily. Both eyes     . cetirizine (ZYRTEC) 10 MG chewable tablet Chew 1 tablet by mouth once daily as needed for allergies. 90 tablet 0  . donepezil (ARICEPT) 10 MG tablet Take 1 tablet daily (Patient taking differently: Take 10 mg by mouth daily. ) 90 tablet 3  .  dorzolamide-timolol (COSOPT) 22.3-6.8 MG/ML ophthalmic solution Place 1 drop into both eyes 2 (two) times daily.    . DULoxetine (CYMBALTA) 60 MG capsule Take 1 capsule by mouth once daily for depression. 90 capsule 3  . fluticasone (FLONASE) 50 MCG/ACT nasal spray USE 2 SPRAYS IN EACH NOSTRIL EVERY DAY 48 g 3  . gabapentin (NEURONTIN) 800 MG tablet TAKE 1 TABLET BY MOUTH THREE TIMES A DAY 270 tablet 1  . latanoprost (XALATAN) 0.005 % ophthalmic solution Place 1 drop into both eyes at bedtime.    [name] levofloxacin (LEVAQUIN) 750 MG tablet Take 1 tablet (750 mg total) by mouth daily for 5 days. 5 tablet 0  . levothyroxine (SYNTHROID, LEVOTHROID) 125 MCG tablet Take 1 tablet (125 mcg total) by mouth daily before breakfast. Take 1 tablet by mouth every morning on an empty stomach with a full glass of water. No food or other medications for 30 minutes. 90 tablet 2  . meclizine (ANTIVERT) 25 MG tablet Take 1 tablet (25 mg total) by mouth 2 (two) times daily as needed for dizziness. 180 tablet 0  . metFORMIN (GLUCOPHAGE) 500 MG tablet Take 1 tablet by mouth twice daily with food for diabetes. 180 tablet 3  . omeprazole (PRILOSEC) 20 MG capsule Take 20 mg by mouth 2 (two) times a day.    . propranolol (INDERAL) 80 MG tablet Take 1 tablet by mouth once daily for blood pressure. 90 tablet 3  . rosuvastatin (CRESTOR) 10 MG tablet Take 1 tablet by mouth once daily for cholesterol. 90 tablet 3  . SUMAtriptan (IMITREX) 25 MG tablet Take as needed for migraine. Do not  take more than twice a week (Patient taking differently: Take 25 mg by mouth daily as needed for migraine. Do not take more than twice a week) 24 tablet 3   No current facility-administered medications on file prior to visit.     There were no vitals taken for this visit.   Objective:   Physical Exam  Constitutional: She is oriented to person, place, and time. She appears well-nourished.  Respiratory: Effort normal. No respiratory distress.  Neurological: She is alert and oriented to person, place, and time.  Psychiatric: She has a normal mood and affect.           Assessment & Plan:

## 2018-08-27 NOTE — Assessment & Plan Note (Signed)
Admitted and treated for right sided community acquired pneumonia and sepsis. Overall doing better. Appears well today. Repeat labs pending. Continue Levaquin as prescribed. Rx for albuterol inhaler provided to use PRN. Follow up PRN.

## 2018-08-29 NOTE — Telephone Encounter (Signed)
Called to schedule labs. Lvm asking pt's daughter to call office.

## 2018-08-29 NOTE — Telephone Encounter (Signed)
Shannon Higgins, this patient needs a non fasting lab only appointment. Thanks!

## 2018-08-30 ENCOUNTER — Other Ambulatory Visit: Payer: Self-pay | Admitting: Primary Care

## 2018-08-31 ENCOUNTER — Other Ambulatory Visit (INDEPENDENT_AMBULATORY_CARE_PROVIDER_SITE_OTHER): Payer: Medicare HMO

## 2018-08-31 ENCOUNTER — Other Ambulatory Visit: Payer: Self-pay

## 2018-08-31 DIAGNOSIS — J189 Pneumonia, unspecified organism: Secondary | ICD-10-CM

## 2018-09-01 LAB — CBC
HCT: 38.7 % (ref 35.0–45.0)
Hemoglobin: 12.6 g/dL (ref 11.7–15.5)
MCH: 27.7 pg (ref 27.0–33.0)
MCHC: 32.6 g/dL (ref 32.0–36.0)
MCV: 85.1 fL (ref 80.0–100.0)
MPV: 11.1 fL (ref 7.5–12.5)
Platelets: 363 10*3/uL (ref 140–400)
RBC: 4.55 10*6/uL (ref 3.80–5.10)
RDW: 13.6 % (ref 11.0–15.0)
WBC: 11 10*3/uL — ABNORMAL HIGH (ref 3.8–10.8)

## 2018-09-13 ENCOUNTER — Observation Stay
Admission: EM | Admit: 2018-09-13 | Discharge: 2018-09-14 | Disposition: A | Discharge status: Home / Self Care | Payer: Medicare HMO | Attending: Internal Medicine | Admitting: Internal Medicine

## 2018-09-13 ENCOUNTER — Encounter: Payer: Self-pay | Admitting: Emergency Medicine

## 2018-09-13 ENCOUNTER — Observation Stay: Payer: Medicare HMO

## 2018-09-13 ENCOUNTER — Emergency Department: Payer: Medicare HMO

## 2018-09-13 ENCOUNTER — Other Ambulatory Visit: Payer: Self-pay

## 2018-09-13 DIAGNOSIS — Z7984 Long term (current) use of oral hypoglycemic drugs: Secondary | ICD-10-CM | POA: Insufficient documentation

## 2018-09-13 DIAGNOSIS — E039 Hypothyroidism, unspecified: Secondary | ICD-10-CM | POA: Diagnosis not present

## 2018-09-13 DIAGNOSIS — G459 Transient cerebral ischemic attack, unspecified: Secondary | ICD-10-CM | POA: Diagnosis not present

## 2018-09-13 DIAGNOSIS — R200 Anesthesia of skin: Secondary | ICD-10-CM | POA: Diagnosis not present

## 2018-09-13 DIAGNOSIS — J45909 Unspecified asthma, uncomplicated: Secondary | ICD-10-CM | POA: Insufficient documentation

## 2018-09-13 DIAGNOSIS — I10 Essential (primary) hypertension: Secondary | ICD-10-CM | POA: Diagnosis not present

## 2018-09-13 DIAGNOSIS — Z8673 Personal history of transient ischemic attack (TIA), and cerebral infarction without residual deficits: Secondary | ICD-10-CM | POA: Diagnosis present

## 2018-09-13 DIAGNOSIS — Z79890 Hormone replacement therapy (postmenopausal): Secondary | ICD-10-CM | POA: Diagnosis not present

## 2018-09-13 DIAGNOSIS — I6521 Occlusion and stenosis of right carotid artery: Secondary | ICD-10-CM | POA: Diagnosis not present

## 2018-09-13 DIAGNOSIS — F0390 Unspecified dementia without behavioral disturbance: Secondary | ICD-10-CM | POA: Diagnosis not present

## 2018-09-13 DIAGNOSIS — Z20828 Contact with and (suspected) exposure to other viral communicable diseases: Secondary | ICD-10-CM | POA: Diagnosis not present

## 2018-09-13 DIAGNOSIS — M069 Rheumatoid arthritis, unspecified: Secondary | ICD-10-CM | POA: Insufficient documentation

## 2018-09-13 DIAGNOSIS — Z1159 Encounter for screening for other viral diseases: Secondary | ICD-10-CM | POA: Diagnosis not present

## 2018-09-13 DIAGNOSIS — Z7951 Long term (current) use of inhaled steroids: Secondary | ICD-10-CM | POA: Diagnosis not present

## 2018-09-13 DIAGNOSIS — Z7982 Long term (current) use of aspirin: Secondary | ICD-10-CM | POA: Insufficient documentation

## 2018-09-13 DIAGNOSIS — F0150 Vascular dementia without behavioral disturbance: Secondary | ICD-10-CM | POA: Diagnosis not present

## 2018-09-13 DIAGNOSIS — Z853 Personal history of malignant neoplasm of breast: Secondary | ICD-10-CM | POA: Diagnosis not present

## 2018-09-13 DIAGNOSIS — K219 Gastro-esophageal reflux disease without esophagitis: Secondary | ICD-10-CM | POA: Diagnosis not present

## 2018-09-13 DIAGNOSIS — Z79899 Other long term (current) drug therapy: Secondary | ICD-10-CM | POA: Insufficient documentation

## 2018-09-13 DIAGNOSIS — R2681 Unsteadiness on feet: Secondary | ICD-10-CM | POA: Diagnosis not present

## 2018-09-13 DIAGNOSIS — G4700 Insomnia, unspecified: Secondary | ICD-10-CM | POA: Diagnosis not present

## 2018-09-13 DIAGNOSIS — E119 Type 2 diabetes mellitus without complications: Secondary | ICD-10-CM | POA: Diagnosis not present

## 2018-09-13 DIAGNOSIS — R531 Weakness: Secondary | ICD-10-CM | POA: Diagnosis not present

## 2018-09-13 DIAGNOSIS — E785 Hyperlipidemia, unspecified: Secondary | ICD-10-CM | POA: Diagnosis not present

## 2018-09-13 DIAGNOSIS — M6281 Muscle weakness (generalized): Secondary | ICD-10-CM | POA: Insufficient documentation

## 2018-09-13 DIAGNOSIS — R29810 Facial weakness: Secondary | ICD-10-CM | POA: Diagnosis not present

## 2018-09-13 LAB — COMPREHENSIVE METABOLIC PANEL
ALT: 12 U/L (ref 0–44)
AST: 20 U/L (ref 15–41)
Albumin: 3.9 g/dL (ref 3.5–5.0)
Alkaline Phosphatase: 89 U/L (ref 38–126)
Anion gap: 9 (ref 5–15)
BUN: 14 mg/dL (ref 8–23)
CO2: 26 mmol/L (ref 22–32)
Calcium: 9.1 mg/dL (ref 8.9–10.3)
Chloride: 104 mmol/L (ref 98–111)
Creatinine, Ser: 0.79 mg/dL (ref 0.44–1.00)
GFR calc Af Amer: >60 mL/min (ref >60)
GFR calc non Af Amer: >60 mL/min (ref >60)
Glucose, Bld: 97 mg/dL (ref 70–99)
Potassium: 3.9 mmol/L (ref 3.5–5.1)
Sodium: 139 mmol/L (ref 135–145)
Total Bilirubin: 0.3 mg/dL (ref 0.3–1.2)
Total Protein: 6.9 g/dL (ref 6.5–8.1)

## 2018-09-13 LAB — DIFFERENTIAL
Abs Immature Granulocytes: 0.02 10*3/uL (ref 0.00–0.07)
Basophils Absolute: 0 10*3/uL (ref 0.0–0.1)
Basophils Relative: 1 %
Eosinophils Absolute: 0.3 10*3/uL (ref 0.0–0.5)
Eosinophils Relative: 4 %
Immature Granulocytes: 0 %
Lymphocytes Relative: 33 %
Lymphs Abs: 2.6 10*3/uL (ref 0.7–4.0)
Monocytes Absolute: 1 10*3/uL (ref 0.1–1.0)
Monocytes Relative: 13 %
Neutro Abs: 3.8 10*3/uL (ref 1.7–7.7)
Neutrophils Relative %: 49 %

## 2018-09-13 LAB — PROTIME-INR
INR: 1 (ref 0.8–1.2)
Prothrombin Time: 13 seconds (ref 11.4–15.2)

## 2018-09-13 LAB — CBC
HCT: 35.6 % — ABNORMAL LOW (ref 36.0–46.0)
Hemoglobin: 11 g/dL — ABNORMAL LOW (ref 12.0–15.0)
MCH: 26.8 pg (ref 26.0–34.0)
MCHC: 30.9 g/dL (ref 30.0–36.0)
MCV: 86.8 fL (ref 80.0–100.0)
Platelets: 285 10*3/uL (ref 150–400)
RBC: 4.1 MIL/uL (ref 3.87–5.11)
RDW: 14 % (ref 11.5–15.5)
WBC: 7.7 10*3/uL (ref 4.0–10.5)
nRBC: 0 % (ref 0.0–0.2)

## 2018-09-13 LAB — SARS CORONAVIRUS 2 BY RT PCR (HOSPITAL ORDER, PERFORMED IN ~~LOC~~ HOSPITAL LAB): SARS Coronavirus 2: NEGATIVE

## 2018-09-13 LAB — APTT: aPTT: 28 seconds (ref 24–36)

## 2018-09-13 LAB — GLUCOSE, CAPILLARY
Glucose-Capillary: 105 mg/dL — ABNORMAL HIGH (ref 70–99)
Glucose-Capillary: 84 mg/dL (ref 70–99)
Glucose-Capillary: 159 mg/dL — ABNORMAL HIGH (ref 70–99)

## 2018-09-13 MED ORDER — LATANOPROST 0.005 % OP SOLN
1.0000 [drp] | Freq: Every day | OPHTHALMIC | Status: DC
  Administered 2018-09-13: 23:00:00 1 [drp] via OPHTHALMIC
  Filled 2018-09-13: qty 2.5

## 2018-09-13 MED ORDER — ANASTROZOLE 1 MG PO TABS
1.0000 mg | ORAL_TABLET | Freq: Every day | ORAL | Status: DC
  Administered 2018-09-14: 10:00:00 1 mg via ORAL
  Filled 2018-09-13: qty 1

## 2018-09-13 MED ORDER — ASPIRIN 81 MG PO CHEW
324.0000 mg | CHEWABLE_TABLET | Freq: Once | ORAL | Status: AC
  Administered 2018-09-13: 19:00:00 324 mg via ORAL
  Filled 2018-09-13: qty 4

## 2018-09-13 MED ORDER — PANTOPRAZOLE SODIUM 40 MG PO TBEC
40.0000 mg | DELAYED_RELEASE_TABLET | Freq: Every day | ORAL | Status: DC
  Administered 2018-09-14: 10:00:00 40 mg via ORAL
  Filled 2018-09-13: qty 1

## 2018-09-13 MED ORDER — PROPRANOLOL HCL 40 MG PO TABS
80.0000 mg | ORAL_TABLET | Freq: Every day | ORAL | Status: DC
  Administered 2018-09-14: 80 mg via ORAL
  Filled 2018-09-13: qty 2

## 2018-09-13 MED ORDER — DONEPEZIL HCL 5 MG PO TABS
10.0000 mg | ORAL_TABLET | Freq: Every day | ORAL | Status: DC
  Administered 2018-09-14: 10:00:00 10 mg via ORAL
  Filled 2018-09-13: qty 2

## 2018-09-13 MED ORDER — SODIUM CHLORIDE 0.9% FLUSH: 3.0000 mL | Freq: Once | INTRAVENOUS | Status: DC

## 2018-09-13 MED ORDER — INSULIN ASPART 100 UNIT/ML ~~LOC~~ SOLN: 0.0000 [IU] | Freq: Every day | SUBCUTANEOUS | Status: DC

## 2018-09-13 MED ORDER — DULOXETINE HCL 30 MG PO CPEP
60.0000 mg | ORAL_CAPSULE | Freq: Every day | ORAL | Status: DC
  Administered 2018-09-14: 10:00:00 60 mg via ORAL
  Filled 2018-09-13: qty 2

## 2018-09-13 MED ORDER — MECLIZINE HCL 25 MG PO TABS
25.0000 mg | ORAL_TABLET | Freq: Two times a day (BID) | ORAL | Status: DC | PRN
  Filled 2018-09-13: qty 1

## 2018-09-13 MED ORDER — ACETAMINOPHEN 160 MG/5ML PO SOLN
650.0000 mg | ORAL | Status: DC | PRN
  Filled 2018-09-13: qty 20.3

## 2018-09-13 MED ORDER — ASPIRIN 325 MG PO TABS
325.0000 mg | ORAL_TABLET | Freq: Every day | ORAL | Status: DC
  Administered 2018-09-14: 10:00:00 325 mg via ORAL
  Filled 2018-09-13: qty 1

## 2018-09-13 MED ORDER — ROSUVASTATIN CALCIUM 10 MG PO TABS
10.0000 mg | ORAL_TABLET | Freq: Every day | ORAL | Status: DC
  Administered 2018-09-14: 10 mg via ORAL
  Filled 2018-09-13: qty 1

## 2018-09-13 MED ORDER — DORZOLAMIDE HCL-TIMOLOL MAL 2-0.5 % OP SOLN
1.0000 [drp] | Freq: Two times a day (BID) | OPHTHALMIC | Status: DC
  Administered 2018-09-13 – 2018-09-14 (×2): 1 [drp] via OPHTHALMIC
  Filled 2018-09-13: qty 10

## 2018-09-13 MED ORDER — METFORMIN HCL 500 MG PO TABS
500.0000 mg | ORAL_TABLET | Freq: Two times a day (BID) | ORAL | Status: DC
  Administered 2018-09-14 (×2): 500 mg via ORAL
  Filled 2018-09-13 (×2): qty 1

## 2018-09-13 MED ORDER — LORATADINE 10 MG PO TABS: 10.0000 mg | ORAL_TABLET | Freq: Every day | ORAL | Status: DC | PRN

## 2018-09-13 MED ORDER — STROKE: EARLY STAGES OF RECOVERY BOOK
Freq: Once | Status: AC
  Administered 2018-09-13: 23:00:00

## 2018-09-13 MED ORDER — FLUTICASONE PROPIONATE 50 MCG/ACT NA SUSP
2.0000 | Freq: Every day | NASAL | Status: DC | PRN
  Filled 2018-09-13: qty 16

## 2018-09-13 MED ORDER — ACETAMINOPHEN 325 MG PO TABS: 650.0000 mg | ORAL_TABLET | ORAL | Status: DC | PRN

## 2018-09-13 MED ORDER — SUMATRIPTAN SUCCINATE 50 MG PO TABS
25.0000 mg | ORAL_TABLET | Freq: Every day | ORAL | Status: DC | PRN
  Filled 2018-09-13: qty 1

## 2018-09-13 MED ORDER — ENOXAPARIN SODIUM 40 MG/0.4ML ~~LOC~~ SOLN
40.0000 mg | SUBCUTANEOUS | Status: DC
  Administered 2018-09-13: 40 mg via SUBCUTANEOUS
  Filled 2018-09-13: qty 0.4

## 2018-09-13 MED ORDER — LEVOTHYROXINE SODIUM 25 MCG PO TABS
125.0000 ug | ORAL_TABLET | Freq: Every day | ORAL | Status: DC
  Administered 2018-09-14: 07:00:00 125 ug via ORAL
  Filled 2018-09-13: qty 1

## 2018-09-13 MED ORDER — ASPIRIN 300 MG RE SUPP: 300.0000 mg | Freq: Every day | RECTAL | Status: DC

## 2018-09-13 MED ORDER — INSULIN ASPART 100 UNIT/ML ~~LOC~~ SOLN
0.0000 [IU] | Freq: Three times a day (TID) | SUBCUTANEOUS | Status: DC
  Administered 2018-09-14: 2 [IU] via SUBCUTANEOUS
  Filled 2018-09-13: qty 1

## 2018-09-13 MED ORDER — ACETAMINOPHEN 650 MG RE SUPP: 650.0000 mg | RECTAL | Status: DC | PRN

## 2018-09-13 MED ORDER — TRAZODONE HCL 50 MG PO TABS: 200.0000 mg | ORAL_TABLET | Freq: Every evening | ORAL | Status: DC | PRN

## 2018-09-13 MED ORDER — BRIMONIDINE TARTRATE 0.2 % OP SOLN
1.0000 [drp] | Freq: Three times a day (TID) | OPHTHALMIC | Status: DC
  Administered 2018-09-13 – 2018-09-14 (×3): 1 [drp] via OPHTHALMIC
  Filled 2018-09-13: qty 5

## 2018-09-13 MED ORDER — ALBUTEROL SULFATE (2.5 MG/3ML) 0.083% IN NEBU: 3.0000 mL | INHALATION_SOLUTION | RESPIRATORY_TRACT | Status: DC | PRN

## 2018-09-13 NOTE — ED Notes (Signed)
Patient transported to CT 

## 2018-09-13 NOTE — H&P (Signed)
[facility] [place] NAME: Shannon Higgins    MR#:  [id number]  DATE OF BIRTH:  [birth year]  DATE OF ADMISSION:  09/13/2018  PRIMARY CARE PHYSICIAN: Pleas Koch, NP   REQUESTING/REFERRING PHYSICIAN:   CHIEF COMPLAINT:   Chief Complaint  Patient presents with  . Facial Droop    HISTORY OF PRESENT ILLNESS: Shannon Higgins  is a 84 y.o. female with a known history per below which includes vascular dementia, history of CVAs, presenting from home with acute facial droop that started around 2:30 PM, patient had complained of bilateral leg weakness, mild headache, confusion per family, right-sided numbness, and emergency room work-up was unimpressive, patient evaluated by telemetry neurology-recommended inpatient admission for MRI/further evaluation, patient is not a candidate for TPA as symptoms have largely resolved, patient evaluated in the emergency room, patient is poor historian due to dementia, patient not been on it for acute facial droop and confusion with paresthesias concerning for TIA.  PAST MEDICAL HISTORY:   Past Medical History:  Diagnosis Date  . Asthma   . Breast cancer ([place])   . Chronic sinusitis   . Diabetes mellitus without complication ([place])   . GERD (gastroesophageal reflux disease)   . History of recurrent UTIs   . Hypertension   . Insomnia   . Migraine   . Neuropathic pain   . Pneumonia   . Rheumatoid arthritis ([place])   . Sleep apnea   . Stroke ([place])   . Thyroid disease     PAST SURGICAL HISTORY:  Past Surgical History:  Procedure Laterality Date  . ABDOMINAL HYSTERECTOMY    . BACK SURGERY    . BREAST LUMPECTOMY    . CHOLECYSTECTOMY    . KNEE SURGERY    . SHOULDER SURGERY      SOCIAL HISTORY:  Social History   Tobacco Use  . Smoking status: Never Smoker  . Smokeless tobacco: Never Used  Substance Use Topics  . Alcohol use: No    FAMILY HISTORY:  Family History  Problem Relation Age of Onset  .  Diabetes Daughter   . Hypertension Daughter   . Fibromyalgia Daughter   . GER disease Daughter   . Fibromyalgia Daughter   . Crohn's disease Daughter   . Asthma Daughter   . Hypertension Mother     DRUG ALLERGIES: No Known Allergies  REVIEW OF SYSTEMS: Unable to be obtained given dementia  CONSTITUTIONAL: No fever, fatigue or weakness.  EYES: No blurred or double vision.  EARS, NOSE, AND THROAT: No tinnitus or ear pain.  RESPIRATORY: No cough, shortness of breath, wheezing or hemoptysis.  CARDIOVASCULAR: No chest pain, orthopnea, edema.  GASTROINTESTINAL: No nausea, vomiting, diarrhea or abdominal pain.  GENITOURINARY: No dysuria, hematuria.  ENDOCRINE: No polyuria, nocturia,  HEMATOLOGY: No anemia, easy bruising or bleeding SKIN: No rash or lesion. MUSCULOSKELETAL: No joint pain or arthritis.   NEUROLOGIC: No tingling, numbness, weakness.  PSYCHIATRY: No anxiety or depression.   MEDICATIONS AT HOME:  Prior to Admission medications   Medication Sig Start Date End Date Taking? Authorizing Provider  anastrozole (ARIMIDEX) 1 MG tablet Take 1 mg by mouth daily.   Yes [provider]  aspirin 81 MG tablet Take 81 mg by mouth daily.   Yes [provider]  brimonidine (ALPHAGAN) 0.2 % ophthalmic solution Place 1 drop into both eyes 3 (three) times daily. Both eyes    Yes [provider]  donepezil (ARICEPT) 10 MG tablet Take  1 tablet daily Patient taking differently: Take 10 mg by mouth daily.  02/23/18  Yes Cameron Sprang, MD  dorzolamide-timolol (COSOPT) 22.3-6.8 MG/ML ophthalmic solution Place 1 drop into both eyes 2 (two) times daily.   Yes [provider]  DULoxetine (CYMBALTA) 60 MG capsule Take 1 capsule by mouth once daily for depression. 12/26/17  Yes Pleas Koch, NP  gabapentin (NEURONTIN) 800 MG tablet TAKE 1 TABLET BY MOUTH THREE TIMES A DAY 06/13/18  Yes Pleas Koch, NP  latanoprost (XALATAN) 0.005 % ophthalmic solution  Place 1 drop into both eyes at bedtime.   Yes [provider]  levothyroxine (SYNTHROID, LEVOTHROID) 125 MCG tablet Take 1 tablet (125 mcg total) by mouth daily before breakfast. Take 1 tablet by mouth every morning on an empty stomach with a full glass of water. No food or other medications for 30 minutes. 07/04/18  Yes Pleas Koch, NP  metFORMIN (GLUCOPHAGE) 500 MG tablet Take 1 tablet by mouth twice daily with food for diabetes. 12/26/17  Yes Pleas Koch, NP  omeprazole (PRILOSEC) 20 MG capsule Take 20 mg by mouth 2 (two) times a day. 07/05/18  Yes [provider]  propranolol (INDERAL) 80 MG tablet Take 1 tablet by mouth once daily for blood pressure. 12/26/17  Yes Pleas Koch, NP  rosuvastatin (CRESTOR) 10 MG tablet Take 1 tablet by mouth once daily for cholesterol. 12/27/17  Yes Pleas Koch, NP  albuterol (VENTOLIN HFA) 108 (90 Base) MCG/ACT inhaler Inhale 2 puffs into the lungs every 4 (four) hours as needed for shortness of breath. 08/27/18   Pleas Koch, NP  blood glucose meter kit and supplies Dispense based on patient and insurance preference. Use up to 2 times daily as directed. (FOR ICD-10 E10.9, E11.9). 07/26/17   Pleas Koch, NP  cetirizine (ZYRTEC) 10 MG chewable tablet Chew 1 tablet by mouth once daily as needed for allergies. 12/26/17   Pleas Koch, NP  fluticasone [facility]) 50 MCG/ACT nasal spray USE 2 SPRAYS IN [facility] NOSTRIL EVERY DAY 08/17/18   Pleas Koch, NP  meclizine (ANTIVERT) 25 MG tablet Take 1 tablet (25 mg total) by mouth 2 (two) times daily as needed for dizziness. 06/21/17   Pleas Koch, NP  SUMAtriptan (IMITREX) 25 MG tablet Take as needed for migraine. Do not take more than twice a week Patient taking differently: Take 25 mg by mouth daily as needed for migraine. Do not take more than twice a week 02/23/18   Cameron Sprang, MD  traZODone (DESYREL) 100 MG tablet Take 2 tablets (200 mg total) by mouth at  bedtime as needed for sleep. 08/27/18   Pleas Koch, NP      PHYSICAL EXAMINATION:   VITAL SIGNS: Blood pressure (!) 148/62, pulse 88, temperature 97.6 F (36.4 C), temperature source Oral, resp. rate 16, SpO2 98 %.  GENERAL:  84 y.o.-year-old patient lying in the bed with no acute distress.  Frail-appearing EYES: Pupils equal, round, reactive to light and accommodation. No scleral icterus. Extraocular muscles intact.  HEENT: Head atraumatic, normocephalic. Oropharynx and nasopharynx clear.  NECK:  Supple, no jugular venous distention. No thyroid enlargement, no tenderness.  LUNGS: Normal breath sounds bilaterally, no wheezing, rales,rhonchi or crepitation. No use of accessory muscles of respiration.  CARDIOVASCULAR: S1, S2 normal. No murmurs, rubs, or gallops.  ABDOMEN: Soft, nontender, nondistended. Bowel sounds present. No organomegaly or mass.  EXTREMITIES: No pedal edema, cyanosis, or clubbing.  NEUROLOGIC:  Cranial nerves II through XII are intact. Muscle strength 5/5 in all extremities. Sensation intact. Gait not checked.  PSYCHIATRIC: The patient is alert, awake, oriented x2, mild confusion noted   SKIN: No obvious rash, lesion, or ulcer.   LABORATORY PANEL:   CBC Recent Labs  Lab 09/13/18 1654  WBC 7.7  HGB 11.0*  HCT 35.6*  PLT 285  MCV 86.8  MCH 26.8  MCHC 30.9  RDW 14.0  LYMPHSABS 2.6  MONOABS 1.0  EOSABS 0.3  BASOSABS 0.0   ------------------------------------------------------------------------------------------------------------------  Chemistries  Recent Labs  Lab 09/13/18 1654  NA 139  K 3.9  CL 104  CO2 26  GLUCOSE 97  BUN 14  CREATININE 0.79  CALCIUM 9.1  AST 20  ALT 12  ALKPHOS 89  BILITOT 0.3   ------------------------------------------------------------------------------------------------------------------ CrCl cannot be calculated (Unknown ideal  weight.). ------------------------------------------------------------------------------------------------------------------ No results for input(s): TSH, T4TOTAL, T3FREE, THYROIDAB in the last 72 hours.  Invalid input(s): FREET3   Coagulation profile Recent Labs  Lab 09/13/18 1654  INR 1.0   ------------------------------------------------------------------------------------------------------------------- No results for input(s): DDIMER in the last 72 hours. -------------------------------------------------------------------------------------------------------------------  Cardiac Enzymes No results for input(s): CKMB, TROPONINI, MYOGLOBIN in the last 168 hours.  Invalid input(s): CK ------------------------------------------------------------------------------------------------------------------ Invalid input(s): POCBNP  ---------------------------------------------------------------------------------------------------------------  Urinalysis    Component Value Date/Time   COLORURINE YELLOW (A) 08/21/2018 1836   APPEARANCEUR CLEAR (A) 08/21/2018 1836   APPEARANCEUR Clear 07/02/2018 1519   LABSPEC 1.016 08/21/2018 1836   PHURINE 8.0 08/21/2018 1836   GLUCOSEU NEGATIVE 08/21/2018 1836   HGBUR NEGATIVE 08/21/2018 1836   BILIRUBINUR NEGATIVE 08/21/2018 1836   BILIRUBINUR Negative 07/02/2018 1519   KETONESUR NEGATIVE 08/21/2018 1836   PROTEINUR 30 (A) 08/21/2018 1836   UROBILINOGEN 0.2 06/20/2018 1538   UROBILINOGEN 0.2 04/26/2012 1547   NITRITE NEGATIVE 08/21/2018 1836   LEUKOCYTESUR NEGATIVE 08/21/2018 1836     RADIOLOGY: Ct Head Code Stroke Wo Contrast  Result Date: 09/13/2018 CLINICAL DATA:  Code stroke. Initial evaluation for bilateral leg weakness, facial droop. EXAM: CT HEAD WITHOUT CONTRAST TECHNIQUE: Contiguous axial images were obtained from the base of the skull through the vertex without intravenous contrast. COMPARISON:  Prior CT from 05/08/2018. FINDINGS:  Brain: Generalized age-related cerebral atrophy with moderate chronic microvascular ischemic disease. Small remote lacunar infarct present at the right lentiform nucleus. No acute intracranial hemorrhage. No acute large vessel territory infarct. No mass lesion, midline shift or mass effect. No hydrocephalus. No extra-axial fluid collection. Vascular: No hyperdense vessel. Scattered vascular calcifications noted within the carotid siphons. Skull: Scalp soft tissues and calvarium within normal limits. Sinuses/Orbits: Globes orbital soft tissues normal. Chronic right maxillary sinusitis. Paranasal sinuses are otherwise clear. No mastoid effusion. Other: None. ASPECTS [facility] Stroke Program Early CT Score) - Ganglionic level infarction (caudate, lentiform nuclei, internal capsule, insula, M1-M3 cortex): 7 - Supraganglionic infarction (M4-M6 cortex): 3 Total score (0-10 with 10 being normal): 10 IMPRESSION: 1. No acute intracranial infarct or other abnormality identified. 2. ASPECTS is 10. 3. Generalized age-related cerebral atrophy with moderate chronic small vessel ischemic disease, with superimposed small remote lacunar infarct involving the right lentiform nucleus. 4. Chronic right maxillary sinusitis. Critical Value/emergent results were called by telephone at the time of interpretation on 09/13/2018 at 5:18 pm to Dr. Lenise Arena , who verbally acknowledged these results. Electronically Signed   By: Jeannine Boga M.D.   On: 09/13/2018 17:19    EKG: Orders placed or performed during the hospital encounter of 09/13/18  . ED EKG  .  ED EKG    IMPRESSION AND PLAN: *Acute probable TIA Presenting with facial droop, confusion, mild headache, right-sided paresthesias which have resolved Evaluated by telemetry neurology-patient is not a candidate for TPA, recommended admission for further evaluation, admit to regular nursing for bed on our TIA versus CVA protocol, continue aspirin, statin therapy,  consider neurology consultation in the morning, check MRI of the brain, echocardiogram, carotid Dopplers, increase nursing care PRN, aspiration/fall precautions while in house  *Chronic dementia Appears at baseline Continue Aricept  *Chronic diabetes mellitus type 2 Hold metformin while in house, sliding scale insulin with access per routine  *Chronic GERD without esophagitis PPI daily  *Chronic hypertension Stable Continue home regiment  *Chronic hypothyroidism, unspecified Continue Synthroid  DVT prophylaxis with Lovenox subcu Disposition to home on tomorrow barring complications  All the records are reviewed and case discussed with ED provider. Management plans discussed with the patient, family and they are in agreement.  CODE STATUS:full Code Status History    Date Active Date Inactive Code Status Order ID Comments User Context   08/22/2018 0023 08/22/2018 1750 Full Code [id number]  Lance Coon, MD ED   05/08/2018 0443 05/11/2018 2055 Full Code [id number]  Rise Patience, MD ED   06/14/2016 0244 06/16/2016 1520 Full Code [id number]  Norval Morton, MD Inpatient   04/26/2012 2006 04/29/2012 1806 Full Code [id number]  Louis Meckel, RN Inpatient       TOTAL TIME TAKING CARE OF THIS PATIENT: 40 minutes.    Avel Peace Karessa Onorato M.D on 09/13/2018   Between 7am to 6pm - Pager - [phone]  After 6pm go to [email or web address] - password [email or web address] [facility]  Office  [phone]  CC: Primary care physician; Pleas Koch, NP   Note: This dictation was prepared with Dragon dictation along with smaller phrase technology. Any transcriptional errors that result from this process are unintentional.

## 2018-09-13 NOTE — ED Triage Notes (Addendum)
Pt via POV with RT sided facial droop since 1430 with bilat lower extremity pain. Denies any  numbness. PT A&OX4, speech clear

## 2018-09-13 NOTE — Progress Notes (Signed)
Family Meeting Note  Advance Directive:yes  Today a meeting took place with the Patient.  Patient is able to participate   The following clinical team members were present during this meeting:MD  The following were discussed:Patient's diagnosis:83 y.o. female with a known history per below which includes vascular dementia, history of CVAs, presenting from home with acute facial droop that started around 2:30 PM, patient had complained of bilateral leg weakness, mild headache, confusion per family, right-sided numbness, and emergency room work-up was unimpressive, patient evaluated by telemetry neurology-recommended inpatient admission for MRI/further evaluation, patient is not a candidate for TPA as symptoms have largely resolved, patient evaluated in the emergency room, patient is poor historian due to dementia, patient not been on it for acute facial droop and confusion with paresthesias concerning for TIA.  , Patient's progosis: Unable to determine and Goals for treatment: Full Code  Additional follow-up to be provided: prn  Time spent during discussion:20 minutes  Gorden Harms, MD

## 2018-09-13 NOTE — ED Triage Notes (Signed)
FIRST NURSE NOTE-bilateral leg weakness/soreness starting yesterday. Facial droop and some disorientation started today at 1430 per family.  Hx stroke. Called per dr Jimmye Norman,

## 2018-09-13 NOTE — ED Provider Notes (Signed)
[facility] Emergency Department Provider Note  ____________________________________________  Time seen: Approximately 5:48 PM  I have reviewed the triage vital signs and the nursing notes.   HISTORY  Chief Complaint Facial Droop   HPI Shannon Higgins is a 84 y.o. female history of a CVA 10 years ago with no deficits, diabetes, hypertension, rheumatoid arthritis who presents for evaluation of facial droop.  According to the patient she is has been having generalized weakness since yesterday.  At 2:30PM, her daughter noticed a mild right-sided facial droop.  Patient also had a mild headache earlier today.  No headache at this time, she denies any weakness at this time, no numbness, no chest pain, no abdominal pain, no vomiting, no diarrhea, no fever or chills, no flulike symptoms.  Past Medical History:  Diagnosis Date   Asthma    Breast cancer ([place])    Chronic sinusitis    Diabetes mellitus without complication (HCC)    GERD (gastroesophageal reflux disease)    History of recurrent UTIs    Hypertension    Insomnia    Migraine    Neuropathic pain    Pneumonia    Rheumatoid arthritis ([place])    Sleep apnea    Stroke [facility])    Thyroid disease     Patient Active Problem List   Diagnosis Date Noted   TIA (transient ischemic attack) 09/13/2018   Acute non-recurrent frontal sinusitis 07/08/2018   Recurrent UTI 06/20/2018   Closed avulsion fracture of medial malleolus, right, with routine healing, subsequent encounter 05/11/2018   Type 2 diabetes mellitus with peripheral neuropathy ([place]) 05/11/2018   Orthostatic hypotension 05/08/2018   Malignant neoplasm of upper-outer quadrant of left breast in female, estrogen receptor positive ([place]) 10/17/2017   MDD (major depressive disorder) 06/21/2017   Chronic midline low back pain with sciatica 01/13/2017   Sinusitis 12/16/2016   Hyperlipidemia 12/06/2016   Glaucoma 12/06/2016    Neuropathic pain 12/06/2016   Skin cancer (melanoma) ([place]) 12/06/2016   Confusion 12/06/2016   Chronic headaches 12/06/2016   Transient hypotension 06/14/2016   AKI (acute kidney injury) ([place]) 06/14/2016   Hypothyroidism 06/14/2016   Hyponatremia    CAP (community acquired pneumonia) 04/29/2012   Sepsis ([place]) 04/26/2012   Hypokalemia 04/26/2012   Anemia 04/26/2012   HTN (hypertension) 04/26/2012   GERD (gastroesophageal reflux disease) 04/26/2012   OSA (obstructive sleep apnea) 04/26/2012    Past Surgical History:  Procedure Laterality Date   ABDOMINAL HYSTERECTOMY     BACK SURGERY     BREAST LUMPECTOMY     CHOLECYSTECTOMY     KNEE SURGERY     SHOULDER SURGERY      Prior to Admission medications   Medication Sig Start Date End Date Taking? Authorizing Provider  anastrozole (ARIMIDEX) 1 MG tablet Take 1 mg by mouth daily.   Yes [provider]  aspirin 81 MG tablet Take 81 mg by mouth daily.   Yes [provider]  brimonidine (ALPHAGAN) 0.2 % ophthalmic solution Place 1 drop into both eyes 3 (three) times daily. Both eyes    Yes [provider]  donepezil (ARICEPT) 10 MG tablet Take 1 tablet daily Patient taking differently: Take 10 mg by mouth daily.  02/23/18  Yes Cameron Sprang, MD  dorzolamide-timolol (COSOPT) 22.3-6.8 MG/ML ophthalmic solution Place 1 drop into both eyes 2 (two) times daily.   Yes [provider]  DULoxetine (CYMBALTA) 60 MG capsule Take 1 capsule by mouth once daily for depression.  12/26/17  Yes Pleas Koch, NP  gabapentin (NEURONTIN) 800 MG tablet TAKE 1 TABLET BY MOUTH THREE TIMES A DAY 06/13/18  Yes Pleas Koch, NP  latanoprost (XALATAN) 0.005 % ophthalmic solution Place 1 drop into both eyes at bedtime.   Yes [provider]  levothyroxine (SYNTHROID, LEVOTHROID) 125 MCG tablet Take 1 tablet (125 mcg total) by mouth daily before breakfast. Take 1 tablet by mouth every morning  on an empty stomach with a full glass of water. No food or other medications for 30 minutes. 07/04/18  Yes Pleas Koch, NP  metFORMIN (GLUCOPHAGE) 500 MG tablet Take 1 tablet by mouth twice daily with food for diabetes. 12/26/17  Yes Pleas Koch, NP  omeprazole (PRILOSEC) 20 MG capsule Take 20 mg by mouth 2 (two) times a day. 07/05/18  Yes [provider]  propranolol (INDERAL) 80 MG tablet Take 1 tablet by mouth once daily for blood pressure. 12/26/17  Yes Pleas Koch, NP  rosuvastatin (CRESTOR) 10 MG tablet Take 1 tablet by mouth once daily for cholesterol. 12/27/17  Yes Pleas Koch, NP  albuterol (VENTOLIN HFA) 108 (90 Base) MCG/ACT inhaler Inhale 2 puffs into the lungs every 4 (four) hours as needed for shortness of breath. 08/27/18   Pleas Koch, NP  blood glucose meter kit and supplies Dispense based on patient and insurance preference. Use up to 2 times daily as directed. (FOR ICD-10 E10.9, E11.9). 07/26/17   Pleas Koch, NP  cetirizine (ZYRTEC) 10 MG chewable tablet Chew 1 tablet by mouth once daily as needed for allergies. 12/26/17   Pleas Koch, NP  fluticasone [facility]) 50 MCG/ACT nasal spray USE 2 SPRAYS IN [facility] NOSTRIL EVERY DAY 08/17/18   Pleas Koch, NP  meclizine (ANTIVERT) 25 MG tablet Take 1 tablet (25 mg total) by mouth 2 (two) times daily as needed for dizziness. 06/21/17   Pleas Koch, NP  SUMAtriptan (IMITREX) 25 MG tablet Take as needed for migraine. Do not take more than twice a week Patient taking differently: Take 25 mg by mouth daily as needed for migraine. Do not take more than twice a week 02/23/18   Cameron Sprang, MD  traZODone (DESYREL) 100 MG tablet Take 2 tablets (200 mg total) by mouth at bedtime as needed for sleep. 08/27/18   Pleas Koch, NP    Allergies Patient has no known allergies.  Family History  Problem Relation Age of Onset   Diabetes Daughter    Hypertension Daughter     Fibromyalgia Daughter    GER disease Daughter    Fibromyalgia Daughter    Crohn's disease Daughter    Asthma Daughter    Hypertension Mother     Social History Social History   Tobacco Use   Smoking status: Never Smoker   Smokeless tobacco: Never Used  Substance Use Topics   Alcohol use: No   Drug use: No    Review of Systems  Constitutional: Negative for fever. + generalized weakness Eyes: Negative for visual changes. ENT: Negative for sore throat. Neck: No neck pain  Cardiovascular: Negative for chest pain. Respiratory: Negative for shortness of breath. Gastrointestinal: Negative for abdominal pain, vomiting or diarrhea. Genitourinary: Negative for dysuria. Musculoskeletal: Negative for back pain. Skin: Negative for rash. Neurological: Negative for headaches, weakness or numbness. + R facial droop Psych: No SI or HI  ____________________________________________   PHYSICAL EXAM:  VITAL SIGNS: ED Triage Vitals  Enc Vitals Group  BP 09/13/18 1653 (!) 148/62     Pulse Rate 09/13/18 1653 88     Resp 09/13/18 1653 16     Temp 09/13/18 1653 97.6 F (36.4 C)     Temp Source 09/13/18 1653 Oral     SpO2 09/13/18 1653 98 %     Weight --      Height --      Head Circumference --      Peak Flow --      Pain Score 09/13/18 1707 0     Pain Loc --      Pain Edu? --      Excl. in [place]? --     Constitutional: Alert and oriented. Well appearing and in no apparent distress. HEENT:      Head: Normocephalic and atraumatic.         Eyes: Conjunctivae are normal. Sclera is non-icteric.       Mouth/Throat: Mucous membranes are moist.       Neck: Supple with no signs of meningismus. Cardiovascular: Regular rate and rhythm. No murmurs, gallops, or rubs. 2+ symmetrical distal pulses are present in all extremities. No JVD. Respiratory: Normal respiratory effort. Lungs are clear to auscultation bilaterally. No wheezes, crackles, or rhonchi.  Gastrointestinal: Soft,  non tender, and non distended with positive bowel sounds. No rebound or guarding. Musculoskeletal: Nontender with normal range of motion in all extremities. No edema, cyanosis, or erythema of extremities. Neurologic: Normal speech and language. Face is symmetric. EOMI, PERRL, strength equal x4, no pronator drift, no dysmetria Skin: Skin is warm, dry and intact. No rash noted. Psychiatric: Mood and affect are normal. Speech and behavior are normal.  ____________________________________________   LABS (all labs ordered are listed, but only abnormal results are displayed)  Labs Reviewed  CBC - Abnormal; Notable for the following components:      Result Value   Hemoglobin 11.0 (*)    HCT 35.6 (*)    All other components within normal limits  GLUCOSE, CAPILLARY - Abnormal; Notable for the following components:   Glucose-Capillary 105 (*)    All other components within normal limits  SARS CORONAVIRUS 2 (HOSPITAL ORDER, [place] [facility])  PROTIME-INR  APTT  DIFFERENTIAL  COMPREHENSIVE METABOLIC PANEL  GLUCOSE, CAPILLARY  HEMOGLOBIN A1C  LIPID PANEL  CBG MONITORING, ED   ____________________________________________  EKG  ED ECG REPORT I, Rudene Re, the attending physician, personally viewed and interpreted this ECG.  Normal sinus rhythm, rate of 82, first-degree AV block, normal QTC, normal axis, no ST elevations or depressions.  Otherwise normal EKG. ____________________________________________  RADIOLOGY  I have personally reviewed the images performed during this visit and I agree with the Radiologist's read.   Interpretation by Radiologist:  Dg Chest Port 1 View  Result Date: 09/13/2018 CLINICAL DATA:  Acute onset patient root today. EXAM: PORTABLE CHEST 1 VIEW COMPARISON:  CT chest and single view of the chest 08/21/2018. FINDINGS: Lungs clear. Heart size normal. No pneumothorax or pleural fluid. No acute or focal bony abnormality. Bilateral  shoulder replacements and surgical clips left axilla noted. IMPRESSION: No acute disease. Electronically Signed   By: Inge Rise M.D.   On: 09/13/2018 21:18   Ct Head Code Stroke Wo Contrast  Result Date: 09/13/2018 CLINICAL DATA:  Code stroke. Initial evaluation for bilateral leg weakness, facial droop. EXAM: CT HEAD WITHOUT CONTRAST TECHNIQUE: Contiguous axial images were obtained from the base of the skull through the vertex without intravenous contrast. COMPARISON:  Prior CT from 05/08/2018. FINDINGS: Brain: Generalized age-related cerebral atrophy with moderate chronic microvascular ischemic disease. Small remote lacunar infarct present at the right lentiform nucleus. No acute intracranial hemorrhage. No acute large vessel territory infarct. No mass lesion, midline shift or mass effect. No hydrocephalus. No extra-axial fluid collection. Vascular: No hyperdense vessel. Scattered vascular calcifications noted within the carotid siphons. Skull: Scalp soft tissues and calvarium within normal limits. Sinuses/Orbits: Globes orbital soft tissues normal. Chronic right maxillary sinusitis. Paranasal sinuses are otherwise clear. No mastoid effusion. Other: None. ASPECTS [facility] Stroke Program Early CT Score) - Ganglionic level infarction (caudate, lentiform nuclei, internal capsule, insula, M1-M3 cortex): 7 - Supraganglionic infarction (M4-M6 cortex): 3 Total score (0-10 with 10 being normal): 10 IMPRESSION: 1. No acute intracranial infarct or other abnormality identified. 2. ASPECTS is 10. 3. Generalized age-related cerebral atrophy with moderate chronic small vessel ischemic disease, with superimposed small remote lacunar infarct involving the right lentiform nucleus. 4. Chronic right maxillary sinusitis. Critical Value/emergent results were called by telephone at the time of interpretation on 09/13/2018 at 5:18 pm to Dr. Lenise Arena , who verbally acknowledged these results. Electronically Signed   By:  Jeannine Boga M.D.   On: 09/13/2018 17:19     ____________________________________________   PROCEDURES  Procedure(s) performed: None Procedures Critical Care performed:  None ____________________________________________   INITIAL IMPRESSION / ASSESSMENT AND PLAN / ED COURSE   84 y.o. female history of a CVA 10 years ago with no deficits, diabetes, hypertension, rheumatoid arthritis who presents for evaluation of facial droop noticed by her daughter at 2:30 PM.  Patient was made a code stroke.  Patient's only complaint is generalized weakness.  She is otherwise neurologically intact on exam.  She was evaluated by neurology who recommended admission for stroke work-up.  Initial NIH stroke scale of 0.  EKG with no evidence of dysrhythmias. Labs with no acute findings. Will admit to Hospitalist      As part of my medical decision making, I reviewed the following data within the [place] notes reviewed and incorporated, Labs reviewed , EKG interpreted , Old EKG reviewed, Old chart reviewed, Radiograph reviewed , Discussed with admitting physician , A consult was requested and obtained from this/these consultant(s) Neurology, Notes from prior ED visits and [place] Controlled Substance Database    Pertinent labs & imaging results that were available during my care of the patient were reviewed by me and considered in my medical decision making (see chart for details).    ____________________________________________   FINAL CLINICAL IMPRESSION(S) / ED DIAGNOSES  Final diagnoses:  TIA (transient ischemic attack)      NEW MEDICATIONS STARTED DURING THIS VISIT:  ED Discharge Orders    None       Note:  This document was prepared using Dragon voice recognition software and may include unintentional dictation errors.    Rudene Re, MD 09/13/18 [id number]

## 2018-09-13 NOTE — ED Notes (Signed)
Pt resting quietly. Denies needs at this time. Swab collected.

## 2018-09-13 NOTE — Consult Note (Signed)
[facility] Tele[facility]urology Consult Services   Date of Service:   09/13/2018 17:10:16  Impression:     .  Rule Out Acute Ischemic Stroke     .  Small Vessel Infarct     .  Left Hemispheric Infarct  Comments/Sign-Out: patient with history of stroke (doesnt remember symptoms), Hypertension, Hyperlipidemia, Diabetes. presents with complaint of right facial droop and confusion but on exam only right sided numbness is detected . Head CT: No acute Intracranial abnormality. NIHSS 1 presentation consistent with a small Acute Ischemic Stroke symptoms too mild and not disabling, the risks of IV tPA would outweigh the potential benefits. symptoms not suggestive of Large Vessel Occlusion.  Mechanism of Stroke: Possible Thromboembolic Possible Cardioembolic Small Vessel Disease  Metrics: Last Known Well: Unknown [facility] Notification Time: 09/13/2018 17:10:16 Arrival Time: 09/13/2018 16:55:00 Stamp Time: 09/13/2018 17:10:16 Time First Login Attempt: 09/13/2018 17:16:26 Video Start Time: 09/13/2018 17:16:26  Symptoms: right facial droop and confusion NIHSS Start Assessment Time: 09/13/2018 17:20:00 Patient is not a candidate for tPA. Patient was not deemed candidate for tPA thrombolytics because of Resolved symptoms (no residual disabling symptoms). Video End Time: 09/13/2018 17:26:52  CT head showed no acute hemorrhage or acute core infarct. CT head was reviewed.  Clinical Presentation is not Suggestive of Large Vessel Occlusive Disease  Radiologist was not called back for review of advanced imaging because CTA not obtained ED Physician notified of diagnostic impression and management plan on 09/13/2018 17:28:21  Our recommendations are outlined below.  Recommendations:     .  Activate Stroke Protocol Admission/Order Set     .  Stroke/Telemetry Floor     .  Neuro Checks     .  Bedside Swallow Eval     .  DVT Prophylaxis     .  IV Fluids, Normal Saline  .  Head of Bed 30 Degrees     .  Euglycemia and Avoid Hyperthermia (PRN Acetaminophen)     .  Antiplatelet Therapy Recommended     .  noncontrast brain MRI     .  head and neck MRA or CTA     .  ECHO  Routine Consultation with [facility] for Follow up Care  Sign Out:     .  Discussed with Emergency Department Provider    ------------------------------------------------------------------------------  History of Present Illness: Patient is a 84 year old Female.  Patient was brought by private transportation with symptoms of right facial droop and confusion  Patient with history of hypertension, Hyperlipidemia/Hypercholesterolemia , sleep apnea, Diabetes Mellitus, stroke, migraine last known well per family: some time yesterday developed bilateral leg weakness and today at 14:30 was noted to have right sided facial droop and confusion. No headache today.   Examination: 1A: Level of Consciousness - Alert; keenly responsive + 0 1B: Ask Month and Age - Both Questions Right + 0 1C: Blink Eyes & Squeeze Hands - Performs Both Tasks + 0 2: Test Horizontal Extraocular Movements - Normal + 0 3: Test Visual Fields - No Visual Loss + 0 4: Test Facial Palsy (Use Grimace if Obtunded) - Normal symmetry + 0 5A: Test Left Arm Motor Drift - No Drift for 10 Seconds + 0 5B: Test Right Arm Motor Drift - No Drift for 10 Seconds + 0 6A: Test Left Leg Motor Drift - No Drift for 5 Seconds + 0 6B: Test Right Leg Motor Drift - No Drift for 5 Seconds + 0 7: Test Limb Ataxia (FNF/Heel-Shin) - No Ataxia +  0 8: Test Sensation - Mild-Moderate Loss: Less Sharp/More Dull + 1 9: Test Language/Aphasia - Normal; No aphasia + 0 10: Test Dysarthria - Normal + 0 11: Test Extinction/Inattention - No abnormality + 0  NIHSS Score: 1  Patient/Family was informed the Neurology Consult would happen via TeleHealth consult by way of interactive audio and video telecommunications and consented to receiving care in  this manner.  Due to the immediate potential for life-threatening deterioration due to underlying acute neurologic illness, I spent 10 minutes providing critical care. This time includes time for face to face visit via telemedicine, review of medical records, imaging studies and discussion of findings with providers, the patient and/or family.   Dr Elenor Quinones   [facility] [phone]   Case [id number]

## 2018-09-13 NOTE — ED Notes (Addendum)
Pt placed on 2L O2 via n/c for O2 sat 88% while sleeping. While awake sat >94%. Secure chat message sent to Salary, MD.

## 2018-09-13 NOTE — ED Notes (Addendum)
ED TO INPATIENT HANDOFF REPORT  ED Nurse Name and Phone #:  [phone]rie  S Name/Age/Gender Shannon Higgins 84 y.o. female Room/Bed: ED13A/ED13A  Code Status   Code Status: Prior  Home/SNF/Other Home Patient oriented to: self, place, time and situation Is this baseline? Yes   Triage Complete: Triage complete  Chief Complaint Disoriented  Triage Note FIRST NURSE NOTE-bilateral leg weakness/soreness starting yesterday. Facial droop and some disorientation started today at 1430 per family.  Hx stroke. Called per dr Jimmye Norman,  Pt via POV with RT sided facial droop since 1430 with bilat lower extremity pain. Denies any  numbness. PT A&OX4, speech clear    Allergies No Known Allergies  Level of Care/Admitting Diagnosis ED Disposition    ED Disposition Condition [facility] Area: [place] [[id number]]  Level of Care: Med-Surg [16]  Covid Evaluation: N/A  Diagnosis: TIA (transient ischemic attack) [546270]  Admitting Physician: Gorden Harms [[id number]]  Attending Physician: Gorden Harms [[id number]]  PT Class (Do Not Modify): Observation [104]  PT Acc Code (Do Not Modify): Observation [10022]       B Medical/Surgery History Past Medical History:  Diagnosis Date  . Asthma   . Breast cancer ([place])   . Chronic sinusitis   . Diabetes mellitus without complication ([place])   . GERD (gastroesophageal reflux disease)   . History of recurrent UTIs   . Hypertension   . Insomnia   . Migraine   . Neuropathic pain   . Pneumonia   . Rheumatoid arthritis ([place])   . Sleep apnea   . Stroke ([place])   . Thyroid disease    Past Surgical History:  Procedure Laterality Date  . ABDOMINAL HYSTERECTOMY    . BACK SURGERY    . BREAST LUMPECTOMY    . CHOLECYSTECTOMY    . KNEE SURGERY    . SHOULDER SURGERY       A IV Location/Drains/Wounds Patient Lines/Drains/Airways Status   Active Line/Drains/Airways    Name:   Placement date:   Placement  time:   Site:   Days:   Peripheral IV 09/13/18 Left Forearm   09/13/18    1740    Forearm   less than 1          Intake/Output Last 24 hours No intake or output data in the 24 hours ending 09/13/18 1905  Labs/Imaging Results for orders placed or performed during the hospital encounter of 09/13/18 (from the past 48 hour(s))  Protime-INR     Status: None   Collection Time: 09/13/18  4:54 PM  Result Value Ref Range   Prothrombin Time 13.0 11.4 - 15.2 seconds   INR 1.0 0.8 - 1.2    Comment: (NOTE) INR goal varies based on device and disease states. Performed at [facility], [address] [place], [place] [address]   APTT     Status: None   Collection Time: 09/13/18  4:54 PM  Result Value Ref Range   aPTT 28 24 - 36 seconds    Comment: Performed at [facility], [address] [place], [place] [address]  CBC     Status: Abnormal   Collection Time: 09/13/18  4:54 PM  Result Value Ref Range   WBC 7.7 4.0 - 10.5 K/uL   RBC 4.10 3.87 - 5.11 MIL/uL   Hemoglobin 11.0 (L) 12.0 - 15.0 g/dL   HCT 35.6 (L) 36.0 - 46.0 %   MCV 86.8 80.0 - 100.0  fL   MCH 26.8 26.0 - 34.0 pg   MCHC 30.9 30.0 - 36.0 g/dL   RDW 14.0 11.5 - 15.5 %   Platelets 285 150 - 400 K/uL   nRBC 0.0 0.0 - 0.2 %    Comment: Performed at [facility], [address] [place], [place] [address]  Differential     Status: None   Collection Time: 09/13/18  4:54 PM  Result Value Ref Range   Neutrophils Relative % 49 %   Neutro Abs 3.8 1.7 - 7.7 K/uL   Lymphocytes Relative 33 %   Lymphs Abs 2.6 0.7 - 4.0 K/uL   Monocytes Relative 13 %   Monocytes Absolute 1.0 0.1 - 1.0 K/uL   Eosinophils Relative 4 %   Eosinophils Absolute 0.3 0.0 - 0.5 K/uL   Basophils Relative 1 %   Basophils Absolute 0.0 0.0 - 0.1 K/uL   Immature Granulocytes 0 %   Abs Immature Granulocytes 0.02 0.00 - 0.07 K/uL    Comment: Performed at [facility], [address] [place], [place] [address]   Comprehensive metabolic panel     Status: None   Collection Time: 09/13/18  4:54 PM  Result Value Ref Range   Sodium 139 135 - 145 mmol/L   Potassium 3.9 3.5 - 5.1 mmol/L   Chloride 104 98 - 111 mmol/L   CO2 26 22 - 32 mmol/L   Glucose, Bld 97 70 - 99 mg/dL   BUN 14 8 - 23 mg/dL   Creatinine, Ser 0.79 0.44 - 1.00 mg/dL   Calcium 9.1 8.9 - 10.3 mg/dL   Total Protein 6.9 6.5 - 8.1 g/dL   Albumin 3.9 3.5 - 5.0 g/dL   AST 20 15 - 41 U/L   ALT 12 0 - 44 U/L   Alkaline Phosphatase 89 38 - 126 U/L   Total Bilirubin 0.3 0.3 - 1.2 mg/dL   GFR calc non Af Amer >60 >60 mL/min   GFR calc Af Amer >60 >60 mL/min   Anion gap 9 5 - 15    Comment: Performed at [facility], [address] [place], [place] [address]  Glucose, capillary     Status: Abnormal   Collection Time: 09/13/18  5:05 PM  Result Value Ref Range   Glucose-Capillary 105 (H) 70 - 99 mg/dL   Ct Head Code Stroke Wo Contrast  Result Date: 09/13/2018 CLINICAL DATA:  Code stroke. Initial evaluation for bilateral leg weakness, facial droop. EXAM: CT HEAD WITHOUT CONTRAST TECHNIQUE: Contiguous axial images were obtained from the base of the skull through the vertex without intravenous contrast. COMPARISON:  Prior CT from 05/08/2018. FINDINGS: Brain: Generalized age-related cerebral atrophy with moderate chronic microvascular ischemic disease. Small remote lacunar infarct present at the right lentiform nucleus. No acute intracranial hemorrhage. No acute large vessel territory infarct. No mass lesion, midline shift or mass effect. No hydrocephalus. No extra-axial fluid collection. Vascular: No hyperdense vessel. Scattered vascular calcifications noted within the carotid siphons. Skull: Scalp soft tissues and calvarium within normal limits. Sinuses/Orbits: Globes orbital soft tissues normal. Chronic right maxillary sinusitis. Paranasal sinuses are otherwise clear. No mastoid effusion. Other: None. ASPECTS [facility] Stroke Program Early  CT Score) - Ganglionic level infarction (caudate, lentiform nuclei, internal capsule, insula, M1-M3 cortex): 7 - Supraganglionic infarction (M4-M6 cortex): 3 Total score (0-10 with 10 being normal): 10 IMPRESSION: 1. No acute intracranial infarct or other abnormality identified. 2. ASPECTS is 10. 3. Generalized age-related cerebral atrophy with moderate chronic small  vessel ischemic disease, with superimposed small remote lacunar infarct involving the right lentiform nucleus. 4. Chronic right maxillary sinusitis. Critical Value/emergent results were called by telephone at the time of interpretation on 09/13/2018 at 5:18 pm to Dr. Lenise Arena , who verbally acknowledged these results. Electronically Signed   By: Jeannine Boga M.D.   On: 09/13/2018 17:19    Pending Labs [employer] (From admission, onward)    Start     Ordered   Signed and Held  Hemoglobin A1c  Tomorrow morning,   R     Signed and Held   Signed and Held  Lipid panel  Tomorrow morning,   R    Comments:  Fasting    Signed and Held   Signed and Held  CBC  (enoxaparin (LOVENOX)    CrCl >/= 30 ml/min)  Once,   R    Comments:  Baseline for enoxaparin therapy IF NOT ALREADY DRAWN.  Notify MD if PLT < 100 K.    Signed and Held   Signed and Held  Creatinine, serum  (enoxaparin (LOVENOX)    CrCl >/= 30 ml/min)  Once,   R    Comments:  Baseline for enoxaparin therapy IF NOT ALREADY DRAWN.    Signed and Held   Signed and Held  Creatinine, serum  (enoxaparin (LOVENOX)    CrCl >/= 30 ml/min)  Weekly,   R    Comments:  while on enoxaparin therapy    Signed and Held          Vitals/Pain Today's Vitals   09/13/18 1817 09/13/18 1830 09/13/18 1842 09/13/18 1845  BP: (!) 143/69 (!) 145/72  (!) 152/78  Pulse: 86 80 82 90  Resp: (!) 21 16 17  (!) 23  Temp:      TempSrc:      SpO2: 92% 94% (!) 88% 93%  PainSc:        Isolation Precautions Droplet and Contact precautions  Medications Medications  aspirin chewable  tablet 324 mg (324 mg Oral Given 09/13/18 1845)    Mobility walks with person assist Low fall risk   Focused Assessments Cardiac Assessment Handoff:  Cardiac Rhythm: Normal sinus rhythm Lab Results  Component Value Date   CKTOTAL 67 05/08/2018   TROPONINI <0.03 08/21/2018   No results found for: DDIMER Does the Patient currently have chest pain? No  , Neuro Assessment Handoff:  Swallow screen pass? Yes  Cardiac Rhythm: Normal sinus rhythm NIH Stroke Scale ( + Modified Stroke Scale Criteria)  Interval: Initial Level of Consciousness (1a.)   : Alert, keenly responsive LOC Questions (1b. )   +: Answers both questions correctly LOC Commands (1c. )   + : Performs both tasks correctly Best Gaze (2. )  +: Normal Visual (3. )  +: No visual loss Facial Palsy (4. )    : Minor paralysis Motor Arm, Left (5a. )   +: No drift Motor Arm, Right (5b. )   +: No drift Motor Leg, Left (6a. )   +: No drift Motor Leg, Right (6b. )   +: No drift Limb Ataxia (7. ): Absent Sensory (8. )   +: Mild-to-moderate sensory loss, patient feels pinprick is less sharp or is dull on the affected side, or there is a loss of superficial pain with pinprick, but patient is aware of being touched Best Language (9. )   +: No aphasia Dysarthria (10. ): Normal Extinction/Inattention (11.)   +: No Abnormality Modified SS Total  +:  1 Complete NIHSS TOTAL: 2 Last date known well: 09/13/18 Last time known well: 1430 Neuro Assessment:   Neuro Checks:   Initial (09/13/18 1700)  Last Documented NIHSS Modified Score: 1 (09/13/18 1830) Has TPA been given? No If patient is a Neuro Trauma and patient is going to OR before floor call report to [place] nurse: [phone] or [phone]  , Pulmonary Assessment Handoff:  Lung sounds:   O2 Device: Room Air. Needs 2L via n/c while asleep, sat WNL on room air while awake.         R Recommendations: See Admitting Provider Note  Report given to:   Additional  Notes:

## 2018-09-14 ENCOUNTER — Telehealth: Payer: Self-pay | Admitting: Primary Care

## 2018-09-14 ENCOUNTER — Observation Stay: Payer: Medicare HMO

## 2018-09-14 ENCOUNTER — Observation Stay (HOSPITAL_BASED_OUTPATIENT_CLINIC_OR_DEPARTMENT_OTHER)
Admit: 2018-09-14 | Discharge: 2018-09-14 | Disposition: A | Discharge status: Home / Self Care | Payer: Medicare HMO | Attending: Family Medicine | Admitting: Family Medicine

## 2018-09-14 DIAGNOSIS — E119 Type 2 diabetes mellitus without complications: Secondary | ICD-10-CM | POA: Diagnosis not present

## 2018-09-14 DIAGNOSIS — G459 Transient cerebral ischemic attack, unspecified: Secondary | ICD-10-CM

## 2018-09-14 DIAGNOSIS — I10 Essential (primary) hypertension: Secondary | ICD-10-CM | POA: Diagnosis not present

## 2018-09-14 DIAGNOSIS — I6523 Occlusion and stenosis of bilateral carotid arteries: Secondary | ICD-10-CM | POA: Diagnosis not present

## 2018-09-14 DIAGNOSIS — F0390 Unspecified dementia without behavioral disturbance: Secondary | ICD-10-CM | POA: Diagnosis not present

## 2018-09-14 LAB — GLUCOSE, CAPILLARY
Glucose-Capillary: 122 mg/dL — ABNORMAL HIGH (ref 70–99)
Glucose-Capillary: 103 mg/dL — ABNORMAL HIGH (ref 70–99)
Glucose-Capillary: 95 mg/dL (ref 70–99)

## 2018-09-14 LAB — LIPID PANEL
Cholesterol: 146 mg/dL (ref 0–200)
HDL: 45 mg/dL (ref >40)
LDL Cholesterol: 52 mg/dL (ref 0–99)
Total CHOL/HDL Ratio: 3.2 RATIO
Triglycerides: 244 mg/dL — ABNORMAL HIGH (ref <150)
VLDL: 49 mg/dL — ABNORMAL HIGH (ref 0–40)

## 2018-09-14 LAB — ECHOCARDIOGRAM COMPLETE: Height: 64 in

## 2018-09-14 LAB — HEMOGLOBIN A1C
Hgb A1c MFr Bld: 6.6 % — ABNORMAL HIGH (ref 4.8–5.6)
Mean Plasma Glucose: 142.72 mg/dL

## 2018-09-14 MED ORDER — ASPIRIN 325 MG PO TABS: 325.0000 mg | ORAL_TABLET | Freq: Every day | ORAL | 1 refills | Status: DC

## 2018-09-14 NOTE — Telephone Encounter (Signed)
[facility] called and said pt needs a hospital follow up in  1 week.

## 2018-09-14 NOTE — Evaluation (Signed)
Physical Therapy Evaluation Patient Details Name: Shannon Higgins MRN: [id number] DOB: 1934/06/04 Today's Date: 09/14/2018   History of Present Illness  84 yo Female came to ED with right side facial drooping and confusion; She was worked up for TIA and possible CVA. MRI/MRA negative for acute abnormality but does show chronic ischemia; PMH significant for vascular dementia, Hx of CVA, hypothyroidism, HTN, type 2 diabetes  Clinical Impression  84 yo Female presents with new onset right side weakness with possible TIA. MRI negative for acute changes. Patient was living at home with her daughter and son-in-law. She reports that recently her daughter has been working from home and has been Engineer, mining. Patient reports needing some help prior to admittance for cooking/cleaning and for bath prep (supervision). She used a RW intermittently for gait safety. She reports 1 fall in January 2020. Patient is currently supervision for bed mobility. She requires CGA for sit<>Stand transfers. Patient ambulated 175 feet with RW with CGA for safety and steadying. She exhibits step through gait pattern but does exhibits decreased stance time on RLE with short LLE step length. This is likely related to increased weakness in RLE (grossly 3/5) as compared to LLE (grossly 4-/5). Patient would benefit from skilled PT intervention to address RLE weakness and improve mobility. Recommend home health PT upon discharge. Patient agreeable. Patient left in chair with chair alarm on and needs in reach;     Follow Up Recommendations Home health PT;Supervision/Assistance - 24 hour    Equipment Recommendations  None recommended by PT    Recommendations for Other Services Rehab consult     Precautions / Restrictions Precautions Precautions: Fall Restrictions Weight Bearing Restrictions: No      Mobility  Bed Mobility Overal bed mobility: Needs Assistance Bed Mobility: Supine to Sit     Supine to sit:  Supervision;HOB elevated        Transfers Overall transfer level: Needs assistance Equipment used: Rolling walker (2 wheeled) Transfers: Sit to/from Stand Sit to Stand: Min guard         General transfer comment: with cues for hand placement for safety;   Ambulation/Gait Ambulation/Gait assistance: Min guard Gait Distance (Feet): 175 Feet Assistive device: Rolling walker (2 wheeled) Gait Pattern/deviations: Step-through pattern;Decreased step length - left;Decreased stance time - right;Narrow base of support Gait velocity: decreased   General Gait Details: exhibits good foot clearance, step through gait pattern, slightly slower gait speed; good safety awareness with RW  Stairs            Wheelchair Mobility    Modified Rankin (Stroke Patients Only)       Balance Overall balance assessment: Needs assistance Sitting-balance support: No upper extremity supported;Feet supported Sitting balance-Leahy Scale: Good     Standing balance support: Bilateral upper extremity supported Standing balance-Leahy Scale: Poor Standing balance comment: uses RW for stability in standing, requires CGA for safety with dynamic walking;                              Pertinent Vitals/Pain Pain Assessment: No/denies pain    Home Living Family/patient expects to be discharged to:: Private residence Living Arrangements: Children Available Help at Discharge: Family;Available PRN/intermittently;Other (Comment)(daughter home most of the day but does have to leave periodically for work) Type of Home: House Home Access: Stairs to enter Entrance Stairs-Rails: None Entrance Stairs-Number of Steps: 6 Home Layout: One level Home Equipment: Environmental consultant - 2 wheels;Shower seat;Wheelchair - manual  Prior Function Level of Independence: Needs assistance   Gait / Transfers Assistance Needed: used RW for some walking but reports being able to walk short distances in home without  AD  ADL's / Homemaking Assistance Needed: daughter did most of cooking and cleaning and would assist in prepping for shower        Hand Dominance   Dominant Hand: Right    Extremity/Trunk Assessment   Upper Extremity Assessment Upper Extremity Assessment: Overall WFL for tasks assessed    Lower Extremity Assessment Lower Extremity Assessment: RLE deficits/detail;LLE deficits/detail RLE Deficits / Details: decreased light touch sensation on plantar surface of foot, intact deep pressure; strength grossly 3/5 LLE Deficits / Details: intact light touch/deep pressure; strength grossly 4-/5    Cervical / Trunk Assessment Cervical / Trunk Assessment: Kyphotic  Communication   Communication: No difficulties  Cognition Arousal/Alertness: Awake/alert Behavior During Therapy: WFL for tasks assessed/performed Overall Cognitive Status: Within Functional Limits for tasks assessed                                 General Comments: oriented to situation, place and person; not assessed date      General Comments General comments (skin integrity, edema, etc.): no swelling noted;     Exercises Other Exercises Other Exercises: Instructed patient in seated LE strengthening exercise: long sitting: ankle DF/PF x10, Quad sets x10, SLR hip flexion x10 all bilaterally with min VCs for correct positioning and to increase ROM for better strengthening; patient exhibits decreased ROM on RLE due to weakness.  Other Exercises: Able to verbalize and demonstrate understanding with teachback method.   Assessment/Plan    PT Assessment Patient needs continued PT services  PT Problem List Decreased strength;Decreased mobility;Decreased safety awareness;Decreased range of motion;Decreased activity tolerance;Decreased balance       PT Treatment Interventions DME instruction;Functional mobility training;Balance training;Patient/family education;Gait training;Therapeutic activities;Neuromuscular  re-education;Stair training;Therapeutic exercise    PT Goals (Current goals can be found in the Care Plan section)  Acute Rehab PT Goals Patient Stated Goal: to go back home PT Goal Formulation: With patient Time For Goal Achievement: 09/28/18 Potential to Achieve Goals: Good    Frequency Min 2X/week   Barriers to discharge Inaccessible home environment has steps to enter; reports son-in-law able to help her negotiate steps    Co-evaluation               AM-PAC PT "6 Clicks" Mobility  Outcome Measure Help needed turning from your back to your side while in a flat bed without using bedrails?: None Help needed moving from lying on your back to sitting on the side of a flat bed without using bedrails?: A Little Help needed moving to and from a bed to a chair (including a wheelchair)?: A Little Help needed standing up from a chair using your arms (e.g., wheelchair or bedside chair)?: A Little Help needed to walk in hospital room?: A Little Help needed climbing 3-5 steps with a railing? : A Lot 6 Click Score: 18    End of Session Equipment Utilized During Treatment: Gait belt Activity Tolerance: Patient tolerated treatment well Patient left: in chair;with chair alarm set;with call bell/phone within reach Nurse Communication: Mobility status PT Visit Diagnosis: Unsteadiness on feet (R26.81);Muscle weakness (generalized) (M62.81)    Time: 2952-8413 PT Time Calculation (min) (ACUTE ONLY): 28 min   Charges:   PT Evaluation $PT Eval Low Complexity: 1 Low PT Treatments $Therapeutic  Exercise: 8-22 mins          Lourene Hoston PT, DPT 09/14/2018, 10:30 AM

## 2018-09-14 NOTE — Discharge Summary (Signed)
Shannon Higgins at [place] NAME: Shannon Higgins    MR#:  [id number]  DATE OF BIRTH:  1934/05/11  DATE OF ADMISSION:  09/13/2018 ADMITTING PHYSICIAN: Gorden Harms, MD  DATE OF DISCHARGE: 09/14/2018  PRIMARY CARE PHYSICIAN: Pleas Koch, NP    ADMISSION DIAGNOSIS:  TIA (transient ischemic attack) [G45.9]  DISCHARGE DIAGNOSIS:  TIA  SECONDARY DIAGNOSIS:   Past Medical History:  Diagnosis Date  . Asthma   . Breast cancer ([place])   . Chronic sinusitis   . Diabetes mellitus without complication ([place])   . GERD (gastroesophageal reflux disease)   . History of recurrent UTIs   . Hypertension   . Insomnia   . Migraine   . Neuropathic pain   . Pneumonia   . Rheumatoid arthritis ([place])   . Sleep apnea   . Stroke ([place])   . Thyroid disease     HOSPITAL COURSE:  Shannon Higgins  is a 84 y.o. female with a known history per below which includes vascular dementia, history of CVAs, presenting from home with acute facial droop that started around 2:30 PM, patient had complained of bilateral leg weakness, mild headache, confusion per family, right-sided numbness  * TIA Presenting with facial droop, confusion, mild headache, right-sided paresthesias which have resolved -Evaluated by telemetry neurology-patient is not a candidate for TPA, -cont asa 325 mg qd -MRI brain neg for acute CVA -Carotid Doppler showed moderate amount of right-sided atherosclerotic plaque, morphologically similar to the 10/2017 examination, and  Not definitely resulting in a hemodynamically significant stenosis.  *Chronic dementia Appears at baseline Continue Aricept  * diabetes mellitus type 2 resume metformin while in house, sliding scale insulin with access per routine  *GERD without esophagitis PPI daily  *Chronic hypertension Stable  * hypothyroidism, unspecified Continue Synthroid  DVT prophylaxis with Lovenox subcu  Patient overall  doing well. She was seen by physical therapy recommends PT. Will discharge patient to home with outpatient follow-up with primary care. Spoke with patient's daughter Shannon Higgins on the phone and updated.  CONSULTS OBTAINED:    DRUG ALLERGIES:  No Known Allergies  DISCHARGE MEDICATIONS:   Allergies as of 09/14/2018   No Known Allergies     Medication List    TAKE these medications   albuterol 108 (90 Base) MCG/ACT inhaler Commonly known as:  VENTOLIN HFA Inhale 2 puffs into the lungs every 4 (four) hours as needed for shortness of breath.   anastrozole 1 MG tablet Commonly known as:  ARIMIDEX Take 1 mg by mouth daily.   aspirin 325 MG tablet Take 1 tablet (325 mg total) by mouth daily. Start taking on:  Sep 15, 2018 What changed:    medication strength  how much to take   blood glucose meter kit and supplies Dispense based on patient and insurance preference. Use up to 2 times daily as directed. (FOR ICD-10 E10.9, E11.9).   brimonidine 0.2 % ophthalmic solution Commonly known as:  ALPHAGAN Place 1 drop into both eyes 3 (three) times daily. Both eyes   cetirizine 10 MG chewable tablet Commonly known as:  ZYRTEC Chew 1 tablet by mouth once daily as needed for allergies.   donepezil 10 MG tablet Commonly known as:  ARICEPT Take 1 tablet daily What changed:    how much to take  how to take this  when to take this  additional instructions   dorzolamide-timolol 22.3-6.8 MG/ML ophthalmic solution Commonly known as:  COSOPT Place 1 drop  into both eyes 2 (two) times daily.   DULoxetine 60 MG capsule Commonly known as:  CYMBALTA Take 1 capsule by mouth once daily for depression.   fluticasone 50 MCG/ACT nasal spray Commonly known as:  FLONASE USE 2 SPRAYS IN EACH NOSTRIL EVERY DAY   gabapentin 800 MG tablet Commonly known as:  NEURONTIN TAKE 1 TABLET BY MOUTH THREE TIMES A DAY   latanoprost 0.005 % ophthalmic solution Commonly known as:  XALATAN Place 1 drop  into both eyes at bedtime.   levothyroxine 125 MCG tablet Commonly known as:  SYNTHROID Take 1 tablet (125 mcg total) by mouth daily before breakfast. Take 1 tablet by mouth every morning on an empty stomach with a full glass of water. No food or other medications for 30 minutes.   meclizine 25 MG tablet Commonly known as:  ANTIVERT Take 1 tablet (25 mg total) by mouth 2 (two) times daily as needed for dizziness.   metFORMIN 500 MG tablet Commonly known as:  GLUCOPHAGE Take 1 tablet by mouth twice daily with food for diabetes.   omeprazole 20 MG capsule Commonly known as:  PRILOSEC Take 20 mg by mouth 2 (two) times a day.   propranolol 80 MG tablet Commonly known as:  INDERAL Take 1 tablet by mouth once daily for blood pressure.   rosuvastatin 10 MG tablet Commonly known as:  Crestor Take 1 tablet by mouth once daily for cholesterol.   SUMAtriptan 25 MG tablet Commonly known as:  IMITREX Take as needed for migraine. Do not take more than twice a week What changed:    how much to take  how to take this  when to take this  reasons to take this  additional instructions   traZODone 100 MG tablet Commonly known as:  DESYREL Take 2 tablets (200 mg total) by mouth at bedtime as needed for sleep.       If you experience worsening of your admission symptoms, develop shortness of breath, life threatening emergency, suicidal or homicidal thoughts you must seek medical attention immediately by calling 911 or calling your MD immediately  if symptoms less severe.  You Must read complete instructions/literature along with all the possible adverse reactions/side effects for all the Medicines you take and that have been prescribed to you. Take any new Medicines after you have completely understood and accept all the possible adverse reactions/side effects.   Please note  You were cared for by a hospitalist during your hospital stay. If you have any questions about your discharge  medications or the care you received while you were in the hospital after you are discharged, you can call the unit and asked to speak with the hospitalist on call if the hospitalist that took care of you is not available. Once you are discharged, your primary care physician will handle any further medical issues. Please note that NO REFILLS for any discharge medications will be authorized once you are discharged, as it is imperative that you return to your primary care physician (or establish a relationship with a primary care physician if you do not have one) for your aftercare needs so that they can reassess your need for medications and monitor your lab values. Today   SUBJECTIVE   Patient feels back to baseline.  VITAL SIGNS:  Blood pressure 138/68, pulse 79, temperature 97.8 F (36.6 C), temperature source Oral, resp. rate 18, height 5' 4"  (1.626 m), SpO2 95 %.  I/O:    Intake/Output Summary (Last 24 hours)  at 09/14/2018 1301 Last data filed at 09/14/2018 0948 Gross per 24 hour  Intake 240 ml  Output -  Net 240 ml    PHYSICAL EXAMINATION:  GENERAL:  84 y.o.-year-old patient lying in the bed with no acute distress.  EYES: Pupils equal, round, reactive to light and accommodation. No scleral icterus. Extraocular muscles intact.  HEENT: Head atraumatic, normocephalic. Oropharynx and nasopharynx clear.  NECK:  Supple, no jugular venous distention. No thyroid enlargement, no tenderness.  LUNGS: Normal breath sounds bilaterally, no wheezing, rales,rhonchi or crepitation. No use of accessory muscles of respiration.  CARDIOVASCULAR: S1, S2 normal. No murmurs, rubs, or gallops.  ABDOMEN: Soft, non-tender, non-distended. Bowel sounds present. No organomegaly or mass.  EXTREMITIES: No pedal edema, cyanosis, or clubbing.  NEUROLOGIC: Cranial nerves II through XII are intact. Muscle strength 5/5 in all extremities. Sensation intact. Gait not checked. No Facial drool. PSYCHIATRIC:  patient is  alert and oriented x 3.  SKIN: No obvious rash, lesion, or ulcer.   DATA REVIEW:   CBC  Recent Labs  Lab 09/13/18 1654  WBC 7.7  HGB 11.0*  HCT 35.6*  PLT 285    Chemistries  Recent Labs  Lab 09/13/18 1654  NA 139  K 3.9  CL 104  CO2 26  GLUCOSE 97  BUN 14  CREATININE 0.79  CALCIUM 9.1  AST 20  ALT 12  ALKPHOS 89  BILITOT 0.3    Microbiology Results   Recent Results (from the past 240 hour(s))  SARS Coronavirus 2 (CEPHEID - Performed in [facility] hospital lab), Hosp Order     Status: None   Collection Time: 09/13/18  7:11 PM  Result Value Ref Range Status   SARS Coronavirus 2 NEGATIVE NEGATIVE Final    Comment: (NOTE) If result is NEGATIVE SARS-CoV-2 target nucleic acids are NOT DETECTED. The SARS-CoV-2 RNA is generally detectable in upper and lower  respiratory specimens during the acute phase of infection. The lowest  concentration of SARS-CoV-2 viral copies this assay can detect is 250  copies / mL. A negative result does not preclude SARS-CoV-2 infection  and should not be used as the sole basis for treatment or other  patient management decisions.  A negative result may occur with  improper specimen collection / handling, submission of specimen other  than nasopharyngeal swab, presence of viral mutation(s) within the  areas targeted by this assay, and inadequate number of viral copies  (<250 copies / mL). A negative result must be combined with clinical  observations, patient history, and epidemiological information. If result is POSITIVE SARS-CoV-2 target nucleic acids are DETECTED. The SARS-CoV-2 RNA is generally detectable in upper and lower  respiratory specimens dur ing the acute phase of infection.  Positive  results are indicative of active infection with SARS-CoV-2.  Clinical  correlation with patient history and other diagnostic information is  necessary to determine patient infection status.  Positive results do  not rule out bacterial  infection or co-infection with other viruses. If result is PRESUMPTIVE POSTIVE SARS-CoV-2 nucleic acids MAY BE PRESENT.   A presumptive positive result was obtained on the submitted specimen  and confirmed on repeat testing.  While 2019 novel coronavirus  (SARS-CoV-2) nucleic acids may be present in the submitted sample  additional confirmatory testing may be necessary for epidemiological  and / or clinical management purposes  to differentiate between  SARS-CoV-2 and other Sarbecovirus currently known to infect humans.  If clinically indicated additional testing with an alternate test  methodology (  [id number]) is advised. The SARS-CoV-2 RNA is generally  detectable in upper and lower respiratory sp ecimens during the acute  phase of infection. The expected result is Negative. Fact Sheet for Patients:  StrictlyIdeas.no Fact Sheet for Healthcare Providers: BankingDealers.co.za This test is not yet approved or cleared by the [place] FDA and has been authorized for detection and/or diagnosis of SARS-CoV-2 by FDA under an Emergency Use Authorization (EUA).  This EUA will remain in effect (meaning this test can be used) for the duration of the COVID-19 declaration under Section 564(b)(1) of the Act, 21 U.S.C. section 360bbb-3(b)(1), unless the authorization is terminated or revoked sooner. Performed at [facility], [address] [place], [place] [address]     RADIOLOGY:  Mr Herby Abraham Contrast  Result Date: 09/13/2018 CLINICAL DATA:  Fe facial droop and right-sided numbness. Bilateral lower extremity weakness. EXAM: MRI HEAD WITHOUT CONTRAST MRA HEAD WITHOUT CONTRAST TECHNIQUE: Multiplanar, multiecho pulse sequences of the brain and surrounding structures were obtained without intravenous contrast. Angiographic images of the head were obtained using MRA technique without contrast. COMPARISON:  Head CT 09/13/2018 FINDINGS: MRI  HEAD FINDINGS BRAIN: There is no acute infarct, acute hemorrhage or mass effect. The midline structures are normal. Early confluent hyperintense T2-weighted signal of the periventricular and deep white matter, most commonly due to chronic ischemic microangiopathy. The CSF spaces are normal for age, with no hydrocephalus. Susceptibility-sensitive sequences show no chronic microhemorrhage or superficial siderosis. SKULL AND UPPER CERVICAL SPINE: The visualized skull base, calvarium, upper cervical spine and extracranial soft tissues are normal. SINUSES/ORBITS: No fluid levels or advanced mucosal thickening. No mastoid or middle ear effusion. The orbits are normal. MRA HEAD FINDINGS POSTERIOR CIRCULATION: --Vertebral arteries: Visualized portions of the V4 segments are normal. --Posterior inferior cerebellar arteries (PICA): Incompletely visualized. --Anterior inferior cerebellar arteries (AICA): Not clearly visualized, though this is not uncommon. --Basilar artery: Normal. --Superior cerebellar arteries: Normal. --Posterior cerebral arteries (PCA): Fetal origin of the left PCA, which is normal. There is multifocal severe stenosis of the right PCA with loss of antegrade flow at multiple locations along the P1 segment. ANTERIOR CIRCULATION: --Intracranial internal carotid arteries: Normal. --Anterior cerebral arteries (ACA): Normal. Both A1 segments are present. Patent anterior communicating artery (a-comm). --Middle cerebral arteries (MCA): Normal. IMPRESSION: 1. No acute intracranial abnormality. 2. Chronic ischemic microangiopathy. 3. Multifocal severe stenosis and/or short segment occlusion of the right posterior cerebral artery. Otherwise normal intracranial MRA. Electronically Signed   By: Ulyses Jarred M.D.   On: 09/13/2018 22:44   US Carotid Bilateral (at [facility] And [facility] Only)  Result Date: 09/14/2018 CLINICAL DATA:  TIA. History of hypertension, hyperlipidemia and diabetes. EXAM: BILATERAL CAROTID DUPLEX  ULTRASOUND TECHNIQUE: Pearline Cables scale imaging, color Doppler and duplex ultrasound were performed of bilateral carotid and vertebral arteries in the neck. COMPARISON:  11/21/2017 FINDINGS: Criteria: Quantification of carotid stenosis is based on velocity parameters that correlate the residual internal carotid diameter with NASCET-based stenosis levels, using the diameter of the distal internal carotid lumen as the denominator for stenosis measurement. The following velocity measurements were obtained: RIGHT ICA: 114/34 cm/sec CCA: 12/24 cm/sec SYSTOLIC ICA/CCA RATIO:  1.7 ECA: 67 cm/sec LEFT ICA: 104/35 cm/sec CCA: 82/50 cm/sec SYSTOLIC ICA/CCA RATIO:  1.4 ECA: 70 cm/sec RIGHT CAROTID ARTERY: There is a moderate amount of eccentric mixed echogenic irregular atherosclerotic plaque within the right carotid bulb (image 16), morphologically similar to the 10/2017 examination and not definitely resulting in a hemodynamically significant stenosis in the right internal carotid  artery. Borderline elevated peak systolic velocity within mid right ICA is favored to be factitiously elevated due to sampling location of turbulent flow. RIGHT VERTEBRAL ARTERY:  Antegrade Flow LEFT CAROTID ARTERY: There is a very minimal amount of intimal thickening within the left carotid bulb (image 48), similar to the 10/2017 examination and again not resulting in elevated peak systolic velocities within the interrogated course the left internal carotid artery to suggest a hemodynamically significant stenosis LEFT VERTEBRAL ARTERY:  Antegrade Flow IMPRESSION: 1. Moderate amount of right-sided atherosclerotic plaque, morphologically similar to the 10/2017 examination, and again not definitely resulting in a hemodynamically significant stenosis. 2. Minimal amount of left-sided intimal thickening, unchanged compared to the 10/2017 examination and again not resulting in a hemodynamically significant stenosis. Electronically Signed   By: Sandi Mariscal M.D.    On: 09/14/2018 09:48   Dg Chest Port 1 View  Result Date: 09/13/2018 CLINICAL DATA:  Acute onset patient root today. EXAM: PORTABLE CHEST 1 VIEW COMPARISON:  CT chest and single view of the chest 08/21/2018. FINDINGS: Lungs clear. Heart size normal. No pneumothorax or pleural fluid. No acute or focal bony abnormality. Bilateral shoulder replacements and surgical clips left axilla noted. IMPRESSION: No acute disease. Electronically Signed   By: Inge Rise M.D.   On: 09/13/2018 21:18   Mr Jodene Nam Head/brain WS Cm  Result Date: 09/13/2018 CLINICAL DATA:  Fe facial droop and right-sided numbness. Bilateral lower extremity weakness. EXAM: MRI HEAD WITHOUT CONTRAST MRA HEAD WITHOUT CONTRAST TECHNIQUE: Multiplanar, multiecho pulse sequences of the brain and surrounding structures were obtained without intravenous contrast. Angiographic images of the head were obtained using MRA technique without contrast. COMPARISON:  Head CT 09/13/2018 FINDINGS: MRI HEAD FINDINGS BRAIN: There is no acute infarct, acute hemorrhage or mass effect. The midline structures are normal. Early confluent hyperintense T2-weighted signal of the periventricular and deep white matter, most commonly due to chronic ischemic microangiopathy. The CSF spaces are normal for age, with no hydrocephalus. Susceptibility-sensitive sequences show no chronic microhemorrhage or superficial siderosis. SKULL AND UPPER CERVICAL SPINE: The visualized skull base, calvarium, upper cervical spine and extracranial soft tissues are normal. SINUSES/ORBITS: No fluid levels or advanced mucosal thickening. No mastoid or middle ear effusion. The orbits are normal. MRA HEAD FINDINGS POSTERIOR CIRCULATION: --Vertebral arteries: Visualized portions of the V4 segments are normal. --Posterior inferior cerebellar arteries (PICA): Incompletely visualized. --Anterior inferior cerebellar arteries (AICA): Not clearly visualized, though this is not uncommon. --Basilar artery:  Normal. --Superior cerebellar arteries: Normal. --Posterior cerebral arteries (PCA): Fetal origin of the left PCA, which is normal. There is multifocal severe stenosis of the right PCA with loss of antegrade flow at multiple locations along the P1 segment. ANTERIOR CIRCULATION: --Intracranial internal carotid arteries: Normal. --Anterior cerebral arteries (ACA): Normal. Both A1 segments are present. Patent anterior communicating artery (a-comm). --Middle cerebral arteries (MCA): Normal. IMPRESSION: 1. No acute intracranial abnormality. 2. Chronic ischemic microangiopathy. 3. Multifocal severe stenosis and/or short segment occlusion of the right posterior cerebral artery. Otherwise normal intracranial MRA. Electronically Signed   By: Ulyses Jarred M.D.   On: 09/13/2018 22:44   Ct Head Code Stroke Wo Contrast  Result Date: 09/13/2018 CLINICAL DATA:  Code stroke. Initial evaluation for bilateral leg weakness, facial droop. EXAM: CT HEAD WITHOUT CONTRAST TECHNIQUE: Contiguous axial images were obtained from the base of the skull through the vertex without intravenous contrast. COMPARISON:  Prior CT from 05/08/2018. FINDINGS: Brain: Generalized age-related cerebral atrophy with moderate chronic microvascular ischemic disease. Small remote lacunar infarct  present at the right lentiform nucleus. No acute intracranial hemorrhage. No acute large vessel territory infarct. No mass lesion, midline shift or mass effect. No hydrocephalus. No extra-axial fluid collection. Vascular: No hyperdense vessel. Scattered vascular calcifications noted within the carotid siphons. Skull: Scalp soft tissues and calvarium within normal limits. Sinuses/Orbits: Globes orbital soft tissues normal. Chronic right maxillary sinusitis. Paranasal sinuses are otherwise clear. No mastoid effusion. Other: None. ASPECTS [facility] Stroke Program Early CT Score) - Ganglionic level infarction (caudate, lentiform nuclei, internal capsule, insula, M1-M3  cortex): 7 - Supraganglionic infarction (M4-M6 cortex): 3 Total score (0-10 with 10 being normal): 10 IMPRESSION: 1. No acute intracranial infarct or other abnormality identified. 2. ASPECTS is 10. 3. Generalized age-related cerebral atrophy with moderate chronic small vessel ischemic disease, with superimposed small remote lacunar infarct involving the right lentiform nucleus. 4. Chronic right maxillary sinusitis. Critical Value/emergent results were called by telephone at the time of interpretation on 09/13/2018 at 5:18 pm to Dr. Lenise Arena , who verbally acknowledged these results. Electronically Signed   By: Jeannine Boga M.D.   On: 09/13/2018 17:19     CODE STATUS:     Code Status Orders  (From admission, onward)         Start     Ordered   09/13/18 2023  Full code  Continuous     09/13/18 2023        Code Status History    Date Active Date Inactive Code Status Order ID Comments User Context   08/22/2018 0023 08/22/2018 1750 Full Code [id number]  Lance Coon, MD ED   05/08/2018 0443 05/11/2018 2055 Full Code [id number]  Rise Patience, MD ED   06/14/2016 0244 06/16/2016 1520 Full Code [id number]  Norval Morton, MD Inpatient   04/26/2012 2006 04/29/2012 1806 Full Code [id number]  Louis Meckel, RN Inpatient      TOTAL TIME TAKING CARE OF THIS PATIENT: *40* minutes.    Fritzi Mandes M.D on 09/14/2018 at 1:01 PM  Between 7am to 6pm - Pager - [phone] After 6pm go to [email or web address] - Proofreader  [facility]  Office  [phone]  CC: Primary care physician; Pleas Koch, NP

## 2018-09-14 NOTE — TOC Transition Note (Signed)
Transition of Care [facility]) - CM/SW Discharge Note   Patient Details  Name: Shannon Higgins MRN: [id number] Date of Birth: 02-Nov-1934  Transition of Care [facility]) CM/SW Contact:  Annamaria Boots, [place] Phone Number: 09/14/2018, 1:31 PM   Clinical Narrative:   Patient is medically stable for transport today. Patient was already followed by [facility] for PT. Patient will continue with home health PT through [facility]. CSW notified Corene Cornea at [place].     Final next level of care: Home w Home Health Services Barriers to Discharge: No Barriers Identified   Patient Goals and CMS Choice   CMS Medicare.gov Compare Post Acute Care list provided to:: Patient Choice offered to / list presented to : Patient  Discharge Placement                       Discharge Plan and Services                          HH Arranged: PT [facility] Agency: [place] ([facility]) Date [place]: 09/14/18 Time [place]: [place] Representative spoke with at [place]: [place] ([place]) Interventions     Readmission Risk Interventions No flowsheet data found.

## 2018-09-14 NOTE — Progress Notes (Signed)
Pt is being discharged home. Discharge papers given and explained to pt. Pt verbalized understanding. Meds and f/u appointment reviewed. Rx to be picked up from pharmacy. Pt made aware.  Awaiting transportation.  Called pt's daughter, [name] to educate about AV[facility]. Daughter verbalized understanding.

## 2018-09-14 NOTE — Progress Notes (Signed)
SLP Cancellation Note  Patient Details Name: LYDA COLCORD MRN: [id number] DOB: 05-28-1934   Cancelled treatment:       Reason Eval/Treat Not Completed: SLP screened, no needs identified, will sign off(chart reviewed; consulted NSG then met w/ pt).  Pt denied any difficulty swallowing and is currently on a regular diet finishing her Lunch meal; tolerates swallowing pills w/ water per NSG. Pt conversed at conversational level w/out deficits noted; pt and NSG denied any speech-language deficits in general conversation re: self. No gross facial droop is noted currently. No further skilled ST services indicated as pt appears at her baseline. Noted MRI results. Pt agreed. NSG to reconsult if any change in status.     Orinda Kenner, [place], CCC-SLP Aleenah Homen 09/14/2018, 12:21 PM

## 2018-09-14 NOTE — Progress Notes (Signed)
*  PRELIMINARY RESULTS* Echocardiogram 2D Echocardiogram has been performed.  Shannon Higgins M Tamim Skog 09/14/2018, 10:28 AM 

## 2018-09-14 NOTE — Evaluation (Signed)
Occupational Therapy Evaluation Patient Details Name: Shannon Higgins MRN: [id number] DOB: 10/15/1934 Today's Date: 09/14/2018    History of Present Illness 84 yo Female came to ED with right side facial drooping and confusion; She was worked up for TIA and possible CVA. MRI/MRA negative for acute abnormality but does show chronic ischemia; PMH significant for vascular dementia, Hx of CVA, hypothyroidism, HTN, type 2 diabetes   Clinical Impression   Pt seen for OT evaluation this date. Prior to hospital admission, pt was generally independent in basic ADL tasks (assist for shower set up from daughter), using a RW for majority of mobility, and daughter/son in law assisting with med mgt, meals, housekeeping, and transportation. Pt repots 1 fall in January 2/2 "blood pressure issue." Pt lives with her daughter and son in law in a 2 story townhome with 6-8 steps to enter with no hand rail, but pt reports her family is "working on it." Currently pt demonstrates impairments in R sided facial and R foot sensation, strength generally (BLE>BUE), and impaired balance (at baseline) requiring increased assist for ADL and functional mobility (Sup-CGA) with occasional verbal cues for safety. Pt instructed in falls prevention strategies and s/s orthostatic hypotension as well as functional transfer training for ADL tasks to improve safety/independence. Pt would benefit from skilled OT services to maximize safety/indep in the home, minimize future falls risk, and minimize risk of readmission and/or increased caregiver burden.     Follow Up Recommendations  No OT follow up;Supervision/Assistance - 24 hour    Equipment Recommendations  None recommended by OT    Recommendations for Other Services       Precautions / Restrictions Precautions Precautions: Fall Restrictions Weight Bearing Restrictions: No      Mobility Bed Mobility Overal bed mobility: Needs Assistance Bed Mobility: Supine to Sit;Sit to  Supine     Supine to sit: Supervision;HOB elevated Sit to supine: Supervision      Transfers Overall transfer level: Needs assistance Equipment used: Rolling walker (2 wheeled) Transfers: Sit to/from Stand Sit to Stand: Min guard;Min assist         General transfer comment: instruction provided in scooting EOB more and hand/foot placement to improve transfer technique    Balance Overall balance assessment: Needs assistance Sitting-balance support: No upper extremity supported;Feet supported Sitting balance-Leahy Scale: Good     Standing balance support: Bilateral upper extremity supported Standing balance-Leahy Scale: Poor Standing balance comment: uses RW for stability in standing, requires CGA for safety with dynamic walking;                            ADL either performed or assessed with clinical judgement   ADL Overall ADL's : Needs assistance/impaired                                       General ADL Comments: Sup-CGA for STS transfers during ADL tasks; near baseline     Vision Baseline Vision/History: Wears glasses Wears Glasses: Reading only Patient Visual Report: No change from baseline Vision Assessment?: No apparent visual deficits     Perception     Praxis      Pertinent Vitals/Pain Pain Assessment: No/denies pain     Hand Dominance Right   Extremity/Trunk Assessment Upper Extremity Assessment Upper Extremity Assessment: Overall WFL for tasks assessed(grossly 4/5 bilaterally, intact sensation and coordination)   Lower  Extremity Assessment Lower Extremity Assessment: Defer to PT evaluation;RLE deficits/detail;LLE deficits/detail RLE Deficits / Details: decreased light touch sensation on plantar surface of foot, intact deep pressure; strength grossly 3/5 LLE Deficits / Details: intact light touch/deep pressure; strength grossly 4-/5   Cervical / Trunk Assessment Cervical / Trunk Assessment: Kyphotic    Communication Communication Communication: No difficulties   Cognition Arousal/Alertness: Awake/alert Behavior During Therapy: WFL for tasks assessed/performed Overall Cognitive Status: Within Functional Limits for tasks assessed                                 General Comments: oriented, follows all commands   General Comments      Exercises Other Exercises: pt instructed in falls prevention and s/s orthostatic hypotension and strategies to minimize risk Other Exercises: functional transfer training with RW for ADL tasks   Shoulder Instructions      Home Living Family/patient expects to be discharged to:: Private residence Living Arrangements: Children Available Help at Discharge: Family;Available PRN/intermittently;Other (Comment)(daughter home most of the day but does have to leave periodically for work) Type of Home: House Home Access: Stairs to enter Technical brewer of Steps: 6 Entrance Stairs-Rails: None Home Layout: One level     Bathroom Shower/Tub: Occupational psychologist: Handicapped height     Home Equipment: Environmental consultant - 2 wheels;Shower seat;Wheelchair - manual          Prior Functioning/Environment Level of Independence: Needs assistance  Gait / Transfers Assistance Needed: used RW for some walking but reports being able to walk short distances in home without AD ADL's / Homemaking Assistance Needed: daughter did most of cooking and cleaning and would assist in prepping for shower. Pt reports family drives and daughter assists with medication mgt Communication / Swallowing Assistance Needed: independent          OT Problem List: Impaired sensation;Impaired balance (sitting and/or standing)      OT Treatment/Interventions: Self-care/ADL training;DME and/or AE instruction;Therapeutic activities;Balance training;Therapeutic exercise;Patient/family education    OT Goals(Current goals can be found in the care plan section)  Acute Rehab OT Goals Patient Stated Goal: to go back home OT Goal Formulation: With patient Time For Goal Achievement: 09/28/18 Potential to Achieve Goals: Good  OT Frequency: Min 1X/week   Barriers to D/C:            Co-evaluation              AM-PAC OT "6 Clicks" Daily Activity     Outcome Measure Help from another person eating meals?: None Help from another person taking care of personal grooming?: None Help from another person toileting, which includes using toliet, bedpan, or urinal?: A Little Help from another person bathing (including washing, rinsing, drying)?: A Little Help from another person to put on and taking off regular upper body clothing?: None Help from another person to put on and taking off regular lower body clothing?: A Little 6 Click Score: 21   End of Session Equipment Utilized During Treatment: Gait belt;Rolling walker  Activity Tolerance: Patient tolerated treatment well Patient left: in bed;with call bell/phone within reach;with bed alarm set  OT Visit Diagnosis: Other abnormalities of gait and mobility (R26.89)                Time: 6734-1937 OT Time Calculation (min): 26 min Charges:  OT General Charges $OT Visit: 1 Visit OT Evaluation $OT Eval Low Complexity: 1 Low OT  Treatments $Therapeutic Activity: 8-22 mins  Jeni Salles, MPH, MS, OTR/L ascom [phone] 09/14/18, 11:33 AM

## 2018-09-16 DIAGNOSIS — E1140 Type 2 diabetes mellitus with diabetic neuropathy, unspecified: Secondary | ICD-10-CM | POA: Diagnosis not present

## 2018-09-16 DIAGNOSIS — Z853 Personal history of malignant neoplasm of breast: Secondary | ICD-10-CM | POA: Diagnosis not present

## 2018-09-16 DIAGNOSIS — Z8673 Personal history of transient ischemic attack (TIA), and cerebral infarction without residual deficits: Secondary | ICD-10-CM | POA: Diagnosis not present

## 2018-09-16 DIAGNOSIS — Z791 Long term (current) use of non-steroidal anti-inflammatories (NSAID): Secondary | ICD-10-CM | POA: Diagnosis not present

## 2018-09-16 DIAGNOSIS — F0150 Vascular dementia without behavioral disturbance: Secondary | ICD-10-CM | POA: Diagnosis not present

## 2018-09-16 DIAGNOSIS — Z7984 Long term (current) use of oral hypoglycemic drugs: Secondary | ICD-10-CM | POA: Diagnosis not present

## 2018-09-16 DIAGNOSIS — Z79811 Long term (current) use of aromatase inhibitors: Secondary | ICD-10-CM | POA: Diagnosis not present

## 2018-09-16 DIAGNOSIS — Z8701 Personal history of pneumonia (recurrent): Secondary | ICD-10-CM | POA: Diagnosis not present

## 2018-09-16 DIAGNOSIS — M0689 Other specified rheumatoid arthritis, multiple sites: Secondary | ICD-10-CM | POA: Diagnosis not present

## 2018-09-17 ENCOUNTER — Telehealth: Payer: Self-pay | Admitting: Primary Care

## 2018-09-17 ENCOUNTER — Telehealth: Payer: Self-pay

## 2018-09-17 NOTE — Telephone Encounter (Signed)
Attempted to reach patient to complete TCM and schedule hospital f/u appt with PCP. Attempted unsuccessful. Unable to LVMM due to voicemail is full per automated msg.

## 2018-09-17 NOTE — Telephone Encounter (Signed)
Shannon Higgins, does she really need to see you? Or cardiology?

## 2018-09-17 NOTE — Telephone Encounter (Signed)
Approved.  

## 2018-09-17 NOTE — Telephone Encounter (Signed)
Okay, let's start with me and then go from there.

## 2018-09-17 NOTE — Telephone Encounter (Signed)
[facility] with [facility] is calling to get Verbal orders for the patient to receive Physical therapy  2x weeks for 2 weeks Then 1x a week for 6 weeks    PHONE- [phone]

## 2018-09-17 NOTE — Telephone Encounter (Signed)
Saw this on the side note of the ED encounter. So that is why I asked.   Follow-Ups    1 Schedule an appointment with Pleas Koch, NP (Internal Medicine) in 1 week (09/21/2018); f/u TIA; office will call Patient w/ Appt  2 Follow up with [place] (Cardiology)

## 2018-09-17 NOTE — Telephone Encounter (Signed)
Gave the approval to [facility]

## 2018-09-17 NOTE — Telephone Encounter (Signed)
She can do another virtual visit with me.  The discharge notes don't say anything about a cardiology visit, was this recommended to her?

## 2018-09-18 DIAGNOSIS — H401133 Primary open-angle glaucoma, bilateral, severe stage: Secondary | ICD-10-CM | POA: Diagnosis not present

## 2018-09-18 DIAGNOSIS — H524 Presbyopia: Secondary | ICD-10-CM | POA: Diagnosis not present

## 2018-09-18 LAB — HM DIABETES EYE EXAM

## 2018-09-18 NOTE — Telephone Encounter (Signed)
Spoken to patient's daughter [name]) and was able to schedule the virtual appt on 09/20/2018

## 2018-09-18 NOTE — Telephone Encounter (Signed)
Attempted to reach patient for TCM. Attempt unsuccessful. Unable to leave voicemail because it is full per automated msg.

## 2018-09-18 NOTE — Telephone Encounter (Signed)
Spoken to patient's daughter [name] and schedule a virtual video appointment on 09/20/2018 at 11:40

## 2018-09-20 ENCOUNTER — Encounter: Payer: Self-pay | Admitting: Primary Care

## 2018-09-20 ENCOUNTER — Ambulatory Visit (INDEPENDENT_AMBULATORY_CARE_PROVIDER_SITE_OTHER): Payer: Medicare HMO | Admitting: Primary Care

## 2018-09-20 DIAGNOSIS — M5440 Lumbago with sciatica, unspecified side: Secondary | ICD-10-CM | POA: Diagnosis not present

## 2018-09-20 DIAGNOSIS — F0150 Vascular dementia without behavioral disturbance: Secondary | ICD-10-CM | POA: Diagnosis not present

## 2018-09-20 DIAGNOSIS — Z09 Encounter for follow-up examination after completed treatment for conditions other than malignant neoplasm: Secondary | ICD-10-CM

## 2018-09-20 DIAGNOSIS — Z853 Personal history of malignant neoplasm of breast: Secondary | ICD-10-CM | POA: Diagnosis not present

## 2018-09-20 DIAGNOSIS — E1142 Type 2 diabetes mellitus with diabetic polyneuropathy: Secondary | ICD-10-CM

## 2018-09-20 DIAGNOSIS — M0689 Other specified rheumatoid arthritis, multiple sites: Secondary | ICD-10-CM | POA: Diagnosis not present

## 2018-09-20 DIAGNOSIS — Z79811 Long term (current) use of aromatase inhibitors: Secondary | ICD-10-CM | POA: Diagnosis not present

## 2018-09-20 DIAGNOSIS — Z8673 Personal history of transient ischemic attack (TIA), and cerebral infarction without residual deficits: Secondary | ICD-10-CM | POA: Diagnosis not present

## 2018-09-20 DIAGNOSIS — G8929 Other chronic pain: Secondary | ICD-10-CM

## 2018-09-20 DIAGNOSIS — Z7984 Long term (current) use of oral hypoglycemic drugs: Secondary | ICD-10-CM | POA: Diagnosis not present

## 2018-09-20 DIAGNOSIS — E1140 Type 2 diabetes mellitus with diabetic neuropathy, unspecified: Secondary | ICD-10-CM | POA: Diagnosis not present

## 2018-09-20 DIAGNOSIS — G459 Transient cerebral ischemic attack, unspecified: Secondary | ICD-10-CM | POA: Diagnosis not present

## 2018-09-20 DIAGNOSIS — Z8701 Personal history of pneumonia (recurrent): Secondary | ICD-10-CM | POA: Diagnosis not present

## 2018-09-20 DIAGNOSIS — Z791 Long term (current) use of non-steroidal anti-inflammatories (NSAID): Secondary | ICD-10-CM | POA: Diagnosis not present

## 2018-09-20 MED ORDER — TIZANIDINE HCL 4 MG PO TABS: 4.0000 mg | ORAL_TABLET | Freq: Two times a day (BID) | ORAL | 0 refills | Status: DC | PRN

## 2018-09-20 NOTE — Assessment & Plan Note (Signed)
Acute on chronic flare without radiculopathy. No alarm signs. Given that she noted improvement with her daughter's muscle relaxer, we will provide her with a low dose Tizanidine to use PRN, drowsiness precautions provided.

## 2018-09-20 NOTE — Assessment & Plan Note (Signed)
A1C stable from recent hospital stay.

## 2018-09-20 NOTE — Assessment & Plan Note (Signed)
Admitted for stroke like symptoms. Stroke work up overall negative, carotid artery studies stable. Diagnosed with TIA. Doing well since. Will address acute on chronic back pain, see other notes. Continue with home PT. Continue aspirin and statin therapy.  Hospital notes, labs, imaging reviewed

## 2018-09-20 NOTE — Progress Notes (Signed)
Subjective:    Patient ID: Shannon Higgins, female    DOB: [birth year], 84 y.o.   MRN: [id number]  HPI  Virtual Visit via Video Note  I connected with Ovid Curd on 09/20/18 at 11:40 AM EDT by a video enabled telemedicine application and verified that I am speaking with the correct person using two identifiers.  Location: Patient: home Provider: office   I discussed the limitations of evaluation and management by telemedicine and the availability of in person appointments. The patient expressed understanding and agreed to proceed.  History of Present Illness:  Shannon Higgins is an 84 year old female with a history of hypertension, TIA, OSA, CAP, Type 2 diabetes, Hypothyroidism, Chronic back pain, recurrent UTI, Depression, AKI, Anemia who presents today for [facility] Follow up.  She presented to [facility] ED on 09/13/18 with a chief complaint of facial droop to the right side along with generalized weakness, confusion per family, and mild headache. Her weakness began the day prior and facial drooping began several hours prior to her visit. Given her symptoms Code Stroke was activated. CT head was negative for acute stroke so she was admitted for further evaluation.  During her hospital stay she underwent MRI of the brain which was negative for acute CVA. She underwent bilateral carotid dopplers which showed stable plaque buildup from prior study in July 2019. She underwent evaluation and treatment per PT and OT. Symptoms were suspected to be caused by TIA so she was discharged home the following day, 09/14/18.  Since her discharge she has been doing well. She denies facial drooping, lower extremity weakness, headache. She was visited by home health physical therapy earlier this week, they plan to return today. Her main concern now is acute on chronic back pain that began several days ago. Her daughter gave her one of her personal muscle relaxer medications and she's noticed improvement. She  denies urinary frequency, foul smelling urine, dysuria. Her daughter denies changes in mental status.    Observations/Objective:  Alert and oriented. Appears well, not sickly. No distress. Speaking in complete sentences.  Assessment and Plan:  Admitted for stroke like symptoms. Stroke work up overall negative, carotid artery studies stable. Diagnosed with TIA. Doing well since. Will address acute on chronic back pain, see other notes. Continue with home PT. Continue aspirin and statin therapy.  Hospital notes, labs, imaging reviewed.  Follow Up Instructions:  You may take the Tizanidine twice daily as needed for back pain/muscle spasms. Be careful as they may cause drowsiness.  Continue with home physical therapy.  It was a pleasure to see you today! Allie Bossier, NP-C    I discussed the assessment and treatment plan with the patient. The patient was provided an opportunity to ask questions and all were answered. The patient agreed with the plan and demonstrated an understanding of the instructions.   The patient was advised to call back or seek an in-person evaluation if the symptoms worsen or if the condition fails to improve as anticipated.     Pleas Koch, NP    Review of Systems  Eyes: Negative for visual disturbance.  Respiratory: Negative for shortness of breath.   Cardiovascular: Negative for chest pain.  Musculoskeletal: Positive for back pain.  Neurological: Negative for dizziness, weakness, numbness and headaches.       Past Medical History:  Diagnosis Date  . Asthma   . Breast cancer ([place])   . Chronic sinusitis   . Diabetes mellitus without complication (  HCC)   . GERD (gastroesophageal reflux disease)   . History of recurrent UTIs   . Hypertension   . Insomnia   . Migraine   . Neuropathic pain   . Pneumonia   . Rheumatoid arthritis ([place])   . Sleep apnea   . Stroke ([place])   . Thyroid disease      Social History   Socioeconomic  History  . Marital status: Widowed    Spouse name: Not on file  . Number of children: Not on file  . Years of education: Not on file  . Highest education level: Not on file  Occupational History  . Not on file  Social Needs  . Financial resource strain: Not hard at all  . Food insecurity:    Worry: Never true    Inability: Never true  . Transportation needs:    Medical: No    Non-medical: No  Tobacco Use  . Smoking status: Never Smoker  . Smokeless tobacco: Never Used  Substance and Sexual Activity  . Alcohol use: No  . Drug use: No  . Sexual activity: Not on file  Lifestyle  . Physical activity:    Days per week: 0 days    Minutes per session: 0 min  . Stress: Not at all  Relationships  . Social connections:    Talks on phone: More than three times a week    Gets together: Never    Attends religious service: More than 4 times per year    Active member of club or organization: No    Attends meetings of clubs or organizations: Never    Relationship status: Widowed  . Intimate partner violence:    Fear of current or ex partner: No    Emotionally abused: No    Physically abused: No    Forced sexual activity: No  Other Topics Concern  . Not on file  Social History Narrative   Pt lives in 1 story home with her daughter, [name] and [name]'s husband   Has 2 adult daughters   Highest level of education: some college   Worked mainly as Web designer.    Past Surgical History:  Procedure Laterality Date  . ABDOMINAL HYSTERECTOMY    . BACK SURGERY    . BREAST LUMPECTOMY    . CHOLECYSTECTOMY    . KNEE SURGERY    . SHOULDER SURGERY      Family History  Problem Relation Age of Onset  . Diabetes Daughter   . Hypertension Daughter   . Fibromyalgia Daughter   . GER disease Daughter   . Fibromyalgia Daughter   . Crohn's disease Daughter   . Asthma Daughter   . Hypertension Mother     No Known Allergies  Current Outpatient Medications on File Prior to Visit   Medication Sig Dispense Refill  . albuterol (VENTOLIN HFA) 108 (90 Base) MCG/ACT inhaler Inhale 2 puffs into the lungs every 4 (four) hours as needed for shortness of breath. 1 Inhaler 0  . anastrozole (ARIMIDEX) 1 MG tablet Take 1 mg by mouth daily.    [name] aspirin 325 MG tablet Take 1 tablet (325 mg total) by mouth daily. 30 tablet 1  . blood glucose meter kit and supplies Dispense based on patient and insurance preference. Use up to 2 times daily as directed. (FOR ICD-10 E10.9, E11.9). 1 each 0  . brimonidine (ALPHAGAN) 0.2 % ophthalmic solution Place 1 drop into both eyes 3 (three) times daily. Both eyes     .  cetirizine (ZYRTEC) 10 MG chewable tablet Chew 1 tablet by mouth once daily as needed for allergies. 90 tablet 0  . donepezil (ARICEPT) 10 MG tablet Take 1 tablet daily (Patient taking differently: Take 10 mg by mouth daily. ) 90 tablet 3  . dorzolamide-timolol (COSOPT) 22.3-6.8 MG/ML ophthalmic solution Place 1 drop into both eyes 2 (two) times daily.    . DULoxetine (CYMBALTA) 60 MG capsule Take 1 capsule by mouth once daily for depression. 90 capsule 3  . fluticasone (FLONASE) 50 MCG/ACT nasal spray USE 2 SPRAYS IN EACH NOSTRIL EVERY DAY 48 g 3  . gabapentin (NEURONTIN) 800 MG tablet TAKE 1 TABLET BY MOUTH THREE TIMES A DAY 270 tablet 1  . latanoprost (XALATAN) 0.005 % ophthalmic solution Place 1 drop into both eyes at bedtime.    [name] levothyroxine (SYNTHROID, LEVOTHROID) 125 MCG tablet Take 1 tablet (125 mcg total) by mouth daily before breakfast. Take 1 tablet by mouth every morning on an empty stomach with a full glass of water. No food or other medications for 30 minutes. 90 tablet 2  . meclizine (ANTIVERT) 25 MG tablet Take 1 tablet (25 mg total) by mouth 2 (two) times daily as needed for dizziness. 180 tablet 0  . metFORMIN (GLUCOPHAGE) 500 MG tablet Take 1 tablet by mouth twice daily with food for diabetes. 180 tablet 3  . omeprazole (PRILOSEC) 20 MG capsule Take 20 mg by mouth 2  (two) times a day.    . propranolol (INDERAL) 80 MG tablet Take 1 tablet by mouth once daily for blood pressure. 90 tablet 3  . rosuvastatin (CRESTOR) 10 MG tablet Take 1 tablet by mouth once daily for cholesterol. 90 tablet 3  . SUMAtriptan (IMITREX) 25 MG tablet Take as needed for migraine. Do not take more than twice a week (Patient taking differently: Take 25 mg by mouth daily as needed for migraine. Do not take more than twice a week) 24 tablet 3  . traZODone (DESYREL) 100 MG tablet Take 2 tablets (200 mg total) by mouth at bedtime as needed for sleep. 180 tablet 1   No current facility-administered medications on file prior to visit.     There were no vitals taken for this visit.   Objective:   Physical Exam  Constitutional: She is oriented to person, place, and time. She appears well-nourished.  Respiratory: Effort normal.  Neurological: She is alert and oriented to person, place, and time.  Psychiatric: She has a normal mood and affect.           Assessment & Plan:

## 2018-09-20 NOTE — Patient Instructions (Signed)
You may take the Tizanidine twice daily as needed for back pain/muscle spasms. Be careful as they may cause drowsiness.  Continue with home physical therapy.  It was a pleasure to see you today! Allie Bossier, NP-C

## 2018-09-25 DIAGNOSIS — Z79811 Long term (current) use of aromatase inhibitors: Secondary | ICD-10-CM | POA: Diagnosis not present

## 2018-09-25 DIAGNOSIS — Z853 Personal history of malignant neoplasm of breast: Secondary | ICD-10-CM | POA: Diagnosis not present

## 2018-09-25 DIAGNOSIS — F0150 Vascular dementia without behavioral disturbance: Secondary | ICD-10-CM | POA: Diagnosis not present

## 2018-09-25 DIAGNOSIS — M0689 Other specified rheumatoid arthritis, multiple sites: Secondary | ICD-10-CM | POA: Diagnosis not present

## 2018-09-25 DIAGNOSIS — E1140 Type 2 diabetes mellitus with diabetic neuropathy, unspecified: Secondary | ICD-10-CM | POA: Diagnosis not present

## 2018-09-25 DIAGNOSIS — Z7984 Long term (current) use of oral hypoglycemic drugs: Secondary | ICD-10-CM | POA: Diagnosis not present

## 2018-09-25 DIAGNOSIS — Z8701 Personal history of pneumonia (recurrent): Secondary | ICD-10-CM | POA: Diagnosis not present

## 2018-09-25 DIAGNOSIS — Z8673 Personal history of transient ischemic attack (TIA), and cerebral infarction without residual deficits: Secondary | ICD-10-CM | POA: Diagnosis not present

## 2018-09-25 DIAGNOSIS — Z791 Long term (current) use of non-steroidal anti-inflammatories (NSAID): Secondary | ICD-10-CM | POA: Diagnosis not present

## 2018-09-26 ENCOUNTER — Encounter: Payer: Self-pay | Admitting: Neurology

## 2018-09-27 DIAGNOSIS — G4700 Insomnia, unspecified: Secondary | ICD-10-CM | POA: Insufficient documentation

## 2018-09-27 NOTE — Assessment & Plan Note (Signed)
Refill provided of Trazodone as requested. She is doing better on 200 mg. Precautions provided.

## 2018-09-28 DIAGNOSIS — Z791 Long term (current) use of non-steroidal anti-inflammatories (NSAID): Secondary | ICD-10-CM | POA: Diagnosis not present

## 2018-09-28 DIAGNOSIS — Z79811 Long term (current) use of aromatase inhibitors: Secondary | ICD-10-CM | POA: Diagnosis not present

## 2018-09-28 DIAGNOSIS — M0689 Other specified rheumatoid arthritis, multiple sites: Secondary | ICD-10-CM | POA: Diagnosis not present

## 2018-09-28 DIAGNOSIS — Z853 Personal history of malignant neoplasm of breast: Secondary | ICD-10-CM | POA: Diagnosis not present

## 2018-09-28 DIAGNOSIS — Z8701 Personal history of pneumonia (recurrent): Secondary | ICD-10-CM | POA: Diagnosis not present

## 2018-09-28 DIAGNOSIS — Z8673 Personal history of transient ischemic attack (TIA), and cerebral infarction without residual deficits: Secondary | ICD-10-CM | POA: Diagnosis not present

## 2018-09-28 DIAGNOSIS — E1140 Type 2 diabetes mellitus with diabetic neuropathy, unspecified: Secondary | ICD-10-CM | POA: Diagnosis not present

## 2018-09-28 DIAGNOSIS — F0150 Vascular dementia without behavioral disturbance: Secondary | ICD-10-CM | POA: Diagnosis not present

## 2018-09-28 DIAGNOSIS — Z7984 Long term (current) use of oral hypoglycemic drugs: Secondary | ICD-10-CM | POA: Diagnosis not present

## 2018-10-01 ENCOUNTER — Ambulatory Visit: Payer: Medicare HMO | Admitting: Neurology

## 2018-10-01 ENCOUNTER — Telehealth (INDEPENDENT_AMBULATORY_CARE_PROVIDER_SITE_OTHER): Payer: Medicare HMO | Admitting: Neurology

## 2018-10-01 ENCOUNTER — Encounter: Payer: Self-pay | Admitting: Neurology

## 2018-10-01 ENCOUNTER — Other Ambulatory Visit: Payer: Self-pay

## 2018-10-01 VITALS — BP 140/70 | Ht 64.0 in | Wt 150.0 lb

## 2018-10-01 DIAGNOSIS — G459 Transient cerebral ischemic attack, unspecified: Secondary | ICD-10-CM | POA: Diagnosis not present

## 2018-10-01 DIAGNOSIS — F03A0 Unspecified dementia, mild, without behavioral disturbance, psychotic disturbance, mood disturbance, and anxiety: Secondary | ICD-10-CM

## 2018-10-01 DIAGNOSIS — G43709 Chronic migraine without aura, not intractable, without status migrainosus: Secondary | ICD-10-CM

## 2018-10-01 DIAGNOSIS — F0390 Unspecified dementia without behavioral disturbance: Secondary | ICD-10-CM

## 2018-10-01 DIAGNOSIS — G3184 Mild cognitive impairment, so stated: Secondary | ICD-10-CM

## 2018-10-01 MED ORDER — DONEPEZIL HCL 10 MG PO TABS: ORAL_TABLET | ORAL | 3 refills | Status: DC

## 2018-10-01 MED ORDER — SUMATRIPTAN SUCCINATE 25 MG PO TABS: ORAL_TABLET | ORAL | 3 refills | Status: DC

## 2018-10-01 NOTE — Progress Notes (Signed)
Virtual Visit via Video Note The purpose of this virtual visit is to provide medical care while limiting exposure to the novel coronavirus.    Consent was obtained for video visit:  Yes.   Answered questions that patient had about telehealth interaction:  Yes.   I discussed the limitations, risks, security and privacy concerns of performing an evaluation and management service by telemedicine. I also discussed with the patient that there may be a patient responsible charge related to this service. The patient expressed understanding and agreed to proceed.  Pt location: Home Physician Location: office Name of referring provider:  Pleas Koch, NP I connected with Ovid Curd at patients initiation/request on 10/01/2018 at  1:30 PM EDT by video enabled telemedicine application and verified that I am speaking with the correct person using two identifiers. Pt MRN:  [id number] Pt DOB:  June 14, 1934 Video Participants:  Ovid Curd;  [name] (daughter)   History of Present Illness:  The patient was last seen in October 2019 for chronic migraines and memory loss. She presents today after hospital admission for TIA last 09/13/2018. She could not walk, right leg weaker than left, she was noted to be confused and had a right facial droop. She reports symptoms improved within arrival to the hospital, NIHSS was 1 for right-sided numbness. I personally reviewed MRI/MRA brain which did not show any acute changes. There was moderate chronic microvascular disease, MRA showed multifocal severe stenosis and/or short segment occlusion of the right PCA. Lipid panel showed cholesterol of 146, TG 244, LDL 52, HbA1c 6.6. Echo unremarkable. Carotid dopplers showed moderate right-sided atherosclerotic plaque, similar to 10/2017, no significant stenosis. She is taking a statin and 337m aspirin and denies any recurrence of similar symptoms. She reports headaches are a whole lot better on Botox, probably  occurring once a month. She takes prn Imitrex and has only taken a couple since her last visit. She and her daughter report memory is overall stable. She continues on Donepezil 172mdaily without side effects. She does not drive. Daughter manages medications and finances. No personality changes, paranoia or hallucinations. No falls.   History on Initial Assessment 02/08/2017: This is a [age]ear old right-handed woman with a history of hypertension, diabetes, rheumatoid arthritis, breast cancer, presenting for evaluation of chronic headaches and confusion.   1. Headaches Headaches started in her [age]sShe went for a period of time with no significant headaches for a year, then last June headaches recurred lasting for weeks at a time. She has 20 to 25 headache days a month, with nausea, upset stomach, sometimes diarrhea. She describes a lot of pressure and throbbing. She had a headache that lasted for 2 months, resolving 2 weeks ago, then this weekend headaches started back and have been ongoing for 4-5 days. She denies any visual obscurations, no photo/phonophobia. She does not usually have dizziness with the headaches, but recently has been so dizzy she had difficulty getting to the bathroom the past week, with described spinning sensation. Meclizine helps some. She has had vertigo in the past. No family history of migraines. She has tried several headache preventative medications, including amitriptyline, Topiramate. She is taking Propranolol 8069maily with no side effects. She is also on Neurontin for back pain and Cymbalta for mood.   2. Confusion. She reports word-finding difficulties for the past 6 months. Her daughter mostly describes the episodic confusion as forgetting what she had previously told the patient. She has been taking an old  meclizine prescription, but forgot that her daughter told her this information. She has not been driving for the past month due to glaucoma, denied previously getting  lost driving. After her husband passed away 1.5 years ago, she moved in with her daughter and son-in-law. They are in charge of finances. Her daughter states she forgets her medications. One time she forgot to take her Cymbalta for a week. No paranoia or hallucinations. She used to be a social butterfly but now likes to be more at home. There is a strong family history of dementia in her mother and multiple siblings.   She denies any diplopia, dysarthria/dysphagia, anosmia, or tremors. She has chronic neck and back pain and neuropathy in both feet radiating up her legs. She has a little urinary incontinence, no bowel issues. They report several falls with the dizziness. After she hit her head with fall in July, she had a bad headache for 6 weeks. She was veering to the left and went to the ER on 7/13 where head CT did not show any acute changes. She got a migraine cocktail but headache was still bad the next day.      Current Outpatient Medications on File Prior to Visit  Medication Sig Dispense Refill   albuterol (VENTOLIN HFA) 108 (90 Base) MCG/ACT inhaler Inhale 2 puffs into the lungs every 4 (four) hours as needed for shortness of breath. 1 Inhaler 0   anastrozole (ARIMIDEX) 1 MG tablet Take 1 mg by mouth daily.     aspirin 325 MG tablet Take 1 tablet (325 mg total) by mouth daily. 30 tablet 1   blood glucose meter kit and supplies Dispense based on patient and insurance preference. Use up to 2 times daily as directed. (FOR ICD-10 E10.9, E11.9). 1 each 0   cetirizine (ZYRTEC) 10 MG chewable tablet Chew 1 tablet by mouth once daily as needed for allergies. 90 tablet 0   donepezil (ARICEPT) 10 MG tablet Take 1 tablet daily (Patient taking differently: Take 10 mg by mouth daily. ) 90 tablet 3   dorzolamide-timolol (COSOPT) 22.3-6.8 MG/ML ophthalmic solution Place 1 drop into both eyes 2 (two) times daily.     DULoxetine (CYMBALTA) 60 MG capsule Take 1 capsule by mouth once daily for  depression. 90 capsule 3   fluticasone (FLONASE) 50 MCG/ACT nasal spray USE 2 SPRAYS IN EACH NOSTRIL EVERY DAY 48 g 3   gabapentin (NEURONTIN) 800 MG tablet TAKE 1 TABLET BY MOUTH THREE TIMES A DAY 270 tablet 1   latanoprost (XALATAN) 0.005 % ophthalmic solution Place 1 drop into both eyes at bedtime.     levothyroxine (SYNTHROID, LEVOTHROID) 125 MCG tablet Take 1 tablet (125 mcg total) by mouth daily before breakfast. Take 1 tablet by mouth every morning on an empty stomach with a full glass of water. No food or other medications for 30 minutes. 90 tablet 2   meclizine (ANTIVERT) 25 MG tablet Take 1 tablet (25 mg total) by mouth 2 (two) times daily as needed for dizziness. 180 tablet 0   meloxicam (MOBIC) 15 MG tablet Take 15 mg by mouth daily.     metFORMIN (GLUCOPHAGE) 500 MG tablet Take 1 tablet by mouth twice daily with food for diabetes. 180 tablet 3   omeprazole (PRILOSEC) 20 MG capsule Take 20 mg by mouth 2 (two) times a day.     propranolol (INDERAL) 80 MG tablet Take 1 tablet by mouth once daily for blood pressure. 90 tablet 3   rosuvastatin (CRESTOR)  10 MG tablet Take 1 tablet by mouth once daily for cholesterol. 90 tablet 3   SUMAtriptan (IMITREX) 25 MG tablet Take as needed for migraine. Do not take more than twice a week (Patient taking differently: Take 25 mg by mouth daily as needed for migraine. Do not take more than twice a week) 24 tablet 3   tiZANidine (ZANAFLEX) 4 MG tablet Take 1 tablet (4 mg total) by mouth 2 (two) times daily as needed for muscle spasms. 14 tablet 0   traZODone (DESYREL) 100 MG tablet Take 2 tablets (200 mg total) by mouth at bedtime as needed for sleep. 180 tablet 1   brimonidine (ALPHAGAN) 0.2 % ophthalmic solution Place 1 drop into both eyes 3 (three) times daily. Both eyes      No current facility-administered medications on file prior to visit.      Observations/Objective:   Vitals:   10/01/18 1309  BP: 140/70  Weight: 150 lb (68 kg)   Height: 5' 4"  (1.626 m)   GEN:  The patient appears stated age and is in NAD.  Neurological examination: Patient is awake, alert, oriented x 3. No aphasia or dysarthria. Intact fluency and comprehension. 5/5 WORLD backward. Remote and recent memory intact. 2/3 delayed recall. Able to name and repeat. Cranial nerves: Extraocular movements intact with no nystagmus. No facial asymmetry. Motor: moves all extremities symmetrically, at least anti-gravity x 4. No incoordination on finger to nose testing. Gait: slow and cautious, no ataxia.   Assessment and Plan:   This is an 84 yo RH woman with a history of hypertension, diabetes, rheumatoid arthritis, breast cancer, with chronic migraines and mild dementia, presenting after recent hospitalization for TIA with transient right facial droop, right-sided numbness, and leg weakness R>L. MRI brain no acute changes, there is multifocal severe stenosis of the right PCA, which would not cause her symptoms. We discussed continuation of control of vascular risk factors, daily aspirin 372m. Continue Botox for chronic migraines, she was advised to avoid Imitrex as much as possible, she has tried several preventatives and feels it is the most helpful, aware of risks. Memory stable, continue Donepezil 181mdaily. They know to go to the ER for any sudden changes in symptoms, follow-up in 6 months.     Follow Up Instructions:    -I discussed the assessment and treatment plan with the patient. The patient was provided an opportunity to ask questions and all were answered. The patient agreed with the plan and demonstrated an understanding of the instructions.   The patient was advised to call back or seek an in-person evaluation if the symptoms worsen or if the condition fails to improve as anticipated.     KaCameron SprangMD

## 2018-10-02 DIAGNOSIS — F0150 Vascular dementia without behavioral disturbance: Secondary | ICD-10-CM | POA: Diagnosis not present

## 2018-10-02 DIAGNOSIS — Z853 Personal history of malignant neoplasm of breast: Secondary | ICD-10-CM | POA: Diagnosis not present

## 2018-10-02 DIAGNOSIS — Z8701 Personal history of pneumonia (recurrent): Secondary | ICD-10-CM | POA: Diagnosis not present

## 2018-10-02 DIAGNOSIS — Z79811 Long term (current) use of aromatase inhibitors: Secondary | ICD-10-CM | POA: Diagnosis not present

## 2018-10-02 DIAGNOSIS — E1140 Type 2 diabetes mellitus with diabetic neuropathy, unspecified: Secondary | ICD-10-CM | POA: Diagnosis not present

## 2018-10-02 DIAGNOSIS — M0689 Other specified rheumatoid arthritis, multiple sites: Secondary | ICD-10-CM | POA: Diagnosis not present

## 2018-10-02 DIAGNOSIS — Z791 Long term (current) use of non-steroidal anti-inflammatories (NSAID): Secondary | ICD-10-CM | POA: Diagnosis not present

## 2018-10-02 DIAGNOSIS — Z8673 Personal history of transient ischemic attack (TIA), and cerebral infarction without residual deficits: Secondary | ICD-10-CM | POA: Diagnosis not present

## 2018-10-02 DIAGNOSIS — Z7984 Long term (current) use of oral hypoglycemic drugs: Secondary | ICD-10-CM | POA: Diagnosis not present

## 2018-10-03 DIAGNOSIS — M0689 Other specified rheumatoid arthritis, multiple sites: Secondary | ICD-10-CM | POA: Diagnosis not present

## 2018-10-03 DIAGNOSIS — Z8701 Personal history of pneumonia (recurrent): Secondary | ICD-10-CM | POA: Diagnosis not present

## 2018-10-03 DIAGNOSIS — Z853 Personal history of malignant neoplasm of breast: Secondary | ICD-10-CM | POA: Diagnosis not present

## 2018-10-03 DIAGNOSIS — Z79811 Long term (current) use of aromatase inhibitors: Secondary | ICD-10-CM | POA: Diagnosis not present

## 2018-10-03 DIAGNOSIS — Z7984 Long term (current) use of oral hypoglycemic drugs: Secondary | ICD-10-CM | POA: Diagnosis not present

## 2018-10-03 DIAGNOSIS — Z8673 Personal history of transient ischemic attack (TIA), and cerebral infarction without residual deficits: Secondary | ICD-10-CM | POA: Diagnosis not present

## 2018-10-03 DIAGNOSIS — E1140 Type 2 diabetes mellitus with diabetic neuropathy, unspecified: Secondary | ICD-10-CM | POA: Diagnosis not present

## 2018-10-03 DIAGNOSIS — Z791 Long term (current) use of non-steroidal anti-inflammatories (NSAID): Secondary | ICD-10-CM | POA: Diagnosis not present

## 2018-10-03 DIAGNOSIS — F0150 Vascular dementia without behavioral disturbance: Secondary | ICD-10-CM | POA: Diagnosis not present

## 2018-10-10 DIAGNOSIS — Z8701 Personal history of pneumonia (recurrent): Secondary | ICD-10-CM | POA: Diagnosis not present

## 2018-10-10 DIAGNOSIS — F0150 Vascular dementia without behavioral disturbance: Secondary | ICD-10-CM | POA: Diagnosis not present

## 2018-10-10 DIAGNOSIS — Z79811 Long term (current) use of aromatase inhibitors: Secondary | ICD-10-CM | POA: Diagnosis not present

## 2018-10-10 DIAGNOSIS — M0689 Other specified rheumatoid arthritis, multiple sites: Secondary | ICD-10-CM | POA: Diagnosis not present

## 2018-10-10 DIAGNOSIS — Z853 Personal history of malignant neoplasm of breast: Secondary | ICD-10-CM | POA: Diagnosis not present

## 2018-10-10 DIAGNOSIS — Z791 Long term (current) use of non-steroidal anti-inflammatories (NSAID): Secondary | ICD-10-CM | POA: Diagnosis not present

## 2018-10-10 DIAGNOSIS — Z7984 Long term (current) use of oral hypoglycemic drugs: Secondary | ICD-10-CM | POA: Diagnosis not present

## 2018-10-10 DIAGNOSIS — Z8673 Personal history of transient ischemic attack (TIA), and cerebral infarction without residual deficits: Secondary | ICD-10-CM | POA: Diagnosis not present

## 2018-10-10 DIAGNOSIS — E1140 Type 2 diabetes mellitus with diabetic neuropathy, unspecified: Secondary | ICD-10-CM | POA: Diagnosis not present

## 2018-10-15 ENCOUNTER — Ambulatory Visit: Payer: Medicare HMO | Admitting: Urology

## 2018-10-15 DIAGNOSIS — Z853 Personal history of malignant neoplasm of breast: Secondary | ICD-10-CM | POA: Diagnosis not present

## 2018-10-15 DIAGNOSIS — E1140 Type 2 diabetes mellitus with diabetic neuropathy, unspecified: Secondary | ICD-10-CM | POA: Diagnosis not present

## 2018-10-15 DIAGNOSIS — F0150 Vascular dementia without behavioral disturbance: Secondary | ICD-10-CM | POA: Diagnosis not present

## 2018-10-16 ENCOUNTER — Other Ambulatory Visit: Payer: Self-pay

## 2018-10-16 ENCOUNTER — Telehealth (INDEPENDENT_AMBULATORY_CARE_PROVIDER_SITE_OTHER): Payer: Medicare HMO | Admitting: Urology

## 2018-10-16 DIAGNOSIS — N390 Urinary tract infection, site not specified: Secondary | ICD-10-CM

## 2018-10-16 NOTE — Progress Notes (Signed)
Virtual Visit via Telephone Note  I connected with Shannon Higgins on 10/16/18 at 12:30 PM EDT by telephone and verified that I am speaking with the correct person using two identifiers.   I discussed the limitations, risks, security and privacy concerns of performing an evaluation and management service by telephone and the availability of in person appointments. We discussed the impact of the COVID-19 on the healthcare system, and the importance of social distancing and reducing patient and provider exposure. I also discussed with the patient that there may be a patient responsible charge related to this service. The patient expressed understanding and agreed to proceed.  Reason for visit: rUTIs  History of Present Illness: I had phone follow-up with Ms. Rayburn for her history of recurrent UTIs.  To briefly summarize, I first met her in March 2020 when she had had for culture documented Klebsiella and E. coli UTIs over the last year.  KUB and ultrasound showed no evidence of nephrolithiasis.  She was started on daily cranberry tablet prophylaxis at that time.  She denies any recurrent urinary infections since that time and is overall doing well.   Assessment and Plan: 84 year old female with history of for recurrent Klebsiella and E. coli UTIs over the last year, started on cranberry prophylaxis in March 2020 with no recurrence of urinary infection since that time.  Follow Up: RTC 1 year for symptom check Consider Bactrim daily prophylaxis if recurrent UTIs   I discussed the assessment and treatment plan with the patient. The patient was provided an opportunity to ask questions and all were answered. The patient agreed with the plan and demonstrated an understanding of the instructions.   The patient was advised to call back or seek an in-person evaluation if the symptoms worsen or if the condition fails to improve as anticipated.  I provided 11 minutes of non-face-to-face time during this  encounter.   Billey Co, MD

## 2018-10-18 DIAGNOSIS — Z791 Long term (current) use of non-steroidal anti-inflammatories (NSAID): Secondary | ICD-10-CM | POA: Diagnosis not present

## 2018-10-18 DIAGNOSIS — Z7984 Long term (current) use of oral hypoglycemic drugs: Secondary | ICD-10-CM | POA: Diagnosis not present

## 2018-10-18 DIAGNOSIS — Z8701 Personal history of pneumonia (recurrent): Secondary | ICD-10-CM | POA: Diagnosis not present

## 2018-10-18 DIAGNOSIS — Z79811 Long term (current) use of aromatase inhibitors: Secondary | ICD-10-CM | POA: Diagnosis not present

## 2018-10-18 DIAGNOSIS — Z853 Personal history of malignant neoplasm of breast: Secondary | ICD-10-CM | POA: Diagnosis not present

## 2018-10-18 DIAGNOSIS — M0689 Other specified rheumatoid arthritis, multiple sites: Secondary | ICD-10-CM | POA: Diagnosis not present

## 2018-10-18 DIAGNOSIS — F0150 Vascular dementia without behavioral disturbance: Secondary | ICD-10-CM | POA: Diagnosis not present

## 2018-10-18 DIAGNOSIS — E1140 Type 2 diabetes mellitus with diabetic neuropathy, unspecified: Secondary | ICD-10-CM | POA: Diagnosis not present

## 2018-10-18 DIAGNOSIS — Z8673 Personal history of transient ischemic attack (TIA), and cerebral infarction without residual deficits: Secondary | ICD-10-CM | POA: Diagnosis not present

## 2018-10-25 ENCOUNTER — Ambulatory Visit (INDEPENDENT_AMBULATORY_CARE_PROVIDER_SITE_OTHER): Payer: Medicare HMO | Admitting: Family Medicine

## 2018-10-25 ENCOUNTER — Encounter: Payer: Self-pay | Admitting: Family Medicine

## 2018-10-25 ENCOUNTER — Telehealth: Payer: Self-pay

## 2018-10-25 DIAGNOSIS — Z791 Long term (current) use of non-steroidal anti-inflammatories (NSAID): Secondary | ICD-10-CM | POA: Diagnosis not present

## 2018-10-25 DIAGNOSIS — M0689 Other specified rheumatoid arthritis, multiple sites: Secondary | ICD-10-CM | POA: Diagnosis not present

## 2018-10-25 DIAGNOSIS — E1140 Type 2 diabetes mellitus with diabetic neuropathy, unspecified: Secondary | ICD-10-CM | POA: Diagnosis not present

## 2018-10-25 DIAGNOSIS — Z853 Personal history of malignant neoplasm of breast: Secondary | ICD-10-CM | POA: Diagnosis not present

## 2018-10-25 DIAGNOSIS — Z7984 Long term (current) use of oral hypoglycemic drugs: Secondary | ICD-10-CM | POA: Diagnosis not present

## 2018-10-25 DIAGNOSIS — J019 Acute sinusitis, unspecified: Secondary | ICD-10-CM | POA: Diagnosis not present

## 2018-10-25 DIAGNOSIS — Z79811 Long term (current) use of aromatase inhibitors: Secondary | ICD-10-CM | POA: Diagnosis not present

## 2018-10-25 DIAGNOSIS — F0150 Vascular dementia without behavioral disturbance: Secondary | ICD-10-CM | POA: Diagnosis not present

## 2018-10-25 DIAGNOSIS — Z8673 Personal history of transient ischemic attack (TIA), and cerebral infarction without residual deficits: Secondary | ICD-10-CM | POA: Diagnosis not present

## 2018-10-25 DIAGNOSIS — Z8701 Personal history of pneumonia (recurrent): Secondary | ICD-10-CM | POA: Diagnosis not present

## 2018-10-25 MED ORDER — DOXYCYCLINE HYCLATE 100 MG PO TABS: 100.0000 mg | ORAL_TABLET | Freq: Two times a day (BID) | ORAL | 0 refills | Status: DC

## 2018-10-25 NOTE — Progress Notes (Signed)
Virtual visit completed through [facility]. Due to national recommendations of social distancing due to COVID-19, a virtual visit is felt to be most appropriate for this patient at this time. Reviewed limitations of a virtual visit.   Patient location: home, daughter [name] also present on call  Provider location: [facility], office If any vitals were documented, they were collected by patient at home unless specified below.    BP (!) 130/58   Pulse (!) 56   Temp 98.4 F (36.9 C)   Ht _0  (1.626 m)   Wt 150 lb (68 kg)   BMI 25.75 kg/m    CC: back pain, headache Subjective:    Patient ID: Shannon Higgins, female    DOB: 04-Dec-1934, 84 y.o.   MRN: [id number]  HPI: CYARA DEVOTO is a 84 y.o. female presenting on 10/25/2018 for Back Pain (C/o low back pain. Saw urology on 10/06/18, denies UTI. Off and on for yrs. ) and Headache (C/o HA, nasal congestion, green nasal discharge and body aches.  Sxs started yesterday. )   2-3 days of malaise, nasal congestion, rhinorrhea, cough, frontal headache, also lower back pain (chronic).  Cough somewhat productive of green looking mucous. R>L nasal congestion - opening up now.   H/o sinus surgeries in the past.  H/o hospitalization CAP 08/2018 - treated with levaquin.  She states she had diarrhea to augmentin 07/2018.   Denies fevers/chills, dyspnea, ST, abd pain, nausea. Sense of taste and smell intact.  No sick contacts at home.   Treating with zyrtec, flonase, nasal saline. Also tried some ibuprofen.  She has been tested twice for coronavirus and declines retesting.  She did get her nails done on Tuesday but otherwise has stayed at home.   She is planning on trip to visit her sister.  DM history.  Lab Results  Component Value Date   HGBA1C 6.6 (H) 09/14/2018     Oh by the way - over last 2 days knot developed on R foot increasing in size, painful.      Relevant past medical, surgical, family and social history reviewed and  updated as indicated. Interim medical history since our last visit reviewed. Allergies and medications reviewed and updated. Outpatient Medications Prior to Visit  Medication Sig Dispense Refill  . albuterol (VENTOLIN HFA) 108 (90 Base) MCG/ACT inhaler Inhale 2 puffs into the lungs every 4 (four) hours as needed for shortness of breath. 1 Inhaler 0  . anastrozole (ARIMIDEX) 1 MG tablet Take 1 mg by mouth daily.    [name] aspirin 325 MG tablet Take 1 tablet (325 mg total) by mouth daily. 30 tablet 1  . blood glucose meter kit and supplies Dispense based on patient and insurance preference. Use up to 2 times daily as directed. (FOR ICD-10 E10.9, E11.9). 1 each 0  . brimonidine (ALPHAGAN) 0.2 % ophthalmic solution Place 1 drop into both eyes 3 (three) times daily. Both eyes     . cetirizine (ZYRTEC) 10 MG chewable tablet Chew 1 tablet by mouth once daily as needed for allergies. 90 tablet 0  . donepezil (ARICEPT) 10 MG tablet Take 1 tablet daily 90 tablet 3  . dorzolamide-timolol (COSOPT) 22.3-6.8 MG/ML ophthalmic solution Place 1 drop into both eyes 2 (two) times daily.    . DULoxetine (CYMBALTA) 60 MG capsule Take 1 capsule by mouth once daily for depression. 90 capsule 3  . fluticasone (FLONASE) 50 MCG/ACT nasal spray USE 2 SPRAYS IN EACH NOSTRIL EVERY DAY  48 g 3  . gabapentin (NEURONTIN) 800 MG tablet TAKE 1 TABLET BY MOUTH THREE TIMES A DAY 270 tablet 1  . latanoprost (XALATAN) 0.005 % ophthalmic solution Place 1 drop into both eyes at bedtime.    [name] levothyroxine (SYNTHROID, LEVOTHROID) 125 MCG tablet Take 1 tablet (125 mcg total) by mouth daily before breakfast. Take 1 tablet by mouth every morning on an empty stomach with a full glass of water. No food or other medications for 30 minutes. 90 tablet 2  . meclizine (ANTIVERT) 25 MG tablet Take 1 tablet (25 mg total) by mouth 2 (two) times daily as needed for dizziness. 180 tablet 0  . meloxicam (MOBIC) 15 MG tablet Take 15 mg by mouth daily.    .  metFORMIN (GLUCOPHAGE) 500 MG tablet Take 1 tablet by mouth twice daily with food for diabetes. 180 tablet 3  . omeprazole (PRILOSEC) 20 MG capsule Take 20 mg by mouth 2 (two) times a day.    . propranolol (INDERAL) 80 MG tablet Take 1 tablet by mouth once daily for blood pressure. 90 tablet 3  . rosuvastatin (CRESTOR) 10 MG tablet Take 1 tablet by mouth once daily for cholesterol. 90 tablet 3  . SUMAtriptan (IMITREX) 25 MG tablet Take as needed for migraine. Do not take more than twice a week 24 tablet 3  . tiZANidine (ZANAFLEX) 4 MG tablet Take 1 tablet (4 mg total) by mouth 2 (two) times daily as needed for muscle spasms. 14 tablet 0  . traZODone (DESYREL) 100 MG tablet Take 2 tablets (200 mg total) by mouth at bedtime as needed for sleep. 180 tablet 1   No facility-administered medications prior to visit.      Per HPI unless specifically indicated in ROS section below Review of Systems Objective:    BP (!) 130/58   Pulse (!) 56   Temp 98.4 F (36.9 C)   Ht '5\' 4"'$  (1.626 m)   Wt 150 lb (68 kg)   BMI 25.75 kg/m   Wt Readings from Last 3 Encounters:  10/25/18 150 lb (68 kg)  10/01/18 150 lb (68 kg)  08/22/18 157 lb 11.2 oz (71.5 kg)     Physical exam: Gen: alert, NAD, not ill appearing Pulm: speaks in complete sentences without increased work of breathing Psych: normal mood, normal thought content  MSK: R lateral foot with mild soft tissue swelling without erythema, warmth or significant tenderness when daughter was pushing on it    Results for orders placed or performed in visit on 09/20/18  HM DIABETES EYE EXAM  Result Value Ref Range   HM Diabetic Eye Exam No Retinopathy No Retinopathy   Assessment & Plan:   Problem List Items Addressed This Visit    Acute sinusitis    Anticipate acute sinusitis, likely viral given short duration. Given sinus surgery and recurrent sinusitis history, sent in doxy 7d course with indications when to fill.  In setting of current Covid19  pandemic, I did recommend re-testing for coronavirus - patient declines.  Recommended against oral steroid course in h/o DM.      Relevant Medications   doxycycline (VIBRA-TABS) 100 MG tablet       Meds ordered this encounter  Medications  . doxycycline (VIBRA-TABS) 100 MG tablet    Sig: Take 1 tablet (100 mg total) by mouth 2 (two) times daily.    Dispense:  14 tablet    Refill:  0   No orders of the defined types were placed  in this encounter.   I discussed the assessment and treatment plan with the patient. The patient was provided an opportunity to ask questions and all were answered. The patient agreed with the plan and demonstrated an understanding of the instructions. The patient was advised to call back or seek an in-person evaluation if the symptoms worsen or if the condition fails to improve as anticipated.  Follow up plan: No follow-ups on file.  Ria Bush, MD

## 2018-10-25 NOTE — Telephone Encounter (Signed)
I spoke with pt; lower back pain on lt side;pt saw urologist 10/06/18 virtual visit and pt denies UTI. Pt has slight H/A,when blows nose mucus is green, and aching all over. No travel and no known exposure to covid. Pt scheduled virtual visit today at 11 AM with Dr Darnell Level.

## 2018-10-25 NOTE — Assessment & Plan Note (Addendum)
Anticipate acute sinusitis, likely viral given short duration. Given sinus surgery and recurrent sinusitis history, sent in doxy 7d course with indications when to fill.  In setting of current Covid19 pandemic, I did recommend re-testing for coronavirus - patient declines.  Recommended against oral steroid course in h/o DM.

## 2018-10-25 NOTE — Telephone Encounter (Signed)
Will see today.  

## 2018-10-25 NOTE — Telephone Encounter (Signed)
[place] Night - Client TELEPHONE ADVICE RECORD [facility] Patient Name: NAVIKA HOOPES Gender: Female DOB: 1934/12/09 Age: 84 Y 59 M 28 D Return Phone Number: [phone] (Alternate) Address: City/State/Zip: [place] [address] Client [facility] Night - Client Client Site [place] Physician Alma Friendly - NP Contact Type Call Who Is Calling Patient / Member / Family / Caregiver Call Type Triage / Clinical Relationship To Patient Self Return Phone Number [phone] (Alternate) Chief Complaint Nasal Congestion Reason for Call Symptomatic / Request for [place] states she is aching all over, some drainage and back hurting. Wants to know if the dr can call her in some prednisone. Translation No Nurse Assessment Guidelines Guideline Title Affirmed Question Affirmed Notes Nurse Date/Time (Eastern Time) Disp. Time Eilene Ghazi Time) Disposition Final User 10/24/2018 6:58:37 PM Attempt made - no message left Garnetta Buddy, RN, [place] 10/24/2018 7:19:15 PM FINAL ATTEMPT MADE - no message left Yes Garnetta Buddy, RN, [place] Comments User: Ernesta Amble, RN Date/Time Eilene Ghazi Time): 10/24/2018 7:19:38 PM VM full and cannot leave message

## 2018-10-26 ENCOUNTER — Telehealth: Payer: Self-pay | Admitting: Primary Care

## 2018-10-26 ENCOUNTER — Other Ambulatory Visit: Payer: Self-pay

## 2018-10-26 ENCOUNTER — Ambulatory Visit (INDEPENDENT_AMBULATORY_CARE_PROVIDER_SITE_OTHER): Payer: Medicare HMO | Admitting: Neurology

## 2018-10-26 DIAGNOSIS — G43709 Chronic migraine without aura, not intractable, without status migrainosus: Secondary | ICD-10-CM

## 2018-10-26 MED ORDER — ONABOTULINUMTOXINA 100 UNITS IJ SOLR
155.0000 [IU] | Freq: Once | INTRAMUSCULAR | Status: AC
  Administered 2018-10-26: 17:00:00 155 [IU] via INTRAMUSCULAR

## 2018-10-26 NOTE — Progress Notes (Signed)
[facility]   Procedure Note Botox  Attending: Dr. Tomi Likens  Preoperative Diagnosis(es): Chronic migraine  Consent obtained from: Yes Benefits discussed included, but were not limited to decreased muscle tightness, increased joint range of motion, and decreased pain.  Risk discussed included, but were not limited pain and discomfort, bleeding, bruising, excessive weakness, venous thrombosis, muscle atrophy and dysphagia.  Anticipated outcomes of the procedure as well as he risks and benefits of the alternatives to the procedure, and the roles and tasks of the personnel to be involved, were discussed with the patient, and the patient consents to the procedure and agrees to proceed. A copy of the patient medication guide was given to the patient which explains the blackbox warning.  Patients identity and treatment sites confirmed Yes  Details of Procedure: Skin was cleaned with alcohol. Prior to injection, the needle plunger was aspirated to make sure the needle was not within a blood vessel.  There was no blood retrieved on aspiration.    Following is a summary of the muscles injected  And the amount of Botulinum toxin used:  Dilution 200 units of Botox was reconstituted with 4 ml of preservative free normal saline. Time of reconstitution: At the time of the office visit (<30 minutes prior to injection)   Injections  155 total units of Botox was injected with a 30 gauge needle.  Injection Sites: L occipitalis: 15 units- 3 sites  R occiptalis: 15 units- 3 sites  L upper trapezius: 15 units- 3 sites R upper trapezius: 15 units- 3 sits          L paraspinal: 10 units- 2 sites R paraspinal: 10 units- 2 sites  Face L frontalis(2 injection sites):10 units   R frontalis(2 injection sites):10 units         L corrugator: 5 units   R corrugator: 5 units           Procerus: 5 units   L temporalis: 20 units R temporalis: 20 units   Agent:  200 units of botulinum Type A (Onobotulinum  Toxin type A) was reconstituted with 4 ml of preservative free normal saline.  Time of reconstitution: At the time of the office visit (<30 minutes prior to injection)     Total injected (Units): 155  Total wasted (Units): 45  Patient tolerated procedure well without complications.   Reinjection is anticipated in 3 months.

## 2018-10-29 MED ORDER — MELOXICAM 15 MG PO TABS: ORAL_TABLET | ORAL | 0 refills | Status: DC

## 2018-10-29 NOTE — Telephone Encounter (Signed)
[facility] - Client TELEPHONE ADVICE RECORD [facility] Patient Name: Shannon Higgins Gender: Female DOB: 1934/07/13 Age: 84 Y 49 M 30 D Return Phone Number: [phone] (Primary), [phone] (Alternate) Address: [place] [address] Client [facility] - Client Client Site Shannon Higgins Physician Alma Friendly - NP Contact Type Call Who Is Calling Patient / Member / Family / Caregiver Call Type Triage / Clinical Caller Name Cam Briggs Relationship To Patient Daughter Return Phone Number [phone] (Alternate) Chief Complaint Prescription Refill or Medication Request (non symptomatic) Reason for Call Medication Question / Request Initial Comment Caller states her mother is out of medication, Meloxicam 15 milligrams at nighttime. No symptoms. She will be out of town and would like a 30 days supply. Translation No Nurse Assessment Nurse: Chesley Noon, RN, Lattie Haw Date/Time Eilene Ghazi Time): 10/26/2018 5:08:32 PM Confirm and document reason for call. If symptomatic, describe symptoms. ---Caller states her mother takes meloxicam 15 mg and is out. She is having no new symptoms. She is not sure when she ran out but would like a 30 day supply called in for her. She is going out of town at this time. No current symptoms. Has the patient had close contact with a person known or suspected to have the novel coronavirus illness OR traveled / lives in area with major community spread (including international travel) in the last 14 days from the onset of symptoms? * If Asymptomatic, screen for exposure and travel within the last 14 days. ---Not Applicable Does the patient have any new or worsening symptoms? ---No Please document clinical information provided and list any resource used. ---Caller advised she may contact Saturday clinic for medication request or wait until Monday for full refill  of medication. She has not had medication filled in the last 6 months and is requesting a refill to be called in to a different pharmacy. Caller states her mother is asymptomatic at this time and she will call office on Monday. Nurse: Chesley Noon, RN, Lattie Haw Date/Time Eilene Ghazi Time): 10/26/2018 5:12:26 PM Please select the assessment type ---Refill Does the patient have enough medication to last until the office opens? ---Unable to obtain loaner dose from Pharmacy Does the client directives allow for assistance with medications after hours? ---Yes PLEASE NOTE: All timestamps contained within this report are represented as [place] Standard Time. CONFIDENTIALTY NOTICE: This fax transmission is intended only for the addressee. It contains information that is legally privileged, confidential or otherwise protected from use or disclosure. If you are not the intended recipient, you are strictly prohibited from reviewing, disclosing, copying using or disseminating any of this information or taking any action in reliance on or regarding this information. If you have received this fax in error, please notify us immediately by telephone so that we can arrange for its return to [place]. Phone: [phone], Toll-Free: [phone], Fax: [phone] Page: 2 of 2 Call Id: [id number] Nurse Assessment Was the medication filled within the last 6 months? ---No Additional Documentation ---[facility] [id number] Guidelines Guideline Title Affirmed Question Affirmed Notes Nurse Date/Time Eilene Ghazi Time) Disp. Time Eilene Ghazi Time) Disposition Final User 10/26/2018 5:17:38 PM Clinical Call Yes Chesley Noon, RN, Lattie Haw

## 2018-10-29 NOTE — Telephone Encounter (Signed)
[facility] as requested by daughter.

## 2018-10-29 NOTE — Addendum Note (Signed)
Addended by: Jacqualin Combes on: 10/29/2018 09:21 AM   Modules accepted: Orders

## 2018-11-06 DIAGNOSIS — Z79811 Long term (current) use of aromatase inhibitors: Secondary | ICD-10-CM | POA: Diagnosis not present

## 2018-11-06 DIAGNOSIS — Z791 Long term (current) use of non-steroidal anti-inflammatories (NSAID): Secondary | ICD-10-CM | POA: Diagnosis not present

## 2018-11-06 DIAGNOSIS — Z853 Personal history of malignant neoplasm of breast: Secondary | ICD-10-CM | POA: Diagnosis not present

## 2018-11-06 DIAGNOSIS — E1140 Type 2 diabetes mellitus with diabetic neuropathy, unspecified: Secondary | ICD-10-CM | POA: Diagnosis not present

## 2018-11-06 DIAGNOSIS — Z8701 Personal history of pneumonia (recurrent): Secondary | ICD-10-CM | POA: Diagnosis not present

## 2018-11-06 DIAGNOSIS — Z8673 Personal history of transient ischemic attack (TIA), and cerebral infarction without residual deficits: Secondary | ICD-10-CM | POA: Diagnosis not present

## 2018-11-06 DIAGNOSIS — Z7984 Long term (current) use of oral hypoglycemic drugs: Secondary | ICD-10-CM | POA: Diagnosis not present

## 2018-11-06 DIAGNOSIS — M0689 Other specified rheumatoid arthritis, multiple sites: Secondary | ICD-10-CM | POA: Diagnosis not present

## 2018-11-06 DIAGNOSIS — F0150 Vascular dementia without behavioral disturbance: Secondary | ICD-10-CM | POA: Diagnosis not present

## 2018-11-22 ENCOUNTER — Other Ambulatory Visit: Payer: Self-pay | Admitting: Primary Care

## 2018-12-09 DIAGNOSIS — S61219A Laceration without foreign body of unspecified finger without damage to nail, initial encounter: Secondary | ICD-10-CM | POA: Diagnosis not present

## 2018-12-09 DIAGNOSIS — S61411A Laceration without foreign body of right hand, initial encounter: Secondary | ICD-10-CM | POA: Diagnosis not present

## 2018-12-09 DIAGNOSIS — Z23 Encounter for immunization: Secondary | ICD-10-CM | POA: Diagnosis not present

## 2018-12-21 ENCOUNTER — Ambulatory Visit: Payer: Medicare HMO

## 2018-12-21 ENCOUNTER — Other Ambulatory Visit: Payer: Medicare HMO

## 2018-12-26 ENCOUNTER — Encounter: Payer: Medicare HMO | Admitting: Primary Care

## 2018-12-30 ENCOUNTER — Other Ambulatory Visit: Payer: Self-pay | Admitting: Primary Care

## 2018-12-30 DIAGNOSIS — E119 Type 2 diabetes mellitus without complications: Secondary | ICD-10-CM

## 2018-12-30 DIAGNOSIS — E785 Hyperlipidemia, unspecified: Secondary | ICD-10-CM

## 2019-01-01 ENCOUNTER — Other Ambulatory Visit: Payer: Self-pay | Admitting: Primary Care

## 2019-01-01 NOTE — Telephone Encounter (Signed)
Last prescribed on 12/27/2017 . Last appointment on 09/20/2018. No future appointment.

## 2019-01-01 NOTE — Telephone Encounter (Signed)
Noted.  Refill sent to pharmacy. 

## 2019-01-18 ENCOUNTER — Other Ambulatory Visit: Payer: Self-pay | Admitting: Primary Care

## 2019-01-18 DIAGNOSIS — G8929 Other chronic pain: Secondary | ICD-10-CM

## 2019-01-18 DIAGNOSIS — M792 Neuralgia and neuritis, unspecified: Secondary | ICD-10-CM

## 2019-01-25 ENCOUNTER — Ambulatory Visit (INDEPENDENT_AMBULATORY_CARE_PROVIDER_SITE_OTHER): Payer: Medicare HMO | Admitting: Neurology

## 2019-01-25 ENCOUNTER — Other Ambulatory Visit: Payer: Self-pay

## 2019-01-25 DIAGNOSIS — G43709 Chronic migraine without aura, not intractable, without status migrainosus: Secondary | ICD-10-CM

## 2019-01-25 MED ORDER — ONABOTULINUMTOXINA 100 UNITS IJ SOLR
155.0000 [IU] | Freq: Once | INTRAMUSCULAR | Status: AC
  Administered 2019-01-25: 155 [IU] via INTRAMUSCULAR

## 2019-01-25 NOTE — Addendum Note (Signed)
Addended by: Ranae Plumber on: 01/25/2019 04:01 PM   Modules accepted: Orders

## 2019-01-25 NOTE — Progress Notes (Signed)
[facility]   Procedure Note Botox  Attending: Dr. Metta Clines  Preoperative Diagnosis(es): Chronic migraine  Consent obtained from: Patient Benefits discussed included, but were not limited to decreased muscle tightness, increased joint range of motion, and decreased pain.  Risk discussed included, but were not limited pain and discomfort, bleeding, bruising, excessive weakness, venous thrombosis, muscle atrophy and dysphagia.  Anticipated outcomes of the procedure as well as he risks and benefits of the alternatives to the procedure, and the roles and tasks of the personnel to be involved, were discussed with the patient, and the patient consents to the procedure and agrees to proceed. A copy of the patient medication guide was given to the patient which explains the blackbox warning.  Patients identity and treatment sites confirmed Yes  Details of Procedure: Skin was cleaned with alcohol. Prior to injection, the needle plunger was aspirated to make sure the needle was not within a blood vessel.  There was no blood retrieved on aspiration.    Following is a summary of the muscles injected  And the amount of Botulinum toxin used:  Dilution 200 units of Botox was reconstituted with 4 ml of preservative free normal saline. Time of reconstitution: At the time of the office visit (<30 minutes prior to injection)   Injections  155 total units of Botox was injected with a 30 gauge needle.  Injection Sites: L occipitalis: 15 units- 3 sites  R occiptalis: 15 units- 3 sites  L upper trapezius: 15 units- 3 sites R upper trapezius: 15 units- 3 sits          L paraspinal: 10 units- 2 sites R paraspinal: 10 units- 2 sites  Face L frontalis(2 injection sites):10 units   R frontalis(2 injection sites):10 units         L corrugator: 5 units   R corrugator: 5 units           Procerus: 5 units   L temporalis: 20 units R temporalis: 20 units   Agent:  200 units of botulinum Type A  (Onobotulinum Toxin type A) was reconstituted with 4 ml of preservative free normal saline.  Time of reconstitution: At the time of the office visit (<30 minutes prior to injection)     Total injected (Units): 155  Total wasted (Units): 45  Patient tolerated procedure well without complications.   Reinjection is anticipated in 3 months.

## 2019-01-28 ENCOUNTER — Encounter: Payer: Self-pay | Admitting: *Deleted

## 2019-01-31 ENCOUNTER — Other Ambulatory Visit: Payer: Self-pay | Admitting: Primary Care

## 2019-02-08 ENCOUNTER — Other Ambulatory Visit: Payer: Self-pay | Admitting: Primary Care

## 2019-02-08 DIAGNOSIS — G4700 Insomnia, unspecified: Secondary | ICD-10-CM

## 2019-02-18 DIAGNOSIS — H401133 Primary open-angle glaucoma, bilateral, severe stage: Secondary | ICD-10-CM | POA: Diagnosis not present

## 2019-02-26 ENCOUNTER — Ambulatory Visit (INDEPENDENT_AMBULATORY_CARE_PROVIDER_SITE_OTHER): Payer: Medicare HMO

## 2019-02-26 DIAGNOSIS — Z0000 Encounter for general adult medical examination without abnormal findings: Secondary | ICD-10-CM

## 2019-02-26 NOTE — Patient Instructions (Signed)
Shannon Higgins , Thank you for taking time to come for your Medicare Wellness Visit. I appreciate your ongoing commitment to your health goals. Please review the following plan we discussed and let me know if I can assist you in the future.   Screening recommendations/referrals: Colonoscopy: no longer required Mammogram: no longer required Bone Density: Patient wants appointment scheduled Recommended yearly ophthalmology/optometry visit for glaucoma screening and checkup Recommended yearly dental visit for hygiene and checkup  Vaccinations: Influenza vaccine: will get at next office visit Pneumococcal vaccine: up to date, next due 06/21/2019 Tdap vaccine: up to date, completed 12/09/2018 Shingles vaccine: declined   Advanced directives: Advance directive discussed with you today. I have provided a copy for you to complete at home and have notarized. Once this is complete please bring a copy in to our office so we can scan it into your chart.  Conditions/risks identified: diabetes, hypertension  Next appointment: 03/04/2019 @ 2:20 pm    Preventive Care 82 Years and Older, Female Preventive care refers to lifestyle choices and visits with your health care provider that can promote health and wellness. What does preventive care include?  A yearly physical exam. This is also called an annual well check.  Dental exams once or twice a year.  Routine eye exams. Ask your health care provider how often you should have your eyes checked.  Personal lifestyle choices, including:  Daily care of your teeth and gums.  Regular physical activity.  Eating a healthy diet.  Avoiding tobacco and drug use.  Limiting alcohol use.  Practicing safe sex.  Taking low-dose aspirin every day.  Taking vitamin and mineral supplements as recommended by your health care provider. What happens during an annual well check? The services and screenings done by your health care provider during your annual well  check will depend on your age, overall health, lifestyle risk factors, and family history of disease. Counseling  Your health care provider may ask you questions about your:  Alcohol use.  Tobacco use.  Drug use.  Emotional well-being.  Home and relationship well-being.  Sexual activity.  Eating habits.  History of falls.  Memory and ability to understand (cognition).  Work and work Statistician.  Reproductive health. Screening  You may have the following tests or measurements:  Height, weight, and BMI.  Blood pressure.  Lipid and cholesterol levels. These may be checked every 5 years, or more frequently if you are over 64 years old.  Skin check.  Lung cancer screening. You may have this screening every year starting at age 68 if you have a 30-pack-year history of smoking and currently smoke or have quit within the past 15 years.  Fecal occult blood test (FOBT) of the stool. You may have this test every year starting at age 68.  Flexible sigmoidoscopy or colonoscopy. You may have a sigmoidoscopy every 5 years or a colonoscopy every 10 years starting at age 34.  Hepatitis C blood test.  Hepatitis B blood test.  Sexually transmitted disease (STD) testing.  Diabetes screening. This is done by checking your blood sugar (glucose) after you have not eaten for a while (fasting). You may have this done every 1-3 years.  Bone density scan. This is done to screen for osteoporosis. You may have this done starting at age 10.  Mammogram. This may be done every 1-2 years. Talk to your health care provider about how often you should have regular mammograms. Talk with your health care provider about your test results, treatment  options, and if necessary, the need for more tests. Vaccines  Your health care provider may recommend certain vaccines, such as:  Influenza vaccine. This is recommended every year.  Tetanus, diphtheria, and acellular pertussis (Tdap, Td) vaccine. You  may need a Td booster every 10 years.  Zoster vaccine. You may need this after age 61.  Pneumococcal 13-valent conjugate (PCV13) vaccine. One dose is recommended after age 8.  Pneumococcal polysaccharide (PPSV23) vaccine. One dose is recommended after age 38. Talk to your health care provider about which screenings and vaccines you need and how often you need them. This information is not intended to replace advice given to you by your health care provider. Make sure you discuss any questions you have with your health care provider. Document Released: 05/15/2015 Document Revised: 01/06/2016 Document Reviewed: 02/17/2015 Elsevier Interactive Patient Education  2017 [place] Prevention in the Home Falls can cause injuries. They can happen to people of all ages. There are many things you can do to make your home safe and to help prevent falls. What can I do on the outside of my home?  Regularly fix the edges of walkways and driveways and fix any cracks.  Remove anything that might make you trip as you walk through a door, such as a raised step or threshold.  Trim any bushes or trees on the path to your home.  Use bright outdoor lighting.  Clear any walking paths of anything that might make someone trip, such as rocks or tools.  Regularly check to see if handrails are loose or broken. Make sure that both sides of any steps have handrails.  Any raised decks and porches should have guardrails on the edges.  Have any leaves, snow, or ice cleared regularly.  Use sand or salt on walking paths during winter.  Clean up any spills in your garage right away. This includes oil or grease spills. What can I do in the bathroom?  Use night lights.  Install grab bars by the toilet and in the tub and shower. Do not use towel bars as grab bars.  Use non-skid mats or decals in the tub or shower.  If you need to sit down in the shower, use a plastic, non-slip stool.  Keep the floor  dry. Clean up any water that spills on the floor as soon as it happens.  Remove soap buildup in the tub or shower regularly.  Attach bath mats securely with double-sided non-slip rug tape.  Do not have throw rugs and other things on the floor that can make you trip. What can I do in the bedroom?  Use night lights.  Make sure that you have a light by your bed that is easy to reach.  Do not use any sheets or blankets that are too big for your bed. They should not hang down onto the floor.  Have a firm chair that has side arms. You can use this for support while you get dressed.  Do not have throw rugs and other things on the floor that can make you trip. What can I do in the kitchen?  Clean up any spills right away.  Avoid walking on wet floors.  Keep items that you use a lot in easy-to-reach places.  If you need to reach something above you, use a strong step stool that has a grab bar.  Keep electrical cords out of the way.  Do not use floor polish or wax that makes floors slippery.  If you must use wax, use non-skid floor wax.  Do not have throw rugs and other things on the floor that can make you trip. What can I do with my stairs?  Do not leave any items on the stairs.  Make sure that there are handrails on both sides of the stairs and use them. Fix handrails that are broken or loose. Make sure that handrails are as long as the stairways.  Check any carpeting to make sure that it is firmly attached to the stairs. Fix any carpet that is loose or worn.  Avoid having throw rugs at the top or bottom of the stairs. If you do have throw rugs, attach them to the floor with carpet tape.  Make sure that you have a light switch at the top of the stairs and the bottom of the stairs. If you do not have them, ask someone to add them for you. What else can I do to help prevent falls?  Wear shoes that:  Do not have high heels.  Have rubber bottoms.  Are comfortable and fit you  well.  Are closed at the toe. Do not wear sandals.  If you use a stepladder:  Make sure that it is fully opened. Do not climb a closed stepladder.  Make sure that both sides of the stepladder are locked into place.  Ask someone to hold it for you, if possible.  Clearly mark and make sure that you can see:  Any grab bars or handrails.  First and last steps.  Where the edge of each step is.  Use tools that help you move around (mobility aids) if they are needed. These include:  Canes.  Walkers.  Scooters.  Crutches.  Turn on the lights when you go into a dark area. Replace any light bulbs as soon as they burn out.  Set up your furniture so you have a clear path. Avoid moving your furniture around.  If any of your floors are uneven, fix them.  If there are any pets around you, be aware of where they are.  Review your medicines with your doctor. Some medicines can make you feel dizzy. This can increase your chance of falling. Ask your doctor what other things that you can do to help prevent falls. This information is not intended to replace advice given to you by your health care provider. Make sure you discuss any questions you have with your health care provider. Document Released: 02/12/2009 Document Revised: 09/24/2015 Document Reviewed: 05/23/2014 Elsevier Interactive Patient Education  2017 [employer].

## 2019-02-26 NOTE — Progress Notes (Signed)
PCP notes:  Health Maintenance: Needs flu vaccine Patient wants Dexa scan scheduled Advised to check with pharmacy about Shingrix    Abnormal Screenings: none    Patient concerns: none    Nurse concerns: none    Next PCP appt.: 03/04/2019 @ 2:20 pm

## 2019-02-26 NOTE — Progress Notes (Signed)
Subjective:   MAIRELY FOXWORTH is a 84 y.o. female who presents for an Initial Medicare Annual Wellness Visit.  Review of Systems      This visit is being conducted through telemedicine via telephone at the nurse health advisor's home address due to the COVID-19 pandemic. This patient has given me verbal consent via doximity to conduct this visit, patient states they are participating from their home address. Patient and myself are on the telephone call. There is no referral for this visit. Some vital signs may be absent or patient reported.    Patient identification: identified by name, DOB, and current address    Cardiac Risk Factors include: advanced age (>5mn, >>21women);diabetes mellitus;hypertension     Objective:    Today's Vitals   There is no height or weight on file to calculate BMI.  Advanced Directives 02/26/2019 10/01/2018 09/13/2018 09/13/2018 08/22/2018 08/21/2018 05/08/2018  Does Patient Have a Medical Advance Directive? No Yes _0   Would patient like information on creating a medical advance directive? No - Patient declined - No - Patient declined - Yes (Inpatient - patient defers creating a medical advance directive at this time - Information given) No - Patient declined Yes (Inpatient - patient requests chaplain consult to create a medical advance directive)    Current Medications (verified) Outpatient Encounter Medications as of 02/26/2019  Medication Sig  . albuterol (VENTOLIN HFA) 108 (90 Base) MCG/ACT inhaler Inhale 2 puffs into the lungs every 4 (four) hours as needed for shortness of breath.  . anastrozole (ARIMIDEX) 1 MG tablet Take 1 mg by mouth daily.  .Marland Kitchenaspirin 325 MG tablet Take 1 tablet (325 mg total) by mouth daily.  . blood glucose meter kit and supplies Dispense based on patient and insurance preference. Use up to 2 times daily as directed. (FOR ICD-10 E10.9, E11.9).  . brimonidine (ALPHAGAN) 0.2 % ophthalmic solution Place 1 drop into both  eyes 3 (three) times daily. Both eyes   . cetirizine (ZYRTEC) 10 MG chewable tablet Chew 1 tablet by mouth once daily as needed for allergies.  .Marland Kitchendonepezil (ARICEPT) 10 MG tablet Take 1 tablet daily  . dorzolamide-timolol (COSOPT) 22.3-6.8 MG/ML ophthalmic solution Place 1 drop into both eyes 2 (two) times daily.  . DULoxetine (CYMBALTA) 60 MG capsule TAKE 1 CAPSULE EVERY DAY  FOR  DEPRESSION  . fluticasone (FLONASE) 50 MCG/ACT nasal spray USE 2 SPRAYS IN EACH NOSTRIL EVERY DAY  . gabapentin (NEURONTIN) 800 MG tablet TAKE 1 TABLET BY MOUTH THREE TIMES A DAY  . latanoprost (XALATAN) 0.005 % ophthalmic solution Place 1 drop into both eyes at bedtime.  .Marland Kitchenlevothyroxine (SYNTHROID, LEVOTHROID) 125 MCG tablet Take 1 tablet (125 mcg total) by mouth daily before breakfast. Take 1 tablet by mouth every morning on an empty stomach with a full glass of water. No food or other medications for 30 minutes.  . meclizine (ANTIVERT) 25 MG tablet Take 1 tablet (25 mg total) by mouth 2 (two) times daily as needed for dizziness.  . meloxicam (MOBIC) 15 MG tablet TAKE 1 TABLET EVERY DAY AS NEEDED FOR PAIN  . metFORMIN (GLUCOPHAGE) 500 MG tablet TAKE 1 TABLET BY MOUTH TWICE DAILY WITH FOOD FOR DIABETES.  .Marland Kitchenomeprazole (PRILOSEC) 20 MG capsule TAKE 1 CAPSULE BY MOUTH TWICE DAILY FOR HEARTBURN.  .Marland Kitchenpropranolol (INDERAL) 80 MG tablet TAKE 1 TABLET BY MOUTH ONCE DAILY FOR BLOOD PRESSURE.  . rosuvastatin (CRESTOR) 10 MG tablet TAKE 1 TABLET BY MOUTH  ONCE DAILY FOR CHOLESTEROL.  . SUMAtriptan (IMITREX) 25 MG tablet Take as needed for migraine. Do not take more than twice a week  . tiZANidine (ZANAFLEX) 4 MG tablet Take 1 tablet (4 mg total) by mouth 2 (two) times daily as needed for muscle spasms.  . traZODone (DESYREL) 100 MG tablet TAKE 2 TABLETS AT BEDTIME AS NEEDED FOR SLEEP  . doxycycline (VIBRA-TABS) 100 MG tablet Take 1 tablet (100 mg total) by mouth 2 (two) times daily. (Patient not taking: Reported on 02/26/2019)    No facility-administered encounter medications on file as of 02/26/2019.     Allergies (verified) Patient has no known allergies.   History: Past Medical History:  Diagnosis Date  . Asthma   . Breast cancer ([place])   . CAP (community acquired pneumonia) 04/29/2012  . Chronic sinusitis   . Confusion 12/06/2016  . Diabetes mellitus without complication ([place])   . GERD (gastroesophageal reflux disease)   . History of recurrent UTIs   . Hypertension   . Insomnia   . Migraine   . Neuropathic pain   . Pneumonia   . Rheumatoid arthritis ([place])   . Sleep apnea   . Stroke ([place])   . Thyroid disease    Past Surgical History:  Procedure Laterality Date  . ABDOMINAL HYSTERECTOMY    . BACK SURGERY    . BREAST LUMPECTOMY    . CHOLECYSTECTOMY    . KNEE SURGERY    . SHOULDER SURGERY     Family History  Problem Relation Age of Onset  . Diabetes Daughter   . Hypertension Daughter   . Fibromyalgia Daughter   . GER disease Daughter   . Fibromyalgia Daughter   . Crohn's disease Daughter   . Asthma Daughter   . Hypertension Mother    Social History   Socioeconomic History  . Marital status: Widowed    Spouse name: Not on file  . Number of children: Not on file  . Years of education: Not on file  . Highest education level: Not on file  Occupational History  . Not on file  Social Needs  . Financial resource strain: Not hard at all  . Food insecurity    Worry: Never true    Inability: Never true  . Transportation needs    Medical: No    Non-medical: No  Tobacco Use  . Smoking status: Never Smoker  . Smokeless tobacco: Never Used  Substance and Sexual Activity  . Alcohol use: No  . Drug use: No  . Sexual activity: Not on file  Lifestyle  . Physical activity    Days per week: 0 days    Minutes per session: 0 min  . Stress: Not at all  Relationships  . Social connections    Talks on phone: More than three times a week    Gets together: Never    Attends religious  service: More than 4 times per year    Active member of club or organization: No    Attends meetings of clubs or organizations: Never    Relationship status: Widowed  Other Topics Concern  . Not on file  Social History Narrative   Pt lives in 1 story home with her daughter, [name] and [name]'s husband   Has 2 adult daughters   Highest level of education: some college   Worked mainly as Web designer.    Tobacco Counseling Counseling given: Not Answered   Clinical Intake:  Pre-visit preparation completed: Yes  Pain :  No/denies pain     Nutritional Risks: None Diabetes: Yes CBG done?: No Did pt. bring in CBG monitor from home?: No  How often do you need to have someone help you when you read instructions, pamphlets, or other written materials from your doctor or pharmacy?: 1 - Never What is the last grade level you completed in school?: some college  Interpreter Needed?: No  Information entered by :: [place], LPN   Activities of Daily Living In your present state of health, do you have any difficulty performing the following activities: 02/26/2019 09/14/2018  Hearing? N -  Vision? Y -  Comment has glaucoma -  Difficulty concentrating or making decisions? N -  Walking or climbing stairs? N -  Dressing or bathing? N -  Doing errands, shopping? N N  Preparing Food and eating ? N -  Using the Toilet? N -  In the past six months, have you accidently leaked urine? Y -  Comment wears pads daily -  Do you have problems with loss of bowel control? N -  Managing your Medications? N -  Managing your Finances? N -  Housekeeping or managing your Housekeeping? N -  Some recent data might be hidden     Immunizations and Health Maintenance Immunization History  Administered Date(s) Administered  . Influenza,inj,Quad PF,6+ Mos 01/29/2018  . Pneumococcal Polysaccharide-23 06/20/2018  . Tdap 12/09/2018   Health Maintenance Due  Topic Date Due  . FOOT EXAM   04/28/1945  . DEXA SCAN  04/27/2002  . INFLUENZA VACCINE  12/01/2018  . URINE MICROALBUMIN  12/30/2018    Patient Care Team: Pleas Koch, NP as PCP - General (Internal Medicine) Cameron Sprang, MD as Consulting Physician (Neurology)  Indicate any recent Medical Services you may have received from other than [facility] providers in the past year (date may be approximate).     Assessment:   This is a routine wellness examination for [place].  Hearing/Vision screen  Hearing Screening   _0  _1  _2  _3  _4  _5  _6  _7  _8   Right ear:           Left ear:           Vision Screening Comments: Patient gets annual eye exams  Dietary issues and exercise activities discussed: Current Exercise Habits: The patient does not participate in regular exercise at present, Exercise limited by: None identified  Goals    . Patient Stated     02/26/2019, I will increase my exercise and help to improve my unsteadiness.       Depression Screen PHQ 2/9 Scores 02/26/2019 06/21/2017  PHQ - 2 Score 0 3  PHQ- 9 Score 0 12    Fall Risk Fall Risk  02/26/2019 10/01/2018 02/23/2018 08/08/2017 02/08/2017  Falls in the past year? 1 1 No No Yes  Comment lost of balance - - - -  Number falls in past yr: 1 1 - - 2 or more  Injury with Fall? 0 1 - - Yes  Risk for fall due to : Medication side effect;Impaired balance/gait - - - -  Follow up Falls evaluation completed;Falls prevention discussed - - - -    Is the patient's home free of loose throw rugs in walkways, pet beds, electrical cords, etc?   yes      Grab bars in the bathroom? no      Handrails on the stairs?   no      Adequate lighting?   yes  Timed Get Up  and Go Performed: N/A  Cognitive Function: MMSE - Mini Mental State Exam 02/26/2019  Orientation to time 5  Orientation to Place 5  Registration 3  Attention/ Calculation 5  Recall 3  Language- repeat 1  Mini Cog  Mini-Cog screen was completed. Maximum score is  22. A value of 0 denotes this part of the MMSE was not completed or the patient failed this part of the Mini-Cog screening.  Montreal Cognitive Assessment  02/08/2017  Visuospatial/ Executive (0/5) 3  Naming (0/3) 1  Attention: Read list of digits (0/2) 2  Attention: Read list of letters (0/1) 0  Attention: Serial 7 subtraction starting at 100 (0/3) 3  Language: Repeat phrase (0/2) 2  Language : Fluency (0/1) 0  Abstraction (0/2) 2  Delayed Recall (0/5) 3  Orientation (0/6) 6  Total 22     Screening Tests Health Maintenance  Topic Date Due  . FOOT EXAM  04/28/1945  . DEXA SCAN  04/27/2002  . INFLUENZA VACCINE  12/01/2018  . URINE MICROALBUMIN  12/30/2018  . HEMOGLOBIN A1C  03/17/2019  . PNA vac Low Risk Adult (2 of 2 - PCV13) 06/21/2019  . OPHTHALMOLOGY EXAM  09/18/2019  . TETANUS/TDAP  12/08/2028    Qualifies for Shingles Vaccine? Yes   Cancer Screenings: Lung: Low Dose CT Chest recommended if Age 74-80 years, 30 pack-year currently smoking OR have quit w/in 15years. Patient does not qualify. Breast: Up to date on Mammogram? Yes, no longer required   Up to date of Bone Density/Dexa? No, patient wants this scheduled.  Colorectal: no longer required  Additional Screenings:  Hepatitis C Screening: N/A     Plan:    Patient will increase exercise and work to improve her unsteadiness.   I have personally reviewed and noted the following in the patient's chart:   . Medical and social history . Use of alcohol, tobacco or illicit drugs  . Current medications and supplements . Functional ability and status . Nutritional status . Physical activity . Advanced directives . List of other physicians . Hospitalizations, surgeries, and ER visits in previous 12 months . Vitals . Screenings to include cognitive, depression, and falls . Referrals and appointments  In addition, I have reviewed and discussed with patient certain preventive protocols, quality metrics, and best  practice recommendations. A written personalized care plan for preventive services as well as general preventive health recommendations were provided to patient.     Andrez Grime, LPN   [id number]

## 2019-03-04 ENCOUNTER — Encounter: Payer: Self-pay | Admitting: Primary Care

## 2019-03-04 ENCOUNTER — Ambulatory Visit (INDEPENDENT_AMBULATORY_CARE_PROVIDER_SITE_OTHER): Payer: Medicare HMO | Admitting: Primary Care

## 2019-03-04 ENCOUNTER — Other Ambulatory Visit: Payer: Self-pay

## 2019-03-04 VITALS — BP 126/62 | HR 72 | Temp 97.1°F | Ht 64.0 in | Wt 149.2 lb

## 2019-03-04 DIAGNOSIS — F330 Major depressive disorder, recurrent, mild: Secondary | ICD-10-CM

## 2019-03-04 DIAGNOSIS — K219 Gastro-esophageal reflux disease without esophagitis: Secondary | ICD-10-CM

## 2019-03-04 DIAGNOSIS — E2839 Other primary ovarian failure: Secondary | ICD-10-CM

## 2019-03-04 DIAGNOSIS — Z23 Encounter for immunization: Secondary | ICD-10-CM

## 2019-03-04 DIAGNOSIS — Z1231 Encounter for screening mammogram for malignant neoplasm of breast: Secondary | ICD-10-CM | POA: Diagnosis not present

## 2019-03-04 DIAGNOSIS — I10 Essential (primary) hypertension: Secondary | ICD-10-CM | POA: Diagnosis not present

## 2019-03-04 DIAGNOSIS — C50419 Malignant neoplasm of upper-outer quadrant of unspecified female breast: Secondary | ICD-10-CM

## 2019-03-04 DIAGNOSIS — Z0000 Encounter for general adult medical examination without abnormal findings: Secondary | ICD-10-CM

## 2019-03-04 DIAGNOSIS — M545 Low back pain, unspecified: Secondary | ICD-10-CM

## 2019-03-04 DIAGNOSIS — N390 Urinary tract infection, site not specified: Secondary | ICD-10-CM

## 2019-03-04 DIAGNOSIS — R8290 Unspecified abnormal findings in urine: Secondary | ICD-10-CM | POA: Diagnosis not present

## 2019-03-04 DIAGNOSIS — H409 Unspecified glaucoma: Secondary | ICD-10-CM

## 2019-03-04 DIAGNOSIS — E119 Type 2 diabetes mellitus without complications: Secondary | ICD-10-CM

## 2019-03-04 DIAGNOSIS — E785 Hyperlipidemia, unspecified: Secondary | ICD-10-CM

## 2019-03-04 DIAGNOSIS — E039 Hypothyroidism, unspecified: Secondary | ICD-10-CM

## 2019-03-04 DIAGNOSIS — G4700 Insomnia, unspecified: Secondary | ICD-10-CM

## 2019-03-04 DIAGNOSIS — F0390 Unspecified dementia without behavioral disturbance: Secondary | ICD-10-CM

## 2019-03-04 DIAGNOSIS — M792 Neuralgia and neuritis, unspecified: Secondary | ICD-10-CM

## 2019-03-04 DIAGNOSIS — G8929 Other chronic pain: Secondary | ICD-10-CM

## 2019-03-04 DIAGNOSIS — D649 Anemia, unspecified: Secondary | ICD-10-CM

## 2019-03-04 DIAGNOSIS — E1142 Type 2 diabetes mellitus with diabetic polyneuropathy: Secondary | ICD-10-CM

## 2019-03-04 LAB — POC URINALSYSI DIPSTICK (AUTOMATED)
Bilirubin, UA: NEGATIVE
Glucose, UA: NEGATIVE
Ketones, UA: NEGATIVE
Leukocytes, UA: NEGATIVE
Nitrite, UA: POSITIVE
Protein, UA: POSITIVE — AB
Spec Grav, UA: 1.015 (ref 1.010–1.025)
Urobilinogen, UA: 0.2 E.U./dL
pH, UA: 6 (ref 5.0–8.0)

## 2019-03-04 MED ORDER — ZOSTER VAC RECOMB ADJUVANTED 50 MCG/0.5ML IM SUSR: 0.5000 mL | Freq: Once | INTRAMUSCULAR | 1 refills | Status: AC

## 2019-03-04 MED ORDER — BLOOD GLUCOSE METER KIT: PACK | 0 refills | Status: AC

## 2019-03-04 MED ORDER — GABAPENTIN 800 MG PO TABS: ORAL_TABLET | ORAL | 3 refills | Status: DC

## 2019-03-04 NOTE — Assessment & Plan Note (Signed)
Rx for Shingrix provided today. Influenza vaccination provided today. Other vaccinations UTD. Mammogram due, pending. Bone density scan due, pending. No further need for colon cancer screening given age. Encouraged a healthy diet and regular exercise. Exam today unremarkable. Labs pending.

## 2019-03-04 NOTE — Assessment & Plan Note (Signed)
Doing well on Trazodone, continue same. 

## 2019-03-04 NOTE — Progress Notes (Signed)
Subjective:    Patient ID: Shannon Higgins, female    DOB: 1934/10/14, 84 y.o.   MRN: [id number]  HPI  Shannon Higgins is a 84 year old female who presents today for complete physical.  Immunizations: -Tetanus: Completed in 2020 -Influenza: Due today -Shingles: Never completed  -Pneumonia: Completed in 2020  Diet: She endorses a fair diet. She eats fruit, oatmeal, left overs for lunch, meat, vegetables, starch. Exercise: She is not exercising.  Eye exam: Completed in 2020 Dental exam: Completes semi-annually   Mammogram: Completed in May 2019 Dexa: No recent exam Colonoscopy: Completed in 2012, no further imaging needed  BP Readings from Last 3 Encounters:  03/04/19 126/62  10/25/18 (!) 130/58  10/01/18 140/70      Review of Systems  Constitutional: Negative for unexpected weight change.  HENT: Negative for rhinorrhea.   Respiratory: Negative for cough and shortness of breath.   Cardiovascular: Negative for chest pain.  Gastrointestinal: Negative for constipation and diarrhea.  Genitourinary: Negative for difficulty urinating, dysuria, hematuria and vaginal discharge.       Foul smelling odor x 1 week  Musculoskeletal: Positive for arthralgias.  Skin: Negative for rash.  Allergic/Immunologic: Negative for environmental allergies.  Neurological: Negative for dizziness and numbness.  Psychiatric/Behavioral: The patient is not nervous/anxious.        Past Medical History:  Diagnosis Date  . Asthma   . Breast cancer ([place])   . CAP (community acquired pneumonia) 04/29/2012  . Chronic sinusitis   . Confusion 12/06/2016  . Diabetes mellitus without complication ([place])   . GERD (gastroesophageal reflux disease)   . History of recurrent UTIs   . Hypertension   . Insomnia   . Migraine   . Neuropathic pain   . Pneumonia   . Rheumatoid arthritis ([place])   . Sleep apnea   . Stroke ([place])   . Thyroid disease      Social History   Socioeconomic History  . Marital status:  Widowed    Spouse name: Not on file  . Number of children: Not on file  . Years of education: Not on file  . Highest education level: Not on file  Occupational History  . Not on file  Social Needs  . Financial resource strain: Not hard at all  . Food insecurity    Worry: Never true    Inability: Never true  . Transportation needs    Medical: No    Non-medical: No  Tobacco Use  . Smoking status: Never Smoker  . Smokeless tobacco: Never Used  Substance and Sexual Activity  . Alcohol use: No  . Drug use: No  . Sexual activity: Not on file  Lifestyle  . Physical activity    Days per week: 0 days    Minutes per session: 0 min  . Stress: Not at all  Relationships  . Social connections    Talks on phone: More than three times a week    Gets together: Never    Attends religious service: More than 4 times per year    Active member of club or organization: No    Attends meetings of clubs or organizations: Never    Relationship status: Widowed  . Intimate partner violence    Fear of current or ex partner: No    Emotionally abused: No    Physically abused: No    Forced sexual activity: No  Other Topics Concern  . Not on file  Social History Narrative   Pt  lives in 1 story home with her daughter, Shannon Higgins and Shannon Higgins's husband   Has 2 adult daughters   Highest level of education: some college   Worked mainly as Web designer.    Past Surgical History:  Procedure Laterality Date  . ABDOMINAL HYSTERECTOMY    . BACK SURGERY    . BREAST LUMPECTOMY    . CHOLECYSTECTOMY    . KNEE SURGERY    . SHOULDER SURGERY      Family History  Problem Relation Age of Onset  . Diabetes Daughter   . Hypertension Daughter   . Fibromyalgia Daughter   . GER disease Daughter   . Fibromyalgia Daughter   . Crohn's disease Daughter   . Asthma Daughter   . Hypertension Mother     No Known Allergies  Current Outpatient Medications on File Prior to Visit  Medication Sig Dispense Refill   . albuterol (VENTOLIN HFA) 108 (90 Base) MCG/ACT inhaler Inhale 2 puffs into the lungs every 4 (four) hours as needed for shortness of breath. 1 Inhaler 0  . anastrozole (ARIMIDEX) 1 MG tablet Take 1 mg by mouth daily.    [name] aspirin 325 MG tablet Take 1 tablet (325 mg total) by mouth daily. 30 tablet 1  . brimonidine (ALPHAGAN) 0.2 % ophthalmic solution Place 1 drop into both eyes 3 (three) times daily. Both eyes     . cetirizine (ZYRTEC) 10 MG chewable tablet Chew 1 tablet by mouth once daily as needed for allergies. 90 tablet 0  . donepezil (ARICEPT) 10 MG tablet Take 1 tablet daily 90 tablet 3  . dorzolamide-timolol (COSOPT) 22.3-6.8 MG/ML ophthalmic solution Place 1 drop into both eyes 2 (two) times daily.    . DULoxetine (CYMBALTA) 60 MG capsule TAKE 1 CAPSULE EVERY DAY  FOR  DEPRESSION 90 capsule 0  . fluticasone (FLONASE) 50 MCG/ACT nasal spray USE 2 SPRAYS IN EACH NOSTRIL EVERY DAY 48 g 3  . latanoprost (XALATAN) 0.005 % ophthalmic solution Place 1 drop into both eyes at bedtime.    [name] levothyroxine (SYNTHROID, LEVOTHROID) 125 MCG tablet Take 1 tablet (125 mcg total) by mouth daily before breakfast. Take 1 tablet by mouth every morning on an empty stomach with a full glass of water. No food or other medications for 30 minutes. 90 tablet 2  . meclizine (ANTIVERT) 25 MG tablet Take 1 tablet (25 mg total) by mouth 2 (two) times daily as needed for dizziness. 180 tablet 0  . meloxicam (MOBIC) 15 MG tablet TAKE 1 TABLET EVERY DAY AS NEEDED FOR PAIN 90 tablet 0  . metFORMIN (GLUCOPHAGE) 500 MG tablet TAKE 1 TABLET BY MOUTH TWICE DAILY WITH FOOD FOR DIABETES. 180 tablet 2  . Netarsudil Dimesylate (RHOPRESSA) 0.02 % SOLN INSTILL 1 DROP IN BOTH EYES EVERY DAY    . omeprazole (PRILOSEC) 20 MG capsule TAKE 1 CAPSULE BY MOUTH TWICE DAILY FOR HEARTBURN. 180 capsule 1  . propranolol (INDERAL) 80 MG tablet TAKE 1 TABLET BY MOUTH ONCE DAILY FOR BLOOD PRESSURE. 90 tablet 0  . rosuvastatin (CRESTOR) 10 MG  tablet TAKE 1 TABLET BY MOUTH ONCE DAILY FOR CHOLESTEROL. 90 tablet 2  . SUMAtriptan (IMITREX) 25 MG tablet Take as needed for migraine. Do not take more than twice a week 24 tablet 3  . tiZANidine (ZANAFLEX) 4 MG tablet Take 1 tablet (4 mg total) by mouth 2 (two) times daily as needed for muscle spasms. 14 tablet 0  . traZODone (DESYREL) 100 MG tablet TAKE 2 TABLETS AT  BEDTIME AS NEEDED FOR SLEEP 180 tablet 1  . pilocarpine (PILOCAR) 2 % ophthalmic solution      No current facility-administered medications on file prior to visit.     BP 126/62   Pulse 72   Temp (!) 97.1 F (36.2 C) (Temporal)   Ht 5\' 4"  (1.626 m)   Wt 149 lb 4 oz (67.7 kg)   SpO2 97%   BMI 25.62 kg/m    Objective:   Physical Exam  Constitutional: She is oriented to person, place, and time. She appears well-nourished.  HENT:  Right Ear: Tympanic membrane and ear canal normal.  Left Ear: Tympanic membrane and ear canal normal.  Mouth/Throat: Oropharynx is clear and moist.  Eyes: Pupils are equal, round, and reactive to light. EOM are normal.  Neck: Neck supple.  Cardiovascular: Normal rate and regular rhythm.  Respiratory: Effort normal and breath sounds normal.  GI: Soft. Bowel sounds are normal. There is no abdominal tenderness.  Musculoskeletal:     Comments: Chronic decrease in ROM to hips and lower back, ambulates with walker well.  Neurological: She is alert and oriented to person, place, and time.  Skin: Skin is warm and dry.  Psychiatric: She has a normal mood and affect.           Assessment & Plan:

## 2019-03-04 NOTE — Assessment & Plan Note (Signed)
Taking levothyroxine appropriately except omeprazole within a few hours. Discussed to ensure she separates her omeprazole 4 hours from levothyroxine.  Repeat TSH pending.

## 2019-03-04 NOTE — Assessment & Plan Note (Signed)
Stable in the office today, continue current regimen. 

## 2019-03-04 NOTE — Assessment & Plan Note (Signed)
Doing well on duloxetine, continue same. Denies SI/HI.

## 2019-03-04 NOTE — Assessment & Plan Note (Signed)
Doing well on gabapentin 800 mg TID, Continue same.

## 2019-03-04 NOTE — Assessment & Plan Note (Signed)
Compliant to Crestor. Repeat lipids pending.

## 2019-03-04 NOTE — Assessment & Plan Note (Signed)
Overall doing well with duloxetine, continue same.

## 2019-03-04 NOTE — Assessment & Plan Note (Signed)
Doing well on donepezil 10 mg, continue same.  Following with neurology.

## 2019-03-04 NOTE — Assessment & Plan Note (Signed)
No new symptoms, compliant to statin and aspirin therapy. LDL pending.

## 2019-03-04 NOTE — Assessment & Plan Note (Signed)
CBC pending. Asymptomatic.

## 2019-03-04 NOTE — Assessment & Plan Note (Signed)
Doing well on gabapentin 800 units TID, continue same.

## 2019-03-04 NOTE — Patient Instructions (Addendum)
[facility] to schedule the mammogram and bone density test.  Start exercising. You should be getting 150 minutes of exercise weekly.  Be sure to eat a healthy diet.  Take the shingles vaccination to your pharmacy as discussed. Repeat with the second vaccine 2-6 months after the first.  We will be in touch regarding your urine specimen results.  Stop by the lab prior to leaving today. I will notify you of your results once received.   It was a pleasure to see you today!   Preventive Care 82 Years and Older, Female Preventive care refers to lifestyle choices and visits with your health care provider that can promote health and wellness. This includes:  A yearly physical exam. This is also called an annual well check.  Regular dental and eye exams.  Immunizations.  Screening for certain conditions.  Healthy lifestyle choices, such as diet and exercise. What can I expect for my preventive care visit? Physical exam Your health care provider will check:  Height and weight. These may be used to calculate body mass index (BMI), which is a measurement that tells if you are at a healthy weight.  Heart rate and blood pressure.  Your skin for abnormal spots. Counseling Your health care provider may ask you questions about:  Alcohol, tobacco, and drug use.  Emotional well-being.  Home and relationship well-being.  Sexual activity.  Eating habits.  History of falls.  Memory and ability to understand (cognition).  Work and work Statistician.  Pregnancy and menstrual history. What immunizations do I need?  Influenza (flu) vaccine  This is recommended every year. Tetanus, diphtheria, and pertussis (Tdap) vaccine  You may need a Td booster every 10 years. Varicella (chickenpox) vaccine  You may need this vaccine if you have not already been vaccinated. Zoster (shingles) vaccine  You may need this after age 82. Pneumococcal conjugate (PCV13)  vaccine  One dose is recommended after age 82. Pneumococcal polysaccharide (PPSV23) vaccine  One dose is recommended after age 82. Measles, mumps, and rubella (MMR) vaccine  You may need at least one dose of MMR if you were born in 1957 or later. You may also need a second dose. Meningococcal conjugate (MenACWY) vaccine  You may need this if you have certain conditions. Hepatitis A vaccine  You may need this if you have certain conditions or if you travel or work in places where you may be exposed to hepatitis A. Hepatitis B vaccine  You may need this if you have certain conditions or if you travel or work in places where you may be exposed to hepatitis B. Haemophilus influenzae type b (Hib) vaccine  You may need this if you have certain conditions. You may receive vaccines as individual doses or as more than one vaccine together in one shot (combination vaccines). Talk with your health care provider about the risks and benefits of combination vaccines. What tests do I need? Blood tests  Lipid and cholesterol levels. These may be checked every 5 years, or more frequently depending on your overall health.  Hepatitis C test.  Hepatitis B test. Screening  Lung cancer screening. You may have this screening every year starting at age 82 if you have a 30-pack-year history of smoking and currently smoke or have quit within the past 15 years.  Colorectal cancer screening. All adults should have this screening starting at age 82 and continuing until age 36. Your health care provider may recommend screening at age 82 if you are  at increased risk.  at increased risk. You will have tests every 1-10 years, depending on your results and the type of screening test.  Diabetes screening. This is done by checking your blood sugar (glucose) after you have not eaten for a while (fasting). You may have this done every 1-3 years.  Mammogram. This may be done every 1-2 years. Talk with your health care provider about how  often you should have regular mammograms.  BRCA-related cancer screening. This may be done if you have a family history of breast, ovarian, tubal, or peritoneal cancers. Other tests  Sexually transmitted disease (STD) testing.  Bone density scan. This is done to screen for osteoporosis. You may have this done starting at age 82. Follow these instructions at home: Eating and drinking  Eat a diet that includes fresh fruits and vegetables, whole grains, lean protein, and low-fat dairy products. Limit your intake of foods with high amounts of sugar, saturated fats, and salt.  Take vitamin and mineral supplements as recommended by your health care provider.  Do not drink alcohol if your health care provider tells you not to drink.  If you drink alcohol: ? Limit how much you have to 0-1 drink a day. ? Be aware of how much alcohol is in your drink. In the [place] one drink equals one 12 oz bottle of beer (355 mL), one 5 oz glass of wine (148 mL), or one 1 oz glass of hard liquor (44 mL). Lifestyle  Take daily care of your teeth and gums.  Stay active. Exercise for at least 30 minutes on 5 or more days each week.  Do not use any products that contain nicotine or tobacco, such as cigarettes, e-cigarettes, and chewing tobacco. If you need help quitting, ask your health care provider.  If you are sexually active, practice safe sex. Use a condom or other form of protection in order to prevent STIs (sexually transmitted infections).  Talk with your health care provider about taking a low-dose aspirin or statin. What's next?  Go to your health care provider once a year for a well check visit.  Ask your health care provider how often you should have your eyes and teeth checked.  Stay up to date on all vaccines. This information is not intended to replace advice given to you by your health care provider. Make sure you discuss any questions you have with your health care provider. Document  Released: 05/15/2015 Document Revised: 04/12/2018 Document Reviewed: 04/12/2018 Elsevier Patient Education  2020 [employer].

## 2019-03-04 NOTE — Assessment & Plan Note (Signed)
No recent UTI, however, foul smelling urine x 1 week. UA today with trace leuks, positive nitrites, trace blood. Culture sent, await results.

## 2019-03-04 NOTE — Assessment & Plan Note (Signed)
Has been on anastrozole for 5 years, will be following with oncology. Repeat mammogram pending.

## 2019-03-04 NOTE — Assessment & Plan Note (Signed)
Following with ophthalmology, compliant to current regimen.

## 2019-03-04 NOTE — Assessment & Plan Note (Signed)
Stable on omeprazole, continue same. 

## 2019-03-04 NOTE — Assessment & Plan Note (Signed)
Repeat A1C pending, compliant to Metformin. Managed on statin. Urine microalbumin due next visit. Eye exam UTD. Foot exam due next visit.  Continue metformin for now. Follow up in 6 months.

## 2019-03-05 LAB — CBC
HCT: 39 % (ref 36.0–46.0)
Hemoglobin: 12.5 g/dL (ref 12.0–15.0)
MCHC: 31.9 g/dL (ref 30.0–36.0)
MCV: 85.2 fl (ref 78.0–100.0)
Platelets: 305 10*3/uL (ref 150.0–400.0)
RBC: 4.58 Mil/uL (ref 3.87–5.11)
RDW: 15.6 % — ABNORMAL HIGH (ref 11.5–15.5)
WBC: 10.6 10*3/uL — ABNORMAL HIGH (ref 4.0–10.5)

## 2019-03-05 LAB — LIPID PANEL
Cholesterol: 158 mg/dL (ref 0–200)
HDL: 57.7 mg/dL (ref >39.00)
LDL Cholesterol: 65 mg/dL (ref 0–99)
NonHDL: 100.29
Total CHOL/HDL Ratio: 3
Triglycerides: 178 mg/dL — ABNORMAL HIGH (ref 0.0–149.0)
VLDL: 35.6 mg/dL (ref 0.0–40.0)

## 2019-03-05 LAB — BASIC METABOLIC PANEL
BUN: 9 mg/dL (ref 6–23)
CO2: 32 mEq/L (ref 19–32)
Calcium: 10 mg/dL (ref 8.4–10.5)
Chloride: 98 mEq/L (ref 96–112)
Creatinine, Ser: 0.7 mg/dL (ref 0.40–1.20)
GFR: 80.14 mL/min (ref >60.00)
Glucose, Bld: 102 mg/dL — ABNORMAL HIGH (ref 70–99)
Potassium: 4.3 mEq/L (ref 3.5–5.1)
Sodium: 138 mEq/L (ref 135–145)

## 2019-03-05 LAB — HEMOGLOBIN A1C: Hgb A1c MFr Bld: 6.6 % — ABNORMAL HIGH (ref 4.6–6.5)

## 2019-03-05 LAB — TSH: TSH: 0.24 u[IU]/mL — ABNORMAL LOW (ref 0.35–4.50)

## 2019-03-06 ENCOUNTER — Other Ambulatory Visit: Payer: Self-pay | Admitting: Primary Care

## 2019-03-06 DIAGNOSIS — N3001 Acute cystitis with hematuria: Secondary | ICD-10-CM

## 2019-03-06 LAB — URINE CULTURE
MICRO NUMBER:: 1054523
SPECIMEN QUALITY:: ADEQUATE

## 2019-03-06 MED ORDER — SULFAMETHOXAZOLE-TRIMETHOPRIM 800-160 MG PO TABS: 1.0000 | ORAL_TABLET | Freq: Two times a day (BID) | ORAL | 0 refills | Status: DC

## 2019-03-13 ENCOUNTER — Other Ambulatory Visit: Payer: Self-pay | Admitting: Primary Care

## 2019-03-13 DIAGNOSIS — Z853 Personal history of malignant neoplasm of breast: Secondary | ICD-10-CM

## 2019-03-18 ENCOUNTER — Other Ambulatory Visit: Payer: Self-pay | Admitting: Primary Care

## 2019-03-18 DIAGNOSIS — M5440 Lumbago with sciatica, unspecified side: Secondary | ICD-10-CM

## 2019-03-18 DIAGNOSIS — G8929 Other chronic pain: Secondary | ICD-10-CM

## 2019-03-18 DIAGNOSIS — M792 Neuralgia and neuritis, unspecified: Secondary | ICD-10-CM

## 2019-03-20 ENCOUNTER — Encounter: Payer: Self-pay | Admitting: Primary Care

## 2019-04-02 ENCOUNTER — Other Ambulatory Visit: Payer: Self-pay | Admitting: Primary Care

## 2019-04-04 DIAGNOSIS — H401133 Primary open-angle glaucoma, bilateral, severe stage: Secondary | ICD-10-CM | POA: Diagnosis not present

## 2019-04-12 ENCOUNTER — Telehealth (INDEPENDENT_AMBULATORY_CARE_PROVIDER_SITE_OTHER): Payer: Medicare HMO | Admitting: Primary Care

## 2019-04-12 DIAGNOSIS — E039 Hypothyroidism, unspecified: Secondary | ICD-10-CM | POA: Diagnosis not present

## 2019-04-12 DIAGNOSIS — J019 Acute sinusitis, unspecified: Secondary | ICD-10-CM | POA: Diagnosis not present

## 2019-04-12 MED ORDER — LEVOTHYROXINE SODIUM 112 MCG PO TABS: ORAL_TABLET | ORAL | 0 refills | Status: DC

## 2019-04-12 MED ORDER — AZITHROMYCIN 250 MG PO TABS: ORAL_TABLET | ORAL | 0 refills | Status: DC

## 2019-04-12 NOTE — Progress Notes (Signed)
Subjective:    Patient ID: Shannon Higgins, female    DOB: 1935/03/12, 84 y.o.   MRN: [id number]  HPI  Virtual Visit via Video Note  I connected with Shannon Higgins on 04/12/19 at 11:20 AM EST by a video enabled telemedicine application and verified that I am speaking with the correct person using two identifiers.  Location: Patient: Home Provider: Office   I discussed the limitations of evaluation and management by telemedicine and the availability of in person appointments. The patient expressed understanding and agreed to proceed.  History of Present Illness:  Shannon Higgins is a 84 year old female with a medical history of hypertension, hypotension, hypothyroidism, type 2 diabetes, recurrent UTI, glaucoma, sinusitis who presents today for levothyroxine refill and a chief complaint of sinus pressure.  Currently prescribed levothyroxine 112 mcg. Her last TSH was 0.24 in November 2020 when managed on 125 mcg so we decided to reduce her dose to 112 mcg. Her daughter sent a message today stating that her mother ran out of the 112 mcg dose several days ago so she resumed her 125 mcg dose.  She also reports sinus pressure, nasal congestion, thick green mucous coming from her nasal cavity. Symptoms began three weeks ago. Now she's feeling tired and fatigue. She's been taking Mucinex with some improvement.   Observations/Objective:  Alert and oriented. Appears well, not sickly. No distress. Speaking in complete sentences. Appears tired.  Assessment and Plan:  See problem based charting.  Follow Up Instructions:  Start Azithromycin antibiotics for infection. Take 2 tablets by mouth today, then 1 tablet daily for 4 additional days.  Be sure to take your levothyroxine (thyroid medication) every morning on an empty stomach with water only. No food or other medications for 30 minutes. No heartburn medication, iron pills, calcium, vitamin D, or magnesium pills within four hours of taking  levothyroxine.   It was a pleasure to see you today! Shannon Bossier, NP-C    I discussed the assessment and treatment plan with the patient. The patient was provided an opportunity to ask questions and all were answered. The patient agreed with the plan and demonstrated an understanding of the instructions.   The patient was advised to call back or seek an in-person evaluation if the symptoms worsen or if the condition fails to improve as anticipated.     Pleas Koch, NP    Review of Systems  Constitutional: Positive for fatigue. Negative for fever.  HENT: Positive for congestion, sinus pressure and sinus pain.   Respiratory: Negative for cough.   Allergic/Immunologic: Positive for environmental allergies.       Past Medical History:  Diagnosis Date  . Acute tubular injury of transplanted kidney ([place]) 06/14/2016  . AKI (acute kidney injury) ([place]) 06/14/2016  . Asthma   . Breast cancer ([place])   . CAP (community acquired pneumonia) 04/29/2012  . Chronic sinusitis   . Confusion 12/06/2016  . Diabetes mellitus without complication ([place])   . GERD (gastroesophageal reflux disease)   . History of ankle fracture 05/14/2018   Last Assessment & Plan:  With a recent fall. Right medial malleous. Has ortho follow up soon, splinted now and tylenol helps her pain  . History of recurrent UTIs   . Hypertension   . Hypokalemia 04/26/2012  . Hyponatremia   . Insomnia   . Migraine   . Neuropathic pain   . Pneumonia   . Rheumatoid arthritis ([place])   . Sepsis ([place]) 04/26/2012  .  Sleep apnea   . Stroke ([place])   . Thyroid disease      Social History   Socioeconomic History  . Marital status: Widowed    Spouse name: Not on file  . Number of children: Not on file  . Years of education: Not on file  . Highest education level: Not on file  Occupational History  . Not on file  Tobacco Use  . Smoking status: Never Smoker  . Smokeless tobacco: Never Used  Substance and Sexual Activity    . Alcohol use: No  . Drug use: No  . Sexual activity: Not on file  Other Topics Concern  . Not on file  Social History Narrative   Pt lives in 1 story home with her daughter, [name] and [name]'s husband   Has 2 adult daughters   Highest level of education: some college   Worked mainly as Web designer.   Social Determinants of Health   Financial Resource Strain: Low Risk   . Difficulty of Paying Living Expenses: Not hard at all  Food Insecurity: No Food Insecurity  . Worried About Charity fundraiser in the Last Year: Never true  . Ran Out of Food in the Last Year: Never true  Transportation Needs: No Transportation Needs  . Lack of Transportation (Medical): No  . Lack of Transportation (Non-Medical): No  Physical Activity: Inactive  . Days of Exercise per Week: 0 days  . Minutes of Exercise per Session: 0 min  Stress: No Stress Concern Present  . Feeling of Stress : Not at all  Social Connections: Somewhat Isolated  . Frequency of Communication with Friends and Family: More than three times a week  . Frequency of Social Gatherings with Friends and Family: Never  . Attends Religious Services: More than 4 times per year  . Active Member of Clubs or Organizations: No  . Attends Archivist Meetings: Never  . Marital Status: Widowed  Intimate Partner Violence: Not At Risk  . Fear of Current or Ex-Partner: No  . Emotionally Abused: No  . Physically Abused: No  . Sexually Abused: No    Past Surgical History:  Procedure Laterality Date  . ABDOMINAL HYSTERECTOMY    . BACK SURGERY    . BREAST LUMPECTOMY    . CHOLECYSTECTOMY    . KNEE SURGERY    . SHOULDER SURGERY      Family History  Problem Relation Age of Onset  . Diabetes Daughter   . Hypertension Daughter   . Fibromyalgia Daughter   . GER disease Daughter   . Fibromyalgia Daughter   . Crohn's disease Daughter   . Asthma Daughter   . Hypertension Mother     No Known Allergies  Current  Outpatient Medications on File Prior to Visit  Medication Sig Dispense Refill  . albuterol (VENTOLIN HFA) 108 (90 Base) MCG/ACT inhaler Inhale 2 puffs into the lungs every 4 (four) hours as needed for shortness of breath. 1 Inhaler 0  . anastrozole (ARIMIDEX) 1 MG tablet Take 1 mg by mouth daily.    [name] aspirin 325 MG tablet Take 1 tablet (325 mg total) by mouth daily. 30 tablet 1  . blood glucose meter kit and supplies Dispense based on patient and insurance preference. Use up to 2 times daily as directed. (FOR ICD-10 E10.9, E11.9). 1 each 0  . brimonidine (ALPHAGAN) 0.2 % ophthalmic solution Place 1 drop into both eyes 3 (three) times daily. Both eyes     .  cetirizine (ZYRTEC) 10 MG chewable tablet Chew 1 tablet by mouth once daily as needed for allergies. 90 tablet 0  . donepezil (ARICEPT) 10 MG tablet Take 1 tablet daily 90 tablet 3  . dorzolamide-timolol (COSOPT) 22.3-6.8 MG/ML ophthalmic solution Place 1 drop into both eyes 2 (two) times daily.    . DULoxetine (CYMBALTA) 60 MG capsule TAKE 1 CAPSULE EVERY DAY  FOR  DEPRESSION 90 capsule 1  . fluticasone (FLONASE) 50 MCG/ACT nasal spray USE 2 SPRAYS IN EACH NOSTRIL EVERY DAY 48 g 3  . gabapentin (NEURONTIN) 800 MG tablet TAKE 1 TABLET BY MOUTH THREE TIMES A DAY 270 tablet 3  . latanoprost (XALATAN) 0.005 % ophthalmic solution Place 1 drop into both eyes at bedtime.    . meclizine (ANTIVERT) 25 MG tablet Take 1 tablet (25 mg total) by mouth 2 (two) times daily as needed for dizziness. 180 tablet 0  . meloxicam (MOBIC) 15 MG tablet TAKE 1 TABLET EVERY DAY AS NEEDED FOR PAIN 90 tablet 0  . metFORMIN (GLUCOPHAGE) 500 MG tablet TAKE 1 TABLET BY MOUTH TWICE DAILY WITH FOOD FOR DIABETES. 180 tablet 2  . Netarsudil Dimesylate (RHOPRESSA) 0.02 % SOLN INSTILL 1 DROP IN BOTH EYES EVERY DAY    . omeprazole (PRILOSEC) 20 MG capsule TAKE 1 CAPSULE BY MOUTH TWICE DAILY FOR HEARTBURN. 180 capsule 1  . pilocarpine (PILOCAR) 2 % ophthalmic solution     .  propranolol (INDERAL) 80 MG tablet TAKE 1 TABLET BY MOUTH ONCE DAILY FOR BLOOD PRESSURE. 90 tablet 1  . rosuvastatin (CRESTOR) 10 MG tablet TAKE 1 TABLET BY MOUTH ONCE DAILY FOR CHOLESTEROL. 90 tablet 2  . sulfamethoxazole-trimethoprim (BACTRIM DS) 800-160 MG tablet Take 1 tablet by mouth 2 (two) times daily. For urinary tract infection. 10 tablet 0  . SUMAtriptan (IMITREX) 25 MG tablet Take as needed for migraine. Do not take more than twice a week 24 tablet 3  . tiZANidine (ZANAFLEX) 4 MG tablet Take 1 tablet (4 mg total) by mouth 2 (two) times daily as needed for muscle spasms. 14 tablet 0  . traZODone (DESYREL) 100 MG tablet TAKE 2 TABLETS AT BEDTIME AS NEEDED FOR SLEEP 180 tablet 1   No current facility-administered medications on file prior to visit.    There were no vitals taken for this visit.   Objective:   Physical Exam  Constitutional: She is oriented to person, place, and time. She appears well-nourished.  Respiratory: Effort normal.  Neurological: She is alert and oriented to person, place, and time.  Psychiatric: She has a normal mood and affect.           Assessment & Plan:  Acute Sinusitis:  Symptoms for three weeks, not improving and feeling worse. Exam virtually overall stable. She does appear tired. Rx for Zpak sent to pharmacy as this has historically worked well and is her preference. She will update.  Pleas Koch, NP

## 2019-04-12 NOTE — Assessment & Plan Note (Signed)
Refills for levothyroxine 112 mcg sent to pharmacy. Will repeat TSH in 4 weeks.

## 2019-04-12 NOTE — Telephone Encounter (Signed)
Shannon Higgins, will you call them and put her in the 11:20 virtually?

## 2019-04-23 ENCOUNTER — Other Ambulatory Visit: Payer: Self-pay | Admitting: Primary Care

## 2019-04-23 MED ORDER — ACCU-CHEK GUIDE VI STRP: ORAL_STRIP | 1 refills | Status: DC

## 2019-04-23 MED ORDER — ACCU-CHEK FASTCLIX LANCETS MISC: 1 refills | Status: DC

## 2019-05-05 ENCOUNTER — Other Ambulatory Visit: Payer: Self-pay | Admitting: Primary Care

## 2019-05-05 DIAGNOSIS — E039 Hypothyroidism, unspecified: Secondary | ICD-10-CM

## 2019-05-08 ENCOUNTER — Encounter: Payer: Self-pay | Admitting: Neurology

## 2019-05-08 ENCOUNTER — Telehealth (INDEPENDENT_AMBULATORY_CARE_PROVIDER_SITE_OTHER): Payer: Medicare HMO | Admitting: Neurology

## 2019-05-08 ENCOUNTER — Other Ambulatory Visit: Payer: Self-pay

## 2019-05-08 VITALS — Ht 64.0 in | Wt 150.0 lb

## 2019-05-08 DIAGNOSIS — F0390 Unspecified dementia without behavioral disturbance: Secondary | ICD-10-CM

## 2019-05-08 DIAGNOSIS — R531 Weakness: Secondary | ICD-10-CM

## 2019-05-08 DIAGNOSIS — G43709 Chronic migraine without aura, not intractable, without status migrainosus: Secondary | ICD-10-CM

## 2019-05-08 DIAGNOSIS — Z8673 Personal history of transient ischemic attack (TIA), and cerebral infarction without residual deficits: Secondary | ICD-10-CM | POA: Diagnosis not present

## 2019-05-08 DIAGNOSIS — F03A0 Unspecified dementia, mild, without behavioral disturbance, psychotic disturbance, mood disturbance, and anxiety: Secondary | ICD-10-CM

## 2019-05-08 DIAGNOSIS — R29898 Other symptoms and signs involving the musculoskeletal system: Secondary | ICD-10-CM

## 2019-05-08 DIAGNOSIS — R202 Paresthesia of skin: Secondary | ICD-10-CM

## 2019-05-08 NOTE — Progress Notes (Signed)
Virtual Visit via Video Note The purpose of this virtual visit is to provide medical care while limiting exposure to the novel coronavirus.    Consent was obtained for video visit:  Yes.   Answered questions that patient had about telehealth interaction:  Yes.   I discussed the limitations, risks, security and privacy concerns of performing an evaluation and management service by telemedicine. I also discussed with the patient that there may be a patient responsible charge related to this service. The patient expressed understanding and agreed to proceed.  Pt location: Home Physician Location: office Name of referring provider:  Pleas Koch, NP I connected with Shannon Higgins at patients initiation/request on 05/08/2019 at 10:00 AM EST by video enabled telemedicine application and verified that I am speaking with the correct person using two identifiers. Pt MRN:  [id number] Pt DOB:  1934/07/24 Video Participants:  Shannon Higgins;  Shannon Higgins (daughter)   History of Present Illness:  The patient was seen as a virtual video visit on 05/15/2019. She was last seen 7 months ago for chronic migraines and memory loss. Her daughter Shannon Higgins is present during the e-visit to provide additional information. Since her last visit, she denies any further episodes similar to May 2020 when she had bilateral leg weakness, confusion, and right facial droop. MRI brain no acute changes, MRA showed multifocal severe stenosis and/or short segment occlusion of the right PCA. She is on aspirin 328m daily and a statin. Migraines are much better with Botox, she has migraines every other month, sometimes none at all. She does not need Imitrex often. She is also on gabapentin 8063mTID without side effects. She has had a couple of falls since her last visit, last fall was in August. She reports balance is awful. She has numbness and tingling in both feet, hands are unaffected. She has back pain. No dizziness. They  report her memory is pretty good. [name] manages her medications and finances. She is independent with dressing and bathing. She does not drive. She is on Donepezil 103maily without side effects.    History on Initial Assessment 02/08/2017: This is a [age]ar old right-handed woman with a history of hypertension, diabetes, rheumatoid arthritis, breast cancer, presenting for evaluation of chronic headaches and confusion.   1. Headaches Headaches started in her 30s60she went for a period of time with no significant headaches for a year, then last June headaches recurred lasting for weeks at a time. She has 20 to 25 headache days a month, with nausea, upset stomach, sometimes diarrhea. She describes a lot of pressure and throbbing. She had a headache that lasted for 2 months, resolving 2 weeks ago, then this weekend headaches started back and have been ongoing for 4-5 days. She denies any visual obscurations, no photo/phonophobia. She does not usually have dizziness with the headaches, but recently has been so dizzy she had difficulty getting to the bathroom the past week, with described spinning sensation. Meclizine helps some. She has had vertigo in the past. No family history of migraines. She has tried several headache preventative medications, including amitriptyline, Topiramate. She is taking Propranolol 47m27mily with no side effects. She is also on Neurontin for back pain and Cymbalta for mood.   2. Confusion. She reports word-finding difficulties for the past 6 months. Her daughter mostly describes the episodic confusion as forgetting what she had previously told the patient. She has been taking an old meclizine prescription, but forgot that her daughter  told her this information. She has not been driving for the past month due to glaucoma, denied previously getting lost driving. After her husband passed away 1.5 years ago, she moved in with her daughter and son-in-law. They are in charge of finances.  Her daughter states she forgets her medications. One time she forgot to take her Cymbalta for a week. No paranoia or hallucinations. She used to be a social butterfly but now likes to be more at home. There is a strong family history of dementia in her mother and multiple siblings.   She denies any diplopia, dysarthria/dysphagia, anosmia, or tremors. She has chronic neck and back pain and neuropathy in both feet radiating up her legs. She has a little urinary incontinence, no bowel issues. They report several falls with the dizziness. After she hit her head with fall in July, she had a bad headache for 6 weeks. She was veering to the left and went to the ER on 7/13 where head CT did not show any acute changes. She got a migraine cocktail but headache was still bad the next day.      Current Outpatient Medications on File Prior to Visit  Medication Sig Dispense Refill  . Accu-Chek FastClix Lancets MISC Use as instructed to test blood sugar daily 300 each 1  . ACCU-CHEK GUIDE test strip Use as instructed to blood sugar daily 300 each 1  . albuterol (VENTOLIN HFA) 108 (90 Base) MCG/ACT inhaler Inhale 2 puffs into the lungs every 4 (four) hours as needed for shortness of breath. 1 Inhaler 0  . anastrozole (ARIMIDEX) 1 MG tablet Take 1 mg by mouth daily.    [name] aspirin 325 MG tablet Take 1 tablet (325 mg total) by mouth daily. 30 tablet 1  . azithromycin (ZITHROMAX) 250 MG tablet Take 2 tablets by mouth today, then 1 tablet daily for 4 additional days. 6 tablet 0  . blood glucose meter kit and supplies Dispense based on patient and insurance preference. Use up to 2 times daily as directed. (FOR ICD-10 E10.9, E11.9). 1 each 0  . brimonidine (ALPHAGAN) 0.2 % ophthalmic solution Place 1 drop into both eyes 3 (three) times daily. Both eyes     . cetirizine (ZYRTEC) 10 MG chewable tablet Chew 1 tablet by mouth once daily as needed for allergies. 90 tablet 0  . donepezil (ARICEPT) 10 MG tablet Take 1 tablet  daily 90 tablet 3  . dorzolamide-timolol (COSOPT) 22.3-6.8 MG/ML ophthalmic solution Place 1 drop into both eyes 2 (two) times daily.    . DULoxetine (CYMBALTA) 60 MG capsule TAKE 1 CAPSULE EVERY DAY  FOR  DEPRESSION 90 capsule 1  . fluticasone (FLONASE) 50 MCG/ACT nasal spray USE 2 SPRAYS IN EACH NOSTRIL EVERY DAY 48 g 3  . gabapentin (NEURONTIN) 800 MG tablet TAKE 1 TABLET BY MOUTH THREE TIMES A DAY 270 tablet 3  . latanoprost (XALATAN) 0.005 % ophthalmic solution Place 1 drop into both eyes at bedtime.    [name] levothyroxine (SYNTHROID) 112 MCG tablet Take 1 tablet by mouth every morning on an empty stomach with water only.  No food or other medications for 30 minutes. 90 tablet 0  . meclizine (ANTIVERT) 25 MG tablet Take 1 tablet (25 mg total) by mouth 2 (two) times daily as needed for dizziness. 180 tablet 0  . meloxicam (MOBIC) 15 MG tablet TAKE 1 TABLET EVERY DAY AS NEEDED FOR PAIN 90 tablet 0  . metFORMIN (GLUCOPHAGE) 500 MG tablet TAKE 1 TABLET BY  MOUTH TWICE DAILY WITH FOOD FOR DIABETES. 180 tablet 2  . Netarsudil Dimesylate (RHOPRESSA) 0.02 % SOLN INSTILL 1 DROP IN BOTH EYES EVERY DAY    . omeprazole (PRILOSEC) 20 MG capsule TAKE 1 CAPSULE BY MOUTH TWICE DAILY FOR HEARTBURN. 180 capsule 1  . pilocarpine (PILOCAR) 2 % ophthalmic solution     . propranolol (INDERAL) 80 MG tablet TAKE 1 TABLET BY MOUTH ONCE DAILY FOR BLOOD PRESSURE. 90 tablet 1  . rosuvastatin (CRESTOR) 10 MG tablet TAKE 1 TABLET BY MOUTH ONCE DAILY FOR CHOLESTEROL. 90 tablet 2  . sulfamethoxazole-trimethoprim (BACTRIM DS) 800-160 MG tablet Take 1 tablet by mouth 2 (two) times daily. For urinary tract infection. 10 tablet 0  . SUMAtriptan (IMITREX) 25 MG tablet Take as needed for migraine. Do not take more than twice a week 24 tablet 3  . tiZANidine (ZANAFLEX) 4 MG tablet Take 1 tablet (4 mg total) by mouth 2 (two) times daily as needed for muscle spasms. 14 tablet 0  . traZODone (DESYREL) 100 MG tablet TAKE 2 TABLETS AT  BEDTIME AS NEEDED FOR SLEEP 180 tablet 1   No current facility-administered medications on file prior to visit.     Observations/Objective:   Vitals:   05/08/19 1012  Weight: 150 lb (68 kg)  Height: _0  (1.626 m)   GEN:  The patient appears stated age and is in NAD.  Neurological examination: Patient is awake, alert, oriented x 3. No aphasia or dysarthria. Intact fluency and comprehension. Remote and recent memory intact. Able to name and repeat. Cranial nerves: Extraocular movements intact with no nystagmus. No facial asymmetry. Motor: moves all extremities symmetrically, at least anti-gravity x 4. No incoordination on finger to nose testing. Gait: slow and cautious, no ataxia   Assessment and Plan:   This is an 85 yo RH woman with a history of hypertension, diabetes, rheumatoid arthritis, breast cancer, with chronic migraines and mild dementia. She has had good response to Botox and has her next session scheduled this month. She has prn Imitrex, we again discussed risks with prior history of stroke/TIA, she will try Nurtec for migraine rescue. Side effects discussed. Memory stable, continue Donepezil 47m daily. She and her daughter report leg weakness, balance issues, and falls. EMG/NCV of both legs will be ordered to further evaluate symptoms, neuropathy labs will be sent. Check TSH, B12, B1, ESR, CRP, copper, SPEP, IFE, folate, ANA, SS-A, SS-B. Continue home safety and fall precautions. She does not drive. Follow-up in 6 months, they know to call for any changes.    Follow Up Instructions:   -I discussed the assessment and treatment plan with the patient. The patient was provided an opportunity to ask questions and all were answered. The patient agreed with the plan and demonstrated an understanding of the instructions.   The patient was advised to call back or seek an in-person evaluation if the symptoms worsen or if the condition fails to improve as anticipated.     KCameron Sprang MD

## 2019-05-09 ENCOUNTER — Other Ambulatory Visit: Payer: Self-pay

## 2019-05-09 DIAGNOSIS — G4700 Insomnia, unspecified: Secondary | ICD-10-CM

## 2019-05-09 NOTE — Telephone Encounter (Signed)
Patient states that she is about to run out of Trazodone and she did not realize it until today. She states [facility] did not have anymore refills. Patient wants to know if we [facility] [place] [facility]. I did tell patient I was not sure if her insurance would cover it locally the same as it did to mail order.

## 2019-05-10 MED ORDER — TRAZODONE HCL 100 MG PO TABS: ORAL_TABLET | ORAL | 0 refills | Status: DC

## 2019-05-10 NOTE — Telephone Encounter (Signed)
Noted, refill sent to pharmacy. 

## 2019-05-15 MED ORDER — DONEPEZIL HCL 10 MG PO TABS: ORAL_TABLET | ORAL | 3 refills | Status: DC

## 2019-05-15 MED ORDER — NURTEC 75 MG PO TBDP: 1.0000 | ORAL_TABLET | ORAL | 5 refills | Status: DC | PRN

## 2019-05-17 ENCOUNTER — Other Ambulatory Visit: Payer: Self-pay

## 2019-05-17 ENCOUNTER — Ambulatory Visit (INDEPENDENT_AMBULATORY_CARE_PROVIDER_SITE_OTHER): Payer: Medicare HMO | Admitting: Neurology

## 2019-05-17 DIAGNOSIS — G43709 Chronic migraine without aura, not intractable, without status migrainosus: Secondary | ICD-10-CM

## 2019-05-17 MED ORDER — ONABOTULINUMTOXINA 100 UNITS IJ SOLR
200.0000 [IU] | Freq: Once | INTRAMUSCULAR | Status: AC
  Administered 2019-05-17: 200 [IU] via INTRAMUSCULAR

## 2019-05-17 NOTE — Progress Notes (Signed)
[facility]  ° °Procedure Note Botox ° °Attending: Dr. Clarnce Homan ° °Preoperative Diagnosis(es): Chronic migraine ° °Consent obtained from: The patient °Benefits discussed included, but were not limited to decreased muscle tightness, increased joint range of motion, and decreased pain.  Risk discussed included, but were not limited pain and discomfort, bleeding, bruising, excessive weakness, venous thrombosis, muscle atrophy and dysphagia.  Anticipated outcomes of the procedure as well as he risks and benefits of the alternatives to the procedure, and the roles and tasks of the personnel to be involved, were discussed with the patient, and the patient consents to the procedure and agrees to proceed. A copy of the patient medication guide was given to the patient which explains the blackbox warning. ° °Patients identity and treatment sites confirmed Yes.  . ° °Details of Procedure: °Skin was cleaned with alcohol. Prior to injection, the needle plunger was aspirated to make sure the needle was not within a blood vessel.  There was no blood retrieved on aspiration.   ° °Following is a summary of the muscles injected  And the amount of Botulinum toxin used: ° °Dilution °200 units of Botox was reconstituted with 4 ml of preservative free normal saline. °Time of reconstitution: At the time of the office visit (<30 minutes prior to injection)  ° °Injections  °155 total units of Botox was injected with a 30 gauge needle. ° °Injection Sites: °L occipitalis: 15 units- 3 sites  °R occiptalis: 15 units- 3 sites ° °L upper trapezius: 15 units- 3 sites °R upper trapezius: 15 units- 3 sits          °L paraspinal: 10 units- 2 sites °R paraspinal: 10 units- 2 sites ° °Face °L frontalis(2 injection sites):10 units   °R frontalis(2 injection sites):10 units         °L corrugator: 5 units   °R corrugator: 5 units           °Procerus: 5 units   °L temporalis: 20 units °R temporalis: 20 units  ° °Agent:  °200 units of botulinum Type  A (Onobotulinum Toxin type A) was reconstituted with 4 ml of preservative free normal saline.  °Time of reconstitution: At the time of the office visit (<30 minutes prior to injection)  ° ° ° Total injected (Units):  155 ° Total wasted (Units):  45 ° °Patient tolerated procedure well without complications.   °Reinjection is anticipated in 3 months. ° ° °

## 2019-05-21 DIAGNOSIS — E039 Hypothyroidism, unspecified: Secondary | ICD-10-CM

## 2019-05-22 ENCOUNTER — Other Ambulatory Visit: Payer: Self-pay

## 2019-05-22 ENCOUNTER — Ambulatory Visit (INDEPENDENT_AMBULATORY_CARE_PROVIDER_SITE_OTHER): Payer: Medicare HMO | Admitting: Neurology

## 2019-05-22 ENCOUNTER — Other Ambulatory Visit (INDEPENDENT_AMBULATORY_CARE_PROVIDER_SITE_OTHER): Payer: Medicare HMO

## 2019-05-22 DIAGNOSIS — M5417 Radiculopathy, lumbosacral region: Secondary | ICD-10-CM

## 2019-05-22 DIAGNOSIS — E039 Hypothyroidism, unspecified: Secondary | ICD-10-CM | POA: Diagnosis not present

## 2019-05-22 DIAGNOSIS — G43709 Chronic migraine without aura, not intractable, without status migrainosus: Secondary | ICD-10-CM

## 2019-05-22 DIAGNOSIS — F0390 Unspecified dementia without behavioral disturbance: Secondary | ICD-10-CM | POA: Diagnosis not present

## 2019-05-22 DIAGNOSIS — F03A0 Unspecified dementia, mild, without behavioral disturbance, psychotic disturbance, mood disturbance, and anxiety: Secondary | ICD-10-CM

## 2019-05-22 DIAGNOSIS — G629 Polyneuropathy, unspecified: Secondary | ICD-10-CM

## 2019-05-22 NOTE — Procedures (Signed)
[facility]  [place], [place]  [place], [place] [address] Tel: [phone] Fax:  [phone] Test Date:  05/22/2019  Patient: Shannon Higgins DOB: 1935-02-19 Physician: Narda Amber, DO  Sex: Female Height: 5\' 4"  Ref Phys: Ellouise Newer, MD  ID#: [id number] Temp: 32.0C Technician:    Patient Complaints: This is a 85 year old female referred for evaluation of bilateral leg weakness, falls, and paresthesias.  NCV & EMG Findings: Extensive electrodiagnostic testing of the right lower extremity and additional studies of the left shows:  1. Bilateral sural and superficial peroneal sensory responses are absent. 2. Tibial motor response is reduced on the right (1.3 mV) and absent on the left.  Left peroneal motor response at the extensor digitorum brevis shows reduced amplitude (2.0 mV) and is normal at the tibialis anterior.  Right peroneal motor response is within normal limits. 3. Bilateral tibial H reflex studies are absent. 4. Chronic motor axonal loss changes are seen affecting bilateral tibialis anterior, rectus femoris muscles, and the right medial gastrocnemius.  Impression: 1. The electrophysiologic findings are consistent with a chronic sensorimotor axonal polyneuropathy affecting the lower extremities. 2. There is superimposed chronic L4 radiculopathy affecting bilateral lower extremities.   ___________________________ Narda Amber, DO    Nerve Conduction Studies Anti Sensory Summary Table   Site NR Peak (ms) Norm Peak (ms) P-T Amp (V) Norm P-T Amp  Left Sup Peroneal Anti Sensory (Ant Lat Mall)  32C  12 cm NR  <4.6  >3  Right Sup Peroneal Anti Sensory (Ant Lat Mall)  32C  12 cm NR  <4.6  >3  Left Sural Anti Sensory (Lat Mall)  32C  Calf NR  <4.6  >3  Right Sural Anti Sensory (Lat Mall)  32C  Calf NR  <4.6  >3   Motor Summary Table   Site NR Onset (ms) Norm Onset (ms) O-P Amp (mV) Norm O-P Amp Site1 Site2 Delta-0 (ms) Dist (cm) Vel (m/s) Norm  Vel (m/s)  Left Peroneal Motor (Ext Dig Brev)  32C  Ankle    5.2 <6.0 2.0 >2.5 B Fib Ankle 9.1 37.0 41 >40  B Fib    14.3  2.0  Poplt B Fib 1.6 8.0 50 >40  Poplt    15.9  2.0         Right Peroneal Motor (Ext Dig Brev)  32C  Ankle    5.4 <6.0 3.3 >2.5 B Fib Ankle 8.8 37.0 42 >40  B Fib    14.2  2.7  Poplt B Fib 1.8 0.0  >40  Poplt    16.0  2.3         Left Peroneal TA Motor (Tib Ant)  32C  Fib Head    3.5 <4.5 4.0 >3 Poplit Fib Head 1.2 7.0 58 >40  Poplit    4.7  3.6         Left Tibial Motor (Abd Hall Brev)  32C  Ankle NR  <6.0  >4 Knee Ankle  0.0  >40  Knee NR            Right Tibial Motor (Abd Hall Brev)  32C  Ankle    4.2 <6.0 1.3 >4 Knee Ankle 10.3 42.0 41 >40  Knee    14.5  0.9          H Reflex Studies   NR H-Lat (ms) Lat Norm (ms) L-R H-Lat (ms)  Left Tibial (Gastroc)  32C  NR  <35   Right Tibial (Gastroc)  32C  NR  <35    EMG   Side Muscle Ins Act Fibs Psw Fasc Number Recrt Dur Dur. Amp Amp. Poly Poly. Comment  Right GluteusMed Nml Nml Nml Nml Nml Nml Nml Nml Nml Nml Nml Nml N/A  Right Gastroc Nml Nml Nml Nml 1- Rapid Some 1+ Some 1+ Nml Nml N/A  Right RectFemoris Nml Nml Nml Nml 1- Rapid Some 1+ Some 1+ Nml Nml N/A  Right AntTibialis Nml Nml Nml Nml 1- Rapid Some 1+ Some 1+ Nml Nml N/A  Right AdductorLong Nml Nml Nml Nml Nml Nml Nml Nml Nml Nml Nml Nml N/A  Right BicepsFemS Nml Nml Nml Nml Nml Nml Nml Nml Nml Nml Nml Nml N/A  Left Gastroc Nml Nml Nml Nml Nml Nml Nml Nml Nml Nml Nml Nml N/A  Left AntTibialis Nml Nml Nml Nml 1- Rapid Some 1+ Some 1+ Nml Nml N/A  Left GluteusMed Nml Nml Nml Nml Nml Nml Nml Nml Nml Nml Nml Nml N/A  Left RectFemoris Nml Nml Nml Nml 1- Rapid Few 1+ Few 1+ Nml Nml N/A      Waveforms:

## 2019-05-23 DIAGNOSIS — M545 Low back pain: Secondary | ICD-10-CM | POA: Diagnosis not present

## 2019-05-23 DIAGNOSIS — G894 Chronic pain syndrome: Secondary | ICD-10-CM | POA: Diagnosis not present

## 2019-05-23 LAB — TSH: TSH: 2.24 u[IU]/mL (ref 0.35–4.50)

## 2019-05-30 ENCOUNTER — Telehealth: Payer: Self-pay

## 2019-05-30 NOTE — Telephone Encounter (Signed)
Pls let her know that I spoke to our neuromuscular specialist who specializes in neuropathy about the laser treatment. Non-neurologists offer laser services as alternative forms of therapy and are not FDA approved and often very costly ($2000-4000). We would not recommend it, they can explore at their own discretion, but that we do not endorse it. Thanks

## 2019-05-30 NOTE — Telephone Encounter (Signed)
-----   Message from Cameron Sprang, MD sent at 05/30/2019  9:57 AM EST ----- Pls let her know the nerve and muscle test showed both neuropathy in her feet, but also pinched nerves in her lower back. We can do an MRI of the lumbar spine without contrast (Dx: lumbar radiculopathy), and if significant, send to Ortho. Pls ask what she would like to do, thanks

## 2019-05-30 NOTE — Telephone Encounter (Signed)
Pt called and informed that  Dr Delice Lesch spoke to our neuromuscular specialist who specializes in neuropathy about the laser treatment. Non-neurologists offer laser services as alternative forms of therapy and are not FDA approved and often very costly ($2000-4000). We would not recommend it, they can explore at their own discretion, but that we do not endorse it.

## 2019-05-30 NOTE — Telephone Encounter (Signed)
Spoke with pt to let her know the nerve and muscle test showed both neuropathy in her feet, but also pinched nerves in her lower back. She is already scheduled to get an injection in her lower back to help with the pinched nerve. Pt was asking about getting laser  Treatment for the neuropathy?

## 2019-05-31 ENCOUNTER — Ambulatory Visit
Admission: RE | Admit: 2019-05-31 | Discharge: 2019-05-31 | Disposition: A | Discharge status: Home / Self Care | Payer: Medicare HMO | Source: Ambulatory Visit | Attending: Primary Care | Admitting: Primary Care

## 2019-05-31 ENCOUNTER — Other Ambulatory Visit: Payer: Self-pay

## 2019-05-31 DIAGNOSIS — R928 Other abnormal and inconclusive findings on diagnostic imaging of breast: Secondary | ICD-10-CM | POA: Diagnosis not present

## 2019-05-31 DIAGNOSIS — Z853 Personal history of malignant neoplasm of breast: Secondary | ICD-10-CM

## 2019-06-06 DIAGNOSIS — M5136 Other intervertebral disc degeneration, lumbar region: Secondary | ICD-10-CM | POA: Diagnosis not present

## 2019-07-01 ENCOUNTER — Telehealth: Payer: Self-pay

## 2019-07-01 NOTE — Telephone Encounter (Signed)
Tried to call patient back and VM is full

## 2019-07-01 NOTE — Telephone Encounter (Signed)
Pt requesting HH PT by [facility] for neuropathy and pain in both feet and pt having difficulty in walking; pt said at times her feet "feel out of control". Pt either uses walker or has assistance when walking. Pt has had neuropathy for years but now pt having trouble walking by her self. Pt said last time had PT for this was July 2020 and pt got better but then pt stopped doing exercises and now have issues again with pain and walking. Pt is going to be staying at her sisters for approx 2 months; address is [address] [place] [address].last virtual appt on 04/12/19. Pt request cb when referral for [facility] PT is done.

## 2019-07-01 NOTE — Telephone Encounter (Signed)
[place] for [facility] PT orders as prescribed.

## 2019-07-02 NOTE — Telephone Encounter (Signed)
Left message for patient's daughter Shannon Higgins) to return my call

## 2019-07-04 DIAGNOSIS — H401133 Primary open-angle glaucoma, bilateral, severe stage: Secondary | ICD-10-CM | POA: Diagnosis not present

## 2019-07-04 NOTE — Telephone Encounter (Signed)
Tried to call patient and patient's daughter, both numbers VM are full

## 2019-07-08 NOTE — Telephone Encounter (Signed)
Patient is returning your call in regards to [facility] PT

## 2019-07-08 NOTE — Telephone Encounter (Signed)
Send a message through [employer]

## 2019-08-05 ENCOUNTER — Ambulatory Visit
Admission: RE | Admit: 2019-08-05 | Discharge: 2019-08-05 | Disposition: A | Discharge status: Home / Self Care | Payer: Medicare HMO | Source: Ambulatory Visit | Attending: Primary Care | Admitting: Primary Care

## 2019-08-05 ENCOUNTER — Other Ambulatory Visit: Payer: Self-pay | Admitting: Primary Care

## 2019-08-05 ENCOUNTER — Other Ambulatory Visit: Payer: Self-pay

## 2019-08-05 DIAGNOSIS — G4700 Insomnia, unspecified: Secondary | ICD-10-CM

## 2019-08-05 DIAGNOSIS — Z780 Asymptomatic menopausal state: Secondary | ICD-10-CM | POA: Diagnosis not present

## 2019-08-05 DIAGNOSIS — E2839 Other primary ovarian failure: Secondary | ICD-10-CM

## 2019-08-05 DIAGNOSIS — M8589 Other specified disorders of bone density and structure, multiple sites: Secondary | ICD-10-CM | POA: Diagnosis not present

## 2019-08-06 ENCOUNTER — Ambulatory Visit (INDEPENDENT_AMBULATORY_CARE_PROVIDER_SITE_OTHER): Payer: Medicare HMO | Admitting: Family Medicine

## 2019-08-06 ENCOUNTER — Encounter: Payer: Self-pay | Admitting: Family Medicine

## 2019-08-06 VITALS — Wt 150.0 lb

## 2019-08-06 DIAGNOSIS — J301 Allergic rhinitis due to pollen: Secondary | ICD-10-CM | POA: Diagnosis not present

## 2019-08-06 MED ORDER — BENZONATATE 100 MG PO CAPS: 100.0000 mg | ORAL_CAPSULE | Freq: Three times a day (TID) | ORAL | 0 refills | Status: DC | PRN

## 2019-08-06 NOTE — Progress Notes (Signed)
Virtual Visit via Telephone Note  I connected with Shannon Higgins on 08/06/19 at 11:40 AM EDT by telephone and verified that I am speaking with the correct person using two identifiers.   I discussed the limitations, risks, security and privacy concerns of performing an evaluation and management service by telephone and the availability of in person appointments. I also discussed with the patient that there may be a patient responsible charge related to this service. The patient expressed understanding and agreed to proceed.  Patient location: Home Provider Location: [facility] Participants: [name] and Ovid Curd   History of Present Illness: Chief Complaint  Patient presents with  . Cough    sx started on 08/01/2019. Congestion, sore chest from coughing, coughing up green phlegm, some pressure in the right ear. No chills. No fever. Has taking Nyquil    Cough This is a new problem. The current episode started in the past 7 days. The problem has been gradually improving. The cough is productive of purulent sputum. Associated symptoms include chest pain, ear pain, postnasal drip and rhinorrhea. Pertinent negatives include no chills, fever, headaches, myalgias, nasal congestion, sore throat, shortness of breath or wheezing. The symptoms are aggravated by pollens. Treatments tried: nyquil, mucinex. The treatment provided mild relief.   Takes zyrtec -- was out of town and did not have the medication Flonase - has been taking this   Coughing really bad, difficulty sleeping  Sick contact: yes - sister was just in the hospital  Loss of taste/smell: low appetite, but no change in symptoms  Had 2 doses of the covid vaccine  Review of Systems  Constitutional: Negative for chills and fever.  HENT: Positive for ear pain, postnasal drip and rhinorrhea. Negative for sore throat.   Eyes:       Itchy eyes/watery eyes  Respiratory: Positive for cough. Negative for shortness of  breath and wheezing.   Cardiovascular: Positive for chest pain.  Gastrointestinal: Negative for constipation, diarrhea, nausea and vomiting.  Musculoskeletal: Negative for myalgias.  Neurological: Negative for headaches.      Observations/Objective: Wt 150 lb (68 kg) Comment: per patient  BMI 25.75 kg/m   Phone visit:  Patient speaking in complete sentences No distress Alert and oriented Normal mood Coughing intermittently on the phone call  Assessment and Plan: Problem List Items Addressed This Visit    None    Visit Diagnoses    Seasonal allergic rhinitis due to pollen    -  Primary   Relevant Medications   benzonatate (TESSALON PERLES) 100 MG capsule     Discussed taking zyrtec x 2 days. Could try albuterol for coughing spells to see if that improves symptoms.   If no improvement in 2 days would recommend Covid testing. If negative and no improvement by next week could try course of Abx.   Follow Up Instructions:  Return if symptoms worsen or fail to improve.   I discussed the assessment and treatment plan with the patient. The patient was provided an opportunity to ask questions and all were answered. The patient agreed with the plan and demonstrated an understanding of the instructions.   The patient was advised to call back or seek an in-person evaluation if the symptoms worsen or if the condition fails to improve as anticipated.  I provided 11 minutes of non-face-to-face time during this encounter.    [name], MD

## 2019-08-23 ENCOUNTER — Ambulatory Visit: Payer: Medicare HMO | Admitting: Neurology

## 2019-08-30 ENCOUNTER — Telehealth: Payer: Self-pay | Admitting: Primary Care

## 2019-08-30 DIAGNOSIS — M792 Neuralgia and neuritis, unspecified: Secondary | ICD-10-CM

## 2019-08-30 MED ORDER — GABAPENTIN 800 MG PO TABS: ORAL_TABLET | ORAL | 0 refills | Status: DC

## 2019-08-30 NOTE — Telephone Encounter (Signed)
Tried to call patient but VM is full. Called patient's daughter and was told that [facility] mail order must be behind. [facility]   Sent as requested.

## 2019-08-30 NOTE — Telephone Encounter (Signed)
Patient called requesting a refill  gabapentin (NEURONTIN) 800 MG tablet   She stated that she is completely out of medication and needs this asap before the weekend      [facility] [place]

## 2019-09-02 ENCOUNTER — Encounter: Payer: Self-pay | Admitting: Primary Care

## 2019-09-02 ENCOUNTER — Ambulatory Visit (INDEPENDENT_AMBULATORY_CARE_PROVIDER_SITE_OTHER): Payer: Medicare HMO | Admitting: Primary Care

## 2019-09-02 ENCOUNTER — Other Ambulatory Visit: Payer: Self-pay

## 2019-09-02 VITALS — BP 126/70 | HR 64 | Temp 95.2°F | Ht 64.0 in | Wt 151.2 lb

## 2019-09-02 DIAGNOSIS — R296 Repeated falls: Secondary | ICD-10-CM | POA: Diagnosis not present

## 2019-09-02 DIAGNOSIS — M792 Neuralgia and neuritis, unspecified: Secondary | ICD-10-CM | POA: Diagnosis not present

## 2019-09-02 DIAGNOSIS — R2689 Other abnormalities of gait and mobility: Secondary | ICD-10-CM | POA: Insufficient documentation

## 2019-09-02 DIAGNOSIS — E119 Type 2 diabetes mellitus without complications: Secondary | ICD-10-CM | POA: Diagnosis not present

## 2019-09-02 DIAGNOSIS — E039 Hypothyroidism, unspecified: Secondary | ICD-10-CM | POA: Diagnosis not present

## 2019-09-02 DIAGNOSIS — R42 Dizziness and giddiness: Secondary | ICD-10-CM | POA: Diagnosis not present

## 2019-09-02 HISTORY — DX: Other abnormalities of gait and mobility: R26.89

## 2019-09-02 LAB — POCT GLYCOSYLATED HEMOGLOBIN (HGB A1C): Hemoglobin A1C: 5.9 % — AB (ref 4.0–5.6)

## 2019-09-02 MED ORDER — MECLIZINE HCL 25 MG PO TABS: 25.0000 mg | ORAL_TABLET | Freq: Two times a day (BID) | ORAL | 0 refills | Status: DC | PRN

## 2019-09-02 NOTE — Assessment & Plan Note (Signed)
She is taking levothyroxine correctly. Repeat TSH pending. 

## 2019-09-02 NOTE — Assessment & Plan Note (Signed)
Continued and progressing. Already on 800 mg of gabapentin TID, given age I am reticent to increase further.  Will have her hold Crestor to see if this helps to reduce joint pain. If so then change to pravastatin.   Also consider resuming meloxicam daily pending BMP results.   She will update in a few weeks.

## 2019-09-02 NOTE — Assessment & Plan Note (Signed)
With ambulation. History of stroke, vertigo, neuropathic foot pain, recurrent falls. Referral placed for home health PT for evaluation and treatment.

## 2019-09-02 NOTE — Progress Notes (Signed)
Subjective:    Patient ID: Shannon Higgins, female    DOB: Jan 22, 1935, 85 y.o.   MRN: [id number]  HPI  This visit occurred during the SARS-CoV-2 public health emergency.  Safety protocols were in place, including screening questions prior to the visit, additional usage of staff PPE, and extensive cleaning of exam room while observing appropriate contact time as indicated for disinfecting solutions.   Ms. Berch is a 85 year old female with a history of TAI, OSA, hypothyroidism, type 2 diabetes, recurrent UTI, anemia, glaucoma, neuropathic pain, chronic low back pain, chronic headaches, depression who presents today for follow up of diabetes. She is also needing repeat TSH.   1) Type 2 Diabetes:   Current medications include: metformin 500 mg BID.   Last A1C: November 2020 6.6, today 5.9. Last Eye Exam: UTD Last Foot Exam: Due Pneumonia Vaccination: UTD ACE/ARB: None. Urine microalbumin due Statin: Crestor   2) Hypothyroidism: Currently managed on levothyroxine 112 mcg. She is taking her levothyroxine every morning on an empty stomach with water, no food or other medications for 30 minutes. She is taking PPI four hours later. She is due for repeat TSH today.  BP Readings from Last 3 Encounters:  09/02/19 126/70  03/04/19 126/62  10/25/18 (!) 130/58    3) Neuropathic pain/Joint pain: She continues to experience chronic joint aches and pain to the dorsal and plantar feet. Describes this pain as burning and achy. She has no pain elsewhere. She is taking gabapentin 800 mg three times daily and would like to increase the dose further. She is not taking anything OTC for symptoms. She is compliant to duloxetine.   4) Imbalance: She's noticed feeling more off balance over the last several months. Will have to hold onto the wall when ambulating in her home. She is not using her walker. She once had home health PT which helped to improve her imbalance, she would like to try this again.     Review of Systems  Respiratory: Negative for shortness of breath.   Cardiovascular: Negative for chest pain.  Neurological: Positive for numbness.       Dorsal and plantar foot pain       Past Medical History:  Diagnosis Date  . Acute tubular injury of transplanted kidney ([place]) 06/14/2016  . AKI (acute kidney injury) ([place]) 06/14/2016  . Asthma   . Breast cancer ([place])   . CAP (community acquired pneumonia) 04/29/2012  . Chronic sinusitis   . Confusion 12/06/2016  . Diabetes mellitus without complication ([place])   . GERD (gastroesophageal reflux disease)   . History of ankle fracture 05/14/2018   Last Assessment & Plan:  With a recent fall. Right medial malleous. Has ortho follow up soon, splinted now and tylenol helps her pain  . History of recurrent UTIs   . Hypertension   . Hypokalemia 04/26/2012  . Hyponatremia   . Insomnia   . Migraine   . Neuropathic pain   . Pneumonia   . Rheumatoid arthritis ([place])   . Sepsis ([place]) 04/26/2012  . Sleep apnea   . Stroke ([place])   . Thyroid disease      Social History   Socioeconomic History  . Marital status: Widowed    Spouse name: Not on file  . Number of children: Not on file  . Years of education: Not on file  . Highest education level: Not on file  Occupational History  . Not on file  Tobacco Use  . Smoking status:  Never Smoker  . Smokeless tobacco: Never Used  Substance and Sexual Activity  . Alcohol use: No  . Drug use: No  . Sexual activity: Not on file  Other Topics Concern  . Not on file  Social History Narrative   Pt lives in 1 story home with her daughter, [name] and [name]'s husband   Has 2 adult daughters   Highest level of education: some college   Worked mainly as Web designer.   Social Determinants of Health   Financial Resource Strain: Low Risk   . Difficulty of Paying Living Expenses: Not hard at all  Food Insecurity: No Food Insecurity  . Worried About Charity fundraiser in the Last Year:  Never true  . Ran Out of Food in the Last Year: Never true  Transportation Needs: No Transportation Needs  . Lack of Transportation (Medical): No  . Lack of Transportation (Non-Medical): No  Physical Activity: Inactive  . Days of Exercise per Week: 0 days  . Minutes of Exercise per Session: 0 min  Stress: No Stress Concern Present  . Feeling of Stress : Not at all  Social Connections: Somewhat Isolated  . Frequency of Communication with Friends and Family: More than three times a week  . Frequency of Social Gatherings with Friends and Family: Never  . Attends Religious Services: More than 4 times per year  . Active Member of Clubs or Organizations: No  . Attends Archivist Meetings: Never  . Marital Status: Widowed  Intimate Partner Violence: Not At Risk  . Fear of Current or Ex-Partner: No  . Emotionally Abused: No  . Physically Abused: No  . Sexually Abused: No    Past Surgical History:  Procedure Laterality Date  . ABDOMINAL HYSTERECTOMY    . BACK SURGERY    . BREAST BIOPSY Right   . BREAST LUMPECTOMY Left 2015  . CHOLECYSTECTOMY    . KNEE SURGERY    . SHOULDER SURGERY      Family History  Problem Relation Age of Onset  . Diabetes Daughter   . Hypertension Daughter   . Fibromyalgia Daughter   . GER disease Daughter   . Fibromyalgia Daughter   . Crohn's disease Daughter   . Asthma Daughter   . Hypertension Mother     No Known Allergies  Current Outpatient Medications on File Prior to Visit  Medication Sig Dispense Refill  . Accu-Chek FastClix Lancets MISC Use as instructed to test blood sugar daily 300 each 1  . ACCU-CHEK GUIDE test strip Use as instructed to blood sugar daily 300 each 1  . albuterol (VENTOLIN HFA) 108 (90 Base) MCG/ACT inhaler Inhale 2 puffs into the lungs every 4 (four) hours as needed for shortness of breath. 1 Inhaler 0  . aspirin 325 MG tablet Take 1 tablet (325 mg total) by mouth daily. 30 tablet 1  . benzonatate (TESSALON  PERLES) 100 MG capsule Take 1 capsule (100 mg total) by mouth 3 (three) times daily as needed for cough. 20 capsule 0  . blood glucose meter kit and supplies Dispense based on patient and insurance preference. Use up to 2 times daily as directed. (FOR ICD-10 E10.9, E11.9). 1 each 0  . cetirizine (ZYRTEC) 10 MG chewable tablet Chew 1 tablet by mouth once daily as needed for allergies. 90 tablet 0  . donepezil (ARICEPT) 10 MG tablet Take 1 tablet daily 90 tablet 3  . dorzolamide-timolol (COSOPT) 22.3-6.8 MG/ML ophthalmic solution Place 1 drop into both eyes  2 (two) times daily.    . DULoxetine (CYMBALTA) 60 MG capsule TAKE 1 CAPSULE EVERY DAY  FOR  DEPRESSION 90 capsule 1  . fluticasone (FLONASE) 50 MCG/ACT nasal spray USE 2 SPRAYS IN EACH NOSTRIL EVERY DAY 48 g 3  . gabapentin (NEURONTIN) 800 MG tablet TAKE 1 TABLET BY MOUTH THREE TIMES A DAY 90 tablet 0  . latanoprost (XALATAN) 0.005 % ophthalmic solution Place 1 drop into both eyes at bedtime.    [name] levothyroxine (SYNTHROID) 112 MCG tablet Take 1 tablet by mouth every morning on an empty stomach with water only.  No food or other medications for 30 minutes. 90 tablet 0  . meloxicam (MOBIC) 15 MG tablet TAKE 1 TABLET EVERY DAY AS NEEDED FOR PAIN 90 tablet 0  . metFORMIN (GLUCOPHAGE) 500 MG tablet TAKE 1 TABLET BY MOUTH TWICE DAILY WITH FOOD FOR DIABETES. 180 tablet 2  . Netarsudil Dimesylate (RHOPRESSA) 0.02 % SOLN INSTILL 1 DROP IN BOTH EYES EVERY DAY    . omeprazole (PRILOSEC) 20 MG capsule TAKE 1 CAPSULE BY MOUTH TWICE DAILY FOR HEARTBURN. 180 capsule 1  . pilocarpine (PILOCAR) 2 % ophthalmic solution     . propranolol (INDERAL) 80 MG tablet TAKE 1 TABLET BY MOUTH ONCE DAILY FOR BLOOD PRESSURE. 90 tablet 1  . Rimegepant Sulfate (NURTEC) 75 MG TBDP Take 1 tablet by mouth as needed. Take 1 tablet as needed for migraine. Do not take more than 1 in 24 hours. 10 tablet 5  . rosuvastatin (CRESTOR) 10 MG tablet TAKE 1 TABLET BY MOUTH ONCE DAILY FOR  CHOLESTEROL. 90 tablet 2  . SUMAtriptan (IMITREX) 25 MG tablet Take as needed for migraine. Do not take more than twice a week 24 tablet 3  . traZODone (DESYREL) 100 MG tablet TAKE 2 TABLETS AT BEDTIME AS NEEDED FOR SLEEP 180 tablet 0   No current facility-administered medications on file prior to visit.    BP 126/70   Pulse 64   Temp (!) 95.2 F (35.1 C) (Temporal)   Ht 5' 4"  (1.626 m)   Wt 151 lb 4 oz (68.6 kg)   SpO2 98%   BMI 25.96 kg/m    Objective:   Physical Exam  Constitutional: She is oriented to person, place, and time. She appears well-nourished.  Cardiovascular: Normal rate and regular rhythm.  Respiratory: Effort normal and breath sounds normal.  Musculoskeletal:     Comments: Ambulates well with assistance.   Neurological: She is alert and oriented to person, place, and time.  Skin: Skin is warm and dry.           Assessment & Plan:

## 2019-09-02 NOTE — Patient Instructions (Signed)
Stop by the lab prior to leaving today. I will notify you of your results once received.   Stop Metformin tablets for diabetes.   Hold your Crestor for 2 weeks to see if this helps with your foot pain.  You will be contacted regarding your home health physical therapy.  Please let us know if you have not been contacted within two weeks.   Please update me regarding your pain as discussed.  Schedule a lab only appointment for 3 months for diabetes check.  It was a pleasure to see you today!

## 2019-09-02 NOTE — Assessment & Plan Note (Signed)
Well controlled on current regimen with A1C today of 5.9. Given her age and frailty we will stop metformin 500 mg BID and recheck A1C in 3 months.   Urine microalbumin due in 3 months. Foot exam next visit. Managed on statin. pneumonia vaccination UTD.  Repeat A1C in 3 months.

## 2019-09-03 LAB — BASIC METABOLIC PANEL
BUN: 12 mg/dL (ref 6–23)
CO2: 29 mEq/L (ref 19–32)
Calcium: 9.6 mg/dL (ref 8.4–10.5)
Chloride: 101 mEq/L (ref 96–112)
Creatinine, Ser: 0.88 mg/dL (ref 0.40–1.20)
GFR: 61.47 mL/min (ref >60.00)
Glucose, Bld: 98 mg/dL (ref 70–99)
Potassium: 4.3 mEq/L (ref 3.5–5.1)
Sodium: 138 mEq/L (ref 135–145)

## 2019-09-03 LAB — TSH: TSH: 0.93 u[IU]/mL (ref 0.35–4.50)

## 2019-09-11 DIAGNOSIS — E039 Hypothyroidism, unspecified: Secondary | ICD-10-CM | POA: Diagnosis not present

## 2019-09-11 DIAGNOSIS — E119 Type 2 diabetes mellitus without complications: Secondary | ICD-10-CM | POA: Diagnosis not present

## 2019-09-11 DIAGNOSIS — I10 Essential (primary) hypertension: Secondary | ICD-10-CM | POA: Diagnosis not present

## 2019-09-11 DIAGNOSIS — M069 Rheumatoid arthritis, unspecified: Secondary | ICD-10-CM | POA: Diagnosis not present

## 2019-09-11 DIAGNOSIS — D649 Anemia, unspecified: Secondary | ICD-10-CM | POA: Diagnosis not present

## 2019-09-11 DIAGNOSIS — H409 Unspecified glaucoma: Secondary | ICD-10-CM | POA: Diagnosis not present

## 2019-09-11 DIAGNOSIS — M792 Neuralgia and neuritis, unspecified: Secondary | ICD-10-CM | POA: Diagnosis not present

## 2019-09-11 DIAGNOSIS — M47816 Spondylosis without myelopathy or radiculopathy, lumbar region: Secondary | ICD-10-CM | POA: Diagnosis not present

## 2019-09-11 DIAGNOSIS — J45909 Unspecified asthma, uncomplicated: Secondary | ICD-10-CM | POA: Diagnosis not present

## 2019-09-12 ENCOUNTER — Telehealth: Payer: Self-pay | Admitting: Primary Care

## 2019-09-12 NOTE — Telephone Encounter (Signed)
Tiffany,[facility], called.  She's requesting verbal orders for physical therapy 1 x a week for 1 wk and 2 x a week for 5 weeks, and 1 x a week for 3 weeks.

## 2019-09-12 NOTE — Telephone Encounter (Addendum)
Message left for Tiffany to return my call.

## 2019-09-12 NOTE — Telephone Encounter (Signed)
Approved.  

## 2019-09-13 ENCOUNTER — Ambulatory Visit (INDEPENDENT_AMBULATORY_CARE_PROVIDER_SITE_OTHER): Payer: Medicare HMO | Admitting: Neurology

## 2019-09-13 ENCOUNTER — Other Ambulatory Visit: Payer: Self-pay

## 2019-09-13 ENCOUNTER — Ambulatory Visit: Payer: Medicare HMO | Admitting: Neurology

## 2019-09-13 DIAGNOSIS — G43709 Chronic migraine without aura, not intractable, without status migrainosus: Secondary | ICD-10-CM | POA: Diagnosis not present

## 2019-09-13 DIAGNOSIS — H401133 Primary open-angle glaucoma, bilateral, severe stage: Secondary | ICD-10-CM | POA: Diagnosis not present

## 2019-09-13 MED ORDER — ONABOTULINUMTOXINA 100 UNITS IJ SOLR
200.0000 [IU] | Freq: Once | INTRAMUSCULAR | Status: AC
  Administered 2019-09-13: 155 [IU] via INTRAMUSCULAR

## 2019-09-13 NOTE — Progress Notes (Signed)
[facility]   Procedure Note Botox  Attending: Dr. Metta Clines  Preoperative Diagnosis(es): Chronic migraine  Consent obtained from: The patient Benefits discussed included, but were not limited to decreased muscle tightness, increased joint range of motion, and decreased pain.  Risk discussed included, but were not limited pain and discomfort, bleeding, bruising, excessive weakness, venous thrombosis, muscle atrophy and dysphagia.  Anticipated outcomes of the procedure as well as he risks and benefits of the alternatives to the procedure, and the roles and tasks of the personnel to be involved, were discussed with the patient, and the patient consents to the procedure and agrees to proceed. A copy of the patient medication guide was given to the patient which explains the blackbox warning.  Patients identity and treatment sites confirmed Yes.  .  Details of Procedure: Skin was cleaned with alcohol. Prior to injection, the needle plunger was aspirated to make sure the needle was not within a blood vessel.  There was no blood retrieved on aspiration.    Following is a summary of the muscles injected  And the amount of Botulinum toxin used:  Dilution 200 units of Botox was reconstituted with 4 ml of preservative free normal saline. Time of reconstitution: At the time of the office visit (<30 minutes prior to injection)   Injections  155 total units of Botox was injected with a 30 gauge needle.  Injection Sites: L occipitalis: 15 units- 3 sites  R occiptalis: 15 units- 3 sites  L upper trapezius: 15 units- 3 sites R upper trapezius: 15 units- 3 sits          L paraspinal: 10 units- 2 sites R paraspinal: 10 units- 2 sites  Face L frontalis(2 injection sites):10 units   R frontalis(2 injection sites):10 units         L corrugator: 5 units   R corrugator: 5 units           Procerus: 5 units   L temporalis: 20 units R temporalis: 20 units   Agent:  200 units of botulinum Type  A (Onobotulinum Toxin type A) was reconstituted with 4 ml of preservative free normal saline.  Time of reconstitution: At the time of the office visit (<30 minutes prior to injection)     Total injected (Units): 155  Total wasted (Units): 8  Patient tolerated procedure well without complications.   Reinjection is anticipated in 3 months.

## 2019-09-13 NOTE — Telephone Encounter (Signed)
Gave the approval for the verbal orders 

## 2019-09-17 DIAGNOSIS — H409 Unspecified glaucoma: Secondary | ICD-10-CM | POA: Diagnosis not present

## 2019-09-17 DIAGNOSIS — M47816 Spondylosis without myelopathy or radiculopathy, lumbar region: Secondary | ICD-10-CM | POA: Diagnosis not present

## 2019-09-17 DIAGNOSIS — M792 Neuralgia and neuritis, unspecified: Secondary | ICD-10-CM | POA: Diagnosis not present

## 2019-09-17 DIAGNOSIS — D649 Anemia, unspecified: Secondary | ICD-10-CM | POA: Diagnosis not present

## 2019-09-17 DIAGNOSIS — J45909 Unspecified asthma, uncomplicated: Secondary | ICD-10-CM | POA: Diagnosis not present

## 2019-09-17 DIAGNOSIS — I10 Essential (primary) hypertension: Secondary | ICD-10-CM | POA: Diagnosis not present

## 2019-09-17 DIAGNOSIS — E119 Type 2 diabetes mellitus without complications: Secondary | ICD-10-CM | POA: Diagnosis not present

## 2019-09-17 DIAGNOSIS — E039 Hypothyroidism, unspecified: Secondary | ICD-10-CM | POA: Diagnosis not present

## 2019-09-17 DIAGNOSIS — M069 Rheumatoid arthritis, unspecified: Secondary | ICD-10-CM | POA: Diagnosis not present

## 2019-09-19 DIAGNOSIS — H409 Unspecified glaucoma: Secondary | ICD-10-CM | POA: Diagnosis not present

## 2019-09-19 DIAGNOSIS — I10 Essential (primary) hypertension: Secondary | ICD-10-CM | POA: Diagnosis not present

## 2019-09-19 DIAGNOSIS — D649 Anemia, unspecified: Secondary | ICD-10-CM | POA: Diagnosis not present

## 2019-09-19 DIAGNOSIS — E119 Type 2 diabetes mellitus without complications: Secondary | ICD-10-CM | POA: Diagnosis not present

## 2019-09-19 DIAGNOSIS — M47816 Spondylosis without myelopathy or radiculopathy, lumbar region: Secondary | ICD-10-CM | POA: Diagnosis not present

## 2019-09-19 DIAGNOSIS — J45909 Unspecified asthma, uncomplicated: Secondary | ICD-10-CM | POA: Diagnosis not present

## 2019-09-19 DIAGNOSIS — E039 Hypothyroidism, unspecified: Secondary | ICD-10-CM | POA: Diagnosis not present

## 2019-09-19 DIAGNOSIS — M069 Rheumatoid arthritis, unspecified: Secondary | ICD-10-CM | POA: Diagnosis not present

## 2019-09-19 DIAGNOSIS — M792 Neuralgia and neuritis, unspecified: Secondary | ICD-10-CM | POA: Diagnosis not present

## 2019-09-24 DIAGNOSIS — J45909 Unspecified asthma, uncomplicated: Secondary | ICD-10-CM | POA: Diagnosis not present

## 2019-09-24 DIAGNOSIS — M792 Neuralgia and neuritis, unspecified: Secondary | ICD-10-CM | POA: Diagnosis not present

## 2019-09-24 DIAGNOSIS — M47816 Spondylosis without myelopathy or radiculopathy, lumbar region: Secondary | ICD-10-CM | POA: Diagnosis not present

## 2019-09-24 DIAGNOSIS — E119 Type 2 diabetes mellitus without complications: Secondary | ICD-10-CM | POA: Diagnosis not present

## 2019-09-24 DIAGNOSIS — M069 Rheumatoid arthritis, unspecified: Secondary | ICD-10-CM | POA: Diagnosis not present

## 2019-09-24 DIAGNOSIS — D649 Anemia, unspecified: Secondary | ICD-10-CM | POA: Diagnosis not present

## 2019-09-24 DIAGNOSIS — E039 Hypothyroidism, unspecified: Secondary | ICD-10-CM | POA: Diagnosis not present

## 2019-09-24 DIAGNOSIS — I10 Essential (primary) hypertension: Secondary | ICD-10-CM | POA: Diagnosis not present

## 2019-09-24 DIAGNOSIS — H409 Unspecified glaucoma: Secondary | ICD-10-CM | POA: Diagnosis not present

## 2019-09-27 DIAGNOSIS — D649 Anemia, unspecified: Secondary | ICD-10-CM | POA: Diagnosis not present

## 2019-09-27 DIAGNOSIS — H409 Unspecified glaucoma: Secondary | ICD-10-CM | POA: Diagnosis not present

## 2019-09-27 DIAGNOSIS — M47816 Spondylosis without myelopathy or radiculopathy, lumbar region: Secondary | ICD-10-CM | POA: Diagnosis not present

## 2019-09-27 DIAGNOSIS — M069 Rheumatoid arthritis, unspecified: Secondary | ICD-10-CM | POA: Diagnosis not present

## 2019-09-27 DIAGNOSIS — M792 Neuralgia and neuritis, unspecified: Secondary | ICD-10-CM | POA: Diagnosis not present

## 2019-09-27 DIAGNOSIS — E039 Hypothyroidism, unspecified: Secondary | ICD-10-CM | POA: Diagnosis not present

## 2019-09-27 DIAGNOSIS — J45909 Unspecified asthma, uncomplicated: Secondary | ICD-10-CM | POA: Diagnosis not present

## 2019-09-27 DIAGNOSIS — E119 Type 2 diabetes mellitus without complications: Secondary | ICD-10-CM | POA: Diagnosis not present

## 2019-09-27 DIAGNOSIS — I10 Essential (primary) hypertension: Secondary | ICD-10-CM | POA: Diagnosis not present

## 2019-10-01 ENCOUNTER — Telehealth: Payer: Self-pay | Admitting: Primary Care

## 2019-10-01 DIAGNOSIS — D649 Anemia, unspecified: Secondary | ICD-10-CM | POA: Diagnosis not present

## 2019-10-01 DIAGNOSIS — M069 Rheumatoid arthritis, unspecified: Secondary | ICD-10-CM | POA: Diagnosis not present

## 2019-10-01 DIAGNOSIS — H409 Unspecified glaucoma: Secondary | ICD-10-CM | POA: Diagnosis not present

## 2019-10-01 DIAGNOSIS — E119 Type 2 diabetes mellitus without complications: Secondary | ICD-10-CM | POA: Diagnosis not present

## 2019-10-01 DIAGNOSIS — M47816 Spondylosis without myelopathy or radiculopathy, lumbar region: Secondary | ICD-10-CM | POA: Diagnosis not present

## 2019-10-01 DIAGNOSIS — J45909 Unspecified asthma, uncomplicated: Secondary | ICD-10-CM | POA: Diagnosis not present

## 2019-10-01 DIAGNOSIS — I10 Essential (primary) hypertension: Secondary | ICD-10-CM | POA: Diagnosis not present

## 2019-10-01 DIAGNOSIS — E039 Hypothyroidism, unspecified: Secondary | ICD-10-CM | POA: Diagnosis not present

## 2019-10-01 DIAGNOSIS — M792 Neuralgia and neuritis, unspecified: Secondary | ICD-10-CM | POA: Diagnosis not present

## 2019-10-01 NOTE — Telephone Encounter (Signed)
Tiffany, Physical Therapist with [facility] called today She stated that their team is trying to get the patient's chart processed and needs a specific diagnosis. She stated that she is needing approval on the Diagnosis. That it is falls associated with diabetic neuropathy.   Tiffany call back # [phone]

## 2019-10-01 NOTE — Telephone Encounter (Signed)
Spoken and notified Shannon Higgins of Tawni Millers comments. Verbalized understanding and will call if problem

## 2019-10-01 NOTE — Telephone Encounter (Signed)
Yes, imbalance is likely from osteoarthritis and neuropathy.

## 2019-10-04 DIAGNOSIS — J45909 Unspecified asthma, uncomplicated: Secondary | ICD-10-CM | POA: Diagnosis not present

## 2019-10-04 DIAGNOSIS — I10 Essential (primary) hypertension: Secondary | ICD-10-CM | POA: Diagnosis not present

## 2019-10-04 DIAGNOSIS — M47816 Spondylosis without myelopathy or radiculopathy, lumbar region: Secondary | ICD-10-CM | POA: Diagnosis not present

## 2019-10-04 DIAGNOSIS — D649 Anemia, unspecified: Secondary | ICD-10-CM | POA: Diagnosis not present

## 2019-10-04 DIAGNOSIS — H409 Unspecified glaucoma: Secondary | ICD-10-CM | POA: Diagnosis not present

## 2019-10-04 DIAGNOSIS — M069 Rheumatoid arthritis, unspecified: Secondary | ICD-10-CM | POA: Diagnosis not present

## 2019-10-04 DIAGNOSIS — E039 Hypothyroidism, unspecified: Secondary | ICD-10-CM | POA: Diagnosis not present

## 2019-10-04 DIAGNOSIS — M792 Neuralgia and neuritis, unspecified: Secondary | ICD-10-CM | POA: Diagnosis not present

## 2019-10-04 DIAGNOSIS — E119 Type 2 diabetes mellitus without complications: Secondary | ICD-10-CM | POA: Diagnosis not present

## 2019-10-07 DIAGNOSIS — E039 Hypothyroidism, unspecified: Secondary | ICD-10-CM | POA: Diagnosis not present

## 2019-10-07 DIAGNOSIS — M47816 Spondylosis without myelopathy or radiculopathy, lumbar region: Secondary | ICD-10-CM | POA: Diagnosis not present

## 2019-10-07 DIAGNOSIS — D649 Anemia, unspecified: Secondary | ICD-10-CM | POA: Diagnosis not present

## 2019-10-07 DIAGNOSIS — J45909 Unspecified asthma, uncomplicated: Secondary | ICD-10-CM | POA: Diagnosis not present

## 2019-10-07 DIAGNOSIS — H409 Unspecified glaucoma: Secondary | ICD-10-CM | POA: Diagnosis not present

## 2019-10-07 DIAGNOSIS — M792 Neuralgia and neuritis, unspecified: Secondary | ICD-10-CM | POA: Diagnosis not present

## 2019-10-07 DIAGNOSIS — M069 Rheumatoid arthritis, unspecified: Secondary | ICD-10-CM | POA: Diagnosis not present

## 2019-10-07 DIAGNOSIS — I10 Essential (primary) hypertension: Secondary | ICD-10-CM | POA: Diagnosis not present

## 2019-10-07 DIAGNOSIS — E119 Type 2 diabetes mellitus without complications: Secondary | ICD-10-CM | POA: Diagnosis not present

## 2019-10-09 DIAGNOSIS — D649 Anemia, unspecified: Secondary | ICD-10-CM | POA: Diagnosis not present

## 2019-10-09 DIAGNOSIS — I10 Essential (primary) hypertension: Secondary | ICD-10-CM | POA: Diagnosis not present

## 2019-10-09 DIAGNOSIS — E039 Hypothyroidism, unspecified: Secondary | ICD-10-CM | POA: Diagnosis not present

## 2019-10-09 DIAGNOSIS — M069 Rheumatoid arthritis, unspecified: Secondary | ICD-10-CM | POA: Diagnosis not present

## 2019-10-09 DIAGNOSIS — J45909 Unspecified asthma, uncomplicated: Secondary | ICD-10-CM | POA: Diagnosis not present

## 2019-10-09 DIAGNOSIS — E119 Type 2 diabetes mellitus without complications: Secondary | ICD-10-CM | POA: Diagnosis not present

## 2019-10-09 DIAGNOSIS — M47816 Spondylosis without myelopathy or radiculopathy, lumbar region: Secondary | ICD-10-CM | POA: Diagnosis not present

## 2019-10-09 DIAGNOSIS — M792 Neuralgia and neuritis, unspecified: Secondary | ICD-10-CM | POA: Diagnosis not present

## 2019-10-09 DIAGNOSIS — H409 Unspecified glaucoma: Secondary | ICD-10-CM | POA: Diagnosis not present

## 2019-10-11 DIAGNOSIS — E039 Hypothyroidism, unspecified: Secondary | ICD-10-CM | POA: Diagnosis not present

## 2019-10-11 DIAGNOSIS — J45909 Unspecified asthma, uncomplicated: Secondary | ICD-10-CM | POA: Diagnosis not present

## 2019-10-11 DIAGNOSIS — E119 Type 2 diabetes mellitus without complications: Secondary | ICD-10-CM | POA: Diagnosis not present

## 2019-10-11 DIAGNOSIS — D649 Anemia, unspecified: Secondary | ICD-10-CM | POA: Diagnosis not present

## 2019-10-11 DIAGNOSIS — I10 Essential (primary) hypertension: Secondary | ICD-10-CM | POA: Diagnosis not present

## 2019-10-11 DIAGNOSIS — H409 Unspecified glaucoma: Secondary | ICD-10-CM | POA: Diagnosis not present

## 2019-10-11 DIAGNOSIS — M47816 Spondylosis without myelopathy or radiculopathy, lumbar region: Secondary | ICD-10-CM | POA: Diagnosis not present

## 2019-10-11 DIAGNOSIS — M792 Neuralgia and neuritis, unspecified: Secondary | ICD-10-CM | POA: Diagnosis not present

## 2019-10-11 DIAGNOSIS — M069 Rheumatoid arthritis, unspecified: Secondary | ICD-10-CM | POA: Diagnosis not present

## 2019-10-15 DIAGNOSIS — H409 Unspecified glaucoma: Secondary | ICD-10-CM | POA: Diagnosis not present

## 2019-10-15 DIAGNOSIS — M47816 Spondylosis without myelopathy or radiculopathy, lumbar region: Secondary | ICD-10-CM | POA: Diagnosis not present

## 2019-10-15 DIAGNOSIS — J45909 Unspecified asthma, uncomplicated: Secondary | ICD-10-CM | POA: Diagnosis not present

## 2019-10-15 DIAGNOSIS — M792 Neuralgia and neuritis, unspecified: Secondary | ICD-10-CM | POA: Diagnosis not present

## 2019-10-15 DIAGNOSIS — E119 Type 2 diabetes mellitus without complications: Secondary | ICD-10-CM | POA: Diagnosis not present

## 2019-10-15 DIAGNOSIS — M069 Rheumatoid arthritis, unspecified: Secondary | ICD-10-CM | POA: Diagnosis not present

## 2019-10-15 DIAGNOSIS — I10 Essential (primary) hypertension: Secondary | ICD-10-CM | POA: Diagnosis not present

## 2019-10-15 DIAGNOSIS — E039 Hypothyroidism, unspecified: Secondary | ICD-10-CM | POA: Diagnosis not present

## 2019-10-15 DIAGNOSIS — D649 Anemia, unspecified: Secondary | ICD-10-CM | POA: Diagnosis not present

## 2019-10-16 ENCOUNTER — Ambulatory Visit: Payer: Medicare HMO | Admitting: Urology

## 2019-10-17 DIAGNOSIS — M069 Rheumatoid arthritis, unspecified: Secondary | ICD-10-CM | POA: Diagnosis not present

## 2019-10-17 DIAGNOSIS — J45909 Unspecified asthma, uncomplicated: Secondary | ICD-10-CM | POA: Diagnosis not present

## 2019-10-17 DIAGNOSIS — M47816 Spondylosis without myelopathy or radiculopathy, lumbar region: Secondary | ICD-10-CM | POA: Diagnosis not present

## 2019-10-17 DIAGNOSIS — H409 Unspecified glaucoma: Secondary | ICD-10-CM | POA: Diagnosis not present

## 2019-10-17 DIAGNOSIS — E119 Type 2 diabetes mellitus without complications: Secondary | ICD-10-CM | POA: Diagnosis not present

## 2019-10-17 DIAGNOSIS — I10 Essential (primary) hypertension: Secondary | ICD-10-CM | POA: Diagnosis not present

## 2019-10-17 DIAGNOSIS — M792 Neuralgia and neuritis, unspecified: Secondary | ICD-10-CM | POA: Diagnosis not present

## 2019-10-17 DIAGNOSIS — D649 Anemia, unspecified: Secondary | ICD-10-CM | POA: Diagnosis not present

## 2019-10-17 DIAGNOSIS — E039 Hypothyroidism, unspecified: Secondary | ICD-10-CM | POA: Diagnosis not present

## 2019-10-23 DIAGNOSIS — E119 Type 2 diabetes mellitus without complications: Secondary | ICD-10-CM | POA: Diagnosis not present

## 2019-10-23 DIAGNOSIS — M069 Rheumatoid arthritis, unspecified: Secondary | ICD-10-CM | POA: Diagnosis not present

## 2019-10-23 DIAGNOSIS — M47816 Spondylosis without myelopathy or radiculopathy, lumbar region: Secondary | ICD-10-CM | POA: Diagnosis not present

## 2019-10-23 DIAGNOSIS — H409 Unspecified glaucoma: Secondary | ICD-10-CM | POA: Diagnosis not present

## 2019-10-23 DIAGNOSIS — M792 Neuralgia and neuritis, unspecified: Secondary | ICD-10-CM | POA: Diagnosis not present

## 2019-10-23 DIAGNOSIS — I10 Essential (primary) hypertension: Secondary | ICD-10-CM | POA: Diagnosis not present

## 2019-10-23 DIAGNOSIS — J45909 Unspecified asthma, uncomplicated: Secondary | ICD-10-CM | POA: Diagnosis not present

## 2019-10-23 DIAGNOSIS — D649 Anemia, unspecified: Secondary | ICD-10-CM | POA: Diagnosis not present

## 2019-10-23 DIAGNOSIS — E039 Hypothyroidism, unspecified: Secondary | ICD-10-CM | POA: Diagnosis not present

## 2019-10-29 DIAGNOSIS — E039 Hypothyroidism, unspecified: Secondary | ICD-10-CM | POA: Diagnosis not present

## 2019-10-29 DIAGNOSIS — M792 Neuralgia and neuritis, unspecified: Secondary | ICD-10-CM | POA: Diagnosis not present

## 2019-10-29 DIAGNOSIS — M47816 Spondylosis without myelopathy or radiculopathy, lumbar region: Secondary | ICD-10-CM | POA: Diagnosis not present

## 2019-10-29 DIAGNOSIS — E119 Type 2 diabetes mellitus without complications: Secondary | ICD-10-CM | POA: Diagnosis not present

## 2019-10-29 DIAGNOSIS — J45909 Unspecified asthma, uncomplicated: Secondary | ICD-10-CM | POA: Diagnosis not present

## 2019-10-29 DIAGNOSIS — D649 Anemia, unspecified: Secondary | ICD-10-CM | POA: Diagnosis not present

## 2019-10-29 DIAGNOSIS — H409 Unspecified glaucoma: Secondary | ICD-10-CM | POA: Diagnosis not present

## 2019-10-29 DIAGNOSIS — M069 Rheumatoid arthritis, unspecified: Secondary | ICD-10-CM | POA: Diagnosis not present

## 2019-10-29 DIAGNOSIS — I10 Essential (primary) hypertension: Secondary | ICD-10-CM | POA: Diagnosis not present

## 2019-10-30 ENCOUNTER — Other Ambulatory Visit: Payer: Self-pay | Admitting: Primary Care

## 2019-10-30 DIAGNOSIS — K219 Gastro-esophageal reflux disease without esophagitis: Secondary | ICD-10-CM

## 2019-10-30 DIAGNOSIS — G8929 Other chronic pain: Secondary | ICD-10-CM

## 2019-10-30 DIAGNOSIS — M545 Low back pain, unspecified: Secondary | ICD-10-CM

## 2019-10-30 DIAGNOSIS — R42 Dizziness and giddiness: Secondary | ICD-10-CM

## 2019-10-30 MED ORDER — MELOXICAM 15 MG PO TABS: 15.0000 mg | ORAL_TABLET | Freq: Every day | ORAL | 0 refills | Status: DC | PRN

## 2019-10-30 MED ORDER — FLUTICASONE PROPIONATE 50 MCG/ACT NA SUSP: 1.0000 | Freq: Every day | NASAL | 0 refills | Status: DC

## 2019-10-30 MED ORDER — OMEPRAZOLE 20 MG PO CPDR: DELAYED_RELEASE_CAPSULE | ORAL | 1 refills | Status: DC

## 2019-10-30 NOTE — Telephone Encounter (Signed)
Refills sent to pharmacy. 

## 2019-10-30 NOTE — Telephone Encounter (Signed)
Shannon Higgins called to check status of refill request This was faxed to [place] on 6/23  They are requesting the following medications   fluticasone (FLONASE) 50 MCG/ACT nasal spray meloxicam (MOBIC) 15 MG tablet meclizine (ANTIVERT) 25 MG tablet omeprazole (PRILOSEC) 20 MG capsule SUMAtriptan (IMITREX) 25 MG tablet donepezil (ARICEPT) 10 MG tablet

## 2019-10-30 NOTE — Telephone Encounter (Signed)
Please advise. There are only 3 Rx that ar prescribed here. Ok to refill these 3.

## 2019-10-30 NOTE — Telephone Encounter (Addendum)
I do not have any Rx from [place] with the date of 6/23 at my desk. Will check these these Rx. If due for a refill and if they are from [place], we will sent sent accordingly.

## 2019-11-02 ENCOUNTER — Other Ambulatory Visit: Payer: Self-pay | Admitting: Primary Care

## 2019-11-02 DIAGNOSIS — G4700 Insomnia, unspecified: Secondary | ICD-10-CM

## 2019-11-06 DIAGNOSIS — M792 Neuralgia and neuritis, unspecified: Secondary | ICD-10-CM | POA: Diagnosis not present

## 2019-11-06 DIAGNOSIS — M069 Rheumatoid arthritis, unspecified: Secondary | ICD-10-CM | POA: Diagnosis not present

## 2019-11-06 DIAGNOSIS — H409 Unspecified glaucoma: Secondary | ICD-10-CM | POA: Diagnosis not present

## 2019-11-06 DIAGNOSIS — E119 Type 2 diabetes mellitus without complications: Secondary | ICD-10-CM | POA: Diagnosis not present

## 2019-11-06 DIAGNOSIS — D649 Anemia, unspecified: Secondary | ICD-10-CM | POA: Diagnosis not present

## 2019-11-06 DIAGNOSIS — E039 Hypothyroidism, unspecified: Secondary | ICD-10-CM | POA: Diagnosis not present

## 2019-11-06 DIAGNOSIS — J45909 Unspecified asthma, uncomplicated: Secondary | ICD-10-CM | POA: Diagnosis not present

## 2019-11-06 DIAGNOSIS — I10 Essential (primary) hypertension: Secondary | ICD-10-CM | POA: Diagnosis not present

## 2019-11-06 DIAGNOSIS — M47816 Spondylosis without myelopathy or radiculopathy, lumbar region: Secondary | ICD-10-CM | POA: Diagnosis not present

## 2019-11-10 DIAGNOSIS — D649 Anemia, unspecified: Secondary | ICD-10-CM | POA: Diagnosis not present

## 2019-11-10 DIAGNOSIS — M47816 Spondylosis without myelopathy or radiculopathy, lumbar region: Secondary | ICD-10-CM | POA: Diagnosis not present

## 2019-11-10 DIAGNOSIS — E119 Type 2 diabetes mellitus without complications: Secondary | ICD-10-CM | POA: Diagnosis not present

## 2019-11-10 DIAGNOSIS — H409 Unspecified glaucoma: Secondary | ICD-10-CM | POA: Diagnosis not present

## 2019-11-10 DIAGNOSIS — M792 Neuralgia and neuritis, unspecified: Secondary | ICD-10-CM | POA: Diagnosis not present

## 2019-11-10 DIAGNOSIS — E039 Hypothyroidism, unspecified: Secondary | ICD-10-CM | POA: Diagnosis not present

## 2019-11-10 DIAGNOSIS — J45909 Unspecified asthma, uncomplicated: Secondary | ICD-10-CM | POA: Diagnosis not present

## 2019-11-10 DIAGNOSIS — I10 Essential (primary) hypertension: Secondary | ICD-10-CM | POA: Diagnosis not present

## 2019-11-10 DIAGNOSIS — M069 Rheumatoid arthritis, unspecified: Secondary | ICD-10-CM | POA: Diagnosis not present

## 2019-11-13 DIAGNOSIS — H409 Unspecified glaucoma: Secondary | ICD-10-CM | POA: Diagnosis not present

## 2019-11-13 DIAGNOSIS — M069 Rheumatoid arthritis, unspecified: Secondary | ICD-10-CM | POA: Diagnosis not present

## 2019-11-13 DIAGNOSIS — D649 Anemia, unspecified: Secondary | ICD-10-CM | POA: Diagnosis not present

## 2019-11-13 DIAGNOSIS — M47816 Spondylosis without myelopathy or radiculopathy, lumbar region: Secondary | ICD-10-CM | POA: Diagnosis not present

## 2019-11-13 DIAGNOSIS — E119 Type 2 diabetes mellitus without complications: Secondary | ICD-10-CM | POA: Diagnosis not present

## 2019-11-13 DIAGNOSIS — E039 Hypothyroidism, unspecified: Secondary | ICD-10-CM | POA: Diagnosis not present

## 2019-11-13 DIAGNOSIS — J45909 Unspecified asthma, uncomplicated: Secondary | ICD-10-CM | POA: Diagnosis not present

## 2019-11-13 DIAGNOSIS — M792 Neuralgia and neuritis, unspecified: Secondary | ICD-10-CM | POA: Diagnosis not present

## 2019-11-13 DIAGNOSIS — I10 Essential (primary) hypertension: Secondary | ICD-10-CM | POA: Diagnosis not present

## 2019-11-14 ENCOUNTER — Ambulatory Visit: Payer: Medicare HMO | Admitting: Neurology

## 2019-11-19 DIAGNOSIS — M069 Rheumatoid arthritis, unspecified: Secondary | ICD-10-CM | POA: Diagnosis not present

## 2019-11-19 DIAGNOSIS — H409 Unspecified glaucoma: Secondary | ICD-10-CM | POA: Diagnosis not present

## 2019-11-19 DIAGNOSIS — M792 Neuralgia and neuritis, unspecified: Secondary | ICD-10-CM | POA: Diagnosis not present

## 2019-11-19 DIAGNOSIS — D649 Anemia, unspecified: Secondary | ICD-10-CM | POA: Diagnosis not present

## 2019-11-19 DIAGNOSIS — I10 Essential (primary) hypertension: Secondary | ICD-10-CM | POA: Diagnosis not present

## 2019-11-19 DIAGNOSIS — E119 Type 2 diabetes mellitus without complications: Secondary | ICD-10-CM | POA: Diagnosis not present

## 2019-11-19 DIAGNOSIS — E039 Hypothyroidism, unspecified: Secondary | ICD-10-CM | POA: Diagnosis not present

## 2019-11-19 DIAGNOSIS — J45909 Unspecified asthma, uncomplicated: Secondary | ICD-10-CM | POA: Diagnosis not present

## 2019-11-19 DIAGNOSIS — M47816 Spondylosis without myelopathy or radiculopathy, lumbar region: Secondary | ICD-10-CM | POA: Diagnosis not present

## 2019-11-21 ENCOUNTER — Ambulatory Visit (INDEPENDENT_AMBULATORY_CARE_PROVIDER_SITE_OTHER): Payer: Medicare HMO | Admitting: Family Medicine

## 2019-11-21 ENCOUNTER — Encounter: Payer: Self-pay | Admitting: Family Medicine

## 2019-11-21 ENCOUNTER — Other Ambulatory Visit: Payer: Self-pay

## 2019-11-21 VITALS — BP 140/80 | HR 69 | Temp 98.5°F | Ht 64.0 in | Wt 158.5 lb

## 2019-11-21 DIAGNOSIS — R350 Frequency of micturition: Secondary | ICD-10-CM

## 2019-11-21 DIAGNOSIS — N3000 Acute cystitis without hematuria: Secondary | ICD-10-CM | POA: Diagnosis not present

## 2019-11-21 LAB — POC URINALSYSI DIPSTICK (AUTOMATED)
Blood, UA: NEGATIVE
Glucose, UA: NEGATIVE
Leukocytes, UA: NEGATIVE
Nitrite, UA: POSITIVE
Protein, UA: POSITIVE — AB
Spec Grav, UA: >=1.03 — AB (ref 1.010–1.025)
Urobilinogen, UA: 1 E.U./dL
pH, UA: 6 (ref 5.0–8.0)

## 2019-11-21 LAB — POCT UA - MICROSCOPIC ONLY

## 2019-11-21 MED ORDER — SULFAMETHOXAZOLE-TRIMETHOPRIM 800-160 MG PO TABS: 1.0000 | ORAL_TABLET | Freq: Two times a day (BID) | ORAL | 0 refills | Status: DC

## 2019-11-21 NOTE — Progress Notes (Signed)
LO

## 2019-11-21 NOTE — Addendum Note (Signed)
Addended by: Carter Kitten on: 11/21/2019 12:26 PM   Modules accepted: Orders

## 2019-11-21 NOTE — Assessment & Plan Note (Signed)
Concentrated urine.. push fluids.  Send for culture but given typical symptoms and UA mildly positive in pt with history of frequent UTI... will treat with  Bactrim x 3 days.

## 2019-11-21 NOTE — Progress Notes (Signed)
Chief Complaint  Patient presents with  . Urinary Frequency    History of Present Illness: HPI  85 year old female patient with history of  diabetes and recurrent UTI presents with her daughter for possible UTI.  OTC azo UTI test was positive for nitrates and LE.  She reports 2 days of increased frequency, mild dysuria and urgency. She has noted urinary odor. Lower abd tenderness.  no fever.  No N/V.  No flank pain.. more low back pain.   No antibiotics recently.  Last UTI [facility]I in 03/2019, resistant to ampicillin  This visit occurred during the SARS-CoV-2 public health emergency.  Safety protocols were in place, including screening questions prior to the visit, additional usage of staff PPE, and extensive cleaning of exam room while observing appropriate contact time as indicated for disinfecting solutions.   COVID 19 screen:  No recent travel or known exposure to COVID19 The patient denies respiratory symptoms of COVID 19 at this time. The importance of social distancing was discussed today.     Review of Systems  Constitutional: Negative for chills and fever.  HENT: Negative for congestion and ear pain.   Eyes: Negative for pain and redness.  Respiratory: Negative for cough and shortness of breath.   Cardiovascular: Negative for chest pain, palpitations and leg swelling.  Gastrointestinal: Negative for abdominal pain, blood in stool, constipation, diarrhea, nausea and vomiting.  Genitourinary: Positive for dysuria, frequency and urgency.  Musculoskeletal: Negative for falls and myalgias.  Skin: Negative for rash.  Neurological: Negative for dizziness.  Psychiatric/Behavioral: Negative for depression. The patient is not nervous/anxious.       Past Medical History:  Diagnosis Date  . Acute tubular injury of transplanted kidney ([place]) 06/14/2016  . AKI (acute kidney injury) ([place]) 06/14/2016  . Asthma   . Breast cancer ([place])   . CAP (community acquired pneumonia)  04/29/2012  . Chronic sinusitis   . Confusion 12/06/2016  . Diabetes mellitus without complication ([place])   . GERD (gastroesophageal reflux disease)   . History of ankle fracture 05/14/2018   Last Assessment & Plan:  With a recent fall. Right medial malleous. Has ortho follow up soon, splinted now and tylenol helps her pain  . History of recurrent UTIs   . Hypertension   . Hypokalemia 04/26/2012  . Hyponatremia   . Insomnia   . Migraine   . Neuropathic pain   . Pneumonia   . Rheumatoid arthritis ([place])   . Sepsis ([place]) 04/26/2012  . Sleep apnea   . Stroke ([place])   . Thyroid disease     reports that she has never smoked. She has never used smokeless tobacco. She reports that she does not drink alcohol and does not use drugs.   Current Outpatient Medications:  .  Accu-Chek FastClix Lancets MISC, Use as instructed to test blood sugar daily, Disp: 300 each, Rfl: 1 .  ACCU-CHEK GUIDE test strip, Use as instructed to blood sugar daily, Disp: 300 each, Rfl: 1 .  albuterol (VENTOLIN HFA) 108 (90 Base) MCG/ACT inhaler, Inhale 2 puffs into the lungs every 4 (four) hours as needed for shortness of breath., Disp: 1 Inhaler, Rfl: 0 .  aspirin 325 MG tablet, Take 1 tablet (325 mg total) by mouth daily., Disp: 30 tablet, Rfl: 1 .  benzonatate (TESSALON PERLES) 100 MG capsule, Take 1 capsule (100 mg total) by mouth 3 (three) times daily as needed for cough., Disp: 20 capsule, Rfl: 0 .  blood glucose meter kit and supplies,  Dispense based on patient and insurance preference. Use up to 2 times daily as directed. (FOR ICD-10 E10.9, E11.9)., Disp: 1 each, Rfl: 0 .  cetirizine (ZYRTEC) 10 MG chewable tablet, Chew 1 tablet by mouth once daily as needed for allergies., Disp: 90 tablet, Rfl: 0 .  donepezil (ARICEPT) 10 MG tablet, Take 1 tablet daily, Disp: 90 tablet, Rfl: 3 .  dorzolamide-timolol (COSOPT) 22.3-6.8 MG/ML ophthalmic solution, Place 1 drop into both eyes 2 (two) times daily., Disp: , Rfl:  .   DULoxetine (CYMBALTA) 60 MG capsule, TAKE 1 CAPSULE EVERY DAY  FOR  DEPRESSION, Disp: 90 capsule, Rfl: 1 .  fluticasone (FLONASE) 50 MCG/ACT nasal spray, Place 1 spray into both nostrils daily., Disp: 48 g, Rfl: 0 .  gabapentin (NEURONTIN) 800 MG tablet, TAKE 1 TABLET BY MOUTH THREE TIMES A DAY, Disp: 90 tablet, Rfl: 0 .  latanoprost (XALATAN) 0.005 % ophthalmic solution, Place 1 drop into both eyes at bedtime., Disp: , Rfl:  .  levothyroxine (SYNTHROID) 112 MCG tablet, Take 1 tablet by mouth every morning on an empty stomach with water only.  No food or other medications for 30 minutes., Disp: 90 tablet, Rfl: 0 .  meclizine (ANTIVERT) 25 MG tablet, Take 1 tablet (25 mg total) by mouth 2 (two) times daily as needed for dizziness., Disp: 180 tablet, Rfl: 0 .  meloxicam (MOBIC) 15 MG tablet, Take 1 tablet (15 mg total) by mouth daily as needed for pain., Disp: 90 tablet, Rfl: 0 .  metFORMIN (GLUCOPHAGE) 500 MG tablet, TAKE 1 TABLET BY MOUTH TWICE DAILY WITH FOOD FOR DIABETES., Disp: 180 tablet, Rfl: 2 .  Netarsudil Dimesylate (RHOPRESSA) 0.02 % SOLN, INSTILL 1 DROP IN BOTH EYES EVERY DAY, Disp: , Rfl:  .  omeprazole (PRILOSEC) 20 MG capsule, TAKE 1 CAPSULE BY MOUTH TWICE DAILY FOR HEARTBURN., Disp: 180 capsule, Rfl: 1 .  pilocarpine (PILOCAR) 2 % ophthalmic solution, Place 1 drop into both eyes 4 (four) times daily. , Disp: , Rfl:  .  propranolol (INDERAL) 80 MG tablet, TAKE 1 TABLET BY MOUTH ONCE DAILY FOR BLOOD PRESSURE., Disp: 90 tablet, Rfl: 1 .  Rimegepant Sulfate (NURTEC) 75 MG TBDP, Take 1 tablet by mouth as needed. Take 1 tablet as needed for migraine. Do not take more than 1 in 24 hours., Disp: 10 tablet, Rfl: 5 .  rosuvastatin (CRESTOR) 10 MG tablet, TAKE 1 TABLET BY MOUTH ONCE DAILY FOR CHOLESTEROL., Disp: 90 tablet, Rfl: 2 .  SUMAtriptan (IMITREX) 25 MG tablet, Take as needed for migraine. Do not take more than twice a week, Disp: 24 tablet, Rfl: 3 .  traZODone (DESYREL) 100 MG tablet, TAKE  2 TABLETS AT BEDTIME AS NEEDED FOR SLEEP, Disp: 180 tablet, Rfl: 0   Observations/Objective: Blood pressure (!) 140/80, pulse 69, temperature 98.5 F (36.9 C), temperature source Temporal, height 5' 4" (1.626 m), weight 158 lb 8 oz (71.9 kg), SpO2 94 %.  Physical Exam Constitutional:      General: She is not in acute distress.    Appearance: Normal appearance. She is well-developed. She is not ill-appearing or toxic-appearing.  HENT:     Head: Normocephalic.     Right Ear: Hearing, tympanic membrane, ear canal and external ear normal. Tympanic membrane is not erythematous, retracted or bulging.     Left Ear: Hearing, tympanic membrane, ear canal and external ear normal. Tympanic membrane is not erythematous, retracted or bulging.     Nose: No mucosal edema or rhinorrhea.  Right Sinus: No maxillary sinus tenderness or frontal sinus tenderness.     Left Sinus: No maxillary sinus tenderness or frontal sinus tenderness.     Mouth/Throat:     Pharynx: Uvula midline.  Eyes:     General: Lids are normal. Lids are everted, no foreign bodies appreciated.     Conjunctiva/sclera: Conjunctivae normal.     Pupils: Pupils are equal, round, and reactive to light.  Neck:     Thyroid: No thyroid mass or thyromegaly.     Vascular: No carotid bruit.     Trachea: Trachea normal.  Cardiovascular:     Rate and Rhythm: Normal rate and regular rhythm.     Pulses: Normal pulses.     Heart sounds: Normal heart sounds, S1 normal and S2 normal. No murmur heard.  No friction rub. No gallop.   Pulmonary:     Effort: Pulmonary effort is normal. No tachypnea or respiratory distress.     Breath sounds: Normal breath sounds. No decreased breath sounds, wheezing, rhonchi or rales.  Abdominal:     General: Bowel sounds are normal.     Palpations: Abdomen is soft.     Tenderness: There is no abdominal tenderness.  Musculoskeletal:     Cervical back: Normal range of motion and neck supple.  Skin:    General:  Skin is warm and dry.     Findings: No rash.  Neurological:     Mental Status: She is alert.  Psychiatric:        Mood and Affect: Mood is not anxious or depressed.        Speech: Speech normal.        Behavior: Behavior normal. Behavior is cooperative.        Thought Content: Thought content normal.        Judgment: Judgment normal.      Assessment and Plan   Acute cystitis without hematuria Concentrated urine.. push fluids.  Send for culture but given typical symptoms and UA mildly positive in pt with history of frequent UTI... will treat with  Bactrim x 3 days.     Eliezer Lofts, MD

## 2019-11-23 LAB — URINE CULTURE
MICRO NUMBER:: 10737515
SPECIMEN QUALITY:: ADEQUATE

## 2019-12-10 DIAGNOSIS — D649 Anemia, unspecified: Secondary | ICD-10-CM | POA: Diagnosis not present

## 2019-12-10 DIAGNOSIS — J45909 Unspecified asthma, uncomplicated: Secondary | ICD-10-CM | POA: Diagnosis not present

## 2019-12-10 DIAGNOSIS — M069 Rheumatoid arthritis, unspecified: Secondary | ICD-10-CM | POA: Diagnosis not present

## 2019-12-10 DIAGNOSIS — M792 Neuralgia and neuritis, unspecified: Secondary | ICD-10-CM | POA: Diagnosis not present

## 2019-12-10 DIAGNOSIS — H409 Unspecified glaucoma: Secondary | ICD-10-CM | POA: Diagnosis not present

## 2019-12-10 DIAGNOSIS — E119 Type 2 diabetes mellitus without complications: Secondary | ICD-10-CM | POA: Diagnosis not present

## 2019-12-10 DIAGNOSIS — E039 Hypothyroidism, unspecified: Secondary | ICD-10-CM | POA: Diagnosis not present

## 2019-12-10 DIAGNOSIS — I10 Essential (primary) hypertension: Secondary | ICD-10-CM | POA: Diagnosis not present

## 2019-12-10 DIAGNOSIS — M47816 Spondylosis without myelopathy or radiculopathy, lumbar region: Secondary | ICD-10-CM | POA: Diagnosis not present

## 2019-12-13 ENCOUNTER — Ambulatory Visit (INDEPENDENT_AMBULATORY_CARE_PROVIDER_SITE_OTHER): Payer: Medicare HMO | Admitting: Neurology

## 2019-12-13 ENCOUNTER — Other Ambulatory Visit: Payer: Self-pay

## 2019-12-13 DIAGNOSIS — H401133 Primary open-angle glaucoma, bilateral, severe stage: Secondary | ICD-10-CM | POA: Diagnosis not present

## 2019-12-13 DIAGNOSIS — G43709 Chronic migraine without aura, not intractable, without status migrainosus: Secondary | ICD-10-CM | POA: Diagnosis not present

## 2019-12-13 MED ORDER — ONABOTULINUMTOXINA 100 UNITS IJ SOLR
155.0000 [IU] | Freq: Once | INTRAMUSCULAR | Status: AC
  Administered 2019-12-13: 155 [IU] via INTRAMUSCULAR

## 2019-12-13 NOTE — Progress Notes (Signed)
[facility]   Procedure Note Botox  Attending: Dr. Metta Clines  Preoperative Diagnosis(es): Chronic migraine  Consent obtained from: Patient Benefits discussed included, but were not limited to decreased muscle tightness, increased joint range of motion, and decreased pain.  Risk discussed included, but were not limited pain and discomfort, bleeding, bruising, excessive weakness, venous thrombosis, muscle atrophy and dysphagia.  Anticipated outcomes of the procedure as well as he risks and benefits of the alternatives to the procedure, and the roles and tasks of the personnel to be involved, were discussed with the patient, and the patient consents to the procedure and agrees to proceed. A copy of the patient medication guide was given to the patient which explains the blackbox warning.  Patients identity and treatment sites confirmed:  Yes.  Details of Procedure: Skin was cleaned with alcohol. Prior to injection, the needle plunger was aspirated to make sure the needle was not within a blood vessel.  There was no blood retrieved on aspiration.    Following is a summary of the muscles injected  And the amount of Botulinum toxin used:  Dilution 200 units of Botox was reconstituted with 4 ml of preservative free normal saline. Time of reconstitution: At the time of the office visit (<30 minutes prior to injection)   Injections  155 total units of Botox was injected with a 30 gauge needle.  Injection Sites: L occipitalis: 15 units- 3 sites  R occiptalis: 15 units- 3 sites  L upper trapezius: 15 units- 3 sites R upper trapezius: 15 units- 3 sits          L paraspinal: 10 units- 2 sites R paraspinal: 10 units- 2 sites  Face L frontalis(2 injection sites):10 units   R frontalis(2 injection sites):10 units         L corrugator: 5 units   R corrugator: 5 units           Procerus: 5 units   L temporalis: 20 units R temporalis: 20 units   Agent:  200 units of botulinum Type A  (Onobotulinum Toxin type A) was reconstituted with 4 ml of preservative free normal saline.  Time of reconstitution: At the time of the office visit (<30 minutes prior to injection)     Total injected (Units): 155  Total wasted (Units): none wasted  Patient tolerated procedure well without complications.   Reinjection is anticipated in 3 months.

## 2019-12-19 DIAGNOSIS — E039 Hypothyroidism, unspecified: Secondary | ICD-10-CM | POA: Diagnosis not present

## 2019-12-19 DIAGNOSIS — H409 Unspecified glaucoma: Secondary | ICD-10-CM | POA: Diagnosis not present

## 2019-12-19 DIAGNOSIS — D649 Anemia, unspecified: Secondary | ICD-10-CM | POA: Diagnosis not present

## 2019-12-19 DIAGNOSIS — M47816 Spondylosis without myelopathy or radiculopathy, lumbar region: Secondary | ICD-10-CM | POA: Diagnosis not present

## 2019-12-19 DIAGNOSIS — M792 Neuralgia and neuritis, unspecified: Secondary | ICD-10-CM | POA: Diagnosis not present

## 2019-12-19 DIAGNOSIS — J45909 Unspecified asthma, uncomplicated: Secondary | ICD-10-CM | POA: Diagnosis not present

## 2019-12-19 DIAGNOSIS — E119 Type 2 diabetes mellitus without complications: Secondary | ICD-10-CM | POA: Diagnosis not present

## 2019-12-19 DIAGNOSIS — I10 Essential (primary) hypertension: Secondary | ICD-10-CM | POA: Diagnosis not present

## 2019-12-19 DIAGNOSIS — M069 Rheumatoid arthritis, unspecified: Secondary | ICD-10-CM | POA: Diagnosis not present

## 2020-01-20 ENCOUNTER — Emergency Department
Admission: EM | Admit: 2020-01-20 | Discharge: 2020-01-20 | Disposition: A | Discharge status: Home / Self Care | Payer: Medicare HMO | Attending: Emergency Medicine | Admitting: Emergency Medicine

## 2020-01-20 ENCOUNTER — Emergency Department: Payer: Medicare HMO

## 2020-01-20 ENCOUNTER — Other Ambulatory Visit: Payer: Self-pay

## 2020-01-20 DIAGNOSIS — I10 Essential (primary) hypertension: Secondary | ICD-10-CM | POA: Diagnosis not present

## 2020-01-20 DIAGNOSIS — E1140 Type 2 diabetes mellitus with diabetic neuropathy, unspecified: Secondary | ICD-10-CM | POA: Diagnosis not present

## 2020-01-20 DIAGNOSIS — E039 Hypothyroidism, unspecified: Secondary | ICD-10-CM | POA: Insufficient documentation

## 2020-01-20 DIAGNOSIS — Z79899 Other long term (current) drug therapy: Secondary | ICD-10-CM | POA: Diagnosis not present

## 2020-01-20 DIAGNOSIS — C439 Malignant melanoma of skin, unspecified: Secondary | ICD-10-CM | POA: Diagnosis not present

## 2020-01-20 DIAGNOSIS — R509 Fever, unspecified: Secondary | ICD-10-CM | POA: Diagnosis not present

## 2020-01-20 DIAGNOSIS — C50411 Malignant neoplasm of upper-outer quadrant of right female breast: Secondary | ICD-10-CM | POA: Diagnosis not present

## 2020-01-20 DIAGNOSIS — Z7984 Long term (current) use of oral hypoglycemic drugs: Secondary | ICD-10-CM | POA: Diagnosis not present

## 2020-01-20 DIAGNOSIS — R531 Weakness: Secondary | ICD-10-CM | POA: Diagnosis not present

## 2020-01-20 DIAGNOSIS — N390 Urinary tract infection, site not specified: Secondary | ICD-10-CM

## 2020-01-20 DIAGNOSIS — Z7982 Long term (current) use of aspirin: Secondary | ICD-10-CM | POA: Insufficient documentation

## 2020-01-20 DIAGNOSIS — R05 Cough: Secondary | ICD-10-CM | POA: Diagnosis not present

## 2020-01-20 LAB — URINALYSIS, COMPLETE (UACMP) WITH MICROSCOPIC
Bilirubin Urine: NEGATIVE
Glucose, UA: NEGATIVE mg/dL
Ketones, ur: NEGATIVE mg/dL
Nitrite: NEGATIVE
Protein, ur: 30 mg/dL — AB
Specific Gravity, Urine: 1.01 (ref 1.005–1.030)
pH: 6 (ref 5.0–8.0)

## 2020-01-20 LAB — CBC
HCT: 36.6 % (ref 36.0–46.0)
Hemoglobin: 11.6 g/dL — ABNORMAL LOW (ref 12.0–15.0)
MCH: 27.8 pg (ref 26.0–34.0)
MCHC: 31.7 g/dL (ref 30.0–36.0)
MCV: 87.8 fL (ref 80.0–100.0)
Platelets: 402 10*3/uL — ABNORMAL HIGH (ref 150–400)
RBC: 4.17 MIL/uL (ref 3.87–5.11)
RDW: 14.9 % (ref 11.5–15.5)
WBC: 19 10*3/uL — ABNORMAL HIGH (ref 4.0–10.5)
nRBC: 0 % (ref 0.0–0.2)

## 2020-01-20 LAB — COMPREHENSIVE METABOLIC PANEL
ALT: 14 U/L (ref 0–44)
AST: 22 U/L (ref 15–41)
Albumin: 3.5 g/dL (ref 3.5–5.0)
Alkaline Phosphatase: 88 U/L (ref 38–126)
Anion gap: 13 (ref 5–15)
BUN: 12 mg/dL (ref 8–23)
CO2: 27 mmol/L (ref 22–32)
Calcium: 8.9 mg/dL (ref 8.9–10.3)
Chloride: 95 mmol/L — ABNORMAL LOW (ref 98–111)
Creatinine, Ser: 0.96 mg/dL (ref 0.44–1.00)
GFR calc Af Amer: >60 mL/min (ref >60)
GFR calc non Af Amer: 55 mL/min — ABNORMAL LOW (ref >60)
Glucose, Bld: 119 mg/dL — ABNORMAL HIGH (ref 70–99)
Potassium: 2.9 mmol/L — ABNORMAL LOW (ref 3.5–5.1)
Sodium: 135 mmol/L (ref 135–145)
Total Bilirubin: 0.7 mg/dL (ref 0.3–1.2)
Total Protein: 7.8 g/dL (ref 6.5–8.1)

## 2020-01-20 MED ORDER — SODIUM CHLORIDE 0.9 % IV SOLN
1.0000 g | Freq: Once | INTRAVENOUS | Status: AC
  Administered 2020-01-20: 1 g via INTRAVENOUS
  Filled 2020-01-20: qty 10

## 2020-01-20 MED ORDER — POTASSIUM CHLORIDE CRYS ER 20 MEQ PO TBCR
40.0000 meq | EXTENDED_RELEASE_TABLET | Freq: Once | ORAL | Status: AC
  Administered 2020-01-20: 40 meq via ORAL
  Filled 2020-01-20: qty 2

## 2020-01-20 MED ORDER — CEFDINIR 300 MG PO CAPS: 300.0000 mg | ORAL_CAPSULE | Freq: Two times a day (BID) | ORAL | 0 refills | Status: AC

## 2020-01-20 NOTE — ED Notes (Signed)
Assumed care of pt upon being roomed, heavy two assist from recliner to litter. Denies pain at this time put reports weakness in bilateral legs. Husband at bedside. Pt trial-ed on RA and de-sat to 88%, placed back on 2L [place] with improvement to 93-94%. XR obtained. Awaiting second lactic draw for fluid administration orders and completion.

## 2020-01-20 NOTE — ED Notes (Signed)
DC reviewed by provider and RN. Merry Proud picked pt up at ED entrance. AO x4. Talking in full sentences with regular and unlabored breathing.

## 2020-01-20 NOTE — ED Notes (Signed)
Abx infusing, pt called family for a ride home at dc

## 2020-01-20 NOTE — ED Notes (Signed)
Assumed care of pt upon being roomed, denies pain or sob at this time. AOx4. Pt attempted to use bedpan to provide urine sample but unable, pt provided with water and explained importance of need for UA. Family updated on plan of care at this point. Pt side rails up x2, call bell within reach, talking with regular and unlabored breathing.

## 2020-01-20 NOTE — ED Triage Notes (Signed)
Pt here after fell off toilet today.  Reports fevers this week up to 102.  Unlabored. No fever at this time. Has felt week and just "not well" this week.  Color WNL.  VSS currently.

## 2020-01-20 NOTE — ED Provider Notes (Signed)
[facility] Emergency Department Provider Note   ____________________________________________   First MD Initiated Contact with Patient 01/20/20 2015     (approximate)  I have reviewed the triage vital signs and the nursing notes.   HISTORY  Chief Complaint Weakness    HPI Shannon Higgins is a 85 y.o. female with past medical history of diabetes, reflux, hypertension, and transplanted kidney who presents after a fall off the toilet earlier today.  Patient states that this is a normal occurrence for her as she has "balance issues". Patient states that she has had increasing weakness over the last week with associated fevers to 102F.  Patient only endorses generalized symptoms of "not feeling well" this last week.  Patient denies any cough, runny nose, sore throat, congestion, shortness of breath, nausea/vomiting/diarrhea, abdominal pain, dysuria, or weakness/numbness/paresthesias in any extremity         Past Medical History:  Diagnosis Date  . Acute tubular injury of transplanted kidney ([place]) 06/14/2016  . AKI (acute kidney injury) ([place]) 06/14/2016  . Asthma   . Breast cancer ([place])   . CAP (community acquired pneumonia) 04/29/2012  . Chronic sinusitis   . Confusion 12/06/2016  . Diabetes mellitus without complication ([place])   . GERD (gastroesophageal reflux disease)   . History of ankle fracture 05/14/2018   Last Assessment & Plan:  With a recent fall. Right medial malleous. Has ortho follow up soon, splinted now and tylenol helps her pain  . History of recurrent UTIs   . Hypertension   . Hypokalemia 04/26/2012  . Hyponatremia   . Insomnia   . Migraine   . Neuropathic pain   . Pneumonia   . Rheumatoid arthritis ([place])   . Sepsis ([place]) 04/26/2012  . Sleep apnea   . Stroke ([place])   . Thyroid disease     Patient Active Problem List   Diagnosis Date Noted  . Acute cystitis without hematuria 11/21/2019  . Imbalance 09/02/2019  . Preventative  health care 03/04/2019  . Insomnia 09/27/2018  . History of stroke 09/13/2018  . Recurrent UTI 06/20/2018  . Frequent falls 05/14/2018  . Gout attack 05/14/2018  . Closed fracture of medial malleolus 05/11/2018  . Type 2 diabetes mellitus with peripheral neuropathy ([place]) 05/11/2018  . Type 2 diabetes mellitus ([place]) 05/11/2018  . Orthostatic hypotension 05/08/2018  . Urinary tract infectious disease   . Malignant neoplasm of upper-outer quadrant of female breast ([place]) 10/17/2017  . Major depressive disorder 06/21/2017  . Chronic low back pain 01/13/2017  . Hyperlipidemia 12/06/2016  . Glaucoma 12/06/2016  . Neuropathic pain 12/06/2016  . Malignant melanoma of skin ([place]) 12/06/2016  . Dementia arising in the senium and presenium ([place]) 12/06/2016  . Chronic headache disorder 12/06/2016  . Transient hypotension 06/14/2016  . Hypothyroidism 06/14/2016  . Anemia 04/26/2012  . Hypertensive disorder 04/26/2012  . Gastroesophageal reflux disease 04/26/2012  . Obstructive sleep apnea syndrome 04/26/2012    Past Surgical History:  Procedure Laterality Date  . ABDOMINAL HYSTERECTOMY    . BACK SURGERY    . BREAST BIOPSY Right   . BREAST LUMPECTOMY Left 2015  . CHOLECYSTECTOMY    . KNEE SURGERY    . SHOULDER SURGERY      Prior to Admission medications   Medication Sig Start Date End Date Taking? Authorizing Provider  Accu-Chek FastClix Lancets MISC Use as instructed to test blood sugar daily 04/23/19   Pleas Koch, NP  ACCU-CHEK GUIDE test strip Use as instructed  to blood sugar daily 04/23/19   Pleas Koch, NP  albuterol (VENTOLIN HFA) 108 (90 Base) MCG/ACT inhaler Inhale 2 puffs into the lungs every 4 (four) hours as needed for shortness of breath. 08/27/18   Pleas Koch, NP  aspirin 325 MG tablet Take 1 tablet (325 mg total) by mouth daily. 09/15/18   Fritzi Mandes, MD  benzonatate (TESSALON PERLES) 100 MG capsule Take 1 capsule (100 mg total) by mouth 3 (three)  times daily as needed for cough. 08/06/19   [name], MD  blood glucose meter kit and supplies Dispense based on patient and insurance preference. Use up to 2 times daily as directed. (FOR ICD-10 E10.9, E11.9). 03/04/19   Pleas Koch, NP  cefdinir (OMNICEF) 300 MG capsule Take 1 capsule (300 mg total) by mouth 2 (two) times daily for 5 days. 01/20/20 01/25/20  Naaman Plummer, MD  cetirizine (ZYRTEC) 10 MG chewable tablet Chew 1 tablet by mouth once daily as needed for allergies. 12/26/17   Pleas Koch, NP  donepezil (ARICEPT) 10 MG tablet Take 1 tablet daily 05/15/19   Cameron Sprang, MD  dorzolamide-timolol (COSOPT) 22.3-6.8 MG/ML ophthalmic solution Place 1 drop into both eyes 2 (two) times daily.    [provider]  DULoxetine (CYMBALTA) 60 MG capsule TAKE 1 CAPSULE EVERY DAY  FOR  DEPRESSION 03/21/19   Pleas Koch, NP  fluticasone [facility]) 50 MCG/ACT nasal spray Place 1 spray into both nostrils daily. 10/30/19   Pleas Koch, NP  gabapentin (NEURONTIN) 800 MG tablet TAKE 1 TABLET BY MOUTH THREE TIMES A DAY 08/30/19   Pleas Koch, NP  latanoprost (XALATAN) 0.005 % ophthalmic solution Place 1 drop into both eyes at bedtime.    [provider]  levothyroxine (SYNTHROID) 112 MCG tablet Take 1 tablet by mouth every morning on an empty stomach with water only.  No food or other medications for 30 minutes. 04/12/19   Pleas Koch, NP  meclizine (ANTIVERT) 25 MG tablet Take 1 tablet (25 mg total) by mouth 2 (two) times daily as needed for dizziness. 09/02/19   Pleas Koch, NP  meloxicam (MOBIC) 15 MG tablet Take 1 tablet (15 mg total) by mouth daily as needed for pain. 10/30/19   Pleas Koch, NP  metFORMIN (GLUCOPHAGE) 500 MG tablet TAKE 1 TABLET BY MOUTH TWICE DAILY WITH FOOD FOR DIABETES. 01/01/19   Pleas Koch, NP  Netarsudil Dimesylate (RHOPRESSA) 0.02 % SOLN INSTILL 1 DROP IN BOTH EYES EVERY DAY 12/04/18   [provider]  omeprazole (PRILOSEC) 20 MG capsule TAKE 1 CAPSULE BY MOUTH TWICE DAILY FOR HEARTBURN. 10/30/19   Pleas Koch, NP  pilocarpine (PILOCAR) 2 % ophthalmic solution Place 1 drop into both eyes 4 (four) times daily.  02/19/19   [provider]  propranolol (INDERAL) 80 MG tablet TAKE 1 TABLET BY MOUTH ONCE DAILY FOR BLOOD PRESSURE. 03/21/19   Pleas Koch, NP  Rimegepant Sulfate (NURTEC) 75 MG TBDP Take 1 tablet by mouth as needed. Take 1 tablet as needed for migraine. Do not take more than 1 in 24 hours. 05/15/19   Cameron Sprang, MD  rosuvastatin (CRESTOR) 10 MG tablet TAKE 1 TABLET BY MOUTH ONCE DAILY FOR CHOLESTEROL. 01/01/19   Pleas Koch, NP  sulfamethoxazole-trimethoprim (BACTRIM DS) 800-160 MG tablet Take 1 tablet by mouth 2 (two) times daily. 11/21/19   Bedsole, Amy E, MD  SUMAtriptan (IMITREX) 25 MG tablet  Take as needed for migraine. Do not take more than twice a week 10/01/18   Cameron Sprang, MD  traZODone (DESYREL) 100 MG tablet TAKE 2 TABLETS AT BEDTIME AS NEEDED FOR SLEEP 08/06/19   Pleas Koch, NP    Allergies Patient has no known allergies.  Family History  Problem Relation Age of Onset  . Diabetes Daughter   . Hypertension Daughter   . Fibromyalgia Daughter   . GER disease Daughter   . Fibromyalgia Daughter   . Crohn's disease Daughter   . Asthma Daughter   . Hypertension Mother     Social History Social History   Tobacco Use  . Smoking status: Never Smoker  . Smokeless tobacco: Never Used  Vaping Use  . Vaping Use: Never used  Substance Use Topics  . Alcohol use: No  . Drug use: No    Review of Systems Constitutional: No fever/chills Eyes: No visual changes. ENT: No sore throat. Cardiovascular: Denies chest pain. Respiratory: Denies shortness of breath. Gastrointestinal: No abdominal pain.  No nausea, no vomiting.  No diarrhea. Genitourinary: Negative for dysuria. Musculoskeletal: Negative for acute arthralgias Skin:  Negative for rash. Neurological: Negative for headaches, numbness/paresthesias in any extremity Psychiatric: Negative for suicidal ideation/homicidal ideation   ____________________________________________   PHYSICAL EXAM:  VITAL SIGNS: ED Triage Vitals  Enc Vitals Group     BP 01/20/20 1511 135/70     Pulse Rate 01/20/20 1511 65     Resp 01/20/20 1511 16     Temp 01/20/20 1511 98.6 F (37 C)     Temp Source 01/20/20 1511 Oral     SpO2 01/20/20 1511 97 %     Weight 01/20/20 1509 155 lb (70.3 kg)     Height 01/20/20 1509 5' 4"  (1.626 m)     Head Circumference --      Peak Flow --      Pain Score 01/20/20 1509 0     Pain Loc --      Pain Edu? --      Excl. in [place]? --     Constitutional: Alert and oriented. Well appearing and in no acute distress. Eyes: Conjunctivae are normal. PERRL. EOMI. Head: Atraumatic. Nose: No congestion/rhinnorhea. Mouth/Throat: Mucous membranes are moist. Neck: No stridor Cardiovascular: Normal rate, regular rhythm. Grossly normal heart sounds.  Good peripheral circulation. Respiratory: Normal respiratory effort.  No retractions. Gastrointestinal: Soft and nontender. No distention. Musculoskeletal: No lower extremity tenderness nor edema.  No joint effusions. Neurologic:  Normal speech and language. No gross focal neurologic deficits are appreciated. Skin:  Skin is warm and dry. No rash noted. Psychiatric: Mood and affect are normal. Speech and behavior are normal.  ____________________________________________   LABS (all labs ordered are listed, but only abnormal results are displayed)  Labs Reviewed  CBC - Abnormal; Notable for the following components:      Result Value   WBC 19.0 (*)    Hemoglobin 11.6 (*)    Platelets 402 (*)    All other components within normal limits  URINALYSIS, COMPLETE (UACMP) WITH MICROSCOPIC - Abnormal; Notable for the following components:   Color, Urine YELLOW (*)    APPearance HAZY (*)    Hgb urine  dipstick SMALL (*)    Protein, ur 30 (*)    Leukocytes,Ua LARGE (*)    Bacteria, UA MANY (*)    All other components within normal limits  COMPREHENSIVE METABOLIC PANEL - Abnormal; Notable for the following components:   Potassium 2.9 (*)  Chloride 95 (*)    Glucose, Bld 119 (*)    GFR calc non Af Amer 55 (*)    All other components within normal limits   ____________________________________________  EKG  ED ECG REPORT I, Naaman Plummer, the attending physician, personally viewed and interpreted this ECG.  Date: 01/20/2020 EKG Time: 1512 Rate: 68 Rhythm: normal sinus rhythm QRS Axis: normal Intervals: normal ST/T Wave abnormalities: normal Narrative Interpretation: no evidence of acute ischemia  ____________________________________________  RADIOLOGY  ED MD interpretation: 2 view chest x-ray shows no acute pneumonia, pneumothorax, or widened mediastinum.  Official radiology report(s): DG Chest 2 View  Result Date: 01/20/2020 CLINICAL DATA:  Cough, fever, fell EXAM: CHEST - 2 VIEW COMPARISON:  09/13/2018 FINDINGS: Frontal and lateral views of the chest demonstrate an unremarkable cardiac silhouette. No airspace disease, effusion, or pneumothorax. No acute bony abnormalities. Stable bilateral shoulder arthroplasties. IMPRESSION: 1. No acute intrathoracic process. Electronically Signed   By: Randa Ngo M.D.   On: 01/20/2020 16:20    ____________________________________________   PROCEDURES  Procedure(s) performed (including Critical Care):  Procedures   ____________________________________________   INITIAL IMPRESSION / ASSESSMENT AND PLAN / ED COURSE        Patient presents for generalized weakness and a fall turning up off the toilet just prior to arrival.  Patient has no external injuries and does not complain of any pain at this time.  Patient does have a leukocytosis to 19.  Differential diagnosis includes UTI, pneumonia, upper respiratory infection,  sinusitis, or Covid  Laboratory and radiologic evaluation only show evidence of urinalysis with leukocytosis and many bacteria.  Will treat for presumed UTI in the setting of worsening weakness and fevers.  Patient was encouraged to follow-up with her primary care physician in the next 24-48 hours for recheck of the symptoms and to return if the symptoms worsen at all.      ____________________________________________   FINAL CLINICAL IMPRESSION(S) / ED DIAGNOSES  Final diagnoses:  Weakness  Urinary tract infection without hematuria, site unspecified     ED Discharge Orders         Ordered    cefdinir (OMNICEF) 300 MG capsule  2 times daily        01/20/20 2234           Note:  This document was prepared using Dragon voice recognition software and may include unintentional dictation errors.   Naaman Plummer, MD 01/20/20 [phone]

## 2020-01-20 NOTE — ED Notes (Signed)
UA obtained via straight cath, awaiting results. Pt resting with eyes closed in room, awakens to RN entering, pt spoke to daughter on her cell phone

## 2020-02-05 NOTE — Telephone Encounter (Signed)
Alice, FYI 

## 2020-02-05 NOTE — Telephone Encounter (Signed)
See other my chart message and documentation.

## 2020-02-05 NOTE — Telephone Encounter (Signed)
Shannon Higgins, will you see if Dr. Lorelei Pont can see this patient virtually with specimen drop off? UTI symptoms.

## 2020-02-06 ENCOUNTER — Telehealth (INDEPENDENT_AMBULATORY_CARE_PROVIDER_SITE_OTHER): Payer: Medicare HMO | Admitting: Family Medicine

## 2020-02-06 ENCOUNTER — Encounter: Payer: Self-pay | Admitting: Family Medicine

## 2020-02-06 ENCOUNTER — Encounter: Payer: Self-pay | Admitting: Neurology

## 2020-02-06 VITALS — Ht 64.0 in

## 2020-02-06 DIAGNOSIS — N3001 Acute cystitis with hematuria: Secondary | ICD-10-CM | POA: Diagnosis not present

## 2020-02-06 DIAGNOSIS — R8290 Unspecified abnormal findings in urine: Secondary | ICD-10-CM

## 2020-02-06 LAB — POC URINALSYSI DIPSTICK (AUTOMATED)
Bilirubin, UA: NEGATIVE
Glucose, UA: NEGATIVE
Ketones, UA: NEGATIVE
Nitrite, UA: NEGATIVE
Protein, UA: POSITIVE — AB
Spec Grav, UA: 1.01 (ref 1.010–1.025)
Urobilinogen, UA: 0.2 E.U./dL
pH, UA: 6 (ref 5.0–8.0)

## 2020-02-06 MED ORDER — SULFAMETHOXAZOLE-TRIMETHOPRIM 800-160 MG PO TABS: 1.0000 | ORAL_TABLET | Freq: Two times a day (BID) | ORAL | 0 refills | Status: AC

## 2020-02-06 NOTE — Progress Notes (Signed)
Inocencia Murtaugh T. Leeza Heiner, MD Primary Care and Sports Medicine [facility] [place], [address] Phone: [phone]  FAX: [phone]  Shannon Higgins - 85 y.o. female  MRN [id number]  Date of Birth: [birth year]  Visit Date: 02/06/2020  PCP: Pleas Koch, NP  Referred by: Pleas Koch, NP Chief Complaint  Patient presents with  . Vaginal Pressure    Seen ED on 01/20/2020 for UTI.  Does not feel it completely went away.  . Abnormal Odor to Urine   Virtual Visit via Video Note:  I connected with  Shannon Higgins on 02/06/2020 11:20 AM EDT by a video enabled telemedicine application and verified that I am speaking with the correct person using two identifiers.   Location patient: home computer, tablet, or smartphone Location provider: work or home office Consent: Verbal consent directly obtained from [employer]. Persons participating in the virtual visit: patient, provider  I discussed the limitations of evaluation and management by telemedicine and the availability of in person appointments. The patient expressed understanding and agreed to proceed.  History of Present Illness:  She was seen in the emergency room and on January 20, 2020 and she did have a urinalysis that was indicative of probable UTI and she was placed on cephalosporin for 5 days.  She also got a gram of Rocephin.  She continues to have some fullness in the suprapubic region.  She does not have any dysuria.  There was no urine culture done at that time.  She otherwise denies any symptoms including no cough, sore throat, congestion, earache, loss of taste or smell, and no GI symptoms.  She currently does not have a fever.  Review of Systems as above: See pertinent positives and pertinent negatives per HPI No acute distress verbally   Observations/Objective/Exam:  An attempt was made to discern vital signs over the phone and per patient if  applicable and possible.   General:    Alert, Oriented, appears well and in no acute distress  Pulmonary:     On inspection no signs of respiratory distress.  Psych / Neurological:     Pleasant and cooperative.  Results for orders placed or performed in visit on 02/06/20  POCT Urinalysis Dipstick (Automated)  Result Value Ref Range   Color, UA Yellow    Clarity, UA Clear    Glucose, UA Negative Negative   Bilirubin, UA Negative    Ketones, UA Negative    Spec Grav, UA 1.010 1.010 - 1.025   Blood, UA Trace    pH, UA 6.0 5.0 - 8.0   Protein, UA Positive (A) Negative   Urobilinogen, UA 0.2 0.2 or 1.0 E.U./dL   Nitrite, UA Negative    Leukocytes, UA Large (3+) (A) Negative     Assessment and Plan:    ICD-10-CM   1. Acute cystitis with hematuria  N30.01   2. Abnormal urine odor  R82.90 POCT Urinalysis Dipstick (Automated)    Urine Culture   Presumptive UTI, treat as such.  I am going to treat her for 10 days given probable treatment failure with cephalosporin.  Urine culture to assess for susceptibility.  I discussed the assessment and treatment plan with the patient. The patient was provided an opportunity to ask questions and all were answered. The patient agreed with the plan and demonstrated an understanding of the instructions.   The patient was advised to call back or seek an in-person evaluation  if the symptoms worsen or if the condition fails to improve as anticipated.  Follow-up: prn unless noted otherwise below No follow-ups on file.  Meds ordered this encounter  Medications  . sulfamethoxazole-trimethoprim (BACTRIM DS) 800-160 MG tablet    Sig: Take 1 tablet by mouth 2 (two) times daily for 10 days.    Dispense:  20 tablet    Refill:  0   Orders Placed This Encounter  Procedures  . Urine Culture  . POCT Urinalysis Dipstick (Automated)    Signed,  Zyon Rosser T. Kinsler Soeder, MD

## 2020-02-06 NOTE — Progress Notes (Addendum)
Patient has PA on file valid until 05/01/21. Need to change to [facility] [phone]). Called and set up her account and gave verbal script. Awaiting consent so we can sched delivery.

## 2020-02-06 NOTE — Telephone Encounter (Signed)
Viewed schedule. Pt already scheduled and seen by Dr.Copland in regards to this 10/7.

## 2020-02-06 NOTE — Telephone Encounter (Signed)
Called patient on home and mobile. No answer on either. Did leave voicemail to call back and schedule.

## 2020-02-08 LAB — URINE CULTURE
MICRO NUMBER:: 11044503
SPECIMEN QUALITY:: ADEQUATE

## 2020-03-13 ENCOUNTER — Other Ambulatory Visit: Payer: Self-pay

## 2020-03-13 ENCOUNTER — Ambulatory Visit (INDEPENDENT_AMBULATORY_CARE_PROVIDER_SITE_OTHER): Payer: Medicare HMO | Admitting: Neurology

## 2020-03-13 DIAGNOSIS — G43709 Chronic migraine without aura, not intractable, without status migrainosus: Secondary | ICD-10-CM

## 2020-03-13 DIAGNOSIS — H401133 Primary open-angle glaucoma, bilateral, severe stage: Secondary | ICD-10-CM | POA: Diagnosis not present

## 2020-03-13 MED ORDER — ONABOTULINUMTOXINA 100 UNITS IJ SOLR
200.0000 [IU] | Freq: Once | INTRAMUSCULAR | Status: AC
  Administered 2020-03-13: 155 [IU] via INTRAMUSCULAR

## 2020-03-13 NOTE — Progress Notes (Signed)
[facility]  ° °Procedure Note Botox ° °Attending: Dr. Eupha Lobb ° °Preoperative Diagnosis(es): Chronic migraine ° °Consent obtained from: The patient °Benefits discussed included, but were not limited to decreased muscle tightness, increased joint range of motion, and decreased pain.  Risk discussed included, but were not limited pain and discomfort, bleeding, bruising, excessive weakness, venous thrombosis, muscle atrophy and dysphagia.  Anticipated outcomes of the procedure as well as he risks and benefits of the alternatives to the procedure, and the roles and tasks of the personnel to be involved, were discussed with the patient, and the patient consents to the procedure and agrees to proceed. A copy of the patient medication guide was given to the patient which explains the blackbox warning. ° °Patients identity and treatment sites confirmed Yes.  . ° °Details of Procedure: °Skin was cleaned with alcohol. Prior to injection, the needle plunger was aspirated to make sure the needle was not within a blood vessel.  There was no blood retrieved on aspiration.   ° °Following is a summary of the muscles injected  And the amount of Botulinum toxin used: ° °Dilution °200 units of Botox was reconstituted with 4 ml of preservative free normal saline. °Time of reconstitution: At the time of the office visit (<30 minutes prior to injection)  ° °Injections  °155 total units of Botox was injected with a 30 gauge needle. ° °Injection Sites: °L occipitalis: 15 units- 3 sites  °R occiptalis: 15 units- 3 sites ° °L upper trapezius: 15 units- 3 sites °R upper trapezius: 15 units- 3 sits          °L paraspinal: 10 units- 2 sites °R paraspinal: 10 units- 2 sites ° °Face °L frontalis(2 injection sites):10 units   °R frontalis(2 injection sites):10 units         °L corrugator: 5 units   °R corrugator: 5 units           °Procerus: 5 units   °L temporalis: 20 units °R temporalis: 20 units  ° °Agent:  °200 units of botulinum Type  A (Onobotulinum Toxin type A) was reconstituted with 4 ml of preservative free normal saline.  °Time of reconstitution: At the time of the office visit (<30 minutes prior to injection)  ° ° ° Total injected (Units):  155 ° Total wasted (Units):  45 ° °Patient tolerated procedure well without complications.   °Reinjection is anticipated in 3 months. ° ° °

## 2020-03-30 ENCOUNTER — Other Ambulatory Visit: Payer: Self-pay

## 2020-03-30 DIAGNOSIS — G4700 Insomnia, unspecified: Secondary | ICD-10-CM

## 2020-03-30 MED ORDER — TRAZODONE HCL 100 MG PO TABS: ORAL_TABLET | ORAL | 0 refills | Status: DC

## 2020-04-22 ENCOUNTER — Other Ambulatory Visit: Payer: Self-pay

## 2020-04-22 DIAGNOSIS — E039 Hypothyroidism, unspecified: Secondary | ICD-10-CM | POA: Diagnosis not present

## 2020-04-22 DIAGNOSIS — N136 Pyonephrosis: Secondary | ICD-10-CM | POA: Diagnosis present

## 2020-04-22 DIAGNOSIS — Z7982 Long term (current) use of aspirin: Secondary | ICD-10-CM

## 2020-04-22 DIAGNOSIS — G9389 Other specified disorders of brain: Secondary | ICD-10-CM | POA: Diagnosis not present

## 2020-04-22 DIAGNOSIS — Z9049 Acquired absence of other specified parts of digestive tract: Secondary | ICD-10-CM

## 2020-04-22 DIAGNOSIS — G9341 Metabolic encephalopathy: Secondary | ICD-10-CM | POA: Diagnosis not present

## 2020-04-22 DIAGNOSIS — I6782 Cerebral ischemia: Secondary | ICD-10-CM | POA: Diagnosis not present

## 2020-04-22 DIAGNOSIS — R531 Weakness: Secondary | ICD-10-CM | POA: Diagnosis not present

## 2020-04-22 DIAGNOSIS — B9620 Unspecified Escherichia coli [E. coli] as the cause of diseases classified elsewhere: Secondary | ICD-10-CM | POA: Diagnosis present

## 2020-04-22 DIAGNOSIS — Z853 Personal history of malignant neoplasm of breast: Secondary | ICD-10-CM

## 2020-04-22 DIAGNOSIS — Z20822 Contact with and (suspected) exposure to covid-19: Secondary | ICD-10-CM | POA: Diagnosis not present

## 2020-04-22 DIAGNOSIS — I10 Essential (primary) hypertension: Secondary | ICD-10-CM | POA: Diagnosis not present

## 2020-04-22 DIAGNOSIS — B9689 Other specified bacterial agents as the cause of diseases classified elsewhere: Secondary | ICD-10-CM | POA: Diagnosis not present

## 2020-04-22 DIAGNOSIS — M069 Rheumatoid arthritis, unspecified: Secondary | ICD-10-CM | POA: Diagnosis present

## 2020-04-22 DIAGNOSIS — A419 Sepsis, unspecified organism: Principal | ICD-10-CM | POA: Diagnosis present

## 2020-04-22 DIAGNOSIS — F0390 Unspecified dementia without behavioral disturbance: Secondary | ICD-10-CM | POA: Diagnosis not present

## 2020-04-22 DIAGNOSIS — R410 Disorientation, unspecified: Secondary | ICD-10-CM | POA: Diagnosis not present

## 2020-04-22 DIAGNOSIS — Z7984 Long term (current) use of oral hypoglycemic drugs: Secondary | ICD-10-CM | POA: Diagnosis not present

## 2020-04-22 DIAGNOSIS — J45909 Unspecified asthma, uncomplicated: Secondary | ICD-10-CM | POA: Diagnosis present

## 2020-04-22 DIAGNOSIS — K449 Diaphragmatic hernia without obstruction or gangrene: Secondary | ICD-10-CM | POA: Diagnosis not present

## 2020-04-22 DIAGNOSIS — Z90710 Acquired absence of both cervix and uterus: Secondary | ICD-10-CM

## 2020-04-22 DIAGNOSIS — M109 Gout, unspecified: Secondary | ICD-10-CM | POA: Diagnosis present

## 2020-04-22 DIAGNOSIS — E1142 Type 2 diabetes mellitus with diabetic polyneuropathy: Secondary | ICD-10-CM | POA: Diagnosis present

## 2020-04-22 DIAGNOSIS — K219 Gastro-esophageal reflux disease without esophagitis: Secondary | ICD-10-CM | POA: Diagnosis present

## 2020-04-22 DIAGNOSIS — Z8673 Personal history of transient ischemic attack (TIA), and cerebral infarction without residual deficits: Secondary | ICD-10-CM | POA: Diagnosis not present

## 2020-04-22 DIAGNOSIS — Z79890 Hormone replacement therapy (postmenopausal): Secondary | ICD-10-CM | POA: Diagnosis not present

## 2020-04-22 DIAGNOSIS — I6389 Other cerebral infarction: Secondary | ICD-10-CM | POA: Diagnosis not present

## 2020-04-22 DIAGNOSIS — R5383 Other fatigue: Secondary | ICD-10-CM | POA: Diagnosis not present

## 2020-04-22 DIAGNOSIS — F05 Delirium due to known physiological condition: Secondary | ICD-10-CM | POA: Diagnosis not present

## 2020-04-22 DIAGNOSIS — J3489 Other specified disorders of nose and nasal sinuses: Secondary | ICD-10-CM | POA: Diagnosis not present

## 2020-04-22 DIAGNOSIS — J90 Pleural effusion, not elsewhere classified: Secondary | ICD-10-CM | POA: Diagnosis not present

## 2020-04-22 DIAGNOSIS — R509 Fever, unspecified: Secondary | ICD-10-CM | POA: Diagnosis not present

## 2020-04-22 DIAGNOSIS — N390 Urinary tract infection, site not specified: Secondary | ICD-10-CM | POA: Diagnosis not present

## 2020-04-22 DIAGNOSIS — R000 Tachycardia, unspecified: Secondary | ICD-10-CM | POA: Diagnosis not present

## 2020-04-22 LAB — BASIC METABOLIC PANEL
Anion gap: 10 (ref 5–15)
BUN: 15 mg/dL (ref 8–23)
CO2: 25 mmol/L (ref 22–32)
Calcium: 9.4 mg/dL (ref 8.9–10.3)
Chloride: 102 mmol/L (ref 98–111)
Creatinine, Ser: 0.88 mg/dL (ref 0.44–1.00)
GFR, Estimated: >60 mL/min (ref >60)
Glucose, Bld: 165 mg/dL — ABNORMAL HIGH (ref 70–99)
Potassium: 3.4 mmol/L — ABNORMAL LOW (ref 3.5–5.1)
Sodium: 137 mmol/L (ref 135–145)

## 2020-04-22 LAB — URINALYSIS, COMPLETE (UACMP) WITH MICROSCOPIC
Bilirubin Urine: NEGATIVE
Glucose, UA: NEGATIVE mg/dL
Ketones, ur: NEGATIVE mg/dL
Nitrite: NEGATIVE
Protein, ur: 100 mg/dL — AB
Specific Gravity, Urine: 1.011 (ref 1.005–1.030)
WBC, UA: >50 WBC/hpf — ABNORMAL HIGH (ref 0–5)
pH: 6 (ref 5.0–8.0)

## 2020-04-22 LAB — HEPATIC FUNCTION PANEL
ALT: 11 U/L (ref 0–44)
AST: 21 U/L (ref 15–41)
Albumin: 3.7 g/dL (ref 3.5–5.0)
Alkaline Phosphatase: 75 U/L (ref 38–126)
Bilirubin, Direct: <0.1 mg/dL (ref 0.0–0.2)
Total Bilirubin: 0.6 mg/dL (ref 0.3–1.2)
Total Protein: 7.6 g/dL (ref 6.5–8.1)

## 2020-04-22 LAB — CBC
HCT: 38.8 % (ref 36.0–46.0)
Hemoglobin: 12.5 g/dL (ref 12.0–15.0)
MCH: 28.2 pg (ref 26.0–34.0)
MCHC: 32.2 g/dL (ref 30.0–36.0)
MCV: 87.6 fL (ref 80.0–100.0)
Platelets: 282 10*3/uL (ref 150–400)
RBC: 4.43 MIL/uL (ref 3.87–5.11)
RDW: 14.6 % (ref 11.5–15.5)
WBC: 16.3 10*3/uL — ABNORMAL HIGH (ref 4.0–10.5)
nRBC: 0 % (ref 0.0–0.2)

## 2020-04-22 LAB — RESP PANEL BY RT-PCR (FLU A&B, COVID) ARPGX2
Influenza A by PCR: NEGATIVE
Influenza B by PCR: NEGATIVE
SARS Coronavirus 2 by RT PCR: NEGATIVE

## 2020-04-22 LAB — MAGNESIUM: Magnesium: 1.9 mg/dL (ref 1.7–2.4)

## 2020-04-22 LAB — LACTIC ACID, PLASMA: Lactic Acid, Venous: 1.6 mmol/L (ref 0.5–1.9)

## 2020-04-22 MED ORDER — ACETAMINOPHEN 325 MG PO TABS
650.0000 mg | ORAL_TABLET | Freq: Once | ORAL | Status: AC
  Administered 2020-04-22: 650 mg via ORAL
  Filled 2020-04-22: qty 2

## 2020-04-22 NOTE — ED Triage Notes (Signed)
Reports fever, fatigue and weakness X 2 weeks. Pt alert to self, place and time but intermittently confused to situation. Brought in by daughter who is not with her at time of triage. Pt limited historian.

## 2020-04-23 ENCOUNTER — Encounter: Payer: Self-pay | Admitting: Radiology

## 2020-04-23 ENCOUNTER — Emergency Department: Payer: Medicare HMO

## 2020-04-23 ENCOUNTER — Inpatient Hospital Stay
Admission: EM | Admit: 2020-04-23 | Discharge: 2020-04-24 | DRG: 871 | Disposition: A | Discharge status: Home Health | Payer: Medicare HMO | Attending: Internal Medicine | Admitting: Internal Medicine

## 2020-04-23 ENCOUNTER — Inpatient Hospital Stay: Payer: Medicare HMO

## 2020-04-23 DIAGNOSIS — Z79890 Hormone replacement therapy (postmenopausal): Secondary | ICD-10-CM | POA: Diagnosis not present

## 2020-04-23 DIAGNOSIS — Z7984 Long term (current) use of oral hypoglycemic drugs: Secondary | ICD-10-CM | POA: Diagnosis not present

## 2020-04-23 DIAGNOSIS — R5383 Other fatigue: Secondary | ICD-10-CM | POA: Diagnosis not present

## 2020-04-23 DIAGNOSIS — J45909 Unspecified asthma, uncomplicated: Secondary | ICD-10-CM | POA: Diagnosis present

## 2020-04-23 DIAGNOSIS — J3489 Other specified disorders of nose and nasal sinuses: Secondary | ICD-10-CM | POA: Diagnosis not present

## 2020-04-23 DIAGNOSIS — R410 Disorientation, unspecified: Secondary | ICD-10-CM | POA: Diagnosis present

## 2020-04-23 DIAGNOSIS — N136 Pyonephrosis: Secondary | ICD-10-CM | POA: Diagnosis present

## 2020-04-23 DIAGNOSIS — I6389 Other cerebral infarction: Secondary | ICD-10-CM | POA: Diagnosis not present

## 2020-04-23 DIAGNOSIS — Z7982 Long term (current) use of aspirin: Secondary | ICD-10-CM | POA: Diagnosis not present

## 2020-04-23 DIAGNOSIS — M069 Rheumatoid arthritis, unspecified: Secondary | ICD-10-CM | POA: Diagnosis present

## 2020-04-23 DIAGNOSIS — Z8673 Personal history of transient ischemic attack (TIA), and cerebral infarction without residual deficits: Secondary | ICD-10-CM

## 2020-04-23 DIAGNOSIS — F0390 Unspecified dementia without behavioral disturbance: Secondary | ICD-10-CM | POA: Diagnosis present

## 2020-04-23 DIAGNOSIS — N390 Urinary tract infection, site not specified: Secondary | ICD-10-CM | POA: Diagnosis not present

## 2020-04-23 DIAGNOSIS — J90 Pleural effusion, not elsewhere classified: Secondary | ICD-10-CM | POA: Diagnosis not present

## 2020-04-23 DIAGNOSIS — G9341 Metabolic encephalopathy: Secondary | ICD-10-CM

## 2020-04-23 DIAGNOSIS — Z9049 Acquired absence of other specified parts of digestive tract: Secondary | ICD-10-CM | POA: Diagnosis not present

## 2020-04-23 DIAGNOSIS — G9389 Other specified disorders of brain: Secondary | ICD-10-CM | POA: Diagnosis not present

## 2020-04-23 DIAGNOSIS — K219 Gastro-esophageal reflux disease without esophagitis: Secondary | ICD-10-CM | POA: Diagnosis present

## 2020-04-23 DIAGNOSIS — E039 Hypothyroidism, unspecified: Secondary | ICD-10-CM | POA: Diagnosis present

## 2020-04-23 DIAGNOSIS — R531 Weakness: Secondary | ICD-10-CM | POA: Diagnosis not present

## 2020-04-23 DIAGNOSIS — I6782 Cerebral ischemia: Secondary | ICD-10-CM | POA: Diagnosis not present

## 2020-04-23 DIAGNOSIS — K449 Diaphragmatic hernia without obstruction or gangrene: Secondary | ICD-10-CM | POA: Diagnosis not present

## 2020-04-23 DIAGNOSIS — R509 Fever, unspecified: Secondary | ICD-10-CM | POA: Diagnosis not present

## 2020-04-23 DIAGNOSIS — B9620 Unspecified Escherichia coli [E. coli] as the cause of diseases classified elsewhere: Secondary | ICD-10-CM | POA: Diagnosis present

## 2020-04-23 DIAGNOSIS — A419 Sepsis, unspecified organism: Secondary | ICD-10-CM

## 2020-04-23 DIAGNOSIS — M109 Gout, unspecified: Secondary | ICD-10-CM

## 2020-04-23 DIAGNOSIS — Z853 Personal history of malignant neoplasm of breast: Secondary | ICD-10-CM | POA: Diagnosis not present

## 2020-04-23 DIAGNOSIS — Z90710 Acquired absence of both cervix and uterus: Secondary | ICD-10-CM | POA: Diagnosis not present

## 2020-04-23 DIAGNOSIS — E1142 Type 2 diabetes mellitus with diabetic polyneuropathy: Secondary | ICD-10-CM | POA: Diagnosis present

## 2020-04-23 DIAGNOSIS — Z20822 Contact with and (suspected) exposure to covid-19: Secondary | ICD-10-CM | POA: Diagnosis present

## 2020-04-23 DIAGNOSIS — I10 Essential (primary) hypertension: Secondary | ICD-10-CM | POA: Diagnosis present

## 2020-04-23 HISTORY — DX: Metabolic encephalopathy: G93.41

## 2020-04-23 LAB — CBC
HCT: 44.8 % (ref 36.0–46.0)
Hemoglobin: 14.3 g/dL (ref 12.0–15.0)
MCH: 28.3 pg (ref 26.0–34.0)
MCHC: 31.9 g/dL (ref 30.0–36.0)
MCV: 88.5 fL (ref 80.0–100.0)
Platelets: 258 10*3/uL (ref 150–400)
RBC: 5.06 MIL/uL (ref 3.87–5.11)
RDW: 14.9 % (ref 11.5–15.5)
WBC: 14.2 10*3/uL — ABNORMAL HIGH (ref 4.0–10.5)
nRBC: 0 % (ref 0.0–0.2)

## 2020-04-23 LAB — GLUCOSE, CAPILLARY
Glucose-Capillary: 133 mg/dL — ABNORMAL HIGH (ref 70–99)
Glucose-Capillary: 122 mg/dL — ABNORMAL HIGH (ref 70–99)
Glucose-Capillary: 133 mg/dL — ABNORMAL HIGH (ref 70–99)

## 2020-04-23 LAB — CREATININE, SERUM
Creatinine, Ser: 1.41 mg/dL — ABNORMAL HIGH (ref 0.44–1.00)
GFR, Estimated: 37 mL/min — ABNORMAL LOW (ref >60)

## 2020-04-23 LAB — BLOOD GAS, ARTERIAL
Acid-Base Excess: 6 mmol/L — ABNORMAL HIGH (ref 0.0–2.0)
Bicarbonate: 30.6 mmol/L — ABNORMAL HIGH (ref 20.0–28.0)
FIO2: 0.21
O2 Saturation: 55.1 %
Patient temperature: 37
pCO2 arterial: 43 mmHg (ref 32.0–48.0)
pH, Arterial: 7.46 — ABNORMAL HIGH (ref 7.350–7.450)
pO2, Arterial: <31 mmHg — CL (ref 83.0–108.0)

## 2020-04-23 LAB — BASIC METABOLIC PANEL
Anion gap: 11 (ref 5–15)
BUN: 18 mg/dL (ref 8–23)
CO2: 27 mmol/L (ref 22–32)
Calcium: 9.1 mg/dL (ref 8.9–10.3)
Chloride: 98 mmol/L (ref 98–111)
Creatinine, Ser: 1.41 mg/dL — ABNORMAL HIGH (ref 0.44–1.00)
GFR, Estimated: 37 mL/min — ABNORMAL LOW (ref >60)
Glucose, Bld: 153 mg/dL — ABNORMAL HIGH (ref 70–99)
Potassium: 3.5 mmol/L (ref 3.5–5.1)
Sodium: 136 mmol/L (ref 135–145)

## 2020-04-23 LAB — TROPONIN I (HIGH SENSITIVITY)
Troponin I (High Sensitivity): 18 ng/L — ABNORMAL HIGH (ref <18)
Troponin I (High Sensitivity): 12 ng/L (ref <18)

## 2020-04-23 LAB — PROTIME-INR
INR: 1.1 (ref 0.8–1.2)
Prothrombin Time: 13.8 seconds (ref 11.4–15.2)

## 2020-04-23 LAB — CORTISOL-AM, BLOOD: Cortisol - AM: 20.5 ug/dL (ref 6.7–22.6)

## 2020-04-23 LAB — PROCALCITONIN: Procalcitonin: 0.32 ng/mL

## 2020-04-23 LAB — HEMOGLOBIN A1C
Hgb A1c MFr Bld: 6.1 % — ABNORMAL HIGH (ref 4.8–5.6)
Mean Plasma Glucose: 128.37 mg/dL

## 2020-04-23 LAB — CBG MONITORING, ED: Glucose-Capillary: 138 mg/dL — ABNORMAL HIGH (ref 70–99)

## 2020-04-23 LAB — APTT: aPTT: 31 seconds (ref 24–36)

## 2020-04-23 MED ORDER — ONDANSETRON HCL 4 MG/2ML IJ SOLN
4.0000 mg | Freq: Four times a day (QID) | INTRAMUSCULAR | Status: DC | PRN
  Administered 2020-04-24: 11:00:00 4 mg via INTRAVENOUS
  Filled 2020-04-23: qty 2

## 2020-04-23 MED ORDER — ACETAMINOPHEN 650 MG RE SUPP: 650.0000 mg | Freq: Four times a day (QID) | RECTAL | Status: DC | PRN

## 2020-04-23 MED ORDER — PANTOPRAZOLE SODIUM 40 MG PO TBEC
40.0000 mg | DELAYED_RELEASE_TABLET | Freq: Every day | ORAL | Status: DC
  Administered 2020-04-23 – 2020-04-24 (×2): 40 mg via ORAL
  Filled 2020-04-23 (×2): qty 1

## 2020-04-23 MED ORDER — DONEPEZIL HCL 5 MG PO TABS
10.0000 mg | ORAL_TABLET | Freq: Every day | ORAL | Status: DC
  Administered 2020-04-23: 10 mg via ORAL
  Filled 2020-04-23: qty 2

## 2020-04-23 MED ORDER — DORZOLAMIDE HCL 2 % OP SOLN
1.0000 [drp] | Freq: Two times a day (BID) | OPHTHALMIC | Status: DC
  Administered 2020-04-23 – 2020-04-24 (×2): 1 [drp] via OPHTHALMIC
  Filled 2020-04-23: qty 10

## 2020-04-23 MED ORDER — SUMATRIPTAN SUCCINATE 50 MG PO TABS
25.0000 mg | ORAL_TABLET | Freq: Two times a day (BID) | ORAL | Status: DC | PRN
  Administered 2020-04-23: 25 mg via ORAL
  Filled 2020-04-23 (×3): qty 1

## 2020-04-23 MED ORDER — GABAPENTIN 300 MG PO CAPS
300.0000 mg | ORAL_CAPSULE | Freq: Two times a day (BID) | ORAL | Status: DC
Start: 2020-04-23 — End: 2020-04-24
  Administered 2020-04-23 – 2020-04-24 (×2): 300 mg via ORAL
  Filled 2020-04-23 (×2): qty 1

## 2020-04-23 MED ORDER — LACTATED RINGERS IV SOLN: INTRAVENOUS | Status: DC

## 2020-04-23 MED ORDER — PILOCARPINE HCL 2 % OP SOLN
1.0000 [drp] | Freq: Four times a day (QID) | OPHTHALMIC | Status: DC
  Administered 2020-04-23 – 2020-04-24 (×4): 1 [drp] via OPHTHALMIC
  Filled 2020-04-23: qty 15

## 2020-04-23 MED ORDER — PROPRANOLOL HCL 40 MG PO TABS
40.0000 mg | ORAL_TABLET | Freq: Two times a day (BID) | ORAL | Status: DC
  Administered 2020-04-23 – 2020-04-24 (×2): 40 mg via ORAL
  Filled 2020-04-23 (×3): qty 1

## 2020-04-23 MED ORDER — HYDRALAZINE HCL 20 MG/ML IJ SOLN
10.0000 mg | INTRAMUSCULAR | Status: DC | PRN
  Administered 2020-04-23 – 2020-04-24 (×2): 10 mg via INTRAVENOUS
  Filled 2020-04-23 (×2): qty 1

## 2020-04-23 MED ORDER — SODIUM CHLORIDE 0.9 % IV SOLN
1.0000 g | Freq: Once | INTRAVENOUS | Status: AC
  Administered 2020-04-23: 1 g via INTRAVENOUS
  Filled 2020-04-23: qty 10

## 2020-04-23 MED ORDER — INSULIN ASPART 100 UNIT/ML ~~LOC~~ SOLN
0.0000 [IU] | Freq: Three times a day (TID) | SUBCUTANEOUS | Status: DC
  Administered 2020-04-23 – 2020-04-24 (×5): 2 [IU] via SUBCUTANEOUS
  Filled 2020-04-23 (×5): qty 1

## 2020-04-23 MED ORDER — ONDANSETRON HCL 4 MG PO TABS: 4.0000 mg | ORAL_TABLET | Freq: Four times a day (QID) | ORAL | Status: DC | PRN

## 2020-04-23 MED ORDER — HYDROCODONE-ACETAMINOPHEN 5-325 MG PO TABS
1.0000 | ORAL_TABLET | ORAL | Status: DC | PRN
  Administered 2020-04-24: 1 via ORAL
  Filled 2020-04-23: qty 1

## 2020-04-23 MED ORDER — SODIUM CHLORIDE 0.9 % IV SOLN
1.0000 g | INTRAVENOUS | Status: DC
  Administered 2020-04-23: 21:00:00 1 g via INTRAVENOUS
  Filled 2020-04-23 (×2): qty 10

## 2020-04-23 MED ORDER — ACETAMINOPHEN 325 MG PO TABS: 650.0000 mg | ORAL_TABLET | Freq: Four times a day (QID) | ORAL | Status: DC | PRN

## 2020-04-23 MED ORDER — INSULIN ASPART 100 UNIT/ML ~~LOC~~ SOLN: 0.0000 [IU] | Freq: Every day | SUBCUTANEOUS | Status: DC

## 2020-04-23 MED ORDER — ENOXAPARIN SODIUM 40 MG/0.4ML ~~LOC~~ SOLN
40.0000 mg | SUBCUTANEOUS | Status: DC
  Administered 2020-04-23: 40 mg via SUBCUTANEOUS
  Filled 2020-04-23: qty 0.4

## 2020-04-23 MED ORDER — IOHEXOL 300 MG/ML  SOLN
100.0000 mL | Freq: Once | INTRAMUSCULAR | Status: AC | PRN
  Administered 2020-04-23: 100 mL via INTRAVENOUS

## 2020-04-23 MED ORDER — FLUTICASONE PROPIONATE 50 MCG/ACT NA SUSP
1.0000 | Freq: Every day | NASAL | Status: DC
  Administered 2020-04-23 – 2020-04-24 (×2): 1 via NASAL
  Filled 2020-04-23: qty 16

## 2020-04-23 MED ORDER — ENOXAPARIN SODIUM 30 MG/0.3ML ~~LOC~~ SOLN
30.0000 mg | SUBCUTANEOUS | Status: DC
  Administered 2020-04-24: 09:00:00 30 mg via SUBCUTANEOUS
  Filled 2020-04-23: qty 0.3

## 2020-04-23 MED ORDER — LATANOPROST 0.005 % OP SOLN
1.0000 [drp] | Freq: Every day | OPHTHALMIC | Status: DC
  Administered 2020-04-23: 1 [drp] via OPHTHALMIC
  Filled 2020-04-23: qty 2.5

## 2020-04-23 MED ORDER — LEVOTHYROXINE SODIUM 112 MCG PO TABS
112.0000 ug | ORAL_TABLET | Freq: Every day | ORAL | Status: DC
  Administered 2020-04-24: 112 ug via ORAL
  Filled 2020-04-23 (×2): qty 1

## 2020-04-23 MED ORDER — SODIUM CHLORIDE 0.9 % IV BOLUS (SEPSIS)
1000.0000 mL | Freq: Once | INTRAVENOUS | Status: AC
  Administered 2020-04-23: 1000 mL via INTRAVENOUS

## 2020-04-23 MED ORDER — DORZOLAMIDE HCL-TIMOLOL MAL 2-0.5 % OP SOLN: 1.0000 [drp] | Freq: Two times a day (BID) | OPHTHALMIC | Status: DC

## 2020-04-23 MED ORDER — TIMOLOL MALEATE 0.5 % OP SOLN
1.0000 [drp] | Freq: Two times a day (BID) | OPHTHALMIC | Status: DC
  Administered 2020-04-23 – 2020-04-24 (×2): 1 [drp] via OPHTHALMIC
  Filled 2020-04-23: qty 5

## 2020-04-23 NOTE — ED Provider Notes (Signed)
 [facility] Emergency Department Provider Note   ____________________________________________   Event Date/Time   First MD Initiated Contact with Patient 04/23/20 0009     (approximate)  I have reviewed the triage vital signs and the nursing notes.   HISTORY  Chief Complaint Fever and Weakness   HPI Shannon Higgins is a 85 y.o. female who comes in with a history of fever and weakness for about 2 weeks.  She is intermittently confused to the situation.  Brought in by her daughter who is not with her all the time and not with her currently.  Patient is not currently febrile but she is tachycardic and has an elevated white blood count.  She meets sepsis criteria.  Additionally on exam she complains of some right lower quadrant tenderness.         Past Medical History:  Diagnosis Date  . Acute tubular injury of transplanted kidney (HCC) 06/14/2016  . AKI (acute kidney injury) (HCC) 06/14/2016  . Asthma   . Breast cancer (HCC)   . CAP (community acquired pneumonia) 04/29/2012  . Chronic sinusitis   . Confusion 12/06/2016  . Diabetes mellitus without complication (HCC)   . GERD (gastroesophageal reflux disease)   . History of ankle fracture 05/14/2018   Last Assessment & Plan:  With a recent fall. Right medial malleous. Has ortho follow up soon, splinted now and tylenol helps her pain  . History of recurrent UTIs   . Hypertension   . Hypokalemia 04/26/2012  . Hyponatremia   . Insomnia   . Migraine   . Neuropathic pain   . Pneumonia   . Rheumatoid arthritis (HCC)   . Sepsis (HCC) 04/26/2012  . Sleep apnea   . Stroke (HCC)   . Thyroid disease     Patient Active Problem List   Diagnosis Date Noted  . Acute cystitis without hematuria 11/21/2019  . Imbalance 09/02/2019  . Preventative health care 03/04/2019  . Insomnia 09/27/2018  . History of stroke 09/13/2018  . Recurrent UTI 06/20/2018  . Frequent falls 05/14/2018  . Gout attack 05/14/2018   . Closed fracture of medial malleolus 05/11/2018  . Type 2 diabetes mellitus with peripheral neuropathy (HCC) 05/11/2018  . Type 2 diabetes mellitus (HCC) 05/11/2018  . Orthostatic hypotension 05/08/2018  . Urinary tract infectious disease   . Malignant neoplasm of upper-outer quadrant of female breast (HCC) 10/17/2017  . Major depressive disorder 06/21/2017  . Chronic low back pain 01/13/2017  . Hyperlipidemia 12/06/2016  . Glaucoma 12/06/2016  . Neuropathic pain 12/06/2016  . Malignant melanoma of skin (HCC) 12/06/2016  . Dementia arising in the senium and presenium (HCC) 12/06/2016  . Chronic headache disorder 12/06/2016  . Transient hypotension 06/14/2016  . Hypothyroidism 06/14/2016  . Anemia 04/26/2012  . Hypertensive disorder 04/26/2012  . Gastroesophageal reflux disease 04/26/2012  . Obstructive sleep apnea syndrome 04/26/2012    Past Surgical History:  Procedure Laterality Date  . ABDOMINAL HYSTERECTOMY    . BACK SURGERY    . BREAST BIOPSY Right   . BREAST LUMPECTOMY Left 2015  . CHOLECYSTECTOMY    . KNEE SURGERY    . SHOULDER SURGERY      Prior to Admission medications   Medication Sig Start Date End Date Taking? Authorizing Provider  Accu-Chek FastClix Lancets MISC Use as instructed to test blood sugar daily 04/23/19   Clark, Katherine K, NP  ACCU-CHEK GUIDE test strip Use as instructed to blood sugar daily 04/23/19   Clark, Katherine   K, NP  albuterol (VENTOLIN HFA) 108 (90 Base) MCG/ACT inhaler Inhale 2 puffs into the lungs every 4 (four) hours as needed for shortness of breath. 08/27/18   Clark, Katherine K, NP  aspirin 325 MG tablet Take 1 tablet (325 mg total) by mouth daily. 09/15/18   Patel, Sona, MD  benzonatate (TESSALON PERLES) 100 MG capsule Take 1 capsule (100 mg total) by mouth 3 (three) times daily as needed for cough. 08/06/19   Cody, Jessica R, MD  blood glucose meter kit and supplies Dispense based on patient and insurance preference. Use up to 2 times  daily as directed. (FOR ICD-10 E10.9, E11.9). 03/04/19   Clark, Katherine K, NP  cetirizine (ZYRTEC) 10 MG chewable tablet Chew 1 tablet by mouth once daily as needed for allergies. 12/26/17   Clark, Katherine K, NP  donepezil (ARICEPT) 10 MG tablet Take 1 tablet daily 05/15/19   Aquino, Karen M, MD  dorzolamide-timolol (COSOPT) 22.3-6.8 MG/ML ophthalmic solution Place 1 drop into both eyes 2 (two) times daily.    [provider]  DULoxetine (CYMBALTA) 60 MG capsule TAKE 1 CAPSULE EVERY DAY  FOR  DEPRESSION 03/21/19   Clark, Katherine K, NP  fluticasone (FLONASE) 50 MCG/ACT nasal spray Place 1 spray into both nostrils daily. 10/30/19   Clark, Katherine K, NP  gabapentin (NEURONTIN) 800 MG tablet TAKE 1 TABLET BY MOUTH THREE TIMES A DAY 08/30/19   Clark, Katherine K, NP  latanoprost (XALATAN) 0.005 % ophthalmic solution Place 1 drop into both eyes at bedtime.    [provider]  levothyroxine (SYNTHROID) 112 MCG tablet Take 1 tablet by mouth every morning on an empty stomach with water only.  No food or other medications for 30 minutes. 04/12/19   Clark, Katherine K, NP  meclizine (ANTIVERT) 25 MG tablet Take 1 tablet (25 mg total) by mouth 2 (two) times daily as needed for dizziness. 09/02/19   Clark, Katherine K, NP  meloxicam (MOBIC) 15 MG tablet Take 1 tablet (15 mg total) by mouth daily as needed for pain. 10/30/19   Clark, Katherine K, NP  metFORMIN (GLUCOPHAGE) 500 MG tablet TAKE 1 TABLET BY MOUTH TWICE DAILY WITH FOOD FOR DIABETES. 01/01/19   Clark, Katherine K, NP  Netarsudil Dimesylate (RHOPRESSA) 0.02 % SOLN INSTILL 1 DROP IN BOTH EYES EVERY DAY 12/04/18   [provider]  omeprazole (PRILOSEC) 20 MG capsule TAKE 1 CAPSULE BY MOUTH TWICE DAILY FOR HEARTBURN. 10/30/19   Clark, Katherine K, NP  pilocarpine (PILOCAR) 2 % ophthalmic solution Place 1 drop into both eyes 4 (four) times daily.  02/19/19   [provider]  propranolol (INDERAL) 80 MG tablet TAKE 1 TABLET BY  MOUTH ONCE DAILY FOR BLOOD PRESSURE. 03/21/19   Clark, Katherine K, NP  Rimegepant Sulfate (NURTEC) 75 MG TBDP Take 1 tablet by mouth as needed. Take 1 tablet as needed for migraine. Do not take more than 1 in 24 hours. 05/15/19   Aquino, Karen M, MD  rosuvastatin (CRESTOR) 10 MG tablet TAKE 1 TABLET BY MOUTH ONCE DAILY FOR CHOLESTEROL. 01/01/19   Clark, Katherine K, NP  SUMAtriptan (IMITREX) 25 MG tablet Take as needed for migraine. Do not take more than twice a week 10/01/18   Aquino, Karen M, MD  traZODone (DESYREL) 100 MG tablet Take two tablets at bedtime as needed for sleep 03/30/20   Clark, Katherine K, NP    Allergies Patient has no known allergies.  Family History  Problem Relation Age of Onset  .   Diabetes Daughter   . Hypertension Daughter   . Fibromyalgia Daughter   . GER disease Daughter   . Fibromyalgia Daughter   . Crohn's disease Daughter   . Asthma Daughter   . Hypertension Mother     Social History Social History   Tobacco Use  . Smoking status: Never Smoker  . Smokeless tobacco: Never Used  Vaping Use  . Vaping Use: Never used  Substance Use Topics  . Alcohol use: No  . Drug use: No    Review of Systems  Constitutional: Currently no fever/chills Eyes: No visual changes. ENT: No sore throat. Cardiovascular: Denies chest pain. Respiratory: Denies shortness of breath. Gastrointestinal: Right lower quadrant abdominal pain.  No nausea, no vomiting.  No diarrhea.  No constipation. Genitourinary: Negative for dysuria. Musculoskeletal: Negative for back pain. Skin: Negative for rash. Neurological: Negative for headaches, focal weakness   ____________________________________________   PHYSICAL EXAM:  VITAL SIGNS: ED Triage Vitals  Enc Vitals Group     BP 04/22/20 1847 137/63     Pulse Rate 04/22/20 1847 (!) 103     Resp 04/22/20 1847 18     Temp 04/22/20 1847 (!) 100.4 F (38 C)     Temp Source 04/22/20 1847 Oral     SpO2 04/22/20 1847 94 %      Weight 04/22/20 1848 154 lb (69.9 kg)     Height 04/22/20 1848 5' 4" (1.626 m)     Head Circumference --      Peak Flow --      Pain Score 04/22/20 1848 0     Pain Loc --      Pain Edu? --      Excl. in GC? --     Constitutional: Alert and oriented. Well appearing and in no acute distress. Eyes: Conjunctivae are normal. PER  Head: Atraumatic. Nose: No congestion/rhinnorhea. Mouth/Throat: Mucous membranes are moist.  Oropharynx non-erythematous. Neck: No stridor. Cardiovascular: Normal rate, regular rhythm. Grossly normal heart sounds.  Good peripheral circulation. Respiratory: Normal respiratory effort.  No retractions. Lungs CTAB. Gastrointestinal: Soft and nontender except to palpation in the right lower quadrant. No distention. No abdominal bruits. No Musculoskeletal: No lower extremity tenderness nor edema.  Neurologic:  Normal speech and language. No gross focal neurologic deficits are appreciated.  Skin:  Skin is warm, dry and intact. No rash noted.   ____________________________________________   LABS (all labs ordered are listed, but only abnormal results are displayed)  Labs Reviewed  BASIC METABOLIC PANEL - Abnormal; Notable for the following components:      Result Value   Potassium 3.4 (*)    Glucose, Bld 165 (*)    All other components within normal limits  CBC - Abnormal; Notable for the following components:   WBC 16.3 (*)    All other components within normal limits  URINALYSIS, COMPLETE (UACMP) WITH MICROSCOPIC - Abnormal; Notable for the following components:   Color, Urine YELLOW (*)    APPearance CLOUDY (*)    Hgb urine dipstick SMALL (*)    Protein, ur 100 (*)    Leukocytes,Ua LARGE (*)    WBC, UA >50 (*)    Bacteria, UA FEW (*)    All other components within normal limits  TROPONIN I (HIGH SENSITIVITY) - Abnormal; Notable for the following components:   Troponin I (High Sensitivity) 18 (*)    All other components within normal limits  RESP PANEL  BY RT-PCR (FLU A&B, COVID) [id number]  URINE CULTURE  CULTURE,   BLOOD (ROUTINE X 2)  CULTURE, BLOOD (ROUTINE X 2)  LACTIC ACID, PLASMA  HEPATIC FUNCTION PANEL  MAGNESIUM  PROTIME-INR  APTT  LACTIC ACID, PLASMA  TROPONIN I (HIGH SENSITIVITY)   ____________________________________________  EKG EKG read interpreted by me shows sinus tachycardia rate of 109 there is some slight ST segment depression in leads I and lead II but not in 3 after aVL.  ____________________________________________  RADIOLOGY I, ,   F, personally viewed and evaluated these images (plain radiographs) as part of my medical decision making, as well as reviewing the written report by the radiologist.  ED MD interpretation:  Official radiology report(s): CT ABDOMEN PELVIS W CONTRAST  Result Date: 04/23/2020 CLINICAL DATA:  Fever, fatigue and weakness x2 weeks. EXAM: CT ABDOMEN AND PELVIS WITH CONTRAST TECHNIQUE: Multidetector CT imaging of the abdomen and pelvis was performed using the standard protocol following bolus administration of intravenous contrast. CONTRAST:  100mL OMNIPAQUE IOHEXOL 300 MG/ML  SOLN COMPARISON:  None. FINDINGS: Lower chest: No acute abnormality. Hepatobiliary: No focal liver abnormality is seen. Status post cholecystectomy. No biliary dilatation. Pancreas: Unremarkable. No pancreatic ductal dilatation or surrounding inflammatory changes. Spleen: Normal in size without focal abnormality. Adrenals/Urinary Tract: Adrenal glands are unremarkable. The kidneys are normal in size. A 1.4 cm x 0.9 cm simple cyst is seen along the posteromedial aspect of the mid left kidney. There is marked severity right-sided hydronephrosis and hydroureter with mild right perinephric inflammatory fat stranding and delayed right renal cortical enhancement. No obstructing renal calculi are identified. The dilated right ureter extends to the level of a large pelvic cyst (see below), best seen on sagittal reformatted  images 52 through 55, CT series number 6). The urinary bladder is empty and subsequently limited in evaluation. Stomach/Bowel: There is a moderate-sized hiatal hernia. Appendix appears normal. No evidence of bowel wall thickening, distention, or inflammatory changes. Vascular/Lymphatic: Aortic atherosclerosis. No enlarged abdominal or pelvic lymph nodes. Reproductive: Status post hysterectomy. Other: A very large, approximately 13.5 cm x 11.8 cm x 15.2 cm simple, cystic appearing area is seen within the pelvis along the midline. This extends from the lower abdomen to the region just above the urinary bladder. Musculoskeletal: Marked severity multilevel degenerative changes seen throughout the lumbar spine. IMPRESSION: 1. Partial obstruction of the right kidney secondary to the presence of a very large, simple pelvic cyst, as described above. While this cyst may be ovarian in origin, correlation with pelvic ultrasound is recommended. 2. Moderate-sized hiatal hernia. 3. Evidence of prior cholecystectomy and hysterectomy. 4. Aortic atherosclerosis. Aortic Atherosclerosis (ICD10-I70.0). Electronically Signed   By: Thaddeus  Houston M.D.   On: 04/23/2020 02:07   DG Chest Portable 1 View  Result Date: 04/23/2020 CLINICAL DATA:  Fever, fatigue and weakness for 2 weeks EXAM: PORTABLE CHEST 1 VIEW COMPARISON:  Radiograph 01/20/2020 FINDINGS: Chronically coarsened interstitial and bronchitic features are similar to comparison exam. Some additional scarring is again seen in the lingula. No new consolidative opacity. No pneumothorax or effusion. The aorta is calcified. Small hiatal hernia. The remaining cardiomediastinal contours are unremarkable. Bilateral shoulder arthroplasties. Postsurgical changes in the left axillary region and left breast. Telemetry leads overlie the chest. IMPRESSION: 1. Chronically coarsened interstitial and bronchitic features, similar to comparison exam. 2. No acute cardiopulmonary abnormality.  3. Small hiatal hernia. Electronically Signed   By: Price  DeHay M.D.   On: 04/23/2020 00:46    ____________________________________________   PROCEDURES  Procedure(s) performed (including Critical Care): Critical care time 25 minutes at least 5 minutes   of which were spent looking for sepsis caused by unknown organism in the computer.  Procedures   ____________________________________________   INITIAL IMPRESSION / ASSESSMENT AND PLAN / ED COURSE  Patient with waxing and waning mental status which is the definition of delirium.  She additionally has a UTI and had a heart rate over 90 and a white count over 12,000 meeting sepsis criteria.  She does not appear to be actually clinically septic however.  I will admit her to treat her UTI and delirium I cannot find the diagnosis of sepsis caused by unknown organism in the computer any longer.  So I cannot put that in my diagnosis although she does meet sepsis criteria.             ____________________________________________   FINAL CLINICAL IMPRESSION(S) / ED DIAGNOSES  Final diagnoses:  Urinary tract infection without hematuria, site unspecified  Delirium     ED Discharge Orders    None      *Please note:  Shannon Higgins was evaluated in Emergency Department on 04/23/2020 for the symptoms described in the history of present illness. She was evaluated in the context of the global COVID-19 pandemic, which necessitated consideration that the patient might be at risk for infection with the SARS-CoV-2 virus that causes COVID-19. Institutional protocols and algorithms that pertain to the evaluation of patients at risk for COVID-19 are in a state of rapid change based on information released by regulatory bodies including the CDC and federal and state organizations. These policies and algorithms were followed during the patient's care in the ED.  Some ED evaluations and interventions may be delayed as a result of limited staffing  during and the pandemic.*   Note:  This document was prepared using Dragon voice recognition software and may include unintentional dictation errors.    ,  F, MD 04/23/20 0212  

## 2020-04-23 NOTE — Progress Notes (Signed)
Returned from CT.

## 2020-04-23 NOTE — H&P (Signed)
History and Physical    ESBEIDY MCLAINE HFW:[id number] DOB: [birth year] DOA: 04/23/2020  PCP: Pleas Koch, NP   Patient coming from: Home  I have personally briefly reviewed patient's old medical records in [place]  Chief Complaint: Fever, weakness  HPI: Shannon Higgins is a 85 y.o. female with medical history significant for Dementia, HTN, hypothyroidism, diabetes with neuropathy, TIA and gout as well as recurrent UTI, who presents to the emergency room with a several day history of weakness, confusion and intermittent fever. She denies cough or shortness of breath. Denies chest pain, abdominal pain, vomiting or diarrhea ED course: On arrival she was febrile at 100.4, tachycardic at 103 with blood pressure 137/63 and O2 sat 94% on room air.  Blood work significant for leukocytosis of 16,000, with lactic acid 1.6.  Urinalysis with pyuria.  LFTs within normal limits, troponin of 18. Covid and flu negative EKG as interpreted by me:, sinus tachycardia at 109 with nonspecific ST-T wave changes Imaging: CT abdomen and pelvis showing possible ovarian cyst with partial obstruction of the right kidney Chest x-ray chronically coarsened interstitial and bronchitic features similar to prior exams  Patient started on Rocephin and sepsis protocol.  Hospitalist consulted for admission.   Review of Systems: As per HPI otherwise all other systems on review of systems negative.    Past Medical History:  Diagnosis Date  . Acute tubular injury of transplanted kidney ([place]) 06/14/2016  . AKI (acute kidney injury) ([place]) 06/14/2016  . Asthma   . Breast cancer ([place])   . CAP (community acquired pneumonia) 04/29/2012  . Chronic sinusitis   . Confusion 12/06/2016  . Diabetes mellitus without complication ([place])   . GERD (gastroesophageal reflux disease)   . History of ankle fracture 05/14/2018   Last Assessment & Plan:  With a recent fall. Right medial malleous. Has ortho follow up soon, splinted  now and tylenol helps her pain  . History of recurrent UTIs   . Hypertension   . Hypokalemia 04/26/2012  . Hyponatremia   . Insomnia   . Migraine   . Neuropathic pain   . Pneumonia   . Rheumatoid arthritis ([place])   . Sepsis ([place]) 04/26/2012  . Sleep apnea   . Stroke ([place])   . Thyroid disease     Past Surgical History:  Procedure Laterality Date  . ABDOMINAL HYSTERECTOMY    . BACK SURGERY    . BREAST BIOPSY Right   . BREAST LUMPECTOMY Left 2015  . CHOLECYSTECTOMY    . KNEE SURGERY    . SHOULDER SURGERY       reports that she has never smoked. She has never used smokeless tobacco. She reports that she does not drink alcohol and does not use drugs.  No Known Allergies  Family History  Problem Relation Age of Onset  . Diabetes Daughter   . Hypertension Daughter   . Fibromyalgia Daughter   . GER disease Daughter   . Fibromyalgia Daughter   . Crohn's disease Daughter   . Asthma Daughter   . Hypertension Mother       Prior to Admission medications   Medication Sig Start Date End Date Taking? Authorizing Provider  Accu-Chek FastClix Lancets MISC Use as instructed to test blood sugar daily 04/23/19   Pleas Koch, NP  ACCU-CHEK GUIDE test strip Use as instructed to blood sugar daily 04/23/19   Pleas Koch, NP  albuterol (VENTOLIN HFA) 108 (90 Base) MCG/ACT inhaler Inhale 2 puffs into  the lungs every 4 (four) hours as needed for shortness of breath. 08/27/18   Pleas Koch, NP  aspirin 325 MG tablet Take 1 tablet (325 mg total) by mouth daily. 09/15/18   Fritzi Mandes, MD  benzonatate (TESSALON PERLES) 100 MG capsule Take 1 capsule (100 mg total) by mouth 3 (three) times daily as needed for cough. 08/06/19   [name], MD  blood glucose meter kit and supplies Dispense based on patient and insurance preference. Use up to 2 times daily as directed. (FOR ICD-10 E10.9, E11.9). 03/04/19   Pleas Koch, NP  cetirizine (ZYRTEC) 10 MG chewable tablet Chew 1  tablet by mouth once daily as needed for allergies. 12/26/17   Pleas Koch, NP  donepezil (ARICEPT) 10 MG tablet Take 1 tablet daily 05/15/19   Cameron Sprang, MD  dorzolamide-timolol (COSOPT) 22.3-6.8 MG/ML ophthalmic solution Place 1 drop into both eyes 2 (two) times daily.    [provider]  DULoxetine (CYMBALTA) 60 MG capsule TAKE 1 CAPSULE EVERY DAY  FOR  DEPRESSION 03/21/19   Pleas Koch, NP  fluticasone [facility]) 50 MCG/ACT nasal spray Place 1 spray into both nostrils daily. 10/30/19   Pleas Koch, NP  gabapentin (NEURONTIN) 800 MG tablet TAKE 1 TABLET BY MOUTH THREE TIMES A DAY 08/30/19   Pleas Koch, NP  latanoprost (XALATAN) 0.005 % ophthalmic solution Place 1 drop into both eyes at bedtime.    [provider]  levothyroxine (SYNTHROID) 112 MCG tablet Take 1 tablet by mouth every morning on an empty stomach with water only.  No food or other medications for 30 minutes. 04/12/19   Pleas Koch, NP  meclizine (ANTIVERT) 25 MG tablet Take 1 tablet (25 mg total) by mouth 2 (two) times daily as needed for dizziness. 09/02/19   Pleas Koch, NP  meloxicam (MOBIC) 15 MG tablet Take 1 tablet (15 mg total) by mouth daily as needed for pain. 10/30/19   Pleas Koch, NP  metFORMIN (GLUCOPHAGE) 500 MG tablet TAKE 1 TABLET BY MOUTH TWICE DAILY WITH FOOD FOR DIABETES. 01/01/19   Pleas Koch, NP  Netarsudil Dimesylate (RHOPRESSA) 0.02 % SOLN INSTILL 1 DROP IN BOTH EYES EVERY DAY 12/04/18   [provider]  omeprazole (PRILOSEC) 20 MG capsule TAKE 1 CAPSULE BY MOUTH TWICE DAILY FOR HEARTBURN. 10/30/19   Pleas Koch, NP  pilocarpine (PILOCAR) 2 % ophthalmic solution Place 1 drop into both eyes 4 (four) times daily.  02/19/19   [provider]  propranolol (INDERAL) 80 MG tablet TAKE 1 TABLET BY MOUTH ONCE DAILY FOR BLOOD PRESSURE. 03/21/19   Pleas Koch, NP  Rimegepant Sulfate (NURTEC) 75 MG TBDP Take 1 tablet by  mouth as needed. Take 1 tablet as needed for migraine. Do not take more than 1 in 24 hours. 05/15/19   Cameron Sprang, MD  rosuvastatin (CRESTOR) 10 MG tablet TAKE 1 TABLET BY MOUTH ONCE DAILY FOR CHOLESTEROL. 01/01/19   Pleas Koch, NP  SUMAtriptan (IMITREX) 25 MG tablet Take as needed for migraine. Do not take more than twice a week 10/01/18   Cameron Sprang, MD  traZODone (DESYREL) 100 MG tablet Take two tablets at bedtime as needed for sleep 03/30/20   Pleas Koch, NP    Physical Exam: Vitals:   04/23/20 0100 04/23/20 0130 04/23/20 0215 04/23/20 0230  BP: (!) 119/54  (!) 121/56 115/60  Pulse: 90 76 80 79  Resp:  [name])  24    Temp:      TempSrc:      SpO2:  92% 93% 91%  Weight:      Height:         Vitals:   04/23/20 0100 04/23/20 0130 04/23/20 0215 04/23/20 0230  BP: (!) 119/54  (!) 121/56 115/60  Pulse: 90 76 80 79  Resp:  (!) 24    Temp:      TempSrc:      SpO2:  92% 93% 91%  Weight:      Height:          Constitutional: Alert and oriented x 3 . Not in any apparent distress HEENT:      Head: Normocephalic and atraumatic.         Eyes: PERLA, EOMI, Conjunctivae are normal. Sclera is non-icteric.       Mouth/Throat: Mucous membranes are moist.       Neck: Supple with no signs of meningismus. Cardiovascular:  Tachycardic. No murmurs, gallops, or rubs. 2+ symmetrical distal pulses are present . No JVD. No LE edema Respiratory: Respiratory effort normal .Lungs sounds clear bilaterally. No wheezes, crackles, or rhonchi.  Gastrointestinal: Soft, non tender, and non distended with positive bowel sounds.  Genitourinary: No CVA tenderness. Musculoskeletal: Nontender with normal range of motion in all extremities. No cyanosis, or erythema of extremities. Neurologic:  Face is symmetric. Moving all extremities. No gross focal neurologic deficits . Skin: Skin is warm, dry.  No rash or ulcers Psychiatric: Mood and affect are normal    Labs on Admission: I have  personally reviewed following labs and imaging studies  CBC: Recent Labs  Lab 04/22/20 1850  WBC 16.3*  HGB 12.5  HCT 38.8  MCV 87.6  PLT 443   Basic Metabolic Panel: Recent Labs  Lab 04/22/20 1850  NA 137  K 3.4*  CL 102  CO2 25  GLUCOSE 165*  BUN 15  CREATININE 0.88  CALCIUM 9.4  MG 1.9   GFR: Estimated Creatinine Clearance: 47.3 mL/min (by C-G formula based on SCr of 0.88 mg/dL). Liver Function Tests: Recent Labs  Lab 04/22/20 1850  AST 21  ALT 11  ALKPHOS 75  BILITOT 0.6  PROT 7.6  ALBUMIN 3.7   No results for input(s): LIPASE, AMYLASE in the last 168 hours. No results for input(s): AMMONIA in the last 168 hours. Coagulation Profile: Recent Labs  Lab 04/23/20 0057  INR 1.1   Cardiac Enzymes: No results for input(s): CKTOTAL, CKMB, CKMBINDEX, TROPONINI in the last 168 hours. BNP (last 3 results) No results for input(s): PROBNP in the last 8760 hours. HbA1C: No results for input(s): HGBA1C in the last 72 hours. CBG: No results for input(s): GLUCAP in the last 168 hours. Lipid Profile: No results for input(s): CHOL, HDL, LDLCALC, TRIG, CHOLHDL, LDLDIRECT in the last 72 hours. Thyroid Function Tests: No results for input(s): TSH, T4TOTAL, FREET4, T3FREE, THYROIDAB in the last 72 hours. Anemia Panel: No results for input(s): VITAMINB12, FOLATE, FERRITIN, TIBC, IRON, RETICCTPCT in the last 72 hours. Urine analysis:    Component Value Date/Time   COLORURINE YELLOW (A) 04/22/2020 2143   APPEARANCEUR CLOUDY (A) 04/22/2020 2143   APPEARANCEUR Clear 07/02/2018 1519   LABSPEC 1.011 04/22/2020 2143   PHURINE 6.0 04/22/2020 2143   GLUCOSEU NEGATIVE 04/22/2020 2143   HGBUR SMALL (A) 04/22/2020 2143   BILIRUBINUR NEGATIVE 04/22/2020 2143   BILIRUBINUR Negative 02/06/2020 1113   BILIRUBINUR Negative 07/02/2018 [place] 04/22/2020 2143  PROTEINUR 100 (A) 04/22/2020 2143   UROBILINOGEN 0.2 02/06/2020 1113   UROBILINOGEN 0.2 04/26/2012  1547   NITRITE NEGATIVE 04/22/2020 2143   LEUKOCYTESUR LARGE (A) 04/22/2020 2143    Radiological Exams on Admission: CT ABDOMEN PELVIS W CONTRAST  Result Date: 04/23/2020 CLINICAL DATA:  Fever, fatigue and weakness x2 weeks. EXAM: CT ABDOMEN AND PELVIS WITH CONTRAST TECHNIQUE: Multidetector CT imaging of the abdomen and pelvis was performed using the standard protocol following bolus administration of intravenous contrast. CONTRAST:  123m OMNIPAQUE IOHEXOL 300 MG/ML  SOLN COMPARISON:  None. FINDINGS: Lower chest: No acute abnormality. Hepatobiliary: No focal liver abnormality is seen. Status post cholecystectomy. No biliary dilatation. Pancreas: Unremarkable. No pancreatic ductal dilatation or surrounding inflammatory changes. Spleen: Normal in size without focal abnormality. Adrenals/Urinary Tract: Adrenal glands are unremarkable. The kidneys are normal in size. A 1.4 cm x 0.9 cm simple cyst is seen along the posteromedial aspect of the mid left kidney. There is marked severity right-sided hydronephrosis and hydroureter with mild right perinephric inflammatory fat stranding and delayed right renal cortical enhancement. No obstructing renal calculi are identified. The dilated right ureter extends to the level of a large pelvic cyst (see below), best seen on sagittal reformatted images 52 through 55, CT series number 6). The urinary bladder is empty and subsequently limited in evaluation. Stomach/Bowel: There is a moderate-sized hiatal hernia. Appendix appears normal. No evidence of bowel wall thickening, distention, or inflammatory changes. Vascular/Lymphatic: Aortic atherosclerosis. No enlarged abdominal or pelvic lymph nodes. Reproductive: Status post hysterectomy. Other: A very large, approximately 13.5 cm x 11.8 cm x 15.2 cm simple, cystic appearing area is seen within the pelvis along the midline. This extends from the lower abdomen to the region just above the urinary bladder. Musculoskeletal: Marked  severity multilevel degenerative changes seen throughout the lumbar spine. IMPRESSION: 1. Partial obstruction of the right kidney secondary to the presence of a very large, simple pelvic cyst, as described above. While this cyst may be ovarian in origin, correlation with pelvic ultrasound is recommended. 2. Moderate-sized hiatal hernia. 3. Evidence of prior cholecystectomy and hysterectomy. 4. Aortic atherosclerosis. Aortic Atherosclerosis (ICD10-I70.0). Electronically Signed   By: TVirgina NorfolkM.D.   On: 04/23/2020 02:07   DG Chest Portable 1 View  Result Date: 04/23/2020 CLINICAL DATA:  Fever, fatigue and weakness for 2 weeks EXAM: PORTABLE CHEST 1 VIEW COMPARISON:  Radiograph 01/20/2020 FINDINGS: Chronically coarsened interstitial and bronchitic features are similar to comparison exam. Some additional scarring is again seen in the lingula. No new consolidative opacity. No pneumothorax or effusion. The aorta is calcified. Small hiatal hernia. The remaining cardiomediastinal contours are unremarkable. Bilateral shoulder arthroplasties. Postsurgical changes in the left axillary region and left breast. Telemetry leads overlie the chest. IMPRESSION: 1. Chronically coarsened interstitial and bronchitic features, similar to comparison exam. 2. No acute cardiopulmonary abnormality. 3. Small hiatal hernia. Electronically Signed   By: PLovena LeM.D.   On: 04/23/2020 00:46     Assessment/Plan 85year old female with history of dementia, HTN, hypothyroidism, diabetes with neuropathy, TIA and gout as well as recurrent UTI, presenting with several day history of weakness, confusion and intermittent fever.  Work-up: Sepsis secondary to UTI      Sepsis secondary to UTI ([place]   Recurrent UTI -Patient with history of recurrent UTI presenting with confusion, fever, febrile and tachycardic in ER with leukocytosis of 16,000.  Lactic acid 1.6 -IV fluids -Rocephin -Fall and aspiration precautions     Hypothyroidism -Continue home levothyroxine  Dementia arising in the senium and presenium ([place]) -Continue Aricept    Type 2 diabetes mellitus with peripheral neuropathy (HCC) -Sliding scale insulin coverage -Continue neuropathy    History of TIA (transient ischemic attack) -Continue aspirin and rosuvastatin     DVT prophylaxis: Lovenox  Code Status: full code  Family Communication:  none  Disposition Plan: Back to previous home environment Consults called: none  Status:At the time of admission, it appears that the appropriate admission status for this patient is INPATIENT. This is judged to be reasonable and necessary in order to provide the required intensity of service to ensure the patient's safety given the presenting symptoms, physical exam findings, and initial radiographic and laboratory data in the context of their  Comorbid conditions.   Patient requires inpatient status due to high intensity of service, high risk for further deterioration and high frequency of surveillance required.   I certify that at the point of admission it is my clinical judgment that the patient will require inpatient hospital care spanning beyond [place] MD [facility]     04/23/2020, 3:09 AM

## 2020-04-23 NOTE — Progress Notes (Signed)
Patient to CT in stable condition.

## 2020-04-23 NOTE — Progress Notes (Signed)
Brief hospitalist update note.  This is a nonbillable note.  Please see same-day H&P from Dr. Damita Dunnings for full billable details  Briefly, this is an 85 year old female history significant for dementia, hypertension, hypothyroidism, diabetes, recurrent UTI who presented to the ER with several day history of weakness, confusion, intermittent fever.  She was mildly febrile on admission as well as tachycardic.  Remained hemodynamically stable.  Did have a leukocytosis.  Urinalysis concerning for infection.  Started on empiric antibiotics with improvement in mental status.  This morning when I evaluated patient she is resting comfortably in bed.  She is in no visible distress.  She answered all my questions appropriately.  We will continue current course of treatment for UTI with associated delirium.  I updated the patient's daughter Shannon Higgins [phone] via phone  Ralene Muskrat MD

## 2020-04-23 NOTE — Progress Notes (Signed)
PHARMACY NOTE:  ANTICOAGULATION RENAL DOSAGE ADJUSTMENT  Current enoxaparin regimen includes a mismatch between antimicrobial dosage and estimated renal function.  As per policy approved by the Pharmacy & Therapeutics and Medical Executive Committees, the enoxaparin dosage will be adjusted accordingly.  Current anticoagulation dosage:  Enoxaparin 40 mg [place] daily  Indication: VTE prophylaxis  Renal Function:  Estimated Creatinine Clearance: 29.5 mL/min (A) (by C-G formula based on SCr of 1.41 mg/dL (H)).     Anticoagulation dosage has been changed to:  Enoxaparin 30 mg [place] daily   Thank you for allowing pharmacy to be a part of this patient's care.  Dorothe Pea, PharmD, BCPS 04/23/2020 3:33 PM

## 2020-04-23 NOTE — Progress Notes (Signed)
Pt arrived from ER in stable condition. 

## 2020-04-24 LAB — CBC WITH DIFFERENTIAL/PLATELET
Abs Immature Granulocytes: 0.06 10*3/uL (ref 0.00–0.07)
Basophils Absolute: 0 10*3/uL (ref 0.0–0.1)
Basophils Relative: 0 %
Eosinophils Absolute: 0.1 10*3/uL (ref 0.0–0.5)
Eosinophils Relative: 1 %
HCT: 31.6 % — ABNORMAL LOW (ref 36.0–46.0)
Hemoglobin: 10.4 g/dL — ABNORMAL LOW (ref 12.0–15.0)
Immature Granulocytes: 0 %
Lymphocytes Relative: 12 %
Lymphs Abs: 1.6 10*3/uL (ref 0.7–4.0)
MCH: 28.7 pg (ref 26.0–34.0)
MCHC: 32.9 g/dL (ref 30.0–36.0)
MCV: 87.3 fL (ref 80.0–100.0)
Monocytes Absolute: 1.5 10*3/uL — ABNORMAL HIGH (ref 0.1–1.0)
Monocytes Relative: 11 %
Neutro Abs: 10.3 10*3/uL — ABNORMAL HIGH (ref 1.7–7.7)
Neutrophils Relative %: 76 %
Platelets: 265 10*3/uL (ref 150–400)
RBC: 3.62 MIL/uL — ABNORMAL LOW (ref 3.87–5.11)
RDW: 14.6 % (ref 11.5–15.5)
WBC: 13.6 10*3/uL — ABNORMAL HIGH (ref 4.0–10.5)
nRBC: 0 % (ref 0.0–0.2)

## 2020-04-24 LAB — BASIC METABOLIC PANEL
Anion gap: 10 (ref 5–15)
BUN: 10 mg/dL (ref 8–23)
CO2: 27 mmol/L (ref 22–32)
Calcium: 8.7 mg/dL — ABNORMAL LOW (ref 8.9–10.3)
Chloride: 99 mmol/L (ref 98–111)
Creatinine, Ser: 0.82 mg/dL (ref 0.44–1.00)
GFR, Estimated: >60 mL/min (ref >60)
Glucose, Bld: 128 mg/dL — ABNORMAL HIGH (ref 70–99)
Potassium: 2.8 mmol/L — ABNORMAL LOW (ref 3.5–5.1)
Sodium: 136 mmol/L (ref 135–145)

## 2020-04-24 LAB — GLUCOSE, CAPILLARY
Glucose-Capillary: 121 mg/dL — ABNORMAL HIGH (ref 70–99)
Glucose-Capillary: 132 mg/dL — ABNORMAL HIGH (ref 70–99)
Glucose-Capillary: 126 mg/dL — ABNORMAL HIGH (ref 70–99)

## 2020-04-24 MED ORDER — DIPHENHYDRAMINE HCL 12.5 MG/5ML PO ELIX
12.5000 mg | ORAL_SOLUTION | Freq: Once | ORAL | Status: DC
  Filled 2020-04-24: qty 5

## 2020-04-24 MED ORDER — DIPHENHYDRAMINE HCL 25 MG PO CAPS: 25.0000 mg | ORAL_CAPSULE | Freq: Four times a day (QID) | ORAL | Status: DC | PRN

## 2020-04-24 MED ORDER — ENOXAPARIN SODIUM 40 MG/0.4ML ~~LOC~~ SOLN: 40.0000 mg | SUBCUTANEOUS | Status: DC

## 2020-04-24 MED ORDER — AMOXICILLIN-POT CLAVULANATE 875-125 MG PO TABS: 1.0000 | ORAL_TABLET | Freq: Two times a day (BID) | ORAL | 0 refills | Status: AC

## 2020-04-24 MED ORDER — AMLODIPINE BESYLATE 5 MG PO TABS: 5.0000 mg | ORAL_TABLET | Freq: Every day | ORAL | 0 refills | Status: DC

## 2020-04-24 MED ORDER — POTASSIUM CHLORIDE CRYS ER 20 MEQ PO TBCR
40.0000 meq | EXTENDED_RELEASE_TABLET | ORAL | Status: AC
  Administered 2020-04-24 (×2): 40 meq via ORAL
  Filled 2020-04-24 (×2): qty 2

## 2020-04-24 MED ORDER — AMLODIPINE BESYLATE 10 MG PO TABS
10.0000 mg | ORAL_TABLET | Freq: Every day | ORAL | Status: DC
  Administered 2020-04-24: 10 mg via ORAL
  Filled 2020-04-24: qty 1

## 2020-04-24 NOTE — Progress Notes (Signed)
PHARMACIST - PHYSICIAN COMMUNICATION  CONCERNING:  Enoxaparin (Lovenox) for DVT Prophylaxis    RECOMMENDATION: Patient was prescribed enoxaprin 30 mg q24 hours for VTE prophylaxis.   Filed Weights   04/22/20 1848  Weight: 69.9 kg (154 lb)    Body mass index is 26.43 kg/m.  Estimated Creatinine Clearance: 50.8 mL/min (by C-G formula based on SCr of 0.82 mg/dL).   Patient is candidate for enoxaparin 40mg  every 24 hours based on CrCl >26ml/min   DESCRIPTION: Pharmacy has adjusted enoxaparin dose per [facility] policy.  Patient is now receiving enoxaparin 40 mg every 24 hours    Dorothe Pea, PharmD, BCPS Clinical Pharmacist  04/24/2020 10:26 AM

## 2020-04-24 NOTE — Discharge Instructions (Signed)

## 2020-04-24 NOTE — Plan of Care (Signed)

## 2020-04-24 NOTE — Evaluation (Addendum)
Physical Therapy Evaluation Patient Details Name: Shannon Higgins MRN: [id number] DOB: 09-24-1934 Today's Date: 04/24/2020   History of Present Illness  Pt is a 85 y.o. female with medical history significant for Dementia, HTN, hypothyroidism, diabetes with neuropathy, TIA and gout as well as recurrent UTI, who presents to the emergency room with a several day history of weakness, confusion and intermittent fever. Pt admitted for tx of sepsis 2/2 UTI. PMH also significant for: Breast CA, RA  Clinical Impression  Addendum: Pt with low K+ (2.8) but OT reports MD has requested therapy to see pt as pt with K+ supplementation today. PT contacted MD & MD cleared pt for participation in session.   Pt currently requires supervision for transfers, supervision<>CGA for gait with RW. PT educates pt on need to secure/remove throw rugs in home and need for someone to assist her up steps into house when she returns home. Pt c/o "funny feeling" on the L side of her head but denies dizziness. Pt agreeable to HHPT f/u but would "like to wait until this dizziness goes away". PT educates pt on need to use RW for mobility in the home upon d/c. Pt would benefit from ongoing acute PT services to progress gait with LRAD & for balance & endurance training.   Orthostatic vitals during session, all BP taken in RUE: Supine: 141/67 mmHg (MAP 89) Sitting EOB: BP = 151/85 mmHg (MAP 106) Standing EOB at 0: BP = 147/68 mmHg (MAP 97) Supine in bed at end of session: BP = 168/83 mmHg (MAP 115)    Follow Up Recommendations Home health PT;Supervision/Assistance - 24 hour;Supervision for mobility/OOB    Equipment Recommendations  Rolling walker with 5" wheels    Recommendations for Other Services       Precautions / Restrictions Precautions Precautions: Fall Restrictions Weight Bearing Restrictions: No      Mobility  Bed Mobility Overal bed mobility: Independent             General bed mobility comments:  sit<>supine    Transfers Overall transfer level: Needs assistance Equipment used: Rolling walker (2 wheeled) Transfers: Sit to/from Stand Sit to Stand: Supervision         General transfer comment: cuing for safe hand placement when using AD  Ambulation/Gait Ambulation/Gait assistance: Min guard;Supervision Gait Distance (Feet): 100 Feet + 40 ft (bed<>bathroom) Assistive device: Rolling walker (2 wheeled) Gait Pattern/deviations: Decreased stride length Gait velocity: decreased      Stairs            Wheelchair Mobility    Modified Rankin (Stroke Patients Only)       Balance Overall balance assessment: Needs assistance Sitting-balance support: No upper extremity supported;Feet supported Sitting balance-Leahy Scale: Good     Standing balance support: Single extremity supported;During functional activity Standing balance-Leahy Scale: Fair Standing balance comment: static standing with LUE support to check BP with close supervision for standing balance                             Pertinent Vitals/Pain Pain Assessment: Faces Faces Pain Scale: No hurt    Home Living Family/patient expects to be discharged to:: Private residence Living Arrangements: Children Available Help at Discharge: Family (pt's daughter works but initially upon d/c home pt will have 24 hr care) Type of Home: Other(Comment) (townhome) Home Access: Stairs to enter Entrance Stairs-Rails: Right Entrance Stairs-Number of Steps: 3 Home Layout: One level Home Equipment: Walker - 2  wheels      Prior Function Level of Independence: Independent with assistive device(s)         Comments: Pt uses RW in community, PRN in the home too.     Hand Dominance   Dominant Hand: Right    Extremity/Trunk Assessment   Upper Extremity Assessment Upper Extremity Assessment: Overall WFL for tasks assessed    Lower Extremity Assessment Lower Extremity Assessment: Generalized  weakness       Communication   Communication: No difficulties  Cognition Arousal/Alertness: Awake/alert Behavior During Therapy: WFL for tasks assessed/performed Overall Cognitive Status: Within Functional Limits for tasks assessed                                        General Comments General comments (skin integrity, edema, etc.): after transferring back into bed pt reports need to use restroom so exits bed & ambulates in room/bathroom, pt requires cuing to use grab bar vs RW to pull up on for sit>stand from toilet, pt with continent void & performs peri hygiene without assistance, PT provides education re: squaring up to sink to perform hand hygiene vs parking RW to her side    Exercises    Assessment/Plan    PT Assessment Patient needs continued PT services  PT Problem List Decreased strength;Decreased mobility;Decreased balance;Decreased knowledge of precautions       PT Treatment Interventions DME instruction;Therapeutic activities;Gait training;Therapeutic exercise;Patient/family education;Stair training;Balance training;Functional mobility training;Neuromuscular re-education    PT Goals (Current goals can be found in the Care Plan section)  Acute Rehab PT Goals Patient Stated Goal: To return home PT Goal Formulation: With patient Time For Goal Achievement: 05/08/20 Potential to Achieve Goals: Good    Frequency Min 2X/week   Barriers to discharge        Co-evaluation               AM-PAC PT "6 Clicks" Mobility  Outcome Measure Help needed turning from your back to your side while in a flat bed without using bedrails?: None Help needed moving from lying on your back to sitting on the side of a flat bed without using bedrails?: None Help needed moving to and from a bed to a chair (including a wheelchair)?: A Little Help needed standing up from a chair using your arms (e.g., wheelchair or bedside chair)?: None Help needed to walk in hospital  room?: A Little Help needed climbing 3-5 steps with a railing? : A Little 6 Click Score: 21    End of Session Equipment Utilized During Treatment: Gait belt Activity Tolerance: Patient tolerated treatment well Patient left: in bed;with bed alarm set;with family/visitor present;with call bell/phone within reach   PT Visit Diagnosis: Unsteadiness on feet (R26.81);Difficulty in walking, not elsewhere classified (R26.2)    Time: 1610-9604 PT Time Calculation (min) (ACUTE ONLY): 22 min   Charges:   PT Evaluation $PT Eval Low Complexity: 1 Low PT Treatments $Therapeutic Activity: 8-22 mins        Lavone Nian, PT, DPT 04/24/20, 3:49 PM   Waunita Schooner 04/24/2020, 3:44 PM

## 2020-04-24 NOTE — Evaluation (Signed)
Occupational Therapy Evaluation Patient Details Name: Shannon Higgins MRN: [id number] DOB: 16-Nov-1934 Today's Date: 04/24/2020    History of Present Illness Shannon Higgins is a 85 y.o. female with medical history significant for Dementia, HTN, hypothyroidism, diabetes with neuropathy, TIA and gout as well as recurrent UTI, who presents to the emergency room with a several day history of weakness, confusion and intermittent fever.   Clinical Impression   Shannon Higgins was seen for OT evaluation this date. Prior to hospital admission, pt was MOD I mobility using RW in community and Independent c ADLs. Pt lives with daughter and Sol Blazing, family available 24/7. Pt demonstrates near baseline independence to perform ADL and mobility tasks requiring RW + SUPERVISION only for toileting at commode and bathing seated. No skilled OT needs identified. Will sign off. Please re-consult if additional OT needs arise.  Orthostatic Vitals  Sitting: BP 158/73, MAP 104 + dizziness, resolves c rest Standing: BP 120/79, MAP 94 + dizziness, resolves c rest     Follow Up Recommendations  No OT follow up;Supervision/Assistance - 24 hour (Initial 24/7)    Equipment Recommendations  None recommended by OT    Recommendations for Other Services       Precautions / Restrictions Precautions Precautions: Fall Restrictions Weight Bearing Restrictions: No      Mobility Bed Mobility Overal bed mobility: Independent                  Transfers Overall transfer level: Modified independent Equipment used: Rolling walker (2 wheeled)                  Balance Overall balance assessment: Needs assistance Sitting-balance support: No upper extremity supported;Feet supported Sitting balance-Leahy Scale: Normal     Standing balance support: Single extremity supported;During functional activity Standing balance-Leahy Scale: Fair                             ADL either performed or assessed with  clinical judgement   ADL Overall ADL's : Modified independent                                       General ADL Comments: RW + SUPERVISION for toileting and bathing seated                  Pertinent Vitals/Pain Pain Assessment: No/denies pain     Hand Dominance Right   Extremity/Trunk Assessment Upper Extremity Assessment Upper Extremity Assessment: Overall WFL for tasks assessed   Lower Extremity Assessment Lower Extremity Assessment: Overall WFL for tasks assessed       Communication Communication Communication: No difficulties   Cognition Arousal/Alertness: Awake/alert Behavior During Therapy: WFL for tasks assessed/performed Overall Cognitive Status: Within Functional Limits for tasks assessed                                     General Comments  orthostatics    Exercises Exercises: Other exercises Other Exercises Other Exercises: Pt and family educated re: OT role, DME recs, d/c recs, falls prevention, home/routines modifications Other Exercises: LBD, toileting, bathing, sup<>sit, sit<>stand x3, sitting/standing balance/tolerance   Shoulder Instructions      Home Living Family/patient expects to be discharged to:: Private residence Living Arrangements: Children Available Help at Discharge: Family;Available 24  hours/day (daughter and SiL) Type of Home: Other(Comment) (townhome) Home Access: Stairs to enter [employer] of Steps: 3 Entrance Stairs-Rails: Right Home Layout: One level     Bathroom Shower/Tub: Walk-in shower         Home Equipment: Shower seat          Prior Functioning/Environment Level of Independence: Independent with assistive device(s)                 OT Problem List: Decreased activity tolerance;Impaired balance (sitting and/or standing);Decreased safety awareness      OT Treatment/Interventions:      OT Goals(Current goals can be found in the care plan section) Acute  Rehab OT Goals Patient Stated Goal: To return home OT Goal Formulation: With patient/family Time For Goal Achievement: 05/08/20 Potential to Achieve Goals: Good   AM-PAC OT "6 Clicks" Daily Activity     Outcome Measure Help from another person eating meals?: None Help from another person taking care of personal grooming?: None Help from another person toileting, which includes using toliet, bedpan, or urinal?: A Little Help from another person bathing (including washing, rinsing, drying)?: A Little Help from another person to put on and taking off regular upper body clothing?: None Help from another person to put on and taking off regular lower body clothing?: None 6 Click Score: 22   End of Session Equipment Utilized During Treatment: Rolling walker Nurse Communication: Mobility status  Activity Tolerance: Patient tolerated treatment well Patient left: in bed;with call bell/phone within reach;with family/visitor present  OT Visit Diagnosis: Other abnormalities of gait and mobility (R26.89)                Time: 1405-1430 OT Time Calculation (min): 25 min Charges:  OT General Charges $OT Visit: 1 Visit OT Evaluation $OT Eval Low Complexity: 1 Low OT Treatments $Self Care/Home Management : 23-37 mins  Dessie Coma, M.S. OTR/L  04/24/20, 3:16 PM  ascom [phone]

## 2020-04-24 NOTE — Progress Notes (Signed)
OT Cancellation Note  Patient Details Name: Shannon Higgins MRN: [id number] DOB: [birth year]   Cancelled Treatment:    Reason Eval/Treat Not Completed: Medical issues which prohibited therapy. Chart reviewed - pt noted to have K+ critically low at 2.8; contraindicated for exertional activity at this time. Will continue to follow and initiate services as pt medically appropriate to participate in therapy.   Dessie Coma, M.S. OTR/L  04/24/20, 9:05 AM  ascom [phone]

## 2020-04-24 NOTE — Discharge Summary (Signed)
Physician Discharge Summary  Shannon Higgins OEV:[id number] DOB: [birth year] DOA: 04/23/2020  PCP: Pleas Koch, NP  Admit date: 04/23/2020 Discharge date: 04/24/2020  Admitted From: Home Disposition:  Home with home health  Recommendations for Outpatient Follow-up:  1. Follow up with PCP in 1-2 weeks 2.   Home Health:Yes Equipment/Devices:None Discharge Condition:Stable CODE STATUS:Full Diet recommendation: Heart Healthy  Brief/Interim Summary:  85 year old female history significant for dementia, hypertension, hypothyroidism, diabetes, recurrent UTI who presented to the ER with several day history of weakness, confusion, intermittent fever.  She was mildly febrile on admission as well as tachycardic.  Remained hemodynamically stable.  Did have a leukocytosis.  Urinalysis concerning for infection.  Started on empiric antibiotics with improvement in mental status.  This morning when I evaluated patient she is resting comfortably in bed.  She is in no visible distress.  She answered all my questions appropriately.  12/24: Patient seen and examined on the day of discharge.  Stable, no distress.  Daughter at bedside.  Patient alert and oriented.  Is had some labile blood pressures but stable.  Stable for discharge home at this time.  Will treat empirically with 5-day course of Augmentin for E. coli urinary tract infection.  Mental status at baseline at time of discharge.  Had a long discussion with patient and daughter regarding medication control.  They will follow up with their primary care physician within a week of discharge.  Notify the patient that she is on high dose of gabapentin in addition to likely needing some blood pressure control.  I will add amlodipine 5 mg daily to her blood pressure regimen.  No further changes in home medication regimen. Discharge Diagnoses:  Active Problems:   Sepsis secondary to UTI ([place])   Hypothyroidism   Dementia arising in the senium and  presenium ([place])   Type 2 diabetes mellitus with peripheral neuropathy (H[facility])   Recurrent UTI   History of TIA (transient ischemic attack)   Gout   Acute metabolic encephalopathy   Sepsis secondary to UTI ([place]), resolved   Recurrent UTI -Patient with history of recurrent UTI presenting with confusion, fever, febrile and tachycardic in ER with leukocytosis of 16,000.  Lactic acid 1.6 -Urine culture positive for E. Coli -Sensitivities pending at time of discharge -We will transition to Augmentin and plan for 5-day total course given recurrent UTIs -Home health ordered -Stable for discharge     Hypothyroidism -Continue home levothyroxine    Dementia arising in the senium and presenium ([place]) -Continue Aricept    Type 2 diabetes mellitus with peripheral neuropathy ([place]) Can resume home regimen.  Patient on high-dose gabapentin for peripheral neuropathy.  Discussed the potential risk with the patient and daughter at bedside.  They agreed to speak with your primary care physician    History of TIA (transient ischemic attack) -Continue aspirin and rosuvastatin  Hypertension Patient without a known diagnosis of hypertension however significantly elevated blood pressures noted during admission.  Will add amlodipine 5 mg daily to home medication regimen.  Patient follow-up with PCP within 1 week.  Discharge Instructions  Discharge Instructions    Diet - low sodium heart healthy   Complete by: As directed    Increase activity slowly   Complete by: As directed      Allergies as of 04/24/2020   No Known Allergies     Medication List    TAKE these medications   Accu-Chek FastClix Lancets Misc Use as instructed to test blood sugar daily  Accu-Chek Guide test strip Generic drug: glucose blood Use as instructed to blood sugar daily   amLODipine 5 MG tablet Commonly known as: NORVASC Take 1 tablet (5 mg total) by mouth daily. Start taking on: April 25, 2020    amoxicillin-clavulanate 875-125 MG tablet Commonly known as: Augmentin Take 1 tablet by mouth 2 (two) times daily for 3 days.   aspirin EC 325 MG tablet Take 325 mg by mouth daily.   blood glucose meter kit and supplies Dispense based on patient and insurance preference. Use up to 2 times daily as directed. (FOR ICD-10 E10.9, E11.9).   donepezil 10 MG tablet Commonly known as: ARICEPT Take 1 tablet daily What changed:   how much to take  how to take this  when to take this   dorzolamide-timolol 22.3-6.8 MG/ML ophthalmic solution Commonly known as: COSOPT Place 1 drop into both eyes 2 (two) times daily.   DULoxetine 60 MG capsule Commonly known as: CYMBALTA Take 60 mg by mouth daily.   fluticasone 50 MCG/ACT nasal spray Commonly known as: FLONASE Place 1 spray into both nostrils daily.   gabapentin 800 MG tablet Commonly known as: NEURONTIN TAKE 1 TABLET BY MOUTH THREE TIMES A DAY What changed:   how much to take  how to take this  when to take this   latanoprost 0.005 % ophthalmic solution Commonly known as: XALATAN Place 1 drop into both eyes at bedtime.   levothyroxine 112 MCG tablet Commonly known as: SYNTHROID Take 1 tablet by mouth every morning on an empty stomach with water only.  No food or other medications for 30 minutes. What changed:   how much to take  how to take this  when to take this   meclizine 25 MG tablet Commonly known as: ANTIVERT Take 1 tablet (25 mg total) by mouth 2 (two) times daily as needed for dizziness.   meloxicam 15 MG tablet Commonly known as: MOBIC Take 1 tablet (15 mg total) by mouth daily as needed for pain.   Nurtec 75 MG Tbdp Generic drug: Rimegepant Sulfate Take 1 tablet by mouth as needed. Take 1 tablet as needed for migraine. Do not take more than 1 in 24 hours.   omeprazole 20 MG capsule Commonly known as: PRILOSEC TAKE 1 CAPSULE BY MOUTH TWICE DAILY FOR HEARTBURN. What changed:   how much to  take  how to take this  when to take this   pilocarpine 2 % ophthalmic solution Commonly known as: PILOCAR Place 1 drop into both eyes 4 (four) times daily.   propranolol 80 MG tablet Commonly known as: INDERAL TAKE 1 TABLET BY MOUTH ONCE DAILY FOR BLOOD PRESSURE. What changed: See the new instructions.   Rhopressa 0.02 % Soln Generic drug: Netarsudil Dimesylate Place 1 drop into both eyes daily at 6 (six) AM.   rosuvastatin 10 MG tablet Commonly known as: CRESTOR TAKE 1 TABLET BY MOUTH ONCE DAILY FOR CHOLESTEROL. What changed: See the new instructions.   traZODone 100 MG tablet Commonly known as: DESYREL Take two tablets at bedtime as needed for sleep What changed:   how much to take  how to take this  when to take this  reasons to take this            Durable Medical Equipment  (From admission, onward)         Start     Ordered   04/24/20 1610  For home use only DME Walker  Once  Question:  Patient needs a walker to treat with the following condition  Answer:  Weakness   04/24/20 1609          Follow-up Information    Pleas Koch, NP. Schedule an appointment as soon as possible for a visit in 1 week.   Specialty: Internal Medicine Why: Patient to make own follow up appt Contact information: [place] [address] [phone]              No Known Allergies  Consultations: None  Procedures/Studies: CT HEAD WO CONTRAST  Result Date: 04/23/2020 CLINICAL DATA:  Mental status change EXAM: CT HEAD WITHOUT CONTRAST TECHNIQUE: Contiguous axial images were obtained from the base of the skull through the vertex without intravenous contrast. COMPARISON:  09/13/2018 FINDINGS: Brain: There is no acute intracranial hemorrhage, mass effect, or edema. Gray-white differentiation is preserved. There is no extra-axial fluid collection. Prominence of the ventricles and sulci reflects mild generalized parenchymal volume loss.  Patchy and confluent areas of hypoattenuation in the supratentorial white matter are nonspecific but probably reflect similar moderate chronic microvascular ischemic changes. Small chronic infarct of the posterior right putamen. Vascular: There is atherosclerotic calcification at the skull base. Skull: Calvarium is unremarkable. Sinuses/Orbits: Paranasal sinus mucosal thickening. Bilateral lens replacements. Other: None. IMPRESSION: No acute intracranial hemorrhage, mass effect, or evidence of acute infarction. Stable chronic microvascular ischemic changes. Electronically Signed   By: Macy Mis M.D.   On: 04/23/2020 17:32   CT ABDOMEN PELVIS W CONTRAST  Result Date: 04/23/2020 CLINICAL DATA:  Fever, fatigue and weakness x2 weeks. EXAM: CT ABDOMEN AND PELVIS WITH CONTRAST TECHNIQUE: Multidetector CT imaging of the abdomen and pelvis was performed using the standard protocol following bolus administration of intravenous contrast. CONTRAST:  111m OMNIPAQUE IOHEXOL 300 MG/ML  SOLN COMPARISON:  None. FINDINGS: Lower chest: No acute abnormality. Hepatobiliary: No focal liver abnormality is seen. Status post cholecystectomy. No biliary dilatation. Pancreas: Unremarkable. No pancreatic ductal dilatation or surrounding inflammatory changes. Spleen: Normal in size without focal abnormality. Adrenals/Urinary Tract: Adrenal glands are unremarkable. The kidneys are normal in size. A 1.4 cm x 0.9 cm simple cyst is seen along the posteromedial aspect of the mid left kidney. There is marked severity right-sided hydronephrosis and hydroureter with mild right perinephric inflammatory fat stranding and delayed right renal cortical enhancement. No obstructing renal calculi are identified. The dilated right ureter extends to the level of a large pelvic cyst (see below), best seen on sagittal reformatted images 52 through 55, CT series number 6). The urinary bladder is empty and subsequently limited in evaluation.  Stomach/Bowel: There is a moderate-sized hiatal hernia. Appendix appears normal. No evidence of bowel wall thickening, distention, or inflammatory changes. Vascular/Lymphatic: Aortic atherosclerosis. No enlarged abdominal or pelvic lymph nodes. Reproductive: Status post hysterectomy. Other: A very large, approximately 13.5 cm x 11.8 cm x 15.2 cm simple, cystic appearing area is seen within the pelvis along the midline. This extends from the lower abdomen to the region just above the urinary bladder. Musculoskeletal: Marked severity multilevel degenerative changes seen throughout the lumbar spine. IMPRESSION: 1. Partial obstruction of the right kidney secondary to the presence of a very large, simple pelvic cyst, as described above. While this cyst may be ovarian in origin, correlation with pelvic ultrasound is recommended. 2. Moderate-sized hiatal hernia. 3. Evidence of prior cholecystectomy and hysterectomy. 4. Aortic atherosclerosis. Aortic Atherosclerosis (ICD10-I70.0). Electronically Signed   By: TVirgina NorfolkM.D.   On: 04/23/2020 02:07  DG Chest Portable 1 View  Result Date: 04/23/2020 CLINICAL DATA:  Fever, fatigue and weakness for 2 weeks EXAM: PORTABLE CHEST 1 VIEW COMPARISON:  Radiograph 01/20/2020 FINDINGS: Chronically coarsened interstitial and bronchitic features are similar to comparison exam. Some additional scarring is again seen in the lingula. No new consolidative opacity. No pneumothorax or effusion. The aorta is calcified. Small hiatal hernia. The remaining cardiomediastinal contours are unremarkable. Bilateral shoulder arthroplasties. Postsurgical changes in the left axillary region and left breast. Telemetry leads overlie the chest. IMPRESSION: 1. Chronically coarsened interstitial and bronchitic features, similar to comparison exam. 2. No acute cardiopulmonary abnormality. 3. Small hiatal hernia. Electronically Signed   By: Lovena Le M.D.   On: 04/23/2020 00:46    (Echo,  Carotid, EGD, Colonoscopy, ERCP)    Subjective: Patient seen and examined on the day of discharge.  Stable, no distress.  Daughter at bedside.  Discharge recommendations and instructions explained in detail.  All questions answered.  Discharge Exam: Vitals:   04/24/20 1150 04/24/20 1557  BP: (!) 185/63 (!) 139/96  Pulse:  63  Resp:  18  Temp:  97.8 F (36.6 C)  SpO2:  98%   Vitals:   04/24/20 0711 04/24/20 1111 04/24/20 1150 04/24/20 1557  BP: (!) 152/62 (!) 217/93 (!) 185/63 (!) 139/96  Pulse: 63   63  Resp: 18   18  Temp: 98.3 F (36.8 C)   97.8 F (36.6 C)  TempSrc:      SpO2: 95%   98%  Weight:      Height:        General: Pt is alert, awake, not in acute distress Cardiovascular: RRR, S1/S2 +, no rubs, no gallops Respiratory: CTA bilaterally, no wheezing, no rhonchi Abdominal: Soft, NT, ND, bowel sounds + Extremities: no edema, no cyanosis    The results of significant diagnostics from this hospitalization (including imaging, microbiology, ancillary and laboratory) are listed below for reference.     Microbiology: Recent Results (from the past 240 hour(s))  Resp Panel by RT-PCR (Flu A&B, Covid) Nasopharyngeal Swab     Status: None   Collection Time: 04/22/20  9:43 PM   Specimen: Nasopharyngeal Swab; Nasopharyngeal(NP) swabs in vial transport medium  Result Value Ref Range Status   SARS Coronavirus 2 by RT PCR NEGATIVE NEGATIVE Final    Comment: (NOTE) SARS-CoV-2 target nucleic acids are NOT DETECTED.  The SARS-CoV-2 RNA is generally detectable in upper respiratory specimens during the acute phase of infection. The lowest concentration of SARS-CoV-2 viral copies this assay can detect is 138 copies/mL. A negative result does not preclude SARS-Cov-2 infection and should not be used as the sole basis for treatment or other patient management decisions. A negative result may occur with  improper specimen collection/handling, submission of specimen other than  nasopharyngeal swab, presence of viral mutation(s) within the areas targeted by this assay, and inadequate number of viral copies(<138 copies/mL). A negative result must be combined with clinical observations, patient history, and epidemiological information. The expected result is Negative.  Fact Sheet for Patients:  EntrepreneurPulse.com.au  Fact Sheet for Healthcare Providers:  IncredibleEmployment.be  This test is no t yet approved or cleared by the [place] FDA and  has been authorized for detection and/or diagnosis of SARS-CoV-2 by FDA under an Emergency Use Authorization (EUA). This EUA will remain  in effect (meaning this test can be used) for the duration of the COVID-19 declaration under Section 564(b)(1) of the Act, 21 U.S.C.section 360bbb-3(b)(1), unless the authorization is  terminated  or revoked sooner.       Influenza A by PCR NEGATIVE NEGATIVE Final   Influenza B by PCR NEGATIVE NEGATIVE Final    Comment: (NOTE) The Xpert Xpress SARS-CoV-2/FLU/RSV plus assay is intended as an aid in the diagnosis of influenza from Nasopharyngeal swab specimens and should not be used as a sole basis for treatment. Nasal washings and aspirates are unacceptable for Xpert Xpress SARS-CoV-2/FLU/RSV testing.  Fact Sheet for Patients: EntrepreneurPulse.com.au  Fact Sheet for Healthcare Providers: IncredibleEmployment.be  This test is not yet approved or cleared by the [place] FDA and has been authorized for detection and/or diagnosis of SARS-CoV-2 by FDA under an Emergency Use Authorization (EUA). This EUA will remain in effect (meaning this test can be used) for the duration of the COVID-19 declaration under Section 564(b)(1) of the Act, 21 U.S.C. section 360bbb-3(b)(1), unless the authorization is terminated or revoked.  Performed at [facility], [address] [place], [place]  [address]   Urine Culture     Status: Abnormal (Preliminary result)   Collection Time: 04/22/20  9:43 PM   Specimen: Urine, Random  Result Value Ref Range Status   Specimen Description   Final    URINE, RANDOM Performed at [facility], [address] [place], [place] [address]    Special Requests   Final    NONE Performed at [facility], [address] [place], [place] [address]    Culture (A)  Final    >=100,000 COLONIES/mL ESCHERICHIA COLI SUSCEPTIBILITIES TO FOLLOW Performed at [facility], [place] [address] [place], [place] [address]    Report Status PENDING  Incomplete  Blood culture (routine x 2)     Status: None (Preliminary result)   Collection Time: 04/23/20 12:57 AM   Specimen: BLOOD  Result Value Ref Range Status   Specimen Description BLOOD LEFT ASSIST CONTROL  Final   Special Requests   Final    BOTTLES DRAWN AEROBIC AND ANAEROBIC Blood Culture adequate volume   Culture   Final    NO GROWTH 1 DAY Performed at [facility], [address], [place], [place] [address]    Report Status PENDING  Incomplete  Blood culture (routine x 2)     Status: None (Preliminary result)   Collection Time: 04/23/20 12:57 AM   Specimen: BLOOD  Result Value Ref Range Status   Specimen Description BLOOD BLOOD LEFT FOREARM  Final   Special Requests   Final    BOTTLES DRAWN AEROBIC AND ANAEROBIC Blood Culture adequate volume   Culture   Final    NO GROWTH 1 DAY Performed at [facility], [address], [place], [place] [address]    Report Status PENDING  Incomplete     Labs: BNP (last 3 results) No results for input(s): BNP in the last 8760 hours. Basic Metabolic Panel: Recent Labs  Lab 04/22/20 1850 04/23/20 0329 04/24/20 0755  NA 137 136 136  K 3.4* 3.5 2.8*  CL 102 98 99  CO2 25 27 27   GLUCOSE 165* 153* 128*  BUN 15 18 10   CREATININE 0.88 1.41*  1.41* 0.82  CALCIUM 9.4 9.1 8.7*  MG 1.9  --   --    Liver Function  Tests: Recent Labs  Lab 04/22/20 1850  AST 21  ALT 11  ALKPHOS 75  BILITOT 0.6  PROT 7.6  ALBUMIN 3.7   No results for input(s): LIPASE, AMYLASE in the last 168 hours. No results  for input(s): AMMONIA in the last 168 hours. CBC: Recent Labs  Lab 04/22/20 1850 04/23/20 0329 04/24/20 0755  WBC 16.3* 14.2* 13.6*  NEUTROABS  --   --  10.3*  HGB 12.5 14.3 10.4*  HCT 38.8 44.8 31.6*  MCV 87.6 88.5 87.3  PLT 282 258 265   Cardiac Enzymes: No results for input(s): CKTOTAL, CKMB, CKMBINDEX, TROPONINI in the last 168 hours. BNP: Invalid input(s): POCBNP CBG: Recent Labs  Lab 04/23/20 1204 04/23/20 1627 04/23/20 2116 04/24/20 0724 04/24/20 1208  GLUCAP 133* 122* 133* 121* 132*   D-Dimer No results for input(s): DDIMER in the last 72 hours. Hgb A1c Recent Labs    04/23/20 0329  HGBA1C 6.1*   Lipid Profile No results for input(s): CHOL, HDL, LDLCALC, TRIG, CHOLHDL, LDLDIRECT in the last 72 hours. Thyroid function studies No results for input(s): TSH, T4TOTAL, T3FREE, THYROIDAB in the last 72 hours.  Invalid input(s): FREET3 Anemia work up No results for input(s): VITAMINB12, FOLATE, FERRITIN, TIBC, IRON, RETICCTPCT in the last 72 hours. Urinalysis    Component Value Date/Time   COLORURINE YELLOW (A) 04/22/2020 2143   APPEARANCEUR CLOUDY (A) 04/22/2020 2143   APPEARANCEUR Clear 07/02/2018 1519   LABSPEC 1.011 04/22/2020 2143   PHURINE 6.0 04/22/2020 2143   GLUCOSEU NEGATIVE 04/22/2020 2143   HGBUR SMALL (A) 04/22/2020 2143   BILIRUBINUR NEGATIVE 04/22/2020 2143   BILIRUBINUR Negative 02/06/2020 1113   BILIRUBINUR Negative 07/02/2018 [place] 04/22/2020 2143   PROTEINUR 100 (A) 04/22/2020 2143   UROBILINOGEN 0.2 02/06/2020 1113   UROBILINOGEN 0.2 04/26/2012 1547   NITRITE NEGATIVE 04/22/2020 2143   LEUKOCYTESUR LARGE (A) 04/22/2020 2143   Sepsis Labs Invalid input(s): PROCALCITONIN,  WBC,  LACTICIDVEN Microbiology Recent Results (from  the past 240 hour(s))  Resp Panel by RT-PCR (Flu A&B, Covid) Nasopharyngeal Swab     Status: None   Collection Time: 04/22/20  9:43 PM   Specimen: Nasopharyngeal Swab; Nasopharyngeal(NP) swabs in vial transport medium  Result Value Ref Range Status   SARS Coronavirus 2 by RT PCR NEGATIVE NEGATIVE Final    Comment: (NOTE) SARS-CoV-2 target nucleic acids are NOT DETECTED.  The SARS-CoV-2 RNA is generally detectable in upper respiratory specimens during the acute phase of infection. The lowest concentration of SARS-CoV-2 viral copies this assay can detect is 138 copies/mL. A negative result does not preclude SARS-Cov-2 infection and should not be used as the sole basis for treatment or other patient management decisions. A negative result may occur with  improper specimen collection/handling, submission of specimen other than nasopharyngeal swab, presence of viral mutation(s) within the areas targeted by this assay, and inadequate number of viral copies(<138 copies/mL). A negative result must be combined with clinical observations, patient history, and epidemiological information. The expected result is Negative.  Fact Sheet for Patients:  EntrepreneurPulse.com.au  Fact Sheet for Healthcare Providers:  IncredibleEmployment.be  This test is no t yet approved or cleared by the [place] FDA and  has been authorized for detection and/or diagnosis of SARS-CoV-2 by FDA under an Emergency Use Authorization (EUA). This EUA will remain  in effect (meaning this test can be used) for the duration of the COVID-19 declaration under Section 564(b)(1) of the Act, 21 U.S.C.section 360bbb-3(b)(1), unless the authorization is terminated  or revoked sooner.       Influenza A by PCR NEGATIVE NEGATIVE Final   Influenza B by PCR NEGATIVE NEGATIVE Final    Comment: (NOTE) The Xpert Xpress SARS-CoV-2/FLU/RSV plus assay is  intended as an aid in the diagnosis of  influenza from Nasopharyngeal swab specimens and should not be used as a sole basis for treatment. Nasal washings and aspirates are unacceptable for Xpert Xpress SARS-CoV-2/FLU/RSV testing.  Fact Sheet for Patients: EntrepreneurPulse.com.au  Fact Sheet for Healthcare Providers: IncredibleEmployment.be  This test is not yet approved or cleared by the [place] FDA and has been authorized for detection and/or diagnosis of SARS-CoV-2 by FDA under an Emergency Use Authorization (EUA). This EUA will remain in effect (meaning this test can be used) for the duration of the COVID-19 declaration under Section 564(b)(1) of the Act, 21 U.S.C. section 360bbb-3(b)(1), unless the authorization is terminated or revoked.  Performed at [facility], [address] [place], [place] [address]   Urine Culture     Status: Abnormal (Preliminary result)   Collection Time: 04/22/20  9:43 PM   Specimen: Urine, Random  Result Value Ref Range Status   Specimen Description   Final    URINE, RANDOM Performed at [facility], [address] [place] [address], [place], [place] [address]    Special Requests   Final    NONE Performed at [facility], [address] [place], [place] [address]    Culture (A)  Final    >=100,000 COLONIES/mL ESCHERICHIA COLI SUSCEPTIBILITIES TO FOLLOW Performed at [facility], [place] [address] [place], [place] [address]    Report Status PENDING  Incomplete  Blood culture (routine x 2)     Status: None (Preliminary result)   Collection Time: 04/23/20 12:57 AM   Specimen: BLOOD  Result Value Ref Range Status   Specimen Description BLOOD LEFT ASSIST CONTROL  Final   Special Requests   Final    BOTTLES DRAWN AEROBIC AND ANAEROBIC Blood Culture adequate volume   Culture   Final    NO GROWTH 1 DAY Performed at [facility], [address] [place], [place] [address]    Report Status PENDING  Incomplete   Blood culture (routine x 2)     Status: None (Preliminary result)   Collection Time: 04/23/20 12:57 AM   Specimen: BLOOD  Result Value Ref Range Status   Specimen Description BLOOD BLOOD LEFT FOREARM  Final   Special Requests   Final    BOTTLES DRAWN AEROBIC AND ANAEROBIC Blood Culture adequate volume   Culture   Final    NO GROWTH 1 DAY Performed at [facility], [address], [place], [place] [address]    Report Status PENDING  Incomplete     Time coordinating discharge: Over 30 minutes  SIGNED:   Sidney Ace, MD  [facility] 04/24/2020, 4:10 PM Pager   If 7PM-7AM, please contact night-coverage

## 2020-04-25 LAB — URINE CULTURE: Culture: >=100000 — AB

## 2020-04-27 ENCOUNTER — Telehealth: Payer: Self-pay

## 2020-04-27 NOTE — Telephone Encounter (Signed)
Transition Care Management Follow-up Telephone Call  Date of discharge and from where: 04/24/2020, [facility]  How have you been since you were released from the hospital? Patient states that she is feeling much better.   Any questions or concerns? No  Items Reviewed:  Did the pt receive and understand the discharge instructions provided? Yes   Medications obtained and verified? Yes   Other? No   Any new allergies since your discharge? No   Dietary orders reviewed? Yes  Do you have support at home? Yes   Home Care and Equipment/Supplies: Were home health services ordered? not applicable If so, what is the name of the agency? N/A  Has the agency set up a time to come to the patient's home? not applicable Were any new equipment or medical supplies ordered?  No What is the name of the medical supply agency? N/A Were you able to get the supplies/equipment? not applicable Do you have any questions related to the use of the equipment or supplies? No  Functional Questionnaire: (I = Independent and D = Dependent) ADLs: I  Bathing/Dressing- I  Meal Prep- I  Eating- I  Maintaining continence- I  Transferring/Ambulation- I  Managing Meds- I  Follow up appointments reviewed:   PCP Hospital f/u appt confirmed? Yes  Scheduled to see Alma Friendly, NP on 05/06/2019 @ 3 pm.  [facility] f/u appt confirmed? N/A   Are transportation arrangements needed? No   If their condition worsens, is the pt aware to call PCP or go to the Emergency Dept.? Yes  Was the patient provided with contact information for the PCP's office or ED? Yes  Was to pt encouraged to call back with questions or concerns? Yes

## 2020-04-27 NOTE — Telephone Encounter (Signed)
Transition Care Management Unsuccessful Follow-up Telephone Call  Date of discharge and from where:  04/24/2020, [facility]  Attempts:  2nd Attempt  Reason for unsuccessful TCM follow-up call:  Unable to leave message

## 2020-04-27 NOTE — Telephone Encounter (Signed)
Transition Care Management Unsuccessful Follow-up Telephone Call  Date of discharge and from where:  04/24/2020, [facility]  Attempts:  1st Attempt  Reason for unsuccessful TCM follow-up call:  Unable to leave message

## 2020-04-27 NOTE — Telephone Encounter (Signed)
Will discuss at upcoming appt.

## 2020-04-28 LAB — CULTURE, BLOOD (ROUTINE X 2)
Culture: NO GROWTH
Culture: NO GROWTH
Special Requests: ADEQUATE
Special Requests: ADEQUATE

## 2020-05-02 DIAGNOSIS — C569 Malignant neoplasm of unspecified ovary: Secondary | ICD-10-CM

## 2020-05-02 HISTORY — DX: Malignant neoplasm of unspecified ovary: C56.9

## 2020-05-05 ENCOUNTER — Other Ambulatory Visit: Payer: Self-pay

## 2020-05-05 ENCOUNTER — Encounter: Payer: Self-pay | Admitting: Primary Care

## 2020-05-05 ENCOUNTER — Ambulatory Visit (INDEPENDENT_AMBULATORY_CARE_PROVIDER_SITE_OTHER): Payer: Medicare HMO | Admitting: Primary Care

## 2020-05-05 VITALS — BP 150/88 | HR 76 | Temp 98.3°F | Ht 64.0 in | Wt 158.0 lb

## 2020-05-05 DIAGNOSIS — K219 Gastro-esophageal reflux disease without esophagitis: Secondary | ICD-10-CM | POA: Diagnosis not present

## 2020-05-05 DIAGNOSIS — Z23 Encounter for immunization: Secondary | ICD-10-CM | POA: Diagnosis not present

## 2020-05-05 DIAGNOSIS — F0390 Unspecified dementia without behavioral disturbance: Secondary | ICD-10-CM | POA: Diagnosis not present

## 2020-05-05 DIAGNOSIS — Z853 Personal history of malignant neoplasm of breast: Secondary | ICD-10-CM

## 2020-05-05 DIAGNOSIS — G8929 Other chronic pain: Secondary | ICD-10-CM

## 2020-05-05 DIAGNOSIS — R519 Headache, unspecified: Secondary | ICD-10-CM | POA: Diagnosis not present

## 2020-05-05 DIAGNOSIS — M545 Low back pain, unspecified: Secondary | ICD-10-CM

## 2020-05-05 DIAGNOSIS — M1A9XX0 Chronic gout, unspecified, without tophus (tophi): Secondary | ICD-10-CM

## 2020-05-05 DIAGNOSIS — A419 Sepsis, unspecified organism: Secondary | ICD-10-CM

## 2020-05-05 DIAGNOSIS — Z1231 Encounter for screening mammogram for malignant neoplasm of breast: Secondary | ICD-10-CM | POA: Diagnosis not present

## 2020-05-05 DIAGNOSIS — E039 Hypothyroidism, unspecified: Secondary | ICD-10-CM | POA: Diagnosis not present

## 2020-05-05 DIAGNOSIS — I10 Essential (primary) hypertension: Secondary | ICD-10-CM

## 2020-05-05 DIAGNOSIS — E785 Hyperlipidemia, unspecified: Secondary | ICD-10-CM

## 2020-05-05 DIAGNOSIS — F330 Major depressive disorder, recurrent, mild: Secondary | ICD-10-CM

## 2020-05-05 DIAGNOSIS — G4700 Insomnia, unspecified: Secondary | ICD-10-CM

## 2020-05-05 DIAGNOSIS — E119 Type 2 diabetes mellitus without complications: Secondary | ICD-10-CM

## 2020-05-05 DIAGNOSIS — N390 Urinary tract infection, site not specified: Secondary | ICD-10-CM

## 2020-05-05 NOTE — Assessment & Plan Note (Signed)
Doing well on duloxetine 60 mg, does not appear to be taking gabapentin as last refill was in April 2021 for 51-month supply.

## 2020-05-05 NOTE — Assessment & Plan Note (Signed)
Doing well on omeprazole 20 mg, continue same. 

## 2020-05-05 NOTE — Assessment & Plan Note (Signed)
Well-controlled with recent A1c of 6.1. Continue off of medications. Continue to monitor.

## 2020-05-05 NOTE — Assessment & Plan Note (Signed)
Compliant to annual mammogram screenings, orders placed for upcoming mammogram.

## 2020-05-05 NOTE — Assessment & Plan Note (Signed)
Doing well on duloxetine 60 mg and trazodone 200 mg at bedtime.  Continue same.

## 2020-05-05 NOTE — Assessment & Plan Note (Signed)
Following with neurology, undergoing Botox injections.  Continue same.

## 2020-05-05 NOTE — Assessment & Plan Note (Signed)
3 culture positive UTIs in 2021, likely more that have not been accounted for.  Recent UTI with sepsis leading to admission and hospital stay.  We will consult with urology regarding recurrent UTI, especially in the setting of recent sepsis.  Need to be cautious with topical estrogen given her history of breast cancer.  Consider daily trimethoprim.

## 2020-05-05 NOTE — Assessment & Plan Note (Signed)
Recent hospitalization for urosepsis, improved significantly with treatment.  Long discussion today about UTI prevention, we discussed my hesitancy with topical estrogen given her history of breast cancer.  Consider trimethoprim daily, but will consult with urology first.

## 2020-05-05 NOTE — Assessment & Plan Note (Signed)
Seems to be doing well on Tapazole 10 mg nightly, continue same.  Following with neurology.

## 2020-05-05 NOTE — Assessment & Plan Note (Addendum)
She seems to be taking levothyroxine correctly, but has been taking 125 mcg daily rather than 112 mcg which is what is prescribed.   Repeat TSH pending.

## 2020-05-05 NOTE — Patient Instructions (Addendum)
Stop by the lab prior to leaving today. I will notify you of your results once received.   Monitor your blood pressure, have [name] send me some readings in one week. Your blood pressure should be less than 150 on top and less than 90 on bottom.  I will be in touch once I hear back from my Urology friend.   It was a pleasure to see you today!     Influenza (Flu) Vaccine (Inactivated or Recombinant): What You Need to Know 1. Why get vaccinated? Influenza vaccine can prevent influenza (flu). Flu is a contagious disease that spreads around the [place] every year, usually between October and May. Anyone can get the flu, but it is more dangerous for some people. Infants and young children, people 27 years of age and older, pregnant women, and people with certain health conditions or a weakened immune system are at greatest risk of flu complications. Pneumonia, bronchitis, sinus infections and ear infections are examples of flu-related complications. If you have a medical condition, such as heart disease, cancer or diabetes, flu can make it worse. Flu can cause fever and chills, sore throat, muscle aches, fatigue, cough, headache, and runny or stuffy nose. Some people may have vomiting and diarrhea, though this is more common in children than adults. Each year thousands of people in the [place] die from flu, and many more are hospitalized. Flu vaccine prevents millions of illnesses and flu-related visits to the doctor each year. 2. Influenza vaccine CDC recommends everyone 90 months of age and older get vaccinated every flu season. Children 6 months through 63 years of age may need 2 doses during a single flu season. Everyone else needs only 1 dose each flu season. It takes about 2 weeks for protection to develop after vaccination. There are many flu viruses, and they are always changing. Each year a new flu vaccine is made to protect against three or four viruses that are likely to cause  disease in the upcoming flu season. Even when the vaccine doesn't exactly match these viruses, it may still provide some protection. Influenza vaccine does not cause flu. Influenza vaccine may be given at the same time as other vaccines. 3. Talk with your health care provider Tell your vaccine provider if the person getting the vaccine:  Has had an allergic reaction after a previous dose of influenza vaccine, or has any severe, life-threatening allergies.  Has ever had Guillain-Barr Syndrome (also called GBS). In some cases, your health care provider may decide to postpone influenza vaccination to a future visit. People with minor illnesses, such as a cold, may be vaccinated. People who are moderately or severely ill should usually wait until they recover before getting influenza vaccine. Your health care provider can give you more information. 4. Risks of a vaccine reaction  Soreness, redness, and swelling where shot is given, fever, muscle aches, and headache can happen after influenza vaccine.  There may be a very small increased risk of Guillain-Barr Syndrome (GBS) after inactivated influenza vaccine (the flu shot). Young children who get the flu shot along with pneumococcal vaccine (PCV13), and/or DTaP vaccine at the same time might be slightly more likely to have a seizure caused by fever. Tell your health care provider if a child who is getting flu vaccine has ever had a seizure. People sometimes faint after medical procedures, including vaccination. Tell your provider if you feel dizzy or have vision changes or ringing in the ears. As with any medicine, there  is a very remote chance of a vaccine causing a severe allergic reaction, other serious injury, or death. 5. What if there is a serious problem? An allergic reaction could occur after the vaccinated person leaves the clinic. If you see signs of a severe allergic reaction (hives, swelling of the face and throat, difficulty  breathing, a fast heartbeat, dizziness, or weakness), call 9-1-1 and get the person to the nearest hospital. For other signs that concern you, call your health care provider. Adverse reactions should be reported to the Vaccine Adverse Event Reporting System (VAERS). Your health care provider will usually file this report, or you can do it yourself. Visit the VAERS website at www.vaers.SamedayNews.es or call 6045494471.VAERS is only for reporting reactions, and VAERS staff do not give medical advice. 6. The National Vaccine Injury Compensation Program The [employer] Vaccine Injury Compensation Program (VICP) is a federal program that was created to compensate people who may have been injured by certain vaccines. Visit the VICP website at GoldCloset.com.ee or call 365-654-6157 to learn about the program and about filing a claim. There is a time limit to file a claim for compensation. 7. How can I learn more?  Ask your healthcare provider.  Call your local or state health department.  Contact the Centers for Disease Control and Prevention (CDC): ? Call (629)787-9607 (1-800-CDC-INFO) or ? Visit CDC's https://gibson.com/ Vaccine Information Statement (Interim) Inactivated Influenza Vaccine (12/14/2017) This information is not intended to replace advice given to you by your health care provider. Make sure you discuss any questions you have with your health care provider. Document Revised: 08/07/2018 Document Reviewed: 12/18/2017 Elsevier Patient Education  [place].

## 2020-05-05 NOTE — Assessment & Plan Note (Signed)
Compliant to propanolol 80 mg twice daily, which is not likely to be doing a whole lot for blood pressure control.  She has not taken amlodipine 5 mg, has also not been monitoring her blood pressure at home recently.  I requested that her daughter send [place] blood pressure readings in about 1 week.  Discussed goal of less than 150/90.  Consider adding amlodipine if needed.

## 2020-05-05 NOTE — Progress Notes (Signed)
Subjective:    Patient ID: Shannon Higgins, female    DOB: 01-Jun-1934, 86 y.o.   MRN: [id number]  HPI  This visit occurred during the SARS-CoV-2 public health emergency.  Safety protocols were in place, including screening questions prior to the visit, additional usage of staff PPE, and extensive cleaning of exam room while observing appropriate contact time as indicated for disinfecting solutions.   Shannon Higgins is a 86 year old female with a history of hypertension, TIA, OSA, GERD, hypothyroidism, type 2 diabetes, urinary sepsis, chronic back pain, gout, recurrent falls, recurrent UTI who presents today for Higgins follow up and general follow-up.  1) Higgins Follow Up:  She presented to [facility] ED on 04/23/20 for fevers and weakness that began two days prior. She was noted to be tachycardic upon arrival, also labs with leukocytosis, delirium, and UA suggesting UTI, code sepsis was initiated and she was admitted.  During her Higgins stay she was treated with IV Rocephin, IV fluids. A1C was checked which showed a level of 6.1. Her mental status improved with antibiotic treatment so she was discharged home the following day (04/24/20) with a prescription for Augmentin five day course for E coli UTI. She was initiated on amlodipine 5 mg just prior to discharge due to elevated blood pressure readings.  Since her Higgins stay she is doing well, denies urinary symptoms, family denies acute confusion.  She has had numerous culture positive UTIs in 2021.  She saw Urology a few years ago for recurrent UTI was not given any advice.  She has a history of breast cancer, has never been on preventative treatment for UTIs in the past.  2) Hypertension: Currently managed on propanolol 80 mg twice daily, now on amlodipine 5 mg which was initiated in late December 2021 after Higgins stay. She has not been checking her blood pressure at home recently.  She has not taken her amlodipine.  3)  Insomnia/depression/dementia: She continues to do well on Trazodone for which she takes at bedtime for sleep.  She is managed on.physical 10 mg at bedtime for dementia, duloxetine 60 mg daily for depression.  Following with neurology for migraines and dementia.  4) Hypothyroidism: Currently managed on levothyroxine 112 mcg, but she has been taking 125 mcg for months as she ran out of the 112 dose.  She is taking her levothyroxine every morning on empty stomach with water only.  She does not take any other medications or eat for at least 30 minutes.  She separates her omeprazole at least 4 hours.  BP Readings from Last 3 Encounters:  05/05/20 (!) 150/88  04/24/20 (!) 139/96  01/20/20 137/76     Review of Systems  Respiratory: Negative for shortness of breath.   Cardiovascular: Negative for chest pain.  Genitourinary: Negative for dysuria and hematuria.  Psychiatric/Behavioral: Negative for confusion and sleep disturbance.       Past Medical History:  Diagnosis Date  . Acute tubular injury of transplanted kidney ([place]) 06/14/2016  . AKI (acute kidney injury) ([place]) 06/14/2016  . Asthma   . Breast cancer ([place])   . CAP (community acquired pneumonia) 04/29/2012  . Chronic sinusitis   . Confusion 12/06/2016  . Diabetes mellitus without complication ([place])   . GERD (gastroesophageal reflux disease)   . History of ankle fracture 05/14/2018   Last Assessment & Plan:  With a recent fall. Right medial malleous. Has ortho follow up soon, splinted now and tylenol helps her pain  . History of recurrent  UTIs   . Hypertension   . Hypokalemia 04/26/2012  . Hyponatremia   . Insomnia   . Migraine   . Neuropathic pain   . Pneumonia   . Rheumatoid arthritis ([place])   . Sepsis ([place]) 04/26/2012  . Sleep apnea   . Stroke ([place])   . Thyroid disease      Social History   Socioeconomic History  . Marital status: Widowed    Spouse name: Not on file  . Number of children: Not on file  . Years of  education: Not on file  . Highest education level: Not on file  Occupational History  . Not on file  Tobacco Use  . Smoking status: Never Smoker  . Smokeless tobacco: Never Used  Vaping Use  . Vaping Use: Never used  Substance and Sexual Activity  . Alcohol use: No  . Drug use: No  . Sexual activity: Not on file  Other Topics Concern  . Not on file  Social History Narrative   Pt lives in 1 story home with her daughter, Shannon Higgins and Shannon Higgins   Has 2 adult daughters   Highest level of education: some college   Worked mainly as Web designer.   Social Determinants of Health   Financial Resource Strain: Not on file  Food Insecurity: Not on file  Transportation Needs: Not on file  Physical Activity: Not on file  Stress: Not on file  Social Connections: Not on file  Intimate Partner Violence: Not on file    Past Surgical History:  Procedure Laterality Date  . ABDOMINAL HYSTERECTOMY    . BACK SURGERY    . BREAST BIOPSY Right   . BREAST LUMPECTOMY Left 2015  . CHOLECYSTECTOMY    . KNEE SURGERY    . SHOULDER SURGERY      Family History  Problem Relation Age of Onset  . Diabetes Daughter   . Hypertension Daughter   . Fibromyalgia Daughter   . GER disease Daughter   . Fibromyalgia Daughter   . Crohn's disease Daughter   . Asthma Daughter   . Hypertension Mother     No Known Allergies  Current Outpatient Medications on File Prior to Visit  Medication Sig Dispense Refill  . Accu-Chek FastClix Lancets MISC Use as instructed to test blood sugar daily 300 each 1  . ACCU-CHEK GUIDE test strip Use as instructed to blood sugar daily 300 each 1  . amLODipine (NORVASC) 5 MG tablet Take 1 tablet (5 mg total) by mouth daily. 30 tablet 0  . aspirin EC 325 MG tablet Take 325 mg by mouth daily.    . blood glucose meter kit and supplies Dispense based on patient and insurance preference. Use up to 2 times daily as directed. (FOR ICD-10 E10.9, E11.9). 1 each 0  .  donepezil (ARICEPT) 10 MG tablet Take 1 tablet daily (Patient taking differently: Take 10 mg by mouth at bedtime. Take 1 tablet daily) 90 tablet 3  . dorzolamide-timolol (COSOPT) 22.3-6.8 MG/ML ophthalmic solution Place 1 drop into both eyes 2 (two) times daily.    . DULoxetine (CYMBALTA) 60 MG capsule Take 60 mg by mouth daily.    . fluticasone (FLONASE) 50 MCG/ACT nasal spray Place 1 spray into both nostrils daily. 48 g 0  . gabapentin (NEURONTIN) 800 MG tablet TAKE 1 TABLET BY MOUTH THREE TIMES A DAY (Patient taking differently: Take 800 mg by mouth 3 (three) times daily. TAKE 1 TABLET BY MOUTH THREE TIMES A DAY) 90 tablet  0  . latanoprost (XALATAN) 0.005 % ophthalmic solution Place 1 drop into both eyes at bedtime.    [name] levothyroxine (SYNTHROID) 112 MCG tablet Take 1 tablet by mouth every morning on an empty stomach with water only.  No food or other medications for 30 minutes. (Patient taking differently: Take 112 mcg by mouth daily before breakfast. Take 1 tablet by mouth every morning on an empty stomach with water only.  No food or other medications for 30 minutes.) 90 tablet 0  . meclizine (ANTIVERT) 25 MG tablet Take 1 tablet (25 mg total) by mouth 2 (two) times daily as needed for dizziness. 180 tablet 0  . meloxicam (MOBIC) 15 MG tablet Take 1 tablet (15 mg total) by mouth daily as needed for pain. 90 tablet 0  . Netarsudil Dimesylate (RHOPRESSA) 0.02 % SOLN Place 1 drop into both eyes daily at 6 (six) AM.    . omeprazole (PRILOSEC) 20 MG capsule TAKE 1 CAPSULE BY MOUTH TWICE DAILY FOR HEARTBURN. (Patient taking differently: Take 20 mg by mouth 2 (two) times daily before a meal. TAKE 1 CAPSULE BY MOUTH TWICE DAILY FOR HEARTBURN.) 180 capsule 1  . pilocarpine (PILOCAR) 2 % ophthalmic solution Place 1 drop into both eyes 4 (four) times daily.     . propranolol (INDERAL) 80 MG tablet TAKE 1 TABLET BY MOUTH ONCE DAILY FOR BLOOD PRESSURE. (Patient taking differently: Take 80 mg by mouth daily.  Take 1 tablet by mouth once daily for blood pressure.) 90 tablet 1  . Rimegepant Sulfate (NURTEC) 75 MG TBDP Take 1 tablet by mouth as needed. Take 1 tablet as needed for migraine. Do not take more than 1 in 24 hours. 10 tablet 5  . rosuvastatin (CRESTOR) 10 MG tablet TAKE 1 TABLET BY MOUTH ONCE DAILY FOR CHOLESTEROL. (Patient taking differently: Take 10 mg by mouth daily. Take 1 tablet by mouth once daily for cholesterol.) 90 tablet 2  . traZODone (DESYREL) 100 MG tablet Take two tablets at bedtime as needed for sleep (Patient taking differently: Take 200 mg by mouth at bedtime as needed for sleep. Take two tablets at bedtime as needed for sleep) 180 tablet 0  . BOTOX 200 units SOLR      No current facility-administered medications on file prior to visit.    BP (!) 150/88   Pulse 76   Temp 98.3 F (36.8 C) (Temporal)   Ht 5' 4"  (1.626 m)   Wt 158 lb (71.7 kg)   SpO2 98%   BMI 27.12 kg/m    Objective:   Physical Exam Constitutional:      Appearance: She is well-nourished.  Cardiovascular:     Rate and Rhythm: Normal rate and regular rhythm.  Pulmonary:     Effort: Pulmonary effort is normal.     Breath sounds: Normal breath sounds.  Musculoskeletal:     Cervical back: Neck supple.  Skin:    General: Skin is warm and dry.  Neurological:     Mental Status: She is oriented to person, place, and time.  Psychiatric:        Mood and Affect: Mood and affect normal.            Assessment & Plan:

## 2020-05-05 NOTE — Assessment & Plan Note (Signed)
Compliant to rosuvastatin 10 mg, continue same. Repeat lipid panel pending.  

## 2020-05-05 NOTE — Assessment & Plan Note (Signed)
Seems to be doing well on trazodone 200 mg at bedtime, continue same.

## 2020-05-06 ENCOUNTER — Telehealth: Payer: Self-pay | Admitting: Primary Care

## 2020-05-06 DIAGNOSIS — Z1231 Encounter for screening mammogram for malignant neoplasm of breast: Secondary | ICD-10-CM

## 2020-05-06 DIAGNOSIS — R1900 Intra-abdominal and pelvic swelling, mass and lump, unspecified site: Secondary | ICD-10-CM

## 2020-05-06 DIAGNOSIS — Z853 Personal history of malignant neoplasm of breast: Secondary | ICD-10-CM

## 2020-05-06 DIAGNOSIS — N390 Urinary tract infection, site not specified: Secondary | ICD-10-CM

## 2020-05-06 LAB — COMPREHENSIVE METABOLIC PANEL
ALT: 13 U/L (ref 0–35)
AST: 18 U/L (ref 0–37)
Albumin: 4.4 g/dL (ref 3.5–5.2)
Alkaline Phosphatase: 80 U/L (ref 39–117)
BUN: 16 mg/dL (ref 6–23)
CO2: 28 mEq/L (ref 19–32)
Calcium: 9.6 mg/dL (ref 8.4–10.5)
Chloride: 101 mEq/L (ref 96–112)
Creatinine, Ser: 0.92 mg/dL (ref 0.40–1.20)
GFR: 57.78 mL/min — ABNORMAL LOW (ref >60.00)
Glucose, Bld: 165 mg/dL — ABNORMAL HIGH (ref 70–99)
Potassium: 4 mEq/L (ref 3.5–5.1)
Sodium: 141 mEq/L (ref 135–145)
Total Bilirubin: 0.4 mg/dL (ref 0.2–1.2)
Total Protein: 7.1 g/dL (ref 6.0–8.3)

## 2020-05-06 LAB — CBC
HCT: 36.8 % (ref 36.0–46.0)
Hemoglobin: 11.9 g/dL — ABNORMAL LOW (ref 12.0–15.0)
MCHC: 32.3 g/dL (ref 30.0–36.0)
MCV: 86.7 fl (ref 78.0–100.0)
Platelets: 386 10*3/uL (ref 150.0–400.0)
RBC: 4.25 Mil/uL (ref 3.87–5.11)
RDW: 15.4 % (ref 11.5–15.5)
WBC: 11.1 10*3/uL — ABNORMAL HIGH (ref 4.0–10.5)

## 2020-05-06 LAB — LIPID PANEL
Cholesterol: 159 mg/dL (ref 0–200)
HDL: 50.8 mg/dL (ref >39.00)
NonHDL: 107.9
Total CHOL/HDL Ratio: 3
Triglycerides: 279 mg/dL — ABNORMAL HIGH (ref 0.0–149.0)
VLDL: 55.8 mg/dL — ABNORMAL HIGH (ref 0.0–40.0)

## 2020-05-06 LAB — TSH: TSH: 0.86 u[IU]/mL (ref 0.35–4.50)

## 2020-05-06 LAB — LDL CHOLESTEROL, DIRECT: Direct LDL: 73 mg/dL

## 2020-05-06 LAB — URIC ACID: Uric Acid, Serum: 5.2 mg/dL (ref 2.4–7.0)

## 2020-05-06 NOTE — Telephone Encounter (Signed)
Unable to schedule due to missing orders - please place order for BIL [place]   Due to the patient having her Diagnostic Mammogram at [facility], they require two [place] orders, One for the affected breast and one for the other breast in case they want a comparison [place].  Please specify in the order the clock location of the breast issue.   Below are the correct orders for a Diagnostic MM with Bilateral US  [id number]   MM DIAG BREAST TOMO BILATERAL [id number]   US BREAST LTD UNI LEFT INC AXILLA [id number]   US BREAST LTD UNI RIGHT INC AXILLA

## 2020-05-06 NOTE — Telephone Encounter (Signed)
Noted, orders placed. 

## 2020-05-07 NOTE — Telephone Encounter (Signed)
Pt is going to call and have this scheduled.   Nothing further needed.

## 2020-05-08 MED ORDER — TRIMETHOPRIM 100 MG PO TABS: 100.0000 mg | ORAL_TABLET | Freq: Every day | ORAL | 0 refills | Status: DC

## 2020-05-13 ENCOUNTER — Other Ambulatory Visit: Payer: Self-pay | Admitting: Primary Care

## 2020-05-13 DIAGNOSIS — E785 Hyperlipidemia, unspecified: Secondary | ICD-10-CM

## 2020-05-13 DIAGNOSIS — M792 Neuralgia and neuritis, unspecified: Secondary | ICD-10-CM

## 2020-05-13 DIAGNOSIS — E039 Hypothyroidism, unspecified: Secondary | ICD-10-CM

## 2020-05-13 DIAGNOSIS — G8929 Other chronic pain: Secondary | ICD-10-CM

## 2020-05-13 DIAGNOSIS — R42 Dizziness and giddiness: Secondary | ICD-10-CM

## 2020-05-13 DIAGNOSIS — R519 Headache, unspecified: Secondary | ICD-10-CM

## 2020-05-13 DIAGNOSIS — G4700 Insomnia, unspecified: Secondary | ICD-10-CM

## 2020-05-13 NOTE — Telephone Encounter (Signed)
Pharmacy requests refill on: Accu-Check Guide Strip & Accu-Chek Fast Clix Lancet   LAST REFILL: 04/23/2019 LAST OV: 05/05/2020 NEXT OV: Not Scheduled  PHARMACY: [place], [place] requests refill on: Meloxicam 15 mg  LAST REFILL: 10/30/2019 (Q-90, R-0) LAST OV: 05/05/2020 NEXT OV: Not Scheduled  PHARMACY: [place], [place] requests refill on: Duloxetine 60 mg   LAST REFILL: Unknown LAST OV: 05/05/2020 NEXT OV: Not Scheduled  PHARMACY: [facility] [place], [place] requests refill on: Rosuvastatin 10 mg   LAST REFILL: 01/01/2019 (Q-90, R-2) LAST OV: 05/05/2020 NEXT OV: Not Scheduled  PHARMACY: [place], [place] requests refill on: Levothyroxine 112 mcg  LAST REFILL: 04/12/2019 LAST OV: 05/05/2020 NEXT OV: Not Scheduled  PHARMACY: [place], [place]  TSH (05/05/2020): 0.86  Pharmacy requests refill on: Propanolol 80 mg   LAST REFILL: 03/21/2019 LAST OV: 05/05/2020 NEXT OV: Not Scheduled  PHARMACY: [place], [place]

## 2020-05-13 NOTE — Telephone Encounter (Signed)
Pharmacy requests refill on: Gabapentin 800 mg   LAST REFILL: 08/30/2019 (Q-90, R-0) LAST OV: 05/05/2020 NEXT OV: Not Scheduled  PHARMACY: [id number] [place], [place]   Called and spoke with patient who verified that she is still taking this medication. Patient stated that her feet are bothering her.

## 2020-05-13 NOTE — Telephone Encounter (Signed)
Refills sent to pharmacy. 

## 2020-05-21 ENCOUNTER — Ambulatory Visit
Admission: RE | Admit: 2020-05-21 | Discharge: 2020-05-21 | Disposition: A | Discharge status: Home / Self Care | Payer: Medicare HMO | Source: Ambulatory Visit | Attending: Primary Care | Admitting: Primary Care

## 2020-05-21 ENCOUNTER — Other Ambulatory Visit: Payer: Self-pay

## 2020-05-21 ENCOUNTER — Other Ambulatory Visit: Payer: Self-pay | Admitting: Primary Care

## 2020-05-21 DIAGNOSIS — Z90710 Acquired absence of both cervix and uterus: Secondary | ICD-10-CM | POA: Diagnosis not present

## 2020-05-21 DIAGNOSIS — R1900 Intra-abdominal and pelvic swelling, mass and lump, unspecified site: Secondary | ICD-10-CM

## 2020-05-21 DIAGNOSIS — R1909 Other intra-abdominal and pelvic swelling, mass and lump: Secondary | ICD-10-CM | POA: Diagnosis not present

## 2020-06-01 ENCOUNTER — Telehealth: Payer: Self-pay | Admitting: Primary Care

## 2020-06-01 DIAGNOSIS — R1900 Intra-abdominal and pelvic swelling, mass and lump, unspecified site: Secondary | ICD-10-CM

## 2020-06-01 NOTE — Telephone Encounter (Signed)
Referral placed. I am not sure if that result note was ever sent to me, my apologies regarding the delay.

## 2020-06-01 NOTE — Telephone Encounter (Signed)
Patient called and was following up on GYN referral.  Per [place] results the referral was requested for [facility].   Please advise, thanks.

## 2020-06-02 ENCOUNTER — Telehealth: Payer: Self-pay | Admitting: Primary Care

## 2020-06-02 ENCOUNTER — Telehealth: Payer: Self-pay

## 2020-06-02 DIAGNOSIS — N898 Other specified noninflammatory disorders of vagina: Secondary | ICD-10-CM

## 2020-06-02 NOTE — Telephone Encounter (Signed)
noted 

## 2020-06-02 NOTE — Telephone Encounter (Signed)
Left message to return call to our office.  Need to get more information about symptoms.

## 2020-06-02 NOTE — Telephone Encounter (Signed)
[facility] referring for pelvic mass. Called and left voicemail for patient to call back to be scheduled.

## 2020-06-02 NOTE — Telephone Encounter (Signed)
Pt called in she had a UTI and now she has a yeast infection wanted to know if she can get something for yeast infection.  She has a mass in her stomach and they stated that a urologist would call her and nobody has called her

## 2020-06-03 MED ORDER — FLUCONAZOLE 150 MG PO TABS: 150.0000 mg | ORAL_TABLET | Freq: Once | ORAL | 0 refills | Status: AC

## 2020-06-03 NOTE — Telephone Encounter (Signed)
Patient was stared on trimethoprim . She has started having vaginal itching not long after starting medications. She has tried over the counter yeast medication with no improvement. Denies any discharge or burning. Would like something called in. She denies any urinary symptoms no abdominal pain or fever.

## 2020-06-03 NOTE — Telephone Encounter (Signed)
Noted. Tell her to stop the trimethoprim antibiotic for now.  Have her see GYN.   Will send in one dose of fluconazole for likely antibiotic induced yeast infection.

## 2020-06-03 NOTE — Telephone Encounter (Signed)
[facility] - Client Nonclinical Telephone Record [facility] Client [facility] [facility] - Client Client Site [place] - Night Physician AA - PHYSICIAN, Verita Schneiders- MD Contact Type Call Who Is Calling Patient / Member / Family / Caregiver Caller Name Tonantzin Mimnaugh Phone Number [phone] Call Type Message Only Information Provided Reason for Call Returning a Call from the Office Initial [place] states, returning a call from office Disp. Time Disposition Final User 06/02/2020 6:22:23 PM General Information Provided Yes Jobie Quaker Call Closed By: Jobie Quaker Transaction Date/Time: 06/02/2020 6:21:00 PM (ET)

## 2020-06-03 NOTE — Telephone Encounter (Signed)
Also provided number to [facility] to call to set up app. When I review her chart looks like they tried to call. She will let us know if any issues.

## 2020-06-03 NOTE — Telephone Encounter (Signed)
Patent will stop medication and pick up the fluconazole. She does have appointment for GYN for tomorrow but daughter can not take so she is going to call and reschedule.

## 2020-06-03 NOTE — Addendum Note (Signed)
Addended by: Pleas Koch on: 06/03/2020 11:35 AM   Modules accepted: Orders

## 2020-06-04 ENCOUNTER — Encounter: Payer: Self-pay | Admitting: Obstetrics & Gynecology

## 2020-06-09 IMAGING — US US CAROTID DUPLEX BILAT
1 series · 13 of 24 positions shown · non-contrast
Comparison: None.

CLINICAL DATA: Stroke workup

EXAM:
BILATERAL CAROTID DUPLEX ULTRASOUND
TECHNIQUE: Gray scale imaging, color Doppler and duplex ultrasound were
performed of bilateral carotid and vertebral arteries in the neck.

[Series 1: us carotid duplex bilat · 0.06mm/px · 13 of 49 slices shown]
[im 1/49]
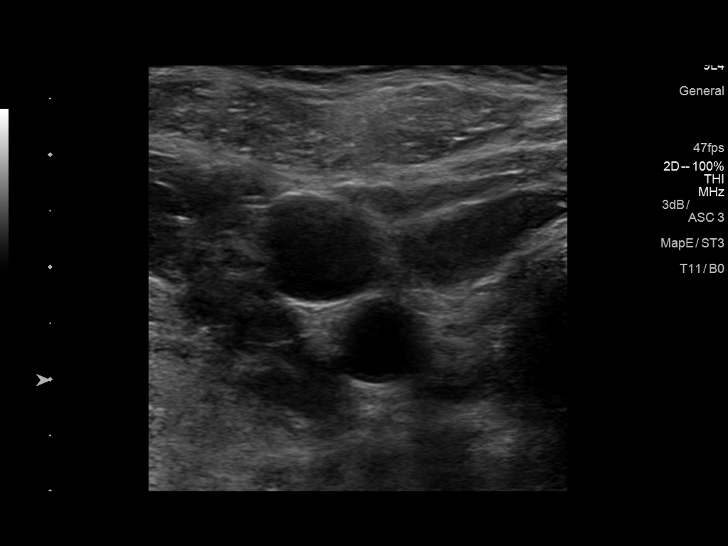
[im 5/49]
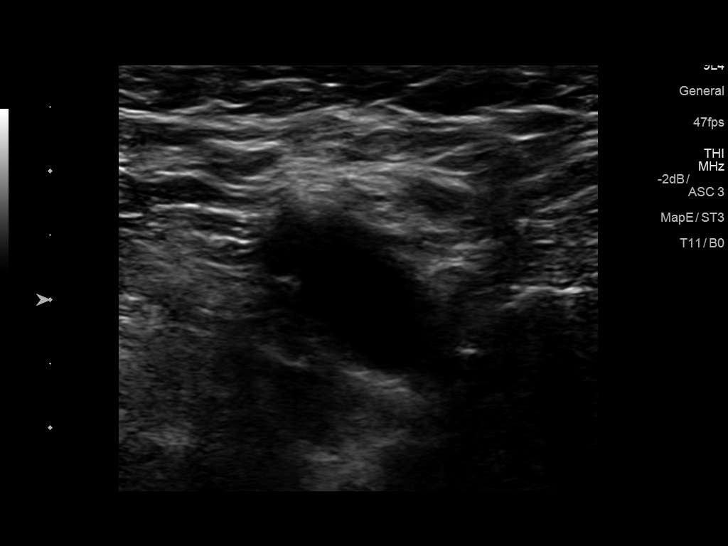
[im 9/49]
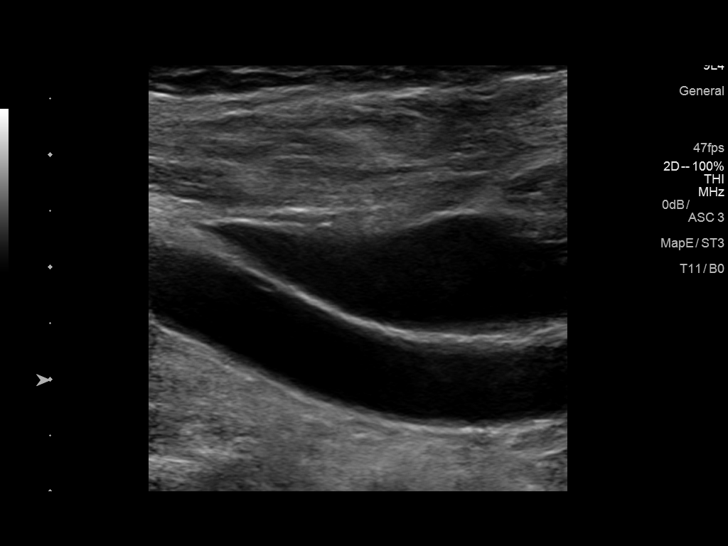
[im 13/49]
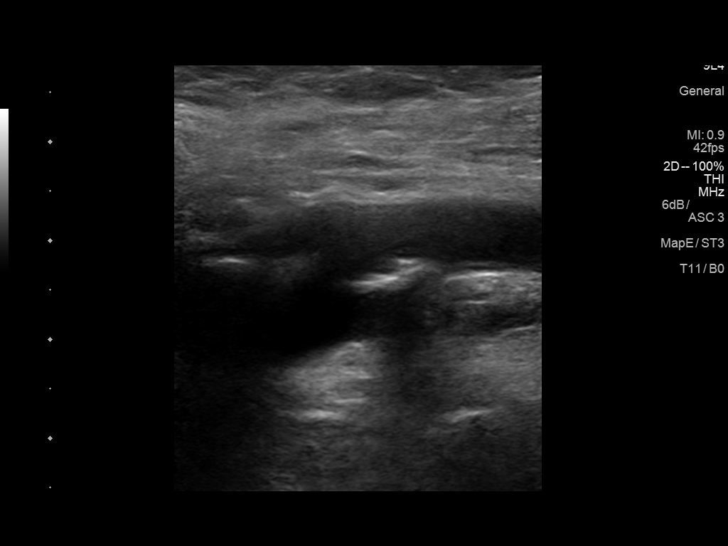
[im 17/49]
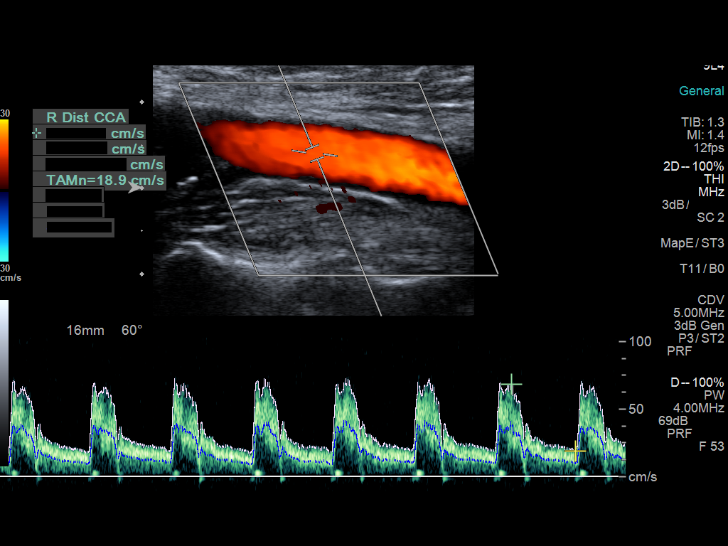
[im 21/49]
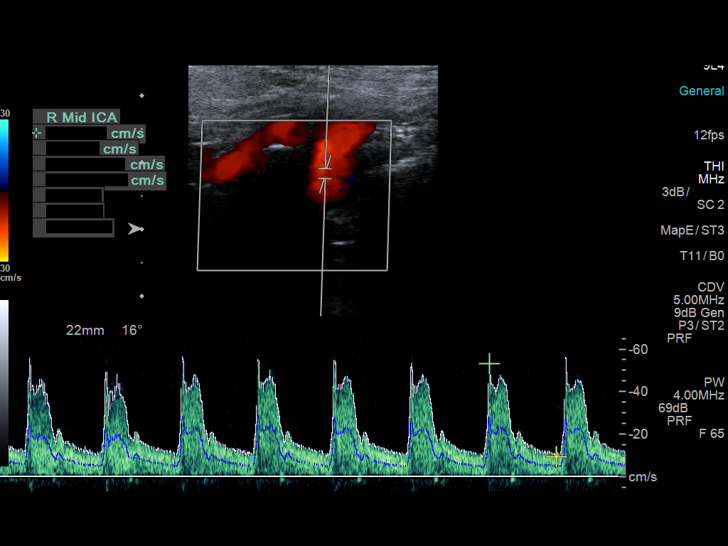
[im 26/49]
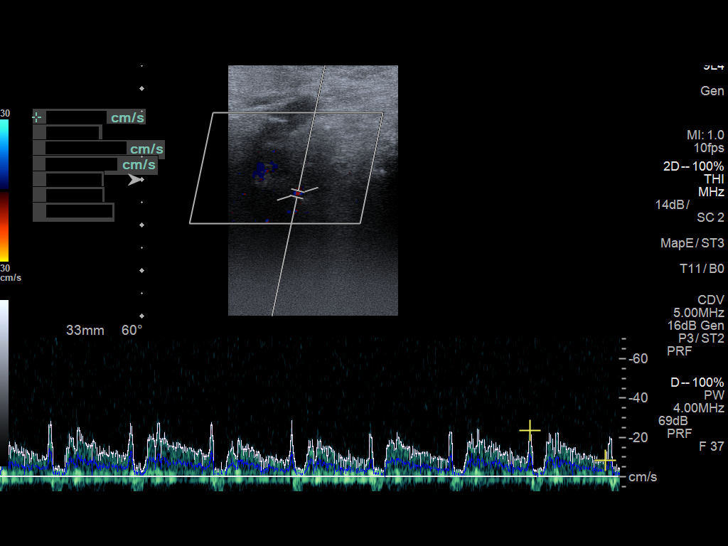
[im 28/49]
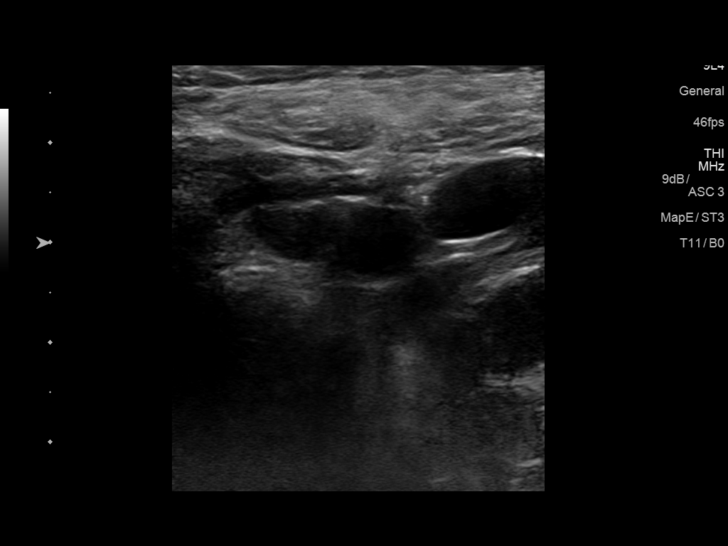
[im 32/49]
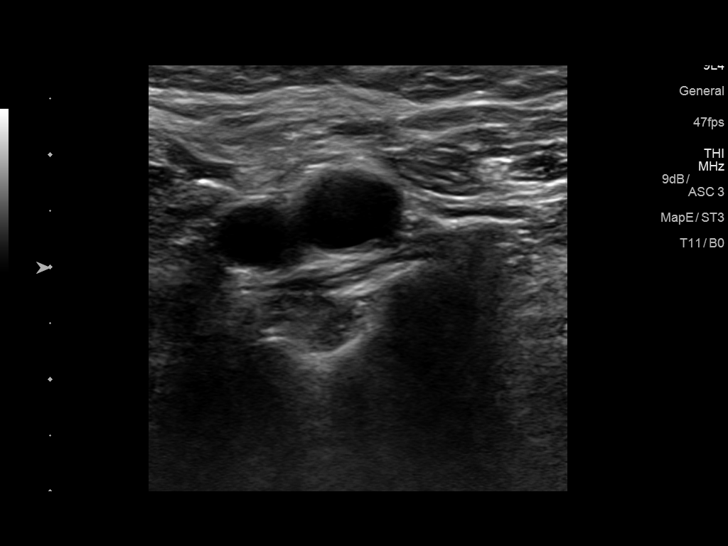
[im 36/49]
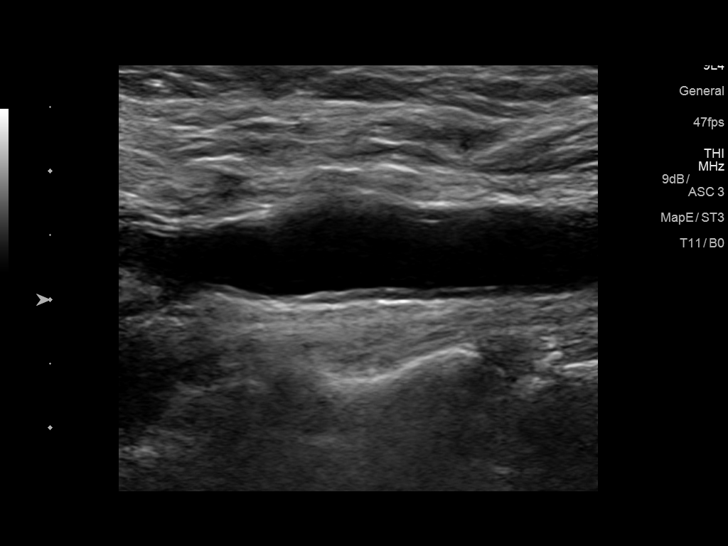
[im 40/49]
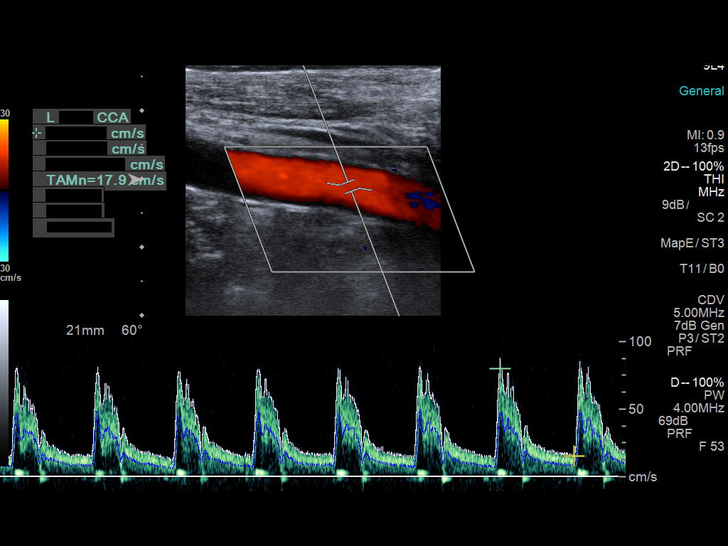
[im 44/49]
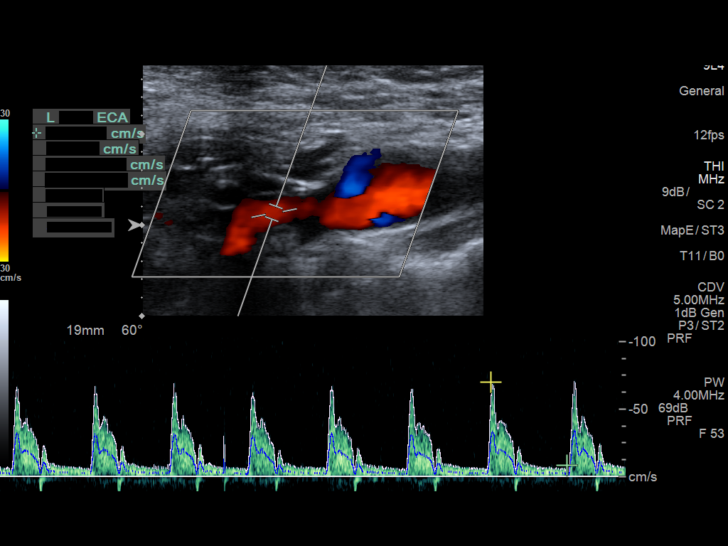
[im 49/49]
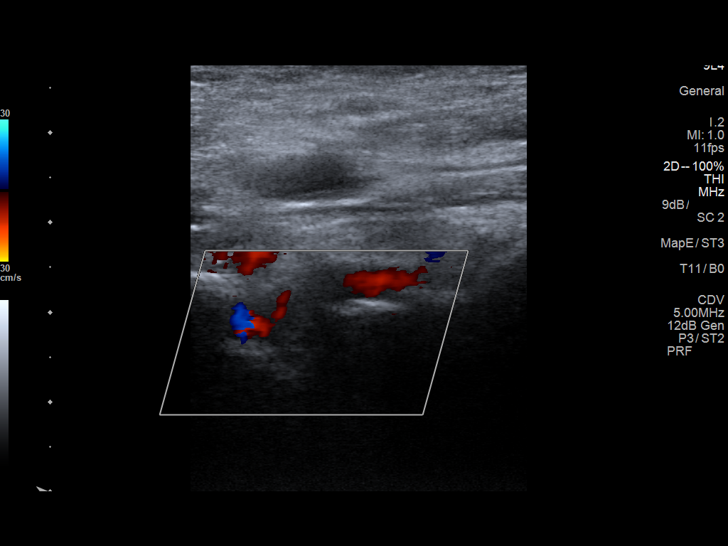

[13 of 24 positions shown; findings below may reference images not displayed]

FINDINGS: Criteria: Quantification of carotid stenosis is based on velocity
parameters that correlate the residual internal carotid diameter
with NASCET-based stenosis levels, using the diameter of the distal
internal carotid lumen as the denominator for stenosis measurement.

The following velocity measurements were obtained:

RIGHT

ICA:  58 cm/sec

CCA:  95 cm/sec

SYSTOLIC ICA/CCA RATIO:

DIASTOLIC ICA/CCA RATIO:

ECA:  59 cm/sec

LEFT

ICA:  76 cm/sec

CCA:  80 cm/sec

SYSTOLIC ICA/CCA RATIO:

DIASTOLIC ICA/CCA RATIO:

ECA:  70 cm/sec

RIGHT CAROTID ARTERY: There is moderate calcified plaque in the
bulb. Low resistance internal carotid Doppler pattern is preserved.

RIGHT VERTEBRAL ARTERY: There is slight reversal of flow during
systole and a blunted upstroke. These findings indicate a more
proximal stenosis or early subclavian steal syndrome.

LEFT CAROTID ARTERY: Little if any plaque in the bulb. Low
resistance internal carotid Doppler pattern is preserved.

LEFT VERTEBRAL ARTERY:  Antegrade with a normal Doppler waveform.
IMPRESSION: Less than 50% stenosis in the right and left internal carotid
arteries.

Abnormal flow in the right vertebral artery as described indicating
either a more proximal stenosis or early subclavian steal syndrome.

Normal antegrade flow in the left vertebral artery.

## 2020-06-11 ENCOUNTER — Other Ambulatory Visit: Payer: Self-pay

## 2020-06-11 ENCOUNTER — Encounter: Payer: Self-pay | Admitting: Obstetrics & Gynecology

## 2020-06-11 ENCOUNTER — Ambulatory Visit (INDEPENDENT_AMBULATORY_CARE_PROVIDER_SITE_OTHER): Payer: Medicare HMO | Admitting: Obstetrics & Gynecology

## 2020-06-11 VITALS — BP 130/80 | Ht 64.0 in | Wt 156.0 lb

## 2020-06-11 DIAGNOSIS — R1900 Intra-abdominal and pelvic swelling, mass and lump, unspecified site: Secondary | ICD-10-CM | POA: Diagnosis not present

## 2020-06-11 NOTE — Patient Instructions (Signed)
Pelvic Mass, Female  A pelvic mass is an abnormal growth in the pelvis. The pelvis is the area between your hip bones. It includes the bladder, rectum, uterus, and ovaries. A pelvic mass may be found during a routine pelvic exam or while performing an MRI, CT scan, or ultrasound for other problems of the abdomen. What are common types of pelvic masses? Pelvic masses include:  Ovarian cysts. These are fluid-filled sacs that form on an ovary.  Tumors. These may be cancerous (malignant) or noncancerous (benign). Noncancerous tumors in the uterus are called uterine fibroids.  Ectopic pregnancy. This is when the fertilized egg attaches (implants) outside the uterus.  Infections. What type of testing may be needed? Your health care provider may recommend that you have tests to diagnose the cause of the pelvic mass. The following tests may be done if a pelvic mass is found:  Physical exam.  Blood tests.  X-rays.  Ultrasound.  CT scan.  MRI.  A surgery to look inside your abdomen with cameras (laparoscopy).  A biopsy that is performed with a needle or during laparoscopy or surgery. In some cases, what seemed like a pelvic mass may actually be something else, such as a mass in one of the organs that is near the pelvis, an infection (abscess), or scar tissue (adhesions) that formed after a surgery. Tests and physical exams may be done once, or they may be done regularly for a period of time. Tests and exams that are done regularly will help monitor whether the mass or tissue change is growing and becoming a concern. What are common treatments? Treatment is not always needed for this condition. Your health care provider may recommend careful monitoring (watchful waiting) and regular tests and exams. Treatment will depend on the cause of the mass. Follow these instructions at home:  What you need to do at home will depend on the cause of the mass. Follow the instructions that your health  care provider gives to you. In general: ? Keep all follow-up visits as directed by your health care provider. This is important. ? Take over-the-counter and prescription medicines only as directed by your health care provider. ? If you were prescribed an antibiotic medicine, take it as told by your health care provider. Do not stop taking the antibiotic even if you start to feel better. ? Follow any restrictions that are given to you by your health care provider.  Try to stay calm, and be sure to ask questions. Make sure you understand the recommendations for monitoring and whether there is a reason for concern. Contact a health care provider if you:  Develop new symptoms.  Note changes in the size, shape, or position of your mass.  Are unable to have a bowel movement.  Bruise or bleed easily. Get help right away if you:  Vomit bright red blood or vomit material that looks like coffee grounds.  Have blood in your stools, or the stools turn black and tarry.  Have an abnormal or increased amount of vaginal bleeding.  Have a fever or chills.  Develop sudden or worsening pain that is not relieved by medicine.  Feel dizzy or weak.  Feel light-headed or you faint.  Feel that the mass has suddenly gotten larger.  Develop severe bloating in your abdomen or your pelvis.  Cannot pass any urine. Summary  A pelvic mass is an abnormal growth in the pelvis. The pelvis is the area between your hip bones. It includes the bladder, rectum,  uterus, and ovaries.  Pelvic masses include ovarian cysts, tumors, ectopic pregnancy, or infections.  Your health care provider may recommend that you have tests to diagnose the cause of the pelvic mass.  Treatment will depend on the cause of the mass. This information is not intended to replace advice given to you by your health care provider. Make sure you discuss any questions you have with your health care provider. Document Revised: 05/10/2017  Document Reviewed: 05/10/2017 Elsevier Patient Education  2021 [employer].

## 2020-06-11 NOTE — Progress Notes (Signed)
HPI: Patient is a 86 y.o. [id number] who LMP was No LMP recorded. Patient has had a hysterectomy., presents today for a problem visit.  She complains of recent findings of Left adnexal mass by Ultrasound - Pelvic Vaginal, CT - Pelvis.  Pt has had symptoms of no pelvic pain, bloating or weight changes.  CT done while in hospital for urosepsis, now fully recovered.  She is a healthy female w no other concerns today.  She has had prior hysterectomy for endometriosis decades ago.  Kept ovaries then.  Prior NSVD prior to that x2.  Prior surgery and therapy (no chemo or RT) for breast cancer.  US- FINDINGS: Uterus Surgically absent.  No visible abnormality about the vaginal cuff. Endometrium Surgically absent. Right ovary The native right ovary is not visualized. Left ovary The native left ovary is not visualized. Other findings: Large cystic mass positioned along the midline is seen, corresponding with abnormality on prior CT. Lesion measures approximately 17.0 x 10.7 x 11.9 cm. Lesion is mildly complex with a small internal loculated mural based cystic component, with a few additional scattered low-level internal echoes. Possible focus of mural solid nodularity noted as well (image 18). No appreciable internal vascularity. No visible free fluid.  IMPRESSION: 1. 17.0 x 10.7 x 11.9 cm complex cystic midline pelvic mass, corresponding with abnormality on prior CT. Finding is indeterminate, but could reflect a cystic ovarian neoplasm. Gynecologic referral for further workup and surgical consultation recommended. 2. Nonvisualization of the native ovaries. 3. Status post hysterectomy.  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[place]) (06/14/2016), AKI (acute kidney injury) ([place]) (06/14/2016), Asthma, Breast cancer ([place]), CAP (community acquired pneumonia) (04/29/2012), Chronic sinusitis, Confusion (12/06/2016), Diabetes mellitus without complication ([place]),  GERD (gastroesophageal reflux disease), History of ankle fracture (05/14/2018), History of recurrent UTIs, Hypertension, Hypokalemia (04/26/2012), Hyponatremia, Insomnia, Migraine, Neuropathic pain, Pneumonia, Rheumatoid arthritis ([place]), Sepsis ([place]) (04/26/2012), Sleep apnea, Stroke ([place]), and Thyroid disease. Also,  has a past surgical history that includes Back surgery; Knee surgery; Cholecystectomy; Abdominal hysterectomy; Shoulder surgery; Breast biopsy (Right); and Breast lumpectomy (Left, 2015)., family history includes Asthma in her daughter; Crohn's disease in her daughter; Diabetes in her daughter; Fibromyalgia in her daughter and daughter; GER disease in her daughter; Hypertension in her daughter and mother.,  reports that she has never smoked. She has never used smokeless tobacco. She reports that she does not drink alcohol and does not use drugs.  She has a current medication list which includes the following prescription(s): accu-chek fastclix lancets, accu-chek guide, aspirin ec, blood glucose meter kit and supplies, botox, donepezil, dorzolamide-timolol, duloxetine, fluticasone, gabapentin, latanoprost, levothyroxine, meclizine, meloxicam, rhopressa, omeprazole, pilocarpine, propranolol, nurtec, rosuvastatin, trazodone, and trimethoprim. Also, has No Known Allergies.  Review of Systems  Constitutional: Negative for chills, fever and malaise/fatigue.  HENT: Negative for congestion, sinus pain and sore throat.   Eyes: Positive for redness. Negative for blurred vision and pain.  Respiratory: Negative for cough and wheezing.   Cardiovascular: Negative for chest pain and leg swelling.  Gastrointestinal: Negative for abdominal pain, constipation, diarrhea, heartburn, nausea and vomiting.  Genitourinary: Negative for dysuria, frequency, hematuria and urgency.  Musculoskeletal: Negative for back pain, joint pain, myalgias and neck pain.  Skin: Positive for itching. Negative for rash.   Neurological: Positive for tingling, weakness and headaches. Negative for dizziness and tremors.  Endo/Heme/Allergies: Does not bruise/bleed easily.  Psychiatric/Behavioral: Negative for depression. The patient is not nervous/anxious and does not have insomnia.  Objective: BP 130/80   Ht _0  (1.626 m)   Wt 156 lb (70.8 kg)   BMI 26.78 kg/m  Physical Exam Constitutional:      General: She is not in acute distress.    Appearance: She is well-developed.  Genitourinary:     Bladder, vagina and urethral meatus normal.     Genitourinary Comments: Cuff intact/ no lesions  Absent uterus and cervix     Right Labia: No rash or tenderness.    Left Labia: No tenderness or rash.    Vaginal cuff intact.    No vaginal erythema or bleeding.     No vaginal prolapse present.    Moderate vaginal atrophy present.     Left Adnexa: mass present.    Adnexa exam comments: Midline mass, mild pressure on vag apex, T.     Cervix is absent.     Uterus is absent.     Pelvic exam was performed with patient in the lithotomy position.  HENT:     Head: Normocephalic and atraumatic.     Nose: Nose normal.  Abdominal:     General: There is no distension.     Palpations: Abdomen is soft.     Tenderness: There is no abdominal tenderness.     Comments: I can feel a mass that is mildly T w deep palpation over the mid abd, to the level of just above the umbilicus  Musculoskeletal:        General: Normal range of motion.  Neurological:     Mental Status: She is alert and oriented to person, place, and time.     Cranial Nerves: No cranial nerve deficit.  Skin:    General: Skin is warm and dry.  Psychiatric:        Attention and Perception: Attention normal.        Mood and Affect: Mood normal.        Speech: Speech normal.        Behavior: Behavior normal.        Cognition and Memory: Cognition normal.        Judgment: Judgment normal.     ASSESSMENT/PLAN:    Problem List Items Addressed This  Visit    Visit Diagnoses    Pelvic mass    -  Primary/New   Relevant Orders   Ovarian Malignancy Risk-ROMA    Counseled on size (17cm) of mass and likelhood for sx's or neg effects based on size; also risks for cancer Surgery to remove mass discussed Gyn Onc if concern for cancer heightened based on lab tests Min Laparotomy, vs other approaches for surgery.  Recover expectations discussed.  Pt and daughter present for discussion and exam.  Barnett Applebaum, MD, [facility], [facility] 06/11/2020  10:09 AM

## 2020-06-12 ENCOUNTER — Ambulatory Visit: Payer: Medicare HMO | Admitting: Neurology

## 2020-06-12 ENCOUNTER — Encounter: Payer: Self-pay | Admitting: Obstetrics & Gynecology

## 2020-06-12 DIAGNOSIS — G43709 Chronic migraine without aura, not intractable, without status migrainosus: Secondary | ICD-10-CM

## 2020-06-12 MED ORDER — ONABOTULINUMTOXINA 100 UNITS IJ SOLR
200.0000 [IU] | Freq: Once | INTRAMUSCULAR | Status: AC
  Administered 2020-06-12: 155 [IU] via INTRAMUSCULAR

## 2020-06-12 NOTE — Progress Notes (Signed)
[facility]  ° °Procedure Note Botox ° °Attending: Dr. Machi Whittaker ° °Preoperative Diagnosis(es): Chronic migraine ° °Consent obtained from: The patient °Benefits discussed included, but were not limited to decreased muscle tightness, increased joint range of motion, and decreased pain.  Risk discussed included, but were not limited pain and discomfort, bleeding, bruising, excessive weakness, venous thrombosis, muscle atrophy and dysphagia.  Anticipated outcomes of the procedure as well as he risks and benefits of the alternatives to the procedure, and the roles and tasks of the personnel to be involved, were discussed with the patient, and the patient consents to the procedure and agrees to proceed. A copy of the patient medication guide was given to the patient which explains the blackbox warning. ° °Patients identity and treatment sites confirmed Yes.  . ° °Details of Procedure: °Skin was cleaned with alcohol. Prior to injection, the needle plunger was aspirated to make sure the needle was not within a blood vessel.  There was no blood retrieved on aspiration.   ° °Following is a summary of the muscles injected  And the amount of Botulinum toxin used: ° °Dilution °200 units of Botox was reconstituted with 4 ml of preservative free normal saline. °Time of reconstitution: At the time of the office visit (<30 minutes prior to injection)  ° °Injections  °155 total units of Botox was injected with a 30 gauge needle. ° °Injection Sites: °L occipitalis: 15 units- 3 sites  °R occiptalis: 15 units- 3 sites ° °L upper trapezius: 15 units- 3 sites °R upper trapezius: 15 units- 3 sits          °L paraspinal: 10 units- 2 sites °R paraspinal: 10 units- 2 sites ° °Face °L frontalis(2 injection sites):10 units   °R frontalis(2 injection sites):10 units         °L corrugator: 5 units   °R corrugator: 5 units           °Procerus: 5 units   °L temporalis: 20 units °R temporalis: 20 units  ° °Agent:  °200 units of botulinum Type  A (Onobotulinum Toxin type A) was reconstituted with 4 ml of preservative free normal saline.  °Time of reconstitution: At the time of the office visit (<30 minutes prior to injection)  ° ° ° Total injected (Units):  155 ° Total wasted (Units):  45 ° °Patient tolerated procedure well without complications.   °Reinjection is anticipated in 3 months. ° ° °

## 2020-06-13 LAB — OVARIAN MALIGNANCY RISK-ROMA
Cancer Antigen (CA) 125: 22.9 U/mL (ref 0.0–38.1)
HE4: 99 pmol/L — ABNORMAL HIGH (ref 0.0–96.9)
Postmenopausal ROMA: 2.65
Premenopausal ROMA: 2.96 — ABNORMAL HIGH

## 2020-06-13 LAB — POSTMENOPAUSAL INTERP: LOW

## 2020-06-13 LAB — PREMENOPAUSAL INTERP: HIGH

## 2020-06-15 ENCOUNTER — Other Ambulatory Visit: Payer: Self-pay | Admitting: Obstetrics & Gynecology

## 2020-06-15 DIAGNOSIS — R1900 Intra-abdominal and pelvic swelling, mass and lump, unspecified site: Secondary | ICD-10-CM

## 2020-06-15 NOTE — Progress Notes (Signed)
Surgery Booking Request Patient Full Name:  Shannon Higgins  MRN: [id number]  DOB: 01-04-1935  Surgeon: Hoyt Koch, MD  Requested Surgery Date and Time: March (will require overnight stay so make sure we can do that, my memo says we can in March) Primary Diagnosis AND Code: Ovarian Mass Secondary Diagnosis and Code: R19.00 Surgical Procedure: Laparotomy, BSO RNFA Requested?: No L&D Notification: No Admission Status: surgery admit Length of Surgery: 75 min Special Case Needs: No H&P: Yes Phone Interview???:  Yes Interpreter: No Medical Clearance:  Yes (PCP K Clark) Special Scheduling Instructions: No Any known health/anesthesia issues, diabetes, sleep apnea, latex allergy, defibrillator/pacemaker?: YES - Diabetes Acuity: P3   (P1 highest, P2 delay may cause harm, P3 low, elective gyn, P4 lowest)

## 2020-06-16 ENCOUNTER — Telehealth: Payer: Self-pay

## 2020-06-16 NOTE — Telephone Encounter (Signed)
-----   Message from Gae Dry, MD sent at 06/15/2020  8:48 AM EST ----- Surgery Booking Request Patient Full Name:  Shannon Higgins  MRN: [id number]  DOB: 03-Dec-1934  Surgeon: Hoyt Koch, MD  Requested Surgery Date and Time: March (will require overnight stay so make sure we can do that, my memo says we can in March) Primary Diagnosis AND Code: Ovarian Mass Secondary Diagnosis and Code: R19.00 Surgical Procedure: Laparotomy, BSO RNFA Requested?: No L&D Notification: No Admission Status: surgery admit Length of Surgery: 75 min Special Case Needs: No H&P: Yes Phone Interview???:  Yes Interpreter: No Medical Clearance:  Yes (PCP K Clark) Special Scheduling Instructions: No Any known health/anesthesia issues, diabetes, sleep apnea, latex allergy, defibrillator/pacemaker?: YES - Diabetes Acuity: P3   (P1 highest, P2 delay may cause harm, P3 low, elective gyn, P4 lowest)

## 2020-06-16 NOTE — Telephone Encounter (Signed)
Left a message for the patient to return the call.  

## 2020-06-17 NOTE — Telephone Encounter (Signed)
Rec'd rtn call from [name], patient's daughter, to schedule Laparotomy, BSO w Kenton Kingfisher  DOS 3/15  H&P 3/8 @ 2:50   Covid testing 3/11 @ 8-10:30, [address], drive up and wear mask. Advised pt to quarantine until DOS.  Pre-admit phone call appointment to be requested - date and time will be included on H&P paper work. Also all appointments will be updated on pt MyChart. Explained that this appointment has a call window. Based on the time scheduled will indicate if the call will be received within a 4 hour window before 1:00 or after.  Advised that pt may also receive calls from the hospital pharmacy and pre-service center.  Confirmed pt has [employer] HMO as primary insurance. No secondary insurance.  I told daughter that Kenton Kingfisher has requested medical clearance from Alma Friendly, NP.  Daughter asked if patient could get Covid booster prior to surgery. I adv that I would ask Harris and let her know.

## 2020-06-19 DIAGNOSIS — H401133 Primary open-angle glaucoma, bilateral, severe stage: Secondary | ICD-10-CM | POA: Diagnosis not present

## 2020-07-03 ENCOUNTER — Other Ambulatory Visit: Payer: Self-pay | Admitting: Primary Care

## 2020-07-03 DIAGNOSIS — M792 Neuralgia and neuritis, unspecified: Secondary | ICD-10-CM

## 2020-07-07 ENCOUNTER — Encounter: Payer: Self-pay | Admitting: Obstetrics & Gynecology

## 2020-07-07 ENCOUNTER — Ambulatory Visit (INDEPENDENT_AMBULATORY_CARE_PROVIDER_SITE_OTHER): Payer: Medicare HMO | Admitting: Obstetrics & Gynecology

## 2020-07-07 ENCOUNTER — Other Ambulatory Visit: Payer: Self-pay

## 2020-07-07 ENCOUNTER — Other Ambulatory Visit: Payer: Self-pay | Admitting: Obstetrics & Gynecology

## 2020-07-07 VITALS — BP 122/80 | Ht 64.0 in | Wt 156.0 lb

## 2020-07-07 DIAGNOSIS — R1900 Intra-abdominal and pelvic swelling, mass and lump, unspecified site: Secondary | ICD-10-CM

## 2020-07-07 NOTE — H&P (View-Only) (Signed)
PRE-OPERATIVE HISTORY AND PHYSICAL EXAM  HPI:  Shannon Higgins is a 86 y.o. [id number].  She is being admitted for surgery related to adnexal mass.  Pt has had no symptoms of pelvic pain, bloating or weight changes, however CT done while in hospital for urosepsis, now fully recovered, showed large pelvic mass.  She is a healthy female w no other concerns today.  She has had prior hysterectomy for endometriosis decades ago.  Kept ovaries then.  Prior NSVD prior to that x2.  Prior surgery and therapy (no chemo or RT) for breast cancer.  US- FINDINGS: Uterus Surgically absent. No visible abnormality about the vaginal cuff. Endometrium Surgically absent. Right ovary The native right ovary is not visualized. Left ovary The native left ovary is not visualized. Other findings: Large cystic mass positioned along the midline is seen, corresponding with abnormality on prior CT. Lesion measures approximately 17.0 x 10.7 x 11.9 cm. Lesion is mildly complex with a small internal loculated mural based cystic component, with a few additional scattered low-level internal echoes. Possible focus of mural solid nodularity noted as well (image 18). No appreciable internal vascularity. No visible free fluid.  IMPRESSION: 1. 17.0 x 10.7 x 11.9 cm complex cystic midline pelvic mass, corresponding with abnormality on prior CT. Finding is indeterminate, but could reflect a cystic ovarian neoplasm. Gynecologic referral for further workup and surgical consultation recommended. 2. Nonvisualization of the native ovaries. 3. Status post hysterectomy.  Cancer Antigen (CA) 125 0.0 - 38.1 U/mL 22.9   Comment: [employer] Electrochemiluminescence Immunoassay (ECLIA)  Values obtained with different assay methods or kits cannot be  used interchangeably. Results cannot be interpreted as absolute  evidence of the presence or absence of malignant disease.   HE4 0.0 - 96.9 pmol/L 99.0High   Comment: [employer]  [employer] Electrochemiluminescence Immunoassay (ECLIA)  Values obtained with different assay methods or kits cannot be  used interchangeably. Results cannot be interpreted as absolute  evidence of the presence or absence of malignant disease.    Postmenopausal ROMA See below 2.65 (NORMAL)   PMHx: Past Medical History:  Diagnosis Date  . Acute tubular injury of transplanted kidney ([place]) 06/14/2016  . AKI (acute kidney injury) ([place]) 06/14/2016  . Asthma   . Breast cancer ([place])   . CAP (community acquired pneumonia) 04/29/2012  . Chronic sinusitis   . Confusion 12/06/2016  . Diabetes mellitus without complication ([place])   . GERD (gastroesophageal reflux disease)   . History of ankle fracture 05/14/2018   Last Assessment & Plan:  With a recent fall. Right medial malleous. Has ortho follow up soon, splinted now and tylenol helps her pain  . History of recurrent UTIs   . Hypertension   . Hypokalemia 04/26/2012  . Hyponatremia   . Insomnia   . Migraine   . Neuropathic pain   . Pneumonia   . Rheumatoid arthritis ([place])   . Sepsis ([place]) 04/26/2012  . Sleep apnea   . Stroke ([place])   . Thyroid disease    Past Surgical History:  Procedure Laterality Date  . ABDOMINAL HYSTERECTOMY    . BACK SURGERY    . BREAST BIOPSY Right   . BREAST LUMPECTOMY Left 2015  . CHOLECYSTECTOMY    . KNEE SURGERY    . SHOULDER SURGERY     Family History  Problem Relation Age of Onset  . Diabetes Daughter   . Hypertension Daughter   . Fibromyalgia Daughter   . GER disease Daughter   .  Fibromyalgia Daughter   . Crohn's disease Daughter   . Asthma Daughter   . Hypertension Mother    Social History   Tobacco Use  . Smoking status: Never Smoker  . Smokeless tobacco: Never Used  Vaping Use  . Vaping Use: Never used  Substance Use Topics  . Alcohol use: No  . Drug use: No    Current Outpatient Medications:  .  Accu-Chek FastClix Lancets MISC, USE AS INSTRUCTED TO TEST BLOOD SUGAR DAILY, Disp:  102 each, Rfl: 0 .  ACCU-CHEK GUIDE test strip, USE AS INSTRUCTED TO BLOOD SUGAR DAILY, Disp: 100 strip, Rfl: 0 .  aspirin EC 325 MG tablet, Take 325 mg by mouth daily., Disp: , Rfl:  .  blood glucose meter kit and supplies, Dispense based on patient and insurance preference. Use up to 2 times daily as directed. (FOR ICD-10 E10.9, E11.9)., Disp: 1 each, Rfl: 0 .  BOTOX 200 units SOLR, Inject 1 vial into the muscle every 3 (three) months., Disp: , Rfl:  .  donepezil (ARICEPT) 10 MG tablet, Take 1 tablet daily (Patient taking differently: Take 10 mg by mouth at bedtime. Take 1 tablet daily), Disp: 90 tablet, Rfl: 3 .  dorzolamide-timolol (COSOPT) 22.3-6.8 MG/ML ophthalmic solution, Place 1 drop into both eyes 2 (two) times daily., Disp: , Rfl:  .  DULoxetine (CYMBALTA) 60 MG capsule, TAKE 1 CAPSULE EVERY DAY  FOR  DEPRESSION (Patient taking differently: Take 60 mg by mouth daily.), Disp: 90 capsule, Rfl: 1 .  fluticasone (FLONASE) 50 MCG/ACT nasal spray, Place 1 spray into both nostrils daily., Disp: 48 g, Rfl: 0 .  gabapentin (NEURONTIN) 800 MG tablet, TAKE 1 TABLET BY MOUTH 3 TIMES A DAY FOR NEUROPATHY, Disp: 270 tablet, Rfl: 1 .  latanoprost (XALATAN) 0.005 % ophthalmic solution, Place 1 drop into both eyes at bedtime., Disp: , Rfl:  .  levothyroxine (SYNTHROID) 112 MCG tablet, TAKE 1 TABLET EVERY MORNING ON AN EMPTY STOMACH WITH WATER ONLY.  NO FOOD OR OTHER MEDICATIONS FOR 30 MINUTES. (Patient taking differently: TAKE 1 TABLET EVERY MORNING ON AN EMPTY STOMACH WITH WATER ONLY.  NO FOOD OR OTHER MEDICATIONS FOR 30 MINUTES.), Disp: 90 tablet, Rfl: 1 .  meclizine (ANTIVERT) 25 MG tablet, TAKE 1 TABLET (25 MG TOTAL) BY MOUTH 2 (TWO) TIMES DAILY AS NEEDED FOR DIZZINESS., Disp: 180 tablet, Rfl: 0 .  meloxicam (MOBIC) 15 MG tablet, TAKE 1 TABLET DAILY AS NEEDED FOR PAIN. (Patient taking differently: Take 15 mg by mouth at bedtime.), Disp: 90 tablet, Rfl: 0 .  Netarsudil Dimesylate (RHOPRESSA) 0.02 % SOLN,  Place 1 drop into both eyes daily at 6 (six) AM., Disp: , Rfl:  .  omeprazole (PRILOSEC) 20 MG capsule, TAKE 1 CAPSULE BY MOUTH TWICE DAILY FOR HEARTBURN. (Patient taking differently: Take 20 mg by mouth 2 (two) times daily before a meal. TAKE 1 CAPSULE BY MOUTH TWICE DAILY FOR HEARTBURN.), Disp: 180 capsule, Rfl: 1 .  pilocarpine (PILOCAR) 2 % ophthalmic solution, Place 1 drop into both eyes 4 (four) times daily. , Disp: , Rfl:  .  propranolol (INDERAL) 80 MG tablet, TAKE 1 TABLET ONE TIME DAILY FOR BLOOD PRESSURE (Patient taking differently: Take 80 mg by mouth daily.), Disp: 90 tablet, Rfl: 1 .  Rimegepant Sulfate (NURTEC) 75 MG TBDP, Take 1 tablet by mouth as needed. Take 1 tablet as needed for migraine. Do not take more than 1 in 24 hours., Disp: 10 tablet, Rfl: 5 .  rosuvastatin (CRESTOR) 10 MG  tablet, TAKE 1 TABLET DAILY FOR CHOLESTEROL. (Patient taking differently: Take 10 mg by mouth every evening.), Disp: 90 tablet, Rfl: 1 .  traZODone (DESYREL) 100 MG tablet, Take two tablets at bedtime as needed for sleep (Patient taking differently: Take 200 mg by mouth at bedtime as needed for sleep. Take two tablets at bedtime as needed for sleep), Disp: 180 tablet, Rfl: 0 .  trimethoprim (TRIMPEX) 100 MG tablet, Take 1 tablet (100 mg total) by mouth daily. For recurrent UTI., Disp: 90 tablet, Rfl: 0 Allergies: Patient has no known allergies.  Review of Systems  Constitutional: Negative for chills, fever and malaise/fatigue.  HENT: Negative for congestion, sinus pain and sore throat.   Eyes: Negative for blurred vision and pain.  Respiratory: Negative for cough and wheezing.   Cardiovascular: Negative for chest pain and leg swelling.  Gastrointestinal: Negative for abdominal pain, constipation, diarrhea, heartburn, nausea and vomiting.  Genitourinary: Negative for dysuria, frequency, hematuria and urgency.  Musculoskeletal: Negative for back pain, joint pain, myalgias and neck pain.  Skin: Negative  for itching and rash.  Neurological: Negative for dizziness, tremors and weakness.  Endo/Heme/Allergies: Does not bruise/bleed easily.  Psychiatric/Behavioral: Negative for depression. The patient is not nervous/anxious and does not have insomnia.     Objective: BP 122/80   Ht _0  (1.626 m)   Wt 156 lb (70.8 kg)   BMI 26.78 kg/m   Filed Weights   07/07/20 1445  Weight: 156 lb (70.8 kg)   Physical Exam Constitutional:      General: She is not in acute distress.    Appearance: She is well-developed.  HENT:     Head: Normocephalic and atraumatic. No laceration.     Right Ear: Hearing normal.     Left Ear: Hearing normal.     Mouth/Throat:     Pharynx: Uvula midline.  Eyes:     Pupils: Pupils are equal, round, and reactive to light.  Neck:     Thyroid: No thyromegaly.  Cardiovascular:     Rate and Rhythm: Normal rate and regular rhythm.     Heart sounds: No murmur heard. No friction rub. No gallop.   Pulmonary:     Effort: Pulmonary effort is normal. No respiratory distress.     Breath sounds: Normal breath sounds. No wheezing.  Abdominal:     General: Bowel sounds are normal. There is no distension.     Palpations: Abdomen is soft.     Tenderness: There is no abdominal tenderness. There is no rebound.  Musculoskeletal:        General: Normal range of motion.     Cervical back: Normal range of motion and neck supple.  Neurological:     Mental Status: She is alert and oriented to person, place, and time.     Cranial Nerves: No cranial nerve deficit.  Skin:    General: Skin is warm and dry.  Psychiatric:        Judgment: Judgment normal.  Vitals reviewed.     UA NEG  Assessment: 1. Pelvic mass   Plan laparotomy and BSO to remove mass Low risk cancer based on ROMA  I have had a careful discussion with this patient about all the options available and the risk/benefits of each. I have fully informed this patient that surgery may subject her to a variety of  discomforts and risks: She understands that most patients have surgery with little difficulty, but problems can happen ranging from minor to fatal. These include  nausea, vomiting, pain, bleeding, infection, poor healing, hernia, or formation of adhesions. Unexpected reactions may occur from any drug or anesthetic given. Unintended injury may occur to other pelvic or abdominal structures such as Fallopian tubes, ovaries, bladder, ureter (tube from kidney to bladder), or bowel. Nerves going from the pelvis to the legs may be injured. Any such injury may require immediate or later additional surgery to correct the problem. Excessive blood loss requiring transfusion is very unlikely but possible. Dangerous blood clots may form in the legs or lungs. Physical and sexual activity will be restricted in varying degrees for an indeterminate period of time but most often 2-6 weeks.  Finally, she understands that it is impossible to list every possible undesirable effect and that the condition for which surgery is done is not always cured or significantly improved, and in rare cases may be even worse.Ample time was given to answer all questions.  Barnett Applebaum, MD, [facility], [facility] 07/07/2020  2:56 PM

## 2020-07-07 NOTE — Progress Notes (Signed)
PRE-OPERATIVE HISTORY AND PHYSICAL EXAM  HPI:  Shannon Higgins is a 86 y.o. [id number].  She is being admitted for surgery related to adnexal mass.  Pt has had no symptoms of pelvic pain, bloating or weight changes, however CT done while in hospital for urosepsis, now fully recovered, showed large pelvic mass.  She is a healthy female w no other concerns today.  She has had prior hysterectomy for endometriosis decades ago.  Kept ovaries then.  Prior NSVD prior to that x2.  Prior surgery and therapy (no chemo or RT) for breast cancer.  US- FINDINGS: Uterus Surgically absent. No visible abnormality about the vaginal cuff. Endometrium Surgically absent. Right ovary The native right ovary is not visualized. Left ovary The native left ovary is not visualized. Other findings: Large cystic mass positioned along the midline is seen, corresponding with abnormality on prior CT. Lesion measures approximately 17.0 x 10.7 x 11.9 cm. Lesion is mildly complex with a small internal loculated mural based cystic component, with a few additional scattered low-level internal echoes. Possible focus of mural solid nodularity noted as well (image 18). No appreciable internal vascularity. No visible free fluid.  IMPRESSION: 1. 17.0 x 10.7 x 11.9 cm complex cystic midline pelvic mass, corresponding with abnormality on prior CT. Finding is indeterminate, but could reflect a cystic ovarian neoplasm. Gynecologic referral for further workup and surgical consultation recommended. 2. Nonvisualization of the native ovaries. 3. Status post hysterectomy.  Cancer Antigen (CA) 125 0.0 - 38.1 U/mL 22.9   Comment: [employer] Electrochemiluminescence Immunoassay (ECLIA)  Values obtained with different assay methods or kits cannot be  used interchangeably. Results cannot be interpreted as absolute  evidence of the presence or absence of malignant disease.   HE4 0.0 - 96.9 pmol/L 99.0High   Comment: [employer]  [employer] Electrochemiluminescence Immunoassay (ECLIA)  Values obtained with different assay methods or kits cannot be  used interchangeably. Results cannot be interpreted as absolute  evidence of the presence or absence of malignant disease.    Postmenopausal ROMA See below 2.65 (NORMAL)   PMHx: Past Medical History:  Diagnosis Date  . Acute tubular injury of transplanted kidney ([place]) 06/14/2016  . AKI (acute kidney injury) ([place]) 06/14/2016  . Asthma   . Breast cancer ([place])   . CAP (community acquired pneumonia) 04/29/2012  . Chronic sinusitis   . Confusion 12/06/2016  . Diabetes mellitus without complication ([place])   . GERD (gastroesophageal reflux disease)   . History of ankle fracture 05/14/2018   Last Assessment & Plan:  With a recent fall. Right medial malleous. Has ortho follow up soon, splinted now and tylenol helps her pain  . History of recurrent UTIs   . Hypertension   . Hypokalemia 04/26/2012  . Hyponatremia   . Insomnia   . Migraine   . Neuropathic pain   . Pneumonia   . Rheumatoid arthritis ([place])   . Sepsis ([place]) 04/26/2012  . Sleep apnea   . Stroke ([place])   . Thyroid disease    Past Surgical History:  Procedure Laterality Date  . ABDOMINAL HYSTERECTOMY    . BACK SURGERY    . BREAST BIOPSY Right   . BREAST LUMPECTOMY Left 2015  . CHOLECYSTECTOMY    . KNEE SURGERY    . SHOULDER SURGERY     Family History  Problem Relation Age of Onset  . Diabetes Daughter   . Hypertension Daughter   . Fibromyalgia Daughter   . GER disease Daughter   .  Fibromyalgia Daughter   . Crohn's disease Daughter   . Asthma Daughter   . Hypertension Mother    Social History   Tobacco Use  . Smoking status: Never Smoker  . Smokeless tobacco: Never Used  Vaping Use  . Vaping Use: Never used  Substance Use Topics  . Alcohol use: No  . Drug use: No    Current Outpatient Medications:  .  Accu-Chek FastClix Lancets MISC, USE AS INSTRUCTED TO TEST BLOOD SUGAR DAILY, Disp:  102 each, Rfl: 0 .  ACCU-CHEK GUIDE test strip, USE AS INSTRUCTED TO BLOOD SUGAR DAILY, Disp: 100 strip, Rfl: 0 .  aspirin EC 325 MG tablet, Take 325 mg by mouth daily., Disp: , Rfl:  .  blood glucose meter kit and supplies, Dispense based on patient and insurance preference. Use up to 2 times daily as directed. (FOR ICD-10 E10.9, E11.9)., Disp: 1 each, Rfl: 0 .  BOTOX 200 units SOLR, Inject 1 vial into the muscle every 3 (three) months., Disp: , Rfl:  .  donepezil (ARICEPT) 10 MG tablet, Take 1 tablet daily (Patient taking differently: Take 10 mg by mouth at bedtime. Take 1 tablet daily), Disp: 90 tablet, Rfl: 3 .  dorzolamide-timolol (COSOPT) 22.3-6.8 MG/ML ophthalmic solution, Place 1 drop into both eyes 2 (two) times daily., Disp: , Rfl:  .  DULoxetine (CYMBALTA) 60 MG capsule, TAKE 1 CAPSULE EVERY DAY  FOR  DEPRESSION (Patient taking differently: Take 60 mg by mouth daily.), Disp: 90 capsule, Rfl: 1 .  fluticasone (FLONASE) 50 MCG/ACT nasal spray, Place 1 spray into both nostrils daily., Disp: 48 g, Rfl: 0 .  gabapentin (NEURONTIN) 800 MG tablet, TAKE 1 TABLET BY MOUTH 3 TIMES A DAY FOR NEUROPATHY, Disp: 270 tablet, Rfl: 1 .  latanoprost (XALATAN) 0.005 % ophthalmic solution, Place 1 drop into both eyes at bedtime., Disp: , Rfl:  .  levothyroxine (SYNTHROID) 112 MCG tablet, TAKE 1 TABLET EVERY MORNING ON AN EMPTY STOMACH WITH WATER ONLY.  NO FOOD OR OTHER MEDICATIONS FOR 30 MINUTES. (Patient taking differently: TAKE 1 TABLET EVERY MORNING ON AN EMPTY STOMACH WITH WATER ONLY.  NO FOOD OR OTHER MEDICATIONS FOR 30 MINUTES.), Disp: 90 tablet, Rfl: 1 .  meclizine (ANTIVERT) 25 MG tablet, TAKE 1 TABLET (25 MG TOTAL) BY MOUTH 2 (TWO) TIMES DAILY AS NEEDED FOR DIZZINESS., Disp: 180 tablet, Rfl: 0 .  meloxicam (MOBIC) 15 MG tablet, TAKE 1 TABLET DAILY AS NEEDED FOR PAIN. (Patient taking differently: Take 15 mg by mouth at bedtime.), Disp: 90 tablet, Rfl: 0 .  Netarsudil Dimesylate (RHOPRESSA) 0.02 % SOLN,  Place 1 drop into both eyes daily at 6 (six) AM., Disp: , Rfl:  .  omeprazole (PRILOSEC) 20 MG capsule, TAKE 1 CAPSULE BY MOUTH TWICE DAILY FOR HEARTBURN. (Patient taking differently: Take 20 mg by mouth 2 (two) times daily before a meal. TAKE 1 CAPSULE BY MOUTH TWICE DAILY FOR HEARTBURN.), Disp: 180 capsule, Rfl: 1 .  pilocarpine (PILOCAR) 2 % ophthalmic solution, Place 1 drop into both eyes 4 (four) times daily. , Disp: , Rfl:  .  propranolol (INDERAL) 80 MG tablet, TAKE 1 TABLET ONE TIME DAILY FOR BLOOD PRESSURE (Patient taking differently: Take 80 mg by mouth daily.), Disp: 90 tablet, Rfl: 1 .  Rimegepant Sulfate (NURTEC) 75 MG TBDP, Take 1 tablet by mouth as needed. Take 1 tablet as needed for migraine. Do not take more than 1 in 24 hours., Disp: 10 tablet, Rfl: 5 .  rosuvastatin (CRESTOR) 10 MG  tablet, TAKE 1 TABLET DAILY FOR CHOLESTEROL. (Patient taking differently: Take 10 mg by mouth every evening.), Disp: 90 tablet, Rfl: 1 .  traZODone (DESYREL) 100 MG tablet, Take two tablets at bedtime as needed for sleep (Patient taking differently: Take 200 mg by mouth at bedtime as needed for sleep. Take two tablets at bedtime as needed for sleep), Disp: 180 tablet, Rfl: 0 .  trimethoprim (TRIMPEX) 100 MG tablet, Take 1 tablet (100 mg total) by mouth daily. For recurrent UTI., Disp: 90 tablet, Rfl: 0 Allergies: Patient has no known allergies.  Review of Systems  Constitutional: Negative for chills, fever and malaise/fatigue.  HENT: Negative for congestion, sinus pain and sore throat.   Eyes: Negative for blurred vision and pain.  Respiratory: Negative for cough and wheezing.   Cardiovascular: Negative for chest pain and leg swelling.  Gastrointestinal: Negative for abdominal pain, constipation, diarrhea, heartburn, nausea and vomiting.  Genitourinary: Negative for dysuria, frequency, hematuria and urgency.  Musculoskeletal: Negative for back pain, joint pain, myalgias and neck pain.  Skin: Negative  for itching and rash.  Neurological: Negative for dizziness, tremors and weakness.  Endo/Heme/Allergies: Does not bruise/bleed easily.  Psychiatric/Behavioral: Negative for depression. The patient is not nervous/anxious and does not have insomnia.     Objective: BP 122/80   Ht _0  (1.626 m)   Wt 156 lb (70.8 kg)   BMI 26.78 kg/m   Filed Weights   07/07/20 1445  Weight: 156 lb (70.8 kg)   Physical Exam Constitutional:      General: She is not in acute distress.    Appearance: She is well-developed.  HENT:     Head: Normocephalic and atraumatic. No laceration.     Right Ear: Hearing normal.     Left Ear: Hearing normal.     Mouth/Throat:     Pharynx: Uvula midline.  Eyes:     Pupils: Pupils are equal, round, and reactive to light.  Neck:     Thyroid: No thyromegaly.  Cardiovascular:     Rate and Rhythm: Normal rate and regular rhythm.     Heart sounds: No murmur heard. No friction rub. No gallop.   Pulmonary:     Effort: Pulmonary effort is normal. No respiratory distress.     Breath sounds: Normal breath sounds. No wheezing.  Abdominal:     General: Bowel sounds are normal. There is no distension.     Palpations: Abdomen is soft.     Tenderness: There is no abdominal tenderness. There is no rebound.  Musculoskeletal:        General: Normal range of motion.     Cervical back: Normal range of motion and neck supple.  Neurological:     Mental Status: She is alert and oriented to person, place, and time.     Cranial Nerves: No cranial nerve deficit.  Skin:    General: Skin is warm and dry.  Psychiatric:        Judgment: Judgment normal.  Vitals reviewed.     UA NEG  Assessment: 1. Pelvic mass   Plan laparotomy and BSO to remove mass Low risk cancer based on ROMA  I have had a careful discussion with this patient about all the options available and the risk/benefits of each. I have fully informed this patient that surgery may subject her to a variety of  discomforts and risks: She understands that most patients have surgery with little difficulty, but problems can happen ranging from minor to fatal. These include  nausea, vomiting, pain, bleeding, infection, poor healing, hernia, or formation of adhesions. Unexpected reactions may occur from any drug or anesthetic given. Unintended injury may occur to other pelvic or abdominal structures such as Fallopian tubes, ovaries, bladder, ureter (tube from kidney to bladder), or bowel. Nerves going from the pelvis to the legs may be injured. Any such injury may require immediate or later additional surgery to correct the problem. Excessive blood loss requiring transfusion is very unlikely but possible. Dangerous blood clots may form in the legs or lungs. Physical and sexual activity will be restricted in varying degrees for an indeterminate period of time but most often 2-6 weeks.  Finally, she understands that it is impossible to list every possible undesirable effect and that the condition for which surgery is done is not always cured or significantly improved, and in rare cases may be even worse.Ample time was given to answer all questions.  Barnett Applebaum, MD, [facility], [facility] 07/07/2020  2:56 PM

## 2020-07-07 NOTE — Patient Instructions (Signed)
PRE ADMISSION TESTING For Covid, prior to procedure Friday 9:00-10:00 [facility] entrance (drive up)  Results in 48-72 hours You will not receive notification if test results are negative. If positive for Covid19, your provider will notify you by phone, with additional instructions.   Exploratory Laparotomy, Adult, Care After The following information offers guidance on how to care for yourself after your procedure. Your health care provider may also give you more specific instructions. If you have problems or questions, contact your health care provider. What can I expect after the procedure? After the procedure, it is common to have:  Abdominal soreness.  Fatigue.  Bloating.  Gas.  A sore throat from having had a breathing or draining tube in your throat.  A lack of appetite. Follow these instructions at home: Medicines  Take over-the-counter and prescription medicines only as told by your health care provider.  If you were prescribed an antibiotic medicine, take it as told by your health care provider. Do not stop taking the antibiotic even if you start to feel better.  Ask your health care provider if the medicine prescribed to you: ? Requires you to avoid driving or using machinery. ? Can cause constipation. You may need to take these actions to prevent or treat constipation:  Drink enough fluid to keep your urine pale yellow.  Take over-the-counter or prescription medicines. Undergoing surgery and taking pain medicines can make constipation worse.  Eat foods that are high in fiber, such as beans, whole grains, and fresh fruits and vegetables.  Limit foods that are high in fat and processed sugars, such as fried or sweet foods. Incision care  Follow instructions from your health care provider about how to take care of your incision. Make sure you: ? Wash your hands with soap and water for at least 20 seconds before and after you change your bandage  (dressing). If soap and water are not available, use hand sanitizer. ? Change your dressing as told by your health care provider. ? Leave stitches (sutures), skin glue, or adhesive strips in place. These skin closures may need to stay in place for 2 weeks or longer. If adhesive strip edges start to loosen and curl up, you may trim the loose edges. Do not remove adhesive strips completely unless your health care provider tells you to do that.  If you were sent home with a drain, follow instructions from your health care provider about how to care for it.  Check your incision area every day for signs of infection. Check for: ? Redness, swelling, or pain. ? Fluid or blood. ? Warmth. ? Pus or a bad smell.   Activity  Rest as told by your health care provider.  Avoid sitting for a long time without moving. Get up to take short walks every 1-2 hours. This is important to improve blood flow and breathing. Ask for help if you feel weak or unsteady.  Do not lift anything that is heavier than 5 lb (2.3 kg), or the limit that you are told, until your health care provider says that it is safe.  Return to your normal activities as told by your health care provider. Ask your health care provider what activities are safe for you.   Bathing Keep your incision clean and dry. Clean it as often as told by your health care provider. You may be told to:  Gently wash the incision with soap and water.  Rinse the incision with water to remove all soap.  Pat the incision dry with a clean towel. Do not rub the incision. General instructions  Do not use any products that contain nicotine or tobacco. These products include cigarettes, chewing tobacco, and vaping devices, such as e-cigarettes. These can delay incision healing after surgery. If you need help quitting, ask your health care provider.  Wear compression stockings as told by your health care provider. These stockings help to prevent blood clots and  reduce swelling in your legs.  Keep all follow-up visits. This is important. Contact a health care provider if:  You have a fever or chills.  Your pain medicine is not helping.  You have constipation or diarrhea.  You have nausea or vomiting.  You have drainage, redness, swelling, or pain at your incision site. Get help right away if:  Your pain is getting worse.  You have not had a bowel movement for more than 3 days.  You have ongoing (persistent) vomiting.  The edges of your incision open up.  You have warmth, tenderness, or swelling in your calf.  You have trouble breathing.  You have chest pain. These symptoms may represent a serious problem that is an emergency. Do not wait to see if the symptoms will go away. Get medical help right away. Call your local emergency services (911 in the [place] Do not drive yourself to the hospital. Summary  Abdominal soreness is common after exploratory laparotomy. Take over-the-counter and prescription medicines only as told by your health care provider.  Follow instructions from your health care provider about how to take care of your incision.  Do not lift anything that is heavier than 5 lb (2.3 kg), or the limit that you are told, until your health care provider says that it is safe. This information is not intended to replace advice given to you by your health care provider. Make sure you discuss any questions you have with your health care provider. Document Revised: 12/31/2019 Document Reviewed: 12/31/2019 Elsevier Patient Education  2021 [employer].

## 2020-07-08 ENCOUNTER — Other Ambulatory Visit
Admission: RE | Admit: 2020-07-08 | Discharge: 2020-07-08 | Disposition: A | Discharge status: Home / Self Care | Payer: Medicare HMO | Source: Ambulatory Visit | Attending: Obstetrics & Gynecology | Admitting: Obstetrics & Gynecology

## 2020-07-08 HISTORY — DX: Bronchitis, not specified as acute or chronic: J40

## 2020-07-08 HISTORY — DX: Hypothyroidism, unspecified: E03.9

## 2020-07-08 NOTE — Patient Instructions (Addendum)
Your procedure is scheduled on:  Tuesday, March 15 Report to the Registration Desk on the 1st floor of the [facility]. To find out your arrival time, please call [phone] between 1PM - 3PM on: Monday, March 14  REMEMBER: Instructions that are not followed completely may result in serious medical risk, up to and including death; or upon the discretion of your surgeon and anesthesiologist your surgery may need to be rescheduled.  Do not eat food after midnight the night before surgery.  No gum chewing, lozengers or hard candies.  You may however, drink water up to 2 hours before you are scheduled to arrive for your surgery. Do not drink anything within 2 hours of your scheduled arrival time.  TAKE THESE MEDICATIONS THE MORNING OF SURGERY WITH A SIP OF WATER:  1.  Dorzolamide-timolol (Cosopt) eye drops  2.  Duloxetine (Cymbalta) 3.  flonase nasal spray 4.  Gabapentin 5.  Levothyroxine 6.  Omeprazole - (take one the night before and one on the morning of surgery - helps to prevent nausea after surgery.) 7.  Pilocarpine eye drops 8.  Propranolol 9.  netarsudil dimesylate eye drop  One week prior to surgery: starting today, March 9 Stop ASPIRIN, MELOXICAM, Anti-inflammatories (NSAIDS) such as Advil, Aleve, Ibuprofen, Motrin, Naproxen, Naprosyn and Aspirin based products such as Excedrin, Goodys Powder, BC Powder. Stop ANY OVER THE COUNTER supplements until after surgery.  No Alcohol for 24 hours before or after surgery.  On the morning of surgery brush your teeth with toothpaste and water, you may rinse your mouth with mouthwash if you wish. Do not swallow any toothpaste or mouthwash.  Do not wear jewelry, make-up, hairpins, clips or nail polish.  Do not wear lotions, powders, or perfumes.   Do not shave body from the neck down 48 hours prior to surgery just in case you cut yourself which could leave a site for infection.  Also, freshly shaved skin may become irritated if  using the CHG soap.  Contact lenses, hearing aids and dentures may not be worn into surgery.  Do not bring valuables to the hospital. [facility] is not responsible for any missing/lost belongings or valuables.   Use CHG Soap as directed on instruction sheet.  Notify your doctor if there is any change in your medical condition (cold, fever, infection).  Wear comfortable clothing (specific to your surgery type) to the hospital.  Plan for stool softeners for home use; pain medications have a tendency to cause constipation. You can also help prevent constipation by eating foods high in fiber such as fruits and vegetables and drinking plenty of fluids as your diet allows.  After surgery, you can help prevent lung complications by doing breathing exercises.  Take deep breaths and cough every 1-2 hours. Your doctor may order a device called an Incentive Spirometer to help you take deep breaths. When coughing or sneezing, hold a pillow firmly against your incision with both hands. This is called "splinting." Doing this helps protect your incision. It also decreases belly discomfort.  If you are being admitted to the hospital overnight, leave your suitcase in the car. After surgery it may be brought to your room.  If you are being discharged the day of surgery, you will not be allowed to drive home. You will need a responsible adult (18 years or older) to drive you home and stay with you that night.   If you are taking public transportation, you will need to have a responsible adult (  18 years or older) with you. Please confirm with your physician that it is acceptable to use public transportation.   Please call the [facility] at [phone] if you have any questions about these instructions.  Surgery Visitation Policy:  Patients undergoing a surgery or procedure may have one family member or support person with them as long as that person is not COVID-19 positive or  experiencing its symptoms.  That person may remain in the waiting area during the procedure.  Inpatient Visitation:    Visiting hours are 7 a.m. to 8 p.m. Inpatients will be allowed two visitors daily. The visitors may change each day during the patient's stay. No visitors under the age of 48. Any visitor under the age of 20 must be accompanied by an adult. The visitor must pass COVID-19 screenings, use hand sanitizer when entering and exiting the patient's room and wear a mask at all times, including in the patient's room. Patients must also wear a mask when staff or their visitor are in the room. Masking is required regardless of vaccination status.

## 2020-07-10 ENCOUNTER — Other Ambulatory Visit: Payer: Self-pay

## 2020-07-10 ENCOUNTER — Other Ambulatory Visit: Payer: Medicare HMO

## 2020-07-10 ENCOUNTER — Encounter
Admission: RE | Admit: 2020-07-10 | Discharge: 2020-07-10 | Disposition: A | Discharge status: Home / Self Care | Payer: Medicare HMO | Source: Ambulatory Visit | Attending: Obstetrics & Gynecology | Admitting: Obstetrics & Gynecology

## 2020-07-10 DIAGNOSIS — Z20822 Contact with and (suspected) exposure to covid-19: Secondary | ICD-10-CM | POA: Insufficient documentation

## 2020-07-10 DIAGNOSIS — Z01812 Encounter for preprocedural laboratory examination: Secondary | ICD-10-CM | POA: Insufficient documentation

## 2020-07-10 LAB — TYPE AND SCREEN
ABO/RH(D): O POS
Antibody Screen: NEGATIVE

## 2020-07-10 LAB — COMPREHENSIVE METABOLIC PANEL
ALT: 11 U/L (ref 0–44)
AST: 20 U/L (ref 15–41)
Albumin: 4.2 g/dL (ref 3.5–5.0)
Alkaline Phosphatase: 78 U/L (ref 38–126)
Anion gap: 8 (ref 5–15)
BUN: 18 mg/dL (ref 8–23)
CO2: 26 mmol/L (ref 22–32)
Calcium: 9.4 mg/dL (ref 8.9–10.3)
Chloride: 105 mmol/L (ref 98–111)
Creatinine, Ser: 0.91 mg/dL (ref 0.44–1.00)
GFR, Estimated: >60 mL/min (ref >60)
Glucose, Bld: 144 mg/dL — ABNORMAL HIGH (ref 70–99)
Potassium: 4 mmol/L (ref 3.5–5.1)
Sodium: 139 mmol/L (ref 135–145)
Total Bilirubin: 0.5 mg/dL (ref 0.3–1.2)
Total Protein: 7.7 g/dL (ref 6.5–8.1)

## 2020-07-10 LAB — CBC
HCT: 42.4 % (ref 36.0–46.0)
Hemoglobin: 13.2 g/dL (ref 12.0–15.0)
MCH: 28.1 pg (ref 26.0–34.0)
MCHC: 31.1 g/dL (ref 30.0–36.0)
MCV: 90.2 fL (ref 80.0–100.0)
Platelets: 292 10*3/uL (ref 150–400)
RBC: 4.7 MIL/uL (ref 3.87–5.11)
RDW: 16 % — ABNORMAL HIGH (ref 11.5–15.5)
WBC: 9.2 10*3/uL (ref 4.0–10.5)
nRBC: 0 % (ref 0.0–0.2)

## 2020-07-10 LAB — PROTIME-INR
INR: 0.9 (ref 0.8–1.2)
Prothrombin Time: 12.2 seconds (ref 11.4–15.2)

## 2020-07-10 LAB — APTT: aPTT: 27 seconds (ref 24–36)

## 2020-07-11 LAB — SARS CORONAVIRUS 2 (TAT 6-24 HRS): SARS Coronavirus 2: NEGATIVE

## 2020-07-13 ENCOUNTER — Other Ambulatory Visit: Payer: Self-pay | Admitting: Neurology

## 2020-07-13 DIAGNOSIS — F03A0 Unspecified dementia, mild, without behavioral disturbance, psychotic disturbance, mood disturbance, and anxiety: Secondary | ICD-10-CM

## 2020-07-13 DIAGNOSIS — F0390 Unspecified dementia without behavioral disturbance: Secondary | ICD-10-CM

## 2020-07-13 MED ORDER — POVIDONE-IODINE 10 % EX SWAB: 2.0000 "application " | Freq: Once | CUTANEOUS | Status: DC

## 2020-07-13 MED ORDER — SODIUM CHLORIDE 0.9 % IV SOLN
2.0000 g | INTRAVENOUS | Status: AC
  Administered 2020-07-14: 2 g via INTRAVENOUS

## 2020-07-13 MED ORDER — LACTATED RINGERS IV SOLN: INTRAVENOUS | Status: DC

## 2020-07-13 MED ORDER — SODIUM CHLORIDE 0.9 % IV SOLN: INTRAVENOUS | Status: DC

## 2020-07-13 MED ORDER — CHLORHEXIDINE GLUCONATE 0.12 % MT SOLN
15.0000 mL | Freq: Once | OROMUCOSAL | Status: AC
  Administered 2020-07-14: 15 mL via OROMUCOSAL

## 2020-07-13 MED ORDER — ORAL CARE MOUTH RINSE: 15.0000 mL | Freq: Once | OROMUCOSAL | Status: AC

## 2020-07-14 ENCOUNTER — Other Ambulatory Visit: Payer: Self-pay

## 2020-07-14 ENCOUNTER — Observation Stay: Payer: Medicare HMO | Admitting: Certified Registered Nurse Anesthetist

## 2020-07-14 ENCOUNTER — Encounter
Admission: RE | Disposition: A | Discharge status: Home / Self Care | Payer: Self-pay | Source: Home / Self Care | Attending: Obstetrics & Gynecology

## 2020-07-14 ENCOUNTER — Encounter: Payer: Self-pay | Admitting: Obstetrics & Gynecology

## 2020-07-14 ENCOUNTER — Inpatient Hospital Stay
Admission: RE | Admit: 2020-07-14 | Discharge: 2020-07-15 | DRG: 738 | Disposition: A | Discharge status: Home / Self Care | Payer: Medicare HMO | Attending: Obstetrics & Gynecology | Admitting: Obstetrics & Gynecology

## 2020-07-14 DIAGNOSIS — Z853 Personal history of malignant neoplasm of breast: Secondary | ICD-10-CM | POA: Diagnosis not present

## 2020-07-14 DIAGNOSIS — C562 Malignant neoplasm of left ovary: Secondary | ICD-10-CM | POA: Diagnosis not present

## 2020-07-14 DIAGNOSIS — N83311 Acquired atrophy of right ovary: Secondary | ICD-10-CM | POA: Diagnosis not present

## 2020-07-14 DIAGNOSIS — Z79890 Hormone replacement therapy (postmenopausal): Secondary | ICD-10-CM | POA: Diagnosis not present

## 2020-07-14 DIAGNOSIS — K219 Gastro-esophageal reflux disease without esophagitis: Secondary | ICD-10-CM | POA: Diagnosis not present

## 2020-07-14 DIAGNOSIS — Z87440 Personal history of urinary (tract) infections: Secondary | ICD-10-CM | POA: Diagnosis not present

## 2020-07-14 DIAGNOSIS — C561 Malignant neoplasm of right ovary: Principal | ICD-10-CM | POA: Diagnosis present

## 2020-07-14 DIAGNOSIS — Z7982 Long term (current) use of aspirin: Secondary | ICD-10-CM | POA: Diagnosis not present

## 2020-07-14 DIAGNOSIS — E1140 Type 2 diabetes mellitus with diabetic neuropathy, unspecified: Secondary | ICD-10-CM | POA: Diagnosis not present

## 2020-07-14 DIAGNOSIS — Z79899 Other long term (current) drug therapy: Secondary | ICD-10-CM

## 2020-07-14 DIAGNOSIS — E785 Hyperlipidemia, unspecified: Secondary | ICD-10-CM | POA: Diagnosis not present

## 2020-07-14 DIAGNOSIS — N838 Other noninflammatory disorders of ovary, fallopian tube and broad ligament: Secondary | ICD-10-CM | POA: Diagnosis present

## 2020-07-14 DIAGNOSIS — Z8673 Personal history of transient ischemic attack (TIA), and cerebral infarction without residual deficits: Secondary | ICD-10-CM | POA: Diagnosis not present

## 2020-07-14 DIAGNOSIS — R1900 Intra-abdominal and pelvic swelling, mass and lump, unspecified site: Secondary | ICD-10-CM | POA: Diagnosis present

## 2020-07-14 DIAGNOSIS — I10 Essential (primary) hypertension: Secondary | ICD-10-CM | POA: Diagnosis not present

## 2020-07-14 DIAGNOSIS — R12 Heartburn: Secondary | ICD-10-CM | POA: Diagnosis present

## 2020-07-14 DIAGNOSIS — C563 Malignant neoplasm of bilateral ovaries: Secondary | ICD-10-CM | POA: Diagnosis not present

## 2020-07-14 DIAGNOSIS — Z90710 Acquired absence of both cervix and uterus: Secondary | ICD-10-CM | POA: Diagnosis not present

## 2020-07-14 HISTORY — PX: LAPAROTOMY: SHX154

## 2020-07-14 HISTORY — DX: Other noninflammatory disorders of ovary, fallopian tube and broad ligament: N83.8

## 2020-07-14 HISTORY — PX: SALPINGOOPHORECTOMY: SHX82

## 2020-07-14 LAB — GLUCOSE, CAPILLARY
Glucose-Capillary: 135 mg/dL — ABNORMAL HIGH (ref 70–99)
Glucose-Capillary: 128 mg/dL — ABNORMAL HIGH (ref 70–99)
Glucose-Capillary: 130 mg/dL — ABNORMAL HIGH (ref 70–99)
Glucose-Capillary: 178 mg/dL — ABNORMAL HIGH (ref 70–99)
Glucose-Capillary: 200 mg/dL — ABNORMAL HIGH (ref 70–99)

## 2020-07-14 LAB — HEMOGLOBIN A1C
Hgb A1c MFr Bld: 6.8 % — ABNORMAL HIGH (ref 4.8–5.6)
Mean Plasma Glucose: 148.46 mg/dL

## 2020-07-14 SURGERY — SALPINGO-OOPHORECTOMY, OPEN
Anesthesia: General

## 2020-07-14 MED ORDER — IBUPROFEN 800 MG PO TABS
800.0000 mg | ORAL_TABLET | Freq: Four times a day (QID) | ORAL | Status: DC
  Administered 2020-07-15: 800 mg via ORAL
  Filled 2020-07-14 (×2): qty 1

## 2020-07-14 MED ORDER — FENTANYL CITRATE (PF) 100 MCG/2ML IJ SOLN
INTRAMUSCULAR | Status: DC | PRN
  Administered 2020-07-14: 50 ug via INTRAVENOUS

## 2020-07-14 MED ORDER — FENTANYL CITRATE (PF) 100 MCG/2ML IJ SOLN
INTRAMUSCULAR | Status: AC
  Administered 2020-07-14: 25 ug via INTRAVENOUS
  Filled 2020-07-14: qty 2

## 2020-07-14 MED ORDER — BUPIVACAINE HCL (PF) 0.5 % IJ SOLN
INTRAMUSCULAR | Status: DC | PRN
  Administered 2020-07-14: 18 mL

## 2020-07-14 MED ORDER — SODIUM CHLORIDE 0.9 % IV SOLN
INTRAVENOUS | Status: AC
  Filled 2020-07-14: qty 2

## 2020-07-14 MED ORDER — ACETAMINOPHEN 325 MG PO TABS
650.0000 mg | ORAL_TABLET | ORAL | Status: DC | PRN
  Administered 2020-07-14: 650 mg via ORAL
  Filled 2020-07-14: qty 2

## 2020-07-14 MED ORDER — DULOXETINE HCL 60 MG PO CPEP
60.0000 mg | ORAL_CAPSULE | Freq: Every day | ORAL | Status: DC
  Administered 2020-07-14 – 2020-07-15 (×2): 60 mg via ORAL
  Filled 2020-07-14 (×3): qty 1

## 2020-07-14 MED ORDER — MORPHINE SULFATE (PF) 2 MG/ML IV SOLN
1.0000 mg | INTRAVENOUS | Status: DC | PRN
  Administered 2020-07-14: 2 mg via INTRAVENOUS
  Filled 2020-07-14: qty 1

## 2020-07-14 MED ORDER — ROCURONIUM BROMIDE 100 MG/10ML IV SOLN
INTRAVENOUS | Status: DC | PRN
  Administered 2020-07-14: 40 mg via INTRAVENOUS
  Administered 2020-07-14: 10 mg via INTRAVENOUS

## 2020-07-14 MED ORDER — BISACODYL 10 MG RE SUPP: 10.0000 mg | Freq: Every day | RECTAL | Status: DC | PRN

## 2020-07-14 MED ORDER — SENNOSIDES-DOCUSATE SODIUM 8.6-50 MG PO TABS
1.0000 | ORAL_TABLET | Freq: Every evening | ORAL | Status: DC | PRN
Start: 2020-07-14 — End: 2020-07-15

## 2020-07-14 MED ORDER — SODIUM CHLORIDE (PF) 0.9 % IJ SOLN
INTRAMUSCULAR | Status: AC
  Filled 2020-07-14: qty 20

## 2020-07-14 MED ORDER — DONEPEZIL HCL 5 MG PO TABS
10.0000 mg | ORAL_TABLET | Freq: Every day | ORAL | Status: DC
  Administered 2020-07-14: 10 mg via ORAL
  Filled 2020-07-14 (×2): qty 2

## 2020-07-14 MED ORDER — EPHEDRINE SULFATE 50 MG/ML IJ SOLN
INTRAMUSCULAR | Status: DC | PRN
  Administered 2020-07-14: 15 mg via INTRAVENOUS
  Administered 2020-07-14 (×2): 10 mg via INTRAVENOUS
  Administered 2020-07-14: 15 mg via INTRAVENOUS

## 2020-07-14 MED ORDER — CHLORHEXIDINE GLUCONATE 0.12 % MT SOLN
OROMUCOSAL | Status: AC
  Filled 2020-07-14: qty 15

## 2020-07-14 MED ORDER — EPHEDRINE 5 MG/ML INJ
INTRAVENOUS | Status: AC
  Filled 2020-07-14: qty 10

## 2020-07-14 MED ORDER — ONDANSETRON HCL 4 MG/2ML IJ SOLN
INTRAMUSCULAR | Status: AC
  Filled 2020-07-14: qty 2

## 2020-07-14 MED ORDER — VASOPRESSIN 20 UNIT/ML IV SOLN
INTRAVENOUS | Status: AC
  Filled 2020-07-14: qty 1

## 2020-07-14 MED ORDER — PROPRANOLOL HCL 40 MG PO TABS
80.0000 mg | ORAL_TABLET | Freq: Every day | ORAL | Status: DC
  Filled 2020-07-14 (×2): qty 2

## 2020-07-14 MED ORDER — SIMETHICONE 80 MG PO CHEW: 80.0000 mg | CHEWABLE_TABLET | Freq: Four times a day (QID) | ORAL | Status: DC | PRN

## 2020-07-14 MED ORDER — DEXAMETHASONE SODIUM PHOSPHATE 10 MG/ML IJ SOLN
INTRAMUSCULAR | Status: AC
  Filled 2020-07-14: qty 1

## 2020-07-14 MED ORDER — PROPOFOL 10 MG/ML IV BOLUS
INTRAVENOUS | Status: DC | PRN
  Administered 2020-07-14: 110 mg via INTRAVENOUS

## 2020-07-14 MED ORDER — LACTATED RINGERS IV SOLN: INTRAVENOUS | Status: DC

## 2020-07-14 MED ORDER — FENTANYL CITRATE (PF) 100 MCG/2ML IJ SOLN
25.0000 ug | INTRAMUSCULAR | Status: DC | PRN
  Administered 2020-07-14 (×2): 25 ug via INTRAVENOUS

## 2020-07-14 MED ORDER — ONDANSETRON HCL 4 MG/2ML IJ SOLN
INTRAMUSCULAR | Status: DC | PRN
  Administered 2020-07-14: 4 mg via INTRAVENOUS

## 2020-07-14 MED ORDER — DOCUSATE SODIUM 100 MG PO CAPS
100.0000 mg | ORAL_CAPSULE | Freq: Two times a day (BID) | ORAL | Status: DC
  Administered 2020-07-14 – 2020-07-15 (×3): 100 mg via ORAL
  Filled 2020-07-14 (×3): qty 1

## 2020-07-14 MED ORDER — ACETAMINOPHEN 10 MG/ML IV SOLN
INTRAVENOUS | Status: AC
  Filled 2020-07-14: qty 100

## 2020-07-14 MED ORDER — TRAZODONE HCL 100 MG PO TABS
200.0000 mg | ORAL_TABLET | Freq: Every evening | ORAL | Status: DC | PRN
  Administered 2020-07-14: 200 mg via ORAL
  Filled 2020-07-14 (×2): qty 2

## 2020-07-14 MED ORDER — KETOROLAC TROMETHAMINE 30 MG/ML IJ SOLN
INTRAMUSCULAR | Status: AC
  Filled 2020-07-14: qty 1

## 2020-07-14 MED ORDER — FENTANYL CITRATE (PF) 100 MCG/2ML IJ SOLN
INTRAMUSCULAR | Status: AC
  Filled 2020-07-14: qty 2

## 2020-07-14 MED ORDER — GLYCOPYRROLATE 0.2 MG/ML IJ SOLN
INTRAMUSCULAR | Status: DC | PRN
  Administered 2020-07-14: .2 mg via INTRAVENOUS

## 2020-07-14 MED ORDER — KETOROLAC TROMETHAMINE 30 MG/ML IJ SOLN
30.0000 mg | Freq: Four times a day (QID) | INTRAMUSCULAR | Status: AC
  Administered 2020-07-14 – 2020-07-15 (×3): 30 mg via INTRAVENOUS
  Filled 2020-07-14 (×2): qty 1

## 2020-07-14 MED ORDER — OXYCODONE-ACETAMINOPHEN 5-325 MG PO TABS
1.0000 | ORAL_TABLET | ORAL | Status: DC | PRN
  Administered 2020-07-15: 2 via ORAL
  Filled 2020-07-14: qty 2

## 2020-07-14 MED ORDER — LIDOCAINE HCL (PF) 2 % IJ SOLN
INTRAMUSCULAR | Status: AC
  Filled 2020-07-14: qty 5

## 2020-07-14 MED ORDER — ACETAMINOPHEN 10 MG/ML IV SOLN
INTRAVENOUS | Status: DC | PRN
  Administered 2020-07-14: 1000 mg via INTRAVENOUS

## 2020-07-14 MED ORDER — ONDANSETRON HCL 4 MG/2ML IJ SOLN: 4.0000 mg | Freq: Once | INTRAMUSCULAR | Status: DC | PRN

## 2020-07-14 MED ORDER — NIFEDIPINE ER OSMOTIC RELEASE 30 MG PO TB24
30.0000 mg | ORAL_TABLET | Freq: Once | ORAL | Status: AC
  Administered 2020-07-14: 30 mg via ORAL
  Filled 2020-07-14: qty 1

## 2020-07-14 MED ORDER — DEXAMETHASONE SODIUM PHOSPHATE 10 MG/ML IJ SOLN
INTRAMUSCULAR | Status: DC | PRN
  Administered 2020-07-14: 4 mg via INTRAVENOUS

## 2020-07-14 MED ORDER — PROPOFOL 10 MG/ML IV BOLUS
INTRAVENOUS | Status: AC
  Filled 2020-07-14: qty 40

## 2020-07-14 MED ORDER — LIDOCAINE HCL (CARDIAC) PF 100 MG/5ML IV SOSY
PREFILLED_SYRINGE | INTRAVENOUS | Status: DC | PRN
  Administered 2020-07-14: 80 mg via INTRAVENOUS

## 2020-07-14 MED ORDER — SUGAMMADEX SODIUM 200 MG/2ML IV SOLN
INTRAVENOUS | Status: DC | PRN
  Administered 2020-07-14 (×2): 200 mg via INTRAVENOUS

## 2020-07-14 MED ORDER — GABAPENTIN 400 MG PO CAPS
800.0000 mg | ORAL_CAPSULE | Freq: Three times a day (TID) | ORAL | Status: DC
  Administered 2020-07-14 – 2020-07-15 (×3): 800 mg via ORAL
  Filled 2020-07-14 (×5): qty 2

## 2020-07-14 MED ORDER — INSULIN ASPART 100 UNIT/ML ~~LOC~~ SOLN
0.0000 [IU] | SUBCUTANEOUS | Status: DC
  Administered 2020-07-14: 2 [IU] via SUBCUTANEOUS
  Administered 2020-07-14: 1 [IU] via SUBCUTANEOUS
  Administered 2020-07-14 – 2020-07-15 (×2): 2 [IU] via SUBCUTANEOUS
  Administered 2020-07-15: 1 [IU] via SUBCUTANEOUS
  Administered 2020-07-15: 2 [IU] via SUBCUTANEOUS
  Filled 2020-07-14 (×6): qty 1

## 2020-07-14 MED ORDER — LEVOTHYROXINE SODIUM 112 MCG PO TABS
112.0000 ug | ORAL_TABLET | Freq: Every day | ORAL | Status: DC
  Administered 2020-07-15: 112 ug via ORAL
  Filled 2020-07-14 (×2): qty 1

## 2020-07-14 MED ORDER — ROCURONIUM BROMIDE 10 MG/ML (PF) SYRINGE
PREFILLED_SYRINGE | INTRAVENOUS | Status: AC
  Filled 2020-07-14: qty 10

## 2020-07-14 MED ORDER — ONDANSETRON HCL 4 MG/2ML IJ SOLN: 4.0000 mg | Freq: Four times a day (QID) | INTRAMUSCULAR | Status: DC | PRN

## 2020-07-14 MED ORDER — VASOPRESSIN 20 UNIT/ML IV SOLN
INTRAVENOUS | Status: DC | PRN
  Administered 2020-07-14: 2 [IU] via INTRAVENOUS
  Administered 2020-07-14: 1 [IU] via INTRAVENOUS

## 2020-07-14 MED ORDER — ONDANSETRON HCL 4 MG PO TABS: 4.0000 mg | ORAL_TABLET | Freq: Four times a day (QID) | ORAL | Status: DC | PRN

## 2020-07-14 SURGICAL SUPPLY — 51 items
BAG COUNTER SPONGE EZ (MISCELLANEOUS) ×3 IMPLANT
BLADE SURG SZ11 CARB STEEL (BLADE) ×3 IMPLANT
CHLORAPREP W/TINT 26 (MISCELLANEOUS) ×3 IMPLANT
CNTNR SPEC 2.5X3XGRAD LEK (MISCELLANEOUS) ×4
CONT SPEC 4OZ STER OR WHT (MISCELLANEOUS) ×2
CONT SPEC 4OZ STRL OR WHT (MISCELLANEOUS) ×4
CONTAINER SPEC 2.5X3XGRAD LEK (MISCELLANEOUS) IMPLANT
COVER WAND RF STERILE (DRAPES) ×3 IMPLANT
DERMABOND ADVANCED (GAUZE/BANDAGES/DRESSINGS) ×1
DERMABOND ADVANCED .7 DNX12 (GAUZE/BANDAGES/DRESSINGS) ×2 IMPLANT
DRAPE LAPAROTOMY 100X77 ABD (DRAPES) ×3 IMPLANT
DRSG TELFA 3X8 NADH (GAUZE/BANDAGES/DRESSINGS) ×3 IMPLANT
DRSG TELFA 4X3 1S NADH ST (GAUZE/BANDAGES/DRESSINGS) IMPLANT
ELECT BLADE 6 FLAT ULTRCLN (ELECTRODE) ×3 IMPLANT
ELECT CAUTERY BLADE 6.4 (BLADE) ×3 IMPLANT
ELECT REM PT RETURN 9FT ADLT (ELECTROSURGICAL) ×3
ELECTRODE REM PT RTRN 9FT ADLT (ELECTROSURGICAL) ×2 IMPLANT
GAUZE SPONGE 4X4 12PLY STRL (GAUZE/BANDAGES/DRESSINGS) ×2 IMPLANT
GLOVE INDICATOR 8.0 STRL GRN (GLOVE) ×7 IMPLANT
GLOVE SURG ENC MOIS LTX SZ8 (GLOVE) ×9 IMPLANT
GOWN STRL REUS W/ TWL LRG LVL3 (GOWN DISPOSABLE) ×2 IMPLANT
GOWN STRL REUS W/ TWL XL LVL3 (GOWN DISPOSABLE) ×2 IMPLANT
GOWN STRL REUS W/TWL LRG LVL3 (GOWN DISPOSABLE) ×12
GOWN STRL REUS W/TWL XL LVL3 (GOWN DISPOSABLE) ×3
HOLDER FOLEY CATH W/STRAP (MISCELLANEOUS) ×3 IMPLANT
IRRIGATION STRYKERFLOW (MISCELLANEOUS) IMPLANT
IRRIGATOR STRYKERFLOW (MISCELLANEOUS)
IV LACTATED RINGERS 1000ML (IV SOLUTION) IMPLANT
KIT TURNOVER KIT A (KITS) ×3 IMPLANT
LABEL OR SOLS (LABEL) ×3 IMPLANT
MANIFOLD NEPTUNE II (INSTRUMENTS) ×3 IMPLANT
NS IRRIG 500ML POUR BTL (IV SOLUTION) ×3 IMPLANT
PACK BASIN MAJOR ARMC (MISCELLANEOUS) ×3 IMPLANT
PAD DRESSING TELFA 3X8 NADH (GAUZE/BANDAGES/DRESSINGS) ×2 IMPLANT
RETRACTOR WOUND ALXS 18CM MED (MISCELLANEOUS) IMPLANT
RTRCTR WOUND ALEXIS O 18CM MED (MISCELLANEOUS) ×3
SPONGE GAUZE 2X2 8PLY STRL LF (GAUZE/BANDAGES/DRESSINGS) IMPLANT
SPONGE LAP 18X18 RF (DISPOSABLE) ×3 IMPLANT
STAPLER SKIN PROX 35W (STAPLE) ×3 IMPLANT
STRAP SAFETY 5IN WIDE (MISCELLANEOUS) ×3 IMPLANT
STRIP CLOSURE SKIN 1/2X4 (GAUZE/BANDAGES/DRESSINGS) ×3 IMPLANT
SUT ETHIBOND CT1 BRD #0 30IN (SUTURE) ×3 IMPLANT
SUT PDS AB 1 TP1 96 (SUTURE) ×1 IMPLANT
SUT VIC AB 0 CT1 27 (SUTURE) ×9
SUT VIC AB 0 CT1 27XCR 8 STRN (SUTURE) ×2 IMPLANT
SUT VIC AB 0 CT1 36 (SUTURE) ×3 IMPLANT
SUT VIC AB 1 CT1 36 (SUTURE) ×3 IMPLANT
SUT VIC AB 2-0 UR6 27 (SUTURE) IMPLANT
SYR 10ML LL (SYRINGE) ×3 IMPLANT
SYR BULB IRRIG 60ML STRL (SYRINGE) ×1 IMPLANT
TRAY FOLEY MTR SLVR 16FR STAT (SET/KITS/TRAYS/PACK) ×3 IMPLANT

## 2020-07-14 NOTE — Transfer of Care (Signed)
Immediate Anesthesia Transfer of Care Note  Patient: Shannon Higgins  Procedure(s) Performed: OPEN SALPINGO OOPHORECTOMY (Bilateral ) LAPAROTOMY (N/A )  Patient Location: PACU  Anesthesia Type:General  Level of Consciousness: awake, drowsy and patient cooperative  Airway & Oxygen Therapy: Patient Spontanous Breathing and Patient connected to face mask oxygen  Post-op Assessment: Report given to RN and Post -op Vital signs reviewed and stable  Post vital signs: Reviewed and stable  Last Vitals:  Vitals Value Taken Time  BP 120/50 07/14/20 1315  Temp 36.6 C 07/14/20 1315  Pulse 55 07/14/20 1319  Resp 16 07/14/20 1319  SpO2 100 % 07/14/20 1319  Vitals shown include unvalidated device data.  Last Pain:  Vitals:   07/14/20 0827  PainSc: 0-No pain         Complications: No complications documented.

## 2020-07-14 NOTE — Interval H&P Note (Signed)
History and Physical Interval Note:  07/14/2020 10:16 AM  Shannon Higgins  has presented today for surgery, with the diagnosis of Ovarian mass R19.00.  The various methods of treatment have been discussed with the patient and family. After consideration of risks, benefits and other options for treatment, the patient has consented to  Procedure(s): OPEN SALPINGO OOPHORECTOMY (Bilateral) LAPAROTOMY (N/A) as a surgical intervention.  The patient's history has been reviewed, patient examined, no change in status, stable for surgery.  I have reviewed the patient's chart and labs.  Questions were answered to the patient's satisfaction.     Hoyt Koch

## 2020-07-14 NOTE — Anesthesia Procedure Notes (Signed)
Procedure Name: Intubation Date/Time: 07/14/2020 11:01 AM Performed by: Lowry Bowl, CRNA Pre-anesthesia Checklist: Patient identified, Emergency Drugs available, Suction available and Patient being monitored Patient Re-evaluated:Patient Re-evaluated prior to induction Oxygen Delivery Method: Circle system utilized Preoxygenation: Pre-oxygenation with 100% oxygen Induction Type: IV induction Ventilation: Mask ventilation without difficulty Laryngoscope Size: 3 and McGraph Grade View: Grade I Tube type: Oral Tube size: 6.5 mm Number of attempts: 1 Airway Equipment and Method: Stylet and Video-laryngoscopy Placement Confirmation: ETT inserted through vocal cords under direct vision,  positive ETCO2 and breath sounds checked- equal and bilateral Secured at: 20 cm Tube secured with: Tape Dental Injury: Teeth and Oropharynx as per pre-operative assessment

## 2020-07-14 NOTE — Progress Notes (Signed)
Day of Surgery Procedure(s) (LRB): OPEN SALPINGO OOPHORECTOMY (Bilateral) LAPAROTOMY  Subjective: Patient reports incisional pain, tolerating PO and feels well.    Objective: I have reviewed patient's vital signs, intake and output, medications and labs.  Abd: Min T, ND Incision: clean, dry and intact Extr: no calf T, no edema  Assessment: s/p Procedure(s): OPEN SALPINGO OOPHORECTOMY (Bilateral) LAPAROTOMY (N/A): stable  Plan: Advance diet Encourage ambulation Advance to PO medication Discussed results of surgery and frozen section pathology w pt and daughter [name].  Will arrange [place] conference tomorrow.  Recovery expectations discussed.  LOS: 0 days    Hoyt Koch 07/14/2020, 5:48 PM

## 2020-07-14 NOTE — Anesthesia Preprocedure Evaluation (Signed)
Anesthesia Evaluation  Patient identified by MRN, date of birth, ID band Patient awake    Reviewed: Allergy & Precautions, NPO status , Patient's Chart, lab work & pertinent test results  Airway Mallampati: III  TM Distance: <3 FB     Dental  (+) Chipped   Pulmonary asthma , sleep apnea ,    Pulmonary exam normal        Cardiovascular hypertension, Normal cardiovascular exam     Neuro/Psych  Headaches, PSYCHIATRIC DISORDERS Depression  Neuromuscular disease CVA    GI/Hepatic   Endo/Other  diabetesHypothyroidism   Renal/GU Renal disease  Female GU complaint     Musculoskeletal  (+) Arthritis ,   Abdominal Normal abdominal exam  (+)   Peds negative pediatric ROS (+)  Hematology  (+) anemia ,   Anesthesia Other Findings   Reproductive/Obstetrics                             Anesthesia Physical Anesthesia Plan  ASA: III  Anesthesia Plan: General   Post-op Pain Management:    Induction: Intravenous  PONV Risk Score and Plan:   Airway Management Planned: Oral ETT and Video Laryngoscope Planned  Additional Equipment:   Intra-op Plan:   Post-operative Plan: Extubation in OR  Informed Consent: I have reviewed the patients History and Physical, chart, labs and discussed the procedure including the risks, benefits and alternatives for the proposed anesthesia with the patient or authorized representative who has indicated his/her understanding and acceptance.     Dental advisory given  Plan Discussed with: CRNA and Surgeon  Anesthesia Plan Comments:         Anesthesia Quick Evaluation

## 2020-07-14 NOTE — Op Note (Signed)
  Operative Note  07/14/2020  PRE-OP DIAGNOSIS:  Ovarian mass   POST-OP DIAGNOSIS: same, ovarian adenocarcinoma by frozen section analysis   PROCEDURE: Procedure(s): BILATERAL SALPINGO OOPHORECTOMY LAPAROTOMY  PARTIAL OMENTECTOMY  SURGEON: Barnett Applebaum, MD, FACOG  ASSISTANT: Dr Gilman Schmidt, No other capable assistant available, in surgery requiring high level assistant.  ANESTHESIA: Choice   ESTIMATED BLOOD LOSS: 40 mL  SPECIMENS:  Pelvic Washings, Bilateral tubes and ovaries, Left tube and ovary for frozen analysis, Portion of Omentum  COMPLICATIONS: None  DISPOSITION: PACU - hemodynamically stable.  CONDITION: stable  FINDINGS: Exam under anesthesia revealed palpable abdominal mass to umbilicus.  Intrabdominal survey revealed large clear fluid filled cystic mass (17 cm).  Atrophic right ovary.  No palpable nodularity on abdominal wall services or with run of small intestines and omentum.   Frozen section pathology from Dr Dicie Beam revealed high grade adenocarcinoma of the ovary (verbal report).  PROCEDURE IN DETAIL: After informed consent was obtained, the patient was taken to the operating room where general endotracheal anesthesia was performed without difficulty. The patient was positioned in the supine position and the usual precautions taken with her arms in arm boards bilaterally.She was prepped and draped in normal sterile fashion. Time-out was performed and a Foley catheter was placed into the bladder.  A vertical midline incision incision was made and carried down to the underlying fascia. The fascia was dissected bilaterally using mayo scissors, and then the rectus fascia was dissected away from the muscle and separated in the midline. The peritoneum was then entered. Bladder is inferiorally retracted and the bowel was freed up and packed gently  and a Alexis abdominal retractor was then placed.  Pelvic washing are obtained with fluid already present in posterior pelvis (prior  hysterectomy). Pelvic mass palpated and visualized taking up most of pelvis.  The pelvic cystic mass is identified.  It is brought up to incision and suture placed.  An incision and then drainage of the cyst is done, with 1750 mL of fluid aspirated, some leakage noted into abdomen during this process.  The ureters were identified and preserved. The left infundibulopelvic ligaments were skeletonized, divided, then suture ligated. There are two areas of small intestine attached by adhesions to the infundibulopelvic ligament area, and this is carefully dissected free without injury noted.  The mass is completed in its dissection from the pelvic sidewall and removed, sent to pathology.  The right ovary and tube is grasped, the infundibulopelvic ligament and pedicle clamped and cut with suture ligation of this pedicle and further dissection free and removed, sent to routine analysis in the lab.  A portion of the omentum is sequential clamped suture ligated and cut for partial omentectomy.  Colon intact.  No bleeding.  Examination of abdominal and pelvic cavity reveals no nodularity or other abnormalities.  The assistance of my assisting-physician was vital to resect and retract interchangably with self on each side.  After the abdomen was irrigated and all pedicles felt to be hemostatic the laparotomy packs were removed, the fascia was then reapproximated in a running suture with #1 PDS suture.   The subcutaneous space was then irrigated, and hemostasis assured using electrocautery. The skin was closed with a 4-0 vicryl suture followed by Dermabond and a dressing.  Sponge, lap, needle and instrument counts were correct x2 at the end of the procedure. Patient goes to recovery room in stable condition.  Barnett Applebaum, MD, [facility], [facility] 07/14/2020  1:08 PM

## 2020-07-14 NOTE — Progress Notes (Signed)
Left voicemail for patients daughter notifying patients room number.

## 2020-07-15 ENCOUNTER — Encounter: Payer: Self-pay | Admitting: Obstetrics & Gynecology

## 2020-07-15 LAB — HEMOGLOBIN: Hemoglobin: 9.7 g/dL — ABNORMAL LOW (ref 12.0–15.0)

## 2020-07-15 LAB — GLUCOSE, CAPILLARY
Glucose-Capillary: 159 mg/dL — ABNORMAL HIGH (ref 70–99)
Glucose-Capillary: 141 mg/dL — ABNORMAL HIGH (ref 70–99)
Glucose-Capillary: 189 mg/dL — ABNORMAL HIGH (ref 70–99)

## 2020-07-15 MED ORDER — OXYCODONE-ACETAMINOPHEN 5-325 MG PO TABS: 1.0000 | ORAL_TABLET | ORAL | 0 refills | Status: DC | PRN

## 2020-07-15 NOTE — Progress Notes (Signed)
BP 177/77, pulse 57. Pt stated she took propanolol at home prior to procedure this AM. No complaints of pain, asymptomatic. Paged Dr. Kenton Kingfisher. New Orders placed for procardia 30mg .

## 2020-07-15 NOTE — Progress Notes (Signed)
Patient discharged.  Discharge instructions and appointments given.  Patient and patients daughter verbalizes understanding.  Transported by auxiliary.

## 2020-07-15 NOTE — Anesthesia Postprocedure Evaluation (Signed)
Anesthesia Post Note  Patient: Shannon Higgins  Procedure(s) Performed: OPEN SALPINGO OOPHORECTOMY (Bilateral ) LAPAROTOMY (N/A )  Patient location during evaluation: PACU Anesthesia Type: General Level of consciousness: awake and alert and oriented Pain management: pain level controlled Vital Signs Assessment: post-procedure vital signs reviewed and stable Respiratory status: spontaneous breathing Cardiovascular status: blood pressure returned to baseline Anesthetic complications: no   No complications documented.   Last Vitals:  Vitals:   07/15/20 0750 07/15/20 1145  BP: 100/64 (!) 90/59  Pulse: 67 64  Resp: 18 18  Temp: 36.6 C 36.8 C  SpO2: 98% 97%    Last Pain:  Vitals:   07/15/20 0750  TempSrc: Oral  PainSc:                  Oriel Ojo

## 2020-07-15 NOTE — Discharge Summary (Signed)
Gynecology Physician Postoperative Discharge Summary  Patient ID: Shannon Higgins MRN: [id number] DOB/AGE: 84-Aug-1936 84 y.o.  Admit Date: 07/14/2020 Discharge Date: 07/15/2020  Preoperative Diagnoses: Pelvic mass  Procedures: Procedure(s) (LRB): OPEN SALPINGO OOPHORECTOMY (Bilateral) LAPAROTOMY (N/A)  Significant Labs: CBC Latest Ref Rng & Units 07/15/2020 07/10/2020 05/05/2020  WBC 4.0 - 10.5 K/uL - 9.2 11.1(H)  Hemoglobin 12.0 - 15.0 g/dL 9.7(L) 13.2 11.9(L)  Hematocrit 36.0 - 46.0 % - 42.4 36.8  Platelets 150 - 400 K/uL - 292 386.0    Hospital Course:  KASSONDRA GEIL is a 86 y.o. [id number]  admitted for scheduled surgery.  She underwent the procedures as mentioned above, her operation was uncomplicated. For further details about surgery, please refer to the operative report. She is to see GYN ONC for consultation after frozen section lab analysis of the left ovarian mass revealed cancer. Patient had an uncomplicated postoperative course. By time of discharge on POD#1, her pain was controlled on oral pain medications; she was ambulating, voiding without difficulty, tolerating regular diet and passing flatus. She was deemed stable for discharge to home.   Discharge Exam: Blood pressure 100/64, pulse 67, temperature 97.9 F (36.6 C), temperature source Oral, resp. rate 18, height _0  (1.626 m), weight 69.4 kg, SpO2 91 %. General appearance: alert and no distress  Resp: clear to auscultation bilaterally  Cardio: regular rate and rhythm  GI: soft, non-tender; bowel sounds normal; no masses, no organomegaly.  Incision: C/D/I, no erythema, no drainage noted Pelvic: scant blood on pad  Extremities: extremities normal, atraumatic, no cyanosis or edema and Homans sign is negative, no sign of DVT  Discharged Condition: Stable  Disposition: Discharge disposition: 01-Home or Self Care       Discharge Instructions    Call MD for:  persistant nausea and vomiting   Complete by: As  directed    Call MD for:  redness, tenderness, or signs of infection (pain, swelling, redness, odor or green/yellow discharge around incision site)   Complete by: As directed    Call MD for:  severe uncontrolled pain   Complete by: As directed    Call MD for:  temperature >100.4   Complete by: As directed    Diet - low sodium heart healthy   Complete by: As directed    Discharge instructions   Complete by: As directed    Resume activities according to discharge instruction sheets   If the dressing is still on your incision site when you go home, remove it on the third day after your surgery date. Remove dressing if it begins to fall off, or if it is dirty or damaged before the third day.   Complete by: As directed    Increase activity slowly   Complete by: As directed      Allergies as of 07/15/2020   No Known Allergies     Medication List    TAKE these medications   Accu-Chek FastClix Lancets Misc USE AS INSTRUCTED TO TEST BLOOD SUGAR DAILY   Accu-Chek Guide test strip Generic drug: glucose blood USE AS INSTRUCTED TO BLOOD SUGAR DAILY   aspirin EC 325 MG tablet Take 325 mg by mouth daily.   blood glucose meter kit and supplies Dispense based on patient and insurance preference. Use up to 2 times daily as directed. (FOR ICD-10 E10.9, E11.9).   Botox 200 units Solr Generic drug: Botulinum Toxin Type A Inject 1 vial into the muscle every 3 (three) months.   donepezil 10 MG  tablet Commonly known as: ARICEPT Take 1 tablet (10 mg total) by mouth at bedtime.   dorzolamide-timolol 22.3-6.8 MG/ML ophthalmic solution Commonly known as: COSOPT Place 1 drop into both eyes 2 (two) times daily.   DULoxetine 60 MG capsule Commonly known as: CYMBALTA TAKE 1 CAPSULE EVERY DAY  FOR  DEPRESSION What changed: See the new instructions.   fluticasone 50 MCG/ACT nasal spray Commonly known as: FLONASE Place 1 spray into both nostrils daily.   gabapentin 800 MG tablet Commonly  known as: NEURONTIN TAKE 1 TABLET BY MOUTH 3 TIMES A DAY FOR NEUROPATHY   latanoprost 0.005 % ophthalmic solution Commonly known as: XALATAN Place 1 drop into both eyes at bedtime.   levothyroxine 112 MCG tablet Commonly known as: SYNTHROID TAKE 1 TABLET EVERY MORNING ON AN EMPTY STOMACH WITH WATER ONLY.  NO FOOD OR OTHER MEDICATIONS FOR 30 MINUTES.   meclizine 25 MG tablet Commonly known as: ANTIVERT TAKE 1 TABLET (25 MG TOTAL) BY MOUTH 2 (TWO) TIMES DAILY AS NEEDED FOR DIZZINESS.   meloxicam 15 MG tablet Commonly known as: MOBIC TAKE 1 TABLET DAILY AS NEEDED FOR PAIN. What changed: See the new instructions.   Nurtec 75 MG Tbdp Generic drug: Rimegepant Sulfate Take 1 tablet by mouth as needed. Take 1 tablet as needed for migraine. Do not take more than 1 in 24 hours.   omeprazole 20 MG capsule Commonly known as: PRILOSEC TAKE 1 CAPSULE BY MOUTH TWICE DAILY FOR HEARTBURN. What changed:   how much to take  how to take this  when to take this   oxyCODONE-acetaminophen 5-325 MG tablet Commonly known as: PERCOCET/ROXICET Take 1-2 tablets by mouth every 4 (four) hours as needed for moderate pain.   pilocarpine 2 % ophthalmic solution Commonly known as: PILOCAR Place 1 drop into both eyes 4 (four) times daily.   propranolol 80 MG tablet Commonly known as: INDERAL TAKE 1 TABLET ONE TIME DAILY FOR BLOOD PRESSURE What changed:   how much to take  how to take this  when to take this  additional instructions   Rhopressa 0.02 % Soln Generic drug: Netarsudil Dimesylate Place 1 drop into both eyes daily at 6 (six) AM.   rosuvastatin 10 MG tablet Commonly known as: CRESTOR TAKE 1 TABLET DAILY FOR CHOLESTEROL. What changed:   how much to take  how to take this  when to take this  additional instructions   traZODone 100 MG tablet Commonly known as: DESYREL Take two tablets at bedtime as needed for sleep What changed:   how much to take  how to take  this  when to take this  reasons to take this            Discharge Care Instructions  (From admission, onward)         Start     Ordered   07/15/20 0000  If the dressing is still on your incision site when you go home, remove it on the third day after your surgery date. Remove dressing if it begins to fall off, or if it is dirty or damaged before the third day.        07/15/20 0758          Follow-up Information    Gae Dry, MD. Go in 2 week(s).   Specialty: Obstetrics and Gynecology Why: Post Op as schedued Contact information: [address] [place] [address] [phone]  Barnett Applebaum, MD

## 2020-07-15 NOTE — Progress Notes (Signed)
Foley removed per order. Pt ambulated to nurses station and back to rm 348 with stand by assist. Pt tolerated well.

## 2020-07-15 NOTE — Discharge Instructions (Signed)
Bilateral Salpingo-Oophorectomy, Care After This sheet gives you information about how to care for yourself after your procedure. Your health care provider may also give you more specific instructions. If you have problems or questions, contact your health care provider. What can I expect after the procedure? After the procedure, it is common to have:  Abdominal pain.  Some occasional vaginal bleeding (spotting).  Tiredness.  Symptoms of menopause, such as hot flashes, night sweats, or mood swings. Follow these instructions at home: Incision care  Keep your incision area and your bandage (dressing) clean and dry.  Follow instructions from your health care provider about how to take care of your incision. Make sure you: ? Wash your hands with soap and water before you change your dressing. If soap and water are not available, use hand sanitizer. ? Change your dressing as told by your health care provider. ? Leave stitches (sutures), staples, skin glue, or adhesive strips in place. These skin closures may need to stay in place for 2 weeks or longer. If adhesive strip edges start to loosen and curl up, you may trim the loose edges. Do not remove adhesive strips completely unless your health care provider tells you to do that.  Check your incision area every day for signs of infection. Check for: ? Redness, swelling, or pain. ? Fluid or blood. ? Warmth. ? Pus or a bad smell.   Activity  Do not drive or use heavy machinery while taking prescription pain medicine.  Do not drive for 24 hours if you received a medicine to help you relax (sedative) during your procedure.  Take frequent, short walks throughout the day. Rest when you get tired. Ask your health care provider what activities are safe for you.  Avoid activity that requires great effort. Also, avoid heavy lifting. Do not lift anything that is heavier than 10 lbs. (4.5 kg), or the limit that your health care provider tells you,  until he or she says that it is safe to do so.  Do not douche, use tampons, or have sex until your health care provider approves.   General instructions  To prevent or treat constipation while you are taking prescription pain medicine, your health care provider may recommend that you: ? Drink enough fluid to keep your urine clear or pale yellow. ? Take over-the-counter or prescription medicines. ? Eat foods that are high in fiber, such as fresh fruits and vegetables, whole grains, and beans. ? Limit foods that are high in fat and processed sugars, such as fried and sweet foods.  Take over-the-counter and prescription medicines only as told by your health care provider.  Do not take baths, swim, or use a hot tub until your health care provider approves. Ask your health care provider if you can take showers. You may only be allowed to take sponge baths for bathing.  Wear compression stockings as told by your health care provider. These stockings help to prevent blood clots and reduce swelling in your legs.  Keep all follow-up visits as told by your health care provider. This is important.   Contact a health care provider if:  You have pain when you urinate.  You have pus or a bad smelling discharge coming from your vagina.  You have redness, swelling, or pain around your incision.  You have fluid or blood coming from your incision.  Your incision feels warm to the touch.  You have pus or a bad smell coming from your incision.  You have  a fever.  Your incision starts to break open.  You have pain in the abdomen, and it gets worse or does not get better when you take medicine.  You develop a rash.  You develop nausea and vomiting.  You feel lightheaded. Get help right away if:  You develop pain in your chest or leg.  You become short of breath.  You faint.  You have increased bleeding from your vagina. Summary  After the procedure, it is common to have pain, bleeding  in the vagina, and symptoms of menopause.  Follow instructions from your health care provider about how to take care of your incision.  Follow instructions from your health care provider about activities and restrictions.  Check your incision every day for signs of infection and report any symptoms to your health care provider. This information is not intended to replace advice given to you by your health care provider. Make sure you discuss any questions you have with your health care provider. Document Revised: 06/22/2018 Document Reviewed: 05/23/2016 Elsevier Patient Education  2021 [employer].

## 2020-07-16 ENCOUNTER — Telehealth: Payer: Self-pay

## 2020-07-16 NOTE — Telephone Encounter (Signed)
Dr. Fransisca Connors spoke with Dr. Kenton Kingfisher. Appointment arranged to see medical oncology next week based on operative findings. She will see gyn oncology 3/30.

## 2020-07-20 LAB — CYTOLOGY - NON PAP

## 2020-07-20 LAB — SURGICAL PATHOLOGY

## 2020-07-21 ENCOUNTER — Encounter: Payer: Self-pay | Admitting: Obstetrics & Gynecology

## 2020-07-21 ENCOUNTER — Other Ambulatory Visit: Payer: Self-pay

## 2020-07-21 ENCOUNTER — Inpatient Hospital Stay: Payer: Medicare HMO | Attending: Oncology | Admitting: Oncology

## 2020-07-21 ENCOUNTER — Ambulatory Visit (INDEPENDENT_AMBULATORY_CARE_PROVIDER_SITE_OTHER): Payer: Medicare HMO | Admitting: Obstetrics & Gynecology

## 2020-07-21 ENCOUNTER — Encounter: Payer: Self-pay | Admitting: Oncology

## 2020-07-21 ENCOUNTER — Inpatient Hospital Stay: Payer: Medicare HMO

## 2020-07-21 VITALS — BP 120/80 | Ht 64.0 in | Wt 153.0 lb

## 2020-07-21 VITALS — BP 141/66 | HR 51 | Temp 99.6°F | Resp 18 | Ht 64.0 in | Wt 153.0 lb

## 2020-07-21 DIAGNOSIS — G8929 Other chronic pain: Secondary | ICD-10-CM | POA: Diagnosis not present

## 2020-07-21 DIAGNOSIS — Z7982 Long term (current) use of aspirin: Secondary | ICD-10-CM | POA: Diagnosis not present

## 2020-07-21 DIAGNOSIS — Z79899 Other long term (current) drug therapy: Secondary | ICD-10-CM | POA: Insufficient documentation

## 2020-07-21 DIAGNOSIS — Z85820 Personal history of malignant melanoma of skin: Secondary | ICD-10-CM | POA: Insufficient documentation

## 2020-07-21 DIAGNOSIS — D649 Anemia, unspecified: Secondary | ICD-10-CM

## 2020-07-21 DIAGNOSIS — Z8673 Personal history of transient ischemic attack (TIA), and cerebral infarction without residual deficits: Secondary | ICD-10-CM | POA: Diagnosis not present

## 2020-07-21 DIAGNOSIS — Z791 Long term (current) use of non-steroidal anti-inflammatories (NSAID): Secondary | ICD-10-CM | POA: Insufficient documentation

## 2020-07-21 DIAGNOSIS — G4733 Obstructive sleep apnea (adult) (pediatric): Secondary | ICD-10-CM | POA: Insufficient documentation

## 2020-07-21 DIAGNOSIS — C563 Malignant neoplasm of bilateral ovaries: Secondary | ICD-10-CM

## 2020-07-21 DIAGNOSIS — F329 Major depressive disorder, single episode, unspecified: Secondary | ICD-10-CM | POA: Diagnosis not present

## 2020-07-21 DIAGNOSIS — I10 Essential (primary) hypertension: Secondary | ICD-10-CM | POA: Diagnosis not present

## 2020-07-21 DIAGNOSIS — Z7189 Other specified counseling: Secondary | ICD-10-CM | POA: Diagnosis not present

## 2020-07-21 DIAGNOSIS — K219 Gastro-esophageal reflux disease without esophagitis: Secondary | ICD-10-CM | POA: Insufficient documentation

## 2020-07-21 DIAGNOSIS — F0390 Unspecified dementia without behavioral disturbance: Secondary | ICD-10-CM | POA: Diagnosis not present

## 2020-07-21 DIAGNOSIS — Z95828 Presence of other vascular implants and grafts: Secondary | ICD-10-CM

## 2020-07-21 DIAGNOSIS — M545 Low back pain, unspecified: Secondary | ICD-10-CM | POA: Diagnosis not present

## 2020-07-21 DIAGNOSIS — E039 Hypothyroidism, unspecified: Secondary | ICD-10-CM | POA: Insufficient documentation

## 2020-07-21 DIAGNOSIS — C569 Malignant neoplasm of unspecified ovary: Secondary | ICD-10-CM | POA: Insufficient documentation

## 2020-07-21 DIAGNOSIS — E1142 Type 2 diabetes mellitus with diabetic polyneuropathy: Secondary | ICD-10-CM | POA: Diagnosis not present

## 2020-07-21 DIAGNOSIS — Z90722 Acquired absence of ovaries, bilateral: Secondary | ICD-10-CM | POA: Insufficient documentation

## 2020-07-21 LAB — COMPREHENSIVE METABOLIC PANEL
ALT: 11 U/L (ref 0–44)
AST: 19 U/L (ref 15–41)
Albumin: 3.4 g/dL — ABNORMAL LOW (ref 3.5–5.0)
Alkaline Phosphatase: 109 U/L (ref 38–126)
Anion gap: 12 (ref 5–15)
BUN: 11 mg/dL (ref 8–23)
CO2: 27 mmol/L (ref 22–32)
Calcium: 8.7 mg/dL — ABNORMAL LOW (ref 8.9–10.3)
Chloride: 98 mmol/L (ref 98–111)
Creatinine, Ser: 0.84 mg/dL (ref 0.44–1.00)
GFR, Estimated: >60 mL/min (ref >60)
Glucose, Bld: 112 mg/dL — ABNORMAL HIGH (ref 70–99)
Potassium: 3 mmol/L — ABNORMAL LOW (ref 3.5–5.1)
Sodium: 137 mmol/L (ref 135–145)
Total Bilirubin: 0.5 mg/dL (ref 0.3–1.2)
Total Protein: 7.3 g/dL (ref 6.5–8.1)

## 2020-07-21 LAB — CBC WITH DIFFERENTIAL/PLATELET
Abs Immature Granulocytes: 0.53 10*3/uL — ABNORMAL HIGH (ref 0.00–0.07)
Basophils Absolute: 0.1 10*3/uL (ref 0.0–0.1)
Basophils Relative: 1 %
Eosinophils Absolute: 1 10*3/uL — ABNORMAL HIGH (ref 0.0–0.5)
Eosinophils Relative: 8 %
HCT: 35.1 % — ABNORMAL LOW (ref 36.0–46.0)
Hemoglobin: 11.2 g/dL — ABNORMAL LOW (ref 12.0–15.0)
Immature Granulocytes: 4 %
Lymphocytes Relative: 17 %
Lymphs Abs: 2.1 10*3/uL (ref 0.7–4.0)
MCH: 28.1 pg (ref 26.0–34.0)
MCHC: 31.9 g/dL (ref 30.0–36.0)
MCV: 88 fL (ref 80.0–100.0)
Monocytes Absolute: 1.3 10*3/uL — ABNORMAL HIGH (ref 0.1–1.0)
Monocytes Relative: 10 %
Neutro Abs: 7.6 10*3/uL (ref 1.7–7.7)
Neutrophils Relative %: 60 %
Platelets: 399 10*3/uL (ref 150–400)
RBC: 3.99 MIL/uL (ref 3.87–5.11)
RDW: 15.8 % — ABNORMAL HIGH (ref 11.5–15.5)
WBC: 12.6 10*3/uL — ABNORMAL HIGH (ref 4.0–10.5)
nRBC: 0.2 % (ref 0.0–0.2)

## 2020-07-21 LAB — FERRITIN: Ferritin: 66 ng/mL (ref 11–307)

## 2020-07-21 LAB — IRON AND TIBC
Iron: 51 ug/dL (ref 28–170)
Saturation Ratios: 15 % (ref 10.4–31.8)
TIBC: 351 ug/dL (ref 250–450)
UIBC: 300 ug/dL

## 2020-07-21 LAB — VITAMIN B12: Vitamin B-12: 141 pg/mL — ABNORMAL LOW (ref 180–914)

## 2020-07-21 LAB — FOLATE: Folate: 13.6 ng/mL (ref >5.9)

## 2020-07-21 NOTE — Progress Notes (Signed)
New patient referred by Dr Kenton Kingfisher for ovarian cancer. Pt had surgery on 07/14/20. Pt daughter is present today.[name]

## 2020-07-21 NOTE — Progress Notes (Signed)
°  Postoperative Follow-up Patient presents post op from laparotomy BSO for ovarian cancer, 1 week ago.  Subjective: Patient reports some improvement in her preop symptoms. Eating a regular diet without difficulty. Pain is controlled with current analgesics. Medications being used: acetaminophen.  Activity: sedentary. Patient reports additional symptom's since surgery of No bleeding.  Fever and dizziness over weekend, self resolved.  Objective: BP 120/80    Ht 5\' 4"  (1.626 m)    Wt 153 lb (69.4 kg)    BMI 26.26 kg/m  Physical Exam Constitutional:      General: She is not in acute distress.    Appearance: She is well-developed.  Cardiovascular:     Rate and Rhythm: Normal rate.  Pulmonary:     Effort: Pulmonary effort is normal.  Abdominal:     General: There is no distension.     Palpations: Abdomen is soft.     Tenderness: There is no abdominal tenderness.     Comments: Incision Healing Well   Musculoskeletal:        General: Normal range of motion.  Neurological:     Mental Status: She is alert and oriented to person, place, and time.     Cranial Nerves: No cranial nerve deficit.  Skin:    General: Skin is warm and dry.     Assessment: s/p :  laparotomy BSO stable  Plan: Patient has done well after surgery with no apparent complications.  I have discussed the post-operative course to date, and the expected progress moving forward.  The patient understands what complications to be concerned about.  I will see the patient in routine follow up, or sooner if needed.    Activity plan: No heavy lifting.  Pelvic rest. Has seen Gyn Onc and is proceeding w chemo soon  Hoyt Koch 07/21/2020, 4:34 PM

## 2020-07-22 ENCOUNTER — Telehealth (INDEPENDENT_AMBULATORY_CARE_PROVIDER_SITE_OTHER): Payer: Self-pay

## 2020-07-22 ENCOUNTER — Other Ambulatory Visit: Payer: Self-pay

## 2020-07-22 LAB — CA 125: Cancer Antigen (CA) 125: 98.1 U/mL — ABNORMAL HIGH (ref 0.0–38.1)

## 2020-07-22 MED ORDER — POTASSIUM CHLORIDE CRYS ER 20 MEQ PO TBCR: 20.0000 meq | EXTENDED_RELEASE_TABLET | Freq: Every day | ORAL | 0 refills | Status: DC

## 2020-07-22 NOTE — Telephone Encounter (Signed)
LMOM- to let pts daughter know know rx will be sent in for potassium 20 mEq  Daily for 2 wks and to add OTC B12 1,000 mcg daily

## 2020-07-22 NOTE — Telephone Encounter (Signed)
Patient's daughter called and she is scheduled for a port insertion with Dr. Lucky Cowboy on 07/27/20 with a 10:30 am arrival time to the [facility]. Covid testing on 07/23/20 between 8-2 pm at the [facility]. Pre-procedure instructions were discussed and will be mailed.

## 2020-07-22 NOTE — Telephone Encounter (Signed)
I attempted to contact the patient to schedule a port placement and a message was left for a return call. 

## 2020-07-23 ENCOUNTER — Other Ambulatory Visit: Payer: Self-pay

## 2020-07-23 ENCOUNTER — Other Ambulatory Visit
Admission: RE | Admit: 2020-07-23 | Discharge: 2020-07-23 | Disposition: A | Discharge status: Home / Self Care | Payer: Medicare HMO | Source: Ambulatory Visit | Attending: Vascular Surgery | Admitting: Vascular Surgery

## 2020-07-23 DIAGNOSIS — Z01812 Encounter for preprocedural laboratory examination: Secondary | ICD-10-CM | POA: Diagnosis not present

## 2020-07-23 DIAGNOSIS — Z20822 Contact with and (suspected) exposure to covid-19: Secondary | ICD-10-CM | POA: Diagnosis not present

## 2020-07-23 LAB — SARS CORONAVIRUS 2 (TAT 6-24 HRS): SARS Coronavirus 2: NEGATIVE

## 2020-07-24 ENCOUNTER — Telehealth (INDEPENDENT_AMBULATORY_CARE_PROVIDER_SITE_OTHER): Payer: Medicare HMO | Admitting: Neurology

## 2020-07-24 ENCOUNTER — Encounter: Payer: Self-pay | Admitting: Oncology

## 2020-07-24 ENCOUNTER — Other Ambulatory Visit: Payer: Self-pay | Admitting: *Deleted

## 2020-07-24 ENCOUNTER — Encounter: Payer: Self-pay | Admitting: Neurology

## 2020-07-24 VITALS — Ht 64.0 in | Wt 152.0 lb

## 2020-07-24 DIAGNOSIS — G629 Polyneuropathy, unspecified: Secondary | ICD-10-CM

## 2020-07-24 DIAGNOSIS — G43709 Chronic migraine without aura, not intractable, without status migrainosus: Secondary | ICD-10-CM | POA: Diagnosis not present

## 2020-07-24 DIAGNOSIS — C563 Malignant neoplasm of bilateral ovaries: Secondary | ICD-10-CM

## 2020-07-24 DIAGNOSIS — Z0279 Encounter for issue of other medical certificate: Secondary | ICD-10-CM

## 2020-07-24 DIAGNOSIS — F0390 Unspecified dementia without behavioral disturbance: Secondary | ICD-10-CM | POA: Diagnosis not present

## 2020-07-24 DIAGNOSIS — F03A0 Unspecified dementia, mild, without behavioral disturbance, psychotic disturbance, mood disturbance, and anxiety: Secondary | ICD-10-CM

## 2020-07-24 MED ORDER — DEXAMETHASONE 4 MG PO TABS: 8.0000 mg | ORAL_TABLET | Freq: Every day | ORAL | 1 refills | Status: DC

## 2020-07-24 MED ORDER — LIDOCAINE-PRILOCAINE 2.5-2.5 % EX CREA: TOPICAL_CREAM | CUTANEOUS | 3 refills | Status: DC

## 2020-07-24 MED ORDER — DONEPEZIL HCL 10 MG PO TABS: 10.0000 mg | ORAL_TABLET | Freq: Every day | ORAL | 3 refills | Status: DC

## 2020-07-24 MED ORDER — LORAZEPAM 0.5 MG PO TABS: 0.5000 mg | ORAL_TABLET | Freq: Four times a day (QID) | ORAL | 0 refills | Status: DC | PRN

## 2020-07-24 MED ORDER — ONDANSETRON HCL 8 MG PO TABS: 8.0000 mg | ORAL_TABLET | Freq: Two times a day (BID) | ORAL | 1 refills | Status: DC | PRN

## 2020-07-24 MED ORDER — PROCHLORPERAZINE MALEATE 10 MG PO TABS: 10.0000 mg | ORAL_TABLET | Freq: Four times a day (QID) | ORAL | 1 refills | Status: DC | PRN

## 2020-07-24 NOTE — Telephone Encounter (Signed)
Hassan Rowan did it and took care of it.

## 2020-07-24 NOTE — Progress Notes (Signed)
Telephone (Audio) Visit The purpose of this telephone visit is to provide medical care while limiting exposure to the novel coronavirus.    Consent was obtained for telephone visit:  Yes.   Answered questions that patient had about telehealth interaction:  Yes.   I discussed the limitations, risks, security and privacy concerns of performing an evaluation and management service by telephone. I also discussed with the patient that there may be a patient responsible charge related to this service. The patient expressed understanding and agreed to proceed.  Pt location: Home Physician Location: office Name of referring provider:  Pleas Koch, NP I connected with .Whitney Post Lorino at patients initiation/request on 07/24/2020 at  3:00 PM EDT by telephone and verified that I am speaking with the correct person using two identifiers.  Pt MRN:  [id number] Pt DOB:  1935/05/01   History of Present Illness:  The patient had a telephone visit on 07/24/2020. Unable to connect via video due to technical difficulties. Her daughter [name] is present to provide additional information. She had an open salpingooophorectomy laparotomy on 3/15 and has been found to have ovarian cancer. She was very confused after the surgery and does not remember this. She cleared up when she got home, then on the weekend had a little fever with some confusion. She thinks her memory is pretty good. She is on Donepezil 16m daily without side effects. Migraines have responded well to Botox, last Botox injection was a month ago. She feels her neuropathy is getting worse, with more problems getting around. She is on gabapentin 8051mTID without side effects. She uses her walker all the time, no falls. Sleep is good with Trazodone.    History on Initial Assessment 02/08/2017: This is a 86 year old right-handed woman with a history of hypertension, diabetes, rheumatoid arthritis, breast cancer, presenting for evaluation of chronic  headaches and confusion.   1. Headaches Headaches started in her 840sShe went for a period of time with no significant headaches for a year, then last June headaches recurred lasting for weeks at a time. She has 20 to 25 headache days a month, with nausea, upset stomach, sometimes diarrhea. She describes a lot of pressure and throbbing. She had a headache that lasted for 2 months, resolving 2 weeks ago, then this weekend headaches started back and have been ongoing for 4-5 days. She denies any visual obscurations, no photo/phonophobia. She does not usually have dizziness with the headaches, but recently has been so dizzy she had difficulty getting to the bathroom the past week, with described spinning sensation. Meclizine helps some. She has had vertigo in the past. No family history of migraines. She has tried several headache preventative medications, including amitriptyline, Topiramate. She is taking Propranolol 8066maily with no side effects. She is also on Neurontin for back pain and Cymbalta for mood.   2. Confusion. She reports word-finding difficulties for the past 6 months. Her daughter mostly describes the episodic confusion as forgetting what she had previously told the patient. She has been taking an old meclizine prescription, but forgot that her daughter told her this information. She has not been driving for the past month due to glaucoma, denied previously getting lost driving. After her husband passed away 1.5 years ago, she moved in with her daughter and son-in-law. They are in charge of finances. Her daughter states she forgets her medications. One time she forgot to take her Cymbalta for a week. No paranoia or hallucinations. She  used to be a social butterfly but now likes to be more at home. There is a strong family history of dementia in her mother and multiple siblings.   She denies any diplopia, dysarthria/dysphagia, anosmia, or tremors. She has chronic neck and back pain and  neuropathy in both feet radiating up her legs. She has a little urinary incontinence, no bowel issues. They report several falls with the dizziness. After she hit her head with fall in July, she had a bad headache for 6 weeks. She was veering to the left and went to the ER on 7/13 where head CT did not show any acute changes. She got a migraine cocktail but headache was still bad the next day.      Current Outpatient Medications on File Prior to Visit  Medication Sig Dispense Refill  . Accu-Chek FastClix Lancets MISC USE AS INSTRUCTED TO TEST BLOOD SUGAR DAILY 102 each 0  . ACCU-CHEK GUIDE test strip USE AS INSTRUCTED TO BLOOD SUGAR DAILY 100 strip 0  . aspirin EC 325 MG tablet Take 325 mg by mouth daily.    . blood glucose meter kit and supplies Dispense based on patient and insurance preference. Use up to 2 times daily as directed. (FOR ICD-10 E10.9, E11.9). 1 each 0  . BOTOX 200 units SOLR Inject 1 vial into the muscle every 3 (three) months.    . donepezil (ARICEPT) 10 MG tablet Take 1 tablet (10 mg total) by mouth at bedtime. 30 tablet 0  . dorzolamide-timolol (COSOPT) 22.3-6.8 MG/ML ophthalmic solution Place 1 drop into both eyes 2 (two) times daily.    . DULoxetine (CYMBALTA) 60 MG capsule TAKE 1 CAPSULE EVERY DAY  FOR  DEPRESSION (Patient taking differently: Take 60 mg by mouth daily.) 90 capsule 1  . fluticasone (FLONASE) 50 MCG/ACT nasal spray Place 1 spray into both nostrils daily. 48 g 0  . gabapentin (NEURONTIN) 800 MG tablet TAKE 1 TABLET BY MOUTH 3 TIMES A DAY FOR NEUROPATHY 270 tablet 1  . latanoprost (XALATAN) 0.005 % ophthalmic solution Place 1 drop into both eyes at bedtime.    [name] levothyroxine (SYNTHROID) 112 MCG tablet TAKE 1 TABLET EVERY MORNING ON AN EMPTY STOMACH WITH WATER ONLY.  NO FOOD OR OTHER MEDICATIONS FOR 30 MINUTES. (Patient taking differently: TAKE 1 TABLET EVERY MORNING ON AN EMPTY STOMACH WITH WATER ONLY.  NO FOOD OR OTHER MEDICATIONS FOR 30 MINUTES.) 90 tablet 1   . meclizine (ANTIVERT) 25 MG tablet TAKE 1 TABLET (25 MG TOTAL) BY MOUTH 2 (TWO) TIMES DAILY AS NEEDED FOR DIZZINESS. 180 tablet 0  . meloxicam (MOBIC) 15 MG tablet TAKE 1 TABLET DAILY AS NEEDED FOR PAIN. (Patient taking differently: Take 15 mg by mouth at bedtime.) 90 tablet 0  . Netarsudil Dimesylate (RHOPRESSA) 0.02 % SOLN Place 1 drop into both eyes daily at 6 (six) AM.    . omeprazole (PRILOSEC) 20 MG capsule TAKE 1 CAPSULE BY MOUTH TWICE DAILY FOR HEARTBURN. (Patient taking differently: Take 20 mg by mouth 2 (two) times daily before a meal. TAKE 1 CAPSULE BY MOUTH TWICE DAILY FOR HEARTBURN.) 180 capsule 1  . pilocarpine (PILOCAR) 2 % ophthalmic solution Place 1 drop into both eyes 4 (four) times daily.     . potassium chloride SA (KLOR-CON) 20 MEQ tablet Take 1 tablet (20 mEq total) by mouth daily. 14 tablet 0  . propranolol (INDERAL) 80 MG tablet TAKE 1 TABLET ONE TIME DAILY FOR BLOOD PRESSURE (Patient taking differently: Take 80 mg  by mouth daily.) 90 tablet 1  . rosuvastatin (CRESTOR) 10 MG tablet TAKE 1 TABLET DAILY FOR CHOLESTEROL. (Patient taking differently: Take 10 mg by mouth every evening.) 90 tablet 1  . traZODone (DESYREL) 100 MG tablet Take two tablets at bedtime as needed for sleep (Patient taking differently: Take 200 mg by mouth at bedtime as needed for sleep. Take two tablets at bedtime as needed for sleep) 180 tablet 0  . dexamethasone (DECADRON) 4 MG tablet Take 2 tablets (8 mg total) by mouth daily. Start the day after carboplatin chemotherapy for 3 days. (Patient not taking: Reported on 07/24/2020) 30 tablet 1  . lidocaine-prilocaine (EMLA) cream Apply to affected area once (Patient not taking: Reported on 07/24/2020) 30 g 3  . LORazepam (ATIVAN) 0.5 MG tablet Take 1 tablet (0.5 mg total) by mouth every 6 (six) hours as needed (Nausea or vomiting). (Patient not taking: Reported on 07/24/2020) 30 tablet 0  . ondansetron (ZOFRAN) 8 MG tablet Take 1 tablet (8 mg total) by mouth 2  (two) times daily as needed for refractory nausea / vomiting. Start on day 3 after carboplatin chemo. (Patient not taking: Reported on 07/24/2020) 30 tablet 1  . oxyCODONE-acetaminophen (PERCOCET/ROXICET) 5-325 MG tablet Take 1-2 tablets by mouth every 4 (four) hours as needed for moderate pain. (Patient not taking: Reported on 07/24/2020) 30 tablet 0  . prochlorperazine (COMPAZINE) 10 MG tablet Take 1 tablet (10 mg total) by mouth every 6 (six) hours as needed (Nausea or vomiting). (Patient not taking: Reported on 07/24/2020) 30 tablet 1  . Rimegepant Sulfate (NURTEC) 75 MG TBDP Take 1 tablet by mouth as needed. Take 1 tablet as needed for migraine. Do not take more than 1 in 24 hours. (Patient not taking: Reported on 07/24/2020) 10 tablet 5   No current facility-administered medications on file prior to visit.      Observations/Objective:   Vitals:   07/24/20 1340  Weight: 152 lb (68.9 kg)  Height: _0  (1.626 m)   Exam limited due to nature of phone visit. Patient is awake, alert, able to answer questions without dysarthria. She is oriented to person, place time. Able to spell WORLD backwards, 3/3 delayed recall.   Assessment and Plan:   This is an 86 yo RH woman with a history of hypertension, diabetes, rheumatoid arthritis, breast cancer, with chronic migraines and mild dementia. She continues to report good response of migraines to Botox, continue with follow-up with Dr. Tomi Likens for Botox. She has not needed triptans, we again discussed avoidance due to history of stroke/TIA. Memory stable, continue Donepezil 60m daily. Continue close supervision, she reports worsening gabapentin, discuss option of increasing dose with prescribing provider. She does not drive. Follow-up in 6-8 months, call for any changes.    Follow Up Instructions:    -I discussed the assessment and treatment plan with the patient. The patient was provided an opportunity to ask questions and all were answered. The patient  agreed with the plan and demonstrated an understanding of the instructions.   The patient was advised to call back or seek an in-person evaluation if the symptoms worsen or if the condition fails to improve as anticipated.    Total Time spent in visit with the patient was:  11:30 minutes, of which 100% of the time was spent in counseling and/or coordinating care on the above.   Pt understands and agrees with the plan of care outlined.     KCameron Sprang MD

## 2020-07-24 NOTE — Progress Notes (Addendum)
Hematology/Oncology Consult note [facility] Telephone:([phone] [phone]  Patient Care Team: Pleas Koch, NP as PCP - General (Internal Medicine) Cameron Sprang, MD as Consulting Physician (Neurology) Clent Jacks, RN as Oncology Nurse Navigator   Name of the patient: Shannon Higgins  [id number]  1935-03-09    Reason for referral-new diagnosis of ovarian cancer   Referring physician-Dr. Kenton Kingfisher  Date of visit: 07/24/20   History of presenting illness- patient is a 86 year old female with a past medical history significant for type 2 diabetes, UTIs, hypertension GERD among other medical problems.  She will is diagnosed with UTI and as a part of her Work-up had CT abdomen and pelvis with contrast.  That showed a 13.5 11.8 x 15.2 cm simple cystic appearing area within the pelvis.  Ultrasound of the pelvis showed 17 x 10.7 x 11.9 cm complex cystic midline pelvic mass.  Findings indeterminate and could reflect a cystic ovarian neoplasm.  She did have CA-125 checked which was normal at 22.9 but HD4 was elevated at 99.  Patient underwent bilateral salpingo-oophorectomy.  She has had prior hysterectomy.  Pathology showed high-grade serous carcinoma involving both ovaries but no involvement of the fallopian tube.  Omentum was negative for malignancy.  Lymph nodes not sampled.  Tumor size 4 cm high-grade.  FIGO stage IC peritoneal/ascitic fluid involvement was not identified.  Patient is here with her daughter today and is recovering from her surgery well.  At baseline prior to surgery she was independent of her ADLs and IADLs.  She has baseline peripheral neuropathy especially in her feet and has had balance issues and falls in the past.  ECOG PS- 1  Pain scale- 0   Review of systems- Review of Systems  Constitutional: Positive for malaise/fatigue. Negative for chills, fever and weight loss.  HENT: Negative for congestion, ear discharge and  nosebleeds.   Eyes: Negative for blurred vision.  Respiratory: Negative for cough, hemoptysis, sputum production, shortness of breath and wheezing.   Cardiovascular: Negative for chest pain, palpitations, orthopnea and claudication.  Gastrointestinal: Negative for abdominal pain, blood in stool, constipation, diarrhea, heartburn, melena, nausea and vomiting.  Genitourinary: Negative for dysuria, flank pain, frequency, hematuria and urgency.  Musculoskeletal: Negative for back pain, joint pain and myalgias.  Skin: Negative for rash.  Neurological: Positive for sensory change (peripheral neuropathy). Negative for dizziness, tingling, focal weakness, seizures, weakness and headaches.  Endo/Heme/Allergies: Does not bruise/bleed easily.  Psychiatric/Behavioral: Negative for depression and suicidal ideas. The patient does not have insomnia.     No Known Allergies  Patient Active Problem List   Diagnosis Date Noted  . Ovarian cancer, bilateral 07/21/2020  . S/P bilateral oophorectomy 07/21/2020  . Ovarian mass 07/14/2020  . Ovarian mass, left 07/14/2020  . Acute metabolic encephalopathy 67/34/1935  . Sepsis ([place]) 04/23/2020  . Acute cystitis without hematuria 11/21/2019  . Imbalance 09/02/2019  . Preventative health care 03/04/2019  . Insomnia 09/27/2018  . History of TIA (transient ischemic attack) 09/13/2018  . Recurrent UTI 06/20/2018  . Frequent falls 05/14/2018  . Gout 05/14/2018  . Closed fracture of medial malleolus 05/11/2018  . Type 2 diabetes mellitus with peripheral neuropathy ([place]) 05/11/2018  . Type 2 diabetes mellitus ([place]) 05/11/2018  . Orthostatic hypotension 05/08/2018  . Urinary tract infectious disease   . History of breast cancer 10/17/2017  . Major depressive disorder 06/21/2017  . Chronic low back pain 01/13/2017  . Hyperlipidemia 12/06/2016  . Glaucoma 12/06/2016  . Neuropathic  pain 12/06/2016  . Malignant melanoma of skin ([place]) 12/06/2016  . Dementia  arising in the senium and presenium ([place]) 12/06/2016  . Chronic headache disorder 12/06/2016  . Transient hypotension 06/14/2016  . Hypothyroidism 06/14/2016  . Sepsis secondary to UTI ([place]) 04/26/2012  . Anemia 04/26/2012  . Hypertensive disorder 04/26/2012  . Gastroesophageal reflux disease 04/26/2012  . Obstructive sleep apnea syndrome 04/26/2012     Past Medical History:  Diagnosis Date  . Acute tubular injury of transplanted kidney ([place]) 06/14/2016  . AKI (acute kidney injury) ([place]) 06/14/2016  . Asthma   . Breast cancer ([place]) 2015   left  . Bronchitis   . CAP (community acquired pneumonia) 04/29/2012  . Chronic sinusitis   . Confusion 12/06/2016  . Diabetes mellitus without complication ([place])   . GERD (gastroesophageal reflux disease)   . History of ankle fracture 05/14/2018   Last Assessment & Plan:  With a recent fall. Right medial malleous. Has ortho follow up soon, splinted now and tylenol helps her pain  . History of recurrent UTIs   . Hypertension   . Hypokalemia 04/26/2012  . Hyponatremia   . Hypothyroidism   . Insomnia   . Migraine   . Neuropathic pain   . Pneumonia   . Rheumatoid arthritis ([place])   . Sepsis ([place]) 04/26/2012  . Sleep apnea   . Stroke [facility])    seen in CT scan  . Thyroid disease      Past Surgical History:  Procedure Laterality Date  . ABDOMINAL HYSTERECTOMY    . BACK SURGERY    . BREAST BIOPSY Right   . BREAST LUMPECTOMY Left 2015  . CHOLECYSTECTOMY    . JOINT REPLACEMENT    . LAPAROTOMY N/A 07/14/2020   Procedure: LAPAROTOMY;  Surgeon: Gae Dry, MD;  Location: [facility] ORS;  Service: Gynecology;  Laterality: N/A;  . REPLACEMENT TOTAL KNEE Right   . SALPINGOOPHORECTOMY Bilateral 07/14/2020   Procedure: OPEN SALPINGO OOPHORECTOMY;  Surgeon: Gae Dry, MD;  Location: [facility] ORS;  Service: Gynecology;  Laterality: Bilateral;  . TOTAL SHOULDER REPLACEMENT Left     Social History   Socioeconomic History  . Marital status:  Widowed    Spouse name: Not on file  . Number of children: Not on file  . Years of education: Not on file  . Highest education level: Not on file  Occupational History  . Not on file  Tobacco Use  . Smoking status: Never Smoker  . Smokeless tobacco: Never Used  Vaping Use  . Vaping Use: Never used  Substance and Sexual Activity  . Alcohol use: No  . Drug use: No  . Sexual activity: Not on file  Other Topics Concern  . Not on file  Social History Narrative   Pt lives in 1 story home with her daughter, [name] and [name]'s husband   Has 2 adult daughters   Highest level of education: some college   Worked mainly as Web designer.   Social Determinants of Health   Financial Resource Strain: Not on file  Food Insecurity: Not on file  Transportation Needs: Not on file  Physical Activity: Not on file  Stress: Not on file  Social Connections: Not on file  Intimate Partner Violence: Not on file     Family History  Problem Relation Age of Onset  . Diabetes Daughter   . Hypertension Daughter   . Fibromyalgia Daughter   . GER disease Daughter   . Fibromyalgia Daughter   .  Crohn's disease Daughter   . Asthma Daughter   . Hypertension Mother      Current Outpatient Medications:  .  Accu-Chek FastClix Lancets MISC, USE AS INSTRUCTED TO TEST BLOOD SUGAR DAILY, Disp: 102 each, Rfl: 0 .  ACCU-CHEK GUIDE test strip, USE AS INSTRUCTED TO BLOOD SUGAR DAILY, Disp: 100 strip, Rfl: 0 .  aspirin EC 325 MG tablet, Take 325 mg by mouth daily., Disp: , Rfl:  .  blood glucose meter kit and supplies, Dispense based on patient and insurance preference. Use up to 2 times daily as directed. (FOR ICD-10 E10.9, E11.9)., Disp: 1 each, Rfl: 0 .  BOTOX 200 units SOLR, Inject 1 vial into the muscle every 3 (three) months., Disp: , Rfl:  .  donepezil (ARICEPT) 10 MG tablet, Take 1 tablet (10 mg total) by mouth at bedtime., Disp: 30 tablet, Rfl: 0 .  dorzolamide-timolol (COSOPT) 22.3-6.8 MG/ML  ophthalmic solution, Place 1 drop into both eyes 2 (two) times daily., Disp: , Rfl:  .  DULoxetine (CYMBALTA) 60 MG capsule, TAKE 1 CAPSULE EVERY DAY  FOR  DEPRESSION (Patient taking differently: Take 60 mg by mouth daily.), Disp: 90 capsule, Rfl: 1 .  fluticasone (FLONASE) 50 MCG/ACT nasal spray, Place 1 spray into both nostrils daily., Disp: 48 g, Rfl: 0 .  gabapentin (NEURONTIN) 800 MG tablet, TAKE 1 TABLET BY MOUTH 3 TIMES A DAY FOR NEUROPATHY, Disp: 270 tablet, Rfl: 1 .  latanoprost (XALATAN) 0.005 % ophthalmic solution, Place 1 drop into both eyes at bedtime., Disp: , Rfl:  .  levothyroxine (SYNTHROID) 112 MCG tablet, TAKE 1 TABLET EVERY MORNING ON AN EMPTY STOMACH WITH WATER ONLY.  NO FOOD OR OTHER MEDICATIONS FOR 30 MINUTES. (Patient taking differently: TAKE 1 TABLET EVERY MORNING ON AN EMPTY STOMACH WITH WATER ONLY.  NO FOOD OR OTHER MEDICATIONS FOR 30 MINUTES.), Disp: 90 tablet, Rfl: 1 .  meclizine (ANTIVERT) 25 MG tablet, TAKE 1 TABLET (25 MG TOTAL) BY MOUTH 2 (TWO) TIMES DAILY AS NEEDED FOR DIZZINESS., Disp: 180 tablet, Rfl: 0 .  meloxicam (MOBIC) 15 MG tablet, TAKE 1 TABLET DAILY AS NEEDED FOR PAIN. (Patient taking differently: Take 15 mg by mouth at bedtime.), Disp: 90 tablet, Rfl: 0 .  Netarsudil Dimesylate (RHOPRESSA) 0.02 % SOLN, Place 1 drop into both eyes daily at 6 (six) AM., Disp: , Rfl:  .  omeprazole (PRILOSEC) 20 MG capsule, TAKE 1 CAPSULE BY MOUTH TWICE DAILY FOR HEARTBURN. (Patient taking differently: Take 20 mg by mouth 2 (two) times daily before a meal. TAKE 1 CAPSULE BY MOUTH TWICE DAILY FOR HEARTBURN.), Disp: 180 capsule, Rfl: 1 .  pilocarpine (PILOCAR) 2 % ophthalmic solution, Place 1 drop into both eyes 4 (four) times daily. , Disp: , Rfl:  .  potassium chloride SA (KLOR-CON) 20 MEQ tablet, Take 1 tablet (20 mEq total) by mouth daily., Disp: 14 tablet, Rfl: 0 .  propranolol (INDERAL) 80 MG tablet, TAKE 1 TABLET ONE TIME DAILY FOR BLOOD PRESSURE (Patient taking differently:  Take 80 mg by mouth daily.), Disp: 90 tablet, Rfl: 1 .  rosuvastatin (CRESTOR) 10 MG tablet, TAKE 1 TABLET DAILY FOR CHOLESTEROL. (Patient taking differently: Take 10 mg by mouth every evening.), Disp: 90 tablet, Rfl: 1 .  traZODone (DESYREL) 100 MG tablet, Take two tablets at bedtime as needed for sleep (Patient taking differently: Take 200 mg by mouth at bedtime as needed for sleep. Take two tablets at bedtime as needed for sleep), Disp: 180 tablet, Rfl: 0 .  oxyCODONE-acetaminophen (PERCOCET/ROXICET) 5-325 MG tablet, Take 1-2 tablets by mouth every 4 (four) hours as needed for moderate pain., Disp: 30 tablet, Rfl: 0 .  Rimegepant Sulfate (NURTEC) 75 MG TBDP, Take 1 tablet by mouth as needed. Take 1 tablet as needed for migraine. Do not take more than 1 in 24 hours., Disp: 10 tablet, Rfl: 5   Physical exam:  Vitals:   07/21/20 1522  BP: (!) 141/66  Pulse: (!) 51  Resp: 18  Temp: 99.6 F (37.6 C)  TempSrc: Tympanic  SpO2: 95%  Weight: 153 lb (69.4 kg)  Height: 5' 4"  (1.626 m)   Physical Exam Constitutional:      Comments: Sitting in a wheelchair and appears in no acute distress  Cardiovascular:     Rate and Rhythm: Normal rate and regular rhythm.     Heart sounds: Normal heart sounds.  Pulmonary:     Effort: Pulmonary effort is normal.     Breath sounds: Normal breath sounds.  Abdominal:     General: Bowel sounds are normal.     Palpations: Abdomen is soft.     Comments: Midline surgical scar is healing well  Skin:    General: Skin is warm and dry.  Neurological:     Mental Status: She is alert and oriented to person, place, and time.        CMP Latest Ref Rng & Units 07/21/2020  Glucose 70 - 99 mg/dL 112(H)  BUN 8 - 23 mg/dL 11  Creatinine 0.44 - 1.00 mg/dL 0.84  Sodium 135 - 145 mmol/L 137  Potassium 3.5 - 5.1 mmol/L 3.0(L)  Chloride 98 - 111 mmol/L 98  CO2 22 - 32 mmol/L 27  Calcium 8.9 - 10.3 mg/dL 8.7(L)  Total Protein 6.5 - 8.1 g/dL 7.3  Total Bilirubin 0.3 -  1.2 mg/dL 0.5  Alkaline Phos 38 - 126 U/L 109  AST 15 - 41 U/L 19  ALT 0 - 44 U/L 11   CBC Latest Ref Rng & Units 07/21/2020  WBC 4.0 - 10.5 K/uL 12.6(H)  Hemoglobin 12.0 - 15.0 g/dL 11.2(L)  Hematocrit 36.0 - 46.0 % 35.1(L)  Platelets 150 - 400 K/uL 399    Assessment and plan- Patient is a 86 y.o. female with newly diagnosed high-grade serous carcinoma of the ovary FIGO stage IC pT1 cpNX cMX s/p bilateral salpingo-oophorectomy  Discussed with the patient the results of the CT abdomen which showed a central pelvic mass but did not show any evidence of local regional adenopathy.  I would recommend a CT chest to complete her staging work-up.  Pathology shows high-grade serous carcinoma involving bilateral ovaries.  Bilateral fallopian tubes and omentum not involved.  Pelvic washings negative for malignancy.  Lymph nodes not submitted.  Given the high-grade serous pathology there would be year significant risk of recurrence without adjuvant treatment and I would recommend adjuvant chemotherapy for her.  In an ideal scenario I would have recommended at least 3 cycles of carbotaxol chemotherapy.  However patient is elderly with pre-existing neuropathy.  She values her quality of life and I worry about worsening neuropathy with Taxol.  I would therefore offer her single agent carboplatin AUC 5 given IV every 3 weeks.  Discussed risks and benefits of chemotherapy including all but not limited to nausea, vomiting, low blood counts, risk of infections and hospitalizations.  Treatment will be given with a curative intent.  Patient understands and agrees to proceed as planned.  I will refer the patient for genetic  testing  I will tentatively plan to start chemotherapy in 2 weeks time.  I will refer her to vascular surgery for port placement.  Normocytic anemia: Will check ferritin iron studies B12 folate today  Cancer Staging Ovarian cancer, bilateral Staging form: Ovary, Fallopian Tube, and Primary  Peritoneal Carcinoma, AJCC 8th Edition - Pathologic stage from 07/21/2020: FIGO Stage IC, calculated as Stage Unknown (pT1c, pNX, cM0) - Signed by Sindy Guadeloupe, MD on 07/24/2020 Gross residual tumor after primary cyto-reductive surgery: Absent     Thank you for this kind referral and the opportunity to participate in the care of this patient   Visit Diagnosis 1. Ovarian cancer, bilateral   2. Goals of care, counseling/discussion   3. Port-A-Cath in place   4. Normocytic anemia     Dr. Randa Evens, MD, MPH [facility] [phone] 07/24/2020  12:38 PM     Addendum: I have discussed patients case with GYN Oncology [facility]. They have recommended platinum based doublet therapy over single agent carboplatin which has better data than single agent carboplatin. Given toxicity concerns with taxol, adding doxil at 40 mg/meter square and doing the regimen Q28 days with onpro neulasta support is reasonable if she can tolerate it. Plan is atleast 3 cycles but upto 6 cycles. We will obtain baseline echocardiogram/ MUGA scan as well.  Dr. Randa Evens, MD, MPH [facility] [phone] 07/30/2020 1:37 PM

## 2020-07-24 NOTE — Telephone Encounter (Signed)
Can you look into this?

## 2020-07-24 NOTE — Progress Notes (Signed)
START OFF PATHWAY REGIMEN - Ovarian   [id number]:Carboplatin AUC=5 q21 Days:   A cycle is every 21 days:     Carboplatin   **Always confirm dose/schedule in your pharmacy ordering system**  Patient Characteristics: Postoperative without Neoadjuvant Therapy (Pathologic Staging), Newly Diagnosed, Adjuvant Therapy, Any Stage I, Grade 3 BRCA Mutation Status: Awaiting Test Results Therapeutic Status: Postoperative without Neoadjuvant Therapy (Pathologic Staging) AJCC 8 Stage Grouping: IC AJCC M Category: cM0 AJCC T Category: pTX AJCC N Category: pNX Tumor Grade: 3 Intent of Therapy: Curative Intent, Discussed with Patient

## 2020-07-24 NOTE — Telephone Encounter (Signed)
[name] called stating that patient prescriptions have been sent to wrong pharmacy and to please resend them to [facility] in [place]

## 2020-07-26 ENCOUNTER — Other Ambulatory Visit (INDEPENDENT_AMBULATORY_CARE_PROVIDER_SITE_OTHER): Payer: Self-pay | Admitting: Nurse Practitioner

## 2020-07-27 ENCOUNTER — Other Ambulatory Visit: Payer: Self-pay | Admitting: *Deleted

## 2020-07-27 ENCOUNTER — Encounter: Payer: Self-pay | Admitting: Oncology

## 2020-07-27 ENCOUNTER — Encounter
Admission: RE | Disposition: A | Discharge status: Home / Self Care | Payer: Self-pay | Source: Home / Self Care | Attending: Vascular Surgery

## 2020-07-27 ENCOUNTER — Encounter: Payer: Self-pay | Admitting: Vascular Surgery

## 2020-07-27 ENCOUNTER — Ambulatory Visit
Admission: RE | Admit: 2020-07-27 | Discharge: 2020-07-27 | Disposition: A | Discharge status: Home / Self Care | Payer: Medicare HMO | Attending: Vascular Surgery | Admitting: Vascular Surgery

## 2020-07-27 ENCOUNTER — Other Ambulatory Visit: Payer: Self-pay

## 2020-07-27 DIAGNOSIS — C569 Malignant neoplasm of unspecified ovary: Secondary | ICD-10-CM | POA: Diagnosis not present

## 2020-07-27 DIAGNOSIS — C563 Malignant neoplasm of bilateral ovaries: Secondary | ICD-10-CM

## 2020-07-27 HISTORY — PX: PORTA CATH INSERTION: CATH118285

## 2020-07-27 SURGERY — PORTA CATH INSERTION
Anesthesia: Moderate Sedation

## 2020-07-27 MED ORDER — MIDAZOLAM HCL 2 MG/2ML IJ SOLN
INTRAMUSCULAR | Status: DC | PRN
  Administered 2020-07-27: 2 mg via INTRAVENOUS

## 2020-07-27 MED ORDER — MIDAZOLAM HCL 2 MG/ML PO SYRP: 8.0000 mg | ORAL_SOLUTION | Freq: Once | ORAL | Status: DC | PRN

## 2020-07-27 MED ORDER — HYDROMORPHONE HCL 1 MG/ML IJ SOLN: 1.0000 mg | Freq: Once | INTRAMUSCULAR | Status: DC | PRN

## 2020-07-27 MED ORDER — PROCHLORPERAZINE MALEATE 10 MG PO TABS: 10.0000 mg | ORAL_TABLET | Freq: Four times a day (QID) | ORAL | 1 refills | Status: DC | PRN

## 2020-07-27 MED ORDER — MIDAZOLAM HCL 2 MG/2ML IJ SOLN
INTRAMUSCULAR | Status: AC
  Filled 2020-07-27: qty 2

## 2020-07-27 MED ORDER — DIPHENHYDRAMINE HCL 50 MG/ML IJ SOLN: 50.0000 mg | Freq: Once | INTRAMUSCULAR | Status: DC | PRN

## 2020-07-27 MED ORDER — DEXAMETHASONE 4 MG PO TABS: 8.0000 mg | ORAL_TABLET | Freq: Every day | ORAL | 1 refills | Status: DC

## 2020-07-27 MED ORDER — ONDANSETRON HCL 8 MG PO TABS: 8.0000 mg | ORAL_TABLET | Freq: Two times a day (BID) | ORAL | 1 refills | Status: DC | PRN

## 2020-07-27 MED ORDER — SODIUM CHLORIDE 0.9 % IV SOLN
Freq: Once | INTRAVENOUS | Status: DC
  Filled 2020-07-27: qty 2

## 2020-07-27 MED ORDER — LIDOCAINE-PRILOCAINE 2.5-2.5 % EX CREA: TOPICAL_CREAM | CUTANEOUS | 3 refills | Status: DC

## 2020-07-27 MED ORDER — LORAZEPAM 0.5 MG PO TABS: 0.5000 mg | ORAL_TABLET | Freq: Four times a day (QID) | ORAL | 0 refills | Status: DC | PRN

## 2020-07-27 MED ORDER — METHYLPREDNISOLONE SODIUM SUCC 125 MG IJ SOLR: 125.0000 mg | Freq: Once | INTRAMUSCULAR | Status: DC | PRN

## 2020-07-27 MED ORDER — SODIUM CHLORIDE 0.9 % IV SOLN: INTRAVENOUS | Status: DC

## 2020-07-27 MED ORDER — FENTANYL CITRATE (PF) 100 MCG/2ML IJ SOLN
INTRAMUSCULAR | Status: DC | PRN
  Administered 2020-07-27: 50 ug via INTRAVENOUS

## 2020-07-27 MED ORDER — ONDANSETRON HCL 4 MG/2ML IJ SOLN: 4.0000 mg | Freq: Four times a day (QID) | INTRAMUSCULAR | Status: DC | PRN

## 2020-07-27 MED ORDER — FENTANYL CITRATE (PF) 100 MCG/2ML IJ SOLN
INTRAMUSCULAR | Status: AC
  Filled 2020-07-27: qty 2

## 2020-07-27 MED ORDER — CEFAZOLIN SODIUM-DEXTROSE 2-4 GM/100ML-% IV SOLN: 2.0000 g | Freq: Once | INTRAVENOUS | Status: DC

## 2020-07-27 MED ORDER — CEFAZOLIN SODIUM-DEXTROSE 2-4 GM/100ML-% IV SOLN
INTRAVENOUS | Status: AC
  Filled 2020-07-27: qty 100

## 2020-07-27 MED ORDER — CHLORHEXIDINE GLUCONATE CLOTH 2 % EX PADS
6.0000 | MEDICATED_PAD | Freq: Every day | CUTANEOUS | Status: DC
  Administered 2020-07-27: 6 via TOPICAL

## 2020-07-27 MED ORDER — FAMOTIDINE 20 MG PO TABS: 40.0000 mg | ORAL_TABLET | Freq: Once | ORAL | Status: DC | PRN

## 2020-07-27 SURGICAL SUPPLY — 14 items
ADH SKN CLS APL DERMABOND .7 (GAUZE/BANDAGES/DRESSINGS) ×1
COVER PROBE U/S 5X48 (MISCELLANEOUS) ×2 IMPLANT
COVER SURGICAL LIGHT HANDLE (MISCELLANEOUS) ×2 IMPLANT
DERMABOND ADVANCED (GAUZE/BANDAGES/DRESSINGS) ×1
DERMABOND ADVANCED .7 DNX12 (GAUZE/BANDAGES/DRESSINGS) ×1 IMPLANT
HANDLE YANKAUER SUCT BULB TIP (MISCELLANEOUS) ×2 IMPLANT
KIT PORT POWER 8FR ISP CVUE (Port) ×2 IMPLANT
PACK ANGIOGRAPHY (CUSTOM PROCEDURE TRAY) ×2 IMPLANT
PENCIL ELECTRO HAND CTR (MISCELLANEOUS) ×2 IMPLANT
SPONGE XRAY 4X4 16PLY STRL (MISCELLANEOUS) ×2 IMPLANT
SUT MNCRL AB 4-0 PS2 18 (SUTURE) ×2 IMPLANT
SUT VIC AB 3-0 SH 27 (SUTURE) ×2
SUT VIC AB 3-0 SH 27X BRD (SUTURE) ×1 IMPLANT
TUBING CONNECTING 10 (TUBING) ×4 IMPLANT

## 2020-07-27 NOTE — Telephone Encounter (Signed)
Her wbc has been high in the past as well. If overall she feels well, she can ahead with port

## 2020-07-27 NOTE — Op Note (Signed)
      [facility]       Operative Note  Date: 07/27/2020  Preoperative diagnosis:  1. Ovarian cancer  Postoperative diagnosis:  Same as above  Procedures: #1. Ultrasound guidance for vascular access to the right internal jugular vein. #2. Fluoroscopic guidance for placement of catheter. #3. Placement of CT compatible Port-A-Cath, right internal jugular vein.  Surgeon: Leotis Pain, MD.   Anesthesia: Local with moderate conscious sedation for approximately 22  minutes using 2 mg of Versed and 50 mcg of Fentanyl  Fluoroscopy time: less than 1 minute  Contrast used: 0  Estimated blood loss: 10 cc  Indication for the procedure:  The patient is a 86 y.o.female with ovarian cancer.  The patient needs a Port-A-Cath for durable venous access, chemotherapy, lab draws, and CT scans. We are asked to place this. Risks and benefits were discussed and informed consent was obtained.  Description of procedure: The patient was brought to the vascular and interventional radiology suite.  Moderate conscious sedation was administered throughout the procedure during a face to face encounter with the patient with my supervision of the RN administering medicines and monitoring the patient's vital signs, pulse oximetry, telemetry and mental status throughout from the start of the procedure until the patient was taken to the recovery room. The right neck chest and shoulder were sterilely prepped and draped, and a sterile surgical field was created. Ultrasound was used to help visualize a patent right internal jugular vein. This was then accessed under direct ultrasound guidance without difficulty with the Seldinger needle and a permanent image was recorded. A J-wire was placed. After skin nick and dilatation, the peel-away sheath was then placed over the wire. I then anesthetized an area under the clavicle approximately 1-2 fingerbreadths. A transverse incision was created and an inferior pocket  was created with electrocautery and blunt dissection. The port was then brought onto the field, placed into the pocket and secured to the chest wall with 2 Prolene sutures. The catheter was connected to the port and tunneled from the subclavicular incision to the access site. Fluoroscopic guidance was then used to cut the catheter to an appropriate length. The catheter was then placed through the peel-away sheath and the peel-away sheath was removed. The catheter tip was parked in excellent location under fluorocoscopic guidance in the cavoatrial junction. The pocket was then irrigated with antibiotic impregnated saline and the wound was closed with a running 3-0 Vicryl and a 4-0 Monocryl. The access incision was closed with a single 4-0 Monocryl. The Huber needle was used to withdraw blood and flush the port with heparinized saline. Dermabond was then placed as a dressing. The patient tolerated the procedure well and was taken to the recovery room in stable condition.   Leotis Pain 07/27/2020 12:23 PM   This note was created with Dragon Medical transcription system. Any errors in dictation are purely unintentional.

## 2020-07-29 ENCOUNTER — Inpatient Hospital Stay (HOSPITAL_BASED_OUTPATIENT_CLINIC_OR_DEPARTMENT_OTHER): Payer: Medicare HMO | Admitting: Obstetrics and Gynecology

## 2020-07-29 ENCOUNTER — Other Ambulatory Visit: Payer: Self-pay | Admitting: Oncology

## 2020-07-29 ENCOUNTER — Other Ambulatory Visit: Payer: Self-pay

## 2020-07-29 VITALS — BP 159/70 | HR 62 | Temp 99.0°F | Resp 18 | Wt 151.2 lb

## 2020-07-29 DIAGNOSIS — Z79899 Other long term (current) drug therapy: Secondary | ICD-10-CM

## 2020-07-29 DIAGNOSIS — C563 Malignant neoplasm of bilateral ovaries: Secondary | ICD-10-CM

## 2020-07-29 DIAGNOSIS — Z5111 Encounter for antineoplastic chemotherapy: Secondary | ICD-10-CM

## 2020-07-29 NOTE — Progress Notes (Signed)
Gynecologic Oncology Consult Visit   Referring Provider: Dr. 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 female who is seen in consultation from Dr. Kenton Kingfisher for high grade serous ovarian cancer.   04/24/20 admitted to [facility].  Presented to the ER with several day history of weakness, confusion, intermittent fever and thought to have UTI with sepsis.   04/23/20 - CT Abdomen Pelvis w contrast A very large, approximately 13.5 cm x 11.8 cm x 15.2 cm simple, cystic appearing area is seen within the pelvis along the midline. This extends from the lower abdomen to the region just above the urinary bladder.  No evidence of metastatic disease.  There is marked severity right-sided hydronephrosis and hydroureter with mild right perinephric inflammatory fat stranding and delayed right renal cortical enhancement. No obstructing renal calculi are identified. The dilated right ureter extends to the level of a large pelvic cyst   Seen by Dr Kenton Kingfisher for the large pelvic mass.  CA 125  22.9 HE4   99 Postmenopausal ROMA 2.65  Underwent Bilateral salpingo-oophorectomy, laparotomy, partial omentectomy with Dr. Kenton Kingfisher and Dr. Gilman Schmidt on 07/14/20.  Controlled drainage of the mass done with purse string with some spillage.  No disease seen outside ovary.    07/14/20  DIAGNOSIS:  A. OVARY AND FALLOPIAN TUBE, LEFT; SALPINGO-OOPHORECTOMY:  - HIGH-GRADE SEROUS CARCINOMA INVOLVING SEROUS CYSTADENOMA OF OVARY, SEE SUMMARY BELOW.  - FALLOPIAN TUBE NEGATIVE FOR INVASIVE AND INTRAEPITHELIAL CARCINOMA.   B. OVARY AND FALLOPIAN TUBE, RIGHT; SALPINGO-OOPHORECTOMY:  - HIGH-GRADE SEROUS CARCINOMA, ONE 1.0 CM FOCUS, INVOLVING FRAGMENTED  OVARY.  - FALLOPIAN TUBE NEGATIVE FOR INVASIVE AND INTRAEPITHELIAL CARCINOMA.   C. OMENTUM; BIOPSY:  - NEGATIVE FOR MALIGNANCY.   CANCER CASE SUMMARY: OVARY or FALLOPIAN TUBE or PRIMARY PERITONEUM  Standard(s): AJCC-UICC 8,  FIGO Cancer Report 2018   SPECIMEN  Procedure: Bilateral salpingo-oophorectomy and omental biopsy  Specimen Integrity:    Left ovary integrity: Capsule ruptured (intraoperative spillage of cyst contents)    Right ovary integrity: Fragmented  TUMOR  Tumor Site: Bilateral ovaries  Tumor Size: Greatest dimension: 4 cm  Histologic Type: High-grade serous carcinoma  Histologic Grade: High-grade  Ovarian Surface Involvement:    Left: Not identified    Right: cannot be determined due to fragmented specimen  Fallopian Tube Surface Involvement: Not identified  Other Tissue/ Organ Involvement: Not applicable  Largest Extrapelvic Peritoneal Focus: Not applicable  Peritoneal/Ascitic Fluid Involvement: Results pending, see [id number]  Chemotherapy Response Score (CRS): Not applicable   REGIONAL LYMPH NODES  Regional Lymph Nodes Status: Not applicable (no regional lymph nodes submitted)   DISTANT METASTASIS  Distant Site(s) Involved, if applicable: Not applicable   ADDENDUM:  Peritoneal/Ascitic Fluid Involvement: Not identified.  [id number]: pelvic washings negative for malignancy   FINAL PATHOLOGIC STAGE CLASSIFICATION (pTNM, AJCC 8th Edition):  pT1c1 (surgical spill),  pN not assigned (no nodes submitted)  pM - Not applicable   CA 017 7/93/90 98.1  Patient saw Dr. Janese Banks and presents today for discussion of treatment.   Problem List: Patient Active Problem List   Diagnosis Date Noted  . Ovarian cancer, bilateral 07/21/2020  . S/P bilateral oophorectomy 07/21/2020  . Ovarian mass 07/14/2020  . Ovarian mass, left 07/14/2020  . Acute metabolic encephalopathy 30/12/2328  . Sepsis ([place]) 04/23/2020  . Acute cystitis without hematuria 11/21/2019  . Imbalance 09/02/2019  . Preventative health care 03/04/2019  . Insomnia 09/27/2018  . History of TIA (transient  ischemic attack) 09/13/2018  . Recurrent UTI 06/20/2018  . Frequent falls 05/14/2018  . Gout 05/14/2018   . Closed fracture of medial malleolus 05/11/2018  . Type 2 diabetes mellitus with peripheral neuropathy ([place]) 05/11/2018  . Type 2 diabetes mellitus ([place]) 05/11/2018  . Orthostatic hypotension 05/08/2018  . Urinary tract infectious disease   . History of breast cancer 10/17/2017  . Major depressive disorder 06/21/2017  . Chronic low back pain 01/13/2017  . Hyperlipidemia 12/06/2016  . Glaucoma 12/06/2016  . Neuropathic pain 12/06/2016  . Malignant melanoma of skin ([place]) 12/06/2016  . Dementia arising in the senium and presenium ([place]) 12/06/2016  . Chronic headache disorder 12/06/2016  . Transient hypotension 06/14/2016  . Hypothyroidism 06/14/2016  . Sepsis secondary to UTI ([place]) 04/26/2012  . Anemia 04/26/2012  . Hypertensive disorder 04/26/2012  . Gastroesophageal reflux disease 04/26/2012  . Obstructive sleep apnea syndrome 04/26/2012    Past Medical History: Past Medical History:  Diagnosis Date  . Acute tubular injury of transplanted kidney ([place]) 06/14/2016  . AKI (acute kidney injury) ([place]) 06/14/2016  . Asthma   . Breast cancer ([place]) 2015   left  . Bronchitis   . CAP (community acquired pneumonia) 04/29/2012  . Chronic sinusitis   . Confusion 12/06/2016  . Diabetes mellitus without complication ([place])   . GERD (gastroesophageal reflux disease)   . History of ankle fracture 05/14/2018   Last Assessment & Plan:  With a recent fall. Right medial malleous. Has ortho follow up soon, splinted now and tylenol helps her pain  . History of recurrent UTIs   . Hypertension   . Hypokalemia 04/26/2012  . Hyponatremia   . Hypothyroidism   . Insomnia   . Migraine   . Neuropathic pain   . Pneumonia   . Rheumatoid arthritis ([place])   . Sepsis ([place]) 04/26/2012  . Sleep apnea   . Stroke [facility])    seen in CT scan  . Thyroid disease     Past Surgical History: Past Surgical History:  Procedure Laterality Date  . ABDOMINAL HYSTERECTOMY    . BACK SURGERY    . BREAST BIOPSY Right    . BREAST LUMPECTOMY Left 2015  . CHOLECYSTECTOMY    . JOINT REPLACEMENT    . LAPAROTOMY N/A 07/14/2020   Procedure: LAPAROTOMY;  Surgeon: Gae Dry, MD;  Location: [facility] ORS;  Service: Gynecology;  Laterality: N/A;  . OOPHORECTOMY    . OVARIAN CYST REMOVAL    . PORTA CATH INSERTION N/A 07/27/2020   Procedure: PORTA CATH INSERTION;  Surgeon: Algernon Huxley, MD;  Location: [facility];  Service: Cardiovascular;  Laterality: N/A;  . REPLACEMENT TOTAL KNEE Right   . SALPINGOOPHORECTOMY Bilateral 07/14/2020   Procedure: OPEN SALPINGO OOPHORECTOMY;  Surgeon: Gae Dry, MD;  Location: [facility] ORS;  Service: Gynecology;  Laterality: Bilateral;  . TOTAL SHOULDER REPLACEMENT Left     Past Gynecologic History:  Post menopausal  OB History:  OB History  Gravida Para Term Preterm AB Living  _0 SAB IAB Ectopic Multiple Live Births               # Outcome Date GA Lbr Len/2nd Weight Sex Delivery Anes PTL Lv  2 Term           1 Term             Family History: Family History  Problem Relation Age of Onset  . Diabetes Daughter   .  Hypertension Daughter   . Fibromyalgia Daughter   . GER disease Daughter   . Fibromyalgia Daughter   . Crohn's disease Daughter   . Asthma Daughter   . Hypertension Mother     Social History: Social History   Socioeconomic History  . Marital status: Widowed    Spouse name: Not on file  . Number of children: 2  . Years of education: Not on file  . Highest education level: Not on file  Occupational History  . Occupation: retired   Tobacco Use  . Smoking status: Never Smoker  . Smokeless tobacco: Never Used  Vaping Use  . Vaping Use: Never used  Substance and Sexual Activity  . Alcohol use: No  . Drug use: No  . Sexual activity: Not on file  Other Topics Concern  . Not on file  Social History Narrative   Pt lives in 1 story home with her daughter, Shannon Higgins and [name]'s husband   Has 2 adult daughters   Highest level of  education: some college   Worked mainly as Web designer.   Social Determinants of Health   Financial Resource Strain: Not on file  Food Insecurity: Not on file  Transportation Needs: Not on file  Physical Activity: Not on file  Stress: Not on file  Social Connections: Not on file  Intimate Partner Violence: Not on file   Immunization History  Administered Date(s) Administered  . Fluad Quad(high Dose 65+) 05/05/2020  . Influenza,inj,Quad PF,6+ Mos 01/29/2018, 03/04/2019  . PFIZER(Purple Top)SARS-COV-2 Vaccination 05/21/2019, 06/10/2019, 07/03/2020  . Pneumococcal Polysaccharide-23 06/20/2018  . Tdap 12/09/2018  . Zoster Recombinat (Shingrix) 03/06/2019    Allergies: No Known Allergies  Current Medications: Current Outpatient Medications  Medication Sig Dispense Refill  . Accu-Chek FastClix Lancets MISC USE AS INSTRUCTED TO TEST BLOOD SUGAR DAILY 102 each 0  . ACCU-CHEK GUIDE test strip USE AS INSTRUCTED TO BLOOD SUGAR DAILY 100 strip 0  . aspirin EC 325 MG tablet Take 325 mg by mouth daily.    . blood glucose meter kit and supplies Dispense based on patient and insurance preference. Use up to 2 times daily as directed. (FOR ICD-10 E10.9, E11.9). 1 each 0  . BOTOX 200 units SOLR Inject 1 vial into the muscle every 3 (three) months.    . dexamethasone (DECADRON) 4 MG tablet Take 2 tablets (8 mg total) by mouth daily. Start the day after carboplatin chemotherapy for 3 days. 30 tablet 1  . donepezil (ARICEPT) 10 MG tablet Take 1 tablet (10 mg total) by mouth at bedtime. 90 tablet 3  . dorzolamide-timolol (COSOPT) 22.3-6.8 MG/ML ophthalmic solution Place 1 drop into both eyes 2 (two) times daily.    . DULoxetine (CYMBALTA) 60 MG capsule TAKE 1 CAPSULE EVERY DAY  FOR  DEPRESSION (Patient taking differently: Take 60 mg by mouth daily.) 90 capsule 1  . fluticasone (FLONASE) 50 MCG/ACT nasal spray Place 1 spray into both nostrils daily. 48 g 0  . gabapentin (NEURONTIN) 800 MG  tablet TAKE 1 TABLET BY MOUTH 3 TIMES A DAY FOR NEUROPATHY 270 tablet 1  . latanoprost (XALATAN) 0.005 % ophthalmic solution Place 1 drop into both eyes at bedtime.    [name] levothyroxine (SYNTHROID) 112 MCG tablet TAKE 1 TABLET EVERY MORNING ON AN EMPTY STOMACH WITH WATER ONLY.  NO FOOD OR OTHER MEDICATIONS FOR 30 MINUTES. (Patient taking differently: TAKE 1 TABLET EVERY MORNING ON AN EMPTY STOMACH WITH WATER ONLY.  NO FOOD OR OTHER MEDICATIONS FOR 30 MINUTES.)  90 tablet 1  . lidocaine-prilocaine (EMLA) cream Apply to affected area once 30 g 3  . LORazepam (ATIVAN) 0.5 MG tablet Take 1 tablet (0.5 mg total) by mouth every 6 (six) hours as needed (Nausea or vomiting). 30 tablet 0  . meclizine (ANTIVERT) 25 MG tablet TAKE 1 TABLET (25 MG TOTAL) BY MOUTH 2 (TWO) TIMES DAILY AS NEEDED FOR DIZZINESS. 180 tablet 0  . meloxicam (MOBIC) 15 MG tablet TAKE 1 TABLET DAILY AS NEEDED FOR PAIN. (Patient not taking: Reported on 07/27/2020) 90 tablet 0  . Netarsudil Dimesylate (RHOPRESSA) 0.02 % SOLN Place 1 drop into both eyes daily at 6 (six) AM.    . omeprazole (PRILOSEC) 20 MG capsule TAKE 1 CAPSULE BY MOUTH TWICE DAILY FOR HEARTBURN. (Patient taking differently: Take 20 mg by mouth 2 (two) times daily before a meal. TAKE 1 CAPSULE BY MOUTH TWICE DAILY FOR HEARTBURN.) 180 capsule 1  . ondansetron (ZOFRAN) 8 MG tablet Take 1 tablet (8 mg total) by mouth 2 (two) times daily as needed for refractory nausea / vomiting. Start on day 3 after carboplatin chemo. 30 tablet 1  . oxyCODONE-acetaminophen (PERCOCET/ROXICET) 5-325 MG tablet Take 1-2 tablets by mouth every 4 (four) hours as needed for moderate pain. (Patient not taking: Reported on 07/24/2020) 30 tablet 0  . pilocarpine (PILOCAR) 2 % ophthalmic solution Place 1 drop into both eyes 4 (four) times daily.     . potassium chloride SA (KLOR-CON) 20 MEQ tablet Take 1 tablet (20 mEq total) by mouth daily. 14 tablet 0  . prochlorperazine (COMPAZINE) 10 MG tablet Take 1 tablet  (10 mg total) by mouth every 6 (six) hours as needed (Nausea or vomiting). 30 tablet 1  . propranolol (INDERAL) 80 MG tablet TAKE 1 TABLET ONE TIME DAILY FOR BLOOD PRESSURE (Patient taking differently: Take 80 mg by mouth daily.) 90 tablet 1  . rosuvastatin (CRESTOR) 10 MG tablet TAKE 1 TABLET DAILY FOR CHOLESTEROL. (Patient taking differently: Take 10 mg by mouth every evening.) 90 tablet 1  . traZODone (DESYREL) 100 MG tablet Take two tablets at bedtime as needed for sleep (Patient taking differently: Take 200 mg by mouth at bedtime as needed for sleep. Take two tablets at bedtime as needed for sleep) 180 tablet 0   No current facility-administered medications for this visit.   General: negative for fevers, changes in weight or night sweats Skin: negative for changes in moles or sores or rash Eyes: negative for changes in vision HEENT: negative for change in hearing, tinnitus, voice changes. Positive for headaches Pulmonary: negative for dyspnea, orthopnea, productive cough, wheezing Cardiac: negative for palpitations, pain Gastrointestinal: negative for nausea, vomiting, constipation, diarrhea, hematemesis, hematochezia Genitourinary/Sexual: negative for dysuria, retention, hematuria. Positive for urinary incontinence Ob/Gyn:  negative for abnormal bleeding, or pain Musculoskeletal: Positive for leg pain, worse w/ walking, positive for joint and back pain Hematology: negative for easy bruising, abnormal bleeding Neurologic/Psych: negative for headaches, seizures, paralysis, weakness. Some PN due to DM.   Objective:  Physical Examination:  BP (!) 159/70   Pulse 62   Temp 99 F (37.2 C) (Tympanic)   Resp 18   Wt 151 lb 3.2 oz (68.6 kg)   SpO2 96%   BMI 25.95 kg/m     ECOG Performance Status: 1 - Symptomatic but completely ambulatory  GENERAL: Patient is a well appearing female in no acute distress HEENT:  Sclera clear. Anicteric NODES:  Negative axillary, supraclavicular,  inguinal lymph node survery LUNGS:  Clear to auscultation  bilaterally.   HEART:  Regular rate and rhythm.  ABDOMEN:  Soft, nontender.  No hernias, incisions well healed. No masses or ascites EXTREMITIES:  No peripheral edema. Atraumatic. No cyanosis SKIN:  Clear with no obvious rashes or skin changes.  NEURO:  Nonfocal. Well oriented.  Appropriate affect.  Pelvic: Chaperoned by nursing EGBUS: no lesions Vagina: no lesions, no discharge or bleeding Adnexa: no palpable masses Rectovaginal: deferred  Lab Review No labs on site today  Radiologic Imaging: No imaging on site today. CT Abdomen pelvis was independently reviewed by Dr. Fransisca Connors and I agree with findings.     Assessment:  JENNAH SATCHELL is a 86 y.o. 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  Negative washings, omentum and abdominal survey as well as CT scan A/P.   CA125 not elevated at diagnosis.   History of breast cancer age 68.  Medical co-morbidities complicating care: AODM with PN, HTN, stroke. .  Plan:   Problem List Items Addressed This Visit      Endocrine   Ovarian cancer, bilateral - Primary     We discussed options for management including chemotherapy with platinum doublet.  In view of existing PN, Dr Janese Banks would favor not giving taxol with carboplatin.  Suggested carbo/doxil for 6 cycles.  If she does not tolerate well, doses can be reduced, doxil dropped or cycles reduced to 3 instead of 6.  IV port placed for chemotherapy and chest CT scan done to complete staging. ECHO to be done prior to final decision about adding Doxil.   In view of personal history of breast/HGSOC she should have germline genetic panel testing for BRCA1/2 and other mutations.  Discussed with patient and daughter that if a mutation is found then cascade testing should be done in the family.  Also discussed potential for PARP inhibitor therapy at some point in the future if  she has a recurrence, but would not do PARPi maintenance after first line chemotherapy since she had stage I disease.   In addition, would not do somatic tumor tissue testing  for mutations/HRD at this point for the same reason.   Will see her back for follow up in 4 months.   The patient's diagnosis, an outline of the further diagnostic and laboratory studies which will be required, the recommendation for surgery, and alternatives were discussed with her and her accompanying family members.  All questions were answered to their satisfaction.  A total of 60 minutes were spent with the patient/family today; 50% was spent in education, counseling and coordination of care for stage ICi HGSOC.    Verlon Au, NP  I personally interviewed and examined the patient. Agreed with the above/below plan of care. I have directly contributed to assessment and plan of care of this patient and educated and discussed with patient and family.  Mellody Drown, MD  CC:  Pleas Koch, NP [address] [place],  [place] [address] [phone]

## 2020-07-29 NOTE — Patient Instructions (Signed)
Doxorubicin Liposomal injection What is this medicine? LIPOSOMAL DOXORUBICIN (LIP oh som al dox oh ROO bi sin) is a chemotherapy drug. This medicine is used to treat many kinds of cancer like Kaposi's sarcoma, multiple myeloma, and ovarian cancer. This medicine may be used for other purposes; ask your health care provider or pharmacist if you have questions. COMMON BRAND NAME(S): Doxil, Lipodox What should I tell my health care provider before I take this medicine? They need to know if you have any of these conditions:  blood disorders  heart disease  infection (especially a virus infection such as chickenpox, cold sores, or herpes)  liver disease  recent or ongoing radiation therapy  an unusual or allergic reaction to doxorubicin, other chemotherapy agents, soybeans, other medicines, foods, dyes, or preservatives  pregnant or trying to get pregnant  breast-feeding How should I use this medicine? This drug is given as an infusion into a vein. It is administered in a hospital or clinic by a specially trained health care professional. If you have pain, swelling, burning or any unusual feeling around the site of your injection, tell your health care professional right away. Talk to your pediatrician regarding the use of this medicine in children. Special care may be needed. Overdosage: If you think you have taken too much of this medicine contact a poison control center or emergency room at once. NOTE: This medicine is only for you. Do not share this medicine with others. What if I miss a dose? It is important not to miss your dose. Call your doctor or health care professional if you are unable to keep an appointment. What may interact with this medicine? Do not take this medicine with any of the following medications:  zidovudine This medicine may also interact with the following medications:  medicines to increase blood counts like filgrastim, pegfilgrastim,  sargramostim  vaccines Talk to your doctor or health care professional before taking any of these medicines:  acetaminophen  aspirin  ibuprofen  ketoprofen  naproxen This list may not describe all possible interactions. Give your health care provider a list of all the medicines, herbs, non-prescription drugs, or dietary supplements you use. Also tell them if you smoke, drink alcohol, or use illegal drugs. Some items may interact with your medicine. What should I watch for while using this medicine? Your condition will be monitored carefully while you are receiving this medicine. You may need blood work done while you are taking this medicine. This drug may make you feel generally unwell. This is not uncommon, as chemotherapy can affect healthy cells as well as cancer cells. Report any side effects. Continue your course of treatment even though you feel ill unless your doctor tells you to stop. Your urine may turn orange-red for a few days after your dose. This is not blood. If your urine is dark or brown, call your doctor. In some cases, you may be given additional medicines to help with side effects. Follow all directions for their use. Talk to your doctor about your risk of cancer. You may be more at risk for certain types of cancers if you take this medicine. Do not become pregnant while taking this medicine or for 6 months after stopping it. Women should inform their healthcare professional if they wish to become pregnant or think they may be pregnant. Men should not father a child while taking this medicine and for 6 months after stopping it. There is a potential for serious side effects to an unborn child.   Talk to your health care professional or pharmacist for more information. Do not breast-feed an infant while taking this medicine. This medicine has caused ovarian failure in some women. This medicine may make it more difficult to get pregnant. Talk to your healthcare professional if  you are concerned about your fertility. This medicine has caused decreased sperm counts in some men. This may make it more difficult to father a child. Talk to your healthcare professional if you are concerned about your fertility. This medicine may cause a decrease in Co-Enzyme Q-10. You should make sure that you get enough Co-Enzyme Q-10 while you are taking this medicine. Discuss the foods you eat and the vitamins you take with your health care professional. What side effects may I notice from receiving this medicine? Side effects that you should report to your doctor or health care professional as soon as possible:  allergic reactions like skin rash, itching or hives, swelling of the face, lips, or tongue  low blood counts - this medicine may decrease the number of white blood cells, red blood cells and platelets. You may be at increased risk for infections and bleeding.  signs of hand-foot syndrome - tingling or burning, redness, flaking, swelling, small blisters, or small sores on the palms of your hands or the soles of your feet  signs of infection - fever or chills, cough, sore throat, pain or difficulty passing urine  signs of decreased platelets or bleeding - bruising, pinpoint red spots on the skin, black, tarry stools, blood in the urine  signs of decreased red blood cells - unusually weak or tired, fainting spells, lightheadedness  back pain, chills, facial flushing, fever, headache, tightness in the chest or throat during the infusion  breathing problems  chest pain  fast, irregular heartbeat  mouth pain, redness, sores  pain, swelling, redness at site where injected  pain, tingling, numbness in the hands or feet  swelling of ankles, feet, or hands  vomiting Side effects that usually do not require medical attention (report to your doctor or health care professional if they continue or are bothersome):  diarrhea  hair loss  loss of appetite  nail discoloration  or damage  nausea  red or watery eyes  red colored urine  stomach upset This list may not describe all possible side effects. Call your doctor for medical advice about side effects. You may report side effects to FDA at 1-800-FDA-1088. Where should I keep my medicine? This drug is given in a hospital or clinic and will not be stored at home. NOTE: This sheet is a summary. It may not cover all possible information. If you have questions about this medicine, talk to your doctor, pharmacist, or health care provider.  2021 [employer] Standard (2017-12-25 15:13:26) Carboplatin injection What is this medicine? CARBOPLATIN (KAR boe pla tin) is a chemotherapy drug. It targets fast dividing cells, like cancer cells, and causes these cells to die. This medicine is used to treat ovarian cancer and many other cancers. This medicine may be used for other purposes; ask your health care provider or pharmacist if you have questions. COMMON BRAND NAME(S): Paraplatin What should I tell my health care provider before I take this medicine? They need to know if you have any of these conditions:  blood disorders  hearing problems  kidney disease  recent or ongoing radiation therapy  an unusual or allergic reaction to carboplatin, cisplatin, other chemotherapy, other medicines, foods, dyes, or preservatives  pregnant or trying to get pregnant  breast-feeding How should I use this medicine? This drug is usually given as an infusion into a vein. It is administered in a hospital or clinic by a specially trained health care professional. Talk to your pediatrician regarding the use of this medicine in children. Special care may be needed. Overdosage: If you think you have taken too much of this medicine contact a poison control center or emergency room at once. NOTE: This medicine is only for you. Do not share this medicine with others. What if I miss a dose? It is important not to miss a dose. Call  your doctor or health care professional if you are unable to keep an appointment. What may interact with this medicine?  medicines for seizures  medicines to increase blood counts like filgrastim, pegfilgrastim, sargramostim  some antibiotics like amikacin, gentamicin, neomycin, streptomycin, tobramycin  vaccines Talk to your doctor or health care professional before taking any of these medicines:  acetaminophen  aspirin  ibuprofen  ketoprofen  naproxen This list may not describe all possible interactions. Give your health care provider a list of all the medicines, herbs, non-prescription drugs, or dietary supplements you use. Also tell them if you smoke, drink alcohol, or use illegal drugs. Some items may interact with your medicine. What should I watch for while using this medicine? Your condition will be monitored carefully while you are receiving this medicine. You will need important blood work done while you are taking this medicine. This drug may make you feel generally unwell. This is not uncommon, as chemotherapy can affect healthy cells as well as cancer cells. Report any side effects. Continue your course of treatment even though you feel ill unless your doctor tells you to stop. In some cases, you may be given additional medicines to help with side effects. Follow all directions for their use. Call your doctor or health care professional for advice if you get a fever, chills or sore throat, or other symptoms of a cold or flu. Do not treat yourself. This drug decreases your body's ability to fight infections. Try to avoid being around people who are sick. This medicine may increase your risk to bruise or bleed. Call your doctor or health care professional if you notice any unusual bleeding. Be careful brushing and flossing your teeth or using a toothpick because you may get an infection or bleed more easily. If you have any dental work done, tell your dentist you are receiving  this medicine. Avoid taking products that contain aspirin, acetaminophen, ibuprofen, naproxen, or ketoprofen unless instructed by your doctor. These medicines may hide a fever. Do not become pregnant while taking this medicine. Women should inform their doctor if they wish to become pregnant or think they might be pregnant. There is a potential for serious side effects to an unborn child. Talk to your health care professional or pharmacist for more information. Do not breast-feed an infant while taking this medicine. What side effects may I notice from receiving this medicine? Side effects that you should report to your doctor or health care professional as soon as possible:  allergic reactions like skin rash, itching or hives, swelling of the face, lips, or tongue  signs of infection - fever or chills, cough, sore throat, pain or difficulty passing urine  signs of decreased platelets or bleeding - bruising, pinpoint red spots on the skin, black, tarry stools, nosebleeds  signs of decreased red blood cells - unusually weak or tired, fainting spells, lightheadedness  breathing problems  changes  in hearing  changes in vision  chest pain  high blood pressure  low blood counts - This drug may decrease the number of white blood cells, red blood cells and platelets. You may be at increased risk for infections and bleeding.  nausea and vomiting  pain, swelling, redness or irritation at the injection site  pain, tingling, numbness in the hands or feet  problems with balance, talking, walking  trouble passing urine or change in the amount of urine Side effects that usually do not require medical attention (report to your doctor or health care professional if they continue or are bothersome):  hair loss  loss of appetite  metallic taste in the mouth or changes in taste This list may not describe all possible side effects. Call your doctor for medical advice about side effects. You may  report side effects to FDA at 1-800-FDA-1088. Where should I keep my medicine? This drug is given in a hospital or clinic and will not be stored at home. NOTE: This sheet is a summary. It may not cover all possible information. If you have questions about this medicine, talk to your doctor, pharmacist, or health care provider.  2021 [employer] Standard (2007-07-24 14:38:05) Pegfilgrastim injection What is this medicine? PEGFILGRASTIM (PEG fil gra stim) is a long-acting granulocyte colony-stimulating factor that stimulates the growth of neutrophils, a type of white blood cell important in the body's fight against infection. It is used to reduce the incidence of fever and infection in patients with certain types of cancer who are receiving chemotherapy that affects the bone marrow, and to increase survival after being exposed to high doses of radiation. This medicine may be used for other purposes; ask your health care provider or pharmacist if you have questions. COMMON BRAND NAME(S): Rexene Edison, Ziextenzo What should I tell my health care provider before I take this medicine? They need to know if you have any of these conditions:  kidney disease  latex allergy  ongoing radiation therapy  sickle cell disease  skin reactions to acrylic adhesives (On-Body Injector only)  an unusual or allergic reaction to pegfilgrastim, filgrastim, other medicines, foods, dyes, or preservatives  pregnant or trying to get pregnant  breast-feeding How should I use this medicine? This medicine is for injection under the skin. If you get this medicine at home, you will be taught how to prepare and give the pre-filled syringe or how to use the On-body Injector. Refer to the patient Instructions for Use for detailed instructions. Use exactly as directed. Tell your healthcare provider immediately if you suspect that the On-body Injector may not have performed as intended or if you  suspect the use of the On-body Injector resulted in a missed or partial dose. It is important that you put your used needles and syringes in a special sharps container. Do not put them in a trash can. If you do not have a sharps container, call your pharmacist or healthcare provider to get one. Talk to your pediatrician regarding the use of this medicine in children. While this drug may be prescribed for selected conditions, precautions do apply. Overdosage: If you think you have taken too much of this medicine contact a poison control center or emergency room at once. NOTE: This medicine is only for you. Do not share this medicine with others. What if I miss a dose? It is important not to miss your dose. Call your doctor or health care professional if you miss your dose. If you  miss a dose due to an On-body Injector failure or leakage, a new dose should be administered as soon as possible using a single prefilled syringe for manual use. What may interact with this medicine? Interactions have not been studied. This list may not describe all possible interactions. Give your health care provider a list of all the medicines, herbs, non-prescription drugs, or dietary supplements you use. Also tell them if you smoke, drink alcohol, or use illegal drugs. Some items may interact with your medicine. What should I watch for while using this medicine? Your condition will be monitored carefully while you are receiving this medicine. You may need blood work done while you are taking this medicine. Talk to your health care provider about your risk of cancer. You may be more at risk for certain types of cancer if you take this medicine. If you are going to need a MRI, CT scan, or other procedure, tell your doctor that you are using this medicine (On-Body Injector only). What side effects may I notice from receiving this medicine? Side effects that you should report to your doctor or health care professional as  soon as possible:  allergic reactions (skin rash, itching or hives, swelling of the face, lips, or tongue)  back pain  dizziness  fever  pain, redness, or irritation at site where injected  pinpoint red spots on the skin  red or dark-brown urine  shortness of breath or breathing problems  stomach or side pain, or pain at the shoulder  swelling  tiredness  trouble passing urine or change in the amount of urine  unusual bruising or bleeding Side effects that usually do not require medical attention (report to your doctor or health care professional if they continue or are bothersome):  bone pain  muscle pain This list may not describe all possible side effects. Call your doctor for medical advice about side effects. You may report side effects to FDA at 1-800-FDA-1088. Where should I keep my medicine? Keep out of the reach of children. If you are using this medicine at home, you will be instructed on how to store it. Throw away any unused medicine after the expiration date on the label. NOTE: This sheet is a summary. It may not cover all possible information. If you have questions about this medicine, talk to your doctor, pharmacist, or health care provider.  2021 [employer] Standard (2019-05-10 13:20:51)

## 2020-07-30 ENCOUNTER — Other Ambulatory Visit: Payer: Self-pay

## 2020-07-30 ENCOUNTER — Ambulatory Visit
Admission: RE | Admit: 2020-07-30 | Discharge: 2020-07-30 | Disposition: A | Discharge status: Home / Self Care | Payer: Medicare HMO | Source: Ambulatory Visit | Attending: Oncology | Admitting: Oncology

## 2020-07-30 DIAGNOSIS — C563 Malignant neoplasm of bilateral ovaries: Secondary | ICD-10-CM | POA: Diagnosis not present

## 2020-07-30 DIAGNOSIS — K449 Diaphragmatic hernia without obstruction or gangrene: Secondary | ICD-10-CM | POA: Diagnosis not present

## 2020-07-30 DIAGNOSIS — M47814 Spondylosis without myelopathy or radiculopathy, thoracic region: Secondary | ICD-10-CM | POA: Diagnosis not present

## 2020-07-30 DIAGNOSIS — I2510 Atherosclerotic heart disease of native coronary artery without angina pectoris: Secondary | ICD-10-CM | POA: Diagnosis not present

## 2020-07-30 DIAGNOSIS — I700 Atherosclerosis of aorta: Secondary | ICD-10-CM | POA: Diagnosis not present

## 2020-07-30 MED ORDER — IOHEXOL 300 MG/ML  SOLN
75.0000 mL | Freq: Once | INTRAMUSCULAR | Status: AC | PRN
  Administered 2020-07-30: 75 mL via INTRAVENOUS

## 2020-07-31 ENCOUNTER — Inpatient Hospital Stay: Payer: Medicare HMO | Attending: Oncology

## 2020-07-31 DIAGNOSIS — C562 Malignant neoplasm of left ovary: Secondary | ICD-10-CM | POA: Insufficient documentation

## 2020-07-31 DIAGNOSIS — C561 Malignant neoplasm of right ovary: Secondary | ICD-10-CM | POA: Insufficient documentation

## 2020-07-31 DIAGNOSIS — N179 Acute kidney failure, unspecified: Secondary | ICD-10-CM | POA: Insufficient documentation

## 2020-07-31 DIAGNOSIS — E860 Dehydration: Secondary | ICD-10-CM | POA: Insufficient documentation

## 2020-07-31 DIAGNOSIS — Z5111 Encounter for antineoplastic chemotherapy: Secondary | ICD-10-CM | POA: Insufficient documentation

## 2020-08-02 ENCOUNTER — Other Ambulatory Visit: Payer: Self-pay | Admitting: *Deleted

## 2020-08-02 DIAGNOSIS — C563 Malignant neoplasm of bilateral ovaries: Secondary | ICD-10-CM

## 2020-08-03 ENCOUNTER — Ambulatory Visit
Admission: RE | Admit: 2020-08-03 | Discharge: 2020-08-03 | Disposition: A | Discharge status: Home / Self Care | Payer: Medicare HMO | Source: Ambulatory Visit | Attending: Oncology | Admitting: Oncology

## 2020-08-03 ENCOUNTER — Other Ambulatory Visit: Payer: Self-pay

## 2020-08-03 DIAGNOSIS — Z5111 Encounter for antineoplastic chemotherapy: Secondary | ICD-10-CM | POA: Diagnosis not present

## 2020-08-03 DIAGNOSIS — C563 Malignant neoplasm of bilateral ovaries: Secondary | ICD-10-CM | POA: Insufficient documentation

## 2020-08-03 MED ORDER — TECHNETIUM TC 99M-LABELED RED BLOOD CELLS IV KIT
20.0000 | PACK | Freq: Once | INTRAVENOUS | Status: AC | PRN
  Administered 2020-08-03: 22.152 via INTRAVENOUS

## 2020-08-04 ENCOUNTER — Ambulatory Visit: Payer: Medicare HMO | Admitting: Internal Medicine

## 2020-08-04 ENCOUNTER — Ambulatory Visit: Payer: Medicare HMO

## 2020-08-04 ENCOUNTER — Other Ambulatory Visit: Payer: Medicare HMO

## 2020-08-04 ENCOUNTER — Other Ambulatory Visit: Payer: Self-pay

## 2020-08-04 ENCOUNTER — Inpatient Hospital Stay: Payer: Medicare HMO

## 2020-08-04 ENCOUNTER — Inpatient Hospital Stay (HOSPITAL_BASED_OUTPATIENT_CLINIC_OR_DEPARTMENT_OTHER): Payer: Medicare HMO | Admitting: Oncology

## 2020-08-04 ENCOUNTER — Encounter: Payer: Self-pay | Admitting: Oncology

## 2020-08-04 VITALS — BP 100/68 | HR 68 | Temp 98.2°F | Resp 18 | Wt 149.4 lb

## 2020-08-04 DIAGNOSIS — C563 Malignant neoplasm of bilateral ovaries: Secondary | ICD-10-CM | POA: Diagnosis not present

## 2020-08-04 DIAGNOSIS — Z5111 Encounter for antineoplastic chemotherapy: Secondary | ICD-10-CM

## 2020-08-04 DIAGNOSIS — N179 Acute kidney failure, unspecified: Secondary | ICD-10-CM

## 2020-08-04 DIAGNOSIS — C562 Malignant neoplasm of left ovary: Secondary | ICD-10-CM | POA: Diagnosis not present

## 2020-08-04 DIAGNOSIS — C561 Malignant neoplasm of right ovary: Secondary | ICD-10-CM | POA: Diagnosis not present

## 2020-08-04 DIAGNOSIS — E860 Dehydration: Secondary | ICD-10-CM | POA: Diagnosis not present

## 2020-08-04 LAB — CBC WITH DIFFERENTIAL/PLATELET
Abs Immature Granulocytes: 0.02 10*3/uL (ref 0.00–0.07)
Basophils Absolute: 0.1 10*3/uL (ref 0.0–0.1)
Basophils Relative: 1 %
Eosinophils Absolute: 0.9 10*3/uL — ABNORMAL HIGH (ref 0.0–0.5)
Eosinophils Relative: 10 %
HCT: 37.3 % (ref 36.0–46.0)
Hemoglobin: 11.6 g/dL — ABNORMAL LOW (ref 12.0–15.0)
Immature Granulocytes: 0 %
Lymphocytes Relative: 27 %
Lymphs Abs: 2.5 10*3/uL (ref 0.7–4.0)
MCH: 27.8 pg (ref 26.0–34.0)
MCHC: 31.1 g/dL (ref 30.0–36.0)
MCV: 89.4 fL (ref 80.0–100.0)
Monocytes Absolute: 1.1 10*3/uL — ABNORMAL HIGH (ref 0.1–1.0)
Monocytes Relative: 11 %
Neutro Abs: 4.7 10*3/uL (ref 1.7–7.7)
Neutrophils Relative %: 51 %
Platelets: 372 10*3/uL (ref 150–400)
RBC: 4.17 MIL/uL (ref 3.87–5.11)
RDW: 15.9 % — ABNORMAL HIGH (ref 11.5–15.5)
WBC: 9.2 10*3/uL (ref 4.0–10.5)
nRBC: 0 % (ref 0.0–0.2)

## 2020-08-04 LAB — BASIC METABOLIC PANEL
Anion gap: 12 (ref 5–15)
BUN: 25 mg/dL — ABNORMAL HIGH (ref 8–23)
CO2: 22 mmol/L (ref 22–32)
Calcium: 8.8 mg/dL — ABNORMAL LOW (ref 8.9–10.3)
Chloride: 102 mmol/L (ref 98–111)
Creatinine, Ser: 1.38 mg/dL — ABNORMAL HIGH (ref 0.44–1.00)
GFR, Estimated: 38 mL/min — ABNORMAL LOW (ref >60)
Glucose, Bld: 203 mg/dL — ABNORMAL HIGH (ref 70–99)
Potassium: 3.5 mmol/L (ref 3.5–5.1)
Sodium: 136 mmol/L (ref 135–145)

## 2020-08-04 LAB — HEPATITIS B SURFACE ANTIGEN: Hepatitis B Surface Ag: NONREACTIVE

## 2020-08-04 LAB — HEPATITIS B CORE ANTIBODY, TOTAL: Hep B Core Total Ab: NONREACTIVE

## 2020-08-04 MED ORDER — HEPARIN SOD (PORK) LOCK FLUSH 100 UNIT/ML IV SOLN
INTRAVENOUS | Status: AC
  Filled 2020-08-04: qty 5

## 2020-08-04 MED ORDER — HEPARIN SOD (PORK) LOCK FLUSH 100 UNIT/ML IV SOLN
500.0000 [IU] | Freq: Once | INTRAVENOUS | Status: AC
  Administered 2020-08-04: 500 [IU] via INTRAVENOUS
  Filled 2020-08-04: qty 5

## 2020-08-04 MED ORDER — SODIUM CHLORIDE 0.9 % IV SOLN
150.0000 mg | Freq: Once | INTRAVENOUS | Status: AC
  Administered 2020-08-04: 150 mg via INTRAVENOUS
  Filled 2020-08-04: qty 150

## 2020-08-04 MED ORDER — SODIUM CHLORIDE 0.9 % IV SOLN
294.0000 mg | Freq: Once | INTRAVENOUS | Status: AC
  Administered 2020-08-04: 290 mg via INTRAVENOUS
  Filled 2020-08-04: qty 29

## 2020-08-04 MED ORDER — SODIUM CHLORIDE 0.9 % IV SOLN
10.0000 mg | Freq: Once | INTRAVENOUS | Status: AC
  Administered 2020-08-04: 10 mg via INTRAVENOUS
  Filled 2020-08-04: qty 10

## 2020-08-04 MED ORDER — SODIUM CHLORIDE 0.9 % IV SOLN
Freq: Once | INTRAVENOUS | Status: AC
  Filled 2020-08-04: qty 250

## 2020-08-04 MED ORDER — PALONOSETRON HCL INJECTION 0.25 MG/5ML
0.2500 mg | Freq: Once | INTRAVENOUS | Status: AC
  Administered 2020-08-04: 0.25 mg via INTRAVENOUS
  Filled 2020-08-04: qty 5

## 2020-08-04 MED ORDER — HEPARIN SOD (PORK) LOCK FLUSH 100 UNIT/ML IV SOLN
500.0000 [IU] | Freq: Once | INTRAVENOUS | Status: DC | PRN
  Filled 2020-08-04: qty 5

## 2020-08-04 MED ORDER — SODIUM CHLORIDE 0.9% FLUSH
10.0000 mL | Freq: Once | INTRAVENOUS | Status: AC
  Administered 2020-08-04: 10 mL via INTRAVENOUS
  Filled 2020-08-04: qty 10

## 2020-08-04 MED ORDER — CYANOCOBALAMIN 1000 MCG/ML IJ SOLN
1000.0000 ug | Freq: Once | INTRAMUSCULAR | Status: AC
Start: 2020-08-04 — End: 2020-08-04
  Administered 2020-08-04: 1000 ug via INTRAMUSCULAR
  Filled 2020-08-04: qty 1

## 2020-08-04 NOTE — Progress Notes (Signed)
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  She will is diagnosed with UTI and as a part of her Work-up had CT abdomen and pelvis with contrast.  That showed a 13.5 11.8 x 15.2 cm simple cystic appearing area within the pelvis.  Ultrasound of the pelvis showed 17 x 10.7 x 11.9 cm complex cystic midline pelvic mass.  Findings indeterminate and could reflect a cystic ovarian neoplasm.  She did have CA-125 checked which was normal at 22.9 but HD4 was elevated at 99.  Patient underwent bilateral salpingo-oophorectomy.  She has had prior hysterectomy.  Pathology showed high-grade serous carcinoma involving both ovaries but no involvement of the fallopian tube.  Omentum was negative for malignancy.  Lymph nodes not sampled.  Tumor size 4 cm high-grade.  FIGO stage IC peritoneal/ascitic fluid involvement was not identified.   Interval history-she still feels quite fatigued.  She does get around the house but spends most of the time sedentary.  Has intermittent abdominal pain but has not used much of her oxycodone.  ECOG PS- 1 Pain scale- 0   Review of systems- Review of Systems  Constitutional: Positive for malaise/fatigue. Negative for chills, fever and weight loss.   HENT: Negative for congestion, ear discharge and nosebleeds.   Eyes: Negative for blurred vision.  Respiratory: Negative for cough, hemoptysis, sputum production, shortness of breath and wheezing.   Cardiovascular: Negative for chest pain, palpitations, orthopnea and claudication.  Gastrointestinal: Negative for abdominal pain, blood in stool, constipation, diarrhea, heartburn, melena, nausea and vomiting.  Genitourinary: Negative for dysuria, flank pain, frequency, hematuria and urgency.  Musculoskeletal: Negative for back pain, joint pain and myalgias.  Skin: Negative for rash.  Neurological: Negative for dizziness, tingling, focal weakness, seizures, weakness and headaches.  Endo/Heme/Allergies: Does not bruise/bleed easily.  Psychiatric/Behavioral: Negative for depression and suicidal ideas. The patient does not have insomnia.       No Known Allergies   Past Medical History:  Diagnosis Date  . Acute tubular injury of transplanted kidney ([place]) 06/14/2016  . AKI (acute kidney injury) ([place]) 06/14/2016  . Asthma   . Breast cancer ([place]) 2015   left  . Bronchitis   . CAP (community acquired pneumonia) 04/29/2012  . Chronic sinusitis   . Confusion 12/06/2016  . Diabetes mellitus without complication ([place])   . GERD (gastroesophageal reflux disease)   . History of ankle fracture 05/14/2018   Last Assessment & Plan:  With a recent fall. Right medial malleous. Has ortho follow up soon, splinted now and tylenol helps her pain  . History of recurrent UTIs   . Hypertension   . Hypokalemia 04/26/2012  . Hyponatremia   . Hypothyroidism   . Insomnia   . Migraine   . Neuropathic pain   . Pneumonia   . Rheumatoid arthritis ([place])   . Sepsis ([place]) 04/26/2012  .  Sleep apnea   . Stroke [facility])    seen in CT scan  . Thyroid disease      Past Surgical History:  Procedure Laterality Date  . ABDOMINAL HYSTERECTOMY    . BACK SURGERY    . BREAST BIOPSY Right   . BREAST LUMPECTOMY Left 2015   . CHOLECYSTECTOMY    . JOINT REPLACEMENT    . LAPAROTOMY N/A 07/14/2020   Procedure: LAPAROTOMY;  Surgeon: Gae Dry, MD;  Location: [facility] ORS;  Service: Gynecology;  Laterality: N/A;  . OOPHORECTOMY    . OVARIAN CYST REMOVAL    . PORTA CATH INSERTION N/A 07/27/2020   Procedure: PORTA CATH INSERTION;  Surgeon: Algernon Huxley, MD;  Location: [place] [facility];  Service: Cardiovascular;  Laterality: N/A;  . REPLACEMENT TOTAL KNEE Right   . SALPINGOOPHORECTOMY Bilateral 07/14/2020   Procedure: OPEN SALPINGO OOPHORECTOMY;  Surgeon: Gae Dry, MD;  Location: [facility] OR[facility];  Service: Gynecology;  Laterality: Bilateral;  . TOTAL SHOULDER REPLACEMENT Left     Social History   Socioeconomic History  . Marital status: Widowed    Spouse name: Not on file  . Number of children: 2  . Years of education: Not on file  . Highest education level: Not on file  Occupational History  . Occupation: retired   Tobacco Use  . Smoking status: Never Smoker  . Smokeless tobacco: Never Used  Vaping Use  . Vaping Use: Never used  Substance and Sexual Activity  . Alcohol use: No  . Drug use: No  . Sexual activity: Not on file  Other Topics Concern  . Not on file  Social History Narrative   Pt lives in 1 story home with her daughter, [name] and [name]'s husband   Has 2 adult daughters   Highest level of education: some college   Worked mainly as Web designer.   Social Determinants of Health   Financial Resource Strain: Not on file  Food Insecurity: Not on file  Transportation Needs: Not on file  Physical Activity: Not on file  Stress: Not on file  Social Connections: Not on file  Intimate Partner Violence: Not on file    Family History  Problem Relation Age of Onset  . Diabetes Daughter   . Hypertension Daughter   . Fibromyalgia Daughter   . GER disease Daughter   . Fibromyalgia Daughter   . Crohn's disease Daughter   . Asthma Daughter   . Hypertension Mother       Current Outpatient Medications:  .  Accu-Chek FastClix Lancets MISC, USE AS INSTRUCTED TO TEST BLOOD SUGAR DAILY, Disp: 102 each, Rfl: 0 .  ACCU-CHEK GUIDE test strip, USE AS INSTRUCTED TO BLOOD SUGAR DAILY, Disp: 100 strip, Rfl: 0 .  aspirin EC 325 MG tablet, Take 325 mg by mouth daily., Disp: , Rfl:  .  blood glucose meter kit and supplies, Dispense based on patient and insurance preference. Use up to 2 times daily as directed. (FOR ICD-10 E10.9, E11.9)., Disp: 1 each, Rfl: 0 .  BOTOX 200 units SOLR, Inject 1 vial into the muscle every 3 (three) months., Disp: , Rfl:  .  Cranberry-Vit C-Lactobacillus (RA CRANBERRY SUPPLEMENTS PO), Take by mouth., Disp: , Rfl:  .  dexamethasone (DECADRON) 4 MG tablet, Take 2 tablets (8 mg total) by mouth daily. Start the day after carboplatin chemotherapy for 3 days., Disp: 30 tablet, Rfl: 1 .  donepezil (ARICEPT) 10 MG tablet, Take 1 tablet (10 mg total) by mouth  at bedtime., Disp: 90 tablet, Rfl: 3 .  dorzolamide-timolol (COSOPT) 22.3-6.8 MG/ML ophthalmic solution, Place 1 drop into both eyes 2 (two) times daily., Disp: , Rfl:  .  DULoxetine (CYMBALTA) 60 MG capsule, TAKE 1 CAPSULE EVERY DAY  FOR  DEPRESSION (Patient taking differently: Take 60 mg by mouth daily.), Disp: 90 capsule, Rfl: 1 .  fluticasone (FLONASE) 50 MCG/ACT nasal spray, Place 1 spray into both nostrils daily., Disp: 48 g, Rfl: 0 .  gabapentin (NEURONTIN) 800 MG tablet, TAKE 1 TABLET BY MOUTH 3 TIMES A DAY FOR NEUROPATHY, Disp: 270 tablet, Rfl: 1 .  latanoprost (XALATAN) 0.005 % ophthalmic solution, Place 1 drop into both eyes at bedtime., Disp: , Rfl:  .  levothyroxine (SYNTHROID) 112 MCG tablet, TAKE 1 TABLET EVERY MORNING ON AN EMPTY STOMACH WITH WATER ONLY.  NO FOOD OR OTHER MEDICATIONS FOR 30 MINUTES. (Patient taking differently: TAKE 1 TABLET EVERY MORNING ON AN EMPTY STOMACH WITH WATER ONLY.  NO FOOD OR OTHER MEDICATIONS FOR 30 MINUTES.), Disp: 90 tablet, Rfl: 1 .   lidocaine-prilocaine (EMLA) cream, Apply to affected area once, Disp: 30 g, Rfl: 3 .  LORazepam (ATIVAN) 0.5 MG tablet, Take 1 tablet (0.5 mg total) by mouth every 6 (six) hours as needed (Nausea or vomiting)., Disp: 30 tablet, Rfl: 0 .  meclizine (ANTIVERT) 25 MG tablet, TAKE 1 TABLET (25 MG TOTAL) BY MOUTH 2 (TWO) TIMES DAILY AS NEEDED FOR DIZZINESS., Disp: 180 tablet, Rfl: 0 .  meloxicam (MOBIC) 15 MG tablet, TAKE 1 TABLET DAILY AS NEEDED FOR PAIN., Disp: 90 tablet, Rfl: 0 .  Netarsudil Dimesylate (RHOPRESSA) 0.02 % SOLN, Place 1 drop into both eyes daily at 6 (six) AM., Disp: , Rfl:  .  omeprazole (PRILOSEC) 20 MG capsule, TAKE 1 CAPSULE BY MOUTH TWICE DAILY FOR HEARTBURN. (Patient taking differently: Take 20 mg by mouth 2 (two) times daily before a meal. TAKE 1 CAPSULE BY MOUTH TWICE DAILY FOR HEARTBURN.), Disp: 180 capsule, Rfl: 1 .  ondansetron (ZOFRAN) 8 MG tablet, Take 1 tablet (8 mg total) by mouth 2 (two) times daily as needed for refractory nausea / vomiting. Start on day 3 after carboplatin chemo., Disp: 30 tablet, Rfl: 1 .  oxyCODONE-acetaminophen (PERCOCET/ROXICET) 5-325 MG tablet, Take 1-2 tablets by mouth every 4 (four) hours as needed for moderate pain., Disp: 30 tablet, Rfl: 0 .  pilocarpine (PILOCAR) 2 % ophthalmic solution, Place 1 drop into both eyes 4 (four) times daily. , Disp: , Rfl:  .  potassium chloride SA (KLOR-CON) 20 MEQ tablet, Take 1 tablet (20 mEq total) by mouth daily., Disp: 14 tablet, Rfl: 0 .  prochlorperazine (COMPAZINE) 10 MG tablet, Take 1 tablet (10 mg total) by mouth every 6 (six) hours as needed (Nausea or vomiting)., Disp: 30 tablet, Rfl: 1 .  propranolol (INDERAL) 80 MG tablet, TAKE 1 TABLET ONE TIME DAILY FOR BLOOD PRESSURE (Patient taking differently: Take 80 mg by mouth daily.), Disp: 90 tablet, Rfl: 1 .  rosuvastatin (CRESTOR) 10 MG tablet, TAKE 1 TABLET DAILY FOR CHOLESTEROL. (Patient taking differently: Take 10 mg by mouth every evening.), Disp: 90  tablet, Rfl: 1 .  traZODone (DESYREL) 100 MG tablet, Take two tablets at bedtime as needed for sleep (Patient taking differently: Take 200 mg by mouth at bedtime as needed for sleep. Take two tablets at bedtime as needed for sleep), Disp: 180 tablet, Rfl: 0 No current facility-administered medications for this visit.  Facility-Administered Medications Ordered in Other Visits:  .  heparin  lock flush 100 unit/mL, 500 Units, Intracatheter, Once PRN, Sindy Guadeloupe, MD  Physical exam:  Vitals:   08/04/20 0913  BP: 100/68  Pulse: 68  Resp: 18  Temp: 98.2 F (36.8 C)  TempSrc: Tympanic  SpO2: 97%  Weight: 149 lb 6.4 oz (67.8 kg)   Physical Exam Constitutional:      General: She is not in acute distress.    Comments: Sitting in a wheelchair  Cardiovascular:     Rate and Rhythm: Normal rate and regular rhythm.     Heart sounds: Normal heart sounds.  Pulmonary:     Effort: Pulmonary effort is normal.     Breath sounds: Normal breath sounds.  Abdominal:     General: Bowel sounds are normal.     Palpations: Abdomen is soft.  Musculoskeletal:     Cervical back: Normal range of motion.  Skin:    General: Skin is warm and dry.  Neurological:     Mental Status: She is alert and oriented to person, place, and time.      CMP Latest Ref Rng & Units 08/04/2020  Glucose 70 - 99 mg/dL 203(H)  BUN 8 - 23 mg/dL 25(H)  Creatinine 0.44 - 1.00 mg/dL 1.38(H)  Sodium 135 - 145 mmol/L 136  Potassium 3.5 - 5.1 mmol/L 3.5  Chloride 98 - 111 mmol/L 102  CO2 22 - 32 mmol/L 22  Calcium 8.9 - 10.3 mg/dL 8.8(L)  Total Protein 6.5 - 8.1 g/dL -  Total Bilirubin 0.3 - 1.2 mg/dL -  Alkaline Phos 38 - 126 U/L -  AST 15 - 41 U/L -  ALT 0 - 44 U/L -   CBC Latest Ref Rng & Units 08/04/2020  WBC 4.0 - 10.5 K/uL 9.2  Hemoglobin 12.0 - 15.0 g/dL 11.6(L)  Hematocrit 36.0 - 46.0 % 37.3  Platelets 150 - 400 K/uL 372    No images are attached to the encounter.  CT Chest W Contrast  Result Date:  07/30/2020 CLINICAL DATA:  High-grade serous adenocarcinoma of the ovaries. Staging examination. EXAM: CT CHEST WITH CONTRAST TECHNIQUE: Multidetector CT imaging of the chest was performed during intravenous contrast administration. CONTRAST:  52m OMNIPAQUE IOHEXOL 300 MG/ML  SOLN COMPARISON:  08/21/2018 FINDINGS: Cardiovascular: Moderate coronary artery calcification. Global cardiac size within normal limits. No pericardial effusion. The central pulmonary arteries are of normal caliber. Mild atherosclerotic calcification within the thoracic aorta. No aortic aneurysm. Right internal jugular chest port is seen with its tip within the superior right atrium. Mediastinum/Nodes: Thyroid unremarkable. No pathologic thoracic adenopathy. Small hiatal hernia. Mild circumferential thickening of the distal esophagus may reflect changes of mild esophagitis, such as reflux esophagitis. Lungs/Pleura: 7 mm nodule within the right upper lobe was, in retrospect, present on prior examination and its stability over time is in keeping with a benign etiology. However, there has developed multiple irregular pulmonary nodules within the lower lobes bilaterally demonstrating a random distribution, more severe within the left lower lobe. Index nodule within the left lower lobe measures 7 mm at axial image # 88. On the right, index nodule within the right lower lobe measures 8 mm at axial image # 81. If acute, this may reflect subacute changes of acute infection or aspiration. If chronic however, small volume metastatic pulmonary disease is not excluded. No pneumothorax or pleural effusion. The central airways are widely patent. Upper Abdomen: Cholecystectomy has been performed. Simple cortical cyst within the visualized upper pole of the left kidney. No acute abnormality within the  visualized upper abdomen. Musculoskeletal: Bilateral total shoulder arthroplasty has been performed. No lytic or blastic bone lesions are identified.  Degenerative changes are seen throughout the thoracic spine. IMPRESSION: Interval development of multiple randomly distributed, somewhat irregular pulmonary nodules, asymmetrically more severe within the left lower lobe. If acute, these may reflect subacute changes of recent infection or aspiration. If chronic, however, this can be seen in the setting of small volume metastatic disease. Short-term imaging follow-up in 6-12 weeks would be helpful in assessing for interval resolution. 7 mm right apical pulmonary nodule, stable since prior examination and safely considered benign. Moderate coronary artery calcification. Aortic Atherosclerosis (ICD10-I70.0). Electronically Signed   By: Fidela Salisbury MD   On: 07/30/2020 23:49   NM Cardiac Muga Rest  Result Date: 08/03/2020 CLINICAL DATA:  BILATERAL ovarian cancer, pre cardiotoxic chemotherapy EXAM: NUCLEAR MEDICINE CARDIAC BLOOD POOL IMAGING (MUGA) TECHNIQUE: Cardiac multi-gated acquisition was performed at rest following intravenous injection of Tc-13mlabeled red blood cells. RADIOPHARMACEUTICALS:  22.152 mCi [id number] in-vitro labeled red blood cells IV COMPARISON:  None FINDINGS: Calculated LEFT ventricular ejection fraction is 56%, normal. Study was obtained at a cardiac rate of 60 bpm. Patient was rhythmic during imaging. Cine analysis of the LEFT ventricle in 3 projections demonstrates normal LEFT ventricular wall motion. IMPRESSION: Normal LEFT ventricular ejection fraction of 56%. Normal LV wall motion. Electronically Signed   By: MaLavonia Dana.D.   On: 08/03/2020 16:06   PERIPHERAL VASCULAR CATHETERIZATION  Result Date: 07/27/2020 See op note    Assessment and plan- Patient is a [age].o. female with high-grade serous carcinoma of the ovary FIGO stage I cpT1 cpNX cMX s/p bilateral salpingo-oophorectomy here for on treatment assessment prior to cycle 1 of adjuvant carbo Doxil chemotherapy  After my discussion with Dr. BeFransisca Connorse are to  proceed with doublet chemotherapy.  Given patient's pre-existing neuropathy I will not be offering her Taxol.  I will therefore proceed with carbo AUC 5 along with Doxil at 30 mg per metered square.  She will be getting carboplatin alone today and Doxil tomorrow since we do not have the drug available today.  She will receive 1 for Neulasta tomorrow.  Baseline echocardiogram was normal.  Again discussed risks and benefits of chemotherapy including all but not limited to nausea, vomiting, low blood counts, risk of infections and hospitalization.  Treatment will be given with a curative intent.  Patient understands and agrees to proceed as planned  AKI: She will receive 1 L of IV fluids today.  I will see her back in 10 days time for possible fluids.  Treatment will be given every 4 weeks for up to 6 cycles  based on tolerance    Visit Diagnosis 1. Encounter for antineoplastic chemotherapy   2. [place]) [place]  3. Ovarian cancer, bilateral      Dr. ArRanda EvensMD, MPH [id number]/08/2020 1:42 PM

## 2020-08-04 NOTE — Progress Notes (Signed)
Tolerated carboplatin well. Patient's port left accessed. Patient returning tomorrow for Doxil and Neulasta On Pro. Patient and family aware.

## 2020-08-05 ENCOUNTER — Encounter: Payer: Self-pay | Admitting: Oncology

## 2020-08-05 ENCOUNTER — Inpatient Hospital Stay: Payer: Medicare HMO

## 2020-08-05 VITALS — BP 111/57 | HR 67 | Temp 96.8°F | Resp 20

## 2020-08-05 DIAGNOSIS — C563 Malignant neoplasm of bilateral ovaries: Secondary | ICD-10-CM

## 2020-08-05 DIAGNOSIS — C562 Malignant neoplasm of left ovary: Secondary | ICD-10-CM | POA: Diagnosis not present

## 2020-08-05 DIAGNOSIS — E860 Dehydration: Secondary | ICD-10-CM | POA: Diagnosis not present

## 2020-08-05 DIAGNOSIS — N179 Acute kidney failure, unspecified: Secondary | ICD-10-CM | POA: Diagnosis not present

## 2020-08-05 DIAGNOSIS — Z5111 Encounter for antineoplastic chemotherapy: Secondary | ICD-10-CM | POA: Diagnosis not present

## 2020-08-05 DIAGNOSIS — C561 Malignant neoplasm of right ovary: Secondary | ICD-10-CM | POA: Diagnosis not present

## 2020-08-05 LAB — HEPATITIS B SURFACE ANTIBODY, QUANTITATIVE: Hep B S AB Quant (Post): <3.1 m[IU]/mL — ABNORMAL LOW (ref >9.9)

## 2020-08-05 MED ORDER — HEPARIN SOD (PORK) LOCK FLUSH 100 UNIT/ML IV SOLN
INTRAVENOUS | Status: AC
  Filled 2020-08-05: qty 5

## 2020-08-05 MED ORDER — PEGFILGRASTIM 6 MG/0.6ML ~~LOC~~ PSKT
6.0000 mg | PREFILLED_SYRINGE | Freq: Once | SUBCUTANEOUS | Status: AC
  Administered 2020-08-05: 6 mg via SUBCUTANEOUS
  Filled 2020-08-05: qty 0.6

## 2020-08-05 MED ORDER — SODIUM CHLORIDE 0.9% FLUSH
10.0000 mL | INTRAVENOUS | Status: DC | PRN
  Administered 2020-08-05: 10 mL
  Filled 2020-08-05: qty 10

## 2020-08-05 MED ORDER — DEXTROSE 5 % IV SOLN
INTRAVENOUS | Status: DC
  Filled 2020-08-05: qty 250

## 2020-08-05 MED ORDER — DOXORUBICIN HCL LIPOSOMAL CHEMO INJECTION 2 MG/ML: 30.0000 mg/m2 | Freq: Once | INTRAVENOUS | Status: DC

## 2020-08-05 MED ORDER — HEPARIN SOD (PORK) LOCK FLUSH 100 UNIT/ML IV SOLN
500.0000 [IU] | Freq: Once | INTRAVENOUS | Status: AC | PRN
  Administered 2020-08-05: 500 [IU]
  Filled 2020-08-05: qty 5

## 2020-08-05 MED ORDER — SODIUM CHLORIDE 0.9 % IV SOLN
10.0000 mg | Freq: Once | INTRAVENOUS | Status: AC
  Administered 2020-08-05: 10 mg via INTRAVENOUS
  Filled 2020-08-05: qty 10

## 2020-08-05 MED ORDER — DOXORUBICIN HCL LIPOSOMAL CHEMO INJECTION 2 MG/ML
28.0000 mg/m2 | Freq: Once | INTRAVENOUS | Status: AC
  Administered 2020-08-05: 50 mg via INTRAVENOUS
  Filled 2020-08-05: qty 25

## 2020-08-05 NOTE — Progress Notes (Signed)
Doxil tolerated well. On Pro Neulasta applied to right arm. Educated about taking claritin

## 2020-08-05 NOTE — Addendum Note (Signed)
Addended by: Randa Evens C on: 08/05/2020 10:29 AM   Modules accepted: Orders

## 2020-08-06 ENCOUNTER — Telehealth: Payer: Self-pay

## 2020-08-06 ENCOUNTER — Other Ambulatory Visit: Payer: Self-pay

## 2020-08-06 MED ORDER — POTASSIUM CHLORIDE CRYS ER 20 MEQ PO TBCR: 20.0000 meq | EXTENDED_RELEASE_TABLET | Freq: Every day | ORAL | 0 refills | Status: DC

## 2020-08-06 NOTE — Telephone Encounter (Signed)
Ok to refill K 20 meq for 1 more week

## 2020-08-06 NOTE — Telephone Encounter (Signed)
Telephone call to patient for follow up after receiving first infusion.   No answer but left message stating we were calling to check on them.  Encouraged patient to call for any questions or concerns.   

## 2020-08-12 ENCOUNTER — Other Ambulatory Visit: Payer: Medicare HMO

## 2020-08-12 ENCOUNTER — Ambulatory Visit: Payer: Medicare HMO

## 2020-08-12 DIAGNOSIS — C563 Malignant neoplasm of bilateral ovaries: Secondary | ICD-10-CM

## 2020-08-12 NOTE — Progress Notes (Signed)
Tumor Board Documentation  MANASI DISHON was presented by Beckey Rutter, NP at our Tumor Board on 08/12/2020, which included representatives from medical oncology,radiation oncology,surgical oncology,navigation,pathology,palliative care.  Tarrah currently presents as a current patient,for new positive pathology with history of the following treatments: surgical intervention(s).  Additionally, we reviewed previous medical and familial history, history of present illness, and recent lab results along with all available histopathologic and imaging studies. The tumor board considered available treatment options and made the following recommendations: Adjuvant chemotherapy due to preexisting neuropathy, hold taxol. Recommendation for doublet chemotherapy, discussed carbo-doxil.  The following procedures/referrals were also placed: No orders of the defined types were placed in this encounter.   Clinical Trial Status: not discussed   Staging used: Pathologic Stage  National site-specific guidelines ASCO were discussed with respect to the case.  Tumor board is a meeting of clinicians from various specialty areas who evaluate and discuss patients for whom a multidisciplinary approach is being considered. Final determinations in the plan of care are those of the provider(s). The responsibility for follow up of recommendations given during tumor board is that of the provider.   Today's extended care, comprehensive team conference, Doloris was not present for the discussion and was not examined.

## 2020-08-14 ENCOUNTER — Ambulatory Visit: Payer: Medicare HMO

## 2020-08-14 ENCOUNTER — Inpatient Hospital Stay (HOSPITAL_BASED_OUTPATIENT_CLINIC_OR_DEPARTMENT_OTHER): Payer: Medicare HMO | Admitting: Oncology

## 2020-08-14 ENCOUNTER — Ambulatory Visit: Payer: Medicare HMO | Admitting: Oncology

## 2020-08-14 ENCOUNTER — Encounter: Payer: Self-pay | Admitting: Oncology

## 2020-08-14 ENCOUNTER — Inpatient Hospital Stay: Payer: Medicare HMO

## 2020-08-14 ENCOUNTER — Other Ambulatory Visit: Payer: Self-pay

## 2020-08-14 ENCOUNTER — Other Ambulatory Visit: Payer: Medicare HMO

## 2020-08-14 VITALS — BP 94/66 | HR 65 | Temp 97.9°F | Resp 16 | Wt 150.6 lb

## 2020-08-14 DIAGNOSIS — E860 Dehydration: Secondary | ICD-10-CM | POA: Diagnosis not present

## 2020-08-14 DIAGNOSIS — C562 Malignant neoplasm of left ovary: Secondary | ICD-10-CM | POA: Diagnosis not present

## 2020-08-14 DIAGNOSIS — C563 Malignant neoplasm of bilateral ovaries: Secondary | ICD-10-CM

## 2020-08-14 DIAGNOSIS — Z5111 Encounter for antineoplastic chemotherapy: Secondary | ICD-10-CM | POA: Diagnosis not present

## 2020-08-14 DIAGNOSIS — N179 Acute kidney failure, unspecified: Secondary | ICD-10-CM | POA: Diagnosis not present

## 2020-08-14 DIAGNOSIS — C561 Malignant neoplasm of right ovary: Secondary | ICD-10-CM | POA: Diagnosis not present

## 2020-08-14 LAB — CBC WITH DIFFERENTIAL/PLATELET
Abs Immature Granulocytes: 0.24 10*3/uL — ABNORMAL HIGH (ref 0.00–0.07)
Basophils Absolute: 0.1 10*3/uL (ref 0.0–0.1)
Basophils Relative: 1 %
Eosinophils Absolute: 0.1 10*3/uL (ref 0.0–0.5)
Eosinophils Relative: 1 %
HCT: 36.8 % (ref 36.0–46.0)
Hemoglobin: 11.7 g/dL — ABNORMAL LOW (ref 12.0–15.0)
Immature Granulocytes: 2 %
Lymphocytes Relative: 15 %
Lymphs Abs: 1.7 10*3/uL (ref 0.7–4.0)
MCH: 28 pg (ref 26.0–34.0)
MCHC: 31.8 g/dL (ref 30.0–36.0)
MCV: 88 fL (ref 80.0–100.0)
Monocytes Absolute: 0.4 10*3/uL (ref 0.1–1.0)
Monocytes Relative: 3 %
Neutro Abs: 9 10*3/uL — ABNORMAL HIGH (ref 1.7–7.7)
Neutrophils Relative %: 78 %
Platelets: 177 10*3/uL (ref 150–400)
RBC: 4.18 MIL/uL (ref 3.87–5.11)
RDW: 16.3 % — ABNORMAL HIGH (ref 11.5–15.5)
WBC: 11.6 10*3/uL — ABNORMAL HIGH (ref 4.0–10.5)
nRBC: 0 % (ref 0.0–0.2)

## 2020-08-14 LAB — BASIC METABOLIC PANEL
Anion gap: 10 (ref 5–15)
BUN: 21 mg/dL (ref 8–23)
CO2: 22 mmol/L (ref 22–32)
Calcium: 9.1 mg/dL (ref 8.9–10.3)
Chloride: 102 mmol/L (ref 98–111)
Creatinine, Ser: 0.91 mg/dL (ref 0.44–1.00)
GFR, Estimated: >60 mL/min (ref >60)
Glucose, Bld: 153 mg/dL — ABNORMAL HIGH (ref 70–99)
Potassium: 4.7 mmol/L (ref 3.5–5.1)
Sodium: 134 mmol/L — ABNORMAL LOW (ref 135–145)

## 2020-08-14 MED ORDER — HEPARIN SOD (PORK) LOCK FLUSH 100 UNIT/ML IV SOLN
500.0000 [IU] | Freq: Once | INTRAVENOUS | Status: AC
  Administered 2020-08-14: 500 [IU] via INTRAVENOUS
  Filled 2020-08-14: qty 5

## 2020-08-14 MED ORDER — SODIUM CHLORIDE 0.9 % IV SOLN
Freq: Once | INTRAVENOUS | Status: AC
  Filled 2020-08-14: qty 250

## 2020-08-14 MED ORDER — SODIUM CHLORIDE 0.9% FLUSH
10.0000 mL | INTRAVENOUS | Status: DC | PRN
  Administered 2020-08-14: 10 mL via INTRAVENOUS
  Filled 2020-08-14: qty 10

## 2020-08-14 NOTE — Progress Notes (Signed)
Pt done well with chemo , and she has a good appetite but not drinking as good. B/p 94/66 today. She feels that she would benefit from fluids

## 2020-08-14 NOTE — Progress Notes (Signed)
Patient received 1 liter of NS today for hydration . Tolerated well. Offers no complaints at time of discharge to home.

## 2020-08-16 NOTE — Progress Notes (Signed)
Hematology/Oncology Consult note 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 She will is diagnosed with UTI and as a part of her Work-up had CT abdomen and pelvis with contrast. That showed a 13.5 11.8 x 15.2 cm simple cystic appearing area within the pelvis. Ultrasound of the pelvis showed 17 x 10.7 x 11.9 cm complex cystic midline pelvic mass. Findings indeterminate and could reflect a cystic ovarian neoplasm. She did have CA-125 checked which was normal at 22.9 but HD4 was elevated at 99. Patient underwent bilateral salpingo-oophorectomy. She has had prior hysterectomy. Pathology showed high-grade serous carcinoma involving both ovaries but no involvement of the fallopian tube. Omentum was negative for malignancy. Lymph nodes not sampled. Tumor size 4 cm high-grade. FIGO stage IC peritoneal/ascitic fluid involvement was not identified. Patient has baseline neuropathy. Therefore carbo/doxil chosen for adjuvant chemotherapy upto 6 cycles if tolerated.   Interval history- she tolerated cycle 1 of chemotherapy well without significant side effects. Oral intake is fair and she feels like she can do better with fluid intake as well  ECOG PS- 1 Pain scale- 0   Review of systems- Review of Systems   Constitutional: Positive for malaise/fatigue. Negative for chills, fever and weight loss.  HENT: Negative for congestion, ear discharge and nosebleeds.   Eyes: Negative for blurred vision.  Respiratory: Negative for cough, hemoptysis, sputum production, shortness of breath and wheezing.   Cardiovascular: Negative for chest pain, palpitations, orthopnea and claudication.  Gastrointestinal: Negative for abdominal pain, blood in stool, constipation, diarrhea, heartburn, melena, nausea and vomiting.  Genitourinary: Negative for dysuria, flank pain, frequency, hematuria and urgency.  Musculoskeletal: Negative for back pain, joint pain and myalgias.  Skin: Negative for rash.  Neurological: Negative for dizziness, tingling, focal weakness, seizures, weakness and headaches.  Endo/Heme/Allergies: Does not bruise/bleed easily.  Psychiatric/Behavioral: Negative for depression and suicidal ideas. The patient does not have insomnia.       No Known Allergies   Past Medical History:  Diagnosis Date  . Acute tubular injury of transplanted kidney ([place]) 06/14/2016  . AKI (acute kidney injury) ([place]) 06/14/2016  . Asthma   . Breast cancer ([place]) 2015   left  . Bronchitis   . CAP (community acquired pneumonia) 04/29/2012  . Chronic sinusitis   . Confusion 12/06/2016  . Diabetes mellitus without complication ([place])   . GERD (gastroesophageal reflux disease)   . History of ankle fracture 05/14/2018   Last Assessment & Plan:  With a recent fall. Right medial malleous. Has ortho follow up soon, splinted now and tylenol helps her pain  . History of recurrent UTIs   . Hypertension   . Hypokalemia 04/26/2012  . Hyponatremia   . Hypothyroidism   . Insomnia   . Migraine   . Neuropathic pain   . Pneumonia   . Rheumatoid arthritis ([place])   . Sepsis ([place]) 04/26/2012  .  Sleep apnea   . Stroke [facility])    seen in CT scan  . Thyroid disease      Past Surgical History:  Procedure Laterality Date  . ABDOMINAL  HYSTERECTOMY    . BACK SURGERY    . BREAST BIOPSY Right   . BREAST LUMPECTOMY Left 2015  . CHOLECYSTECTOMY    . JOINT REPLACEMENT    . LAPAROTOMY N/A 07/14/2020   Procedure: LAPAROTOMY;  Surgeon: Gae Dry, MD;  Location: [facility] ORS;  Service: Gynecology;  Laterality: N/A;  . OOPHORECTOMY    . OVARIAN CYST REMOVAL    . PORTA CATH INSERTION N/A 07/27/2020   Procedure: PORTA CATH INSERTION;  Surgeon: Algernon Huxley, MD;  Location: [place] [facility];  Service: Cardiovascular;  Laterality: N/A;  . REPLACEMENT TOTAL KNEE Right   . SALPINGOOPHORECTOMY Bilateral 07/14/2020   Procedure: OPEN SALPINGO OOPHORECTOMY;  Surgeon: Gae Dry, MD;  Location: [facility] ORS;  Service: Gynecology;  Laterality: Bilateral;  . TOTAL SHOULDER REPLACEMENT Left     Social History   Socioeconomic History  . Marital status: Widowed    Spouse name: Not on file  . Number of children: 2  . Years of education: Not on file  . Highest education level: Not on file  Occupational History  . Occupation: retired   Tobacco Use  . Smoking status: Never Smoker  . Smokeless tobacco: Never Used  Vaping Use  . Vaping Use: Never used  Substance and Sexual Activity  . Alcohol use: No  . Drug use: No  . Sexual activity: Not on file  Other Topics Concern  . Not on file  Social History Narrative   Pt lives in 1 story home with her daughter, [name] and [name]'s husband   Has 2 adult daughters   Highest level of education: some college   Worked mainly as Web designer.   Social Determinants of Health   Financial Resource Strain: Not on file  Food Insecurity: Not on file  Transportation Needs: Not on file  Physical Activity: Not on file  Stress: Not on file  Social Connections: Not on file  Intimate Partner Violence: Not on file    Family History  Problem Relation Age of Onset  . Diabetes Daughter   . Hypertension Daughter   . Fibromyalgia Daughter   . GER disease Daughter   . Fibromyalgia  Daughter   . Crohn's disease Daughter   . Asthma Daughter   . Hypertension Mother      Current Outpatient Medications:  .  aspirin EC 325 MG tablet, Take 325 mg by mouth daily., Disp: , Rfl:  .  BOTOX 200 units SOLR, Inject 1 vial into the muscle every 3 (three) months., Disp: , Rfl:  .  Cranberry 1000 MG CAPS, Take 1 capsule by mouth 2 (two) times daily., Disp: , Rfl:  .  dexamethasone (DECADRON) 4 MG tablet, Take 2 tablets (8 mg total) by mouth daily. Start the day after carboplatin chemotherapy for 3 days., Disp: 30 tablet, Rfl: 1 .  donepezil (ARICEPT) 10 MG tablet, Take 1 tablet (10 mg total) by mouth at bedtime., Disp: 90 tablet, Rfl: 3 .  dorzolamide-timolol (COSOPT) 22.3-6.8 MG/ML ophthalmic solution, Place 1 drop into both eyes 2 (two) times daily., Disp: , Rfl:  .  DULoxetine (CYMBALTA) 60 MG capsule, TAKE 1 CAPSULE EVERY DAY  FOR  DEPRESSION (Patient taking differently: Take 60 mg by mouth daily.), Disp: 90 capsule, Rfl: 1 .  fluticasone (FLONASE) 50 MCG/ACT  nasal spray, Place 1 spray into both nostrils daily. (Patient taking differently: Place 1 spray into both nostrils daily as needed.), Disp: 48 g, Rfl: 0 .  gabapentin (NEURONTIN) 800 MG tablet, TAKE 1 TABLET BY MOUTH 3 TIMES A DAY FOR NEUROPATHY, Disp: 270 tablet, Rfl: 1 .  latanoprost (XALATAN) 0.005 % ophthalmic solution, Place 1 drop into both eyes at bedtime., Disp: , Rfl:  .  levothyroxine (SYNTHROID) 112 MCG tablet, TAKE 1 TABLET EVERY MORNING ON AN EMPTY STOMACH WITH WATER ONLY.  NO FOOD OR OTHER MEDICATIONS FOR 30 MINUTES. (Patient taking differently: TAKE 1 TABLET EVERY MORNING ON AN EMPTY STOMACH WITH WATER ONLY.  NO FOOD OR OTHER MEDICATIONS FOR 30 MINUTES.), Disp: 90 tablet, Rfl: 1 .  lidocaine-prilocaine (EMLA) cream, Apply to affected area once, Disp: 30 g, Rfl: 3 .  LORazepam (ATIVAN) 0.5 MG tablet, Take 1 tablet (0.5 mg total) by mouth every 6 (six) hours as needed (Nausea or vomiting)., Disp: 30 tablet, Rfl: 0 .   meclizine (ANTIVERT) 25 MG tablet, TAKE 1 TABLET (25 MG TOTAL) BY MOUTH 2 (TWO) TIMES DAILY AS NEEDED FOR DIZZINESS., Disp: 180 tablet, Rfl: 0 .  meloxicam (MOBIC) 15 MG tablet, TAKE 1 TABLET DAILY AS NEEDED FOR PAIN., Disp: 90 tablet, Rfl: 0 .  Netarsudil Dimesylate (RHOPRESSA) 0.02 % SOLN, Place 1 drop into both eyes daily at 6 (six) AM., Disp: , Rfl:  .  omeprazole (PRILOSEC) 20 MG capsule, TAKE 1 CAPSULE BY MOUTH TWICE DAILY FOR HEARTBURN. (Patient taking differently: Take 20 mg by mouth 2 (two) times daily before a meal. TAKE 1 CAPSULE BY MOUTH TWICE DAILY FOR HEARTBURN.), Disp: 180 capsule, Rfl: 1 .  oxyCODONE-acetaminophen (PERCOCET/ROXICET) 5-325 MG tablet, Take 1-2 tablets by mouth every 4 (four) hours as needed for moderate pain., Disp: 30 tablet, Rfl: 0 .  pilocarpine (PILOCAR) 2 % ophthalmic solution, Place 1 drop into both eyes 4 (four) times daily. , Disp: , Rfl:  .  potassium chloride SA (KLOR-CON) 20 MEQ tablet, Take 1 tablet (20 mEq total) by mouth daily., Disp: 14 tablet, Rfl: 0 .  propranolol (INDERAL) 80 MG tablet, TAKE 1 TABLET ONE TIME DAILY FOR BLOOD PRESSURE (Patient taking differently: Take 80 mg by mouth daily.), Disp: 90 tablet, Rfl: 1 .  rosuvastatin (CRESTOR) 10 MG tablet, TAKE 1 TABLET DAILY FOR CHOLESTEROL. (Patient taking differently: Take 10 mg by mouth every evening.), Disp: 90 tablet, Rfl: 1 .  traZODone (DESYREL) 100 MG tablet, Take two tablets at bedtime as needed for sleep (Patient taking differently: Take 200 mg by mouth at bedtime as needed for sleep. Take two tablets at bedtime as needed for sleep), Disp: 180 tablet, Rfl: 0 .  Accu-Chek FastClix Lancets MISC, USE AS INSTRUCTED TO TEST BLOOD SUGAR DAILY (Patient not taking: Reported on 08/14/2020), Disp: 102 each, Rfl: 0 .  ACCU-CHEK GUIDE test strip, USE AS INSTRUCTED TO BLOOD SUGAR DAILY (Patient not taking: Reported on 08/14/2020), Disp: 100 strip, Rfl: 0 .  blood glucose meter kit and supplies, Dispense based on  patient and insurance preference. Use up to 2 times daily as directed. (FOR ICD-10 E10.9, E11.9). (Patient not taking: Reported on 08/14/2020), Disp: 1 each, Rfl: 0 .  ondansetron (ZOFRAN) 8 MG tablet, Take 1 tablet (8 mg total) by mouth 2 (two) times daily as needed for refractory nausea / vomiting. Start on day 3 after carboplatin chemo. (Patient not taking: Reported on 08/14/2020), Disp: 30 tablet, Rfl: 1 .  prochlorperazine (COMPAZINE) 10 MG tablet,  Take 1 tablet (10 mg total) by mouth every 6 (six) hours as needed (Nausea or vomiting). (Patient not taking: Reported on 08/14/2020), Disp: 30 tablet, Rfl: 1  Physical exam:  Vitals:   08/14/20 1112  BP: 94/66  Pulse: 65  Resp: 16  Temp: 97.9 F (36.6 C)  TempSrc: Oral  Weight: 150 lb 9.6 oz (68.3 kg)   Physical Exam Constitutional:      General: She is not in acute distress. Cardiovascular:     Rate and Rhythm: Normal rate and regular rhythm.     Heart sounds: Normal heart sounds.  Pulmonary:     Effort: Pulmonary effort is normal.     Breath sounds: Normal breath sounds.  Skin:    General: Skin is warm and dry.  Neurological:     Mental Status: She is alert and oriented to person, place, and time.      CMP Latest Ref Rng & Units 08/14/2020  Glucose 70 - 99 mg/dL 153(H)  BUN 8 - 23 mg/dL 21  Creatinine 0.44 - 1.00 mg/dL 0.91  Sodium 135 - 145 mmol/L 134(L)  Potassium 3.5 - 5.1 mmol/L 4.7  Chloride 98 - 111 mmol/L 102  CO2 22 - 32 mmol/L 22  Calcium 8.9 - 10.3 mg/dL 9.1  Total Protein 6.5 - 8.1 g/dL -  Total Bilirubin 0.3 - 1.2 mg/dL -  Alkaline Phos 38 - 126 U/L -  AST 15 - 41 U/L -  ALT 0 - 44 U/L -   CBC Latest Ref Rng & Units 08/14/2020  WBC 4.0 - 10.5 K/uL 11.6(H)  Hemoglobin 12.0 - 15.0 g/dL 11.7(L)  Hematocrit 36.0 - 46.0 % 36.8  Platelets 150 - 400 K/uL 177    No images are attached to the encounter.  CT Chest W Contrast  Result Date: 07/30/2020 CLINICAL DATA:  High-grade serous adenocarcinoma of the  ovaries. Staging examination. EXAM: CT CHEST WITH CONTRAST TECHNIQUE: Multidetector CT imaging of the chest was performed during intravenous contrast administration. CONTRAST:  65m OMNIPAQUE IOHEXOL 300 MG/ML  SOLN COMPARISON:  08/21/2018 FINDINGS: Cardiovascular: Moderate coronary artery calcification. Global cardiac size within normal limits. No pericardial effusion. The central pulmonary arteries are of normal caliber. Mild atherosclerotic calcification within the thoracic aorta. No aortic aneurysm. Right internal jugular chest port is seen with its tip within the superior right atrium. Mediastinum/Nodes: Thyroid unremarkable. No pathologic thoracic adenopathy. Small hiatal hernia. Mild circumferential thickening of the distal esophagus may reflect changes of mild esophagitis, such as reflux esophagitis. Lungs/Pleura: 7 mm nodule within the right upper lobe was, in retrospect, present on prior examination and its stability over time is in keeping with a benign etiology. However, there has developed multiple irregular pulmonary nodules within the lower lobes bilaterally demonstrating a random distribution, more severe within the left lower lobe. Index nodule within the left lower lobe measures 7 mm at axial image # 88. On the right, index nodule within the right lower lobe measures 8 mm at axial image # 81. If acute, this may reflect subacute changes of acute infection or aspiration. If chronic however, small volume metastatic pulmonary disease is not excluded. No pneumothorax or pleural effusion. The central airways are widely patent. Upper Abdomen: Cholecystectomy has been performed. Simple cortical cyst within the visualized upper pole of the left kidney. No acute abnormality within the visualized upper abdomen. Musculoskeletal: Bilateral total shoulder arthroplasty has been performed. No lytic or blastic bone lesions are identified. Degenerative changes are seen throughout the thoracic spine. IMPRESSION:  Interval development of multiple randomly distributed, somewhat irregular pulmonary nodules, asymmetrically more severe within the left lower lobe. If acute, these may reflect subacute changes of recent infection or aspiration. If chronic, however, this can be seen in the setting of small volume metastatic disease. Short-term imaging follow-up in 6-12 weeks would be helpful in assessing for interval resolution. 7 mm right apical pulmonary nodule, stable since prior examination and safely considered benign. Moderate coronary artery calcification. Aortic Atherosclerosis (ICD10-I70.0). Electronically Signed   By: Fidela Salisbury MD   On: 07/30/2020 23:49   NM Cardiac Muga Rest  Result Date: 08/03/2020 CLINICAL DATA:  BILATERAL ovarian cancer, pre cardiotoxic chemotherapy EXAM: NUCLEAR MEDICINE CARDIAC BLOOD POOL IMAGING (MUGA) TECHNIQUE: Cardiac multi-gated acquisition was performed at rest following intravenous injection of Tc-26mlabeled red blood cells. RADIOPHARMACEUTICALS:  22.152 mCi [id number] in-vitro labeled red blood cells IV COMPARISON:  None FINDINGS: Calculated LEFT ventricular ejection fraction is 56%, normal. Study was obtained at a cardiac rate of 60 bpm. Patient was rhythmic during imaging. Cine analysis of the LEFT ventricle in 3 projections demonstrates normal LEFT ventricular wall motion. IMPRESSION: Normal LEFT ventricular ejection fraction of 56%. Normal LV wall motion. Electronically Signed   By: MaLavonia Dana.D.   On: 08/03/2020 16:06   PERIPHERAL VASCULAR CATHETERIZATION  Result Date: 07/27/2020 See op note    Assessment and plan- Patient is a [age].o. female with high-grade serous carcinoma of the ovary FIGO stage I cpT1 cpNX cMX s/p bilateral salpingo-oophorectomy. She is here for toxicity check post cycle 1 of adjuvant carbo doxil chemotherapy.  She has tolerated cycle 1 of chemotherapy well without any side effects. She does report ongoing fatigue and decreased oral  intake. We will plan for 1L IVF today.    I will see her back in 2 weeks for cycle 2 of chemotherapy  Visit Diagnosis 1. Ovarian cancer, bilateral   2. Dehydration      Dr. ArRanda EvensMD, MPH [id number]/17/2022 2:31 PM

## 2020-08-18 ENCOUNTER — Ambulatory Visit: Payer: Medicare HMO

## 2020-08-18 ENCOUNTER — Ambulatory Visit: Payer: Medicare HMO | Admitting: Oncology

## 2020-08-18 ENCOUNTER — Other Ambulatory Visit: Payer: Medicare HMO

## 2020-08-19 ENCOUNTER — Inpatient Hospital Stay (HOSPITAL_BASED_OUTPATIENT_CLINIC_OR_DEPARTMENT_OTHER): Payer: Medicare HMO | Admitting: Licensed Clinical Social Worker

## 2020-08-19 ENCOUNTER — Encounter: Payer: Self-pay | Admitting: Licensed Clinical Social Worker

## 2020-08-19 ENCOUNTER — Inpatient Hospital Stay: Payer: Medicare HMO

## 2020-08-19 DIAGNOSIS — Z853 Personal history of malignant neoplasm of breast: Secondary | ICD-10-CM

## 2020-08-19 DIAGNOSIS — Z5111 Encounter for antineoplastic chemotherapy: Secondary | ICD-10-CM | POA: Diagnosis not present

## 2020-08-19 DIAGNOSIS — N179 Acute kidney failure, unspecified: Secondary | ICD-10-CM | POA: Diagnosis not present

## 2020-08-19 DIAGNOSIS — Z803 Family history of malignant neoplasm of breast: Secondary | ICD-10-CM | POA: Diagnosis not present

## 2020-08-19 DIAGNOSIS — C569 Malignant neoplasm of unspecified ovary: Secondary | ICD-10-CM | POA: Diagnosis not present

## 2020-08-19 DIAGNOSIS — Z7189 Other specified counseling: Secondary | ICD-10-CM

## 2020-08-19 DIAGNOSIS — C563 Malignant neoplasm of bilateral ovaries: Secondary | ICD-10-CM

## 2020-08-19 DIAGNOSIS — E860 Dehydration: Secondary | ICD-10-CM | POA: Diagnosis not present

## 2020-08-19 DIAGNOSIS — Z95828 Presence of other vascular implants and grafts: Secondary | ICD-10-CM

## 2020-08-19 DIAGNOSIS — C562 Malignant neoplasm of left ovary: Secondary | ICD-10-CM | POA: Diagnosis not present

## 2020-08-19 DIAGNOSIS — Z8049 Family history of malignant neoplasm of other genital organs: Secondary | ICD-10-CM | POA: Diagnosis not present

## 2020-08-19 DIAGNOSIS — Z8543 Personal history of malignant neoplasm of ovary: Secondary | ICD-10-CM | POA: Diagnosis not present

## 2020-08-19 DIAGNOSIS — C561 Malignant neoplasm of right ovary: Secondary | ICD-10-CM | POA: Diagnosis not present

## 2020-08-19 MED ORDER — HEPARIN SOD (PORK) LOCK FLUSH 100 UNIT/ML IV SOLN
500.0000 [IU] | Freq: Once | INTRAVENOUS | Status: AC
  Administered 2020-08-19: 500 [IU] via INTRAVENOUS
  Filled 2020-08-19: qty 5

## 2020-08-19 MED ORDER — SODIUM CHLORIDE 0.9% FLUSH
10.0000 mL | Freq: Once | INTRAVENOUS | Status: AC
  Administered 2020-08-19: 10 mL via INTRAVENOUS
  Filled 2020-08-19: qty 10

## 2020-08-19 NOTE — Progress Notes (Signed)
REFERRING PROVIDER: Mellody Drown, MD [facility] [place],  [place] [id number]  PRIMARY PROVIDER:  Pleas Koch, NP  PRIMARY REASON FOR VISIT:  1. Ovarian cancer, bilateral   2. Personal history of breast cancer   3. Family history of breast cancer      HISTORY OF PRESENT ILLNESS:   Shannon Higgins, a 86 y.o. female, was seen for a [facility] genetics consultation at the request of Dr. Fransisca Connors due to a personal and family history of breast and ovarian cancer.  Shannon Higgins presents to clinic today to discuss the possibility of a hereditary predisposition to cancer, genetic testing, and to further clarify her future cancer risks, as well as potential cancer risks for family members.   At the age of 51, Shannon Higgins was diagnosed with left of the breast cancer, this was treated with lumpectomy and tamoxifen.  At the age of 42, Shannon Higgins was diagnosed with bilateral ovarian cancer, stage IC. She had BSO and is on chemotherapy currently.   CANCER HISTORY:  Oncology History  Ovarian cancer, bilateral  07/21/2020 Initial Diagnosis   Ovarian cancer, bilateral   07/21/2020 Cancer Staging   Staging form: Ovary, Fallopian Tube, and Primary Peritoneal Carcinoma, AJCC 8th Edition - Pathologic stage from 07/21/2020: FIGO Stage IC, calculated as Stage Unknown (pT1c, pNX, cM0) - Signed by Sindy Guadeloupe, MD on 07/24/2020 Gross residual tumor after primary cyto-reductive surgery: Absent   08/04/2020 -  Chemotherapy    Patient is on Treatment Plan: BREAST CARBOPLATIN (AUC 5) 21D         RISK FACTORS:  Menarche was at age 10.  Ovaries intact: no.  Hysterectomy: yes.  Menopausal status: postmenopausal.  HRT use: yes Colonoscopy: yes; polyps, unsure how many.   Past Medical History:  Diagnosis Date  . Acute tubular injury of transplanted kidney ([place]) 06/14/2016  . AKI (acute kidney injury) ([place]) 06/14/2016  . Asthma   . Breast cancer ([place]) 2015   left  . Bronchitis    . CAP (community acquired pneumonia) 04/29/2012  . Chronic sinusitis   . Confusion 12/06/2016  . Diabetes mellitus without complication ([place])   . Family history of breast cancer   . GERD (gastroesophageal reflux disease)   . History of ankle fracture 05/14/2018   Last Assessment & Plan:  With a recent fall. Right medial malleous. Has ortho follow up soon, splinted now and tylenol helps her pain  . History of recurrent UTIs   . Hypertension   . Hypokalemia 04/26/2012  . Hyponatremia   . Hypothyroidism   . Insomnia   . Migraine   . Neuropathic pain   . Personal history of breast cancer   . Pneumonia   . Rheumatoid arthritis ([place])   . Sepsis ([place]) 04/26/2012  . Sleep apnea   . Stroke [facility])    seen in CT scan  . Thyroid disease     Past Surgical History:  Procedure Laterality Date  . ABDOMINAL HYSTERECTOMY    . BACK SURGERY    . BREAST BIOPSY Right   . BREAST LUMPECTOMY Left 2015  . CHOLECYSTECTOMY    . JOINT REPLACEMENT    . LAPAROTOMY N/A 07/14/2020   Procedure: LAPAROTOMY;  Surgeon: Gae Dry, MD;  Location: [facility] ORS;  Service: Gynecology;  Laterality: N/A;  . OOPHORECTOMY    . OVARIAN CYST REMOVAL    . PORTA CATH INSERTION N/A 07/27/2020   Procedure: PORTA CATH INSERTION;  Surgeon: Leotis Pain  S, MD;  Location: [facility];  Service: Cardiovascular;  Laterality: N/A;  . REPLACEMENT TOTAL KNEE Right   . SALPINGOOPHORECTOMY Bilateral 07/14/2020   Procedure: OPEN SALPINGO OOPHORECTOMY;  Surgeon: Gae Dry, MD;  Location: [facility] ORS;  Service: Gynecology;  Laterality: Bilateral;  . TOTAL SHOULDER REPLACEMENT Left     Social History   Socioeconomic History  . Marital status: Widowed    Spouse name: Not on file  . Number of children: 2  . Years of education: Not on file  . Highest education level: Not on file  Occupational History  . Occupation: retired   Tobacco Use  . Smoking status: Never Smoker  . Smokeless tobacco: Never Used  Vaping Use  .  Vaping Use: Never used  Substance and Sexual Activity  . Alcohol use: No  . Drug use: No  . Sexual activity: Not on file  Other Topics Concern  . Not on file  Social History Narrative   Pt lives in 1 story home with her daughter, [name] and [name]'s husband   Has 2 adult daughters   Highest level of education: some college   Worked mainly as Web designer.   Social Determinants of Health   Financial Resource Strain: Not on file  Food Insecurity: Not on file  Transportation Needs: Not on file  Physical Activity: Not on file  Stress: Not on file  Social Connections: Not on file     FAMILY HISTORY:  We obtained a detailed, 4-generation family history.  Significant diagnoses are listed below: Family History  Problem Relation Age of Onset  . Diabetes Daughter   . Hypertension Daughter   . Fibromyalgia Daughter   . GER disease Daughter   . Fibromyalgia Daughter   . Crohn's disease Daughter   . Asthma Daughter   . Hypertension Mother   . Breast cancer Other   . Breast cancer Niece   . Uterine cancer Niece    Shannon Higgins has 2 daughters, no cancers. She had 6 sisters and 2 brothers, none had cancer. She does have 2 nieces who had breast cancer through one of her sisters that had positive genetic testing, however reportedly this mutation is coming from the father's side of the family. A niece through one of her brothers also had breast cancer, and her sister had uterine cancer. A few great nieces have also had breast cancer.  Shannon Higgins mother died at 98, no known cancers on this side of the family. Shannon Higgins father died at 73, no known cancers on this side of the family.   Shannon Higgins is unaware of previous family history of genetic testing for hereditary cancer risks. Patient's maternal ancestors are of unknown descent, and paternal ancestors are of unknown descent. There is no reported Ashkenazi Jewish ancestry. There is no known consanguinity.    GENETIC COUNSELING  ASSESSMENT: Ms. Starke is a 86 y.o. female with a personal history of breast and ovarian cancer, and a family history of breast cancer, which is somewhat suggestive of a hereditary cancer syndrome and predisposition to cancer. We, therefore, discussed and recommended the following at today's visit.   DISCUSSION: We discussed that approximately 10-20% of ovarian cancer is hereditary. Most cases of hereditary ovarian cancer are associated with BRCA1/BRCA2 genes, although there are other genes associated with hereditary ovarian and breast cancer as well. There are other genes associated with other cancers, including uterine, that we can test for. Cancers/risks are gene specific. We discussed that testing is  beneficial for several reasons including  knowing about other cancer risks, identifying potential screening and risk-reduction options that may be appropriate, and to understand if other family members could be at risk for cancer and allow them to undergo genetic testing.   We reviewed the characteristics, features and inheritance patterns of hereditary cancer syndromes. We also discussed genetic testing, including the appropriate family members to test, the process of testing, insurance coverage and turn-around-time for results. We discussed the implications of a negative, positive and/or variant of uncertain significant result. We recommended Ms. Pressley pursue genetic testing for the [employer] gene panel.   Based on Ms. Osborne personal and family history of cancer, she meets medical criteria for genetic testing. Despite that she meets criteria, she may still have an out of pocket cost. We discussed that if her out of pocket cost for testing is over $100, the laboratory will call and confirm whether she wants to proceed with testing.  If the out of pocket cost of testing is less than $100 she will be billed by the genetic testing laboratory.   PLAN: After considering the risks, benefits, and limitations,  Ms. Varas provided informed consent to pursue genetic testing and the blood sample was sent to [facility] for analysis of the [facility] panel. Results should be available within approximately 2-4 weeks' time, at which point they will be disclosed by telephone to Ms. Clarey, as will any additional recommendations warranted by these results. Ms. Egelhoff will receive a summary of her genetic counseling visit and a copy of her results once available. This information will also be available in Epic.   Ms. Lampkins questions were answered to her satisfaction today. Our contact information was provided should additional questions or concerns arise. Thank you for the referral and allowing [place] to share in the care of your patient.   Faith Rogue, MS, [facility] Genetic Counselor [email or web address] Phone: [phone]  The patient was seen for a total of 25 minutes in face-to-face genetic counseling.  Dr. Grayland Ormond was available for discussion regarding this case.   _______________________________________________________________________ For Office Staff:  Number of people involved in session: 2 Was an Intern/ student involved with case: no

## 2020-08-21 ENCOUNTER — Ambulatory Visit: Payer: Medicare HMO | Admitting: Obstetrics & Gynecology

## 2020-08-21 DIAGNOSIS — Z1231 Encounter for screening mammogram for malignant neoplasm of breast: Secondary | ICD-10-CM

## 2020-08-26 ENCOUNTER — Other Ambulatory Visit: Payer: Self-pay | Admitting: Primary Care

## 2020-08-26 DIAGNOSIS — Z1231 Encounter for screening mammogram for malignant neoplasm of breast: Secondary | ICD-10-CM

## 2020-09-01 ENCOUNTER — Encounter: Payer: Self-pay | Admitting: Oncology

## 2020-09-01 ENCOUNTER — Inpatient Hospital Stay: Payer: Medicare HMO

## 2020-09-01 ENCOUNTER — Inpatient Hospital Stay (HOSPITAL_BASED_OUTPATIENT_CLINIC_OR_DEPARTMENT_OTHER): Payer: Medicare HMO | Admitting: Oncology

## 2020-09-01 ENCOUNTER — Inpatient Hospital Stay: Payer: Medicare HMO | Attending: Oncology

## 2020-09-01 ENCOUNTER — Other Ambulatory Visit: Payer: Self-pay

## 2020-09-01 VITALS — BP 121/70 | HR 63 | Temp 98.2°F | Resp 16 | Ht 64.0 in | Wt 153.4 lb

## 2020-09-01 DIAGNOSIS — Z5111 Encounter for antineoplastic chemotherapy: Secondary | ICD-10-CM | POA: Diagnosis not present

## 2020-09-01 DIAGNOSIS — C563 Malignant neoplasm of bilateral ovaries: Secondary | ICD-10-CM

## 2020-09-01 DIAGNOSIS — G4700 Insomnia, unspecified: Secondary | ICD-10-CM

## 2020-09-01 DIAGNOSIS — Z5189 Encounter for other specified aftercare: Secondary | ICD-10-CM | POA: Diagnosis not present

## 2020-09-01 LAB — BASIC METABOLIC PANEL
Anion gap: 11 (ref 5–15)
BUN: 16 mg/dL (ref 8–23)
CO2: 26 mmol/L (ref 22–32)
Calcium: 9.1 mg/dL (ref 8.9–10.3)
Chloride: 100 mmol/L (ref 98–111)
Creatinine, Ser: 0.84 mg/dL (ref 0.44–1.00)
GFR, Estimated: >60 mL/min (ref >60)
Glucose, Bld: 177 mg/dL — ABNORMAL HIGH (ref 70–99)
Potassium: 4.2 mmol/L (ref 3.5–5.1)
Sodium: 137 mmol/L (ref 135–145)

## 2020-09-01 LAB — CBC WITH DIFFERENTIAL/PLATELET
Abs Immature Granulocytes: 0.09 10*3/uL — ABNORMAL HIGH (ref 0.00–0.07)
Basophils Absolute: 0.1 10*3/uL (ref 0.0–0.1)
Basophils Relative: 1 %
Eosinophils Absolute: 0.1 10*3/uL (ref 0.0–0.5)
Eosinophils Relative: 1 %
HCT: 34.3 % — ABNORMAL LOW (ref 36.0–46.0)
Hemoglobin: 10.6 g/dL — ABNORMAL LOW (ref 12.0–15.0)
Immature Granulocytes: 1 %
Lymphocytes Relative: 22 %
Lymphs Abs: 2.5 10*3/uL (ref 0.7–4.0)
MCH: 28.3 pg (ref 26.0–34.0)
MCHC: 30.9 g/dL (ref 30.0–36.0)
MCV: 91.5 fL (ref 80.0–100.0)
Monocytes Absolute: 1.5 10*3/uL — ABNORMAL HIGH (ref 0.1–1.0)
Monocytes Relative: 14 %
Neutro Abs: 6.8 10*3/uL (ref 1.7–7.7)
Neutrophils Relative %: 61 %
Platelets: 447 10*3/uL — ABNORMAL HIGH (ref 150–400)
RBC: 3.75 MIL/uL — ABNORMAL LOW (ref 3.87–5.11)
RDW: 17.2 % — ABNORMAL HIGH (ref 11.5–15.5)
WBC: 11.1 10*3/uL — ABNORMAL HIGH (ref 4.0–10.5)
nRBC: 0 % (ref 0.0–0.2)

## 2020-09-01 MED ORDER — TRAZODONE HCL 100 MG PO TABS: ORAL_TABLET | ORAL | 0 refills | Status: DC

## 2020-09-01 MED ORDER — SODIUM CHLORIDE 0.9 % IV SOLN
358.5000 mg | Freq: Once | INTRAVENOUS | Status: AC
  Administered 2020-09-01: 360 mg via INTRAVENOUS
  Filled 2020-09-01: qty 36

## 2020-09-01 MED ORDER — PEGFILGRASTIM 6 MG/0.6ML ~~LOC~~ PSKT
6.0000 mg | PREFILLED_SYRINGE | Freq: Once | SUBCUTANEOUS | Status: AC
  Administered 2020-09-01: 6 mg via SUBCUTANEOUS
  Filled 2020-09-01: qty 0.6

## 2020-09-01 MED ORDER — DEXTROSE 5 % IV SOLN
INTRAVENOUS | Status: DC
  Filled 2020-09-01: qty 250

## 2020-09-01 MED ORDER — PALONOSETRON HCL INJECTION 0.25 MG/5ML
0.2500 mg | Freq: Once | INTRAVENOUS | Status: AC
  Administered 2020-09-01: 0.25 mg via INTRAVENOUS
  Filled 2020-09-01: qty 5

## 2020-09-01 MED ORDER — HEPARIN SOD (PORK) LOCK FLUSH 100 UNIT/ML IV SOLN
500.0000 [IU] | Freq: Once | INTRAVENOUS | Status: AC | PRN
  Administered 2020-09-01: 500 [IU]
  Filled 2020-09-01: qty 5

## 2020-09-01 MED ORDER — SODIUM CHLORIDE 0.9 % IV SOLN
Freq: Once | INTRAVENOUS | Status: AC
Start: 2020-09-01 — End: 2020-09-01
  Filled 2020-09-01: qty 250

## 2020-09-01 MED ORDER — SODIUM CHLORIDE 0.9% FLUSH
10.0000 mL | INTRAVENOUS | Status: DC | PRN
  Administered 2020-09-01: 10 mL
  Filled 2020-09-01: qty 10

## 2020-09-01 MED ORDER — SODIUM CHLORIDE 0.9 % IV SOLN
Freq: Once | INTRAVENOUS | Status: DC
  Filled 2020-09-01: qty 250

## 2020-09-01 MED ORDER — SODIUM CHLORIDE 0.9 % IV SOLN
10.0000 mg | Freq: Once | INTRAVENOUS | Status: AC
  Administered 2020-09-01: 10 mg via INTRAVENOUS
  Filled 2020-09-01: qty 10

## 2020-09-01 MED ORDER — SODIUM CHLORIDE 0.9 % IV SOLN
150.0000 mg | Freq: Once | INTRAVENOUS | Status: AC
  Administered 2020-09-01: 150 mg via INTRAVENOUS
  Filled 2020-09-01: qty 150

## 2020-09-01 MED ORDER — DOXORUBICIN HCL LIPOSOMAL CHEMO INJECTION 2 MG/ML
28.0000 mg/m2 | Freq: Once | INTRAVENOUS | Status: AC
  Administered 2020-09-01: 50 mg via INTRAVENOUS
  Filled 2020-09-01: qty 25

## 2020-09-01 NOTE — Progress Notes (Signed)
Pt got up from bed and tried to get walker and fell. She hurt her back. Out of potassium want to know if still needs it

## 2020-09-01 NOTE — Patient Instructions (Signed)
[facility]  Discharge Instructions: Thank you for choosing [facility] to provide your oncology and hematology care.  If you have a lab appointment with the [facility], please go directly to the [facility] and check in at the registration area.  Wear comfortable clothing and clothing appropriate for easy access to any Portacath or PICC line.   We strive to give you quality time with your provider. You may need to reschedule your appointment if you arrive late (15 or more minutes).  Arriving late affects you and other patients whose appointments are after yours.  Also, if you miss three or more appointments without notifying the office, you may be dismissed from the clinic at the provider's discretion.      For prescription refill requests, have your pharmacy contact our office and allow 72 hours for refills to be completed.    Today you received the following chemotherapy and/or immunotherapy agents: Doxil, Carboplatin     To help prevent nausea and vomiting after your treatment, we encourage you to take your nausea medication as directed.  BELOW ARE SYMPTOMS THAT SHOULD BE REPORTED IMMEDIATELY: . *FEVER GREATER THAN 100.4 F (38 C) OR HIGHER . *CHILLS OR SWEATING . *NAUSEA AND VOMITING THAT IS NOT CONTROLLED WITH YOUR NAUSEA MEDICATION . *UNUSUAL SHORTNESS OF BREATH . *UNUSUAL BRUISING OR BLEEDING . *URINARY PROBLEMS (pain or burning when urinating, or frequent urination) . *BOWEL PROBLEMS (unusual diarrhea, constipation, pain near the anus) . TENDERNESS IN MOUTH AND THROAT WITH OR WITHOUT PRESENCE OF ULCERS (sore throat, sores in mouth, or a toothache) . UNUSUAL RASH, SWELLING OR PAIN  . UNUSUAL VAGINAL DISCHARGE OR ITCHING   Items with * indicate a potential emergency and should be followed up as soon as possible or go to the Emergency Department if any problems should occur.  Please show the CHEMOTHERAPY ALERT CARD or  IMMUNOTHERAPY ALERT CARD at check-in to the Emergency Department and triage nurse.  Should you have questions after your visit or need to cancel or reschedule your appointment, please contact [facility]  [phone] and follow the prompts.  Office hours are 8:00 a.m. to 4:30 p.m. Monday - Friday. Please note that voicemails left after 4:00 p.m. may not be returned until the following business day.  We are closed weekends and major holidays. You have access to a nurse at all times for urgent questions. Please call the main number to the clinic [phone] and follow the prompts.  For any non-urgent questions, you may also contact your provider using MyChart. We now offer e-Visits for anyone 41 and older to request care online for non-urgent symptoms. For details visit [email or web address].   Also download the MyChart app! Go to the app store, search "MyChart", open the app, select [facility], and log in with your MyChart username and password.  Due to Covid, a mask is required upon entering the hospital/clinic. If you do not have a mask, one will be given to you upon arrival. For doctor visits, patients may have 1 support person aged 23 or older with them. For treatment visits, patients cannot have anyone with them due to current Covid guidelines and our immunocompromised population.

## 2020-09-01 NOTE — Progress Notes (Signed)
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 She will is diagnosed with UTI and as a part of her Work-up had CT abdomen and pelvis with contrast. That showed a 13.5 11.8 x 15.2 cm simple cystic appearing area within the pelvis. Ultrasound of the pelvis showed 17 x 10.7 x 11.9 cm complex cystic midline pelvic mass. Findings indeterminate and could reflect a cystic ovarian neoplasm. She did have CA-125 checked which was normal at 22.9 but HD4 was elevated at 99. Patient underwent bilateral salpingo-oophorectomy. She has had prior hysterectomy. Pathology showed high-grade serous carcinoma involving both ovaries but no involvement of the fallopian tube. Omentum was negative for malignancy. Lymph nodes not sampled. Tumor size 4 cm high-grade. FIGO stage IC peritoneal/ascitic fluid involvement was not identified. Patient has baseline neuropathy. Therefore carbo/doxil chosen for adjuvant chemotherapy upto 6 cycles if tolerated.   Interval history-patient had a fall on her back about 4 days ago when she tripped at night.  She reports some back pain which is acute on chronic and she has not required any pain medications for this.  ECOG PS- 2 Pain scale- 0 Opioid  associated constipation- no  Review of systems- Review of Systems  Constitutional: Positive for malaise/fatigue.  Musculoskeletal: Positive for back pain.      No Known Allergies   Past Medical History:  Diagnosis Date  . Acute tubular injury of transplanted kidney ([place]) 06/14/2016  . AKI (acute kidney injury) ([place]) 06/14/2016  . Asthma   . Breast cancer ([place]) 2015   left  . Bronchitis   . CAP (community acquired pneumonia) 04/29/2012  . Chronic sinusitis   . Confusion 12/06/2016  . Diabetes mellitus without complication ([place])   . Family history of breast cancer   . GERD (gastroesophageal reflux disease)   . History of ankle fracture 05/14/2018   Last Assessment & Plan:  With a recent fall. Right medial malleous. Has ortho follow up soon, splinted now and tylenol helps her pain  . History of recurrent UTIs   . Hypertension   . Hypokalemia 04/26/2012  . Hyponatremia   . Hypothyroidism   . Insomnia   . Migraine   . Neuropathic pain   . Ovarian cancer ([place])   . Personal history of breast cancer   . Pneumonia   . Rheumatoid arthritis ([place])   . Sepsis ([place]) 04/26/2012  . Sleep apnea   . Stroke [facility])    seen in CT scan  . Thyroid disease      Past Surgical History:  Procedure Laterality Date  . ABDOMINAL HYSTERECTOMY    . BACK SURGERY    . BREAST BIOPSY Right   . BREAST LUMPECTOMY Left 2015  . CHOLECYSTECTOMY    . JOINT REPLACEMENT    . LAPAROTOMY N/A 07/14/2020   Procedure: LAPAROTOMY;  Surgeon: Gae Dry, MD;  Location:  [facility] ORS;  Service: Gynecology;  Laterality: N/A;  . OOPHORECTOMY    . OVARIAN CYST REMOVAL    . PORTA CATH INSERTION N/A 07/27/2020   Procedure: PORTA CATH INSERTION;  Surgeon: Algernon Huxley, MD;  Location: [place] [facility];  Service: Cardiovascular;  Laterality: N/A;  . REPLACEMENT TOTAL KNEE Right   . SALPINGOOPHORECTOMY Bilateral 07/14/2020   Procedure: OPEN SALPINGO OOPHORECTOMY;  Surgeon: Gae Dry, MD;  Location: [facility] ORS;   Service: Gynecology;  Laterality: Bilateral;  . TOTAL SHOULDER REPLACEMENT Left     Social History   Socioeconomic History  . Marital status: Widowed    Spouse name: Not on file  . Number of children: 2  . Years of education: Not on file  . Highest education level: Not on file  Occupational History  . Occupation: retired   Tobacco Use  . Smoking status: Never Smoker  . Smokeless tobacco: Never Used  Vaping Use  . Vaping Use: Never used  Substance and Sexual Activity  . Alcohol use: No  . Drug use: No  . Sexual activity: Not on file  Other Topics Concern  . Not on file  Social History Narrative   Pt lives in 1 story home with her daughter, [name] and [name]'s husband   Has 2 adult daughters   Highest level of education: some college   Worked mainly as Web designer.   Social Determinants of Health   Financial Resource Strain: Not on file  Food Insecurity: Not on file  Transportation Needs: Not on file  Physical Activity: Not on file  Stress: Not on file  Social Connections: Not on file  Intimate Partner Violence: Not on file    Family History  Problem Relation Age of Onset  . Diabetes Daughter   . Hypertension Daughter   . Fibromyalgia Daughter   . GER disease Daughter   . Fibromyalgia Daughter   . Crohn's disease Daughter   . Asthma Daughter   . Hypertension Mother   . Breast cancer Other   . Breast cancer Niece   . Uterine cancer Niece      Current Outpatient Medications:  .  aspirin EC 325 MG tablet, Take 325 mg by mouth daily., Disp: , Rfl:  .  BOTOX 200 units SOLR, Inject 1 vial into the muscle every 3 (three) months., Disp: , Rfl:  .  Cranberry 1000 MG CAPS, Take 1 capsule by mouth 2 (two) times daily., Disp: , Rfl:  .  dexamethasone (DECADRON) 4 MG tablet, Take 2 tablets (8 mg total) by mouth daily. Start the day after carboplatin chemotherapy for 3 days., Disp: 30 tablet, Rfl: 1 .  donepezil (ARICEPT) 10 MG tablet, Take 1 tablet (10 mg total)  by mouth at bedtime., Disp: 90 tablet, Rfl: 3 .  dorzolamide-timolol (COSOPT) 22.3-6.8 MG/ML ophthalmic solution, Place 1 drop into both eyes 2 (two) times daily., Disp: , Rfl:  .  DULoxetine (CYMBALTA) 60 MG capsule, TAKE 1 CAPSULE EVERY DAY  FOR  DEPRESSION (Patient taking differently: Take 60 mg by mouth daily.), Disp: 90 capsule, Rfl: 1 .  fluticasone (FLONASE) 50 MCG/ACT nasal spray, Place 1 spray into both nostrils daily. (Patient taking differently: Place 1 spray into both nostrils daily as needed.), Disp: 48 g, Rfl: 0 .  gabapentin (NEURONTIN) 800 MG tablet, TAKE 1 TABLET BY MOUTH 3 TIMES A DAY FOR NEUROPATHY, Disp: 270 tablet, Rfl: 1 .  latanoprost (XALATAN) 0.005 % ophthalmic solution, Place 1 drop into both eyes at  bedtime., Disp: , Rfl:  .  levothyroxine (SYNTHROID) 112 MCG tablet, TAKE 1 TABLET EVERY MORNING ON AN EMPTY STOMACH WITH WATER ONLY.  NO FOOD OR OTHER MEDICATIONS FOR 30 MINUTES. (Patient taking differently: TAKE 1 TABLET EVERY MORNING ON AN EMPTY STOMACH WITH WATER ONLY.  NO FOOD OR OTHER MEDICATIONS FOR 30 MINUTES.), Disp: 90 tablet, Rfl: 1 .  lidocaine-prilocaine (EMLA) cream, Apply to affected area once, Disp: 30 g, Rfl: 3 .  meclizine (ANTIVERT) 25 MG tablet, TAKE 1 TABLET (25 MG TOTAL) BY MOUTH 2 (TWO) TIMES DAILY AS NEEDED FOR DIZZINESS., Disp: 180 tablet, Rfl: 0 .  meloxicam (MOBIC) 15 MG tablet, TAKE 1 TABLET DAILY AS NEEDED FOR PAIN., Disp: 90 tablet, Rfl: 0 .  Netarsudil Dimesylate (RHOPRESSA) 0.02 % SOLN, Place 1 drop into both eyes daily at 6 (six) AM., Disp: , Rfl:  .  omeprazole (PRILOSEC) 20 MG capsule, TAKE 1 CAPSULE BY MOUTH TWICE DAILY FOR HEARTBURN. (Patient taking differently: Take 20 mg by mouth 2 (two) times daily before a meal. TAKE 1 CAPSULE BY MOUTH TWICE DAILY FOR HEARTBURN.), Disp: 180 capsule, Rfl: 1 .  ondansetron (ZOFRAN) 8 MG tablet, Take 1 tablet (8 mg total) by mouth 2 (two) times daily as needed for refractory nausea / vomiting. Start on day 3  after carboplatin chemo., Disp: 30 tablet, Rfl: 1 .  oxyCODONE-acetaminophen (PERCOCET/ROXICET) 5-325 MG tablet, Take 1-2 tablets by mouth every 4 (four) hours as needed for moderate pain., Disp: 30 tablet, Rfl: 0 .  pilocarpine (PILOCAR) 2 % ophthalmic solution, Place 1 drop into both eyes 4 (four) times daily. , Disp: , Rfl:  .  potassium chloride SA (KLOR-CON) 20 MEQ tablet, Take 1 tablet (20 mEq total) by mouth daily., Disp: 14 tablet, Rfl: 0 .  propranolol (INDERAL) 80 MG tablet, TAKE 1 TABLET ONE TIME DAILY FOR BLOOD PRESSURE (Patient taking differently: Take 80 mg by mouth daily.), Disp: 90 tablet, Rfl: 1 .  rosuvastatin (CRESTOR) 10 MG tablet, TAKE 1 TABLET DAILY FOR CHOLESTEROL. (Patient taking differently: Take 10 mg by mouth every evening.), Disp: 90 tablet, Rfl: 1 .  traZODone (DESYREL) 100 MG tablet, Take two tablets at bedtime as needed for sleep (Patient taking differently: Take 200 mg by mouth at bedtime as needed for sleep. Take two tablets at bedtime as needed for sleep), Disp: 180 tablet, Rfl: 0 .  Accu-Chek FastClix Lancets MISC, USE AS INSTRUCTED TO TEST BLOOD SUGAR DAILY (Patient not taking: No sig reported), Disp: 102 each, Rfl: 0 .  ACCU-CHEK GUIDE test strip, USE AS INSTRUCTED TO BLOOD SUGAR DAILY (Patient not taking: No sig reported), Disp: 100 strip, Rfl: 0 .  blood glucose meter kit and supplies, Dispense based on patient and insurance preference. Use up to 2 times daily as directed. (FOR ICD-10 E10.9, E11.9). (Patient not taking: No sig reported), Disp: 1 each, Rfl: 0 .  LORazepam (ATIVAN) 0.5 MG tablet, Take 1 tablet (0.5 mg total) by mouth every 6 (six) hours as needed (Nausea or vomiting). (Patient not taking: Reported on 09/01/2020), Disp: 30 tablet, Rfl: 0 .  prochlorperazine (COMPAZINE) 10 MG tablet, Take 1 tablet (10 mg total) by mouth every 6 (six) hours as needed (Nausea or vomiting). (Patient not taking: No sig reported), Disp: 30 tablet, Rfl: 1 No current  facility-administered medications for this visit.  Facility-Administered Medications Ordered in Other Visits:  .  0.9 %  sodium chloride infusion, , Intravenous, Once, Sindy Guadeloupe, MD .  CARBOplatin (PARAPLATIN) 360 mg  in sodium chloride 0.9 % 250 mL chemo infusion, 360 mg, Intravenous, Once, Sindy Guadeloupe, MD, Last Rate: 572 mL/hr at 09/01/20 1235, 360 mg at 09/01/20 1235 .  dextrose 5 % solution, , Intravenous, Continuous, Sindy Guadeloupe, MD, Last Rate: 20 mL/hr at 09/01/20 0949, New Bag at 09/01/20 0949 .  heparin lock flush 100 unit/mL, 500 Units, Intracatheter, Once PRN, Sindy Guadeloupe, MD .  pegfilgrastim (NEULASTA ONPRO KIT) injection 6 mg, 6 mg, Subcutaneous, Once, Sindy Guadeloupe, MD .  sodium chloride flush (NS) 0.9 % injection 10 mL, 10 mL, Intracatheter, PRN, Sindy Guadeloupe, MD  Physical exam:  Vitals:   09/01/20 0908  BP: 121/70  Pulse: 63  Resp: 16  Temp: 98.2 F (36.8 C)  Weight: 153 lb 6.4 oz (69.6 kg)  Height: 5' 4"  (1.626 m)   Physical Exam Constitutional:      General: She is not in acute distress. Cardiovascular:     Rate and Rhythm: Normal rate and regular rhythm.     Heart sounds: Normal heart sounds.  Pulmonary:     Effort: Pulmonary effort is normal.     Breath sounds: Normal breath sounds.  Abdominal:     General: Bowel sounds are normal.     Palpations: Abdomen is soft.  Musculoskeletal:     Comments: Bruising noted over upper right back spanning an area of about 5 cm.  Skin:    General: Skin is warm and dry.  Neurological:     Mental Status: She is alert and oriented to person, place, and time.      CMP Latest Ref Rng & Units 09/01/2020  Glucose 70 - 99 mg/dL 177(H)  BUN 8 - 23 mg/dL 16  Creatinine 0.44 - 1.00 mg/dL 0.84  Sodium 135 - 145 mmol/L 137  Potassium 3.5 - 5.1 mmol/L 4.2  Chloride 98 - 111 mmol/L 100  CO2 22 - 32 mmol/L 26  Calcium 8.9 - 10.3 mg/dL 9.1  Total Protein 6.5 - 8.1 g/dL -  Total Bilirubin 0.3 - 1.2 mg/dL -   Alkaline Phos 38 - 126 U/L -  AST 15 - 41 U/L -  ALT 0 - 44 U/L -   CBC Latest Ref Rng & Units 09/01/2020  WBC 4.0 - 10.5 K/uL 11.1(H)  Hemoglobin 12.0 - 15.0 g/dL 10.6(L)  Hematocrit 36.0 - 46.0 % 34.3(L)  Platelets 150 - 400 K/uL 447(H)    No images are attached to the encounter.  NM Cardiac [place] [facility]  Result Date: 08/03/2020 CLINICAL DATA:  BILATERAL ovarian cancer, pre cardiotoxic chemotherapy EXAM: NUCLEAR MEDICINE CARDIAC BLOOD POOL IMAGING (MUGA) TECHNIQUE: Cardiac multi-gated acquisition was performed at rest following intravenous injection of Tc-59mlabeled red blood cells. RADIOPHARMACEUTICALS:  22.152 mCi [id number] in-vitro labeled red blood cells IV COMPARISON:  None FINDINGS: Calculated LEFT ventricular ejection fraction is 56%, normal. Study was obtained at a cardiac rate of 60 bpm. Patient was rhythmic during imaging. Cine analysis of the LEFT ventricle in 3 projections demonstrates normal LEFT ventricular wall motion. IMPRESSION: Normal LEFT ventricular ejection fraction of 56%. Normal LV wall motion. Electronically Signed   By: MaLavonia Dana.D.   On: 08/03/2020 16:06     Assessment and plan- Patient is a [age].o. female with high-grade serous carcinoma of the ovary FIGO stage I cpT1 cpNX cMX s/p bilateral salpingo-oophorectomy.  She is here for on treatment assessment prior to cycle 2 of adjuvant carbo Doxil chemotherapy  Counts okay to proceed with  cycle 2 of carbo Doxil chemotherapy today with on pro Neulasta support.  We will also give her 1 L of IV fluids today.  Return to clinic in 4 weeks and see covering MD for cycle 3 and possible IV fluids.  I will see her back in 8 weeks for cycle 4  Normocytic anemia: Likely secondary to chemotherapy.  Recent iron studies and folate were normal.  B12 levels were low.  She is on oral B12 for the same   Visit Diagnosis 1. Encounter for antineoplastic chemotherapy   2. Ovarian cancer, bilateral      Dr. Randa Evens,  MD, MPH [facility] at [facility] [phone] 09/01/2020 12:47 PM

## 2020-09-02 ENCOUNTER — Ambulatory Visit
Admission: RE | Admit: 2020-09-02 | Discharge: 2020-09-02 | Disposition: A | Discharge status: Home / Self Care | Payer: Medicare HMO | Source: Ambulatory Visit | Attending: Primary Care | Admitting: Primary Care

## 2020-09-02 ENCOUNTER — Other Ambulatory Visit: Payer: Self-pay

## 2020-09-02 DIAGNOSIS — Z1231 Encounter for screening mammogram for malignant neoplasm of breast: Secondary | ICD-10-CM | POA: Diagnosis not present

## 2020-09-02 HISTORY — DX: Personal history of antineoplastic chemotherapy: Z92.21

## 2020-09-07 ENCOUNTER — Other Ambulatory Visit: Payer: Self-pay | Admitting: Primary Care

## 2020-09-07 DIAGNOSIS — G8929 Other chronic pain: Secondary | ICD-10-CM

## 2020-09-07 DIAGNOSIS — M545 Low back pain, unspecified: Secondary | ICD-10-CM

## 2020-09-10 ENCOUNTER — Telehealth: Payer: Self-pay | Admitting: Neurology

## 2020-09-10 NOTE — Telephone Encounter (Signed)
Patient called in stating she had called [employer] to make sure we got her botox and they told her we needed to call [employer].

## 2020-09-10 NOTE — Telephone Encounter (Signed)
Telephone call to pt [place].   Botox shipped and delivered on Tuesday.  Pt appt will be scheduled for May 19 at 1:20 pm. If pt agrees.

## 2020-09-11 ENCOUNTER — Ambulatory Visit: Payer: Medicare HMO | Admitting: Neurology

## 2020-09-15 NOTE — Progress Notes (Signed)
UPDATE 09/15/2020: Was checking in on patient's results through [facility] and discovered her test had been cancelled. After following up about this, lab reported that they had not received a sample for her. I let them know when sample was sent and they found it and will begin processing testing today, should be back within a few weeks.

## 2020-09-23 ENCOUNTER — Telehealth: Payer: Self-pay | Admitting: Licensed Clinical Social Worker

## 2020-09-23 ENCOUNTER — Encounter: Payer: Self-pay | Admitting: Licensed Clinical Social Worker

## 2020-09-23 ENCOUNTER — Ambulatory Visit: Payer: Self-pay | Admitting: Licensed Clinical Social Worker

## 2020-09-23 DIAGNOSIS — Z853 Personal history of malignant neoplasm of breast: Secondary | ICD-10-CM

## 2020-09-23 DIAGNOSIS — Z1379 Encounter for other screening for genetic and chromosomal anomalies: Secondary | ICD-10-CM

## 2020-09-23 DIAGNOSIS — C563 Malignant neoplasm of bilateral ovaries: Secondary | ICD-10-CM

## 2020-09-23 DIAGNOSIS — Z803 Family history of malignant neoplasm of breast: Secondary | ICD-10-CM

## 2020-09-23 NOTE — Telephone Encounter (Signed)
Revealed negative genetic testing.  Revealed that a VUS in HOXB13 was identified.  We discussed that we do not know why she has had breast/ovarian cancer or why there is cancer in the family. It could be due to a different gene that we are not testing, or something our current technology cannot pick up.  It will be important for her to keep in contact with genetics to learn if additional testing may be needed in the future.

## 2020-09-23 NOTE — Progress Notes (Signed)
HPI:  Ms. Ciesla was previously seen in the [facility] clinic due to a personal and family history of cancer and concerns regarding a hereditary predisposition to cancer. Please refer to our prior cancer genetics clinic note for more information regarding our discussion, assessment and recommendations, at the time. Ms. Holohan recent genetic test results were disclosed to her, as were recommendations warranted by these results. These results and recommendations are discussed in more detail below.  CANCER HISTORY:  Oncology History  Ovarian cancer, bilateral  07/21/2020 Initial Diagnosis   Ovarian cancer, bilateral   07/21/2020 Cancer Staging   Staging form: Ovary, Fallopian Tube, and Primary Peritoneal Carcinoma, AJCC 8th Edition - Pathologic stage from 07/21/2020: FIGO Stage IC, calculated as Stage Unknown (pT1c, pNX, cM0) - Signed by Sindy Guadeloupe, MD on 07/24/2020 Gross residual tumor after primary cyto-reductive surgery: Absent   08/04/2020 -  Chemotherapy    Patient is on Treatment Plan: BREAST CARBOPLATIN (AUC 5) 21D       Genetic Testing   Negative genetic testing. No pathogenic variants identified on the [facility] panel. HRD has not been performed but can be if needed. VUS in HOXB13 called c.366C>A identified. The report date is 09/21/2020.  The [facility] gene panel offered by [employer] includes sequencing and deletion/duplication testing of the following 45 genes: APC, ATM, AXIN2, BAP1, BARD1, BMPR1A, BRCA1, BRCA2, BRIP1, CDH1, CDK4, CDKN2A, CHEK2, CTNNA1, FH, FLCN, HOXB13 (seq only), MEN1, MET, MLH1, MSH2, MSH3 (excluding repetitive portions of exon 1), MSH6, MUTYH, NTHL1, PALB2, PMS2, PTEN, RAD51C, RAD51D, SDHA, SDHB, SDHC, SDHD, SMAD4, STK11, TP53, TSC1, TSC2, VHL.     FAMILY HISTORY:  We obtained a detailed, 4-generation family history.  Significant diagnoses are listed below: Family History  Problem Relation Age of Onset  . Diabetes Daughter   .  Hypertension Daughter   . Fibromyalgia Daughter   . GER disease Daughter   . Fibromyalgia Daughter   . Crohn's disease Daughter   . Asthma Daughter   . Hypertension Mother   . Breast cancer Other   . Breast cancer Niece   . Uterine cancer Niece     Ms. Pease has 2 daughters, no cancers. She had 6 sisters and 2 brothers, none had cancer. She does have 2 nieces who had breast cancer through one of her sisters that had positive genetic testing, however reportedly this mutation is coming from the father's side of the family. A niece through one of her brothers also had breast cancer, and her sister had uterine cancer. A few great nieces have also had breast cancer.  Ms. Minney mother died at 14, no known cancers on this side of the family. Ms. Achorn father died at 64, no known cancers on this side of the family.   Ms. Gainey is unaware of previous family history of genetic testing for hereditary cancer risks. Patient's maternal ancestors are of unknown descent, and paternal ancestors are of unknown descent. There is no reported Ashkenazi Jewish ancestry. There is no known consanguinity.    GENETIC TEST RESULTS: Genetic testing reported out on 09/21/2020 through the [facility] cancer panel found no pathogenic mutations.   The [facility] gene panel offered by [employer] includes sequencing and deletion/duplication testing of the following 45 genes: APC, ATM, AXIN2, BAP1, BARD1, BMPR1A, BRCA1, BRCA2, BRIP1, CDH1, CDK4, CDKN2A, CHEK2, CTNNA1, FH, FLCN, HOXB13 (seq only), MEN1, MET, MLH1, MSH2, MSH3 (excluding repetitive portions of exon 1), MSH6, MUTYH, NTHL1, PALB2, PMS2, PTEN, RAD51C,  RAD51D, SDHA, SDHB, SDHC, SDHD, SMAD4, STK11, TP53, TSC1, TSC2, VHL.  The test report has been scanned into EPIC and is located under the Molecular Pathology section of the Results Review tab.  A portion of the result report is included below for reference.     We discussed with Ms. Moxey that  because current genetic testing is not perfect, it is possible there may be a gene mutation in one of these genes that current testing cannot detect, but that chance is small.  We also discussed, that there could be another gene that has not yet been discovered, or that we have not yet tested, that is responsible for the cancer diagnoses in the family. It is also possible there is a hereditary cause for the cancer in the family that Ms. Iyengar did not inherit and therefore was not identified in her testing.  Therefore, it is important to remain in touch with cancer genetics in the future so that we can continue to offer Ms. Birkel the most up to date genetic testing.   Genetic testing did identify a variant of uncertain significance (VUS) in the HOXB13 gene called c.366C>A.  At this time, it is unknown if this variant is associated with increased cancer risk or if this is a normal finding, but most variants such as this get reclassified to being inconsequential. It should not be used to make medical management decisions. With time, we suspect the lab will determine the significance of this variant, if any. If we do learn more about it, we will try to contact Ms. Hallowell to discuss it further. However, it is important to stay in touch with [place] periodically and keep the address and phone number up to date.  ADDITIONAL GENETIC TESTING: We discussed with Ms. Leblond that her genetic testing was fairly extensive.  If there are genes identified to increase cancer risk that can be analyzed in the future, we would be happy to discuss and coordinate this testing at that time.    CANCER SCREENING RECOMMENDATIONS: Ms. Mower test result is considered negative (normal).  This means that we have not identified a hereditary cause for her  personal and family history of cancer at this time. Most cancers happen by chance and this negative test suggests that her cancer may fall into this category.    While reassuring, this does not  definitively rule out a hereditary predisposition to cancer. It is still possible that there could be genetic mutations that are undetectable by current technology. There could be genetic mutations in genes that have not been tested or identified to increase cancer risk.  Therefore, it is recommended she continue to follow the cancer management and screening guidelines provided by her oncology and primary healthcare provider.   An individual's cancer risk and medical management are not determined by genetic test results alone. Overall cancer risk assessment incorporates additional factors, including personal medical history, family history, and any available genetic information that may result in a personalized plan for cancer prevention and surveillance.  RECOMMENDATIONS FOR FAMILY MEMBERS:  Relatives in this family might be at some increased risk of developing cancer, over the general population risk, simply due to the family history of cancer.  We recommended female relatives in this family have a yearly mammogram beginning at age 36, or 52 years younger than the earliest onset of cancer, an annual clinical breast exam, and perform monthly breast self-exams. Female relatives in this family should also have a gynecological exam as recommended by  their primary provider.  All family members should be referred for colonoscopy starting at age 73.   FOLLOW-UP: Lastly, we discussed with Ms. Polidori that cancer genetics is a rapidly advancing field and it is possible that new genetic tests will be appropriate for her and/or her family members in the future. We encouraged her to remain in contact with cancer genetics on an annual basis so we can update her personal and family histories and let her know of advances in cancer genetics that may benefit this family.   Our contact number was provided. Ms. Lastinger questions were answered to her satisfaction, and she knows she is welcome to call us at anytime with additional  questions or concerns.   Faith Rogue, MS, [facility] Genetic Counselor [email or web address] Phone: [phone]

## 2020-09-28 ENCOUNTER — Other Ambulatory Visit: Payer: Self-pay | Admitting: *Deleted

## 2020-09-28 DIAGNOSIS — C563 Malignant neoplasm of bilateral ovaries: Secondary | ICD-10-CM

## 2020-09-29 ENCOUNTER — Inpatient Hospital Stay: Payer: Medicare HMO

## 2020-09-29 ENCOUNTER — Encounter: Payer: Self-pay | Admitting: Internal Medicine

## 2020-09-29 ENCOUNTER — Inpatient Hospital Stay (HOSPITAL_BASED_OUTPATIENT_CLINIC_OR_DEPARTMENT_OTHER): Payer: Medicare HMO | Admitting: Internal Medicine

## 2020-09-29 DIAGNOSIS — C563 Malignant neoplasm of bilateral ovaries: Secondary | ICD-10-CM

## 2020-09-29 DIAGNOSIS — Z5189 Encounter for other specified aftercare: Secondary | ICD-10-CM | POA: Diagnosis not present

## 2020-09-29 DIAGNOSIS — Z5111 Encounter for antineoplastic chemotherapy: Secondary | ICD-10-CM | POA: Diagnosis not present

## 2020-09-29 LAB — CBC WITH DIFFERENTIAL/PLATELET
Abs Immature Granulocytes: 0.05 10*3/uL (ref 0.00–0.07)
Basophils Absolute: 0.1 10*3/uL (ref 0.0–0.1)
Basophils Relative: 1 %
Eosinophils Absolute: 0.2 10*3/uL (ref 0.0–0.5)
Eosinophils Relative: 2 %
HCT: 33.3 % — ABNORMAL LOW (ref 36.0–46.0)
Hemoglobin: 10.6 g/dL — ABNORMAL LOW (ref 12.0–15.0)
Immature Granulocytes: 1 %
Lymphocytes Relative: 22 %
Lymphs Abs: 2.1 10*3/uL (ref 0.7–4.0)
MCH: 29 pg (ref 26.0–34.0)
MCHC: 31.8 g/dL (ref 30.0–36.0)
MCV: 91.2 fL (ref 80.0–100.0)
Monocytes Absolute: 1.1 10*3/uL — ABNORMAL HIGH (ref 0.1–1.0)
Monocytes Relative: 12 %
Neutro Abs: 6 10*3/uL (ref 1.7–7.7)
Neutrophils Relative %: 62 %
Platelets: 393 10*3/uL (ref 150–400)
RBC: 3.65 MIL/uL — ABNORMAL LOW (ref 3.87–5.11)
RDW: 17.4 % — ABNORMAL HIGH (ref 11.5–15.5)
WBC: 9.4 10*3/uL (ref 4.0–10.5)
nRBC: 0 % (ref 0.0–0.2)

## 2020-09-29 LAB — COMPREHENSIVE METABOLIC PANEL
ALT: 11 U/L (ref 0–44)
AST: 21 U/L (ref 15–41)
Albumin: 3.8 g/dL (ref 3.5–5.0)
Alkaline Phosphatase: 77 U/L (ref 38–126)
Anion gap: 11 (ref 5–15)
BUN: 13 mg/dL (ref 8–23)
CO2: 26 mmol/L (ref 22–32)
Calcium: 8.5 mg/dL — ABNORMAL LOW (ref 8.9–10.3)
Chloride: 100 mmol/L (ref 98–111)
Creatinine, Ser: 0.9 mg/dL (ref 0.44–1.00)
GFR, Estimated: >60 mL/min (ref >60)
Glucose, Bld: 154 mg/dL — ABNORMAL HIGH (ref 70–99)
Potassium: 3.6 mmol/L (ref 3.5–5.1)
Sodium: 137 mmol/L (ref 135–145)
Total Bilirubin: 0.7 mg/dL (ref 0.3–1.2)
Total Protein: 6.8 g/dL (ref 6.5–8.1)

## 2020-09-29 MED ORDER — HEPARIN SOD (PORK) LOCK FLUSH 100 UNIT/ML IV SOLN
INTRAVENOUS | Status: AC
  Filled 2020-09-29: qty 5

## 2020-09-29 MED ORDER — DOXORUBICIN HCL LIPOSOMAL CHEMO INJECTION 2 MG/ML
28.0000 mg/m2 | Freq: Once | INTRAVENOUS | Status: AC
  Administered 2020-09-29: 50 mg via INTRAVENOUS
  Filled 2020-09-29: qty 25

## 2020-09-29 MED ORDER — PEGFILGRASTIM 6 MG/0.6ML ~~LOC~~ PSKT
6.0000 mg | PREFILLED_SYRINGE | Freq: Once | SUBCUTANEOUS | Status: AC
  Administered 2020-09-29: 6 mg via SUBCUTANEOUS
  Filled 2020-09-29: qty 0.6

## 2020-09-29 MED ORDER — PALONOSETRON HCL INJECTION 0.25 MG/5ML
0.2500 mg | Freq: Once | INTRAVENOUS | Status: AC
  Administered 2020-09-29: 0.25 mg via INTRAVENOUS
  Filled 2020-09-29: qty 5

## 2020-09-29 MED ORDER — SODIUM CHLORIDE 0.9 % IV SOLN
Freq: Once | INTRAVENOUS | Status: AC
  Filled 2020-09-29: qty 250

## 2020-09-29 MED ORDER — SODIUM CHLORIDE 0.9% FLUSH
10.0000 mL | Freq: Once | INTRAVENOUS | Status: AC
  Administered 2020-09-29: 10 mL via INTRAVENOUS
  Filled 2020-09-29: qty 10

## 2020-09-29 MED ORDER — HEPARIN SOD (PORK) LOCK FLUSH 100 UNIT/ML IV SOLN
500.0000 [IU] | Freq: Once | INTRAVENOUS | Status: AC | PRN
  Administered 2020-09-29: 500 [IU]
  Filled 2020-09-29: qty 5

## 2020-09-29 MED ORDER — SODIUM CHLORIDE 0.9 % IV SOLN
150.0000 mg | Freq: Once | INTRAVENOUS | Status: AC
  Administered 2020-09-29: 150 mg via INTRAVENOUS
  Filled 2020-09-29: qty 150

## 2020-09-29 MED ORDER — SODIUM CHLORIDE 0.9 % IV SOLN: 358.5000 mg | Freq: Once | INTRAVENOUS | Status: DC

## 2020-09-29 MED ORDER — SODIUM CHLORIDE 0.9 % IV SOLN
10.0000 mg | Freq: Once | INTRAVENOUS | Status: AC
  Administered 2020-09-29: 10 mg via INTRAVENOUS
  Filled 2020-09-29: qty 10

## 2020-09-29 MED ORDER — SODIUM CHLORIDE 0.9 % IV SOLN
358.5000 mg | Freq: Once | INTRAVENOUS | Status: AC
Start: 2020-09-29 — End: 2020-09-29
  Administered 2020-09-29: 360 mg via INTRAVENOUS
  Filled 2020-09-29: qty 36

## 2020-09-29 NOTE — Patient Instructions (Addendum)
[facility]    Discharge Instructions: Thank you for choosing [facility] to provide your oncology and hematology care.  If you have a lab appointment with the [facility], please go directly to the [facility] and check in at the registration area.  Wear comfortable clothing and clothing appropriate for easy access to any Portacath or PICC line.   We strive to give you quality time with your provider. You may need to reschedule your appointment if you arrive late (15 or more minutes).  Arriving late affects you and other patients whose appointments are after yours.  Also, if you miss three or more appointments without notifying the office, you may be dismissed from the clinic at the provider's discretion.      For prescription refill requests, have your pharmacy contact our office and allow 72 hours for refills to be completed.    Today you received the following chemotherapy and/or immunotherapy agents - Doxil/Carboplatin      To help prevent nausea and vomiting after your treatment, we encourage you to take your nausea medication as directed.  BELOW ARE SYMPTOMS THAT SHOULD BE REPORTED IMMEDIATELY: . *FEVER GREATER THAN 100.4 F (38 C) OR HIGHER . *CHILLS OR SWEATING . *NAUSEA AND VOMITING THAT IS NOT CONTROLLED WITH YOUR NAUSEA MEDICATION . *UNUSUAL SHORTNESS OF BREATH . *UNUSUAL BRUISING OR BLEEDING . *URINARY PROBLEMS (pain or burning when urinating, or frequent urination) . *BOWEL PROBLEMS (unusual diarrhea, constipation, pain near the anus) . TENDERNESS IN MOUTH AND THROAT WITH OR WITHOUT PRESENCE OF ULCERS (sore throat, sores in mouth, or a toothache) . UNUSUAL RASH, SWELLING OR PAIN  . UNUSUAL VAGINAL DISCHARGE OR ITCHING   Items with * indicate a potential emergency and should be followed up as soon as possible or go to the Emergency Department if any problems should occur.  Please show the CHEMOTHERAPY ALERT CARD or  IMMUNOTHERAPY ALERT CARD at check-in to the Emergency Department and triage nurse.  Should you have questions after your visit or need to cancel or reschedule your appointment, please contact [facility]  [phone] and follow the prompts.  Office hours are 8:00 a.m. to 4:30 p.m. Monday - Friday. Please note that voicemails left after 4:00 p.m. may not be returned until the following business day.  We are closed weekends and major holidays. You have access to a nurse at all times for urgent questions. Please call the main number to the clinic [phone] and follow the prompts.  For any non-urgent questions, you may also contact your provider using MyChart. We now offer e-Visits for anyone 78 and older to request care online for non-urgent symptoms. For details visit [email or web address].   Also download the MyChart app! Go to the app store, search "MyChart", open the app, select [facility], and log in with your MyChart username and password.  Due to Covid, a mask is required upon entering the hospital/clinic. If you do not have a mask, one will be given to you upon arrival. For doctor visits, patients may have 1 support person aged 70 or older with them. For treatment visits, patients cannot have anyone with them due to current Covid guidelines and our immunocompromised population.   Dexamethasone injection What is this medicine? DEXAMETHASONE (dex a METH a sone) is a corticosteroid. It is used to treat inflammation of the skin, joints, lungs, and other organs. Common conditions treated include asthma, allergies, and arthritis. It is also used for  other conditions, like blood disorders and diseases of the adrenal glands. This medicine may be used for other purposes; ask your health care provider or pharmacist if you have questions. COMMON BRAND NAME(S): Decadron, DoubleDex, ReadySharp Dexamethasone, Simplist Dexamethasone, Solurex What should I tell my  health care provider before I take this medicine? They need to know if you have any of these conditions:  Cushing's syndrome  diabetes  glaucoma  heart disease  high blood pressure  infection like herpes, measles, tuberculosis, or chickenpox  kidney disease  liver disease  mental illness  myasthenia gravis  osteoporosis  previous heart attack  seizures  stomach or intestine problems  thyroid disease  an unusual or allergic reaction to dexamethasone, corticosteroids, other medicines, lactose, foods, dyes, or preservatives  pregnant or trying to get pregnant  breast-feeding How should I use this medicine? This medicine is for injection into a muscle, joint, lesion, soft tissue, or vein. It is given by a health care professional in a hospital or clinic setting. Talk to your pediatrician regarding the use of this medicine in children. Special care may be needed. Overdosage: If you think you have taken too much of this medicine contact a poison control center or emergency room at once. NOTE: This medicine is only for you. Do not share this medicine with others. What if I miss a dose? This may not apply. If you are having a series of injections over a prolonged period, try not to miss an appointment. Call your doctor or health care professional to reschedule if you are unable to keep an appointment. What may interact with this medicine? Do not take this medicine with any of the following medications:  live virus vaccines This medicine may also interact with the following medications:  aminoglutethimide  amphotericin B  aspirin and aspirin-like medicines  certain antibiotics like erythromycin, clarithromycin, and troleandomycin  certain antivirals for HIV or hepatitis  certain medicines for seizures like carbamazepine, phenobarbital, phenytoin  certain medicines to treat myasthenia  gravis  cholestyramine  cyclosporine  digoxin  diuretics  ephedrine  female hormones, like estrogen or progestins and birth control pills  insulin or other medicines for diabetes  isoniazid  ketoconazole  medicines that relax muscles for surgery  mifepristone  NSAIDs, medicines for pain and inflammation, like ibuprofen or naproxen  rifampin  skin tests for allergies  thalidomide  vaccines  warfarin This list may not describe all possible interactions. Give your health care provider a list of all the medicines, herbs, non-prescription drugs, or dietary supplements you use. Also tell them if you smoke, drink alcohol, or use illegal drugs. Some items may interact with your medicine. What should I watch for while using this medicine? Visit your health care professional for regular checks on your progress. Tell your health care professional if your symptoms do not start to get better or if they get worse. Your condition will be monitored carefully while you are receiving this medicine. Wear a medical ID bracelet or chain. Carry a card that describes your disease and details of your medicine and dosage times. This medicine may increase your risk of getting an infection. Call your health care professional for advice if you get a fever, chills, or sore throat, or other symptoms of a cold or flu. Do not treat yourself. Try to avoid being around people who are sick. Call your health care professional if you are around anyone with measles, chickenpox, or if you develop sores or blisters that do not heal properly.  If you are going to need surgery or other procedures, tell your doctor or health care professional that you have taken this medicine within the last 12 months. Ask your doctor or health care professional about your diet. You may need to lower the amount of salt you eat. This medicine may increase blood sugar. Ask your healthcare provider if changes in diet or medicines are  needed if you have diabetes. What side effects may I notice from receiving this medicine? Side effects that you should report to your doctor or health care professional as soon as possible:  allergic reactions like skin rash, itching or hives, swelling of the face, lips, or tongue  bloody or black, tarry stools  changes in emotions or moods  changes in vision  confusion, excitement, restlessness  depressed mood  eye pain  hallucinations  muscle weakness  severe or sudden stomach or belly pain  signs and symptoms of high blood sugar such as being more thirsty or hungry or having to urinate more than normal. You may also feel very tired or have blurry vision.  signs and symptoms of infection like fever; chills; cough; sore throat; pain or trouble passing urine  swelling of ankles, feet  unusual bruising or bleeding  wounds that do not heal Side effects that usually do not require medical attention (report to your doctor or health care professional if they continue or are bothersome):  increased appetite  increased growth of face or body hair  headache  nausea, vomiting  pain, redness, or irritation at site where injected  skin problems, acne, thin and shiny skin  trouble sleeping  weight gain This list may not describe all possible side effects. Call your doctor for medical advice about side effects. You may report side effects to FDA at 1-800-FDA-1088. Where should I keep my medicine? This medicine is given in a hospital or clinic and will not be stored at home. NOTE: This sheet is a summary. It may not cover all possible information. If you have questions about this medicine, talk to your doctor, pharmacist, or health care provider.  2021 Elsevier/Gold Standard (2018-10-30 13:51:58)  Palonosetron Injection What is this medicine? PALONOSETRON (pal oh NOE se tron) is used to prevent nausea and vomiting caused by chemotherapy. It also helps prevent delayed  nausea and vomiting that may occur a few days after your treatment. This medicine may be used for other purposes; ask your health care provider or pharmacist if you have questions. COMMON BRAND NAME(S): Aloxi What should I tell my health care provider before I take this medicine? They need to know if you have any of these conditions:  an unusual or allergic reaction to palonosetron, dolasetron, granisetron, ondansetron, other medicines, foods, dyes, or preservatives  pregnant or trying to get pregnant  breast-feeding How should I use this medicine? This medicine is for infusion into a vein. It is given by a health care professional in a hospital or clinic setting. Talk to your pediatrician regarding the use of this medicine in children. While this drug may be prescribed for children as young as 1 month for selected conditions, precautions do apply. Overdosage: If you think you have taken too much of this medicine contact a poison control center or emergency room at once. NOTE: This medicine is only for you. Do not share this medicine with others. What if I miss a dose? This does not apply. What may interact with this medicine?  certain medicines for depression, anxiety, or psychotic disturbances  fentanyl  linezolid  MAOIs like Carbex, Eldepryl, Marplan, Nardil, and Parnate  methylene blue (injected into a vein)  tramadol This list may not describe all possible interactions. Give your health care provider a list of all the medicines, herbs, non-prescription drugs, or dietary supplements you use. Also tell them if you smoke, drink alcohol, or use illegal drugs. Some items may interact with your medicine. What should I watch for while using this medicine? Your condition will be monitored carefully while you are receiving this medicine. What side effects may I notice from receiving this medicine? Side effects that you should report to your doctor or health care professional as soon as  possible:  allergic reactions like skin rash, itching or hives, swelling of the face, lips, or tongue  breathing problems  confusion  dizziness  fast, irregular heartbeat  fever and chills  loss of balance or coordination  seizures  sweating  swelling of the hands and feet  tremors  unusually weak or tired Side effects that usually do not require medical attention (report to your doctor or health care professional if they continue or are bothersome):  constipation or diarrhea  headache This list may not describe all possible side effects. Call your doctor for medical advice about side effects. You may report side effects to FDA at 1-800-FDA-1088. Where should I keep my medicine? This drug is given in a hospital or clinic and will not be stored at home. NOTE: This sheet is a summary. It may not cover all possible information. If you have questions about this medicine, talk to your doctor, pharmacist, or health care provider.  2021 Elsevier/Gold Standard (2013-02-22 10:38:36)  Fosaprepitant injection What is this medicine? FOSAPREPITANT (fos ap RE pi tant) is used together with other medicines to prevent nausea and vomiting caused by cancer treatment (chemotherapy). This medicine may be used for other purposes; ask your health care provider or pharmacist if you have questions. COMMON BRAND NAME(S): Emend What should I tell my health care provider before I take this medicine? They need to know if you have any of these conditions:  liver disease  an unusual or allergic reaction to fosaprepitant, aprepitant, medicines, foods, dyes, or preservatives  pregnant or trying to get pregnant  breast-feeding How should I use this medicine? This medicine is for injection into a vein. It is given by a health care professional in a hospital or clinic setting. Talk to your pediatrician regarding the use of this medicine in children. While this drug may be prescribed for children  as young as 6 months for selected conditions, precautions do apply. Overdosage: If you think you have taken too much of this medicine contact a poison control center or emergency room at once. NOTE: This medicine is only for you. Do not share this medicine with others. What if I miss a dose? This does not apply. What may interact with this medicine? Do not take this medicine with any of these medicines:  cisapride  flibanserin  lomitapide  pimozide This medicine may also interact with the following medications:  diltiazem  female hormones, like estrogens or progestins and birth control pills  medicines for fungal infections like ketoconazole and itraconazole  medicines for HIV  medicines for seizures or to control epilepsy like carbamazepine or phenytoin  medicines used for sleep or anxiety disorders like alprazolam, diazepam, or midazolam  nefazodone  paroxetine  ranolazine  rifampin  some chemotherapy medications like etoposide, ifosfamide, vinblastine, vincristine  some antibiotics like clarithromycin, erythromycin, troleandomycin  steroid medicines like dexamethasone or methylprednisolone  tolbutamide  warfarin This list may not describe all possible interactions. Give your health care provider a list of all the medicines, herbs, non-prescription drugs, or dietary supplements you use. Also tell them if you smoke, drink alcohol, or use illegal drugs. Some items may interact with your medicine. What should I watch for while using this medicine? Do not take this medicine if you already have nausea and vomiting. Ask your health care provider what to do if you already have nausea. Birth control pills and other methods of hormonal contraception (for example, IUD or patch) may not work properly while you are taking this medicine. Use an extra method of birth control during treatment and for 1 month after your last dose of fosaprepitant. This medicine should not be used  continuously for a long time. Visit your doctor or health care professional for regular check-ups. This medicine may change your liver function blood test results. What side effects may I notice from receiving this medicine? Side effects that you should report to your doctor or health care professional as soon as possible:  allergic reactions like skin rash, itching or hives, swelling of the face, lips, or tongue  breathing problems  changes in heart rhythm  high or low blood pressure  pain, redness, or irritation at site where injected  rectal bleeding  serious dizziness or disorientation, confusion  sharp or severe stomach pain  sharp pain in your leg Side effects that usually do not require medical attention (report to your doctor or health care professional if they continue or are bothersome):  constipation or diarrhea  hair loss  headache  hiccups  loss of appetite  nausea  upset stomach  tiredness This list may not describe all possible side effects. Call your doctor for medical advice about side effects. You may report side effects to FDA at 1-800-FDA-1088. Where should I keep my medicine? This drug is given in a hospital or clinic and will not be stored at home. NOTE: This sheet is a summary. It may not cover all possible information. If you have questions about this medicine, talk to your doctor, pharmacist, or health care provider.  2021 Elsevier/Gold Standard (2016-08-04 12:55:48)  Doxorubicin Liposomal injection What is this medicine? LIPOSOMAL DOXORUBICIN (LIP oh som al dox oh ROO bi sin) is a chemotherapy drug. This medicine is used to treat many kinds of cancer like Kaposi's sarcoma, multiple myeloma, and ovarian cancer. This medicine may be used for other purposes; ask your health care provider or pharmacist if you have questions. COMMON BRAND NAME(S): Doxil, Lipodox What should I tell my health care provider before I take this medicine? They need  to know if you have any of these conditions:  blood disorders  heart disease  infection (especially a virus infection such as chickenpox, cold sores, or herpes)  liver disease  recent or ongoing radiation therapy  an unusual or allergic reaction to doxorubicin, other chemotherapy agents, soybeans, other medicines, foods, dyes, or preservatives  pregnant or trying to get pregnant  breast-feeding How should I use this medicine? This drug is given as an infusion into a vein. It is administered in a hospital or clinic by a specially trained health care professional. If you have pain, swelling, burning or any unusual feeling around the site of your injection, tell your health care professional right away. Talk to your pediatrician regarding the use of this medicine in children. Special care may be needed. Overdosage: If you  think you have taken too much of this medicine contact a poison control center or emergency room at once. NOTE: This medicine is only for you. Do not share this medicine with others. What if I miss a dose? It is important not to miss your dose. Call your doctor or health care professional if you are unable to keep an appointment. What may interact with this medicine? Do not take this medicine with any of the following medications:  zidovudine This medicine may also interact with the following medications:  medicines to increase blood counts like filgrastim, pegfilgrastim, sargramostim  vaccines Talk to your doctor or health care professional before taking any of these medicines:  acetaminophen  aspirin  ibuprofen  ketoprofen  naproxen This list may not describe all possible interactions. Give your health care provider a list of all the medicines, herbs, non-prescription drugs, or dietary supplements you use. Also tell them if you smoke, drink alcohol, or use illegal drugs. Some items may interact with your medicine. What should I watch for while using this  medicine? Your condition will be monitored carefully while you are receiving this medicine. You may need blood work done while you are taking this medicine. This drug may make you feel generally unwell. This is not uncommon, as chemotherapy can affect healthy cells as well as cancer cells. Report any side effects. Continue your course of treatment even though you feel ill unless your doctor tells you to stop. Your urine may turn orange-red for a few days after your dose. This is not blood. If your urine is dark or brown, call your doctor. In some cases, you may be given additional medicines to help with side effects. Follow all directions for their use. Talk to your doctor about your risk of cancer. You may be more at risk for certain types of cancers if you take this medicine. Do not become pregnant while taking this medicine or for 6 months after stopping it. Women should inform their healthcare professional if they wish to become pregnant or think they may be pregnant. Men should not father a child while taking this medicine and for 6 months after stopping it. There is a potential for serious side effects to an unborn child. Talk to your health care professional or pharmacist for more information. Do not breast-feed an infant while taking this medicine. This medicine has caused ovarian failure in some women. This medicine may make it more difficult to get pregnant. Talk to your healthcare professional if you are concerned about your fertility. This medicine has caused decreased sperm counts in some men. This may make it more difficult to father a child. Talk to your healthcare professional if you are concerned about your fertility. This medicine may cause a decrease in Co-Enzyme Q-10. You should make sure that you get enough Co-Enzyme Q-10 while you are taking this medicine. Discuss the foods you eat and the vitamins you take with your health care professional. What side effects may I notice from  receiving this medicine? Side effects that you should report to your doctor or health care professional as soon as possible:  allergic reactions like skin rash, itching or hives, swelling of the face, lips, or tongue  low blood counts - this medicine may decrease the number of Christopher Hink blood cells, red blood cells and platelets. You may be at increased risk for infections and bleeding.  signs of hand-foot syndrome - tingling or burning, redness, flaking, swelling, small blisters, or small sores on the palms  of your hands or the soles of your feet  signs of infection - fever or chills, cough, sore throat, pain or difficulty passing urine  signs of decreased platelets or bleeding - bruising, pinpoint red spots on the skin, black, tarry stools, blood in the urine  signs of decreased red blood cells - unusually weak or tired, fainting spells, lightheadedness  back pain, chills, facial flushing, fever, headache, tightness in the chest or throat during the infusion  breathing problems  chest pain  fast, irregular heartbeat  mouth pain, redness, sores  pain, swelling, redness at site where injected  pain, tingling, numbness in the hands or feet  swelling of ankles, feet, or hands  vomiting Side effects that usually do not require medical attention (report to your doctor or health care professional if they continue or are bothersome):  diarrhea  hair loss  loss of appetite  nail discoloration or damage  nausea  red or watery eyes  red colored urine  stomach upset This list may not describe all possible side effects. Call your doctor for medical advice about side effects. You may report side effects to FDA at 1-800-FDA-1088. Where should I keep my medicine? This drug is given in a hospital or clinic and will not be stored at home. NOTE: This sheet is a summary. It may not cover all possible information. If you have questions about this medicine, talk to your doctor,  pharmacist, or health care provider.  2021 Elsevier/Gold Standard (2017-12-25 15:13:26)  Carboplatin injection What is this medicine? CARBOPLATIN (KAR boe pla tin) is a chemotherapy drug. It targets fast dividing cells, like cancer cells, and causes these cells to die. This medicine is used to treat ovarian cancer and many other cancers. This medicine may be used for other purposes; ask your health care provider or pharmacist if you have questions. COMMON BRAND NAME(S): Paraplatin What should I tell my health care provider before I take this medicine? They need to know if you have any of these conditions:  blood disorders  hearing problems  kidney disease  recent or ongoing radiation therapy  an unusual or allergic reaction to carboplatin, cisplatin, other chemotherapy, other medicines, foods, dyes, or preservatives  pregnant or trying to get pregnant  breast-feeding How should I use this medicine? This drug is usually given as an infusion into a vein. It is administered in a hospital or clinic by a specially trained health care professional. Talk to your pediatrician regarding the use of this medicine in children. Special care may be needed. Overdosage: If you think you have taken too much of this medicine contact a poison control center or emergency room at once. NOTE: This medicine is only for you. Do not share this medicine with others. What if I miss a dose? It is important not to miss a dose. Call your doctor or health care professional if you are unable to keep an appointment. What may interact with this medicine?  medicines for seizures  medicines to increase blood counts like filgrastim, pegfilgrastim, sargramostim  some antibiotics like amikacin, gentamicin, neomycin, streptomycin, tobramycin  vaccines Talk to your doctor or health care professional before taking any of these medicines:  acetaminophen  aspirin  ibuprofen  ketoprofen  naproxen This list  may not describe all possible interactions. Give your health care provider a list of all the medicines, herbs, non-prescription drugs, or dietary supplements you use. Also tell them if you smoke, drink alcohol, or use illegal drugs. Some items may interact with  your medicine. What should I watch for while using this medicine? Your condition will be monitored carefully while you are receiving this medicine. You will need important blood work done while you are taking this medicine. This drug may make you feel generally unwell. This is not uncommon, as chemotherapy can affect healthy cells as well as cancer cells. Report any side effects. Continue your course of treatment even though you feel ill unless your doctor tells you to stop. In some cases, you may be given additional medicines to help with side effects. Follow all directions for their use. Call your doctor or health care professional for advice if you get a fever, chills or sore throat, or other symptoms of a cold or flu. Do not treat yourself. This drug decreases your body's ability to fight infections. Try to avoid being around people who are sick. This medicine may increase your risk to bruise or bleed. Call your doctor or health care professional if you notice any unusual bleeding. Be careful brushing and flossing your teeth or using a toothpick because you may get an infection or bleed more easily. If you have any dental work done, tell your dentist you are receiving this medicine. Avoid taking products that contain aspirin, acetaminophen, ibuprofen, naproxen, or ketoprofen unless instructed by your doctor. These medicines may hide a fever. Do not become pregnant while taking this medicine. Women should inform their doctor if they wish to become pregnant or think they might be pregnant. There is a potential for serious side effects to an unborn child. Talk to your health care professional or pharmacist for more information. Do not breast-feed an  infant while taking this medicine. What side effects may I notice from receiving this medicine? Side effects that you should report to your doctor or health care professional as soon as possible:  allergic reactions like skin rash, itching or hives, swelling of the face, lips, or tongue  signs of infection - fever or chills, cough, sore throat, pain or difficulty passing urine  signs of decreased platelets or bleeding - bruising, pinpoint red spots on the skin, black, tarry stools, nosebleeds  signs of decreased red blood cells - unusually weak or tired, fainting spells, lightheadedness  breathing problems  changes in hearing  changes in vision  chest pain  high blood pressure  low blood counts - This drug may decrease the number of Malanie Koloski blood cells, red blood cells and platelets. You may be at increased risk for infections and bleeding.  nausea and vomiting  pain, swelling, redness or irritation at the injection site  pain, tingling, numbness in the hands or feet  problems with balance, talking, walking  trouble passing urine or change in the amount of urine Side effects that usually do not require medical attention (report to your doctor or health care professional if they continue or are bothersome):  hair loss  loss of appetite  metallic taste in the mouth or changes in taste This list may not describe all possible side effects. Call your doctor for medical advice about side effects. You may report side effects to FDA at 1-800-FDA-1088. Where should I keep my medicine? This drug is given in a hospital or clinic and will not be stored at home. NOTE: This sheet is a summary. It may not cover all possible information. If you have questions about this medicine, talk to your doctor, pharmacist, or health care provider.  2021 Elsevier/Gold Standard (2007-07-24 14:38:05)  Pegfilgrastim injection What is this medicine? PEGFILGRASTIM (  PEG fil gra stim) is a long-acting  granulocyte colony-stimulating factor that stimulates the growth of neutrophils, a type of Delayza Lungren blood cell important in the body's fight against infection. It is used to reduce the incidence of fever and infection in patients with certain types of cancer who are receiving chemotherapy that affects the bone marrow, and to increase survival after being exposed to high doses of radiation. This medicine may be used for other purposes; ask your health care provider or pharmacist if you have questions. COMMON BRAND NAME(S): Rexene Edison, Ziextenzo What should I tell my health care provider before I take this medicine? They need to know if you have any of these conditions:  kidney disease  latex allergy  ongoing radiation therapy  sickle cell disease  skin reactions to acrylic adhesives (On-Body Injector only)  an unusual or allergic reaction to pegfilgrastim, filgrastim, other medicines, foods, dyes, or preservatives  pregnant or trying to get pregnant  breast-feeding How should I use this medicine? This medicine is for injection under the skin. If you get this medicine at home, you will be taught how to prepare and give the pre-filled syringe or how to use the On-body Injector. Refer to the patient Instructions for Use for detailed instructions. Use exactly as directed. Tell your healthcare provider immediately if you suspect that the On-body Injector may not have performed as intended or if you suspect the use of the On-body Injector resulted in a missed or partial dose. It is important that you put your used needles and syringes in a special sharps container. Do not put them in a trash can. If you do not have a sharps container, call your pharmacist or healthcare provider to get one. Talk to your pediatrician regarding the use of this medicine in children. While this drug may be prescribed for selected conditions, precautions do apply. Overdosage: If you think you have  taken too much of this medicine contact a poison control center or emergency room at once. NOTE: This medicine is only for you. Do not share this medicine with others. What if I miss a dose? It is important not to miss your dose. Call your doctor or health care professional if you miss your dose. If you miss a dose due to an On-body Injector failure or leakage, a new dose should be administered as soon as possible using a single prefilled syringe for manual use. What may interact with this medicine? Interactions have not been studied. This list may not describe all possible interactions. Give your health care provider a list of all the medicines, herbs, non-prescription drugs, or dietary supplements you use. Also tell them if you smoke, drink alcohol, or use illegal drugs. Some items may interact with your medicine. What should I watch for while using this medicine? Your condition will be monitored carefully while you are receiving this medicine. You may need blood work done while you are taking this medicine. Talk to your health care provider about your risk of cancer. You may be more at risk for certain types of cancer if you take this medicine. If you are going to need a MRI, CT scan, or other procedure, tell your doctor that you are using this medicine (On-Body Injector only). What side effects may I notice from receiving this medicine? Side effects that you should report to your doctor or health care professional as soon as possible:  allergic reactions (skin rash, itching or hives, swelling of the face, lips, or tongue)  back pain  dizziness  fever  pain, redness, or irritation at site where injected  pinpoint red spots on the skin  red or dark-brown urine  shortness of breath or breathing problems  stomach or side pain, or pain at the shoulder  swelling  tiredness  trouble passing urine or change in the amount of urine  unusual bruising or bleeding Side effects that  usually do not require medical attention (report to your doctor or health care professional if they continue or are bothersome):  bone pain  muscle pain This list may not describe all possible side effects. Call your doctor for medical advice about side effects. You may report side effects to FDA at 1-800-FDA-1088. Where should I keep my medicine? Keep out of the reach of children. If you are using this medicine at home, you will be instructed on how to store it. Throw away any unused medicine after the expiration date on the label. NOTE: This sheet is a summary. It may not cover all possible information. If you have questions about this medicine, talk to your doctor, pharmacist, or health care provider.  2021 Elsevier/Gold Standard (2019-05-10 13:20:51)

## 2020-09-29 NOTE — Progress Notes (Signed)
[place] NOTE  Patient Care Team: Pleas Koch, NP as PCP - General (Internal Medicine) Cameron Sprang, MD as Consulting Physician (Neurology) Clent Jacks, RN as Oncology Nurse Navigator  CHIEF COMPLAINTS/PURPOSE OF CONSULTATION: Ovarian cancer  #  Oncology History  Ovarian cancer, bilateral  07/21/2020 Initial Diagnosis   Ovarian cancer, bilateral   07/21/2020 Cancer Staging   Staging form: Ovary, Fallopian Tube, and Primary Peritoneal Carcinoma, AJCC 8th Edition - Pathologic stage from 07/21/2020: FIGO Stage IC, calculated as Stage Unknown (pT1c, pNX, cM0) - Signed by Sindy Guadeloupe, MD on 07/24/2020 Gross residual tumor after primary cyto-reductive surgery: Absent   08/04/2020 -  Chemotherapy    Patient is on Treatment Plan: BREAST CARBOPLATIN (AUC 5) 21D       Genetic Testing   Negative genetic testing. No pathogenic variants identified on the [facility] panel. HRD has not been performed but can be if needed. VUS in HOXB13 called [id number] identified. The report date is 09/21/2020.  The [facility] gene panel offered by [employer] includes sequencing and deletion/duplication testing of the following 45 genes: APC, ATM, AXIN2, BAP1, BARD1, BMPR1A, BRCA1, BRCA2, BRIP1, CDH1, CDK4, CDKN2A, CHEK2, CTNNA1, FH, FLCN, HOXB13 (seq only), MEN1, MET, MLH1, MSH2, MSH3 (excluding repetitive portions of exon 1), MSH6, MUTYH, NTHL1, PALB2, PMS2, PTEN, RAD51C, RAD51D, SDHA, SDHB, SDHC, SDHD, SMAD4, STK11, TP53, TSC1, TSC2, VHL.      HISTORY OF PRESENTING ILLNESS:  Shannon Higgins 86 y.o.  female history of stage I high-grade serous cancer currently on adjuvant chemotherapy with [place] Doxil [sec to PN]is here for follow-up.   Patient denies any nausea vomiting abdominal pain.  Complains of fatigue.  Denies any rash on palms and soles.  No diarrhea.  No fevers or chills.  Review of Systems  Constitutional: Positive for malaise/fatigue. Negative  for chills, diaphoresis, fever and weight loss.  HENT: Negative for nosebleeds and sore throat.   Eyes: Negative for double vision.  Respiratory: Negative for cough, hemoptysis, sputum production, shortness of breath and wheezing.   Cardiovascular: Negative for chest pain, palpitations, orthopnea and leg swelling.  Gastrointestinal: Negative for abdominal pain, blood in stool, constipation, diarrhea, heartburn, melena, nausea and vomiting.  Genitourinary: Negative for dysuria, frequency and urgency.  Musculoskeletal: Positive for back pain. Negative for joint pain.  Skin: Negative.  Negative for itching and rash.  Neurological: Positive for tingling. Negative for dizziness, focal weakness, weakness and headaches.  Endo/Heme/Allergies: Does not bruise/bleed easily.  Psychiatric/Behavioral: Negative for depression. The patient is not nervous/anxious and does not have insomnia.      MEDICAL HISTORY:  Past Medical History:  Diagnosis Date  . Acute tubular injury of transplanted kidney ([place]) 06/14/2016  . AKI (acute kidney injury) ([place]) 06/14/2016  . Asthma   . Breast cancer ([place]) 2015   left  . Bronchitis   . CAP (community acquired pneumonia) 04/29/2012  . Chronic sinusitis   . Confusion 12/06/2016  . Diabetes mellitus without complication ([place])   . Family history of breast cancer   . GERD (gastroesophageal reflux disease)   . History of ankle fracture 05/14/2018   Last Assessment & Plan:  With a recent fall. Right medial malleous. Has ortho follow up soon, splinted now and tylenol helps her pain  . History of recurrent UTIs   . Hypertension   . Hypokalemia 04/26/2012  . Hyponatremia   . Hypothyroidism   . Insomnia   . Migraine   . Neuropathic pain   .  Ovarian cancer ([place]) 2022  . Personal history of breast cancer   . Personal history of chemotherapy current   bilateral ovarian ca  . Pneumonia   . Rheumatoid arthritis ([place])   . Sepsis ([place]) 04/26/2012  . Sleep apnea   . Stroke  [facility])    seen in CT scan  . Thyroid disease     SURGICAL HISTORY: Past Surgical History:  Procedure Laterality Date  . ABDOMINAL HYSTERECTOMY    . BACK SURGERY    . BREAST BIOPSY Right ?`   benign  . BREAST LUMPECTOMY Left 2015   breast ca  . CHOLECYSTECTOMY    . JOINT REPLACEMENT    . LAPAROTOMY N/A 07/14/2020   Procedure: LAPAROTOMY;  Surgeon: Gae Dry, MD;  Location: [facility] ORS;  Service: Gynecology;  Laterality: N/A;  . OOPHORECTOMY    . OVARIAN CYST REMOVAL    . PORTA CATH INSERTION N/A 07/27/2020   Procedure: PORTA CATH INSERTION;  Surgeon: Algernon Huxley, MD;  Location: [facility];  Service: Cardiovascular;  Laterality: N/A;  . REPLACEMENT TOTAL KNEE Right   . SALPINGOOPHORECTOMY Bilateral 07/14/2020   Procedure: OPEN SALPINGO OOPHORECTOMY;  Surgeon: Gae Dry, MD;  Location: [facility] ORS;  Service: Gynecology;  Laterality: Bilateral;  . TOTAL SHOULDER REPLACEMENT Left     SOCIAL HISTORY: Social History   Socioeconomic History  . Marital status: Widowed    Spouse name: Not on file  . Number of children: 2  . Years of education: Not on file  . Highest education level: Not on file  Occupational History  . Occupation: retired   Tobacco Use  . Smoking status: Never Smoker  . Smokeless tobacco: Never Used  Vaping Use  . Vaping Use: Never used  Substance and Sexual Activity  . Alcohol use: No  . Drug use: No  . Sexual activity: Not on file  Other Topics Concern  . Not on file  Social History Narrative   Pt lives in 1 story home with her daughter, [name] and [name]'s husband   Has 2 adult daughters   Highest level of education: some college   Worked mainly as Web designer.   Social Determinants of Health   Financial Resource Strain: Not on file  Food Insecurity: Not on file  Transportation Needs: Not on file  Physical Activity: Not on file  Stress: Not on file  Social Connections: Not on file  Intimate Partner Violence: Not on file     FAMILY HISTORY: Family History  Problem Relation Age of Onset  . Diabetes Daughter   . Hypertension Daughter   . Fibromyalgia Daughter   . GER disease Daughter   . Fibromyalgia Daughter   . Crohn's disease Daughter   . Asthma Daughter   . Hypertension Mother   . Breast cancer Other   . Breast cancer Niece   . Uterine cancer Niece     ALLERGIES:  has No Known Allergies.  MEDICATIONS:  Current Outpatient Medications  Medication Sig Dispense Refill  . aspirin EC 325 MG tablet Take 325 mg by mouth daily.    [name] BOTOX 200 units SOLR Inject 1 vial into the muscle every 3 (three) months.    . Cranberry 1000 MG CAPS Take 1 capsule by mouth 2 (two) times daily.    [name] dexamethasone (DECADRON) 4 MG tablet Take 2 tablets (8 mg total) by mouth daily. Start the day after carboplatin chemotherapy for 3 days. 30 tablet 1  . donepezil (ARICEPT) 10  MG tablet Take 1 tablet (10 mg total) by mouth at bedtime. 90 tablet 3  . dorzolamide-timolol (COSOPT) 22.3-6.8 MG/ML ophthalmic solution Place 1 drop into both eyes 2 (two) times daily.    . DULoxetine (CYMBALTA) 60 MG capsule TAKE 1 CAPSULE EVERY DAY  FOR  DEPRESSION (Patient taking differently: Take 60 mg by mouth daily.) 90 capsule 1  . fluticasone (FLONASE) 50 MCG/ACT nasal spray Place 1 spray into both nostrils daily. (Patient taking differently: Place 1 spray into both nostrils daily as needed.) 48 g 0  . gabapentin (NEURONTIN) 800 MG tablet TAKE 1 TABLET BY MOUTH 3 TIMES A DAY FOR NEUROPATHY 270 tablet 1  . latanoprost (XALATAN) 0.005 % ophthalmic solution Place 1 drop into both eyes at bedtime.    [name] levothyroxine (SYNTHROID) 112 MCG tablet TAKE 1 TABLET EVERY MORNING ON AN EMPTY STOMACH WITH WATER ONLY.  NO FOOD OR OTHER MEDICATIONS FOR 30 MINUTES. (Patient taking differently: TAKE 1 TABLET EVERY MORNING ON AN EMPTY STOMACH WITH WATER ONLY.  NO FOOD OR OTHER MEDICATIONS FOR 30 MINUTES.) 90 tablet 1  . lidocaine-prilocaine (EMLA) cream Apply to  affected area once 30 g 3  . LORazepam (ATIVAN) 0.5 MG tablet Take 1 tablet (0.5 mg total) by mouth every 6 (six) hours as needed (Nausea or vomiting). 30 tablet 0  . meclizine (ANTIVERT) 25 MG tablet TAKE 1 TABLET (25 MG TOTAL) BY MOUTH 2 (TWO) TIMES DAILY AS NEEDED FOR DIZZINESS. 180 tablet 0  . meloxicam (MOBIC) 15 MG tablet TAKE 1 TABLET DAILY AS NEEDED FOR PAIN. 90 tablet 0  . Netarsudil Dimesylate (RHOPRESSA) 0.02 % SOLN Place 1 drop into both eyes daily at 6 (six) AM.    . omeprazole (PRILOSEC) 20 MG capsule TAKE 1 CAPSULE BY MOUTH TWICE DAILY FOR HEARTBURN. (Patient taking differently: Take 20 mg by mouth 2 (two) times daily before a meal. TAKE 1 CAPSULE BY MOUTH TWICE DAILY FOR HEARTBURN.) 180 capsule 1  . ondansetron (ZOFRAN) 8 MG tablet Take 1 tablet (8 mg total) by mouth 2 (two) times daily as needed for refractory nausea / vomiting. Start on day 3 after carboplatin chemo. 30 tablet 1  . pilocarpine (PILOCAR) 2 % ophthalmic solution Place 1 drop into both eyes 4 (four) times daily.     . potassium chloride SA (KLOR-CON) 20 MEQ tablet Take 1 tablet (20 mEq total) by mouth daily. 14 tablet 0  . prochlorperazine (COMPAZINE) 10 MG tablet Take 1 tablet (10 mg total) by mouth every 6 (six) hours as needed (Nausea or vomiting). 30 tablet 1  . propranolol (INDERAL) 80 MG tablet TAKE 1 TABLET ONE TIME DAILY FOR BLOOD PRESSURE (Patient taking differently: Take 80 mg by mouth daily.) 90 tablet 1  . rosuvastatin (CRESTOR) 10 MG tablet TAKE 1 TABLET DAILY FOR CHOLESTEROL. (Patient taking differently: Take 10 mg by mouth every evening.) 90 tablet 1  . traZODone (DESYREL) 100 MG tablet Take two tablets at bedtime as needed for sleep 180 tablet 0  . Accu-Chek FastClix Lancets MISC USE AS INSTRUCTED TO TEST BLOOD SUGAR DAILY (Patient not taking: No sig reported) 102 each 0  . ACCU-CHEK GUIDE test strip USE AS INSTRUCTED TO BLOOD SUGAR DAILY (Patient not taking: No sig reported) 100 strip 0  . blood glucose  meter kit and supplies Dispense based on patient and insurance preference. Use up to 2 times daily as directed. (FOR ICD-10 E10.9, E11.9). (Patient not taking: No sig reported) 1 each 0  . oxyCODONE-acetaminophen (PERCOCET/ROXICET)  5-325 MG tablet Take 1-2 tablets by mouth every 4 (four) hours as needed for moderate pain. (Patient not taking: Reported on 09/29/2020) 30 tablet 0   No current facility-administered medications for this visit.      [name]  PHYSICAL EXAMINATION: ECOG PERFORMANCE STATUS: 1 - Symptomatic but completely ambulatory  Vitals:   09/29/20 0936  BP: (!) 153/77  Pulse: (!) 58  Resp: 16  Temp: 98.4 F (36.9 C)  SpO2: 97%   Filed Weights   09/29/20 0936  Weight: 156 lb (70.8 kg)    Physical Exam Constitutional:      Comments: Patient in wheelchair.  Alone.  HENT:     Head: Normocephalic and atraumatic.     Mouth/Throat:     Pharynx: No oropharyngeal exudate.  Eyes:     Pupils: Pupils are equal, round, and reactive to light.  Cardiovascular:     Rate and Rhythm: Normal rate and regular rhythm.  Pulmonary:     Effort: Pulmonary effort is normal. No respiratory distress.     Breath sounds: Normal breath sounds. No wheezing.  Abdominal:     General: Bowel sounds are normal. There is no distension.     Palpations: Abdomen is soft. There is no mass.     Tenderness: There is no abdominal tenderness. There is no guarding or rebound.  Musculoskeletal:        General: No tenderness. Normal range of motion.     Cervical back: Normal range of motion and neck supple.  Skin:    General: Skin is warm.  Neurological:     Mental Status: She is alert and oriented to person, place, and time.  Psychiatric:        Mood and Affect: Affect normal.      LABORATORY DATA:  I have reviewed the data as listed Lab Results  Component Value Date   WBC 9.4 09/29/2020   HGB 10.6 (L) 09/29/2020   HCT 33.3 (L) 09/29/2020   MCV 91.2 09/29/2020   PLT 393 09/29/2020   Recent  Labs    01/20/20 1524 04/22/20 1850 04/23/20 0329 07/10/20 1132 07/21/20 1556 08/04/20 0856 08/14/20 1053 09/01/20 0843 09/29/20 0910  NA 135 137   < > 139 137   < > 134* 137 137  K 2.9* 3.4*   < > 4.0 3.0*   < > 4.7 4.2 3.6  CL 95* 102   < > 105 98   < > 102 100 100  CO2 27 25   < > 26 27   < > 22 26 26   GLUCOSE 119* 165*   < > 144* 112*   < > 153* 177* 154*  BUN 12 15   < > 18 11   < > 21 16 13   CREATININE 0.96 0.88   < > 0.91 0.84   < > 0.91 0.84 0.90  CALCIUM 8.9 9.4   < > 9.4 8.7*   < > 9.1 9.1 8.5*  GFRNONAA 55* >60   < > >60 >60   < > >60 >60 >60  GFRAA >60  --   --   --   --   --   --   --   --   PROT 7.8 7.6   < > 7.7 7.3  --   --   --  6.8  ALBUMIN 3.5 3.7   < > 4.2 3.4*  --   --   --  3.8  AST 22 21   < >  20 19  --   --   --  21  ALT 14 11   < > 11 11  --   --   --  11  ALKPHOS 88 75   < > 78 109  --   --   --  77  BILITOT 0.7 0.6   < > 0.5 0.5  --   --   --  0.7  BILIDIR  --  <0.1  --   --   --   --   --   --   --   IBILI  --  NOT CALCULATED  --   --   --   --   --   --   --    < > = values in this interval not displayed.    RADIOGRAPHIC STUDIES: I have personally reviewed the radiological images as listed and agreed with the findings in the report. No results found.  ASSESSMENT & PLAN:   Ovarian cancer, bilateral # high-grade serous carcinoma of the ovary FIGO stage I cpT1 cpNX cMX s/p bilateral salpingo-oophorectomy.s/p cycle 2 of adjuvant carbo Doxil chemotherapy [PN]  #Proceed with cycle #3 of adjuvant carbo-Doxil. Labs today reviewed;  acceptable for treatment today.   # Normocytic anemia:Hb ~10-  Likely secondary to chemotherapy- STABLE.   # DISPOSITION: # chemo today # follow up as planned with Dr.rao/labs- cbc/cmp;carbo-Doxil-Dr.B       All questions were answered. The patient knows to call the clinic with any problems, questions or concerns.       Cammie Sickle, MD 10/04/2020 7:10 PM

## 2020-09-29 NOTE — Assessment & Plan Note (Addendum)
#   high-grade serous carcinoma of the ovary FIGO stage I cpT1 cpNX cMX s/p bilateral salpingo-oophorectomy.s/p cycle 2 of adjuvant carbo Doxil chemotherapy [PN]  #Proceed with cycle #3 of adjuvant carbo-Doxil. Labs today reviewed;  acceptable for treatment today.   # Normocytic anemia:Hb ~10-  Likely secondary to chemotherapy- STABLE.   # DISPOSITION: # chemo today # follow up as planned with Dr.rao/labs- cbc/cmp;carbo-Doxil-Dr.B

## 2020-10-04 ENCOUNTER — Encounter: Payer: Self-pay | Admitting: Oncology

## 2020-10-05 ENCOUNTER — Telehealth: Payer: Self-pay | Admitting: Neurology

## 2020-10-05 NOTE — Telephone Encounter (Signed)
Daughter called in inquiring about her mothers Botox. Said she has been waiting on a call back. Mother  Has a migraine today

## 2020-10-09 ENCOUNTER — Other Ambulatory Visit: Payer: Self-pay | Admitting: Primary Care

## 2020-10-09 DIAGNOSIS — N390 Urinary tract infection, site not specified: Secondary | ICD-10-CM

## 2020-10-13 ENCOUNTER — Other Ambulatory Visit: Payer: Self-pay | Admitting: Primary Care

## 2020-10-13 DIAGNOSIS — R42 Dizziness and giddiness: Secondary | ICD-10-CM

## 2020-10-13 DIAGNOSIS — E119 Type 2 diabetes mellitus without complications: Secondary | ICD-10-CM

## 2020-10-13 DIAGNOSIS — K219 Gastro-esophageal reflux disease without esophagitis: Secondary | ICD-10-CM

## 2020-10-14 ENCOUNTER — Ambulatory Visit: Payer: Medicare HMO | Admitting: Internal Medicine

## 2020-10-16 ENCOUNTER — Other Ambulatory Visit: Payer: Self-pay

## 2020-10-16 ENCOUNTER — Ambulatory Visit (INDEPENDENT_AMBULATORY_CARE_PROVIDER_SITE_OTHER): Payer: Medicare HMO | Admitting: Neurology

## 2020-10-16 DIAGNOSIS — G43709 Chronic migraine without aura, not intractable, without status migrainosus: Secondary | ICD-10-CM | POA: Diagnosis not present

## 2020-10-16 MED ORDER — ONABOTULINUMTOXINA 100 UNITS IJ SOLR
200.0000 [IU] | Freq: Once | INTRAMUSCULAR | Status: AC
  Administered 2020-10-16: 155 [IU] via INTRAMUSCULAR

## 2020-10-16 NOTE — Progress Notes (Signed)
[facility]   Procedure Note Botox  Attending: Dr. Metta Clines  Preoperative Diagnosis(es): Chronic migraine  Consent obtained from: The patient Benefits discussed included, but were not limited to decreased muscle tightness, increased joint range of motion, and decreased pain.  Risk discussed included, but were not limited pain and discomfort, bleeding, bruising, excessive weakness, venous thrombosis, muscle atrophy and dysphagia.  Anticipated outcomes of the procedure as well as he risks and benefits of the alternatives to the procedure, and the roles and tasks of the personnel to be involved, were discussed with the patient, and the patient consents to the procedure and agrees to proceed. A copy of the patient medication guide was given to the patient which explains the blackbox warning.  Patients identity and treatment sites confirmed Yes.  .  Details of Procedure: Skin was cleaned with alcohol. Prior to injection, the needle plunger was aspirated to make sure the needle was not within a blood vessel.  There was no blood retrieved on aspiration.    Following is a summary of the muscles injected  And the amount of Botulinum toxin used:  Dilution 200 units of Botox was reconstituted with 4 ml of preservative free normal saline. Time of reconstitution: At the time of the office visit (<30 minutes prior to injection)   Injections  155 total units of Botox was injected with a 30 gauge needle.  Injection Sites: L occipitalis: 15 units- 3 sites  R occiptalis: 15 units- 3 sites  L upper trapezius: 15 units- 3 sites R upper trapezius: 15 units- 3 sits          L paraspinal: 10 units- 2 sites R paraspinal: 10 units- 2 sites  Face L frontalis(2 injection sites):10 units   R frontalis(2 injection sites):10 units         L corrugator: 5 units   R corrugator: 5 units           Procerus: 5 units   L temporalis: 20 units R temporalis: 20 units   Agent:  200 units of botulinum Type  A (Onobotulinum Toxin type A) was reconstituted with 4 ml of preservative free normal saline.  Time of reconstitution: At the time of the office visit (<30 minutes prior to injection)     Total injected (Units):  155  Total wasted (Units):  none wasted  Patient tolerated procedure well without complications.   Reinjection is anticipated in 3 months.

## 2020-10-27 ENCOUNTER — Encounter: Payer: Self-pay | Admitting: Oncology

## 2020-10-27 ENCOUNTER — Inpatient Hospital Stay: Payer: Medicare HMO

## 2020-10-27 ENCOUNTER — Inpatient Hospital Stay: Payer: Medicare HMO | Attending: Oncology | Admitting: Oncology

## 2020-10-27 ENCOUNTER — Other Ambulatory Visit: Payer: Self-pay

## 2020-10-27 VITALS — BP 158/79 | HR 55 | Temp 98.5°F | Resp 20 | Wt 154.9 lb

## 2020-10-27 DIAGNOSIS — G629 Polyneuropathy, unspecified: Secondary | ICD-10-CM | POA: Diagnosis not present

## 2020-10-27 DIAGNOSIS — C563 Malignant neoplasm of bilateral ovaries: Secondary | ICD-10-CM

## 2020-10-27 DIAGNOSIS — Z5189 Encounter for other specified aftercare: Secondary | ICD-10-CM | POA: Diagnosis not present

## 2020-10-27 DIAGNOSIS — Z5111 Encounter for antineoplastic chemotherapy: Secondary | ICD-10-CM | POA: Diagnosis not present

## 2020-10-27 DIAGNOSIS — E860 Dehydration: Secondary | ICD-10-CM

## 2020-10-27 LAB — CBC WITH DIFFERENTIAL/PLATELET
Abs Immature Granulocytes: 0.03 10*3/uL (ref 0.00–0.07)
Basophils Absolute: 0.1 10*3/uL (ref 0.0–0.1)
Basophils Relative: 1 %
Eosinophils Absolute: 0.1 10*3/uL (ref 0.0–0.5)
Eosinophils Relative: 1 %
HCT: 35 % — ABNORMAL LOW (ref 36.0–46.0)
Hemoglobin: 11 g/dL — ABNORMAL LOW (ref 12.0–15.0)
Immature Granulocytes: 0 %
Lymphocytes Relative: 24 %
Lymphs Abs: 1.8 10*3/uL (ref 0.7–4.0)
MCH: 29.7 pg (ref 26.0–34.0)
MCHC: 31.4 g/dL (ref 30.0–36.0)
MCV: 94.6 fL (ref 80.0–100.0)
Monocytes Absolute: 1.1 10*3/uL — ABNORMAL HIGH (ref 0.1–1.0)
Monocytes Relative: 14 %
Neutro Abs: 4.6 10*3/uL (ref 1.7–7.7)
Neutrophils Relative %: 60 %
Platelets: 292 10*3/uL (ref 150–400)
RBC: 3.7 MIL/uL — ABNORMAL LOW (ref 3.87–5.11)
RDW: 18 % — ABNORMAL HIGH (ref 11.5–15.5)
WBC: 7.6 10*3/uL (ref 4.0–10.5)
nRBC: 0 % (ref 0.0–0.2)

## 2020-10-27 LAB — COMPREHENSIVE METABOLIC PANEL
ALT: 11 U/L (ref 0–44)
AST: 20 U/L (ref 15–41)
Albumin: 3.8 g/dL (ref 3.5–5.0)
Alkaline Phosphatase: 83 U/L (ref 38–126)
Anion gap: 9 (ref 5–15)
BUN: 17 mg/dL (ref 8–23)
CO2: 27 mmol/L (ref 22–32)
Calcium: 8.9 mg/dL (ref 8.9–10.3)
Chloride: 103 mmol/L (ref 98–111)
Creatinine, Ser: 0.85 mg/dL (ref 0.44–1.00)
GFR, Estimated: >60 mL/min (ref >60)
Glucose, Bld: 136 mg/dL — ABNORMAL HIGH (ref 70–99)
Potassium: 3.9 mmol/L (ref 3.5–5.1)
Sodium: 139 mmol/L (ref 135–145)
Total Bilirubin: 0.5 mg/dL (ref 0.3–1.2)
Total Protein: 6.6 g/dL (ref 6.5–8.1)

## 2020-10-27 MED ORDER — DOXORUBICIN HCL LIPOSOMAL CHEMO INJECTION 2 MG/ML
28.0000 mg/m2 | Freq: Once | INTRAVENOUS | Status: AC
  Administered 2020-10-27: 50 mg via INTRAVENOUS
  Filled 2020-10-27: qty 25

## 2020-10-27 MED ORDER — DEXTROSE 5 % IV SOLN
Freq: Once | INTRAVENOUS | Status: AC
  Filled 2020-10-27: qty 250

## 2020-10-27 MED ORDER — SODIUM CHLORIDE 0.9 % IV SOLN
10.0000 mg | Freq: Once | INTRAVENOUS | Status: AC
  Administered 2020-10-27: 10 mg via INTRAVENOUS
  Filled 2020-10-27: qty 10

## 2020-10-27 MED ORDER — PALONOSETRON HCL INJECTION 0.25 MG/5ML
0.2500 mg | Freq: Once | INTRAVENOUS | Status: AC
  Administered 2020-10-27: 0.25 mg via INTRAVENOUS
  Filled 2020-10-27: qty 5

## 2020-10-27 MED ORDER — SODIUM CHLORIDE 0.9 % IV SOLN
150.0000 mg | Freq: Once | INTRAVENOUS | Status: AC
  Administered 2020-10-27: 150 mg via INTRAVENOUS
  Filled 2020-10-27: qty 150

## 2020-10-27 MED ORDER — SODIUM CHLORIDE 0.9 % IV SOLN
Freq: Once | INTRAVENOUS | Status: AC
Start: 2020-10-27 — End: 2020-10-27
  Administered 2020-10-27: 1000 mL via INTRAVENOUS
  Filled 2020-10-27: qty 250

## 2020-10-27 MED ORDER — SODIUM CHLORIDE 0.9 % IV SOLN
358.5000 mg | Freq: Once | INTRAVENOUS | Status: AC
  Administered 2020-10-27: 360 mg via INTRAVENOUS
  Filled 2020-10-27: qty 36

## 2020-10-27 MED ORDER — HEPARIN SOD (PORK) LOCK FLUSH 100 UNIT/ML IV SOLN
INTRAVENOUS | Status: AC
  Filled 2020-10-27: qty 5

## 2020-10-27 MED ORDER — PEGFILGRASTIM 6 MG/0.6ML ~~LOC~~ PSKT
6.0000 mg | PREFILLED_SYRINGE | Freq: Once | SUBCUTANEOUS | Status: AC
  Administered 2020-10-27: 6 mg via SUBCUTANEOUS
  Filled 2020-10-27: qty 0.6

## 2020-10-27 MED ORDER — HEPARIN SOD (PORK) LOCK FLUSH 100 UNIT/ML IV SOLN
500.0000 [IU] | Freq: Once | INTRAVENOUS | Status: AC | PRN
  Administered 2020-10-27: 500 [IU]
  Filled 2020-10-27: qty 5

## 2020-10-27 MED ORDER — SODIUM CHLORIDE 0.9 % IV SOLN
Freq: Once | INTRAVENOUS | Status: AC
Start: 2020-10-27 — End: 2020-10-27
  Filled 2020-10-27: qty 250

## 2020-10-27 NOTE — Progress Notes (Signed)
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  She will is diagnosed with UTI and as a part of her Work-up had CT abdomen and pelvis with contrast.  That showed a 13.5 11.8 x 15.2 cm simple cystic appearing area within the pelvis.  Ultrasound of the pelvis showed 17 x 10.7 x 11.9 cm complex cystic midline pelvic mass.  Findings indeterminate and could reflect a cystic ovarian neoplasm.  She did have CA-125 checked which was normal at 22.9 but HD4 was elevated at 99.  Patient underwent bilateral salpingo-oophorectomy.  She has had prior hysterectomy.  Pathology showed high-grade serous carcinoma involving both ovaries but no involvement of the fallopian tube.  Omentum was negative for malignancy.  Lymph nodes not sampled.  Tumor size 4 cm high-grade.  FIGO stage IC peritoneal/ascitic fluid involvement was not identified.  Patient has baseline neuropathy. Therefore carbo/doxil chosen for adjuvant chemotherapy upto 6 cycles if tolerated.     Interval history-patient is here with her daughter today.  Overall tolerating chemotherapy well.  She does report more fatigue with previous cycle.  She remains independent of her ADLs.  She has baseline neuropathy even prior to starting  chemotherapy which is essentially remained stable  ECOG PS- 1 Pain scale- 0   Review of systems- Review of Systems  Constitutional:  Positive for malaise/fatigue. Negative for chills, fever and weight loss.  HENT:  Negative for congestion, ear discharge and nosebleeds.   Eyes:  Negative for blurred vision.  Respiratory:  Negative for cough, hemoptysis, sputum production, shortness of breath and wheezing.   Cardiovascular:  Negative for chest pain, palpitations, orthopnea and claudication.  Gastrointestinal:  Negative for abdominal pain, blood in stool, constipation, diarrhea, heartburn, melena, nausea and vomiting.  Genitourinary:  Negative for dysuria, flank pain, frequency, hematuria and urgency.  Musculoskeletal:  Negative for back pain, joint pain and myalgias.  Skin:  Negative for rash.  Neurological:  Positive for sensory change (Peripheral neuropathy). Negative for dizziness, tingling, focal weakness, seizures, weakness and headaches.  Endo/Heme/Allergies:  Does not bruise/bleed easily.  Psychiatric/Behavioral:  Negative for depression and suicidal ideas. The patient does not have insomnia.     No Known Allergies   Past Medical History:  Diagnosis Date   Acute tubular injury of transplanted kidney ([place]) 06/14/2016   AKI (acute kidney injury) ([place]) 06/14/2016   Asthma    Breast cancer ([place]) 2015   left   Bronchitis    CAP (community acquired pneumonia) 04/29/2012   Chronic sinusitis    Confusion 12/06/2016   Diabetes mellitus without complication (HCC)    Family history of breast cancer    GERD (gastroesophageal reflux disease)    History of ankle fracture 05/14/2018   Last Assessment & Plan:  With a recent fall. Right medial malleous. Has ortho follow up soon, splinted now and tylenol  helps her pain   History of recurrent UTIs    Hypertension    Hypokalemia 04/26/2012   Hyponatremia    Hypothyroidism    Insomnia    Migraine    Neuropathic pain    Ovarian cancer ([place])  2022   Personal history of breast cancer    Personal history of chemotherapy current   bilateral ovarian ca   Pneumonia    Rheumatoid arthritis ([place])    Sepsis ([place]) 04/26/2012   Sleep apnea    Stroke ([place])    seen in CT scan   Thyroid disease      Past Surgical History:  Procedure Laterality Date   ABDOMINAL HYSTERECTOMY     BACK SURGERY     BREAST BIOPSY Right ?`   benign   BREAST LUMPECTOMY Left 2015   breast ca   CHOLECYSTECTOMY     JOINT REPLACEMENT     LAPAROTOMY N/A 07/14/2020   Procedure: LAPAROTOMY;  Surgeon: Gae Dry, MD;  Location: [facility] ORS;  Service: Gynecology;  Laterality: N/A;   OOPHORECTOMY     OVARIAN CYST REMOVAL     PORTA CATH INSERTION N/A 07/27/2020   Procedure: PORTA CATH INSERTION;  Surgeon: Algernon Huxley, MD;  Location: [facility];  Service: Cardiovascular;  Laterality: N/A;   REPLACEMENT TOTAL KNEE Right    SALPINGOOPHORECTOMY Bilateral 07/14/2020   Procedure: OPEN SALPINGO OOPHORECTOMY;  Surgeon: Gae Dry, MD;  Location: [facility] OR[facility];  Service: Gynecology;  Laterality: Bilateral;   TOTAL SHOULDER REPLACEMENT Left     Social History   Socioeconomic History   Marital status: Widowed    Spouse name: Not on file   Number of children: 2   Years of education: Not on file   Highest education level: Not on file  Occupational History   Occupation: retired   Tobacco Use   Smoking status: Never   Smokeless tobacco: Never  Vaping Use   Vaping Use: Never used  Substance and Sexual Activity   Alcohol use: No   Drug use: No   Sexual activity: Not on file  Other Topics Concern   Not on file  Social History Narrative   Pt lives in 1 story home with her daughter, [name] and [name]'s husband   Has 2 adult daughters   Highest level of education: some college   Worked mainly as Web designer.   Social Determinants of Health   Financial Resource Strain: Not on file  Food Insecurity: Not on file  Transportation Needs: Not on  file  Physical Activity: Not on file  Stress: Not on file  Social Connections: Not on file  Intimate Partner Violence: Not on file    Family History  Problem Relation Age of Onset   Diabetes Daughter    Hypertension Daughter    Fibromyalgia Daughter    GER disease Daughter    Fibromyalgia Daughter    Crohn's disease Daughter    Asthma Daughter    Hypertension Mother    Breast cancer Other    Breast cancer Niece    Uterine cancer Niece      Current Outpatient Medications:    Accu-Chek FastClix Lancets MISC, USE AS INSTRUCTED TO TEST BLOOD SUGAR DAILY, Disp: 102 each, Rfl: 1   ACCU-CHEK GUIDE test strip, USE AS INSTRUCTED TO TEST BLOOD SUGAR DAILY, Disp: 100 strip, Rfl: 1   aspirin EC 325 MG tablet, Take 325 mg by mouth daily., Disp: , Rfl:    blood glucose meter kit  and supplies, Dispense based on patient and insurance preference. Use up to 2 times daily as directed. (FOR ICD-10 E10.9, E11.9)., Disp: 1 each, Rfl: 0   BOTOX 200 units SOLR, Inject 1 vial into the muscle every 3 (three) months., Disp: , Rfl:    Cranberry 1000 MG CAPS, Take 1 capsule by mouth 2 (two) times daily., Disp: , Rfl:    dexamethasone (DECADRON) 4 MG tablet, Take 2 tablets (8 mg total) by mouth daily. Start the day after carboplatin chemotherapy for 3 days., Disp: 30 tablet, Rfl: 1   donepezil (ARICEPT) 10 MG tablet, Take 1 tablet (10 mg total) by mouth at bedtime., Disp: 90 tablet, Rfl: 3   dorzolamide-timolol (COSOPT) 22.3-6.8 MG/ML ophthalmic solution, Place 1 drop into both eyes 2 (two) times daily., Disp: , Rfl:    DULoxetine (CYMBALTA) 60 MG capsule, TAKE 1 CAPSULE EVERY DAY  FOR  DEPRESSION (Patient taking differently: Take 60 mg by mouth daily.), Disp: 90 capsule, Rfl: 1   fluticasone (FLONASE) 50 MCG/ACT nasal spray, Place 2 sprays into both nostrils daily as needed for allergies or rhinitis., Disp: 48 g, Rfl: 0   gabapentin (NEURONTIN) 800 MG tablet, TAKE 1 TABLET BY MOUTH 3 TIMES A DAY FOR  NEUROPATHY, Disp: 270 tablet, Rfl: 1   latanoprost (XALATAN) 0.005 % ophthalmic solution, Place 1 drop into both eyes at bedtime., Disp: , Rfl:    levothyroxine (SYNTHROID) 112 MCG tablet, TAKE 1 TABLET EVERY MORNING ON AN EMPTY STOMACH WITH WATER ONLY.  NO FOOD OR OTHER MEDICATIONS FOR 30 MINUTES. (Patient taking differently: TAKE 1 TABLET EVERY MORNING ON AN EMPTY STOMACH WITH WATER ONLY.  NO FOOD OR OTHER MEDICATIONS FOR 30 MINUTES.), Disp: 90 tablet, Rfl: 1   lidocaine-prilocaine (EMLA) cream, Apply to affected area once, Disp: 30 g, Rfl: 3   LORazepam (ATIVAN) 0.5 MG tablet, Take 1 tablet (0.5 mg total) by mouth every 6 (six) hours as needed (Nausea or vomiting)., Disp: 30 tablet, Rfl: 0   meclizine (ANTIVERT) 25 MG tablet, TAKE 1 TABLET (25 MG TOTAL) BY MOUTH 2 (TWO) TIMES DAILY AS NEEDED FOR DIZZINESS., Disp: 180 tablet, Rfl: 0   meloxicam (MOBIC) 15 MG tablet, TAKE 1 TABLET DAILY AS NEEDED FOR PAIN., Disp: 90 tablet, Rfl: 0   Netarsudil Dimesylate (RHOPRESSA) 0.02 % SOLN, Place 1 drop into both eyes daily at 6 (six) AM., Disp: , Rfl:    omeprazole (PRILOSEC) 20 MG capsule, TAKE 1 CAPSULE TWICE DAILY FOR HEARTBURN., Disp: 180 capsule, Rfl: 1   ondansetron (ZOFRAN) 8 MG tablet, Take 1 tablet (8 mg total) by mouth 2 (two) times daily as needed for refractory nausea / vomiting. Start on day 3 after carboplatin chemo., Disp: 30 tablet, Rfl: 1   oxyCODONE-acetaminophen (PERCOCET/ROXICET) 5-325 MG tablet, Take 1-2 tablets by mouth every 4 (four) hours as needed for moderate pain., Disp: 30 tablet, Rfl: 0   pilocarpine (PILOCAR) 2 % ophthalmic solution, Place 1 drop into both eyes 4 (four) times daily. , Disp: , Rfl:    prochlorperazine (COMPAZINE) 10 MG tablet, Take 1 tablet (10 mg total) by mouth every 6 (six) hours as needed (Nausea or vomiting)., Disp: 30 tablet, Rfl: 1   propranolol (INDERAL) 80 MG tablet, TAKE 1 TABLET ONE TIME DAILY FOR BLOOD PRESSURE (Patient taking differently: Take 80 mg by  mouth daily.), Disp: 90 tablet, Rfl: 1   rosuvastatin (CRESTOR) 10 MG tablet, TAKE 1 TABLET DAILY FOR CHOLESTEROL. (Patient taking differently: Take 10 mg by mouth every evening.),  Disp: 90 tablet, Rfl: 1   traZODone (DESYREL) 100 MG tablet, Take two tablets at bedtime as needed for sleep, Disp: 180 tablet, Rfl: 0 No current facility-administered medications for this visit.  Facility-Administered Medications Ordered in Other Visits:    CARBOplatin (PARAPLATIN) 360 mg in sodium chloride 0.9 % 250 mL chemo infusion, 360 mg, Intravenous, Once, Sindy Guadeloupe, MD, Last Rate: 572 mL/hr at 10/27/20 1244, 360 mg at 10/27/20 1244   heparin lock flush 100 unit/mL, 500 Units, Intracatheter, Once PRN, Sindy Guadeloupe, MD   pegfilgrastim (NEULASTA ONPRO KIT) injection 6 mg, 6 mg, Subcutaneous, Once, Sindy Guadeloupe, MD  Physical exam:  Vitals:   10/27/20 0900  BP: (!) 158/79  Pulse: (!) 55  Resp: 20  Temp: 98.5 F (36.9 C)  TempSrc: Tympanic  SpO2: 98%  Weight: 154 lb 14.4 oz (70.3 kg)   Physical Exam Constitutional:      General: She is not in acute distress.    Comments: Sitting in a wheelchair.  Appears in no acute distress  Cardiovascular:     Rate and Rhythm: Normal rate and regular rhythm.     Heart sounds: Normal heart sounds.  Pulmonary:     Effort: Pulmonary effort is normal.     Breath sounds: Normal breath sounds.  Abdominal:     General: Bowel sounds are normal.     Palpations: Abdomen is soft.  Musculoskeletal:     Right lower leg: No edema.     Left lower leg: No edema.  Skin:    General: Skin is warm and dry.  Neurological:     Mental Status: She is alert and oriented to person, place, and time.     CMP Latest Ref Rng & Units 10/27/2020  Glucose 70 - 99 mg/dL 136(H)  BUN 8 - 23 mg/dL 17  Creatinine 0.44 - 1.00 mg/dL 0.85  Sodium 135 - 145 mmol/L 139  Potassium 3.5 - 5.1 mmol/L 3.9  Chloride 98 - 111 mmol/L 103  CO2 22 - 32 mmol/L 27  Calcium 8.9 - 10.3 mg/dL 8.9   Total Protein 6.5 - 8.1 g/dL 6.6  Total Bilirubin 0.3 - 1.2 mg/dL 0.5  Alkaline Phos 38 - 126 U/L 83  AST 15 - 41 U/L 20  ALT 0 - 44 U/L 11   CBC Latest Ref Rng & Units 10/27/2020  WBC 4.0 - 10.5 K/uL 7.6  Hemoglobin 12.0 - 15.0 g/dL 11.0(L)  Hematocrit 36.0 - 46.0 % 35.0(L)  Platelets 150 - 400 K/uL 292     Assessment and plan- Patient is a 86 y.o. female with high-grade serous carcinoma of the ovary FIGO stage I cpT1 cpNX cMX s/p bilateral salpingo-oophorectomy.  She is here for on treatment assessment prior to cycle 4 of adjuvant carbo Doxil chemotherapy  Counts okay to proceed with cycle 4 of carbo Doxil chemotherapy today with on pro Neulasta support.  She will also receive 1 L of IV fluids today.  I will see her back in 4 weeks for cycle 5.  Plan to repeat echocardiogram prior.  She will let us know if she needs to be seen in between treatments for possible IV fluids.  Overall she seems to be tolerating chemotherapy well without any significant side effects   Visit Diagnosis 1. Ovarian cancer, bilateral   2. Encounter for antineoplastic chemotherapy   3. Peripheral polyneuropathy      Dr. Randa Evens, MD, MPH [facility] [phone] 10/27/2020 1:09 PM

## 2020-10-27 NOTE — Patient Instructions (Signed)
[facility]   Discharge Instructions: Thank you for choosing [facility] to provide your oncology and hematology care.  If you have a lab appointment with the [facility], please go directly to the [facility] and check in at the registration area.  Wear comfortable clothing and clothing appropriate for easy access to any Portacath or PICC line.   We strive to give you quality time with your provider. You may need to reschedule your appointment if you arrive late (15 or more minutes).  Arriving late affects you and other patients whose appointments are after yours.  Also, if you miss three or more appointments without notifying the office, you may be dismissed from the clinic at the provider's discretion.      For prescription refill requests, have your pharmacy contact our office and allow 72 hours for refills to be completed.    Today you received the following chemotherapy and/or immunotherapy agents: Doxil, Carboplatin.      To help prevent nausea and vomiting after your treatment, we encourage you to take your nausea medication as directed.  BELOW ARE SYMPTOMS THAT SHOULD BE REPORTED IMMEDIATELY: *FEVER GREATER THAN 100.4 F (38 C) OR HIGHER *CHILLS OR SWEATING *NAUSEA AND VOMITING THAT IS NOT CONTROLLED WITH YOUR NAUSEA MEDICATION *UNUSUAL SHORTNESS OF BREATH *UNUSUAL BRUISING OR BLEEDING *URINARY PROBLEMS (pain or burning when urinating, or frequent urination) *BOWEL PROBLEMS (unusual diarrhea, constipation, pain near the anus) TENDERNESS IN MOUTH AND THROAT WITH OR WITHOUT PRESENCE OF ULCERS (sore throat, sores in mouth, or a toothache) UNUSUAL RASH, SWELLING OR PAIN  UNUSUAL VAGINAL DISCHARGE OR ITCHING   Items with * indicate a potential emergency and should be followed up as soon as possible or go to the Emergency Department if any problems should occur.  Please show the CHEMOTHERAPY ALERT CARD or IMMUNOTHERAPY ALERT CARD  at check-in to the Emergency Department and triage nurse.  Should you have questions after your visit or need to cancel or reschedule your appointment, please contact [facility]  [phone] and follow the prompts.  Office hours are 8:00 a.m. to 4:30 p.m. Monday - Friday. Please note that voicemails left after 4:00 p.m. may not be returned until the following business day.  We are closed weekends and major holidays. You have access to a nurse at all times for urgent questions. Please call the main number to the clinic [phone] and follow the prompts.  For any non-urgent questions, you may also contact your provider using MyChart. We now offer e-Visits for anyone 56 and older to request care online for non-urgent symptoms. For details visit [email or web address].   Also download the MyChart app! Go to the app store, search "MyChart", open the app, select [facility], and log in with your MyChart username and password.  Due to Covid, a mask is required upon entering the hospital/clinic. If you do not have a mask, one will be given to you upon arrival. For doctor visits, patients may have 1 support person aged 59 or older with them. For treatment visits, patients cannot have anyone with them due to current Covid guidelines and our immunocompromised population.

## 2020-10-29 DIAGNOSIS — K219 Gastro-esophageal reflux disease without esophagitis: Secondary | ICD-10-CM

## 2020-11-05 ENCOUNTER — Encounter: Payer: Self-pay | Admitting: Oncology

## 2020-11-09 ENCOUNTER — Encounter: Payer: Self-pay | Admitting: *Deleted

## 2020-11-14 ENCOUNTER — Other Ambulatory Visit: Payer: Self-pay | Admitting: Primary Care

## 2020-11-14 DIAGNOSIS — G4700 Insomnia, unspecified: Secondary | ICD-10-CM

## 2020-11-16 DIAGNOSIS — H401133 Primary open-angle glaucoma, bilateral, severe stage: Secondary | ICD-10-CM | POA: Diagnosis not present

## 2020-11-16 NOTE — Telephone Encounter (Signed)
09/01/2020 refill states patient will need to be seen for more refills. Do not see where she was seen. Please advise.

## 2020-11-17 ENCOUNTER — Other Ambulatory Visit: Payer: Self-pay

## 2020-11-17 ENCOUNTER — Ambulatory Visit
Admission: RE | Admit: 2020-11-17 | Discharge: 2020-11-17 | Disposition: A | Discharge status: Home / Self Care | Payer: Medicare HMO | Source: Ambulatory Visit | Attending: Oncology | Admitting: Oncology

## 2020-11-17 DIAGNOSIS — I10 Essential (primary) hypertension: Secondary | ICD-10-CM | POA: Diagnosis not present

## 2020-11-17 DIAGNOSIS — Z0189 Encounter for other specified special examinations: Secondary | ICD-10-CM

## 2020-11-17 DIAGNOSIS — Z853 Personal history of malignant neoplasm of breast: Secondary | ICD-10-CM | POA: Diagnosis not present

## 2020-11-17 DIAGNOSIS — E119 Type 2 diabetes mellitus without complications: Secondary | ICD-10-CM | POA: Insufficient documentation

## 2020-11-17 DIAGNOSIS — G4730 Sleep apnea, unspecified: Secondary | ICD-10-CM | POA: Diagnosis not present

## 2020-11-17 DIAGNOSIS — C563 Malignant neoplasm of bilateral ovaries: Secondary | ICD-10-CM | POA: Insufficient documentation

## 2020-11-17 DIAGNOSIS — Z5111 Encounter for antineoplastic chemotherapy: Secondary | ICD-10-CM

## 2020-11-17 LAB — ECHOCARDIOGRAM COMPLETE
AR max vel: 1.59 cm2
AV Area VTI: 1.65 cm2
AV Area mean vel: 1.49 cm2
AV Mean grad: 5 mmHg
AV Peak grad: 8.8 mmHg
Ao pk vel: 1.48 m/s
Area-P 1/2: 3.55 cm2
MV VTI: 1.48 cm2
S' Lateral: 2.25 cm

## 2020-11-17 NOTE — Telephone Encounter (Signed)
Not sure why her refill states that. Please call her daughter, check in on her, tell her we've been thinking about her!  Ask if you can go over medication list to verify that she's still taking 200 mg of Trazodone HS along with her other medications as listed.   She was going through chemo for ovarian cancer so not sure if her meds are still the same.

## 2020-11-17 NOTE — Telephone Encounter (Signed)
Called spoke to daughter updated all meds she is on 200mg  of the trazodone at night.

## 2020-11-17 NOTE — Progress Notes (Signed)
*  PRELIMINARY RESULTS* Echocardiogram 2D Echocardiogram has been performed.  Shannon Higgins 11/17/2020, 10:53 AM

## 2020-11-24 ENCOUNTER — Encounter: Payer: Self-pay | Admitting: Oncology

## 2020-11-24 ENCOUNTER — Inpatient Hospital Stay: Payer: Medicare HMO

## 2020-11-24 ENCOUNTER — Ambulatory Visit: Payer: Medicare HMO

## 2020-11-24 ENCOUNTER — Inpatient Hospital Stay: Payer: Medicare HMO | Attending: Oncology | Admitting: Oncology

## 2020-11-24 ENCOUNTER — Other Ambulatory Visit: Payer: Self-pay

## 2020-11-24 VITALS — BP 145/67 | HR 66 | Temp 98.3°F | Resp 20 | Wt 154.9 lb

## 2020-11-24 VITALS — BP 146/71 | HR 70 | Temp 97.5°F | Resp 18

## 2020-11-24 DIAGNOSIS — E860 Dehydration: Secondary | ICD-10-CM

## 2020-11-24 DIAGNOSIS — C563 Malignant neoplasm of bilateral ovaries: Secondary | ICD-10-CM

## 2020-11-24 DIAGNOSIS — Z5111 Encounter for antineoplastic chemotherapy: Secondary | ICD-10-CM

## 2020-11-24 DIAGNOSIS — Z5189 Encounter for other specified aftercare: Secondary | ICD-10-CM | POA: Diagnosis not present

## 2020-11-24 DIAGNOSIS — R5383 Other fatigue: Secondary | ICD-10-CM | POA: Insufficient documentation

## 2020-11-24 LAB — CBC WITH DIFFERENTIAL/PLATELET
Abs Immature Granulocytes: 0.04 10*3/uL (ref 0.00–0.07)
Basophils Absolute: 0 10*3/uL (ref 0.0–0.1)
Basophils Relative: 0 %
Eosinophils Absolute: 0 10*3/uL (ref 0.0–0.5)
Eosinophils Relative: 0 %
HCT: 33.1 % — ABNORMAL LOW (ref 36.0–46.0)
Hemoglobin: 10.3 g/dL — ABNORMAL LOW (ref 12.0–15.0)
Immature Granulocytes: 0 %
Lymphocytes Relative: 18 %
Lymphs Abs: 1.8 10*3/uL (ref 0.7–4.0)
MCH: 30.7 pg (ref 26.0–34.0)
MCHC: 31.1 g/dL (ref 30.0–36.0)
MCV: 98.8 fL (ref 80.0–100.0)
Monocytes Absolute: 1.3 10*3/uL — ABNORMAL HIGH (ref 0.1–1.0)
Monocytes Relative: 13 %
Neutro Abs: 6.6 10*3/uL (ref 1.7–7.7)
Neutrophils Relative %: 69 %
Platelets: 296 10*3/uL (ref 150–400)
RBC: 3.35 MIL/uL — ABNORMAL LOW (ref 3.87–5.11)
RDW: 19.8 % — ABNORMAL HIGH (ref 11.5–15.5)
WBC: 9.8 10*3/uL (ref 4.0–10.5)
nRBC: 0 % (ref 0.0–0.2)

## 2020-11-24 LAB — COMPREHENSIVE METABOLIC PANEL
ALT: 11 U/L (ref 0–44)
AST: 22 U/L (ref 15–41)
Albumin: 4 g/dL (ref 3.5–5.0)
Alkaline Phosphatase: 80 U/L (ref 38–126)
Anion gap: 12 (ref 5–15)
BUN: 12 mg/dL (ref 8–23)
CO2: 21 mmol/L — ABNORMAL LOW (ref 22–32)
Calcium: 8.5 mg/dL — ABNORMAL LOW (ref 8.9–10.3)
Chloride: 107 mmol/L (ref 98–111)
Creatinine, Ser: 0.93 mg/dL (ref 0.44–1.00)
GFR, Estimated: >60 mL/min (ref >60)
Glucose, Bld: 170 mg/dL — ABNORMAL HIGH (ref 70–99)
Potassium: 3.6 mmol/L (ref 3.5–5.1)
Sodium: 140 mmol/L (ref 135–145)
Total Bilirubin: 0.7 mg/dL (ref 0.3–1.2)
Total Protein: 6.9 g/dL (ref 6.5–8.1)

## 2020-11-24 MED ORDER — SODIUM CHLORIDE 0.9% FLUSH
10.0000 mL | Freq: Once | INTRAVENOUS | Status: AC
Start: 2020-11-24 — End: 2020-11-24
  Administered 2020-11-24: 10 mL via INTRAVENOUS
  Filled 2020-11-24: qty 10

## 2020-11-24 MED ORDER — DEXAMETHASONE SODIUM PHOSPHATE 10 MG/ML IJ SOLN
INTRAMUSCULAR | Status: AC
  Filled 2020-11-24: qty 1

## 2020-11-24 MED ORDER — HEPARIN SOD (PORK) LOCK FLUSH 100 UNIT/ML IV SOLN
500.0000 [IU] | Freq: Once | INTRAVENOUS | Status: AC
  Administered 2020-11-24: 500 [IU] via INTRAVENOUS
  Filled 2020-11-24: qty 5

## 2020-11-24 MED ORDER — DEXAMETHASONE SODIUM PHOSPHATE 10 MG/ML IJ SOLN: 10.0000 mg | Freq: Once | INTRAMUSCULAR | Status: DC

## 2020-11-24 MED ORDER — SODIUM CHLORIDE 0.9 % IV SOLN
Freq: Once | INTRAVENOUS | Status: AC
  Filled 2020-11-24: qty 250

## 2020-11-24 MED ORDER — SODIUM CHLORIDE 0.9 % IV SOLN
10.0000 mg | Freq: Once | INTRAVENOUS | Status: AC
  Administered 2020-11-24: 10 mg via INTRAVENOUS
  Filled 2020-11-24: qty 10

## 2020-11-24 NOTE — Progress Notes (Signed)
Hematology/Oncology Consult note [facility]  [phone] [phone]  Patient Care Team: Pleas Koch, NP as PCP - General (Internal Medicine) Cameron Sprang, MD as Consulting Physician (Neurology) Clent Jacks, RN as Oncology Nurse Navigator   Name of the patient: Shannon Higgins  [id number]  [birth year]   Date of visit: 11/24/20  Diagnosis- high-grade serous carcinoma of the ovary FIGO stage IC pT1 cpNX cMX s/p bilateral salpingo-oophorectomy  Chief complaint/ Reason for visit-on treatment assessment prior to cycle 5 of carbo Doxil chemotherapy  Heme/Onc history: patient is a 86 year old female with a past medical history significant for type 2 diabetes, UTIs, hypertension GERD among other medical problems.  She will is diagnosed with UTI and as a part of her Work-up had CT abdomen and pelvis with contrast.  That showed a 13.5 11.8 x 15.2 cm simple cystic appearing area within the pelvis.  Ultrasound of the pelvis showed 17 x 10.7 x 11.9 cm complex cystic midline pelvic mass.  Findings indeterminate and could reflect a cystic ovarian neoplasm.  She did have CA-125 checked which was normal at 22.9 but HD4 was elevated at 99.  Patient underwent bilateral salpingo-oophorectomy.  She has had prior hysterectomy.  Pathology showed high-grade serous carcinoma involving both ovaries but no involvement of the fallopian tube.  Omentum was negative for malignancy.  Lymph nodes not sampled.  Tumor size 4 cm high-grade.  FIGO stage IC peritoneal/ascitic fluid involvement was not identified.  Patient has baseline neuropathy. Therefore carbo/doxil chosen for adjuvant chemotherapy upto 6 cycles if tolerated.    Genetic testing was negative.  Interval history-patient is here with her daughter today.  Daughter reports that patient mainly spends her time in bed.  She has been feeling more fatigued with chemotherapy and at times confused.  Appetite is  fair.  Today patient reports she is feeling better  ECOG PS- 2 Pain scale- 0   Review of systems- Review of Systems  Constitutional:  Positive for malaise/fatigue. Negative for chills, fever and weight loss.  HENT:  Negative for congestion, ear discharge and nosebleeds.   Eyes:  Negative for blurred vision.  Respiratory:  Negative for cough, hemoptysis, sputum production, shortness of breath and wheezing.   Cardiovascular:  Negative for chest pain, palpitations, orthopnea and claudication.  Gastrointestinal:  Negative for abdominal pain, blood in stool, constipation, diarrhea, heartburn, melena, nausea and vomiting.  Genitourinary:  Negative for dysuria, flank pain, frequency, hematuria and urgency.  Musculoskeletal:  Negative for back pain, joint pain and myalgias.  Skin:  Negative for rash.  Neurological:  Positive for sensory change (Peripheral neuropathy). Negative for dizziness, tingling, focal weakness, seizures, weakness and headaches.  Endo/Heme/Allergies:  Does not bruise/bleed easily.  Psychiatric/Behavioral:  Negative for depression and suicidal ideas. The patient does not have insomnia.      No Known Allergies   Past Medical History:  Diagnosis Date   Acute tubular injury of transplanted kidney ([place]) 06/14/2016   AKI (acute kidney injury) ([place]) 06/14/2016   Asthma    Breast cancer ([place]) 2015   left   Bronchitis    CAP (community acquired pneumonia) 04/29/2012   Chronic sinusitis    Confusion 12/06/2016   Diabetes mellitus without complication (HCC)    Family history of breast cancer    GERD (gastroesophageal reflux disease)    History of ankle fracture 05/14/2018   Last Assessment & Plan:  With a recent fall. Right medial malleous. Has ortho follow up soon,  splinted now and tylenol helps her pain   History of recurrent UTIs    Hypertension    Hypokalemia 04/26/2012   Hyponatremia    Hypothyroidism    Insomnia    Migraine    Neuropathic pain    Ovarian cancer  ([place]) 2022   Personal history of breast cancer    Personal history of chemotherapy current   bilateral ovarian ca   Pneumonia    Rheumatoid arthritis ([place])    Sepsis ([place]) 04/26/2012   Sleep apnea    Stroke ([place])    seen in CT scan   Thyroid disease      Past Surgical History:  Procedure Laterality Date   ABDOMINAL HYSTERECTOMY     BACK SURGERY     BREAST BIOPSY Right ?`   benign   BREAST LUMPECTOMY Left 2015   breast ca   CHOLECYSTECTOMY     JOINT REPLACEMENT     LAPAROTOMY N/A 07/14/2020   Procedure: LAPAROTOMY;  Surgeon: Gae Dry, MD;  Location: [facility];  Service: Gynecology;  Laterality: N/A;   OOPHORECTOMY     OVARIAN CYST REMOVAL     PORTA CATH INSERTION N/A 07/27/2020   Procedure: PORTA CATH INSERTION;  Surgeon: Algernon Huxley, MD;  Location: [facility];  Service: Cardiovascular;  Laterality: N/A;   REPLACEMENT TOTAL KNEE Right    SALPINGOOPHORECTOMY Bilateral 07/14/2020   Procedure: OPEN SALPINGO OOPHORECTOMY;  Surgeon: Gae Dry, MD;  Location: [facility];  Service: Gynecology;  Laterality: Bilateral;   TOTAL SHOULDER REPLACEMENT Left     Social History   Socioeconomic History   Marital status: Widowed    Spouse name: Not on file   Number of children: 2   Years of education: Not on file   Highest education level: Not on file  Occupational History   Occupation: retired   Tobacco Use   Smoking status: Never   Smokeless tobacco: Never  Vaping Use   Vaping Use: Never used  Substance and Sexual Activity   Alcohol use: No   Drug use: No   Sexual activity: Not on file  Other Topics Concern   Not on file  Social History Narrative   Pt lives in 1 story home with her daughter, [name] and [name]'s husband   Has 2 adult daughters   Highest level of education: some college   Worked mainly as Web designer.   Social Determinants of Health   Financial Resource Strain: Not on file  Food Insecurity: Not on file  Transportation Needs:  Not on file  Physical Activity: Not on file  Stress: Not on file  Social Connections: Not on file  Intimate Partner Violence: Not on file    Family History  Problem Relation Age of Onset   Diabetes Daughter    Hypertension Daughter    Fibromyalgia Daughter    GER disease Daughter    Fibromyalgia Daughter    Crohn's disease Daughter    Asthma Daughter    Hypertension Mother    Breast cancer Other    Breast cancer Niece    Uterine cancer Niece      Current Outpatient Medications:    Accu-Chek FastClix Lancets MISC, USE AS INSTRUCTED TO TEST BLOOD SUGAR DAILY, Disp: 102 each, Rfl: 1   ACCU-CHEK GUIDE test strip, USE AS INSTRUCTED TO TEST BLOOD SUGAR DAILY, Disp: 100 strip, Rfl: 1   aspirin EC 325 MG tablet, Take 325 mg by mouth daily., Disp: , Rfl:  blood glucose meter kit and supplies, Dispense based on patient and insurance preference. Use up to 2 times daily as directed. (FOR ICD-10 E10.9, E11.9)., Disp: 1 each, Rfl: 0   BOTOX 200 units SOLR, Inject 1 vial into the muscle every 3 (three) months., Disp: , Rfl:    Cranberry 1000 MG CAPS, Take 1 capsule by mouth 2 (two) times daily., Disp: , Rfl:    donepezil (ARICEPT) 10 MG tablet, Take 1 tablet (10 mg total) by mouth at bedtime., Disp: 90 tablet, Rfl: 3   dorzolamide-timolol (COSOPT) 22.3-6.8 MG/ML ophthalmic solution, Place 1 drop into both eyes 2 (two) times daily., Disp: , Rfl:    DULoxetine (CYMBALTA) 60 MG capsule, TAKE 1 CAPSULE EVERY DAY  FOR  DEPRESSION (Patient taking differently: Take 60 mg by mouth daily.), Disp: 90 capsule, Rfl: 1   fluticasone (FLONASE) 50 MCG/ACT nasal spray, Place 2 sprays into both nostrils daily as needed for allergies or rhinitis., Disp: 48 g, Rfl: 0   gabapentin (NEURONTIN) 800 MG tablet, TAKE 1 TABLET BY MOUTH 3 TIMES A DAY FOR NEUROPATHY, Disp: 270 tablet, Rfl: 1   latanoprost (XALATAN) 0.005 % ophthalmic solution, Place 1 drop into both eyes at bedtime., Disp: , Rfl:    levothyroxine  (SYNTHROID) 112 MCG tablet, TAKE 1 TABLET EVERY MORNING ON AN EMPTY STOMACH WITH WATER ONLY.  NO FOOD OR OTHER MEDICATIONS FOR 30 MINUTES. (Patient taking differently: TAKE 1 TABLET EVERY MORNING ON AN EMPTY STOMACH WITH WATER ONLY.  NO FOOD OR OTHER MEDICATIONS FOR 30 MINUTES.), Disp: 90 tablet, Rfl: 1   lidocaine-prilocaine (EMLA) cream, Apply to affected area once, Disp: 30 g, Rfl: 3   meclizine (ANTIVERT) 25 MG tablet, TAKE 1 TABLET (25 MG TOTAL) BY MOUTH 2 (TWO) TIMES DAILY AS NEEDED FOR DIZZINESS., Disp: 180 tablet, Rfl: 0   meloxicam (MOBIC) 15 MG tablet, TAKE 1 TABLET DAILY AS NEEDED FOR PAIN., Disp: 90 tablet, Rfl: 0   Netarsudil Dimesylate (RHOPRESSA) 0.02 % SOLN, Place 1 drop into both eyes daily at 6 (six) AM., Disp: , Rfl:    omeprazole (PRILOSEC) 20 MG capsule, TAKE 1 CAPSULE TWICE DAILY FOR HEARTBURN., Disp: 180 capsule, Rfl: 1   ondansetron (ZOFRAN) 8 MG tablet, Take 1 tablet (8 mg total) by mouth 2 (two) times daily as needed for refractory nausea / vomiting. Start on day 3 after carboplatin chemo., Disp: 30 tablet, Rfl: 1   oxyCODONE-acetaminophen (PERCOCET/ROXICET) 5-325 MG tablet, Take 1-2 tablets by mouth every 4 (four) hours as needed for moderate pain., Disp: 30 tablet, Rfl: 0   pilocarpine (PILOCAR) 2 % ophthalmic solution, Place 1 drop into both eyes 4 (four) times daily. , Disp: , Rfl:    propranolol (INDERAL) 80 MG tablet, TAKE 1 TABLET ONE TIME DAILY FOR BLOOD PRESSURE (Patient taking differently: Take 80 mg by mouth daily.), Disp: 90 tablet, Rfl: 1   rosuvastatin (CRESTOR) 10 MG tablet, TAKE 1 TABLET DAILY FOR CHOLESTEROL. (Patient taking differently: Take 10 mg by mouth every evening.), Disp: 90 tablet, Rfl: 1   traZODone (DESYREL) 100 MG tablet, Take 2 tablets (200 mg total) by mouth at bedtime as needed for sleep., Disp: 180 tablet, Rfl: 1  Physical exam:  Vitals:   11/24/20 0913  BP: (!) 145/67  Pulse: 66  Resp: 20  Temp: 98.3 F (36.8 C)  SpO2: 97%  Weight: 154  lb 14.4 oz (70.3 kg)   Physical Exam Constitutional:      Comments: Sitting in a wheelchair and appears  in no acute distress  Cardiovascular:     Rate and Rhythm: Normal rate and regular rhythm.     Heart sounds: Normal heart sounds.  Pulmonary:     Effort: Pulmonary effort is normal.     Breath sounds: Normal breath sounds.  Abdominal:     General: Bowel sounds are normal.     Palpations: Abdomen is soft.  Skin:    General: Skin is warm and dry.  Neurological:     Mental Status: She is alert and oriented to person, place, and time.     CMP Latest Ref Rng & Units 11/24/2020  Glucose 70 - 99 mg/dL 170(H)  BUN 8 - 23 mg/dL 12  Creatinine 0.44 - 1.00 mg/dL 0.93  Sodium 135 - 145 mmol/L 140  Potassium 3.5 - 5.1 mmol/L 3.6  Chloride 98 - 111 mmol/L 107  CO2 22 - 32 mmol/L 21(L)  Calcium 8.9 - 10.3 mg/dL 8.5(L)  Total Protein 6.5 - 8.1 g/dL 6.9  Total Bilirubin 0.3 - 1.2 mg/dL 0.7  Alkaline Phos 38 - 126 U/L 80  AST 15 - 41 U/L 22  ALT 0 - 44 U/L 11   CBC Latest Ref Rng & Units 11/24/2020  WBC 4.0 - 10.5 K/uL 9.8  Hemoglobin 12.0 - 15.0 g/dL 10.3(L)  Hematocrit 36.0 - 46.0 % 33.1(L)  Platelets 150 - 400 K/uL 296    No images are attached to the encounter.  ECHOCARDIOGRAM COMPLETE  Result Date: 11/17/2020    ECHOCARDIOGRAM REPORT   Patient Name:   MIYANNA WIERSMA Date of Exam: 11/17/2020 Medical Rec #:  [id number]       Height:       64.0 in Accession #:    [id number]      Weight:       154.9 lb Date of Birth:  Apr 23, 1935      BSA:          1.755 m Patient Age:    62 years        BP:           158/79 mmHg Patient Gender: F               HR:           63 bpm. Exam Location:  [facility] Procedure: 2D Echo, Color Doppler, Cardiac Doppler and Strain Analysis Indications:     Z09 Chemotherapy  History:         Patient has prior history of Echocardiogram examinations. Risk                  Factors:Hypertension, Diabetes and Sleep Apnea. Bilaterial                  ovarian cancer w/ prior  history of breast cancer.  Sonographer:     Charmayne Sheer RDCS (AE) Referring Phys:  [id number] Weston Anna  Diagnosing Phys: Kate Sable MD  Sonographer Comments: Global longitudinal strain was attempted. IMPRESSIONS  1. Left ventricular ejection fraction, by estimation, is 60 to 65%. The left ventricle has normal function. The left ventricle has no regional wall motion abnormalities. Left ventricular diastolic parameters are indeterminate. The average left ventricular global longitudinal strain is -15.7 %.  2. Right ventricular systolic function is normal. The right ventricular size is normal.  3. The mitral valve is normal in structure. Mild mitral valve regurgitation.  4. The aortic valve is tricuspid. Aortic valve regurgitation is not visualized. FINDINGS  Left Ventricle: Left ventricular ejection  fraction, by estimation, is 60 to 65%. The left ventricle has normal function. The left ventricle has no regional wall motion abnormalities. The average left ventricular global longitudinal strain is -15.7 %. The left ventricular internal cavity size was normal in size. There is no left ventricular hypertrophy. Left ventricular diastolic parameters are indeterminate. Right Ventricle: The right ventricular size is normal. No increase in right ventricular wall thickness. Right ventricular systolic function is normal. Left Atrium: Left atrial size was normal in size. Right Atrium: Right atrial size was normal in size. Pericardium: There is no evidence of pericardial effusion. Mitral Valve: The mitral valve is normal in structure. Mild mitral valve regurgitation. MV peak gradient, 5.2 mmHg. The mean mitral valve gradient is 2.0 mmHg. Tricuspid Valve: The tricuspid valve is normal in structure. Tricuspid valve regurgitation is mild. Aortic Valve: The aortic valve is tricuspid. Aortic valve regurgitation is not visualized. Aortic valve mean gradient measures 5.0 mmHg. Aortic valve peak gradient measures 8.8 mmHg. Aortic  valve area, by VTI measures 1.65 cm. Pulmonic Valve: The pulmonic valve was not well visualized. Pulmonic valve regurgitation is trivial. Aorta: The aortic root is normal in size and structure. IAS/Shunts: No atrial level shunt detected by color flow Doppler.  LEFT VENTRICLE PLAX 2D LVIDd:         4.01 cm  Diastology LVIDs:         2.25 cm  LV e' medial:    4.90 cm/s LV PW:         1.16 cm  LV E/e' medial:  19.7 LV IVS:        0.85 cm  LV e' lateral:   8.27 cm/s LVOT diam:     1.70 cm  LV E/e' lateral: 11.7 LV SV:         52 LV SV Index:   30       2D Longitudinal Strain LVOT Area:     2.27 cm 2D Strain GLS Avg:     -15.7 %  RIGHT VENTRICLE RV Basal diam:  2.17 cm LEFT ATRIUM             Index       RIGHT ATRIUM           Index LA diam:        4.10 cm 2.34 cm/m  RA Area:     12.80 cm LA Vol (A2C):   32.9 ml 18.75 ml/m RA Volume:   27.70 ml  15.78 ml/m LA Vol (A4C):   34.3 ml 19.54 ml/m LA Biplane Vol: 34.0 ml 19.37 ml/m  AORTIC VALVE                    PULMONIC VALVE AV Area (Vmax):    1.59 cm     PV Vmax:       1.46 m/s AV Area (Vmean):   1.49 cm     PV Vmean:      90.100 cm/s AV Area (VTI):     1.65 cm     PV VTI:        0.309 m AV Vmax:           148.00 cm/s  PV Peak grad:  8.5 mmHg AV Vmean:          105.000 cm/s PV Mean grad:  4.0 mmHg AV VTI:            0.316 m AV Peak Grad:      8.8 mmHg AV Mean Grad:  5.0 mmHg LVOT Vmax:         104.00 cm/s LVOT Vmean:        69.000 cm/s LVOT VTI:          0.230 m LVOT/AV VTI ratio: 0.73  AORTA Ao Root diam: 2.60 cm MITRAL VALVE MV Area (PHT): 3.55 cm    SHUNTS MV Area VTI:   1.48 cm    Systemic VTI:  0.23 m MV Peak grad:  5.2 mmHg    Systemic Diam: 1.70 cm MV Mean grad:  2.0 mmHg MV Vmax:       1.14 m/s MV Vmean:      70.5 cm/s MV Decel Time: 214 msec MV E velocity: 96.40 cm/s MV A velocity: 91.70 cm/s MV E/A ratio:  1.05 Kate Sable MD Electronically signed by Kate Sable MD Signature Date/Time: 11/17/2020/2:42:44 PM    Final      Assessment  and plan- Patient is a 86 y.o. female with high-grade serous carcinoma of the ovary FIGO stage I cpT1 cpNX cMX s/p bilateral salpingo-oophorectomy.  She is s/p 4 cycles of adjuvant carbo Doxil chemotherapy and here for on treatment assessment prior to cycle 5  Patient and her daughter are concerned that she has slowly declined since the start of chemotherapy.  She has more fatigue and tends to spend more time in bed.  There are times when she is confused as well.  In order to prevent any further decline in her quality of life they are leaning towards stopping all chemotherapy at this time which would be reasonable given her age.So far her blood work looks fairly stable from chemotherapy CA125 from today is pending.  Her genetic testing was negative and there would be no role for any maintenance PARP inhibitor for stage I disease.  With regards to ovarian cancer surveillance in the future-she will need periodic pelvic exams by GYN oncology.  She is seeing them next week.  I will tentatively see her back in 3 months with CBC with differential CMP and CA125.  We will leave the port in place for now.  She will receive 1 L of IV fluids today  Peripheral neuropathy: She has this at baseline even prior to starting chemotherapy and is overall stable.  Continue to monitor   Visit Diagnosis 1. Ovarian cancer, bilateral      Dr. Randa Evens, MD, MPH [facility] [phone] 11/24/2020 11:45 AM

## 2020-11-24 NOTE — Progress Notes (Signed)
Received IV hydration with 1 liter of NS plus 10 mg dexamethasone. VSS. Discharged to home.

## 2020-11-25 ENCOUNTER — Ambulatory Visit: Payer: Medicare HMO

## 2020-11-25 LAB — CA 125: Cancer Antigen (CA) 125: 20.3 U/mL (ref 0.0–38.1)

## 2020-12-02 ENCOUNTER — Inpatient Hospital Stay: Payer: Medicare HMO

## 2020-12-03 ENCOUNTER — Other Ambulatory Visit: Payer: Self-pay | Admitting: Primary Care

## 2020-12-03 DIAGNOSIS — F330 Major depressive disorder, recurrent, mild: Secondary | ICD-10-CM

## 2020-12-03 DIAGNOSIS — E785 Hyperlipidemia, unspecified: Secondary | ICD-10-CM

## 2020-12-03 DIAGNOSIS — R519 Headache, unspecified: Secondary | ICD-10-CM

## 2020-12-03 DIAGNOSIS — G8929 Other chronic pain: Secondary | ICD-10-CM

## 2020-12-08 ENCOUNTER — Telehealth: Payer: Self-pay | Admitting: Obstetrics and Gynecology

## 2020-12-08 NOTE — Telephone Encounter (Signed)
Patient's daughter called to cancel her appt with Dr. Fransisca Connors on 8/11. She stated that she's been sick and she will call back

## 2020-12-09 ENCOUNTER — Inpatient Hospital Stay: Payer: Medicare HMO

## 2020-12-17 ENCOUNTER — Encounter: Payer: Self-pay | Admitting: Family Medicine

## 2020-12-17 ENCOUNTER — Ambulatory Visit (INDEPENDENT_AMBULATORY_CARE_PROVIDER_SITE_OTHER): Payer: Medicare HMO | Admitting: Family Medicine

## 2020-12-17 VITALS — BP 122/82 | HR 102 | Temp 99.1°F | Ht 64.0 in

## 2020-12-17 DIAGNOSIS — U071 COVID-19: Secondary | ICD-10-CM | POA: Diagnosis not present

## 2020-12-17 DIAGNOSIS — D849 Immunodeficiency, unspecified: Secondary | ICD-10-CM | POA: Diagnosis not present

## 2020-12-17 HISTORY — DX: COVID-19: U07.1

## 2020-12-17 MED ORDER — MOLNUPIRAVIR EUA 200MG CAPSULE: 4.0000 | ORAL_CAPSULE | Freq: Two times a day (BID) | ORAL | 0 refills | Status: AC

## 2020-12-17 NOTE — Telephone Encounter (Signed)
Access Nurse called back  Pt declined to go to UC/ED.   Access Nurse recommend her be seen an have Paxlovid and something for her cough called in made them aware no appts available

## 2020-12-17 NOTE — Telephone Encounter (Signed)
I spoke with Kims pts daughter and pt was on speaker phone also(DPR signed) pt started with symptoms on 12/15/20 and tested covid + with home test on 12/17/20. Pt had dry cough but now prod cough with green phlegm; slight wheezing at times, no SOB and no vomiting or diarrhea. Not S/T or runny nose. Pts T is 99.3. pt is tired with H/A pain level 5 and pt does have some sinus congestion. Pt does not want to go to UC and pt was scheduled with Dr Diona Browner today at 2:20 with video visit; vital signs will be ready. Self quarantine, drink plenty of fluids, rest, and take Tylenol for fever. UC & ED precautions given and pt and pts daughter  voiced understanding.sending note to Dr Dionicio Stall CMA and Gentry Fitz NP as PCP.

## 2020-12-17 NOTE — Assessment & Plan Note (Signed)
COVID19  infection. No clear sign of bacterial infection at this time. Mild to moderate symptoms on day 2of illness. No SOB.  No red flags/need for ER visit or in-person exam at respiratory clinic at this time..but have a low threshold given immunocompromised state given recent chemo for ovarian cancer and  Age.    Pt high risk for COVID complications given   immunocompromised state,HTN,diabetes and age.  Discussed options to treat COVID including  Antivirals and MAB infusion.  She is not interested in MAB infusion given she does not want to go to infusion center. Daughter agrees with plan  GFR >60  Reviewed interactions of paxlovid and her medicaitons with patient  In detail..  She is not a candidate due to medication interactions. We will instead use molnupiravir. Pt and daughter agreeable. If SOB begins symptoms worsening.. have low threshold for in-person exam, if severe shortness of breath ER visit recommended.   Can monitor Oxygen saturation at home with home monitor if able to obtain.  Go to ER if O2 sat < 90% on room air.  Reviewed home care and provided information through [place].  Recommended quarantine until test returns. If returns positive 5 days isolation recommended. Return to public day 6 and wear mask for 4 more days to complete 10 days. Provided info about prevention of spread of COVID 19.

## 2020-12-17 NOTE — Progress Notes (Signed)
VIRTUAL VISIT Due to national recommendations of social distancing due to [place] 19, a virtual visit is felt to be most appropriate for this patient at this time.   I connected with the patient on 12/17/20 at  2:20 PM EDT by virtual telehealth platform and verified that I am speaking with the correct person using two identifiers.   I discussed the limitations, risks, security and privacy concerns of performing an evaluation and management service by  virtual telehealth platform and the availability of in person appointments. I also discussed with the patient that there may be a patient responsible charge related to this service. The patient expressed understanding and agreed to proceed.  Patient location: Home Provider Location: [facility] [place] [facility] Participants: Eliezer Lofts and Ovid Curd   Chief Complaint  Patient presents with   Covid Positive    Positive Home Test today   Cough    Productive with green phelgm   Fatigue   Headache    History of Present Illness:  86 year old triple vaccinated female pt of Allie Bossier with history of dementia, ovarian cancer,TIA, HTN and DM presents with new onset productive cough, headache and fatigue x 2 days.   Low grade temp 99.3 No SOB. No wheeze.  Daughter is trying to push fluids.  No D/V.   BP Readings from Last 3 Encounters:  12/17/20 122/82  11/24/20 (!) 146/71  11/24/20 (!) 145/67     She was on chemo for ovarian cancer.. stopped given fatigue in 10/2020   Has not taken inderal    Daughter in a nurse.. lungs are clear.   Using mucinex  for cough.   COVID 19 screen COVID testing:positive home test 12/17/2020 COVID vaccine: 07/03/2020 , 06/10/2019 , 05/21/2019 COVID exposure: Son in Sports coach and daughter positive.  The importance of social distancing was discussed today.    Review of Systems  Constitutional:  Positive for fever and malaise/fatigue. Negative for chills.  HENT:  Positive for congestion. Negative for ear pain.    Eyes:  Negative for pain and redness.  Respiratory:  Positive for cough. Negative for shortness of breath.   Cardiovascular:  Negative for chest pain, palpitations and leg swelling.  Gastrointestinal:  Negative for abdominal pain, blood in stool, constipation, diarrhea, nausea and vomiting.  Genitourinary:  Negative for dysuria.  Musculoskeletal:  Negative for falls and myalgias.  Skin:  Negative for rash.  Neurological:  Negative for dizziness.  Psychiatric/Behavioral:  Negative for depression. The patient is not nervous/anxious.      Past Medical History:  Diagnosis Date   Acute tubular injury of transplanted kidney ([place]) 06/14/2016   AKI (acute kidney injury) ([place]) 06/14/2016   Asthma    Breast cancer ([place]) 2015   left   Bronchitis    CAP (community acquired pneumonia) 04/29/2012   Chronic sinusitis    Confusion 12/06/2016   Diabetes mellitus without complication (HCC)    Family history of breast cancer    GERD (gastroesophageal reflux disease)    History of ankle fracture 05/14/2018   Last Assessment & Plan:  With a recent fall. Right medial malleous. Has ortho follow up soon, splinted now and tylenol helps her pain   History of recurrent UTIs    Hypertension    Hypokalemia 04/26/2012   Hyponatremia    Hypothyroidism    Insomnia    Migraine    Neuropathic pain    Ovarian cancer ([place]) 2022   Personal history of breast cancer    Personal  history of chemotherapy current   bilateral ovarian ca   Pneumonia    Rheumatoid arthritis ([place])    Sepsis ([place]) 04/26/2012   Sleep apnea    Stroke [facility])    seen in CT scan   Thyroid disease     reports that she has never smoked. She has never used smokeless tobacco. She reports that she does not drink alcohol and does not use drugs.   Current Outpatient Medications:    Accu-Chek FastClix Lancets MISC, USE AS INSTRUCTED TO TEST BLOOD SUGAR DAILY, Disp: 102 each, Rfl: 1   ACCU-CHEK GUIDE test strip, USE AS INSTRUCTED TO TEST BLOOD  SUGAR DAILY, Disp: 100 strip, Rfl: 1   aspirin EC 325 MG tablet, Take 325 mg by mouth daily., Disp: , Rfl:    blood glucose meter kit and supplies, Dispense based on patient and insurance preference. Use up to 2 times daily as directed. (FOR ICD-10 E10.9, E11.9)., Disp: 1 each, Rfl: 0   BOTOX 200 units SOLR, Inject 1 vial into the muscle every 3 (three) months., Disp: , Rfl:    Cranberry 1000 MG CAPS, Take 1 capsule by mouth 2 (two) times daily., Disp: , Rfl:    donepezil (ARICEPT) 10 MG tablet, Take 1 tablet (10 mg total) by mouth at bedtime., Disp: 90 tablet, Rfl: 3   dorzolamide-timolol (COSOPT) 22.3-6.8 MG/ML ophthalmic solution, Place 1 drop into both eyes 2 (two) times daily., Disp: , Rfl:    DULoxetine (CYMBALTA) 60 MG capsule, TAKE 1 CAPSULE EVERY DAY  FOR  DEPRESSION, Disp: 90 capsule, Rfl: 1   fluticasone (FLONASE) 50 MCG/ACT nasal spray, Place 2 sprays into both nostrils daily as needed for allergies or rhinitis., Disp: 48 g, Rfl: 0   gabapentin (NEURONTIN) 800 MG tablet, TAKE 1 TABLET BY MOUTH 3 TIMES A DAY FOR NEUROPATHY, Disp: 270 tablet, Rfl: 1   latanoprost (XALATAN) 0.005 % ophthalmic solution, Place 1 drop into both eyes at bedtime., Disp: , Rfl:    levothyroxine (SYNTHROID) 112 MCG tablet, TAKE 1 TABLET EVERY MORNING ON AN EMPTY STOMACH WITH WATER ONLY.  NO FOOD OR OTHER MEDICATIONS FOR 30 MINUTES. (Patient taking differently: TAKE 1 TABLET EVERY MORNING ON AN EMPTY STOMACH WITH WATER ONLY.  NO FOOD OR OTHER MEDICATIONS FOR 30 MINUTES.), Disp: 90 tablet, Rfl: 1   lidocaine-prilocaine (EMLA) cream, Apply to affected area once, Disp: 30 g, Rfl: 3   meclizine (ANTIVERT) 25 MG tablet, TAKE 1 TABLET (25 MG TOTAL) BY MOUTH 2 (TWO) TIMES DAILY AS NEEDED FOR DIZZINESS., Disp: 180 tablet, Rfl: 0   meloxicam (MOBIC) 15 MG tablet, TAKE 1 TABLET DAILY AS NEEDED FOR PAIN., Disp: 90 tablet, Rfl: 0   Netarsudil Dimesylate (RHOPRESSA) 0.02 % SOLN, Place 1 drop into both eyes daily at 6 (six) AM.,  Disp: , Rfl:    omeprazole (PRILOSEC) 20 MG capsule, TAKE 1 CAPSULE TWICE DAILY FOR HEARTBURN., Disp: 180 capsule, Rfl: 1   ondansetron (ZOFRAN) 8 MG tablet, Take 1 tablet (8 mg total) by mouth 2 (two) times daily as needed for refractory nausea / vomiting. Start on day 3 after carboplatin chemo., Disp: 30 tablet, Rfl: 1   oxyCODONE-acetaminophen (PERCOCET/ROXICET) 5-325 MG tablet, Take 1-2 tablets by mouth every 4 (four) hours as needed for moderate pain., Disp: 30 tablet, Rfl: 0   pilocarpine (PILOCAR) 2 % ophthalmic solution, Place 1 drop into both eyes 4 (four) times daily. , Disp: , Rfl:    propranolol (INDERAL) 80 MG tablet, Take 1 tablet (  80 mg total) by mouth daily. For headache prevention., Disp: 90 tablet, Rfl: 1   rosuvastatin (CRESTOR) 10 MG tablet, TAKE 1 TABLET DAILY FOR CHOLESTEROL., Disp: 90 tablet, Rfl: 1   traZODone (DESYREL) 100 MG tablet, Take 2 tablets (200 mg total) by mouth at bedtime as needed for sleep., Disp: 180 tablet, Rfl: 1   Observations/Objective: Blood pressure 122/82, pulse (!) 102, temperature 99.1 F (37.3 C), temperature source Temporal, height 5' 4"  (1.626 m).  Physical Exam  Physical Exam Constitutional:      General: The patient is not in acute distress. Able to speak in complete sentence and answer questions. Pulmonary:     Effort: Pulmonary effort is normal. No respiratory distress.  Neurological:     Mental Status: The patient is alert and oriented to person, place, and time.  Psychiatric:        Mood and Affect: Mood normal.        Behavior: Behavior normal.   Assessment and Plan Problem List Items Addressed This Visit     COVID-19 virus infection - Primary    COVID19  infection. No clear sign of bacterial infection at this time. Mild to moderate symptoms on day 2of illness. No SOB.  No red flags/need for ER visit or in-person exam at respiratory clinic at this time..but have a low threshold given immunocompromised state given recent chemo  for ovarian cancer and  Age.    Pt high risk for COVID complications given   immunocompromised state,HTN,diabetes and age.  Discussed options to treat COVID including  Antivirals and MAB infusion.  She is not interested in MAB infusion given she does not want to go to infusion center. Daughter agrees with plan  GFR >60  Reviewed interactions of paxlovid and her medicaitons with patient  In detail..  She is not a candidate due to medication interactions. We will instead use molnupiravir. Pt and daughter agreeable. If SOB begins symptoms worsening.. have low threshold for in-person exam, if severe shortness of breath ER visit recommended.   Can monitor Oxygen saturation at home with home monitor if able to obtain.  Go to ER if O2 sat < 90% on room air.  Reviewed home care and provided information through [facility].  Recommended quarantine until test returns. If returns positive 5 days isolation recommended. Return to public day 6 and wear mask for 4 more days to complete 10 days. Provided info about prevention of spread of COVID 19.      Relevant Medications   molnupiravir EUA 200 mg CAPS   Immunocompromised ([place])   Given lower blood pressure and higher pulse.. some concern for dehydration.. increase fluid intake and follow closely.   I discussed the assessment and treatment plan with the patient. The patient was provided an opportunity to ask questions and all were answered. The patient agreed with the plan and demonstrated an understanding of the instructions.   The patient was advised to call back or seek an in-person evaluation if the symptoms worsen or if the condition fails to improve as anticipated.     Eliezer Lofts, MD

## 2020-12-17 NOTE — Telephone Encounter (Signed)
[facility] - Client TELEPHONE ADVICE RECORD [facility] Patient Name: Shannon Higgins Gender: Female DOB: 1934/11/15 Age: 86 Y 37 M 22 D Return Phone Number: [phone] (Primary) Address: City/ State/ Zip: [place] [address] Client [facility] - Client Client Site [place] - Day Physician Alma Friendly - NP Contact Type Call Who Is Calling Patient / Member / Family / Caregiver Call Type Triage / Clinical Caller Name [name]- patients daughter Relationship To Patient Self Return Phone Number [phone] (Primary) Chief Complaint Fever (non-urgent symptom) (greater than THREE MONTHS old) Reason for Call Symptomatic / Request for [place] states she has covid experiencing a cough fatigue and fever. Translation No Nurse Assessment Nurse: Ronnald Ramp, RN, Miranda Date/Time (Eastern Time): 12/17/2020 12:46:10 PM Confirm and document reason for call. If symptomatic, describe symptoms. ---Caller states her mother has cough, fatigue, and fever. Temp 99.3. She has tested positive for COVID on a home test. Does the patient have any new or worsening symptoms? ---Yes Will a triage be completed? ---Yes Related visit to physician within the last 2 weeks? ---No Does the PT have any chronic conditions? (i.e. diabetes, asthma, this includes High risk factors for pregnancy, etc.) ---Yes List chronic conditions. ---Diabetes, HTN, Migraines, Hx of strokes, Neuropathy Is this a behavioral health or substance abuse call? ---No Guidelines Guideline Title Affirmed Question Affirmed Notes Nurse Date/Time (Eastern Time) COVID-19 - Diagnosed or Suspected [1] HIGH RISK for severe COVID complications (e.g., weak immune system, age > 5 years, obesity with BMI > 25, pregnant, chronic Ronnald Ramp, RN, Miranda 12/17/2020 12:47:26 PM PLEASE NOTE: All  timestamps contained within this report are represented as [place] Standard Time. CONFIDENTIALTY NOTICE: This fax transmission is intended only for the addressee. It contains information that is legally privileged, confidential or otherwise protected from use or disclosure. If you are not the intended recipient, you are strictly prohibited from reviewing, disclosing, copying using or disseminating any of this information or taking any action in reliance on or regarding this information. If you have received this fax in error, please notify us immediately by telephone so that we can arrange for its return to [place]. Phone: [phone], Toll-Free: [phone], Fax: [phone] Page: 2 of 3 Call Id: [id number] Guidelines Guideline Title Affirmed Question Affirmed Notes Nurse Date/Time Eilene Ghazi Time) lung disease or other chronic medical condition) AND [2] COVID symptoms (e.g., cough, fever) (Exceptions: Already seen by PCP and no new or worsening symptoms.) Disp. Time Eilene Ghazi Time) Disposition Final User 12/17/2020 12:53:25 PM Call PCP Now Yes Ronnald Ramp, RN, Miranda Caller Disagree/Comply Comply Caller Understands Yes PreDisposition Call Doctor Care Advice Given Per Guideline CALL PCP NOW: * You need to discuss this with your doctor (or NP/PA). * I'll page the on-call provider now. If you haven't heard from the provider (or me) within 30 minutes, call again. GENERAL CARE ADVICE FOR COVID-19 SYMPTOMS: * The symptoms are generally treated the same whether you have COVID-19, influenza or some other respiratory virus. COUGH MEDICINES: * HOME REMEDY - HARD CANDY: Hard candy works just as well as over-the-counter cough drops. People who have diabetes should use sugar-free candy. FEVER MEDICINES: * For fevers above 101 F (38.3 C) take either acetaminophen or ibuprofen. * They are over-the-counter (OTC) drugs that help treat both fever and pain. You can buy them at the drugstore. CALL BACK IF: *  You become worse CARE ADVICE given per COVID-19 -  DIAGNOSED OR SUSPECTED (Adult) guideline. Comments User: Leverne Humbles, RN Date/Time Eilene Ghazi Time): 12/17/2020 12:51:54 PM [facility] [employer] User: Leverne Humbles, RN Date/Time Eilene Ghazi Time): 12/17/2020 12:53:24 PM They have already called the office and know there are no appts for today or tomorrow. Pt is refusing to go anywhere to be seen. She is wanting medications called in. User: Leverne Humbles, RN Date/Time (Eastern Time): 12/17/2020 1:00:04 PM Called backline and there are no appts to be seen. Gave message that she is refusing to go anywhere and that she is requesting meds to be called in. They will send a message to the covering provider. User: Leverne Humbles, RN Date/Time (Eastern Time): 12/17/2020 1:01:58 PM Spoke with caller and told that the information had been passed on to the office and they should be hearing back from the office directly. PLEASE NOTE: All timestamps contained within this report are represented as [place] Standard Time. CONFIDENTIALTY NOTICE: This fax transmission is intended only for the addressee. It contains information that is legally privileged, confidential or otherwise protected from use or disclosure. If you are not the intended recipient, you are strictly prohibited from reviewing, disclosing, copying using or disseminating any of this information or taking any action in reliance on or regarding this information. If you have received this fax in error, please notify us immediately by telephone so that we can arrange for its return to [place]. Phone: [phone], Toll-Free: [phone], Fax: [phone] Page: 3 of 3 Call Id:

## 2020-12-17 NOTE — Telephone Encounter (Signed)
Please see note below access nurse note where I spoke with pt and her daughter.

## 2020-12-18 ENCOUNTER — Ambulatory Visit: Payer: Medicare HMO | Admitting: Neurology

## 2020-12-25 IMAGING — CR ABDOMEN - 1 VIEW
1 series · 1 of 1 positions shown · non-contrast
Comparison: Renal ultrasound obtained today. Lumbar spine
radiographs dated 05/08/2018.

CLINICAL DATA: Recurrent urinary tract infections. History of
nephrolithiasis.

EXAM:
ABDOMEN - 1 VIEW

[dg abd 1 view]
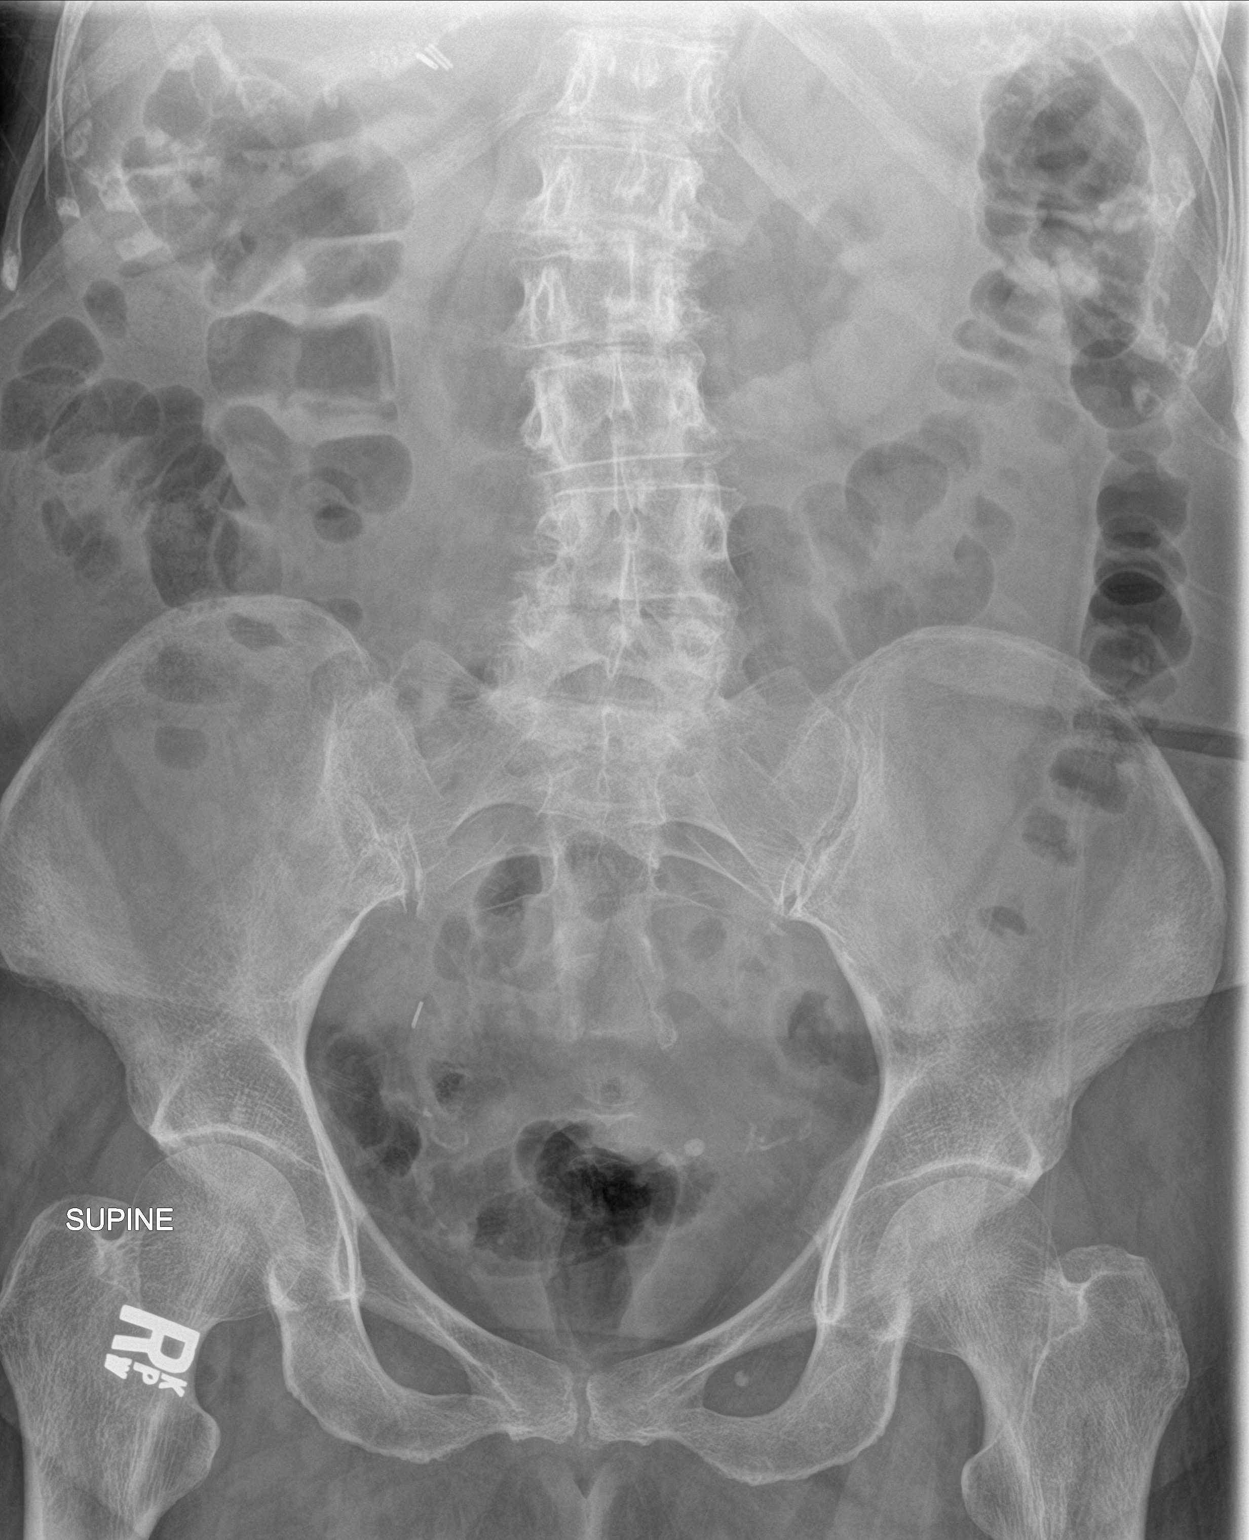

[1 of 1 positions shown; findings below may reference images not displayed]

FINDINGS: Normal bowel gas pattern. Cholecystectomy clips. Stable right pelvic
surgical clip. Stable thoracolumbar spine degenerative changes and
scoliosis.
IMPRESSION: No acute abnormality.

## 2020-12-29 ENCOUNTER — Ambulatory Visit: Payer: Medicare HMO

## 2020-12-29 ENCOUNTER — Other Ambulatory Visit: Payer: Self-pay

## 2020-12-29 ENCOUNTER — Ambulatory Visit
Admission: EM | Admit: 2020-12-29 | Discharge: 2020-12-29 | Disposition: A | Discharge status: Home / Self Care | Payer: Medicare HMO | Attending: Internal Medicine | Admitting: Internal Medicine

## 2020-12-29 ENCOUNTER — Encounter: Payer: Self-pay | Admitting: Emergency Medicine

## 2020-12-29 DIAGNOSIS — J208 Acute bronchitis due to other specified organisms: Secondary | ICD-10-CM

## 2020-12-29 MED ORDER — AZITHROMYCIN 250 MG PO TABS: 250.0000 mg | ORAL_TABLET | Freq: Every day | ORAL | 0 refills | Status: DC

## 2020-12-29 NOTE — ED Provider Notes (Signed)
[facility]    CSN: [id number] Arrival date & time: 12/29/20  1541      History   Chief Complaint Chief Complaint  Patient presents with   Appointment   Cough    HPI MERCIE BALSLEY is a 86 y.o. female unresolved cough since had covid 10 days ago and been having return of fever yesterday. Her cough is productive. Denies SOB or wheezing, but at times has had cough attacks.     Past Medical History:  Diagnosis Date   Acute tubular injury of transplanted kidney ([place]) 06/14/2016   AKI (acute kidney injury) ([place]) 06/14/2016   Asthma    Breast cancer ([place]) 2015   left   Bronchitis    CAP (community acquired pneumonia) 04/29/2012   Chronic sinusitis    Confusion 12/06/2016   Diabetes mellitus without complication (HCC)    Family history of breast cancer    GERD (gastroesophageal reflux disease)    History of ankle fracture 05/14/2018   Last Assessment & Plan:  With a recent fall. Right medial malleous. Has ortho follow up soon, splinted now and tylenol helps her pain   History of recurrent UTIs    Hypertension    Hypokalemia 04/26/2012   Hyponatremia    Hypothyroidism    Insomnia    Migraine    Neuropathic pain    Ovarian cancer ([place]) 2022   Personal history of breast cancer    Personal history of chemotherapy current   bilateral ovarian ca   Pneumonia    Rheumatoid arthritis ([place])    Sepsis ([place]) 04/26/2012   Sleep apnea    Stroke ([place])    seen in CT scan   Thyroid disease     Patient Active Problem List   Diagnosis Date Noted   COVID-19 virus infection 12/17/2020   Immunocompromised ([place]) 12/17/2020   Genetic testing 09/23/2020   Personal history of breast cancer    Family history of breast cancer    Ovarian cancer, bilateral 07/21/2020   S/P bilateral oophorectomy 07/21/2020   Ovarian mass 07/14/2020   Ovarian mass, left 80/22/3361   Acute metabolic encephalopathy 22/44/9753   Sepsis ([place]) 04/23/2020   Acute cystitis without hematuria  11/21/2019   Imbalance 09/02/2019   Preventative health care 03/04/2019   Insomnia 09/27/2018   History of TIA (transient ischemic attack) 09/13/2018   Recurrent UTI 06/20/2018   Frequent falls 05/14/2018   Gout 05/14/2018   Closed fracture of medial malleolus 05/11/2018   Type 2 diabetes mellitus with peripheral neuropathy ([place]) 05/11/2018   Type 2 diabetes mellitus ([place]) 05/11/2018   Orthostatic hypotension 05/08/2018   Urinary tract infectious disease    History of breast cancer 10/17/2017   Major depressive disorder 06/21/2017   Chronic low back pain 01/13/2017   Hyperlipidemia 12/06/2016   Glaucoma 12/06/2016   Neuropathic pain 12/06/2016   Malignant melanoma of skin ([place]) 12/06/2016   Dementia arising in the senium and presenium ([place]) 12/06/2016   Chronic headache disorder 12/06/2016   Transient hypotension 06/14/2016   Hypothyroidism 06/14/2016   Sepsis secondary to UTI ([place]) 04/26/2012   Anemia 04/26/2012   Hypertensive disorder 04/26/2012   Gastroesophageal reflux disease 04/26/2012   Obstructive sleep apnea syndrome 04/26/2012    Past Surgical History:  Procedure Laterality Date   ABDOMINAL HYSTERECTOMY     BACK SURGERY     BREAST BIOPSY Right ?`   benign   BREAST LUMPECTOMY Left 2015   breast ca   CHOLECYSTECTOMY  JOINT REPLACEMENT     LAPAROTOMY N/A 07/14/2020   Procedure: LAPAROTOMY;  Surgeon: Gae Dry, MD;  Location: [facility];  Service: Gynecology;  Laterality: N/A;   OOPHORECTOMY     OVARIAN CYST REMOVAL     PORTA CATH INSERTION N/A 07/27/2020   Procedure: PORTA CATH INSERTION;  Surgeon: Algernon Huxley, MD;  Location: [facility];  Service: Cardiovascular;  Laterality: N/A;   REPLACEMENT TOTAL KNEE Right    SALPINGOOPHORECTOMY Bilateral 07/14/2020   Procedure: OPEN SALPINGO OOPHORECTOMY;  Surgeon: Gae Dry, MD;  Location: [facility];  Service: Gynecology;  Laterality: Bilateral;   TOTAL SHOULDER REPLACEMENT Left     OB History      Gravida  2   Para  2   Term  2   Preterm      AB      Living  2      SAB      IAB      Ectopic      Multiple      Live Births               Home Medications    Prior to Admission medications   Medication Sig Start Date End Date Taking? Authorizing Provider  azithromycin (ZITHROMAX) 250 MG tablet Take 1 tablet (250 mg total) by mouth daily. Take first 2 tablets together, then 1 every day until finished. 12/29/20  Yes Rodriguez-Southworth, Sunday Spillers, PA-C  donepezil (ARICEPT) 10 MG tablet Take 1 tablet (10 mg total) by mouth at bedtime. 07/24/20  Yes Cameron Sprang, MD  dorzolamide-timolol (COSOPT) 22.3-6.8 MG/ML ophthalmic solution Place 1 drop into both eyes 2 (two) times daily.   Yes [provider]  DULoxetine (CYMBALTA) 60 MG capsule TAKE 1 CAPSULE EVERY DAY  FOR  DEPRESSION 12/03/20  Yes Pleas Koch, NP  fluticasone [facility]) 50 MCG/ACT nasal spray Place 2 sprays into both nostrils daily as needed for allergies or rhinitis. 10/14/20  Yes Pleas Koch, NP  gabapentin (NEURONTIN) 800 MG tablet TAKE 1 TABLET BY MOUTH 3 TIMES A DAY FOR NEUROPATHY 07/03/20  Yes Pleas Koch, NP  latanoprost (XALATAN) 0.005 % ophthalmic solution Place 1 drop into both eyes at bedtime.   Yes [provider]  levothyroxine (SYNTHROID) 112 MCG tablet TAKE 1 TABLET EVERY MORNING ON AN EMPTY STOMACH WITH WATER ONLY.  NO FOOD OR OTHER MEDICATIONS FOR 30 MINUTES. Patient taking differently: TAKE 1 TABLET EVERY MORNING ON AN EMPTY STOMACH WITH WATER ONLY.  NO FOOD OR OTHER MEDICATIONS FOR 30 MINUTES. 05/13/20  Yes Pleas Koch, NP  Netarsudil Dimesylate (RHOPRESSA) 0.02 % SOLN Place 1 drop into both eyes daily at 6 (six) AM. 12/04/18  Yes [provider]  pilocarpine (PILOCAR) 2 % ophthalmic solution Place 1 drop into both eyes 4 (four) times daily.  02/19/19  Yes [provider]  propranolol (INDERAL) 80 MG tablet Take 1 tablet (80 mg total) by  mouth daily. For headache prevention. 12/03/20  Yes Pleas Koch, NP  traZODone (DESYREL) 100 MG tablet Take 2 tablets (200 mg total) by mouth at bedtime as needed for sleep. 11/18/20  Yes Pleas Koch, NP  Accu-Chek FastClix Lancets MISC USE AS INSTRUCTED TO TEST BLOOD SUGAR DAILY 10/14/20   Pleas Koch, NP  ACCU-CHEK GUIDE test strip USE AS INSTRUCTED TO TEST BLOOD SUGAR DAILY 10/14/20   Pleas Koch, NP  aspirin EC 325 MG tablet Take 325 mg by mouth  daily.    [provider]  blood glucose meter kit and supplies Dispense based on patient and insurance preference. Use up to 2 times daily as directed. (FOR ICD-10 E10.9, E11.9). 03/04/19   Pleas Koch, NP  BOTOX 200 units SOLR Inject 1 vial into the muscle every 3 (three) months. 02/28/20   [provider]  Cranberry 1000 MG CAPS Take 1 capsule by mouth 2 (two) times daily.    [provider]  lidocaine-prilocaine (EMLA) cream Apply to affected area once 07/27/20   Sindy Guadeloupe, MD  meclizine (ANTIVERT) 25 MG tablet TAKE 1 TABLET (25 MG TOTAL) BY MOUTH 2 (TWO) TIMES DAILY AS NEEDED FOR DIZZINESS. 05/14/20   Pleas Koch, NP  meloxicam (MOBIC) 15 MG tablet TAKE 1 TABLET DAILY AS NEEDED FOR PAIN. 09/07/20   Pleas Koch, NP  omeprazole (PRILOSEC) 20 MG capsule TAKE 1 CAPSULE TWICE DAILY FOR HEARTBURN. 10/14/20   Pleas Koch, NP  ondansetron (ZOFRAN) 8 MG tablet Take 1 tablet (8 mg total) by mouth 2 (two) times daily as needed for refractory nausea / vomiting. Start on day 3 after carboplatin chemo. 07/27/20   Sindy Guadeloupe, MD  rosuvastatin (CRESTOR) 10 MG tablet TAKE 1 TABLET DAILY FOR CHOLESTEROL. 12/03/20   Pleas Koch, NP    Family History Family History  Problem Relation Age of Onset   Diabetes Daughter    Hypertension Daughter    Fibromyalgia Daughter    GER disease Daughter    Fibromyalgia Daughter    Crohn's disease Daughter    Asthma Daughter    Hypertension  Mother    Breast cancer Other    Breast cancer Niece    Uterine cancer Niece     Social History Social History   Tobacco Use   Smoking status: Never   Smokeless tobacco: Never  Vaping Use   Vaping Use: Never used  Substance Use Topics   Alcohol use: No   Drug use: No     Allergies   Patient has no known allergies.   Review of Systems Review of Systems + cough, fever, chills and feeling hot off and on. The rest is neg.   Physical Exam Triage Vital Signs ED Triage Vitals  Enc Vitals Group     BP 12/29/20 1601 124/79     Pulse Rate 12/29/20 1601 67     Resp 12/29/20 1601 18     Temp 12/29/20 1601 99.6 F (37.6 C)     Temp Source 12/29/20 1601 Rectal     SpO2 12/29/20 1601 93 %     Weight --      Height --      Head Circumference --      Peak Flow --      Pain Score 12/29/20 1556 0     Pain Loc --      Pain Edu? --      Excl. in [place]? --    No data found.  Updated Vital Signs BP 124/79 (BP Location: Left Arm)   Pulse 67   Temp 99.6 F (37.6 C) (Rectal)   Resp 18   SpO2 93%  I repeated her pulse ox without a mask, after coughin and taking a few deep breaths and was 98% Visual Acuity Right Eye Distance:   Left Eye Distance:   Bilateral Distance:    Right Eye Near:   Left Eye Near:    Bilateral Near:     Physical Exam  Physical Exam Vitals signs and nursing note reviewed.  Constitutional:      General: She is not in acute distress.    Appearance: Normal appearance. She is not ill-appearing, toxic-appearing or diaphoretic.  HENT:     Head: Normocephalic.     Right Ear: Tympanic membrane, ear canal and external ear normal.     Left Ear: Tympanic membrane, ear canal and external ear normal.     Nose: Nose normal.     Mouth/Throat:     Mouth: Mucous membranes are moist.  Eyes:     General: No scleral icterus.       Right eye: No discharge.        Left eye: No discharge.     Conjunctiva/sclera: Conjunctivae normal.  Neck:     Musculoskeletal:  Neck supple. No neck rigidity.  Cardiovascular:     Rate and Rhythm: Normal rate and regular rhythm.     Heart sounds: No murmur.  Pulmonary:     Effort: Pulmonary effort is normal.     Breath sounds: Normal breath sounds.  Abdominal:     General: Bowel sounds are normal. There is no distension.     Palpations: Abdomen is soft. There is no mass.     Tenderness: There is no abdominal tenderness. There is no guarding or rebound.     Hernia: No hernia is present.  Musculoskeletal: Normal range of motion.  Lymphadenopathy:     Cervical: No cervical adenopathy.  Skin:    General: Skin is warm and dry.     Coloration: Skin is not jaundiced.     Findings: No rash.  Neurological:     Mental Status: She is alert and oriented to person, place, and time.     Gait: Gait normal.  Psychiatric:        Mood and Affect: Mood normal.        Behavior: Behavior normal.        Thought Content: Thought content normal.        Judgment: Judgment normal.   UC Treatments / Results  Labs (all labs ordered are listed, but only abnormal results are displayed) Labs Reviewed - No data to display  EKG   Radiology No results found.  Procedures Procedures (including critical care time)  Medications Ordered in UC Medications - No data to display  Initial Impression / Assessment and Plan / UC Course  I have reviewed the triage vital signs and the nursing notes.   Final Clinical Impressions(s) / UC Diagnoses   Final diagnoses:  Acute bronchitis due to other specified organisms     Discharge Instructions      I believe you are getting bronchitis as a secondary infection from the virus. I will place you on Zpack. You should be getting better in 48-72h, but if you get worse, you need a chest xray which we dont have a technician today to do them, and your exam does not show signs of pneumonia.      ED Prescriptions     Medication Sig Dispense Auth. Provider   azithromycin (ZITHROMAX) 250 MG  tablet Take 1 tablet (250 mg total) by mouth daily. Take first 2 tablets together, then 1 every day until finished. 6 tablet Rodriguez-Southworth, Sunday Spillers, PA-C      PDMP not reviewed this encounter.   Shelby Mattocks, [place] 12/29/20 1648

## 2020-12-29 NOTE — ED Triage Notes (Signed)
Post covid issues.  Patient tested positive 10 days ago.  Patient had a low grade temp yesterday.  Patient continues to cough up phlegm

## 2020-12-29 NOTE — Discharge Instructions (Addendum)
I believe you are getting bronchitis as a secondary infection from the virus. I will place you on Zpack. You should be getting better in 48-72h, but if you get worse, you need a chest xray which we dont have a technician today to do them, and your exam does not show signs of pneumonia.

## 2021-01-05 ENCOUNTER — Ambulatory Visit: Payer: Medicare HMO | Admitting: Gastroenterology

## 2021-01-05 ENCOUNTER — Telehealth: Payer: Self-pay | Admitting: *Deleted

## 2021-01-05 NOTE — Telephone Encounter (Signed)
[facility]  [phone] to schedule delivery of Botox. It was on hold due to no response from patient. She re-started the request and we consented to receive the Botox so after patient is contacted it will be shipped.  Delivery is expected between 01/06/2021 - 01/13/2021 pending patient response.

## 2021-01-12 ENCOUNTER — Ambulatory Visit: Payer: Medicare HMO | Admitting: Gastroenterology

## 2021-01-12 DIAGNOSIS — H401133 Primary open-angle glaucoma, bilateral, severe stage: Secondary | ICD-10-CM | POA: Diagnosis not present

## 2021-01-14 ENCOUNTER — Ambulatory Visit: Payer: Medicare HMO | Admitting: Primary Care

## 2021-01-15 ENCOUNTER — Ambulatory Visit (INDEPENDENT_AMBULATORY_CARE_PROVIDER_SITE_OTHER): Payer: Medicare HMO | Admitting: Primary Care

## 2021-01-15 ENCOUNTER — Ambulatory Visit (INDEPENDENT_AMBULATORY_CARE_PROVIDER_SITE_OTHER): Payer: Medicare HMO | Admitting: Neurology

## 2021-01-15 ENCOUNTER — Encounter: Payer: Self-pay | Admitting: Primary Care

## 2021-01-15 ENCOUNTER — Other Ambulatory Visit: Payer: Self-pay

## 2021-01-15 VITALS — BP 110/70 | HR 72 | Temp 97.4°F | Ht 64.0 in | Wt 159.0 lb

## 2021-01-15 DIAGNOSIS — Z23 Encounter for immunization: Secondary | ICD-10-CM

## 2021-01-15 DIAGNOSIS — E785 Hyperlipidemia, unspecified: Secondary | ICD-10-CM

## 2021-01-15 DIAGNOSIS — E039 Hypothyroidism, unspecified: Secondary | ICD-10-CM

## 2021-01-15 DIAGNOSIS — R519 Headache, unspecified: Secondary | ICD-10-CM

## 2021-01-15 DIAGNOSIS — F0390 Unspecified dementia without behavioral disturbance: Secondary | ICD-10-CM | POA: Diagnosis not present

## 2021-01-15 DIAGNOSIS — F325 Major depressive disorder, single episode, in full remission: Secondary | ICD-10-CM

## 2021-01-15 DIAGNOSIS — M792 Neuralgia and neuritis, unspecified: Secondary | ICD-10-CM | POA: Diagnosis not present

## 2021-01-15 DIAGNOSIS — K219 Gastro-esophageal reflux disease without esophagitis: Secondary | ICD-10-CM | POA: Diagnosis not present

## 2021-01-15 DIAGNOSIS — M545 Low back pain, unspecified: Secondary | ICD-10-CM

## 2021-01-15 DIAGNOSIS — E119 Type 2 diabetes mellitus without complications: Secondary | ICD-10-CM | POA: Diagnosis not present

## 2021-01-15 DIAGNOSIS — C563 Malignant neoplasm of bilateral ovaries: Secondary | ICD-10-CM

## 2021-01-15 DIAGNOSIS — U071 COVID-19: Secondary | ICD-10-CM

## 2021-01-15 DIAGNOSIS — R5383 Other fatigue: Secondary | ICD-10-CM

## 2021-01-15 DIAGNOSIS — E1142 Type 2 diabetes mellitus with diabetic polyneuropathy: Secondary | ICD-10-CM

## 2021-01-15 DIAGNOSIS — G43709 Chronic migraine without aura, not intractable, without status migrainosus: Secondary | ICD-10-CM | POA: Diagnosis not present

## 2021-01-15 DIAGNOSIS — G8929 Other chronic pain: Secondary | ICD-10-CM

## 2021-01-15 DIAGNOSIS — R296 Repeated falls: Secondary | ICD-10-CM

## 2021-01-15 DIAGNOSIS — G4700 Insomnia, unspecified: Secondary | ICD-10-CM

## 2021-01-15 LAB — LIPID PANEL
Cholesterol: 182 mg/dL (ref 0–200)
HDL: 60.6 mg/dL (ref >39.00)
NonHDL: 121.84
Total CHOL/HDL Ratio: 3
Triglycerides: 225 mg/dL — ABNORMAL HIGH (ref 0.0–149.0)
VLDL: 45 mg/dL — ABNORMAL HIGH (ref 0.0–40.0)

## 2021-01-15 LAB — TSH: TSH: 0.23 u[IU]/mL — ABNORMAL LOW (ref 0.35–5.50)

## 2021-01-15 LAB — LDL CHOLESTEROL, DIRECT: Direct LDL: 99 mg/dL

## 2021-01-15 LAB — HEMOGLOBIN A1C: Hgb A1c MFr Bld: 6.3 % (ref 4.6–6.5)

## 2021-01-15 MED ORDER — DULOXETINE HCL 30 MG PO CPEP
30.0000 mg | ORAL_CAPSULE | Freq: Every day | ORAL | 3 refills | Status: DC
Start: 2021-01-15 — End: 2022-01-27

## 2021-01-15 MED ORDER — ONABOTULINUMTOXINA 100 UNITS IJ SOLR
200.0000 [IU] | Freq: Once | INTRAMUSCULAR | Status: AC
  Administered 2021-01-15: 155 [IU] via INTRAMUSCULAR

## 2021-01-15 NOTE — Assessment & Plan Note (Signed)
Doing well on trazodone 200 mg, continue same.

## 2021-01-15 NOTE — Patient Instructions (Signed)
Stop by the lab prior to leaving today. I will notify you of your results once received.   We reduced the dose of your duloxetine (Cymbalta) to 30 mg. Take this once daily for pain.   Ask Dr .Tomi Likens about propranolol for your headache prevention.  You will be contacted regarding your referral to home health physical therapy.  Please let us know if you have not been contacted within two weeks.   Be sure to take your levothyroxine (thyroid medication) every morning on an empty stomach with water only.   No food or other medications for 30 minutes.   No heartburn medication, iron pills, calcium, vitamin D, or magnesium pills within four hours of taking levothyroxine.   It was a pleasure to see you today!

## 2021-01-15 NOTE — Progress Notes (Signed)
[facility]   Procedure Note Botox  Attending: Dr. Metta Clines  Preoperative Diagnosis(es): Chronic migraine  Consent obtained from: The patient Benefits discussed included, but were not limited to decreased muscle tightness, increased joint range of motion, and decreased pain.  Risk discussed included, but were not limited pain and discomfort, bleeding, bruising, excessive weakness, venous thrombosis, muscle atrophy and dysphagia.  Anticipated outcomes of the procedure as well as he risks and benefits of the alternatives to the procedure, and the roles and tasks of the personnel to be involved, were discussed with the patient, and the patient consents to the procedure and agrees to proceed. A copy of the patient medication guide was given to the patient which explains the blackbox warning.  Patients identity and treatment sites confirmed Yes.  .  Details of Procedure: Skin was cleaned with alcohol. Prior to injection, the needle plunger was aspirated to make sure the needle was not within a blood vessel.  There was no blood retrieved on aspiration.    Following is a summary of the muscles injected  And the amount of Botulinum toxin used:  Dilution 200 units of Botox was reconstituted with 4 ml of preservative free normal saline. Time of reconstitution: At the time of the office visit (<30 minutes prior to injection)   Injections  155 total units of Botox was injected with a 30 gauge needle.  Injection Sites: L occipitalis: 15 units- 3 sites  R occiptalis: 15 units- 3 sites  L upper trapezius: 15 units- 3 sites R upper trapezius: 15 units- 3 sits          L paraspinal: 10 units- 2 sites R paraspinal: 10 units- 2 sites  Face L frontalis(2 injection sites):10 units   R frontalis(2 injection sites):10 units         L corrugator: 5 units   R corrugator: 5 units           Procerus: 5 units   L temporalis: 20 units R temporalis: 20 units   Agent:  200 units of botulinum Type  A (Onobotulinum Toxin type A) was reconstituted with 4 ml of preservative free normal saline.  Time of reconstitution: At the time of the office visit (<30 minutes prior to injection)     Total injected (Units):  155  Total wasted (Units):  none wasted  Patient tolerated procedure well without complications.   Reinjection is anticipated in 3 months.

## 2021-01-15 NOTE — Assessment & Plan Note (Signed)
Doing well on gabapentin 800 mg 3 times daily, continue same.

## 2021-01-15 NOTE — Assessment & Plan Note (Signed)
Compliant to rosuvastatin 10 mg, continue same. Repeat lipid panel pending.  

## 2021-01-15 NOTE — Assessment & Plan Note (Signed)
Doing well on omeprazole 20 mg twice daily, continue same.

## 2021-01-15 NOTE — Progress Notes (Signed)
Subjective:    Patient ID: [name], female    DOB: 05/24/1934, 86 y.o.   MRN: [id number]  HPI  Shannon Higgins is a very pleasant 86 y.o. female with a history of type 2 diabetes, GERD, hypothyroidism, dementia, recurrent UTI, anemia, TIA, ovarian cancer, breast cancer, Covid-19 infection who presents today for general follow up and to discuss fatigue.   Following with oncology, Shannon Higgins, for ovarian cancer.  Chemotherapy discontinued a few months ago due to concerns for decline secondary to side effects from treatment.  The plan is to forego any further treatment given her age.  She contracted Covid-19 about 1-2 months ago, symptoms of fatigue, cough, fevers, lasted about 10 days. Treated for bronchitis for antibiotics recently. Overall feeling better except for fatigue. She is requesting physical therapy for fatigue from recent Covid-19 infection and a fall. She is sedentary throughout the day.   She is taking her levothyroxine 112 mcg mostly correctly. She is taking Prilosec soon after taking levothyroxine.   Follows with neurology for dementia and migraines, has an appointment coming up soon. She undergoes Botox injections every 3 months and has found these to be very beneficial. She is managed on propranolol 80 mg BID for headache prevention for which I prescribe, she is not sure if this is helping or not, but she is taking twice daily.   She is doing well on gabapentin 800 mg TID and Meloxicam 15 mg daily for chronic back pain.   Doing well on Trazodone 200 mg for sleep, feels well on this regimen overall. She is taking Cymbalta 60 mg for depression and pain. She denies depression, admits to having "no emotion". Her daughter agrees.      Review of Systems  Constitutional:  Positive for fatigue. Negative for fever.  Respiratory:  Negative for cough.   Cardiovascular:  Negative for chest pain.  Musculoskeletal:  Positive for back pain.  Neurological:  Negative for headaches.   Psychiatric/Behavioral:  The patient is not nervous/anxious.         Past Medical History:  Diagnosis Date   Acute tubular injury of transplanted kidney ([place]) 06/14/2016   AKI (acute kidney injury) ([place]) 06/14/2016   Asthma    Breast cancer ([place]) 2015   left   Bronchitis    CAP (community acquired pneumonia) 04/29/2012   Chronic sinusitis    Confusion 12/06/2016   Diabetes mellitus without complication (HCC)    Family history of breast cancer    GERD (gastroesophageal reflux disease)    History of ankle fracture 05/14/2018   Last Assessment & Plan:  With a recent fall. Right medial malleous. Has ortho follow up soon, splinted now and tylenol helps her pain   History of recurrent UTIs    Hypertension    Hypokalemia 04/26/2012   Hyponatremia    Hypothyroidism    Insomnia    Migraine    Neuropathic pain    Ovarian cancer ([place]) 2022   Personal history of breast cancer    Personal history of chemotherapy current   bilateral ovarian ca   Pneumonia    Rheumatoid arthritis ([place])    Sepsis ([place]) 04/26/2012   Sleep apnea    Stroke [facility])    seen in CT scan   Thyroid disease     Social History   Socioeconomic History   Marital status: Widowed    Spouse name: Not on file   Number of children: 2   Years of education: Not on file  Highest education level: Not on file  Occupational History   Occupation: retired   Tobacco Use   Smoking status: Never   Smokeless tobacco: Never  Vaping Use   Vaping Use: Never used  Substance and Sexual Activity   Alcohol use: No   Drug use: No   Sexual activity: Not on file  Other Topics Concern   Not on file  Social History Narrative   Pt lives in 1 story home with her daughter, [name] and [name]'s husband   Has 2 adult daughters   Highest level of education: some college   Worked mainly as Web designer.   Social Determinants of Health   Financial Resource Strain: Not on file  Food Insecurity: Not on file  Transportation  Needs: Not on file  Physical Activity: Not on file  Stress: Not on file  Social Connections: Not on file  Intimate Partner Violence: Not on file    Past Surgical History:  Procedure Laterality Date   ABDOMINAL HYSTERECTOMY     BACK SURGERY     BREAST BIOPSY Right ?`   benign   BREAST LUMPECTOMY Left 2015   breast ca   CHOLECYSTECTOMY     JOINT REPLACEMENT     LAPAROTOMY N/A 07/14/2020   Procedure: LAPAROTOMY;  Surgeon: Gae Dry, MD;  Location: [facility] OR[facility];  Service: Gynecology;  Laterality: N/A;   OOPHORECTOMY     OVARIAN CYST REMOVAL     PORTA CATH INSERTION N/A 07/27/2020   Procedure: PORTA CATH INSERTION;  Surgeon: Algernon Huxley, MD;  Location: [facility];  Service: Cardiovascular;  Laterality: N/A;   REPLACEMENT TOTAL KNEE Right    SALPINGOOPHORECTOMY Bilateral 07/14/2020   Procedure: OPEN SALPINGO OOPHORECTOMY;  Surgeon: Gae Dry, MD;  Location: [facility];  Service: Gynecology;  Laterality: Bilateral;   TOTAL SHOULDER REPLACEMENT Left     Family History  Problem Relation Age of Onset   Diabetes Daughter    Hypertension Daughter    Fibromyalgia Daughter    GER disease Daughter    Fibromyalgia Daughter    Crohn's disease Daughter    Asthma Daughter    Hypertension Mother    Breast cancer Other    Breast cancer Niece    Uterine cancer Niece     No Known Allergies  Current Outpatient Medications on File Prior to Visit  Medication Sig Dispense Refill   Accu-Chek FastClix Lancets MISC USE AS INSTRUCTED TO TEST BLOOD SUGAR DAILY 102 each 1   ACCU-CHEK GUIDE test strip USE AS INSTRUCTED TO TEST BLOOD SUGAR DAILY 100 strip 1   aspirin EC 325 MG tablet Take 325 mg by mouth daily.     blood glucose meter kit and supplies Dispense based on patient and insurance preference. Use up to 2 times daily as directed. (FOR ICD-10 E10.9, E11.9). 1 each 0   BOTOX 200 units SOLR Inject 1 vial into the muscle every 3 (three) months.     Cranberry 1000 MG CAPS Take 1  capsule by mouth 2 (two) times daily.     donepezil (ARICEPT) 10 MG tablet Take 1 tablet (10 mg total) by mouth at bedtime. 90 tablet 3   dorzolamide-timolol (COSOPT) 22.3-6.8 MG/ML ophthalmic solution Place 1 drop into both eyes 2 (two) times daily.     DULoxetine (CYMBALTA) 60 MG capsule TAKE 1 CAPSULE EVERY DAY  FOR  DEPRESSION 90 capsule 1   fluticasone (FLONASE) 50 MCG/ACT nasal spray Place 2 sprays into both nostrils daily as  needed for allergies or rhinitis. 48 g 0   gabapentin (NEURONTIN) 800 MG tablet TAKE 1 TABLET BY MOUTH 3 TIMES A DAY FOR NEUROPATHY 270 tablet 1   latanoprost (XALATAN) 0.005 % ophthalmic solution Place 1 drop into both eyes at bedtime.     levothyroxine (SYNTHROID) 112 MCG tablet TAKE 1 TABLET EVERY MORNING ON AN EMPTY STOMACH WITH WATER ONLY.  NO FOOD OR OTHER MEDICATIONS FOR 30 MINUTES. (Patient taking differently: TAKE 1 TABLET EVERY MORNING ON AN EMPTY STOMACH WITH WATER ONLY.  NO FOOD OR OTHER MEDICATIONS FOR 30 MINUTES.) 90 tablet 1   lidocaine-prilocaine (EMLA) cream Apply to affected area once 30 g 3   meclizine (ANTIVERT) 25 MG tablet TAKE 1 TABLET (25 MG TOTAL) BY MOUTH 2 (TWO) TIMES DAILY AS NEEDED FOR DIZZINESS. 180 tablet 0   meloxicam (MOBIC) 15 MG tablet TAKE 1 TABLET DAILY AS NEEDED FOR PAIN. 90 tablet 0   Netarsudil Dimesylate (RHOPRESSA) 0.02 % SOLN Place 1 drop into both eyes daily at 6 (six) AM.     omeprazole (PRILOSEC) 20 MG capsule TAKE 1 CAPSULE TWICE DAILY FOR HEARTBURN. 180 capsule 1   ondansetron (ZOFRAN) 8 MG tablet Take 1 tablet (8 mg total) by mouth 2 (two) times daily as needed for refractory nausea / vomiting. Start on day 3 after carboplatin chemo. 30 tablet 1   pilocarpine (PILOCAR) 2 % ophthalmic solution Place 1 drop into both eyes 4 (four) times daily.      propranolol (INDERAL) 80 MG tablet Take 1 tablet (80 mg total) by mouth daily. For headache prevention. 90 tablet 1   rosuvastatin (CRESTOR) 10 MG tablet TAKE 1 TABLET DAILY FOR  CHOLESTEROL. 90 tablet 1   traZODone (DESYREL) 100 MG tablet Take 2 tablets (200 mg total) by mouth at bedtime as needed for sleep. 180 tablet 1   No current facility-administered medications on file prior to visit.    BP 110/70 (BP Location: Left Arm, Patient Position: Sitting, Cuff Size: Normal)   Pulse 72   Temp (!) 97.4 F (36.3 C) (Temporal)   Ht _0  (1.626 m)   Wt 159 lb (72.1 kg)   SpO2 96%   BMI 27.29 kg/m  Objective:   Physical Exam Constitutional:      Appearance: She is not ill-appearing.  Cardiovascular:     Rate and Rhythm: Normal rate and regular rhythm.  Pulmonary:     Effort: Pulmonary effort is normal.     Breath sounds: Normal breath sounds.  Musculoskeletal:     Cervical back: Neck supple.  Skin:    General: Skin is warm and dry.  Neurological:     Mental Status: She is alert and oriented to person, place, and time.  Psychiatric:        Mood and Affect: Mood normal.          Assessment & Plan:      This visit occurred during the SARS-CoV-2 public health emergency.  Safety protocols were in place, including screening questions prior to the visit, additional usage of staff PPE, and extensive cleaning of exam room while observing appropriate contact time as indicated for disinfecting solutions.

## 2021-01-15 NOTE — Assessment & Plan Note (Signed)
Repeat A1c pending, not currently on treatment. Continue off treatment.

## 2021-01-15 NOTE — Assessment & Plan Note (Signed)
Agree to physical therapy, home health.  Referral placed.

## 2021-01-15 NOTE — Addendum Note (Signed)
Addended by: Venetia Night on: 01/15/2021 03:52 PM   Modules accepted: Orders

## 2021-01-15 NOTE — Assessment & Plan Note (Signed)
Patient denies symptoms of depression. Given symptoms mentioned today, will reduce dose of Cymbalta to 30 mg.  She also takes Cymbalta for pain, so they will update if pain increases with dose reduction.

## 2021-01-15 NOTE — Assessment & Plan Note (Signed)
Follows with neurology, receives Botox injections every 3 months.  Feels very well managed.  Unclear if propanolol 80 mg daily as playing a role in headache prevention.  Would prefer we discontinue if no improvement.  She will discuss with her neurologist.

## 2021-01-15 NOTE — Assessment & Plan Note (Signed)
Following with oncology, no longer on chemotherapy as this was discontinued per patient and daughter request.  Last office visit note reviewed from oncology from July 2022.

## 2021-01-15 NOTE — Assessment & Plan Note (Signed)
Alert and oriented today, follows all commands, answers all questions appropriately.  Following with neurology, continue Aricept 10 mg daily.

## 2021-01-15 NOTE — Assessment & Plan Note (Signed)
She is taking levothyroxine mostly correctly, however is taking Prilosec within 4 hours.  Discussed proper administration for levothyroxine. Continue 112 mcg of levothyroxine, repeat TSH pending.

## 2021-01-15 NOTE — Assessment & Plan Note (Signed)
Doing well on 80 mg of gabapentin 3 times daily, meloxicam 15 mg daily.  Continue same.

## 2021-01-16 ENCOUNTER — Ambulatory Visit (INDEPENDENT_AMBULATORY_CARE_PROVIDER_SITE_OTHER): Payer: Medicare HMO | Admitting: Primary Care

## 2021-01-16 VITALS — Ht 64.0 in | Wt 159.0 lb

## 2021-01-16 DIAGNOSIS — Z0000 Encounter for general adult medical examination without abnormal findings: Secondary | ICD-10-CM | POA: Diagnosis not present

## 2021-01-16 NOTE — Progress Notes (Signed)
Subjective:   Shannon Higgins is a 86 y.o. female who presents for Medicare Annual (Subsequent) preventive examination.  Review of Systems     [name] , Thank you for taking time to come for your Medicare Wellness Visit. I appreciate your ongoing commitment to your health goals. Please review the following plan we discussed and let me know if I can assist you in the future.   These are the goals we discussed:  Goals      Activity and Exercise Increased     Evidence-based guidance:  Review current exercise levels.  Assess patient perspective on exercise or activity level, barriers to increasing activity, motivation and readiness for change.  Recommend or set healthy exercise goal based on individual tolerance.  Encourage small steps toward making change in amount of exercise or activity.  Urge reduction of sedentary activities or screen time.  Promote group activities within the community or with family or support person.  Consider referral to rehabiliation therapist for assessment and exercise/activity plan.   Notes:      Patient Stated     02/26/2019, I will increase my exercise and help to improve my unsteadiness.         This is a list of the screening recommended for you and due dates:  Health Maintenance  Topic Date Due   Complete foot exam   Never done   Urine Protein Check  12/30/2018   Zoster (Shingles) Vaccine (2 of 2) 05/01/2019   Eye exam for diabetics  09/18/2019   COVID-19 Vaccine (4 - Booster for Pfizer series) 09/25/2020   Hemoglobin A1C  07/15/2021   Tetanus Vaccine  12/08/2028   Flu Shot  Completed   DEXA scan (bone density measurement)  Completed   HPV Vaccine  Aged Out          Objective:    Today's Vitals   01/16/21 1050 01/16/21 1055  Weight: 159 lb (72.1 kg)   Height: 5' 4" (1.626 m)   PainSc: 0-No pain 0-No pain   Body mass index is 27.29 kg/m.  Advanced Directives 11/24/2020 10/27/2020 09/01/2020 08/14/2020 08/04/2020 07/27/2020  07/24/2020  Does Patient Have a Medical Advance Directive? _0  No No  Would patient like information on creating a medical advance directive? - - No - Patient declined Yes (MAU/Ambulatory/Procedural Areas - Information given) - Yes (MAU/Ambulatory/Procedural Areas - Information given) -    Current Medications (verified) Outpatient Encounter Medications as of 01/16/2021  Medication Sig   Accu-Chek FastClix Lancets MISC USE AS INSTRUCTED TO TEST BLOOD SUGAR DAILY   ACCU-CHEK GUIDE test strip USE AS INSTRUCTED TO TEST BLOOD SUGAR DAILY   aspirin EC 325 MG tablet Take 325 mg by mouth daily.   blood glucose meter kit and supplies Dispense based on patient and insurance preference. Use up to 2 times daily as directed. (FOR ICD-10 E10.9, E11.9).   BOTOX 200 units SOLR Inject 1 vial into the muscle every 3 (three) months.   Cranberry 1000 MG CAPS Take 1 capsule by mouth 2 (two) times daily.   donepezil (ARICEPT) 10 MG tablet Take 1 tablet (10 mg total) by mouth at bedtime.   dorzolamide-timolol (COSOPT) 22.3-6.8 MG/ML ophthalmic solution Place 1 drop into both eyes 2 (two) times daily.   DULoxetine (CYMBALTA) 30 MG capsule Take 1 capsule (30 mg total) by mouth daily. For depression and pain.   fluticasone (FLONASE) 50 MCG/ACT nasal spray Place 2 sprays into both nostrils daily  as needed for allergies or rhinitis.   gabapentin (NEURONTIN) 800 MG tablet TAKE 1 TABLET BY MOUTH 3 TIMES A DAY FOR NEUROPATHY   latanoprost (XALATAN) 0.005 % ophthalmic solution Place 1 drop into both eyes at bedtime.   levothyroxine (SYNTHROID) 112 MCG tablet TAKE 1 TABLET EVERY MORNING ON AN EMPTY STOMACH WITH WATER ONLY.  NO FOOD OR OTHER MEDICATIONS FOR 30 MINUTES. (Patient taking differently: TAKE 1 TABLET EVERY MORNING ON AN EMPTY STOMACH WITH WATER ONLY.  NO FOOD OR OTHER MEDICATIONS FOR 30 MINUTES.)   meclizine (ANTIVERT) 25 MG tablet TAKE 1 TABLET (25 MG TOTAL) BY MOUTH 2 (TWO) TIMES DAILY AS NEEDED FOR  DIZZINESS.   meloxicam (MOBIC) 15 MG tablet TAKE 1 TABLET DAILY AS NEEDED FOR PAIN.   Netarsudil Dimesylate (RHOPRESSA) 0.02 % SOLN Place 1 drop into both eyes daily at 6 (six) AM.   omeprazole (PRILOSEC) 20 MG capsule TAKE 1 CAPSULE TWICE DAILY FOR HEARTBURN.   pilocarpine (PILOCAR) 2 % ophthalmic solution Place 1 drop into both eyes 4 (four) times daily.    propranolol (INDERAL) 80 MG tablet Take 1 tablet (80 mg total) by mouth daily. For headache prevention.   rosuvastatin (CRESTOR) 10 MG tablet TAKE 1 TABLET DAILY FOR CHOLESTEROL.   traZODone (DESYREL) 100 MG tablet Take 2 tablets (200 mg total) by mouth at bedtime as needed for sleep.   lidocaine-prilocaine (EMLA) cream Apply to affected area once (Patient not taking: Reported on 01/16/2021)   ondansetron (ZOFRAN) 8 MG tablet Take 1 tablet (8 mg total) by mouth 2 (two) times daily as needed for refractory nausea / vomiting. Start on day 3 after carboplatin chemo. (Patient not taking: Reported on 01/16/2021)   No facility-administered encounter medications on file as of 01/16/2021.    Allergies (verified) Patient has no known allergies.   History: Past Medical History:  Diagnosis Date   Acute metabolic encephalopathy 44/05/270   Acute tubular injury of transplanted kidney ([place]) 06/14/2016   AKI (acute kidney injury) ([place]) 06/14/2016   Asthma    Breast cancer ([place]) 2015   left   Bronchitis    CAP (community acquired pneumonia) 04/29/2012   Chronic sinusitis    Closed fracture of medial malleolus 05/11/2018   Confusion 12/06/2016   Diabetes mellitus without complication (HCC)    Family history of breast cancer    GERD (gastroesophageal reflux disease)    History of ankle fracture 05/14/2018   Last Assessment & Plan:  With a recent fall. Right medial malleous. Has ortho follow up soon, splinted now and tylenol helps her pain   History of recurrent UTIs    Hypertension    Hypokalemia 04/26/2012   Hyponatremia    Hypothyroidism     Imbalance 09/02/2019   Insomnia    Migraine    Neuropathic pain    Ovarian cancer ([place]) 2022   Ovarian mass, left 07/14/2020   Personal history of breast cancer    Personal history of chemotherapy current   bilateral ovarian ca   Pneumonia    Rheumatoid arthritis ([place])    Sepsis ([place]) 04/26/2012   Sepsis secondary to UTI ([place]) 04/26/2012   Sleep apnea    Stroke [facility])    seen in CT scan   Thyroid disease    Transient hypotension 06/14/2016   Past Surgical History:  Procedure Laterality Date   ABDOMINAL HYSTERECTOMY     BACK SURGERY     BREAST BIOPSY Right ?`   benign   BREAST LUMPECTOMY Left 2015  breast ca   CHOLECYSTECTOMY     JOINT REPLACEMENT     LAPAROTOMY N/A 07/14/2020   Procedure: LAPAROTOMY;  Surgeon: Gae Dry, MD;  Location: [facility] ORS;  Service: Gynecology;  Laterality: N/A;   OOPHORECTOMY     OVARIAN CYST REMOVAL     PORTA CATH INSERTION N/A 07/27/2020   Procedure: PORTA CATH INSERTION;  Surgeon: Algernon Huxley, MD;  Location: [facility];  Service: Cardiovascular;  Laterality: N/A;   REPLACEMENT TOTAL KNEE Right    SALPINGOOPHORECTOMY Bilateral 07/14/2020   Procedure: OPEN SALPINGO OOPHORECTOMY;  Surgeon: Gae Dry, MD;  Location: [facility] ORS;  Service: Gynecology;  Laterality: Bilateral;   TOTAL SHOULDER REPLACEMENT Left    Family History  Problem Relation Age of Onset   Diabetes Daughter    Hypertension Daughter    Fibromyalgia Daughter    GER disease Daughter    Fibromyalgia Daughter    Crohn's disease Daughter    Asthma Daughter    Hypertension Mother    Breast cancer Other    Breast cancer Niece    Uterine cancer Niece    Social History   Socioeconomic History   Marital status: Widowed    Spouse name: Not on file   Number of children: 2   Years of education: Not on file   Highest education level: Not on file  Occupational History   Occupation: retired   Tobacco Use   Smoking status: Never   Smokeless tobacco: Never  Vaping  Use   Vaping Use: Never used  Substance and Sexual Activity   Alcohol use: No   Drug use: No   Sexual activity: Not Currently  Other Topics Concern   Not on file  Social History Narrative   Pt lives in 1 story home with her daughter, [name] and [name]'s husband   Has 2 adult daughters   Highest level of education: some college   Worked mainly as Web designer.   Social Determinants of Health   Financial Resource Strain: Not on file  Food Insecurity: Not on file  Transportation Needs: Not on file  Physical Activity: Not on file  Stress: Not on file  Social Connections: Not on file    Tobacco Counseling Counseling given: Not Answered   Clinical Intake:  Pre-visit preparation completed: Yes  Pain : No/denies pain Pain Score: 0-No pain     Nutritional Risks: None Diabetes: Yes CBG done?: No Did pt. bring in CBG monitor from home?: No  How often do you need to have someone help you when you read instructions, pamphlets, or other written materials from your doctor or pharmacy?: 1 - Never What is the last grade level you completed in school?: 1 year in college  Diabetic?yes  Interpreter Needed?: No  Information entered by :: v.h   Activities of Daily Living In your present state of health, do you have any difficulty performing the following activities: 01/16/2021 07/27/2020  Hearing? N N  Vision? Y N  Difficulty concentrating or making decisions? N N  Walking or climbing stairs? Y Y  Dressing or bathing? N N  Doing errands, shopping? Y -  Some recent data might be hidden    Patient Care Team: Pleas Koch, NP as PCP - General (Internal Medicine) Cameron Sprang, MD as Consulting Physician (Neurology) Clent Jacks, RN as Oncology Nurse Navigator  Indicate any recent Medical Services you may have received from other than [facility] providers in the past year (date may be  approximate).     Assessment:   This is a routine wellness examination for  [place].  Hearing/Vision screen No results found.  Dietary issues and exercise activities discussed: Current Exercise Habits: The patient does not participate in regular exercise at present, Exercise limited by: None identified   Goals Addressed             This Visit's Progress    Activity and Exercise Increased       Evidence-based guidance:  Review current exercise levels.  Assess patient perspective on exercise or activity level, barriers to increasing activity, motivation and readiness for change.  Recommend or set healthy exercise goal based on individual tolerance.  Encourage small steps toward making change in amount of exercise or activity.  Urge reduction of sedentary activities or screen time.  Promote group activities within the community or with family or support person.  Consider referral to rehabiliation therapist for assessment and exercise/activity plan.   Notes:       Depression Screen PHQ 2/9 Scores 01/16/2021 01/16/2021 05/05/2020 02/26/2019 06/21/2017  PHQ - 2 Score 0 0 0 0 3  PHQ- 9 Score 3 - 0 0 12    Fall Risk Fall Risk  01/16/2021 07/24/2020 05/08/2019 02/26/2019 10/01/2018  Falls in the past year? 1 0 _0 Comment - - - lost of balance -  Number falls in past yr: 1 0 _1 Injury with Fall? 0 0 0 0 1  Risk for fall due to : Impaired balance/gait - Impaired balance/gait Medication side effect;Impaired balance/gait -  Follow up Falls evaluation completed - Falls evaluation completed Falls evaluation completed;Falls prevention discussed -    FALL RISK PREVENTION PERTAINING TO THE HOME:  Any stairs in or around the home? Yes  If so, are there any without handrails? No  Home free of loose throw rugs in walkways, pet beds, electrical cords, etc? No  Adequate lighting in your home to reduce risk of falls? Yes   ASSISTIVE DEVICES UTILIZED TO PREVENT FALLS:  Life alert? No  Use of a cane, walker or w/c? Yes  Grab bars in the bathroom? No  Shower chair  or bench in shower? Yes  Elevated toilet seat or a handicapped toilet? Yes   TIMED UP AND GO:  Was the test performed? No .  Length of time to ambulate 10 feet: n/a sec.   Gait slow and steady with assistive device  Cognitive Function: MMSE - Mini Mental State Exam 02/26/2019  Orientation to time 5  Orientation to Place 5  Registration 3  Attention/ Calculation 5  Recall 3  Language- repeat 1   Montreal Cognitive Assessment  02/08/2017  Visuospatial/ Executive (0/5) 3  Naming (0/3) 1  Attention: Read list of digits (0/2) 2  Attention: Read list of letters (0/1) 0  Attention: Serial 7 subtraction starting at 100 (0/3) 3  Language: Repeat phrase (0/2) 2  Language : Fluency (0/1) 0  Abstraction (0/2) 2  Delayed Recall (0/5) 3  Orientation (0/6) 6  Total 22   6CIT Screen 01/16/2021  What Year? 0 points  What month? 0 points  What time? 0 points  Count back from 20 0 points  Months in reverse 0 points  Repeat phrase 0 points  Total Score 0    Immunizations Immunization History  Administered Date(s) Administered   Fluad Quad(high Dose 65+) 05/05/2020, 01/15/2021   Influenza,inj,Quad PF,6+ Mos 01/29/2018, 03/04/2019   PFIZER(Purple Top)SARS-COV-2 Vaccination 05/21/2019, 06/10/2019, 07/03/2020  Pneumococcal Polysaccharide-23 06/20/2018   Tdap 12/09/2018   Zoster Recombinat (Shingrix) 03/06/2019    TDAP status: Up to date  Flu Vaccine status: Up to date  Pneumococcal vaccine status: Up to date  Covid-19 vaccine status: Completed vaccines  Qualifies for Shingles Vaccine? Yes   Zostavax completed No   Shingrix Completed?: No.    Education has been provided regarding the importance of this vaccine. Patient has been advised to call insurance company to determine out of pocket expense if they have not yet received this vaccine. Advised may also receive vaccine at local pharmacy or Health Dept. Verbalized acceptance and understanding.  Screening Tests Health  Maintenance  Topic Date Due   FOOT EXAM  Never done   URINE MICROALBUMIN  12/30/2018   Zoster Vaccines- Shingrix (2 of 2) 05/01/2019   OPHTHALMOLOGY EXAM  09/18/2019   COVID-19 Vaccine (4 - Booster for Pfizer series) 09/25/2020   HEMOGLOBIN A1C  07/15/2021   TETANUS/TDAP  12/08/2028   INFLUENZA VACCINE  Completed   DEXA SCAN  Completed   HPV VACCINES  Aged Out    Health Maintenance  Health Maintenance Due  Topic Date Due   FOOT EXAM  Never done   URINE MICROALBUMIN  12/30/2018   Zoster Vaccines- Shingrix (2 of 2) 05/01/2019   OPHTHALMOLOGY EXAM  09/18/2019   COVID-19 Vaccine (4 - Booster for Pfizer series) 09/25/2020    Colorectal cancer screening: Type of screening: Colonoscopy. Completed n/a. Repeat every 10 years  Mammogram status: Completed 1. Repeat every year  Bone Density status: Completed n/a. Results reflect: Bone density results: NORMAL. Repeat every 1 years.  Lung Cancer Screening: (Low Dose CT Chest recommended if Age 47-80 years, 30 pack-year currently smoking OR have quit w/in 15years.) does not qualify.   Lung Cancer Screening Referral: n/a  Additional Screening:  Hepatitis C Screening: does not qualify; Completed n/a  Vision Screening: Recommended annual ophthalmology exams for early detection of glaucoma and other disorders of the eye. Is the patient up to date with their annual eye exam?  Yes  Who is the provider or what is the name of the office in which the patient attends annual eye exams? [facility] If pt is not established with a provider, would they like to be referred to a provider to establish care? No .   Dental Screening: Recommended annual dental exams for proper oral hygiene  Community Resource Referral / Chronic Care Management: CRR required this visit?  No   CCM required this visit?  No      Plan:     I have personally reviewed and noted the following in the patient's chart:   Medical and social history Use of alcohol,  tobacco or illicit drugs  Current medications and supplements including opioid prescriptions.  Functional ability and status Nutritional status Physical activity Advanced directives List of other physicians Hospitalizations, surgeries, and ER visits in previous 12 months Vitals Screenings to include cognitive, depression, and falls Referrals and appointments  In addition, I have reviewed and discussed with patient certain preventive protocols, quality metrics, and best practice recommendations. A written personalized care plan for preventive services as well as general preventive health recommendations were provided to patient.     Debbora Dus, [place]   01/16/2021   Nurse Notes: successful

## 2021-01-20 NOTE — Addendum Note (Signed)
Addended by: Azalee Course T on: 01/20/2021 03:46 PM   Modules accepted: Level of Service

## 2021-01-22 ENCOUNTER — Telehealth: Payer: Self-pay

## 2021-01-22 DIAGNOSIS — E1142 Type 2 diabetes mellitus with diabetic polyneuropathy: Secondary | ICD-10-CM | POA: Diagnosis not present

## 2021-01-22 DIAGNOSIS — M545 Low back pain, unspecified: Secondary | ICD-10-CM | POA: Diagnosis not present

## 2021-01-22 DIAGNOSIS — C563 Malignant neoplasm of bilateral ovaries: Secondary | ICD-10-CM | POA: Diagnosis not present

## 2021-01-22 DIAGNOSIS — R5383 Other fatigue: Secondary | ICD-10-CM | POA: Diagnosis not present

## 2021-01-22 DIAGNOSIS — G8929 Other chronic pain: Secondary | ICD-10-CM | POA: Diagnosis not present

## 2021-01-22 DIAGNOSIS — G43909 Migraine, unspecified, not intractable, without status migrainosus: Secondary | ICD-10-CM | POA: Diagnosis not present

## 2021-01-22 DIAGNOSIS — U099 Post covid-19 condition, unspecified: Secondary | ICD-10-CM | POA: Diagnosis not present

## 2021-01-22 DIAGNOSIS — F32A Depression, unspecified: Secondary | ICD-10-CM | POA: Diagnosis not present

## 2021-01-22 DIAGNOSIS — D630 Anemia in neoplastic disease: Secondary | ICD-10-CM | POA: Diagnosis not present

## 2021-01-22 NOTE — Telephone Encounter (Signed)
I have called and spoke to patient she would like to set up virtual visit I was not able to find one let her know someone from our office will call and take a look at it for her.

## 2021-01-22 NOTE — Telephone Encounter (Signed)
Called v/m full not able to leave v/m

## 2021-01-22 NOTE — Telephone Encounter (Signed)
Shannon Higgins - Client TELEPHONE ADVICE RECORD [facility] Patient Name: Shannon Higgins Gender: Female DOB: 06/07/1934 Age: 86 Y 42 M 26 D Return Phone Number: [phone] (Primary), [phone] (Secondary) Address: City/ State/ Zip: [place] [address] Client Shannon Higgins - Client Client Site [place] - Higgins Physician Alma Friendly - NP Contact Type Call Who Is Calling Patient / Member / Family / Caregiver Call Type Triage / Clinical Relationship To Patient Self Return Phone Number [phone] (Secondary) Chief Complaint Eye Pain Reason for Call Symptomatic / Request for Health Information Initial Comment Pt is congested, fatigued, and eyes are burning. Translation No Disp. Time Eilene Ghazi Time) Disposition Final User 01/21/2021 4:33:28 PM Attempt made - no message left Ferdinand Lango RN, Kenney Houseman 01/21/2021 5:11:59 PM FINAL ATTEMPT MADE - no message left Yes Ferdinand Lango, RN, Kenney Houseman

## 2021-01-22 NOTE — Telephone Encounter (Signed)
Unable to reach pt by phone and mailbox is full. Sending note to [facility] CMA.

## 2021-01-22 NOTE — Telephone Encounter (Signed)
Pt returning call

## 2021-01-22 NOTE — Telephone Encounter (Signed)
Shannon Higgins front office mgr said no available appts at [facility] today and request I call pt to possibly suggest UC visit. I called pt and no answer and v/m is full; sending note to [facility] CMA.

## 2021-01-25 NOTE — Telephone Encounter (Signed)
Mail box is still full not able to follow up with patient.

## 2021-01-26 DIAGNOSIS — U099 Post covid-19 condition, unspecified: Secondary | ICD-10-CM | POA: Diagnosis not present

## 2021-01-26 DIAGNOSIS — D630 Anemia in neoplastic disease: Secondary | ICD-10-CM | POA: Diagnosis not present

## 2021-01-26 DIAGNOSIS — F32A Depression, unspecified: Secondary | ICD-10-CM | POA: Diagnosis not present

## 2021-01-26 DIAGNOSIS — G43909 Migraine, unspecified, not intractable, without status migrainosus: Secondary | ICD-10-CM | POA: Diagnosis not present

## 2021-01-26 DIAGNOSIS — G8929 Other chronic pain: Secondary | ICD-10-CM | POA: Diagnosis not present

## 2021-01-26 DIAGNOSIS — C563 Malignant neoplasm of bilateral ovaries: Secondary | ICD-10-CM | POA: Diagnosis not present

## 2021-01-26 DIAGNOSIS — E1142 Type 2 diabetes mellitus with diabetic polyneuropathy: Secondary | ICD-10-CM | POA: Diagnosis not present

## 2021-01-26 DIAGNOSIS — M545 Low back pain, unspecified: Secondary | ICD-10-CM | POA: Diagnosis not present

## 2021-01-26 DIAGNOSIS — R5383 Other fatigue: Secondary | ICD-10-CM | POA: Diagnosis not present

## 2021-01-28 DIAGNOSIS — M545 Low back pain, unspecified: Secondary | ICD-10-CM | POA: Diagnosis not present

## 2021-01-28 DIAGNOSIS — G43909 Migraine, unspecified, not intractable, without status migrainosus: Secondary | ICD-10-CM | POA: Diagnosis not present

## 2021-01-28 DIAGNOSIS — G8929 Other chronic pain: Secondary | ICD-10-CM | POA: Diagnosis not present

## 2021-01-28 DIAGNOSIS — F32A Depression, unspecified: Secondary | ICD-10-CM | POA: Diagnosis not present

## 2021-01-28 DIAGNOSIS — D630 Anemia in neoplastic disease: Secondary | ICD-10-CM | POA: Diagnosis not present

## 2021-01-28 DIAGNOSIS — R5383 Other fatigue: Secondary | ICD-10-CM | POA: Diagnosis not present

## 2021-01-28 DIAGNOSIS — C563 Malignant neoplasm of bilateral ovaries: Secondary | ICD-10-CM | POA: Diagnosis not present

## 2021-01-28 DIAGNOSIS — U099 Post covid-19 condition, unspecified: Secondary | ICD-10-CM | POA: Diagnosis not present

## 2021-01-28 DIAGNOSIS — E1142 Type 2 diabetes mellitus with diabetic polyneuropathy: Secondary | ICD-10-CM | POA: Diagnosis not present

## 2021-02-01 DIAGNOSIS — H401113 Primary open-angle glaucoma, right eye, severe stage: Secondary | ICD-10-CM | POA: Diagnosis not present

## 2021-02-03 DIAGNOSIS — G8929 Other chronic pain: Secondary | ICD-10-CM | POA: Diagnosis not present

## 2021-02-03 DIAGNOSIS — D630 Anemia in neoplastic disease: Secondary | ICD-10-CM | POA: Diagnosis not present

## 2021-02-03 DIAGNOSIS — R5383 Other fatigue: Secondary | ICD-10-CM | POA: Diagnosis not present

## 2021-02-03 DIAGNOSIS — M545 Low back pain, unspecified: Secondary | ICD-10-CM | POA: Diagnosis not present

## 2021-02-03 DIAGNOSIS — C563 Malignant neoplasm of bilateral ovaries: Secondary | ICD-10-CM | POA: Diagnosis not present

## 2021-02-03 DIAGNOSIS — U099 Post covid-19 condition, unspecified: Secondary | ICD-10-CM | POA: Diagnosis not present

## 2021-02-03 DIAGNOSIS — E1142 Type 2 diabetes mellitus with diabetic polyneuropathy: Secondary | ICD-10-CM | POA: Diagnosis not present

## 2021-02-03 DIAGNOSIS — F32A Depression, unspecified: Secondary | ICD-10-CM | POA: Diagnosis not present

## 2021-02-03 DIAGNOSIS — G43909 Migraine, unspecified, not intractable, without status migrainosus: Secondary | ICD-10-CM | POA: Diagnosis not present

## 2021-02-04 ENCOUNTER — Other Ambulatory Visit: Payer: Self-pay | Admitting: Primary Care

## 2021-02-04 DIAGNOSIS — G8929 Other chronic pain: Secondary | ICD-10-CM

## 2021-02-04 DIAGNOSIS — M545 Low back pain, unspecified: Secondary | ICD-10-CM

## 2021-02-05 DIAGNOSIS — G43909 Migraine, unspecified, not intractable, without status migrainosus: Secondary | ICD-10-CM | POA: Diagnosis not present

## 2021-02-05 DIAGNOSIS — D630 Anemia in neoplastic disease: Secondary | ICD-10-CM | POA: Diagnosis not present

## 2021-02-05 DIAGNOSIS — F32A Depression, unspecified: Secondary | ICD-10-CM | POA: Diagnosis not present

## 2021-02-05 DIAGNOSIS — E1142 Type 2 diabetes mellitus with diabetic polyneuropathy: Secondary | ICD-10-CM | POA: Diagnosis not present

## 2021-02-05 DIAGNOSIS — U099 Post covid-19 condition, unspecified: Secondary | ICD-10-CM | POA: Diagnosis not present

## 2021-02-05 DIAGNOSIS — R5383 Other fatigue: Secondary | ICD-10-CM | POA: Diagnosis not present

## 2021-02-05 DIAGNOSIS — G8929 Other chronic pain: Secondary | ICD-10-CM | POA: Diagnosis not present

## 2021-02-05 DIAGNOSIS — C563 Malignant neoplasm of bilateral ovaries: Secondary | ICD-10-CM | POA: Diagnosis not present

## 2021-02-05 DIAGNOSIS — M545 Low back pain, unspecified: Secondary | ICD-10-CM | POA: Diagnosis not present

## 2021-02-08 DIAGNOSIS — E1142 Type 2 diabetes mellitus with diabetic polyneuropathy: Secondary | ICD-10-CM | POA: Diagnosis not present

## 2021-02-08 DIAGNOSIS — R5383 Other fatigue: Secondary | ICD-10-CM | POA: Diagnosis not present

## 2021-02-08 DIAGNOSIS — D630 Anemia in neoplastic disease: Secondary | ICD-10-CM | POA: Diagnosis not present

## 2021-02-08 DIAGNOSIS — C563 Malignant neoplasm of bilateral ovaries: Secondary | ICD-10-CM | POA: Diagnosis not present

## 2021-02-08 DIAGNOSIS — U099 Post covid-19 condition, unspecified: Secondary | ICD-10-CM | POA: Diagnosis not present

## 2021-02-08 DIAGNOSIS — G43909 Migraine, unspecified, not intractable, without status migrainosus: Secondary | ICD-10-CM | POA: Diagnosis not present

## 2021-02-08 DIAGNOSIS — M545 Low back pain, unspecified: Secondary | ICD-10-CM | POA: Diagnosis not present

## 2021-02-08 DIAGNOSIS — G8929 Other chronic pain: Secondary | ICD-10-CM | POA: Diagnosis not present

## 2021-02-08 DIAGNOSIS — F32A Depression, unspecified: Secondary | ICD-10-CM | POA: Diagnosis not present

## 2021-02-10 ENCOUNTER — Telehealth: Payer: Self-pay | Admitting: Primary Care

## 2021-02-10 NOTE — Chronic Care Management (AMB) (Signed)
  Chronic Care Management   Note  02/10/2021 Name: Shannon Higgins MRN: [id number] DOB: February 23, 1935  Shannon Higgins is a 86 y.o. year old female who is a primary care patient of Pleas Koch, NP. I reached out to Ovid Curd by phone today in response to a referral sent by [name] PCP, Pleas Koch, NP.   Shannon Higgins was given information about Chronic Care Management services today including:  CCM service includes personalized support from designated clinical staff supervised by her physician, including individualized plan of care and coordination with other care providers 24/7 contact phone numbers for assistance for urgent and routine care needs. Service will only be billed when office clinical staff spend 20 minutes or more in a month to coordinate care. Only one practitioner may furnish and bill the service in a calendar month. The patient may stop CCM services at any time (effective at the end of the month) by phone call to the office staff.   Shannon Higgins/ DAUGHTER verbally agreed to assistance and services provided by embedded care coordination/care management team today.  Follow up plan:   Shannon Higgins Secretary/administrator

## 2021-02-11 DIAGNOSIS — G43909 Migraine, unspecified, not intractable, without status migrainosus: Secondary | ICD-10-CM | POA: Diagnosis not present

## 2021-02-11 DIAGNOSIS — R5383 Other fatigue: Secondary | ICD-10-CM | POA: Diagnosis not present

## 2021-02-11 DIAGNOSIS — D630 Anemia in neoplastic disease: Secondary | ICD-10-CM | POA: Diagnosis not present

## 2021-02-11 DIAGNOSIS — G8929 Other chronic pain: Secondary | ICD-10-CM | POA: Diagnosis not present

## 2021-02-11 DIAGNOSIS — C563 Malignant neoplasm of bilateral ovaries: Secondary | ICD-10-CM | POA: Diagnosis not present

## 2021-02-11 DIAGNOSIS — E1142 Type 2 diabetes mellitus with diabetic polyneuropathy: Secondary | ICD-10-CM | POA: Diagnosis not present

## 2021-02-11 DIAGNOSIS — F32A Depression, unspecified: Secondary | ICD-10-CM | POA: Diagnosis not present

## 2021-02-11 DIAGNOSIS — M545 Low back pain, unspecified: Secondary | ICD-10-CM | POA: Diagnosis not present

## 2021-02-11 DIAGNOSIS — U099 Post covid-19 condition, unspecified: Secondary | ICD-10-CM | POA: Diagnosis not present

## 2021-02-15 DIAGNOSIS — H401123 Primary open-angle glaucoma, left eye, severe stage: Secondary | ICD-10-CM | POA: Diagnosis not present

## 2021-02-18 DIAGNOSIS — U099 Post covid-19 condition, unspecified: Secondary | ICD-10-CM | POA: Diagnosis not present

## 2021-02-18 DIAGNOSIS — G8929 Other chronic pain: Secondary | ICD-10-CM | POA: Diagnosis not present

## 2021-02-18 DIAGNOSIS — D630 Anemia in neoplastic disease: Secondary | ICD-10-CM | POA: Diagnosis not present

## 2021-02-18 DIAGNOSIS — E1142 Type 2 diabetes mellitus with diabetic polyneuropathy: Secondary | ICD-10-CM | POA: Diagnosis not present

## 2021-02-18 DIAGNOSIS — F32A Depression, unspecified: Secondary | ICD-10-CM | POA: Diagnosis not present

## 2021-02-18 DIAGNOSIS — G43909 Migraine, unspecified, not intractable, without status migrainosus: Secondary | ICD-10-CM | POA: Diagnosis not present

## 2021-02-18 DIAGNOSIS — R5383 Other fatigue: Secondary | ICD-10-CM | POA: Diagnosis not present

## 2021-02-18 DIAGNOSIS — M545 Low back pain, unspecified: Secondary | ICD-10-CM | POA: Diagnosis not present

## 2021-02-18 DIAGNOSIS — C563 Malignant neoplasm of bilateral ovaries: Secondary | ICD-10-CM | POA: Diagnosis not present

## 2021-02-25 DIAGNOSIS — E1142 Type 2 diabetes mellitus with diabetic polyneuropathy: Secondary | ICD-10-CM | POA: Diagnosis not present

## 2021-02-25 DIAGNOSIS — U099 Post covid-19 condition, unspecified: Secondary | ICD-10-CM | POA: Diagnosis not present

## 2021-02-25 DIAGNOSIS — M545 Low back pain, unspecified: Secondary | ICD-10-CM | POA: Diagnosis not present

## 2021-02-25 DIAGNOSIS — F32A Depression, unspecified: Secondary | ICD-10-CM | POA: Diagnosis not present

## 2021-02-25 DIAGNOSIS — D630 Anemia in neoplastic disease: Secondary | ICD-10-CM | POA: Diagnosis not present

## 2021-02-25 DIAGNOSIS — R5383 Other fatigue: Secondary | ICD-10-CM | POA: Diagnosis not present

## 2021-02-25 DIAGNOSIS — G43909 Migraine, unspecified, not intractable, without status migrainosus: Secondary | ICD-10-CM | POA: Diagnosis not present

## 2021-02-25 DIAGNOSIS — C563 Malignant neoplasm of bilateral ovaries: Secondary | ICD-10-CM | POA: Diagnosis not present

## 2021-02-25 DIAGNOSIS — G8929 Other chronic pain: Secondary | ICD-10-CM | POA: Diagnosis not present

## 2021-02-25 IMAGING — MR MRI HEAD WITHOUT CONTRAST
10 of 11 series · 34 of 48 positions shown · non-contrast
Comparison: Head CT 09/13/2018

CLINICAL DATA: Fe facial droop and right-sided numbness. Bilateral
lower extremity weakness.

EXAM:
MRI HEAD WITHOUT CONTRAST
MRA HEAD WITHOUT CONTRAST
TECHNIQUE: Multiplanar, multiecho pulse sequences of the brain and surrounding
structures were obtained without intravenous contrast. Angiographic
images of the head were obtained using MRA technique without
contrast.

[Series 2: ax dwi_tracew · axial · 3.0mm · 0.68mm/px · z∈[-82,+78]mm · 4 of 54 slices shown]
[im 1/54]
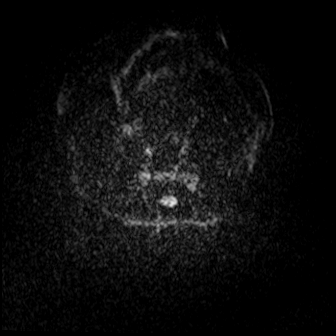
[im 18/54]
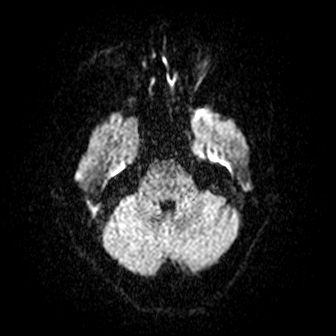
[im 36/54]
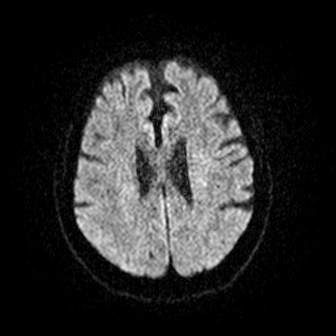
[im 54/54]
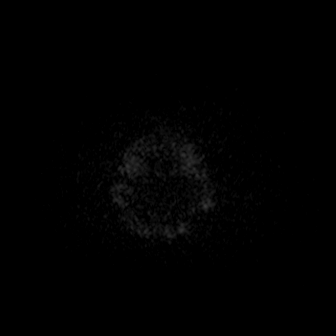

[Series 3: ax dwi_adc · axial · 3.0mm · 0.68mm/px · z∈[-82,+78]mm · 5 of 55 slices shown]
[im 1/55]
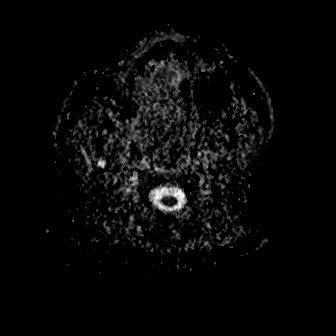
[im 14/55]
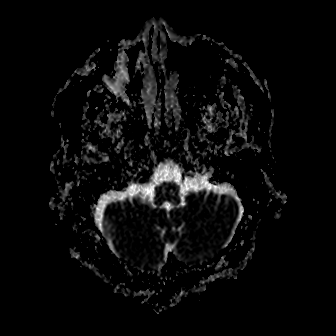
[im 28/55]
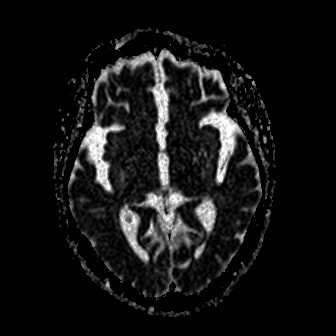
[im 41/55]
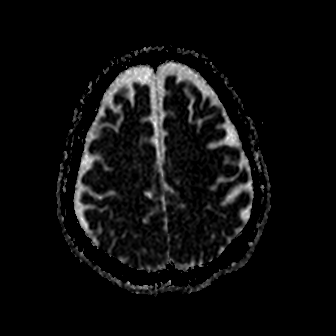
[im 55/55]
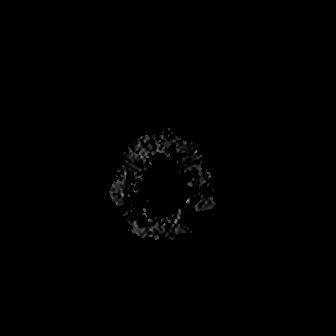

[Series 4: cor dwi_tracew · coronal · 5.0mm · 0.68mm/px · 4 of 38 slices shown]
[im 1/38]
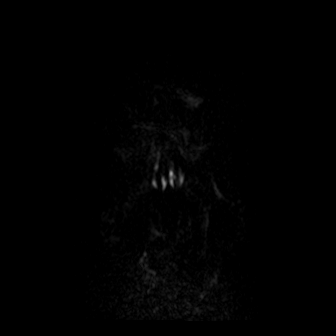
[im 13/38]
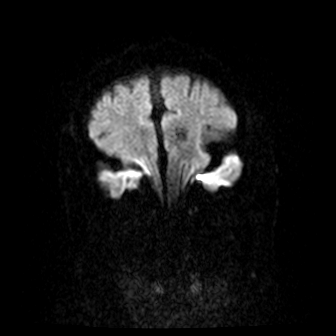
[im 25/38]
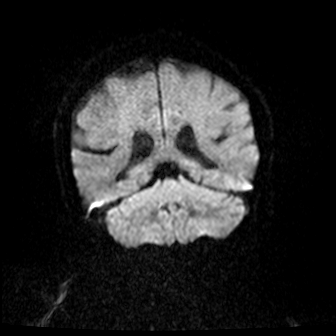
[im 38/38]
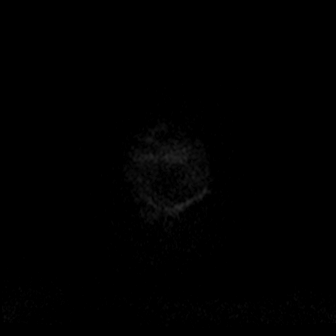

[Series 5: cor dwi_adc · coronal · 5.0mm · 0.68mm/px · 4 of 38 slices shown]
[im 1/38]
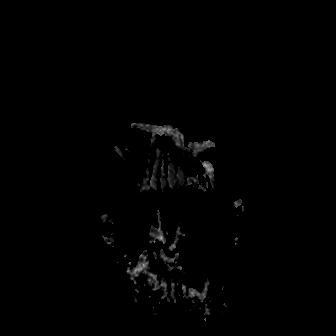
[im 13/38]
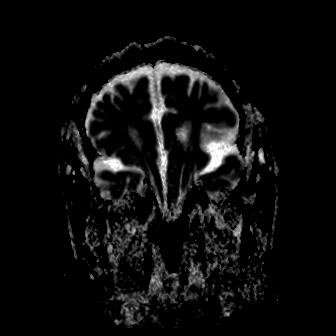
[im 25/38]
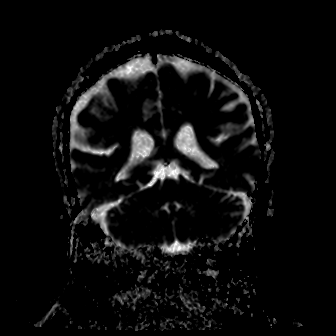
[im 38/38]
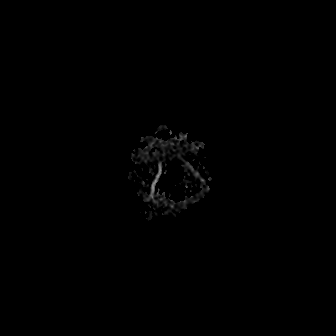

[Series 11: T1 · sagittal · 5.0mm · 0.94mm/px · 2 of 21 slices shown (1 of 2)]
[im 1/21]
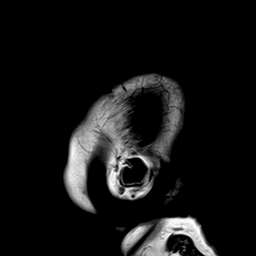
[im 21/21]
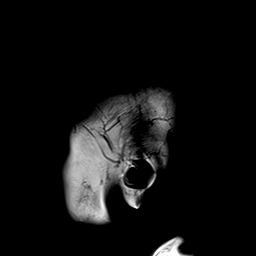

[Series 12: T2 · axial · 5.0mm · 0.45mm/px · z∈[-78,+74]mm · 3 of 27 slices shown (1 of 2)]
[im 1/27]
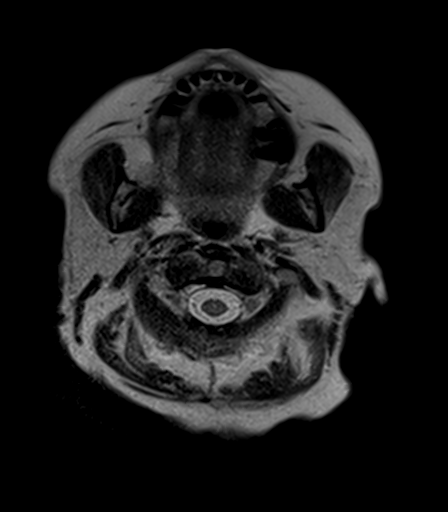
[im 14/27]
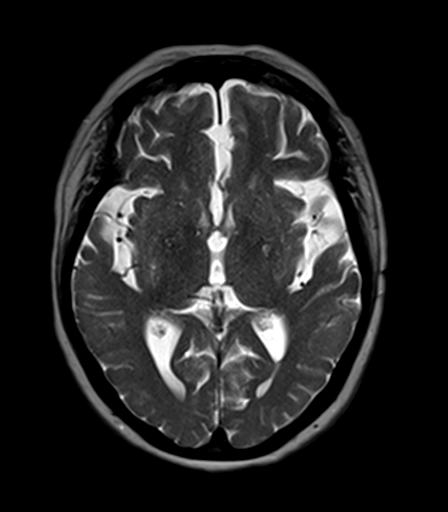
[im 27/27]
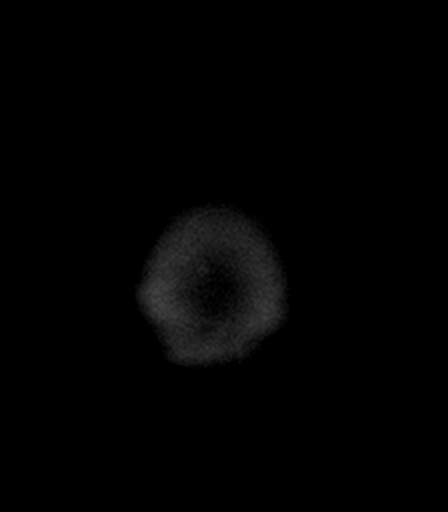

[Series 13: T2-star · axial · 5.0mm · 0.45mm/px · z∈[-78,+74]mm · 3 of 27 slices shown]
[im 1/27]
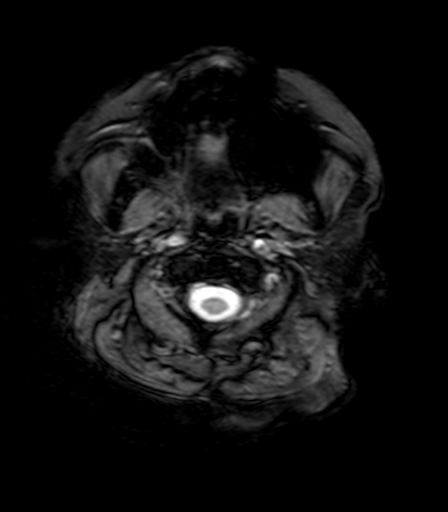
[im 14/27]
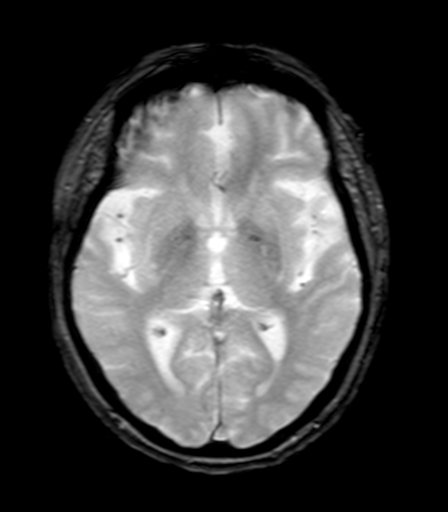
[im 27/27]
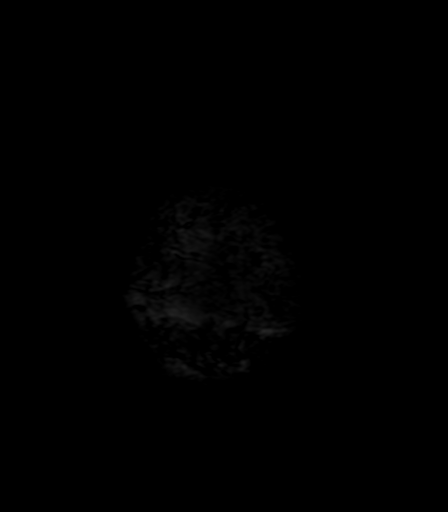

[Series 14: FLAIR · axial · 5.0mm · 1.20mm/px · z∈[-81,+71]mm · 3 of 27 slices shown]
[im 1/27]
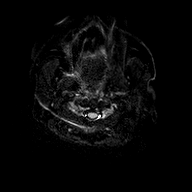
[im 14/27]
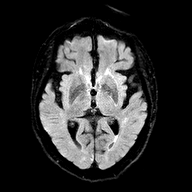
[im 27/27]
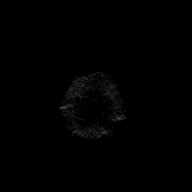

[Series 15: T1 · axial · 5.0mm · 0.90mm/px · z∈[-78,+74]mm · 3 of 27 slices shown (2 of 2)]
[im 1/27]
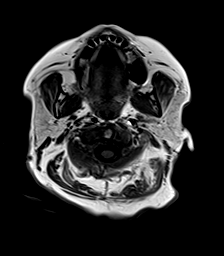
[im 14/27]
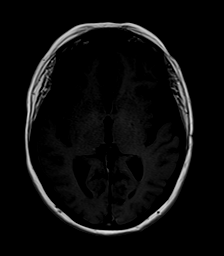
[im 27/27]
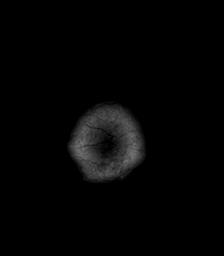

[Series 16: T2 · coronal · 5.0mm · 0.45mm/px · 3 of 31 slices shown (2 of 2)]
[im 1/31]
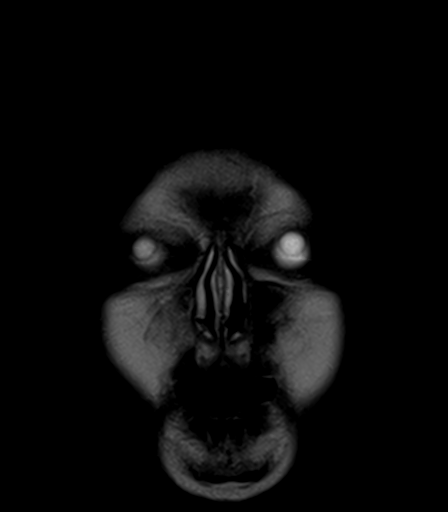
[im 16/31]
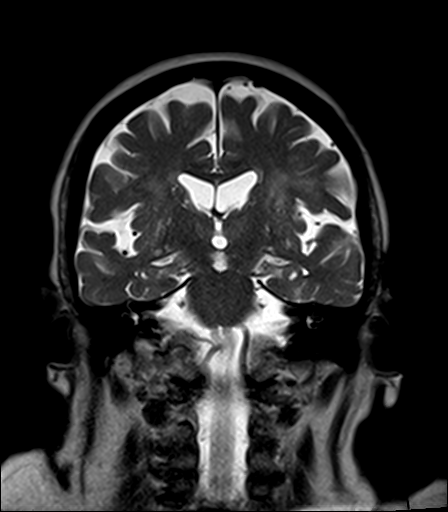
[im 31/31]
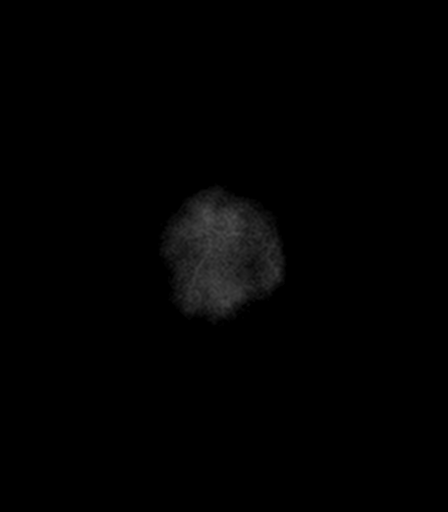

[34 of 48 positions shown; findings below may reference images not displayed]

FINDINGS: MRI HEAD FINDINGS

BRAIN: There is no acute infarct, acute hemorrhage or mass effect.
The midline structures are normal. Early confluent hyperintense
T2-weighted signal of the periventricular and deep white matter,
most commonly due to chronic ischemic microangiopathy. The CSF
spaces are normal for age, with no hydrocephalus.
Susceptibility-sensitive sequences show no chronic microhemorrhage
or superficial siderosis.

SKULL AND UPPER CERVICAL SPINE: The visualized skull base,
calvarium, upper cervical spine and extracranial soft tissues are
normal.

SINUSES/ORBITS: No fluid levels or advanced mucosal thickening. No
mastoid or middle ear effusion. The orbits are normal.

MRA HEAD FINDINGS

POSTERIOR CIRCULATION:

--Vertebral arteries: Visualized portions of the V4 segments are
normal.

--Posterior inferior cerebellar arteries (PICA): Incompletely
visualized.

--Anterior inferior cerebellar arteries (AICA): Not clearly
visualized, though this is not uncommon.

--Basilar artery: Normal.

--Superior cerebellar arteries: Normal.

--Posterior cerebral arteries (PCA): Fetal origin of the left PCA,
which is normal. There is multifocal severe stenosis of the right
PCA with loss of antegrade flow at multiple locations along the P1
segment.

ANTERIOR CIRCULATION:

--Intracranial internal carotid arteries: Normal.

--Anterior cerebral arteries (ACA): Normal. Both A1 segments are
present. Patent anterior communicating artery (a-comm).

--Middle cerebral arteries (MCA): Normal.
IMPRESSION: 1. No acute intracranial abnormality.
2. Chronic ischemic microangiopathy.
3. Multifocal severe stenosis and/or short segment occlusion of the
right posterior cerebral artery. Otherwise normal intracranial MRA.

## 2021-03-02 ENCOUNTER — Other Ambulatory Visit: Payer: Self-pay

## 2021-03-02 ENCOUNTER — Ambulatory Visit: Payer: Medicare HMO | Admitting: Neurology

## 2021-03-02 ENCOUNTER — Encounter: Payer: Self-pay | Admitting: Neurology

## 2021-03-02 VITALS — BP 119/73 | HR 66 | Ht 64.0 in | Wt 157.8 lb

## 2021-03-02 DIAGNOSIS — F03A0 Unspecified dementia, mild, without behavioral disturbance, psychotic disturbance, mood disturbance, and anxiety: Secondary | ICD-10-CM

## 2021-03-02 DIAGNOSIS — G43709 Chronic migraine without aura, not intractable, without status migrainosus: Secondary | ICD-10-CM | POA: Diagnosis not present

## 2021-03-02 MED ORDER — DONEPEZIL HCL 10 MG PO TABS: 10.0000 mg | ORAL_TABLET | Freq: Every day | ORAL | 3 refills | Status: DC

## 2021-03-02 NOTE — Progress Notes (Signed)
NEUROLOGY FOLLOW UP OFFICE NOTE  Shannon Higgins [id number] June 20, 1934  HISTORY OF PRESENT ILLNESS: I had the pleasure of seeing Shannon Higgins in follow-up in the neurology clinic on 03/02/2021.  The patient was last seen 7 months ago for migraines and mild dementia. She is again accompanied by her daughter Shannon Higgins who helps supplement the history today.  Records and images were personally reviewed where available.  She has Botox for migraine prophylaxis, last session was in 12/2020. She has had good response to Botox but reports that this month she has had more migraines, the last 2-3 days she had a bad migraine. Prior to this, migraines were not bothering her as much. She is on Propranolol 31m qhs for migraine prophylaxis as well. She takes gabapentin for neuropathy and back pain, KMaudie Mercuryreports that she has not been complaining as much of the burning and stinging in her legs, she agrees. She feels her memory is pretty good. Shannon Higgins fixes her medications, sometimes she forgets to take them. She manages finances and very rarely forgets a bProduct manager She gets a little confused who she will see for doctor appointments. She does not drive. Sleep is good with medications. She slid off her bed 2-3 times the past month, she does not think of them as falls.    History on Initial Assessment 02/08/2017: This is a 86year old right-handed woman with a history of hypertension, diabetes, rheumatoid arthritis, breast cancer, presenting for evaluation of chronic headaches and confusion.    1. Headaches Headaches started in her 322s She went for a period of time with no significant headaches for a year, then last June headaches recurred lasting for weeks at a time. She has 20 to 25 headache days a month, with nausea, upset stomach, sometimes diarrhea. She describes a lot of pressure and throbbing. She had a headache that lasted for 2 months, resolving 2 weeks ago, then this weekend headaches started back and have been  ongoing for 4-5 days. She denies any visual obscurations, no photo/phonophobia. She does not usually have dizziness with the headaches, but recently has been so dizzy she had difficulty getting to the bathroom the past week, with described spinning sensation. Meclizine helps some. She has had vertigo in the past. No family history of migraines. She has tried several headache preventative medications, including amitriptyline, Topiramate. She is taking Propranolol 844mdaily with no side effects. She is also on Neurontin for back pain and Cymbalta for mood.    2. Confusion. She reports word-finding difficulties for the past 6 months. Her daughter mostly describes the episodic confusion as forgetting what she had previously told the patient. She has been taking an old meclizine prescription, but forgot that her daughter told her this information. She has not been driving for the past month due to glaucoma, denied previously getting lost driving. After her husband passed away 1.5 years ago, she moved in with her daughter and son-in-law. They are in charge of finances. Her daughter states she forgets her medications. One time she forgot to take her Cymbalta for a week. No paranoia or hallucinations. She used to be a social butterfly but now likes to be more at home. There is a strong family history of dementia in her mother and multiple siblings.    She denies any diplopia, dysarthria/dysphagia, anosmia, or tremors. She has chronic neck and back pain and neuropathy in both feet radiating up her legs. She has a little urinary incontinence, no bowel issues. They report  several falls with the dizziness. After she hit her head with fall in July, she had a bad headache for 6 weeks. She was veering to the left and went to the ER on 7/13 where head CT did not show any acute changes. She got a migraine cocktail but headache was still bad the next day.     PAST MEDICAL HISTORY: Past Medical History:  Diagnosis Date    Acute metabolic encephalopathy 93/71/6967   Acute tubular injury of transplanted kidney ([place]) 06/14/2016   AKI (acute kidney injury) ([place]) 06/14/2016   Asthma    Breast cancer ([place]) 2015   left   Bronchitis    CAP (community acquired pneumonia) 04/29/2012   Chronic sinusitis    Closed fracture of medial malleolus 05/11/2018   Confusion 12/06/2016   Diabetes mellitus without complication (HCC)    Family history of breast cancer    GERD (gastroesophageal reflux disease)    History of ankle fracture 05/14/2018   Last Assessment & Plan:  With a recent fall. Right medial malleous. Has ortho follow up soon, splinted now and tylenol helps her pain   History of recurrent UTIs    Hypertension    Hypokalemia 04/26/2012   Hyponatremia    Hypothyroidism    Imbalance 09/02/2019   Insomnia    Migraine    Neuropathic pain    Ovarian cancer ([place]) 2022   Ovarian mass, left 07/14/2020   Personal history of breast cancer    Personal history of chemotherapy current   bilateral ovarian ca   Pneumonia    Rheumatoid arthritis ([place])    Sepsis ([place]) 04/26/2012   Sepsis secondary to UTI ([place]) 04/26/2012   Sleep apnea    Stroke [facility])    seen in CT scan   Thyroid disease    Transient hypotension 06/14/2016    MEDICATIONS: Current Outpatient Medications on File Prior to Visit  Medication Sig Dispense Refill   Accu-Chek FastClix Lancets MISC USE AS INSTRUCTED TO TEST BLOOD SUGAR DAILY 102 each 1   ACCU-CHEK GUIDE test strip USE AS INSTRUCTED TO TEST BLOOD SUGAR DAILY 100 strip 1   aspirin EC 325 MG tablet Take 325 mg by mouth daily.     blood glucose meter kit and supplies Dispense based on patient and insurance preference. Use up to 2 times daily as directed. (FOR ICD-10 E10.9, E11.9). 1 each 0   BOTOX 200 units SOLR Inject 1 vial into the muscle every 3 (three) months.     Cranberry 1000 MG CAPS Take 1 capsule by mouth 2 (two) times daily.     donepezil (ARICEPT) 10 MG tablet Take 1 tablet (10 mg total)  by mouth at bedtime. 90 tablet 3   dorzolamide-timolol (COSOPT) 22.3-6.8 MG/ML ophthalmic solution Place 1 drop into both eyes 2 (two) times daily.     DULoxetine (CYMBALTA) 30 MG capsule Take 1 capsule (30 mg total) by mouth daily. For depression and pain. 90 capsule 3   fluticasone (FLONASE) 50 MCG/ACT nasal spray Place 2 sprays into both nostrils daily as needed for allergies or rhinitis. 48 g 0   gabapentin (NEURONTIN) 800 MG tablet TAKE 1 TABLET BY MOUTH 3 TIMES A DAY FOR NEUROPATHY 270 tablet 1   latanoprost (XALATAN) 0.005 % ophthalmic solution Place 1 drop into both eyes at bedtime.     levothyroxine (SYNTHROID) 112 MCG tablet TAKE 1 TABLET EVERY MORNING ON AN EMPTY STOMACH WITH WATER ONLY.  NO FOOD OR OTHER MEDICATIONS FOR 30 MINUTES. (Patient  taking differently: TAKE 1 TABLET EVERY MORNING ON AN EMPTY STOMACH WITH WATER ONLY.  NO FOOD OR OTHER MEDICATIONS FOR 30 MINUTES.) 90 tablet 1   meclizine (ANTIVERT) 25 MG tablet TAKE 1 TABLET (25 MG TOTAL) BY MOUTH 2 (TWO) TIMES DAILY AS NEEDED FOR DIZZINESS. 180 tablet 0   meloxicam (MOBIC) 15 MG tablet TAKE 1 TABLET DAILY AS NEEDED FOR PAIN. 90 tablet 0   Netarsudil Dimesylate (RHOPRESSA) 0.02 % SOLN Place 1 drop into both eyes daily at 6 (six) AM.     omeprazole (PRILOSEC) 20 MG capsule TAKE 1 CAPSULE TWICE DAILY FOR HEARTBURN. 180 capsule 1   ondansetron (ZOFRAN) 8 MG tablet Take 1 tablet (8 mg total) by mouth 2 (two) times daily as needed for refractory nausea / vomiting. Start on day 3 after carboplatin chemo. 30 tablet 1   pilocarpine (PILOCAR) 2 % ophthalmic solution Place 1 drop into both eyes 4 (four) times daily.      propranolol (INDERAL) 80 MG tablet Take 1 tablet (80 mg total) by mouth daily. For headache prevention. 90 tablet 1   rosuvastatin (CRESTOR) 10 MG tablet TAKE 1 TABLET DAILY FOR CHOLESTEROL. 90 tablet 1   traZODone (DESYREL) 100 MG tablet Take 2 tablets (200 mg total) by mouth at bedtime as needed for sleep. 180 tablet 1    No current facility-administered medications on file prior to visit.    ALLERGIES: No Known Allergies  FAMILY HISTORY: Family History  Problem Relation Age of Onset   Diabetes Daughter    Hypertension Daughter    Fibromyalgia Daughter    GER disease Daughter    Fibromyalgia Daughter    Crohn's disease Daughter    Asthma Daughter    Hypertension Mother    Breast cancer Other    Breast cancer Niece    Uterine cancer Niece     SOCIAL HISTORY: Social History   Socioeconomic History   Marital status: Widowed    Spouse name: Not on file   Number of children: 2   Years of education: Not on file   Highest education level: Not on file  Occupational History   Occupation: retired   Tobacco Use   Smoking status: Never   Smokeless tobacco: Never  Vaping Use   Vaping Use: Never used  Substance and Sexual Activity   Alcohol use: No   Drug use: No   Sexual activity: Not Currently  Other Topics Concern   Not on file  Social History Narrative   Pt lives in 1 story home with her daughter, Shannon Higgins and Shannon Higgins's husband   Has 2 adult daughters   Highest level of education: some college   Worked mainly as Web designer.   Social Determinants of Health   Financial Resource Strain: Not on file  Food Insecurity: Not on file  Transportation Needs: Not on file  Physical Activity: Not on file  Stress: Not on file  Social Connections: Not on file  Intimate Partner Violence: Not on file     PHYSICAL EXAM: Vitals:   03/02/21 1137  BP: 119/73  Pulse: 66  SpO2: 95%   General: No acute distress Head:  Normocephalic/atraumatic Skin/Extremities: No rash, no edema Neurological Exam: alert and oriented to person, place, and time. No aphasia or dysarthria. Fund of knowledge is appropriate.  Recent and remote memory are intact.  Attention and concentration are normal. MMSE 28/30 MMSE - Mini Mental State Exam 03/02/2021   Orientation to time 5   Orientation to Place 5  Registration 3   Attention/ Calculation 4   Recall 3   Language- name 2 objects 2   Language- repeat 1   Language- follow 3 step command 2   Language- read & follow direction 1   Write a sentence 1   Copy design 1   Total score 28    Cranial nerves: Pupils equal, round. Extraocular movements intact with no nystagmus. Visual fields full.  No facial asymmetry.  Motor: Bulk and tone normal, muscle strength 5/5 throughout with no pronator drift.   Finger to nose testing intact.  Gait slow and cautious, no ataxia. No tremors.   IMPRESSION: This is an 86 yo RH woman with a history of hypertension, diabetes, rheumatoid arthritis, breast cancer, with chronic migraines and mild dementia. She has had good response to Botox however this past month notes more migraines. Next Botox session is next month. Since migraines are more, would stay on current dose of Propranolol 62m daily as well. She is also on gabapentin for neuropathy and back pain, which we discussed is also used for headache prophylaxis. Memory overall stable, MMSE today 28/30, continue Donepezil 131mdaily. Follow-up in 8 months, they know to call for any changes.   Thank you for allowing me to participate in her care.  Please do not hesitate to call for any questions or concerns.    KaEllouise NewerM.D.   CC: KaAlma FriendlyNP

## 2021-03-02 NOTE — Patient Instructions (Signed)
Always good to see you! Continue Donepezil 10mg  daily. Continue Botox and all your other medications. Follow-up in 8 months, call for any changes

## 2021-03-03 ENCOUNTER — Telehealth: Payer: Self-pay | Admitting: Oncology

## 2021-03-03 NOTE — Telephone Encounter (Signed)
Daughter called to cancel appt on 11-7. Wants to reschedule for after Thanksgiving. Please call her at [phone]

## 2021-03-04 ENCOUNTER — Other Ambulatory Visit: Payer: Self-pay | Admitting: *Deleted

## 2021-03-04 ENCOUNTER — Telehealth: Payer: Self-pay | Admitting: *Deleted

## 2021-03-04 ENCOUNTER — Encounter: Payer: Self-pay | Admitting: *Deleted

## 2021-03-04 DIAGNOSIS — C563 Malignant neoplasm of bilateral ovaries: Secondary | ICD-10-CM

## 2021-03-04 DIAGNOSIS — Z95828 Presence of other vascular implants and grafts: Secondary | ICD-10-CM

## 2021-03-04 NOTE — Telephone Encounter (Signed)
I had sent my chart message about the portatcath to take out. Dr. Janese Banks says yes it can come out. I put referral in for vascular to make an appt to get it out. I tried calling [name] - daughter and her voicemail is full and can't leave a message so I called the pt. And let her know that I put in referral to get port out through [facility]. She will get a date and time from vascular and she will go to hospital and get port removed at the same place where she had got it put in. She was ok and I told her to call back if someone from vascular does not call after a week.

## 2021-03-08 ENCOUNTER — Inpatient Hospital Stay: Payer: Medicare HMO | Admitting: Oncology

## 2021-03-08 ENCOUNTER — Inpatient Hospital Stay: Payer: Medicare HMO

## 2021-03-09 ENCOUNTER — Telehealth (INDEPENDENT_AMBULATORY_CARE_PROVIDER_SITE_OTHER): Payer: Self-pay

## 2021-03-09 NOTE — Telephone Encounter (Signed)
Spoke with the patient's daughter [name] and the patient is scheduled with Dr. Lucky Cowboy for a port removal on 03/29/21 with a 6:45 am arrival time to the MM. Pre-procedure instructions were discussed and will be mailed.

## 2021-03-18 ENCOUNTER — Other Ambulatory Visit: Payer: Self-pay

## 2021-03-18 DIAGNOSIS — G43709 Chronic migraine without aura, not intractable, without status migrainosus: Secondary | ICD-10-CM

## 2021-03-18 MED ORDER — BOTOX 200 UNITS IJ SOLR: INTRAMUSCULAR | 4 refills | Status: DC

## 2021-03-18 NOTE — Progress Notes (Signed)
Telephone call to [facility] please send over a new script for Botox. One on file has expired.  New script sent in.  Per rep once received they will process and give [place] a call to schedule delivery.

## 2021-03-23 ENCOUNTER — Ambulatory Visit (INDEPENDENT_AMBULATORY_CARE_PROVIDER_SITE_OTHER): Payer: Medicare HMO | Admitting: Gastroenterology

## 2021-03-23 ENCOUNTER — Telehealth: Payer: Self-pay

## 2021-03-23 ENCOUNTER — Encounter: Payer: Self-pay | Admitting: Gastroenterology

## 2021-03-23 ENCOUNTER — Telehealth (INDEPENDENT_AMBULATORY_CARE_PROVIDER_SITE_OTHER): Payer: Self-pay

## 2021-03-23 VITALS — BP 149/80 | HR 69 | Ht 64.0 in | Wt 162.0 lb

## 2021-03-23 DIAGNOSIS — R1319 Other dysphagia: Secondary | ICD-10-CM

## 2021-03-23 DIAGNOSIS — M069 Rheumatoid arthritis, unspecified: Secondary | ICD-10-CM | POA: Diagnosis not present

## 2021-03-23 NOTE — Progress Notes (Signed)
Gastroenterology Consultation  Referring Provider:     Pleas Koch, NP Primary Care Physician:  Pleas Koch, NP Primary Gastroenterologist:  Dr. Allen Norris     Reason for Consultation:     GERD        HPI:   Shannon Higgins is a 86 y.o. y/o female referred for consultation & management of GERD by Dr. Carlis Abbott, Leticia Penna, NP.  This patient comes in today with a history of ovarian cancer and being followed by hematology.  The patient had an EGD and colonoscopy in 2012 with dilation of the pylorus done with balloon.  The patient was reported to have dysphagia at that time. The patient reports that she is now taking her PPI every day and has no further signs of dysphagia or any other complaints.  The patient's GERD is well-controlled.  Past Medical History:  Diagnosis Date   Acute metabolic encephalopathy 79/89/2119   Acute tubular injury of transplanted kidney ([place]) 06/14/2016   AKI (acute kidney injury) ([place]) 06/14/2016   Asthma    Breast cancer ([place]) 2015   left   Bronchitis    CAP (community acquired pneumonia) 04/29/2012   Chronic sinusitis    Closed fracture of medial malleolus 05/11/2018   Confusion 12/06/2016   Diabetes mellitus without complication (HCC)    Family history of breast cancer    GERD (gastroesophageal reflux disease)    History of ankle fracture 05/14/2018   Last Assessment & Plan:  With a recent fall. Right medial malleous. Has ortho follow up soon, splinted now and tylenol helps her pain   History of recurrent UTIs    Hypertension    Hypokalemia 04/26/2012   Hyponatremia    Hypothyroidism    Imbalance 09/02/2019   Insomnia    Migraine    Neuropathic pain    Ovarian cancer ([place]) 2022   Ovarian mass, left 07/14/2020   Personal history of breast cancer    Personal history of chemotherapy current   bilateral ovarian ca   Pneumonia    Rheumatoid arthritis ([place])    Sepsis ([place]) 04/26/2012   Sepsis secondary to UTI ([place]) 04/26/2012   Sleep apnea     Stroke [facility])    seen in CT scan   Thyroid disease    Transient hypotension 06/14/2016    Past Surgical History:  Procedure Laterality Date   ABDOMINAL HYSTERECTOMY     BACK SURGERY     BREAST BIOPSY Right ?`   benign   BREAST LUMPECTOMY Left 2015   breast ca   CHOLECYSTECTOMY     JOINT REPLACEMENT     LAPAROTOMY N/A 07/14/2020   Procedure: LAPAROTOMY;  Surgeon: Gae Dry, MD;  Location: [facility] ORS;  Service: Gynecology;  Laterality: N/A;   OOPHORECTOMY     OVARIAN CYST REMOVAL     PORTA CATH INSERTION N/A 07/27/2020   Procedure: PORTA CATH INSERTION;  Surgeon: Algernon Huxley, MD;  Location: [facility];  Service: Cardiovascular;  Laterality: N/A;   REPLACEMENT TOTAL KNEE Right    SALPINGOOPHORECTOMY Bilateral 07/14/2020   Procedure: OPEN SALPINGO OOPHORECTOMY;  Surgeon: Gae Dry, MD;  Location: [facility] ORS;  Service: Gynecology;  Laterality: Bilateral;   TOTAL SHOULDER REPLACEMENT Left     Prior to Admission medications   Medication Sig Start Date End Date Taking? Authorizing Provider  Botulinum Toxin Type A (BOTOX) 200 units SOLR Inject 155 units IM into multiple sites of face head and neck every 90 days  03/18/21   Pieter Partridge, DO  Accu-Chek FastClix Lancets MISC USE AS INSTRUCTED TO TEST BLOOD SUGAR DAILY 10/14/20   Pleas Koch, NP  ACCU-CHEK GUIDE test strip USE AS INSTRUCTED TO TEST BLOOD SUGAR DAILY 10/14/20   Pleas Koch, NP  aspirin EC 325 MG tablet Take 325 mg by mouth daily.    [provider]  blood glucose meter kit and supplies Dispense based on patient and insurance preference. Use up to 2 times daily as directed. (FOR ICD-10 E10.9, E11.9). 03/04/19   Pleas Koch, NP  BOTOX 200 units SOLR Inject 1 vial into the muscle every 3 (three) months. 02/28/20   [provider]  Cranberry 1000 MG CAPS Take 1 capsule by mouth 2 (two) times daily.    [provider]  donepezil (ARICEPT) 10 MG tablet Take 1 tablet (10 mg  total) by mouth at bedtime. 03/02/21   Cameron Sprang, MD  dorzolamide-timolol (COSOPT) 22.3-6.8 MG/ML ophthalmic solution Place 1 drop into both eyes 2 (two) times daily.    [provider]  DULoxetine (CYMBALTA) 30 MG capsule Take 1 capsule (30 mg total) by mouth daily. For depression and pain. 01/15/21   Pleas Koch, NP  fluticasone (FLONASE) 50 MCG/ACT nasal spray Place 2 sprays into both nostrils daily as needed for allergies or rhinitis. 10/14/20   Pleas Koch, NP  gabapentin (NEURONTIN) 800 MG tablet TAKE 1 TABLET BY MOUTH 3 TIMES A DAY FOR NEUROPATHY 07/03/20   Pleas Koch, NP  latanoprost (XALATAN) 0.005 % ophthalmic solution Place 1 drop into both eyes at bedtime.    [provider]  levothyroxine (SYNTHROID) 112 MCG tablet TAKE 1 TABLET EVERY MORNING ON AN EMPTY STOMACH WITH WATER ONLY.  NO FOOD OR OTHER MEDICATIONS FOR 30 MINUTES. Patient taking differently: TAKE 1 TABLET EVERY MORNING ON AN EMPTY STOMACH WITH WATER ONLY.  NO FOOD OR OTHER MEDICATIONS FOR 30 MINUTES. 05/13/20   Pleas Koch, NP  meclizine (ANTIVERT) 25 MG tablet TAKE 1 TABLET (25 MG TOTAL) BY MOUTH 2 (TWO) TIMES DAILY AS NEEDED FOR DIZZINESS. 05/14/20   Pleas Koch, NP  meloxicam (MOBIC) 15 MG tablet TAKE 1 TABLET DAILY AS NEEDED FOR PAIN. 02/04/21   Pleas Koch, NP  Netarsudil Dimesylate (RHOPRESSA) 0.02 % SOLN Place 1 drop into both eyes daily at 6 (six) AM. 12/04/18   [provider]  omeprazole (PRILOSEC) 20 MG capsule TAKE 1 CAPSULE TWICE DAILY FOR HEARTBURN. 10/14/20   Pleas Koch, NP  ondansetron (ZOFRAN) 8 MG tablet Take 1 tablet (8 mg total) by mouth 2 (two) times daily as needed for refractory nausea / vomiting. Start on day 3 after carboplatin chemo. 07/27/20   Sindy Guadeloupe, MD  pilocarpine (PILOCAR) 2 % ophthalmic solution Place 1 drop into both eyes 4 (four) times daily.  02/19/19   [provider]  propranolol (INDERAL) 80 MG tablet  Take 1 tablet (80 mg total) by mouth daily. For headache prevention. 12/03/20   Pleas Koch, NP  rosuvastatin (CRESTOR) 10 MG tablet TAKE 1 TABLET DAILY FOR CHOLESTEROL. 12/03/20   Pleas Koch, NP  traZODone (DESYREL) 100 MG tablet Take 2 tablets (200 mg total) by mouth at bedtime as needed for sleep. 11/18/20   Pleas Koch, NP    Family History  Problem Relation Age of Onset   Diabetes Daughter    Hypertension Daughter    Fibromyalgia Daughter    GER  disease Daughter    Fibromyalgia Daughter    Crohn's disease Daughter    Asthma Daughter    Hypertension Mother    Breast cancer Other    Breast cancer Niece    Uterine cancer Niece      Social History   Tobacco Use   Smoking status: Never   Smokeless tobacco: Never  Vaping Use   Vaping Use: Never used  Substance Use Topics   Alcohol use: No   Drug use: No    Allergies as of 03/23/2021   (No Known Allergies)    Review of Systems:    All systems reviewed and negative except where noted in HPI.   Physical Exam:  There were no vitals taken for this visit. No LMP recorded. Patient has had a hysterectomy. General:   Alert,  Well-developed, well-nourished, pleasant and cooperative in NAD Head:  Normocephalic and atraumatic. Eyes:  Sclera clear, no icterus.   Conjunctiva pink. Ears:  Normal auditory acuity. Neck:  Supple; no masses or thyromegaly. Lungs:  Respirations even and unlabored.  Clear throughout to auscultation.   No wheezes, crackles, or rhonchi. No acute distress. Heart:  Regular rate and rhythm; no murmurs, clicks, rubs, or gallops. Abdomen:  Normal bowel sounds.  No bruits.  Soft, non-tender and non-distended without masses, hepatosplenomegaly or hernias noted.  No guarding or rebound tenderness.  Negative Carnett sign.   Rectal:  Deferred.  Pulses:  Normal pulses noted. Extremities:  No clubbing or edema.  No cyanosis. Neurologic:  Alert and oriented x3;  grossly normal neurologically. Skin:   Intact without significant lesions or rashes.  No jaundice. Lymph Nodes:  No significant cervical adenopathy. Psych:  Alert and cooperative. Normal mood and affect.  Imaging Studies: No results found.  Assessment and Plan:   Shannon Higgins is a 86 y.o. y/o female who comes in today with a history of dysphagia that she states has completely resolved.  She is not having any symptoms at the present time.  The patient has been told that if she has dysphagia again she should contact me but for now we will hold off on any endoscopic procedures since she is feeling fine.  The patient has been explained the plan and agrees with it.    Lucilla Lame, MD. Marval Regal    Note: This dictation was prepared with Dragon dictation along with smaller phrase technology. Any transcriptional errors that result from this process are unintentional.

## 2021-03-23 NOTE — Telephone Encounter (Signed)
I attempted to contact the patient due to returning a call. Patient called and left a message to cancel her port removal with Dr. Lucky Cowboy on 03/29/21 and wanted a callback to let her know I received the message. The patient's mailbox is full and I am unable to leave a message.

## 2021-03-23 NOTE — Telephone Encounter (Signed)
New message   I called the patient at  [phone] and spoke with the daughter advising her to contact  [facility] for Botox consent.   Upcoming appt on 04/16/21

## 2021-03-29 ENCOUNTER — Ambulatory Visit: Admission: RE | Admit: 2021-03-29 | Payer: Medicare HMO | Source: Home / Self Care | Admitting: Vascular Surgery

## 2021-03-29 ENCOUNTER — Telehealth: Payer: Self-pay | Admitting: Neurology

## 2021-03-29 ENCOUNTER — Encounter: Admission: RE | Payer: Self-pay | Source: Home / Self Care

## 2021-03-29 DIAGNOSIS — C569 Malignant neoplasm of unspecified ovary: Secondary | ICD-10-CM

## 2021-03-29 SURGERY — PORTA CATH REMOVAL
Anesthesia: Moderate Sedation

## 2021-03-29 NOTE — Telephone Encounter (Signed)
Pt called and informed that she should still give it another try per Dr Tomi Likens.  per Dr Tomi Likens I wouldn't change management based on one injection when it has previously been effective for so long. Pt stated that she will keep her December appt for botox

## 2021-03-29 NOTE — Telephone Encounter (Signed)
Pt called in stating her Botox has stopped working. It seems like her last injection didn't work. She wants to see if Dr. Tomi Likens wants to have her try something different or keep her 04/16/21 botox appointment?

## 2021-03-30 NOTE — Telephone Encounter (Signed)
F/u  I called [facility] at [phone] and advised the patient has a copayment of $ 339.19.   A  target date to ship Botox medication is between 12.6.2022 to 12.9.2022.   I call the patient daughter [name] at [phone] the patient will be calling the office and discussing with MD/ CMA  felt the Botox is not working if the needs to proceed with botox the patient will call [facility] to pay a copayment and schedule a delivery date.

## 2021-03-31 ENCOUNTER — Encounter: Payer: Self-pay | Admitting: Oncology

## 2021-03-31 ENCOUNTER — Inpatient Hospital Stay (HOSPITAL_BASED_OUTPATIENT_CLINIC_OR_DEPARTMENT_OTHER): Payer: Medicare HMO | Admitting: Oncology

## 2021-03-31 ENCOUNTER — Inpatient Hospital Stay: Payer: Medicare HMO | Attending: Oncology

## 2021-03-31 ENCOUNTER — Other Ambulatory Visit: Payer: Self-pay

## 2021-03-31 VITALS — BP 138/72 | HR 55 | Temp 98.4°F | Resp 16 | Ht 64.0 in | Wt 160.9 lb

## 2021-03-31 DIAGNOSIS — Z08 Encounter for follow-up examination after completed treatment for malignant neoplasm: Secondary | ICD-10-CM

## 2021-03-31 DIAGNOSIS — Z8543 Personal history of malignant neoplasm of ovary: Secondary | ICD-10-CM | POA: Diagnosis not present

## 2021-03-31 DIAGNOSIS — C563 Malignant neoplasm of bilateral ovaries: Secondary | ICD-10-CM

## 2021-03-31 DIAGNOSIS — E039 Hypothyroidism, unspecified: Secondary | ICD-10-CM | POA: Insufficient documentation

## 2021-03-31 DIAGNOSIS — Z95828 Presence of other vascular implants and grafts: Secondary | ICD-10-CM

## 2021-03-31 LAB — COMPREHENSIVE METABOLIC PANEL
ALT: 13 U/L (ref 0–44)
AST: 22 U/L (ref 15–41)
Albumin: 4 g/dL (ref 3.5–5.0)
Alkaline Phosphatase: 65 U/L (ref 38–126)
Anion gap: 12 (ref 5–15)
BUN: 13 mg/dL (ref 8–23)
CO2: 30 mmol/L (ref 22–32)
Calcium: 9.4 mg/dL (ref 8.9–10.3)
Chloride: 98 mmol/L (ref 98–111)
Creatinine, Ser: 0.95 mg/dL (ref 0.44–1.00)
GFR, Estimated: 59 mL/min — ABNORMAL LOW (ref >60)
Glucose, Bld: 143 mg/dL — ABNORMAL HIGH (ref 70–99)
Potassium: 3.8 mmol/L (ref 3.5–5.1)
Sodium: 140 mmol/L (ref 135–145)
Total Bilirubin: 0.4 mg/dL (ref 0.3–1.2)
Total Protein: 7.2 g/dL (ref 6.5–8.1)

## 2021-03-31 LAB — CBC WITH DIFFERENTIAL/PLATELET
Abs Immature Granulocytes: 0.04 10*3/uL (ref 0.00–0.07)
Basophils Absolute: 0 10*3/uL (ref 0.0–0.1)
Basophils Relative: 0 %
Eosinophils Absolute: 0.2 10*3/uL (ref 0.0–0.5)
Eosinophils Relative: 3 %
HCT: 38.4 % (ref 36.0–46.0)
Hemoglobin: 12.5 g/dL (ref 12.0–15.0)
Immature Granulocytes: 1 %
Lymphocytes Relative: 24 %
Lymphs Abs: 2.2 10*3/uL (ref 0.7–4.0)
MCH: 31.3 pg (ref 26.0–34.0)
MCHC: 32.6 g/dL (ref 30.0–36.0)
MCV: 96 fL (ref 80.0–100.0)
Monocytes Absolute: 0.8 10*3/uL (ref 0.1–1.0)
Monocytes Relative: 9 %
Neutro Abs: 5.6 10*3/uL (ref 1.7–7.7)
Neutrophils Relative %: 63 %
Platelets: 213 10*3/uL (ref 150–400)
RBC: 4 MIL/uL (ref 3.87–5.11)
RDW: 13.6 % (ref 11.5–15.5)
WBC: 8.8 10*3/uL (ref 4.0–10.5)
nRBC: 0 % (ref 0.0–0.2)

## 2021-03-31 MED ORDER — HEPARIN SOD (PORK) LOCK FLUSH 100 UNIT/ML IV SOLN
500.0000 [IU] | Freq: Once | INTRAVENOUS | Status: AC
  Administered 2021-03-31: 500 [IU] via INTRAVENOUS
  Filled 2021-03-31: qty 5

## 2021-03-31 MED ORDER — SODIUM CHLORIDE 0.9% FLUSH
10.0000 mL | Freq: Once | INTRAVENOUS | Status: AC
  Administered 2021-03-31: 10 mL via INTRAVENOUS
  Filled 2021-03-31: qty 10

## 2021-03-31 NOTE — Progress Notes (Signed)
Notes in f/u section

## 2021-04-01 LAB — CA 125: Cancer Antigen (CA) 125: 14.7 U/mL (ref 0.0–38.1)

## 2021-04-02 NOTE — Telephone Encounter (Signed)
F/u   I called [facility] at [phone] and advised the patient has not paid her copayment of $339.19.   The target date to ship Botox medication is between  12.6.2022 to  12.9.2022  I called the patient at  [phone] and left a detailed voicemail to contact [facility].   The patient has an upcoming appt on  12.26.2022 with Dr. Tomi Likens.

## 2021-04-04 ENCOUNTER — Other Ambulatory Visit (INDEPENDENT_AMBULATORY_CARE_PROVIDER_SITE_OTHER): Payer: Self-pay | Admitting: Nurse Practitioner

## 2021-04-04 ENCOUNTER — Encounter: Payer: Self-pay | Admitting: Oncology

## 2021-04-04 NOTE — H&P (View-Only) (Signed)
Hematology/Oncology Consult note [facility]  Telephone:([phone] [phone]  Patient Care Team: Pleas Koch, NP as PCP - General (Internal Medicine) Cameron Sprang, MD as Consulting Physician (Neurology) Clent Jacks, RN as Oncology Nurse Navigator Charlton Haws, [facility] as Pharmacist (Pharmacist) Cameron Sprang, MD as Consulting Physician (Neurology)   Name of the patient: Shannon Higgins  [id number]  02/07/1935   Date of visit: 04/04/21  Diagnosis- high-grade serous carcinoma of the ovary FIGO stage IC pT1 cpNX cMX s/p bilateral salpingo-oophorectomy    Chief complaint/ Reason for visit-routine follow-up of ovarian cancer  Heme/Onc history: patient is a 86 year old female with a past medical history significant for type 2 diabetes, UTIs, hypertension GERD among other medical problems.  She will is diagnosed with UTI and as a part of her Work-up had CT abdomen and pelvis with contrast.  That showed a 13.5 11.8 x 15.2 cm simple cystic appearing area within the pelvis.  Ultrasound of the pelvis showed 17 x 10.7 x 11.9 cm complex cystic midline pelvic mass.  Findings indeterminate and could reflect a cystic ovarian neoplasm.  She did have CA-125 checked which was normal at 22.9 but HD4 was elevated at 99.  Patient underwent bilateral salpingo-oophorectomy.  She has had prior hysterectomy.  Pathology showed high-grade serous carcinoma involving both ovaries but no involvement of the fallopian tube.  Omentum was negative for malignancy.  Lymph nodes not sampled.  Tumor size 4 cm high-grade.  FIGO stage IC peritoneal/ascitic fluid involvement was not identified.  Patient has baseline neuropathy. Therefore carbo/doxil chosen for adjuvant chemotherapy upto 6 cycles if tolerated.   Patient completed 4 cycles of Carbo doxil chemotherapy in July 2022 but could not tolerate further doses and was therefore stopped    Interval history-patient and  her daughter state that overall patient is doing well.  She has not had any recent falls.  Appetite and energy levels are somewhat improved after stopping chemotherapy.  Denies any abdominal pain or bloating.  ECOG PS- 2 Pain scale- 0   Review of systems- Review of Systems  Constitutional:  Positive for malaise/fatigue. Negative for chills, fever and weight loss.  HENT:  Negative for congestion, ear discharge and nosebleeds.   Eyes:  Negative for blurred vision.  Respiratory:  Negative for cough, hemoptysis, sputum production, shortness of breath and wheezing.   Cardiovascular:  Negative for chest pain, palpitations, orthopnea and claudication.  Gastrointestinal:  Negative for abdominal pain, blood in stool, constipation, diarrhea, heartburn, melena, nausea and vomiting.  Genitourinary:  Negative for dysuria, flank pain, frequency, hematuria and urgency.  Musculoskeletal:  Negative for back pain, joint pain and myalgias.  Skin:  Negative for rash.  Neurological:  Negative for dizziness, tingling, focal weakness, seizures, weakness and headaches.  Endo/Heme/Allergies:  Does not bruise/bleed easily.  Psychiatric/Behavioral:  Negative for depression and suicidal ideas. The patient does not have insomnia.      No Known Allergies   Past Medical History:  Diagnosis Date   Acute metabolic encephalopathy 62/26/3335   Acute tubular injury of transplanted kidney ([place]) 06/14/2016   AKI (acute kidney injury) ([place]) 06/14/2016   Asthma    Breast cancer ([place]) 2015   left   Bronchitis    CAP (community acquired pneumonia) 04/29/2012   Chronic sinusitis    Closed fracture of medial malleolus 05/11/2018   Confusion 12/06/2016   Diabetes mellitus without complication (HCC)    Family history of breast cancer  GERD (gastroesophageal reflux disease)    History of ankle fracture 05/14/2018   Last Assessment & Plan:  With a recent fall. Right medial malleous. Has ortho follow up soon, splinted now and  tylenol helps her pain   History of recurrent UTIs    Hypertension    Hypokalemia 04/26/2012   Hyponatremia    Hypothyroidism    Imbalance 09/02/2019   Insomnia    Migraine    Neuropathic pain    Ovarian cancer ([place]) 2022   Ovarian mass, left 07/14/2020   Personal history of breast cancer    Personal history of chemotherapy current   bilateral ovarian ca   Pneumonia    Rheumatoid arthritis ([place])    Sepsis ([place]) 04/26/2012   Sepsis secondary to UTI ([place]) 04/26/2012   Sleep apnea    Stroke [facility])    seen in CT scan   Thyroid disease    Transient hypotension 06/14/2016     Past Surgical History:  Procedure Laterality Date   ABDOMINAL HYSTERECTOMY     BACK SURGERY     BREAST BIOPSY Right ?`   benign   BREAST LUMPECTOMY Left 2015   breast ca   CHOLECYSTECTOMY     JOINT REPLACEMENT     LAPAROTOMY N/A 07/14/2020   Procedure: LAPAROTOMY;  Surgeon: Gae Dry, MD;  Location: [facility] ORS;  Service: Gynecology;  Laterality: N/A;   OOPHORECTOMY     OVARIAN CYST REMOVAL     PORTA CATH INSERTION N/A 07/27/2020   Procedure: PORTA CATH INSERTION;  Surgeon: Algernon Huxley, MD;  Location: [place] [facility];  Service: Cardiovascular;  Laterality: N/A;   REPLACEMENT TOTAL KNEE Right    SALPINGOOPHORECTOMY Bilateral 07/14/2020   Procedure: OPEN SALPINGO OOPHORECTOMY;  Surgeon: Gae Dry, MD;  Location: [facility] ORS;  Service: Gynecology;  Laterality: Bilateral;   TOTAL SHOULDER REPLACEMENT Left     Social History   Socioeconomic History   Marital status: Widowed    Spouse name: Not on file   Number of children: 2   Years of education: Not on file   Highest education level: Not on file  Occupational History   Occupation: retired   Tobacco Use   Smoking status: Never   Smokeless tobacco: Never  Vaping Use   Vaping Use: Never used  Substance and Sexual Activity   Alcohol use: No   Drug use: No   Sexual activity: Not Currently  Other Topics Concern   Not on file  Social  History Narrative   Pt lives in 1 story home with her daughter, [name] and [name]'s husband   Has 2 adult daughters   Highest level of education: some college   Worked mainly as Web designer.   Social Determinants of Health   Financial Resource Strain: Not on file  Food Insecurity: Not on file  Transportation Needs: Not on file  Physical Activity: Not on file  Stress: Not on file  Social Connections: Not on file  Intimate Partner Violence: Not on file    Family History  Problem Relation Age of Onset   Diabetes Daughter    Hypertension Daughter    Fibromyalgia Daughter    GER disease Daughter    Fibromyalgia Daughter    Crohn's disease Daughter    Asthma Daughter    Hypertension Mother    Breast cancer Other    Breast cancer Niece    Uterine cancer Niece      Current Outpatient Medications:    aspirin EC  325 MG tablet, Take 325 mg by mouth daily., Disp: , Rfl:    BOTOX 200 units SOLR, Inject 1 vial into the muscle every 3 (three) months., Disp: , Rfl:    Botulinum Toxin Type A (BOTOX) 200 units SOLR, Inject 155 units IM into multiple sites of face head and neck every 90 days, Disp: 1 each, Rfl: 4   Cranberry 1000 MG CAPS, Take 1 capsule by mouth 2 (two) times daily., Disp: , Rfl:    donepezil (ARICEPT) 10 MG tablet, Take 1 tablet (10 mg total) by mouth at bedtime., Disp: 90 tablet, Rfl: 3   dorzolamide-timolol (COSOPT) 22.3-6.8 MG/ML ophthalmic solution, Place 1 drop into both eyes 2 (two) times daily., Disp: , Rfl:    DULoxetine (CYMBALTA) 30 MG capsule, Take 1 capsule (30 mg total) by mouth daily. For depression and pain., Disp: 90 capsule, Rfl: 3   fluticasone (FLONASE) 50 MCG/ACT nasal spray, Place 2 sprays into both nostrils daily as needed for allergies or rhinitis., Disp: 48 g, Rfl: 0   gabapentin (NEURONTIN) 800 MG tablet, TAKE 1 TABLET BY MOUTH 3 TIMES A DAY FOR NEUROPATHY, Disp: 270 tablet, Rfl: 1   latanoprost (XALATAN) 0.005 % ophthalmic solution, Place 1  drop into both eyes at bedtime., Disp: , Rfl:    levothyroxine (SYNTHROID) 112 MCG tablet, TAKE 1 TABLET EVERY MORNING ON AN EMPTY STOMACH WITH WATER ONLY.  NO FOOD OR OTHER MEDICATIONS FOR 30 MINUTES. (Patient taking differently: TAKE 1 TABLET EVERY MORNING ON AN EMPTY STOMACH WITH WATER ONLY.  NO FOOD OR OTHER MEDICATIONS FOR 30 MINUTES.), Disp: 90 tablet, Rfl: 1   meclizine (ANTIVERT) 25 MG tablet, TAKE 1 TABLET (25 MG TOTAL) BY MOUTH 2 (TWO) TIMES DAILY AS NEEDED FOR DIZZINESS., Disp: 180 tablet, Rfl: 0   meloxicam (MOBIC) 15 MG tablet, TAKE 1 TABLET DAILY AS NEEDED FOR PAIN., Disp: 90 tablet, Rfl: 0   Netarsudil Dimesylate (RHOPRESSA) 0.02 % SOLN, Place 1 drop into both eyes daily at 6 (six) AM., Disp: , Rfl:    omeprazole (PRILOSEC) 20 MG capsule, TAKE 1 CAPSULE TWICE DAILY FOR HEARTBURN., Disp: 180 capsule, Rfl: 1   pilocarpine (PILOCAR) 2 % ophthalmic solution, Place 1 drop into both eyes 4 (four) times daily. , Disp: , Rfl:    propranolol (INDERAL) 80 MG tablet, Take 1 tablet (80 mg total) by mouth daily. For headache prevention., Disp: 90 tablet, Rfl: 1   rosuvastatin (CRESTOR) 10 MG tablet, TAKE 1 TABLET DAILY FOR CHOLESTEROL., Disp: 90 tablet, Rfl: 1   traZODone (DESYREL) 100 MG tablet, Take 2 tablets (200 mg total) by mouth at bedtime as needed for sleep., Disp: 180 tablet, Rfl: 1   Accu-Chek FastClix Lancets MISC, USE AS INSTRUCTED TO TEST BLOOD SUGAR DAILY (Patient not taking: Reported on 03/31/2021), Disp: 102 each, Rfl: 1   ACCU-CHEK GUIDE test strip, USE AS INSTRUCTED TO TEST BLOOD SUGAR DAILY (Patient not taking: Reported on 03/31/2021), Disp: 100 strip, Rfl: 1   blood glucose meter kit and supplies, Dispense based on patient and insurance preference. Use up to 2 times daily as directed. (FOR ICD-10 E10.9, E11.9). (Patient not taking: Reported on 03/31/2021), Disp: 1 each, Rfl: 0  Physical exam:  Vitals:   03/31/21 1448  BP: 138/72  Pulse: (!) 55  Resp: 16  Temp: 98.4 F (36.9  C)  TempSrc: Oral  Weight: 160 lb 14.4 oz (73 kg)  Height: _0  (1.626 m)   Physical Exam Cardiovascular:     Rate  and Rhythm: Normal rate and regular rhythm.     Heart sounds: Normal heart sounds.  Pulmonary:     Effort: Pulmonary effort is normal.     Breath sounds: Normal breath sounds.  Abdominal:     General: Bowel sounds are normal. There is no distension.     Palpations: Abdomen is soft.  Skin:    General: Skin is warm and dry.  Neurological:     Mental Status: She is alert and oriented to person, place, and time.     CMP Latest Ref Rng & Units 03/31/2021  Glucose 70 - 99 mg/dL 143(H)  BUN 8 - 23 mg/dL 13  Creatinine 0.44 - 1.00 mg/dL 0.95  Sodium 135 - 145 mmol/L 140  Potassium 3.5 - 5.1 mmol/L 3.8  Chloride 98 - 111 mmol/L 98  CO2 22 - 32 mmol/L 30  Calcium 8.9 - 10.3 mg/dL 9.4  Total Protein 6.5 - 8.1 g/dL 7.2  Total Bilirubin 0.3 - 1.2 mg/dL 0.4  Alkaline Phos 38 - 126 U/L 65  AST 15 - 41 U/L 22  ALT 0 - 44 U/L 13   CBC Latest Ref Rng & Units 03/31/2021  WBC 4.0 - 10.5 K/uL 8.8  Hemoglobin 12.0 - 15.0 g/dL 12.5  Hematocrit 36.0 - 46.0 % 38.4  Platelets 150 - 400 K/uL 213   Assessment and plan- Patient is a 86 y.o. female with high-grade serous carcinoma of the ovary FIGO stage I cpT1 cpNX cMX s/p bilateral salpingo-oophorectomy.  She is s/p 4 cycles of adjuvant carbo Doxil chemotherapy.  Chemotherapy was stopped following that and she is here for routine surveillance  Clinically patient is doing well with no concerning signs and symptoms of recurrence based on today's exam.  Pelvic exam was not done by me and she has not rescheduled her appointment with GYN oncology which will be done today.  She will likely see GYN oncology next month and I will see her back in 4 months with CBC with differential CMP and CA125   Visit Diagnosis 1. Encounter for follow-up surveillance of ovarian cancer      Dr. Randa Evens, MD, MPH [facility] at [facility] [phone] 04/04/2021 10:45 AM

## 2021-04-04 NOTE — Progress Notes (Signed)
Hematology/Oncology Consult note [facility]  Telephone:([phone] [phone]  Patient Care Team: Pleas Koch, NP as PCP - General (Internal Medicine) Cameron Sprang, MD as Consulting Physician (Neurology) Clent Jacks, RN as Oncology Nurse Navigator Charlton Haws, [facility] as Pharmacist (Pharmacist) Cameron Sprang, MD as Consulting Physician (Neurology)   Name of the patient: Shannon Higgins  [id number]  02/07/1935   Date of visit: 04/04/21  Diagnosis- high-grade serous carcinoma of the ovary FIGO stage IC pT1 cpNX cMX s/p bilateral salpingo-oophorectomy    Chief complaint/ Reason for visit-routine follow-up of ovarian cancer  Heme/Onc history: patient is a 86 year old female with a past medical history significant for type 2 diabetes, UTIs, hypertension GERD among other medical problems.  She will is diagnosed with UTI and as a part of her Work-up had CT abdomen and pelvis with contrast.  That showed a 13.5 11.8 x 15.2 cm simple cystic appearing area within the pelvis.  Ultrasound of the pelvis showed 17 x 10.7 x 11.9 cm complex cystic midline pelvic mass.  Findings indeterminate and could reflect a cystic ovarian neoplasm.  She did have CA-125 checked which was normal at 22.9 but HD4 was elevated at 99.  Patient underwent bilateral salpingo-oophorectomy.  She has had prior hysterectomy.  Pathology showed high-grade serous carcinoma involving both ovaries but no involvement of the fallopian tube.  Omentum was negative for malignancy.  Lymph nodes not sampled.  Tumor size 4 cm high-grade.  FIGO stage IC peritoneal/ascitic fluid involvement was not identified.  Patient has baseline neuropathy. Therefore carbo/doxil chosen for adjuvant chemotherapy upto 6 cycles if tolerated.   Patient completed 4 cycles of Carbo doxil chemotherapy in July 2022 but could not tolerate further doses and was therefore stopped    Interval history-patient and  her daughter state that overall patient is doing well.  She has not had any recent falls.  Appetite and energy levels are somewhat improved after stopping chemotherapy.  Denies any abdominal pain or bloating.  ECOG PS- 2 Pain scale- 0   Review of systems- Review of Systems  Constitutional:  Positive for malaise/fatigue. Negative for chills, fever and weight loss.  HENT:  Negative for congestion, ear discharge and nosebleeds.   Eyes:  Negative for blurred vision.  Respiratory:  Negative for cough, hemoptysis, sputum production, shortness of breath and wheezing.   Cardiovascular:  Negative for chest pain, palpitations, orthopnea and claudication.  Gastrointestinal:  Negative for abdominal pain, blood in stool, constipation, diarrhea, heartburn, melena, nausea and vomiting.  Genitourinary:  Negative for dysuria, flank pain, frequency, hematuria and urgency.  Musculoskeletal:  Negative for back pain, joint pain and myalgias.  Skin:  Negative for rash.  Neurological:  Negative for dizziness, tingling, focal weakness, seizures, weakness and headaches.  Endo/Heme/Allergies:  Does not bruise/bleed easily.  Psychiatric/Behavioral:  Negative for depression and suicidal ideas. The patient does not have insomnia.      No Known Allergies   Past Medical History:  Diagnosis Date   Acute metabolic encephalopathy 62/26/3335   Acute tubular injury of transplanted kidney ([place]) 06/14/2016   AKI (acute kidney injury) ([place]) 06/14/2016   Asthma    Breast cancer ([place]) 2015   left   Bronchitis    CAP (community acquired pneumonia) 04/29/2012   Chronic sinusitis    Closed fracture of medial malleolus 05/11/2018   Confusion 12/06/2016   Diabetes mellitus without complication (HCC)    Family history of breast cancer  GERD (gastroesophageal reflux disease)    History of ankle fracture 05/14/2018   Last Assessment & Plan:  With a recent fall. Right medial malleous. Has ortho follow up soon, splinted now and  tylenol helps her pain   History of recurrent UTIs    Hypertension    Hypokalemia 04/26/2012   Hyponatremia    Hypothyroidism    Imbalance 09/02/2019   Insomnia    Migraine    Neuropathic pain    Ovarian cancer ([place]) 2022   Ovarian mass, left 07/14/2020   Personal history of breast cancer    Personal history of chemotherapy current   bilateral ovarian ca   Pneumonia    Rheumatoid arthritis ([place])    Sepsis ([place]) 04/26/2012   Sepsis secondary to UTI ([place]) 04/26/2012   Sleep apnea    Stroke [facility])    seen in CT scan   Thyroid disease    Transient hypotension 06/14/2016     Past Surgical History:  Procedure Laterality Date   ABDOMINAL HYSTERECTOMY     BACK SURGERY     BREAST BIOPSY Right ?`   benign   BREAST LUMPECTOMY Left 2015   breast ca   CHOLECYSTECTOMY     JOINT REPLACEMENT     LAPAROTOMY N/A 07/14/2020   Procedure: LAPAROTOMY;  Surgeon: Gae Dry, MD;  Location: [facility] ORS;  Service: Gynecology;  Laterality: N/A;   OOPHORECTOMY     OVARIAN CYST REMOVAL     PORTA CATH INSERTION N/A 07/27/2020   Procedure: PORTA CATH INSERTION;  Surgeon: Algernon Huxley, MD;  Location: [place] [facility];  Service: Cardiovascular;  Laterality: N/A;   REPLACEMENT TOTAL KNEE Right    SALPINGOOPHORECTOMY Bilateral 07/14/2020   Procedure: OPEN SALPINGO OOPHORECTOMY;  Surgeon: Gae Dry, MD;  Location: [facility] OR[facility];  Service: Gynecology;  Laterality: Bilateral;   TOTAL SHOULDER REPLACEMENT Left     Social History   Socioeconomic History   Marital status: Widowed    Spouse name: Not on file   Number of children: 2   Years of education: Not on file   Highest education level: Not on file  Occupational History   Occupation: retired   Tobacco Use   Smoking status: Never   Smokeless tobacco: Never  Vaping Use   Vaping Use: Never used  Substance and Sexual Activity   Alcohol use: No   Drug use: No   Sexual activity: Not Currently  Other Topics Concern   Not on file  Social  History Narrative   Pt lives in 1 story home with her daughter, [name] and [name]'s husband   Has 2 adult daughters   Highest level of education: some college   Worked mainly as Web designer.   Social Determinants of Health   Financial Resource Strain: Not on file  Food Insecurity: Not on file  Transportation Needs: Not on file  Physical Activity: Not on file  Stress: Not on file  Social Connections: Not on file  Intimate Partner Violence: Not on file    Family History  Problem Relation Age of Onset   Diabetes Daughter    Hypertension Daughter    Fibromyalgia Daughter    GER disease Daughter    Fibromyalgia Daughter    Crohn's disease Daughter    Asthma Daughter    Hypertension Mother    Breast cancer Other    Breast cancer Niece    Uterine cancer Niece      Current Outpatient Medications:    aspirin EC  325 MG tablet, Take 325 mg by mouth daily., Disp: , Rfl:    BOTOX 200 units SOLR, Inject 1 vial into the muscle every 3 (three) months., Disp: , Rfl:    Botulinum Toxin Type A (BOTOX) 200 units SOLR, Inject 155 units IM into multiple sites of face head and neck every 90 days, Disp: 1 each, Rfl: 4   Cranberry 1000 MG CAPS, Take 1 capsule by mouth 2 (two) times daily., Disp: , Rfl:    donepezil (ARICEPT) 10 MG tablet, Take 1 tablet (10 mg total) by mouth at bedtime., Disp: 90 tablet, Rfl: 3   dorzolamide-timolol (COSOPT) 22.3-6.8 MG/ML ophthalmic solution, Place 1 drop into both eyes 2 (two) times daily., Disp: , Rfl:    DULoxetine (CYMBALTA) 30 MG capsule, Take 1 capsule (30 mg total) by mouth daily. For depression and pain., Disp: 90 capsule, Rfl: 3   fluticasone (FLONASE) 50 MCG/ACT nasal spray, Place 2 sprays into both nostrils daily as needed for allergies or rhinitis., Disp: 48 g, Rfl: 0   gabapentin (NEURONTIN) 800 MG tablet, TAKE 1 TABLET BY MOUTH 3 TIMES A DAY FOR NEUROPATHY, Disp: 270 tablet, Rfl: 1   latanoprost (XALATAN) 0.005 % ophthalmic solution, Place 1  drop into both eyes at bedtime., Disp: , Rfl:    levothyroxine (SYNTHROID) 112 MCG tablet, TAKE 1 TABLET EVERY MORNING ON AN EMPTY STOMACH WITH WATER ONLY.  NO FOOD OR OTHER MEDICATIONS FOR 30 MINUTES. (Patient taking differently: TAKE 1 TABLET EVERY MORNING ON AN EMPTY STOMACH WITH WATER ONLY.  NO FOOD OR OTHER MEDICATIONS FOR 30 MINUTES.), Disp: 90 tablet, Rfl: 1   meclizine (ANTIVERT) 25 MG tablet, TAKE 1 TABLET (25 MG TOTAL) BY MOUTH 2 (TWO) TIMES DAILY AS NEEDED FOR DIZZINESS., Disp: 180 tablet, Rfl: 0   meloxicam (MOBIC) 15 MG tablet, TAKE 1 TABLET DAILY AS NEEDED FOR PAIN., Disp: 90 tablet, Rfl: 0   Netarsudil Dimesylate (RHOPRESSA) 0.02 % SOLN, Place 1 drop into both eyes daily at 6 (six) AM., Disp: , Rfl:    omeprazole (PRILOSEC) 20 MG capsule, TAKE 1 CAPSULE TWICE DAILY FOR HEARTBURN., Disp: 180 capsule, Rfl: 1   pilocarpine (PILOCAR) 2 % ophthalmic solution, Place 1 drop into both eyes 4 (four) times daily. , Disp: , Rfl:    propranolol (INDERAL) 80 MG tablet, Take 1 tablet (80 mg total) by mouth daily. For headache prevention., Disp: 90 tablet, Rfl: 1   rosuvastatin (CRESTOR) 10 MG tablet, TAKE 1 TABLET DAILY FOR CHOLESTEROL., Disp: 90 tablet, Rfl: 1   traZODone (DESYREL) 100 MG tablet, Take 2 tablets (200 mg total) by mouth at bedtime as needed for sleep., Disp: 180 tablet, Rfl: 1   Accu-Chek FastClix Lancets MISC, USE AS INSTRUCTED TO TEST BLOOD SUGAR DAILY (Patient not taking: Reported on 03/31/2021), Disp: 102 each, Rfl: 1   ACCU-CHEK GUIDE test strip, USE AS INSTRUCTED TO TEST BLOOD SUGAR DAILY (Patient not taking: Reported on 03/31/2021), Disp: 100 strip, Rfl: 1   blood glucose meter kit and supplies, Dispense based on patient and insurance preference. Use up to 2 times daily as directed. (FOR ICD-10 E10.9, E11.9). (Patient not taking: Reported on 03/31/2021), Disp: 1 each, Rfl: 0  Physical exam:  Vitals:   03/31/21 1448  BP: 138/72  Pulse: (!) 55  Resp: 16  Temp: 98.4 F (36.9  C)  TempSrc: Oral  Weight: 160 lb 14.4 oz (73 kg)  Height: _0  (1.626 m)   Physical Exam Cardiovascular:     Rate  and Rhythm: Normal rate and regular rhythm.     Heart sounds: Normal heart sounds.  Pulmonary:     Effort: Pulmonary effort is normal.     Breath sounds: Normal breath sounds.  Abdominal:     General: Bowel sounds are normal. There is no distension.     Palpations: Abdomen is soft.  Skin:    General: Skin is warm and dry.  Neurological:     Mental Status: She is alert and oriented to person, place, and time.     CMP Latest Ref Rng & Units 03/31/2021  Glucose 70 - 99 mg/dL 143(H)  BUN 8 - 23 mg/dL 13  Creatinine 0.44 - 1.00 mg/dL 0.95  Sodium 135 - 145 mmol/L 140  Potassium 3.5 - 5.1 mmol/L 3.8  Chloride 98 - 111 mmol/L 98  CO2 22 - 32 mmol/L 30  Calcium 8.9 - 10.3 mg/dL 9.4  Total Protein 6.5 - 8.1 g/dL 7.2  Total Bilirubin 0.3 - 1.2 mg/dL 0.4  Alkaline Phos 38 - 126 U/L 65  AST 15 - 41 U/L 22  ALT 0 - 44 U/L 13   CBC Latest Ref Rng & Units 03/31/2021  WBC 4.0 - 10.5 K/uL 8.8  Hemoglobin 12.0 - 15.0 g/dL 12.5  Hematocrit 36.0 - 46.0 % 38.4  Platelets 150 - 400 K/uL 213   Assessment and plan- Patient is a 86 y.o. female with high-grade serous carcinoma of the ovary FIGO stage I cpT1 cpNX cMX s/p bilateral salpingo-oophorectomy.  She is s/p 4 cycles of adjuvant carbo Doxil chemotherapy.  Chemotherapy was stopped following that and she is here for routine surveillance  Clinically patient is doing well with no concerning signs and symptoms of recurrence based on today's exam.  Pelvic exam was not done by me and she has not rescheduled her appointment with GYN oncology which will be done today.  She will likely see GYN oncology next month and I will see her back in 4 months with CBC with differential CMP and CA125   Visit Diagnosis 1. Encounter for follow-up surveillance of ovarian cancer      Dr. Randa Evens, MD, MPH [facility] [phone] 04/04/2021 10:45 AM

## 2021-04-05 ENCOUNTER — Telehealth: Payer: Self-pay

## 2021-04-05 ENCOUNTER — Ambulatory Visit
Admission: RE | Admit: 2021-04-05 | Discharge: 2021-04-05 | Disposition: A | Discharge status: Home / Self Care | Payer: Medicare HMO | Attending: Vascular Surgery | Admitting: Vascular Surgery

## 2021-04-05 ENCOUNTER — Telehealth (INDEPENDENT_AMBULATORY_CARE_PROVIDER_SITE_OTHER): Payer: Self-pay

## 2021-04-05 ENCOUNTER — Other Ambulatory Visit: Payer: Self-pay

## 2021-04-05 ENCOUNTER — Encounter: Payer: Self-pay | Admitting: Vascular Surgery

## 2021-04-05 ENCOUNTER — Encounter
Admission: RE | Disposition: A | Discharge status: Home / Self Care | Payer: Self-pay | Source: Home / Self Care | Attending: Vascular Surgery

## 2021-04-05 DIAGNOSIS — N185 Chronic kidney disease, stage 5: Secondary | ICD-10-CM | POA: Diagnosis not present

## 2021-04-05 DIAGNOSIS — K219 Gastro-esophageal reflux disease without esophagitis: Secondary | ICD-10-CM | POA: Insufficient documentation

## 2021-04-05 DIAGNOSIS — C563 Malignant neoplasm of bilateral ovaries: Secondary | ICD-10-CM | POA: Insufficient documentation

## 2021-04-05 DIAGNOSIS — E1140 Type 2 diabetes mellitus with diabetic neuropathy, unspecified: Secondary | ICD-10-CM | POA: Diagnosis not present

## 2021-04-05 DIAGNOSIS — Z452 Encounter for adjustment and management of vascular access device: Secondary | ICD-10-CM | POA: Diagnosis not present

## 2021-04-05 DIAGNOSIS — I10 Essential (primary) hypertension: Secondary | ICD-10-CM | POA: Diagnosis not present

## 2021-04-05 DIAGNOSIS — C569 Malignant neoplasm of unspecified ovary: Secondary | ICD-10-CM

## 2021-04-05 HISTORY — PX: PORTA CATH REMOVAL: CATH118286

## 2021-04-05 SURGERY — PORTA CATH REMOVAL
Anesthesia: Moderate Sedation

## 2021-04-05 MED ORDER — CEFAZOLIN SODIUM-DEXTROSE 2-4 GM/100ML-% IV SOLN
2.0000 g | Freq: Once | INTRAVENOUS | Status: AC
  Administered 2021-04-05: 2 g via INTRAVENOUS

## 2021-04-05 MED ORDER — ONDANSETRON HCL 4 MG/2ML IJ SOLN: 4.0000 mg | Freq: Four times a day (QID) | INTRAMUSCULAR | Status: DC | PRN

## 2021-04-05 MED ORDER — HYDROMORPHONE HCL 1 MG/ML IJ SOLN: 1.0000 mg | Freq: Once | INTRAMUSCULAR | Status: DC | PRN

## 2021-04-05 MED ORDER — DIPHENHYDRAMINE HCL 50 MG/ML IJ SOLN: 50.0000 mg | Freq: Once | INTRAMUSCULAR | Status: DC | PRN

## 2021-04-05 MED ORDER — CHLORHEXIDINE GLUCONATE CLOTH 2 % EX PADS
6.0000 | MEDICATED_PAD | Freq: Every day | CUTANEOUS | Status: DC
  Administered 2021-04-05: 6 via TOPICAL

## 2021-04-05 MED ORDER — METHYLPREDNISOLONE SODIUM SUCC 125 MG IJ SOLR: 125.0000 mg | Freq: Once | INTRAMUSCULAR | Status: DC | PRN

## 2021-04-05 MED ORDER — FAMOTIDINE 20 MG PO TABS: 40.0000 mg | ORAL_TABLET | Freq: Once | ORAL | Status: DC | PRN

## 2021-04-05 MED ORDER — FENTANYL CITRATE PF 50 MCG/ML IJ SOSY
PREFILLED_SYRINGE | INTRAMUSCULAR | Status: AC
  Filled 2021-04-05: qty 1

## 2021-04-05 MED ORDER — SODIUM CHLORIDE 0.9 % IV SOLN: INTRAVENOUS | Status: DC

## 2021-04-05 MED ORDER — FENTANYL CITRATE (PF) 100 MCG/2ML IJ SOLN
INTRAMUSCULAR | Status: DC | PRN
  Administered 2021-04-05: 50 ug via INTRAVENOUS

## 2021-04-05 MED ORDER — MIDAZOLAM HCL 2 MG/ML PO SYRP: 8.0000 mg | ORAL_SOLUTION | Freq: Once | ORAL | Status: DC | PRN

## 2021-04-05 MED ORDER — MIDAZOLAM HCL 2 MG/2ML IJ SOLN
INTRAMUSCULAR | Status: AC
  Filled 2021-04-05: qty 2

## 2021-04-05 MED ORDER — MIDAZOLAM HCL 2 MG/2ML IJ SOLN
INTRAMUSCULAR | Status: DC | PRN
  Administered 2021-04-05: 2 mg via INTRAVENOUS

## 2021-04-05 SURGICAL SUPPLY — 7 items
DERMABOND ADVANCED (GAUZE/BANDAGES/DRESSINGS) ×1
DERMABOND ADVANCED .7 DNX12 (GAUZE/BANDAGES/DRESSINGS) ×1 IMPLANT
PACK ANGIOGRAPHY (CUSTOM PROCEDURE TRAY) ×2 IMPLANT
SPONGE XRAY 4X4 16PLY STRL (MISCELLANEOUS) ×2 IMPLANT
SUT MNCRL AB 4-0 PS2 18 (SUTURE) ×2 IMPLANT
SUT VIC AB 3-0 SH 27 (SUTURE) ×2
SUT VIC AB 3-0 SH 27X BRD (SUTURE) ×1 IMPLANT

## 2021-04-05 NOTE — Interval H&P Note (Signed)
History and Physical Interval Note:  04/05/2021 10:20 AM  [place] CARMACK  has presented today for surgery, with the diagnosis of [place]   Ovarian Cancer.  The various methods of treatment have been discussed with the patient and family. After consideration of risks, benefits and other options for treatment, the patient has consented to  Procedure(s): PORTA CATH REMOVAL (N/A) as a surgical intervention.  The patient's history has been reviewed, patient examined, no change in status, stable for surgery.  I have reviewed the patient's chart and labs.  Questions were answered to the patient's satisfaction.     Leotis Pain

## 2021-04-05 NOTE — Progress Notes (Signed)
Chronic Care Management Pharmacy Assistant   Name: Shannon Higgins  MRN: [id number] DOB: 02-Apr-1935  Reason for Encounter: CCM (Inital Questions)   Recent office visits:  01/16/2021 - Alma Friendly, NP - Patient presented for Annual Wellness Visit. No medication changes.  01/15/2021 - Alma Friendly, NP - Patient presented for chronic low back pain. Change: DULoxetine (CYMBALTA) 30 MG capsule vs. 60 mg. Labs: Lipid, TSH, A1c and LDL. Referral to home health.  12/17/2020 - Eliezer Lofts, MD - Video Visit - Daughter reports patient took at home test and Covid was positive. Start: molnupiravir EUA 200 mg CAPS for 5 days.  10/29/2020 - Alma Friendly, NP - Patient Message - Referral for Gastroenterology due to increased reflux.   Recent consult visits:  04/05/2021 - Leotis Pain, MD - Cardiovascular - Patient presented for [facility] Cath removal.  03/31/2021 - Randa Evens - Oncology - Patient presented for follow up for surveillance of ovarian cancer. Labs: CA 125, CBC and CMP. Stop due to completed course: fluorometholone (FML) 0.1 % ophthalmic suspension. Stop: ondansetron (ZOFRAN) 8 MG tablet. 03/23/2021 - Lucilla Lame, MD - Gastroenterology - Patient presented for esophageal dysphagia. No medication changes.  03/02/2021 - Theressa Millard, MD - Neurology - Patient presented for mild dementia without behavioral disturbance, psychotic disturbance, mood disturbance, or anxiety, unspecified dementia type .Follow up in 8 months. No medication changes.  02/01/2021 - Jola Schmidt - Ophthalmology - Patient presented for Primary open-angle glaucoma, bilateral, severe stage. No other information. 01/15/2021 - Metta Clines, DO - Neurology - Patient presented for chronic migraine without aura without status migrainosus, not intractable. Administered: botulinum toxin Type A (BOTOX) injection 200 Units.  01/12/2021 - Jola Schmidt - Ophthalmology - Patient presented for Primary open-angle glaucoma, bilateral, severe  stage. No other information.  12/29/2020 - [facility] - Patient presented for unresolved cough since had covid 10 days ago and return of fever yesterday. Start: azithromycin (ZITHROMAX) 250 MG tablet. Stop due to patient not taking: oxyCODONE-acetaminophen (PERCOCET/ROXICET) 5-325 MG tablet.  11/24/2020 - Randa Evens, MD - Patient presented for encounter for antineoplastic chemotherapy. From chart: Patient and her daughter are concerned that she has slowly declined since the start of chemotherapy.  She has more fatigue and tends to spend more time in bed.  There are times when she is confused as well.  In order to prevent any further decline in her quality of life they are leaning towards stopping all chemotherapy at this time which would be reasonable given her age. No chemotherapy given. PT will need periodic pelvic exams by GYN oncology.  10/27/2020 - [facility] - Patient presented for Echocardiogram. No other information.  11/16/2020 - Jola Schmidt - Ophthalmology - Patient presented for Primary open-angle glaucoma, bilateral, severe stage. No other information.  11/05/2020 - Randa Evens, MD - Oncology - Patient Message - Patient and daughter are in agreement to STOP chemotherapy due to patient being less sharp and a little confused (per daughter).  10/27/2020 - Randa Evens, MD - Oncology - Patient presented for treatment of cycle 4 of adjuvant carbo Doxil chemotherapy 10/16/2020 - Metta Clines, DO - Neurology - Patient presented for chronic migraine without aura without status migrainosus, not intractable. Administered: botulinum toxin Type A (BOTOX) injection 200 Units.   Hospital visits:  None in previous 6 months  Medications: Outpatient Encounter Medications as of 04/05/2021  Medication Sig Note   Accu-Chek FastClix Lancets MISC USE AS INSTRUCTED TO TEST BLOOD SUGAR DAILY (Patient not taking:  Reported on 03/31/2021)    ACCU-CHEK GUIDE test strip USE AS INSTRUCTED TO TEST  BLOOD SUGAR DAILY (Patient not taking: Reported on 03/31/2021)    aspirin EC 325 MG tablet Take 325 mg by mouth daily.    blood glucose meter kit and supplies Dispense based on patient and insurance preference. Use up to 2 times daily as directed. (FOR ICD-10 E10.9, E11.9). (Patient not taking: Reported on 03/31/2021)    BOTOX 200 units SOLR Inject 1 vial into the muscle every 3 (three) months.    Botulinum Toxin Type A (BOTOX) 200 units SOLR Inject 155 units IM into multiple sites of face head and neck every 90 days    Cranberry 1000 MG CAPS Take 1 capsule by mouth 2 (two) times daily. 08/14/2020: Family not sure about the dose   donepezil (ARICEPT) 10 MG tablet Take 1 tablet (10 mg total) by mouth at bedtime.    dorzolamide-timolol (COSOPT) 22.3-6.8 MG/ML ophthalmic solution Place 1 drop into both eyes 2 (two) times daily.    DULoxetine (CYMBALTA) 30 MG capsule Take 1 capsule (30 mg total) by mouth daily. For depression and pain.    fluticasone (FLONASE) 50 MCG/ACT nasal spray Place 2 sprays into both nostrils daily as needed for allergies or rhinitis.    gabapentin (NEURONTIN) 800 MG tablet TAKE 1 TABLET BY MOUTH 3 TIMES A DAY FOR NEUROPATHY    latanoprost (XALATAN) 0.005 % ophthalmic solution Place 1 drop into both eyes at bedtime.    levothyroxine (SYNTHROID) 112 MCG tablet TAKE 1 TABLET EVERY MORNING ON AN EMPTY STOMACH WITH WATER ONLY.  NO FOOD OR OTHER MEDICATIONS FOR 30 MINUTES. (Patient taking differently: TAKE 1 TABLET EVERY MORNING ON AN EMPTY STOMACH WITH WATER ONLY.  NO FOOD OR OTHER MEDICATIONS FOR 30 MINUTES.)    meclizine (ANTIVERT) 25 MG tablet TAKE 1 TABLET (25 MG TOTAL) BY MOUTH 2 (TWO) TIMES DAILY AS NEEDED FOR DIZZINESS.    meloxicam (MOBIC) 15 MG tablet TAKE 1 TABLET DAILY AS NEEDED FOR PAIN.    Netarsudil Dimesylate (RHOPRESSA) 0.02 % SOLN Place 1 drop into both eyes daily at 6 (six) AM.    omeprazole (PRILOSEC) 20 MG capsule TAKE 1 CAPSULE TWICE DAILY FOR HEARTBURN.     pilocarpine (PILOCAR) 2 % ophthalmic solution Place 1 drop into both eyes 4 (four) times daily.     propranolol (INDERAL) 80 MG tablet Take 1 tablet (80 mg total) by mouth daily. For headache prevention.    rosuvastatin (CRESTOR) 10 MG tablet TAKE 1 TABLET DAILY FOR CHOLESTEROL.    traZODone (DESYREL) 100 MG tablet Take 2 tablets (200 mg total) by mouth at bedtime as needed for sleep.    Facility-Administered Encounter Medications as of 04/05/2021  Medication   0.9 %  sodium chloride infusion   ceFAZolin (ANCEF) IVPB 2g/100 mL premix   Chlorhexidine Gluconate Cloth 2 % PADS 6 each   diphenhydrAMINE (BENADRYL) injection 50 mg   famotidine (PEPCID) tablet 40 mg   methylPREDNISolone sodium succinate (SOLU-MEDROL) 125 mg/2 mL injection 125 mg   midazolam (VERSED) 2 MG/ML syrup 8 mg    Lab Results  Component Value Date/Time   HGBA1C 6.3 01/15/2021 12:50 PM   HGBA1C 6.8 (H) 07/14/2020 02:38 PM   MICROALBUR 12.8 (H) 12/29/2017 03:08 PM    BP Readings from Last 3 Encounters:  03/31/21 138/72  03/23/21 (!) 149/80  03/02/21 119/73    Unsuccessful attempt to reach patient. Left patient message to have all medications, supplements and any  blood glucose and blood pressure readings available for review at appointment.    Star Rating Drugs:  Medication:  Last Fill: Day Supply Rosuvastatin 10 mg 12/04/2020 90 Verified with [place] Gaps: Annual wellness visit in last year? Yes 01/16/2021 Most Recent BP reading:  138/72 on 03/31/2021  If Diabetic: Most recent A1C reading: 6.3 on 01/15/2021 Last eye exam / retinopathy screening: 09/18/2018 Last diabetic foot exam: Up to date  Charlene Brooke, CPP notified  Marijean Niemann, [place] Pharmacy Assistant [phone]

## 2021-04-05 NOTE — Telephone Encounter (Signed)
Patient's daughter left a message for a return call. Spoke with the daughter and gave her the information regarding the patient's procedure on 04/05/21 with Dr. Lucky Cowboy. 10:00 am arrival time, NPO for 8 hours, all meds with small sips of water and one person to be with her. Patient's daughter stated the patient has not eaten and will take her medications with small sips of water and she will bring her to the MM.

## 2021-04-05 NOTE — Op Note (Signed)
[facility]       Operative Note  Date: 04/05/2021  Preoperative diagnosis:  1. Ovarian cancer, no longer using port  Postoperative diagnosis:  Same as above  Procedures: #1. Removal of right jugular port a cath   Surgeon: Leotis Pain, MD  Anesthesia: Local with moderate conscious sedation for 14 minutes using 2 mg of Versed and 50 mcg of Fentanyl  Fluoroscopy time: none  Contrast used: 0  Estimated blood loss: Minimal  Indication for the procedure:  The patient is a 86 y.o. female who has completed her treatment for ovarian cancer and no longer needs their Port-A-Cath. The patient desires to have this removed. Risks and benefits including need for potential replacement with recurrent disease were discussed and patient is agreeable to proceed.  Description of procedure: The patient was brought to the vascular and interventional radiology suite. Moderate conscious sedation was administered during a face to face encounter with the patient throughout the procedure with my supervision of the RN administering medicines and monitoring the patient's vital signs, pulse oximetry, telemetry and mental status throughout from the start of the procedure until the patient was taken to the recovery room.  The right neck chest and shoulder were sterilely prepped and draped, and a sterile surgical field was created. The area was then anesthetized with 1% lidocaine copiously. The previous incision was reopened and electrocautery used to dissected down to the port and the catheter. These were dissected free and the catheter was gently removed from the vein in its entirety. The port was dissected out from the fibrous connective tissue and the Prolene sutures were removed. The port was then removed in its entirety including the catheter. The wound was then closed with a 3-0 Vicryl and a 4-0 Monocryl and Dermabond was placed as a dressing. The patient  was then taken to the recovery room in stable condition having tolerated the procedure well.  Complications: none  Condition: stable   Leotis Pain, MD 04/05/2021 11:35 AM   This note was created with Dragon Medical transcription system. Any errors in dictation are purely unintentional.

## 2021-04-06 NOTE — Telephone Encounter (Signed)
F/u   Botox medication arrived to the office on 12.6.2022

## 2021-04-07 ENCOUNTER — Other Ambulatory Visit: Payer: Self-pay

## 2021-04-07 ENCOUNTER — Ambulatory Visit: Payer: Medicare HMO | Admitting: Pharmacist

## 2021-04-07 DIAGNOSIS — E039 Hypothyroidism, unspecified: Secondary | ICD-10-CM

## 2021-04-07 DIAGNOSIS — I10 Essential (primary) hypertension: Secondary | ICD-10-CM

## 2021-04-07 DIAGNOSIS — F0390 Unspecified dementia without behavioral disturbance: Secondary | ICD-10-CM

## 2021-04-07 DIAGNOSIS — R519 Headache, unspecified: Secondary | ICD-10-CM

## 2021-04-07 DIAGNOSIS — E1142 Type 2 diabetes mellitus with diabetic polyneuropathy: Secondary | ICD-10-CM

## 2021-04-07 DIAGNOSIS — G8929 Other chronic pain: Secondary | ICD-10-CM

## 2021-04-07 DIAGNOSIS — E785 Hyperlipidemia, unspecified: Secondary | ICD-10-CM

## 2021-04-07 DIAGNOSIS — K219 Gastro-esophageal reflux disease without esophagitis: Secondary | ICD-10-CM

## 2021-04-07 DIAGNOSIS — F325 Major depressive disorder, single episode, in full remission: Secondary | ICD-10-CM

## 2021-04-07 NOTE — Progress Notes (Signed)
Chronic Higgins Management Pharmacy Note  04/11/2021 Name:  Shannon Higgins MRN:  [id number] DOB:  [birth year]  Summary: -Pt did not reduce levothyroxine dose (1/2 tab once a week) as instructed per PCP in September (TSH was 0.23). Discussed consequences of hyperthyroidism, including headaches -Pt reports more frequent migraines over the past month (5-10), normally she has 0 since starting Botox. She has discussed with neurologist and no changes were advised.  Recommendations/Changes made from today's visit: -Advised to take 1/2 tab of levothyroxine once a week and whole tab rest of the week (previously recommended per PCP); recommend repeat TSH in 8-12 weeks  Plan: -[facility] will call patient in 2 months to ensure repeat TSH -Pharmacist follow up televisit scheduled for 6 months    Subjective: Shannon Higgins is an 86 y.o. year old female who is a primary patient of Shannon Koch, NP.  The CCM team was consulted for assistance with disease management and Higgins coordination needs.    Engaged with patient by telephone for initial visit in response to provider referral for pharmacy case management and/or Higgins coordination services.   Consent to Services:  The patient was given the following information about Chronic Higgins Management services today, agreed to services, and gave verbal consent: 1. CCM service includes personalized support from designated clinical staff supervised by the primary Higgins provider, including individualized plan of Higgins and coordination with other Higgins providers 2. 24/7 contact phone numbers for assistance for urgent and routine Higgins needs. 3. Service will only be billed when office clinical staff spend 20 minutes or more in a month to coordinate Higgins. 4. Only one practitioner may furnish and bill the service in a calendar month. 5.The patient may stop CCM services at any time (effective at the end of the month) by phone call to the office staff. 6. The  patient will be responsible for cost sharing (co-pay) of up to 20% of the service fee (after annual deductible is met). Patient agreed to services and consent obtained.  Patient Higgins Team: Shannon Koch, NP as PCP - General (Internal Medicine) Shannon Sprang, MD as Consulting Physician (Neurology) Shannon Jacks, RN as Oncology Nurse Navigator Shannon Higgins, [facility] as Pharmacist (Pharmacist) Shannon Sprang, MD as Consulting Physician (Neurology)  Spoke with patient and daugther for initial CCM visit.  Recent office visits: 01/15/2021 - Shannon Friendly, NP - Patient presented for chronic low back Higgins. Labs: Lipid, TSH, A1c and LDL. Referral to home health. TSH low, reduce levothyroxine by 1/2 tablet once a week.  12/17/2020 - Shannon Lofts, MD - Video Visit - Daughter reports patient took at home test and Covid was positive. Start: molnupiravir EUA 200 mg CAPS for 5 days.   10/29/2020 - Shannon Friendly, NP - Patient Message - Referral for Gastroenterology due to increased reflux.   Recent consult visits: 04/05/2021 - Shannon Pain, MD - Cardiovascular - Patient presented for [facility] Cath removal.  03/31/2021 - Shannon Higgins - Oncology - Patient presented for follow up for surveillance of ovarian cancer. Labs: CA 125, CBC and CMP. Stop due to completed course: fluorometholone (FML) 0.1 % ophthalmic suspension. Stop: ondansetron (ZOFRAN) 8 MG tablet. 03/23/2021 - Shannon Lame, MD - Gastroenterology - Patient presented for esophageal dysphagia. No medication changes.  03/02/2021 - Shannon Newer, MD - Neurology - Patient presented for mild dementia without behavioral disturbance, psychotic disturbance, mood disturbance, or anxiety, unspecified dementia type .Follow up in 8 months. No medication changes.  02/01/2021 -  Shannon Higgins - Ophthalmology - Patient presented for Primary open-angle glaucoma, bilateral, severe stage. No other information. 01/15/2021 - Shannon Clines, DO - Neurology - Patient  presented for chronic migraine without aura without status migrainosus, not intractable. Administered: botulinum toxin Type A (BOTOX) injection 200 Units.  01/12/2021 - Shannon Higgins - Ophthalmology - Patient presented for Primary open-angle glaucoma, bilateral, severe stage. No other information.  12/29/2020 - Shannon Higgins - Patient presented for unresolved cough since had covid 10 days ago and return of fever yesterday. Start: azithromycin (ZITHROMAX) 250 MG tablet. Stop due to patient not taking: oxyCODONE-acetaminophen (PERCOCET/ROXICET) 5-325 MG tablet.  11/24/2020 - Shannon Evens, MD - Patient presented for encounter for antineoplastic chemotherapy. From chart: Patient and her daughter are concerned that she has slowly declined since the start of chemotherapy.  She has more fatigue and tends to spend more time in bed.  There are times when she is confused as well.  In order to prevent any further decline in her quality of life they are leaning towards stopping all chemotherapy at this time which would be reasonable given her age. No chemotherapy given. PT will need periodic pelvic exams by GYN oncology.  10/27/2020 - Shannon Higgins - Patient presented for Echocardiogram. No other information.  11/16/2020 - Shannon Higgins - Ophthalmology - Patient presented for Primary open-angle glaucoma, bilateral, severe stage. No other information.  11/05/2020 - Shannon Evens, MD - Oncology - Patient Message - Patient and daughter are in agreement to STOP chemotherapy due to patient being less sharp and a little confused (per daughter).  10/27/2020 - Shannon Evens, MD - Oncology - Patient presented for treatment of cycle 4 of adjuvant carbo Doxil chemotherapy 10/16/2020 - Shannon Clines, DO - Neurology - Patient presented for chronic migraine without aura without status migrainosus, not intractable. Administered: botulinum toxin Type A (BOTOX) injection 200 Units.    Hospital visits: None in previous 6  months   Objective:  Lab Results  Component Value Date   CREATININE 0.95 03/31/2021   BUN 13 03/31/2021   GFR 57.78 (L) 05/05/2020   GFRNONAA 59 (L) 03/31/2021   GFRAA >60 01/20/2020   NA 140 03/31/2021   K 3.8 03/31/2021   CALCIUM 9.4 03/31/2021   CO2 30 03/31/2021   GLUCOSE 143 (H) 03/31/2021    Lab Results  Component Value Date/Time   HGBA1C 6.3 01/15/2021 12:50 PM   HGBA1C 6.8 (H) 07/14/2020 02:38 PM   GFR 57.78 (L) 05/05/2020 03:43 PM   GFR 61.47 09/02/2019 04:06 PM   MICROALBUR 12.8 (H) 12/29/2017 03:08 PM    Last diabetic Eye exam:  Lab Results  Component Value Date/Time   HMDIABEYEEXA No Retinopathy 09/18/2018 12:00 AM    Last diabetic Foot exam: No results found for: HMDIABFOOTEX   Lab Results  Component Value Date   CHOL 182 01/15/2021   HDL 60.60 01/15/2021   LDLCALC 65 03/04/2019   LDLDIRECT 99.0 01/15/2021   TRIG 225.0 (H) 01/15/2021   CHOLHDL 3 01/15/2021    Hepatic Function Latest Ref Rng & Units 03/31/2021 11/24/2020 10/27/2020  Total Protein 6.5 - 8.1 g/dL 7.2 6.9 6.6  Albumin 3.5 - 5.0 g/dL 4.0 4.0 3.8  AST 15 - 41 U/L 22 22 20   ALT 0 - 44 U/L 13 11 11   Alk Phosphatase 38 - 126 U/L 65 80 83  Total Bilirubin 0.3 - 1.2 mg/dL 0.4 0.7 0.5  Bilirubin, Direct 0.0 - 0.2 mg/dL - - -    Lab Results  Component Value Date/Time   TSH 0.23 (L) 01/15/2021 12:50 PM   TSH 0.86 05/05/2020 03:43 PM    CBC Latest Ref Rng & Units 03/31/2021 11/24/2020 10/27/2020  WBC 4.0 - 10.5 K/uL 8.8 9.8 7.6  Hemoglobin 12.0 - 15.0 g/dL 12.5 10.3(L) 11.0(L)  Hematocrit 36.0 - 46.0 % 38.4 33.1(L) 35.0(L)  Platelets 150 - 400 K/uL 213 296 292    Lab Results  Component Value Date/Time   VD25OH 35.31 06/21/2017 04:16 PM    Clinical ASCVD: No  The ASCVD Risk score (Arnett DK, et al., 2019) failed to calculate for the following reasons:   The 2019 ASCVD risk score is only valid for ages 25 to 58    Depression screen PHQ 2/9 01/16/2021 01/16/2021 05/05/2020  Decreased  Interest 0 0 0  Down, Depressed, Hopeless 0 0 0  PHQ - 2 Score 0 0 0  Altered sleeping 0 - 0  Tired, decreased energy 3 - 0  Change in appetite 0 - 0  Feeling bad or failure about yourself  0 - 0  Trouble concentrating 0 - 0  Moving slowly or fidgety/restless 0 - 0  Suicidal thoughts 0 - 0  PHQ-9 Score 3 - 0  Difficult doing work/chores Not difficult at all - Not difficult at all  Some recent data might be hidden     Social History   Tobacco Use  Smoking Status Never  Smokeless Tobacco Never   BP Readings from Last 3 Encounters:  04/05/21 (!) 152/76  03/31/21 138/72  03/23/21 (!) 149/80   Pulse Readings from Last 3 Encounters:  04/05/21 63  03/31/21 (!) 55  03/23/21 69   Wt Readings from Last 3 Encounters:  04/05/21 160 lb (72.6 kg)  03/31/21 160 lb 14.4 oz (73 kg)  03/23/21 162 lb (73.5 kg)   BMI Readings from Last 3 Encounters:  04/05/21 27.46 kg/m  03/31/21 27.62 kg/m  03/23/21 27.81 kg/m    Assessment/Interventions: Review of patient past medical history, allergies, medications, health status, including review of consultants reports, laboratory and other test data, was performed as part of comprehensive evaluation and provision of chronic Higgins management services.   SDOH:  (Social Determinants of Health) assessments and interventions performed: Yes  SDOH Screenings   Alcohol Screen: Not on file  Depression (PHQ2-9): Low Risk    PHQ-2 Score: 3  Financial Resource Strain: Not on file  Food Insecurity: Not on file  Housing: Not on file  Physical Activity: Not on file  Social Connections: Not on file  Stress: Not on file  Tobacco Use: Low Risk    Smoking Tobacco Use: Never   Smokeless Tobacco Use: Never   Passive Exposure: Not on file  Transportation Needs: Not on file    [place]  No Known Allergies  Medications Reviewed Today     Reviewed by Charlton Haws, [facility] (Pharmacist) on 04/07/21 at [place] List Status: <None>    Medication Order Taking? Sig Documenting Provider Last Dose Status Informant  Accu-Chek FastClix Lancets MISC [id number] Yes USE AS INSTRUCTED TO TEST BLOOD SUGAR DAILY Shannon Koch, NP Taking Active   ACCU-CHEK GUIDE test strip [id number] Yes USE AS INSTRUCTED TO TEST BLOOD SUGAR DAILY Shannon Koch, NP Taking Active   aspirin EC 325 MG tablet [id number] Yes Take 325 mg by mouth daily. [provider] Taking Active Family Member  blood glucose meter kit and supplies [id number] Yes Dispense based on patient and insurance preference. Use up  to 2 times daily as directed. (FOR ICD-10 E10.9, E11.9). Shannon Koch, NP Taking Active   Botulinum Toxin Type A (BOTOX) 200 units SOLR [id number] Yes Inject 155 units IM into multiple sites of face head and neck every 90 days Pieter Partridge, DO Taking Active   Cranberry 1000 MG CAPS [id number] Yes Take 1 capsule by mouth 2 (two) times daily. [provider] Taking Active            Med Note Charlton Haws   Wed Apr 07, 2021  3:00 PM)    donepezil (ARICEPT) 10 MG tablet [id number] Yes Take 1 tablet (10 mg total) by mouth at bedtime. Shannon Sprang, MD Taking Active   dorzolamide-timolol (COSOPT) 22.3-6.8 MG/ML ophthalmic solution [id number] Yes Place 1 drop into both eyes 2 (two) times daily. [provider] Taking Active Family Member  DULoxetine (CYMBALTA) 30 MG capsule [id number] Yes Take 1 capsule (30 mg total) by mouth daily. For depression and Higgins. Shannon Koch, NP Taking Active   fluticasone [facility]) 50 MCG/ACT nasal spray [id number] Yes Place 2 sprays into both nostrils daily as needed for allergies or rhinitis. Shannon Koch, NP Taking Active   gabapentin (NEURONTIN) 800 MG tablet [id number] Yes TAKE 1 TABLET BY MOUTH 3 TIMES A DAY FOR NEUROPATHY  Patient taking differently: 800 mg AM, 1600 mg bedtime   Shannon Koch, NP Taking Active   latanoprost (XALATAN) 0.005 % ophthalmic solution [id number]  Yes Place 1 drop into both eyes at bedtime. [provider] Taking Active Family Member  levothyroxine (SYNTHROID) 112 MCG tablet [id number] Yes TAKE 1 TABLET EVERY MORNING ON AN EMPTY STOMACH WITH WATER ONLY.  NO FOOD OR OTHER MEDICATIONS FOR 30 MINUTES.  Patient taking differently: TAKE 1 TABLET EVERY MORNING except 1/2 tab on Sundays. TAKE ON AN EMPTY STOMACH WITH WATER ONLY.  NO FOOD OR OTHER MEDICATIONS FOR 30 MINUTES.   Shannon Koch, NP Taking Active Family Member  meclizine (ANTIVERT) 25 MG tablet [id number] Yes TAKE 1 TABLET (25 MG TOTAL) BY MOUTH 2 (TWO) TIMES DAILY AS NEEDED FOR DIZZINESS. Shannon Koch, NP Taking Active Family Member  meloxicam [facility]) 15 MG tablet [id number] Yes TAKE 1 TABLET DAILY AS NEEDED FOR Higgins. Shannon Koch, NP Taking Active   Netarsudil Dimesylate (RHOPRESSA) 0.02 % SOLN [id number] Yes Place 1 drop into both eyes daily at 6 (six) AM. [provider] Taking Active Family Member  omeprazole (PRILOSEC) 20 MG capsule [id number] Yes TAKE 1 CAPSULE TWICE DAILY FOR HEARTBURN. Shannon Koch, NP Taking Active   pilocarpine [facility]) 2 % ophthalmic solution [id number] Yes Place 1 drop into both eyes 4 (four) times daily.  [provider] Taking Active Family Member  propranolol (INDERAL) 80 MG tablet [id number] Yes Take 1 tablet (80 mg total) by mouth daily. For headache prevention. Shannon Koch, NP Taking Active   rosuvastatin (CRESTOR) 10 MG tablet [id number] Yes TAKE 1 TABLET DAILY FOR CHOLESTEROL. Shannon Koch, NP Taking Active   traZODone (DESYREL) 100 MG tablet [id number] Yes Take 2 tablets (200 mg total) by mouth at bedtime as needed for sleep. Shannon Koch, NP Taking Active             Patient Active Problem List   Diagnosis Date Noted   COVID-19 virus infection 12/17/2020   Immunocompromised ([place]) 12/17/2020   Personal history of breast cancer    Family history of breast cancer    Ovarian cancer,  bilateral (  [place]) 07/21/2020   S/P bilateral oophorectomy 07/21/2020   Preventative health Higgins 03/04/2019   Insomnia 09/27/2018   History of TIA (transient ischemic attack) 09/13/2018   Recurrent UTI 06/20/2018   Frequent falls 05/14/2018   Gout 05/14/2018   Type 2 diabetes mellitus with peripheral neuropathy ([place]) 05/11/2018   Type 2 diabetes mellitus ([place]) 05/11/2018   Orthostatic hypotension 05/08/2018   History of breast cancer 10/17/2017   Major depressive disorder 06/21/2017   Chronic low back Higgins 01/13/2017   Hyperlipidemia 12/06/2016   Glaucoma 12/06/2016   Neuropathic Higgins 12/06/2016   Malignant melanoma of skin ([place]) 12/06/2016   Dementia arising in the senium and presenium ([place]) 12/06/2016   Chronic headache disorder 12/06/2016   Hypothyroidism 06/14/2016   Anemia 04/26/2012   Hypertensive disorder 04/26/2012   Gastroesophageal reflux disease 04/26/2012   Obstructive sleep apnea syndrome 04/26/2012    Immunization History  Administered Date(s) Administered   Fluad Quad(high Dose 65+) 05/05/2020, 01/15/2021   Influenza,inj,Quad PF,6+ Mos 01/29/2018, 03/04/2019   PFIZER(Purple Top)SARS-COV-2 Vaccination 05/21/2019, 06/10/2019, 07/03/2020   Pneumococcal Polysaccharide-23 06/20/2018   Tdap 12/09/2018   Zoster Recombinat (Shingrix) 03/06/2019    Conditions to be addressed/monitored:  Hypertension, Hyperlipidemia, Diabetes, GERD, Hypothyroidism, and Depression, Dementia, neuropathy  Higgins Plan : [place]  Updates made by Charlton Haws, [place] since 04/11/2021 12:00 AM     Problem: Hypertension, Hyperlipidemia, Diabetes, GERD, Hypothyroidism, and Depression, Dementia, neuropathy   Priority: High     Long-Range Goal: Disease mgmt   Start Date: 04/11/2021  Expected End Date: 04/11/2022  This Visit's Progress: On track  Priority: High  Note:   Current Barriers:  Unable to independently monitor therapeutic efficacy  Pharmacist Clinical  Goal(s):  Patient will achieve adherence to monitoring guidelines and medication adherence to achieve therapeutic efficacy through collaboration with PharmD and provider.   Interventions: 1:1 collaboration with Shannon Koch, NP regarding development and update of comprehensive plan of Higgins as evidenced by provider attestation and co-signature Inter-disciplinary Higgins team collaboration (see longitudinal plan of Higgins) Comprehensive medication review performed; medication list updated in electronic medical record  Hypertension (BP goal <140/90) -Not ideally controlled - most recent BP in chart was elevated 152/76, taken during admission for porta cath removal; pt reports normally BP is at goal though she does not check at home often -Current treatment: Propranolol 80 mg daily (headaches) -Denies hypotensive/hypertensive symptoms -Educated on BP goals and benefits of medications for prevention of heart attack, stroke and kidney damage; Daily salt intake goal < 2300 mg; Importance of home blood pressure monitoring; -Counseled to monitor BP at home 2-3x weekly -Recommended to continue current medication  Hyperlipidemia: (LDL goal < 100) -Controlled - LDL is at goal; pt endorses compliance with statin; she takes full dose aspirin due to hx of TIA; she denies bleeding issues -Current treatment: Rosuvastatin 10 mg daily AM Aspirin EC 325 mg daily -Educated on Cholesterol goals; Benefits of statin for ASCVD risk reduction; -Recommended to continue current medication  Diabetes (A1c goal <7%) -Diet-controlled  - A1c is at goal in pre-diabetic range -Medications previously tried: metformin  -Educated on A1c and blood sugar goals; -Counseled to check feet daily and get yearly eye exams -Counseled on diet and exercise extensively  Higgins (Goal: manage symptoms) -Controlled - per pt report -Current treatment  Duloxetine 30 mg daily Meloxicam 15 mg daily PRN Gabapentin 800 mg TID - 800 AM,  1600 mg PM -Recommended to continue current medication  Depression/Anxiety/Insomnia (Goal: manage symptoms) -  Controlled - pt reports she sleeps 5-6 hours per night usually and feels relatively rested -Current treatment: Duloxetine 30 mg daily Trazodone 100 mg - 2 tab HS prn -Educated on Benefits of medication for symptom control -Recommended to continue current medication  Dementia (Goal: slow progression) -Controlled - pt endorses compliance; -Current treatment  Donepezil 10 mg daily HS -Counseled on role of medication to slow progression of disease -Recommended to continue current medication  Headaches (Goal: reduce frequency) -Not ideally controlled - several migraines in the past month (5-10), which is increased; since starting Botox injections she typically has 0 migraines; she thought the most recent Botox injection did not work for some reason, she has discussed this with neurology and no changes were recommended -Current treatment  Propranolol 80 mg daily Botox injections q3 months -Recommended to continue current medication  Hypothyroidism (Goal: maintain TSH in goal range) -Not ideally controlled - pt was supposed to cut pill in 1/2 once a week due to elevated TSH at last PCP visit; she has not done this -Current treatment  Levothyroxine 112 mcg daily - not in pill box -Counseled on consequences of elevated thyroid hormone, which can include worsening headaches -Advised to cut 1 pill in 1/2 once a week (Sundays); recommend repeat TSH in 8-12 weeks  GERD (Goal: manage symptoms) -Controlled - pt reports sx gets worse off of medication -Current treatment  Omeprazole 20 mg BID -Recommended to continue current medication  Glaucoma (Goal: maintain IOP in goal range) -Controlled - pt follows with eye doctor regularly -Current treatment  Dorzolamide-timolol (Cosopt) eye drops BID Latanoprost 0.005% eye drops HS Netarsudil (Rhopressa) eye drops AM Pilocarpine 2% eye drops  QID -Recommended to continue current medication  Health Maintenance -Vaccine gaps: Covid booster, Prevnar, Shingrix -Advised Prevnar vaccine at pharmacy or next office visit -Hx breast cancer, melanoma, gout. Active ovarian cancer - chemo stopped 10/2020 due to intolerability. Under surveillance with oncology and GYN oncology. -Current therapy:  Fluticasone nasal spray Cranberry Meclizine 25 mg PRN -Patient is satisfied with current therapy and denies issues -Recommended to continue current medication  Patient Goals/Self-Higgins Activities Patient will:  - take medications as prescribed as evidenced by patient report and record review focus on medication adherence by pill box check blood pressure 2-3x weekly, document, and provide at future appointments -Reduce levothyroxine by taking 1/2 tablet on Sundays. Repeat thyroid labs in 8-12 weeks.      Medication Assistance: None required.  Patient affirms current coverage meets needs.  Compliance/Adherence/Medication fill history: Higgins Gaps: None  Star-Rating Drugs: Rosuvastatin - LF 12/04/20 x 90 ds; PDC 85% -pt reports she has at least 2 weeks remaining; advised to contact pharmacy for refill today  Patient's preferred pharmacy is:  [id number] [place] [address] [id number]: [phone]: [phone] [address]Phone: [phone]: [phone] Uses pill box? Yes Pt endorses 100% compliance  We discussed: Current pharmacy is preferred with insurance plan and patient is satisfied with pharmacy services Patient decided to: Continue current medication management strategy  Higgins Plan and Follow Up Patient Decision:  Patient agrees to Higgins Plan and Follow-up.  Plan: Telephone follow up appointment with Higgins management team member scheduled for:  6  months  LCharlene Brooke PharmD, BCACP Clinical Pharmacist [place]ugarloaf VillagePrimary [phone]

## 2021-04-10 ENCOUNTER — Other Ambulatory Visit: Payer: Self-pay | Admitting: Primary Care

## 2021-04-10 DIAGNOSIS — M792 Neuralgia and neuritis, unspecified: Secondary | ICD-10-CM

## 2021-04-10 DIAGNOSIS — N390 Urinary tract infection, site not specified: Secondary | ICD-10-CM

## 2021-04-11 NOTE — Telephone Encounter (Signed)
[name], please call patient's daughter:  I received a refill request for Trimethoprim for which she is/was taking once daily for UTI prevention. It looks like this is no longer on her medication list, is she taking this?

## 2021-04-11 NOTE — Patient Instructions (Signed)
Visit Information  Phone number for Pharmacist: [phone]  Thank you for meeting with me to discuss your medications! I look forward to working with you to achieve your health care goals. Below is a summary of what we talked about during the visit:   Goals Addressed             This Visit's Progress    Manage My Medicine       Timeframe:  Long-Range Goal Priority:  High Start Date:       04/08/21                      Expected End Date:    04/08/22                  Follow Up Date June 2023   - call for medicine refill 2 or 3 days before it runs out - call if I am sick and can't take my medicine - keep a list of all the medicines I take; vitamins and herbals too - use a pillbox to sort medicine -Reduce levothyroxine by taking 1/2 tablet on Sundays.   Why is this important?   These steps will help you keep on track with your medicines.   Notes:         Care Plan : [place]  Updates made by Charlton Haws, [place] since 04/11/2021 12:00 AM     Problem: Hypertension, Hyperlipidemia, Diabetes, GERD, Hypothyroidism, and Depression, Dementia, neuropathy   Priority: High     Long-Range Goal: Disease mgmt   Start Date: 04/11/2021  Expected End Date: 04/11/2022  This Visit's Progress: On track  Priority: High  Note:   Current Barriers:  Unable to independently monitor therapeutic efficacy  Pharmacist Clinical Goal(s):  Patient will achieve adherence to monitoring guidelines and medication adherence to achieve therapeutic efficacy through collaboration with PharmD and provider.   Interventions: 1:1 collaboration with Pleas Koch, NP regarding development and update of comprehensive plan of care as evidenced by provider attestation and co-signature Inter-disciplinary care team collaboration (see longitudinal plan of care) Comprehensive medication review performed; medication list updated in electronic medical record  Hypertension (BP goal  <140/90) -Not ideally controlled - most recent BP in chart was elevated 152/76, taken during admission for porta cath removal; pt reports normally BP is at goal though she does not check at home often -Current treatment: Propranolol 80 mg daily (headaches) -Denies hypotensive/hypertensive symptoms -Educated on BP goals and benefits of medications for prevention of heart attack, stroke and kidney damage; Daily salt intake goal < 2300 mg; Importance of home blood pressure monitoring; -Counseled to monitor BP at home 2-3x weekly -Recommended to continue current medication  Hyperlipidemia: (LDL goal < 100) -Controlled - LDL is at goal; pt endorses compliance with statin; she takes full dose aspirin due to hx of TIA; she denies bleeding issues -Current treatment: Rosuvastatin 10 mg daily AM Aspirin EC 325 mg daily -Educated on Cholesterol goals; Benefits of statin for ASCVD risk reduction; -Recommended to continue current medication  Diabetes (A1c goal <7%) -Diet-controlled  - A1c is at goal in pre-diabetic range -Medications previously tried: metformin  -Educated on A1c and blood sugar goals; -Counseled to check feet daily and get yearly eye exams -Counseled on diet and exercise extensively  Pain (Goal: manage symptoms) -Controlled - per pt report -Current treatment  Duloxetine 30 mg daily Meloxicam 15 mg daily PRN Gabapentin 800 mg TID - 800 AM, 1600 mg  PM -Recommended to continue current medication  Depression/Anxiety/Insomnia (Goal: manage symptoms) -Controlled - pt reports she sleeps 5-6 hours per night usually and feels relatively rested -Current treatment: Duloxetine 30 mg daily Trazodone 100 mg - 2 tab HS prn -Educated on Benefits of medication for symptom control -Recommended to continue current medication  Dementia (Goal: slow progression) -Controlled - pt endorses compliance; -Current treatment  Donepezil 10 mg daily HS -Counseled on role of medication to slow  progression of disease -Recommended to continue current medication  Headaches (Goal: reduce frequency) -Not ideally controlled - several migraines in the past month (5-10), which is increased; since starting Botox injections she typically has 0 migraines; she thought the most recent Botox injection did not work for some reason, she has discussed this with neurology and no changes were recommended -Current treatment  Propranolol 80 mg daily Botox injections q3 months -Recommended to continue current medication  Hypothyroidism (Goal: maintain TSH in goal range) -Not ideally controlled - pt was supposed to cut pill in 1/2 once a week due to elevated TSH at last PCP visit; she has not done this -Current treatment  Levothyroxine 112 mcg daily - not in pill box -Counseled on consequences of elevated thyroid hormone, which can include worsening headaches -Advised to cut 1 pill in 1/2 once a week (Sundays); recommend repeat TSH in 8-12 weeks  GERD (Goal: manage symptoms) -Controlled - pt reports sx gets worse off of medication -Current treatment  Omeprazole 20 mg BID -Recommended to continue current medication  Glaucoma (Goal: maintain IOP in goal range) -Controlled - pt follows with eye doctor regularly -Current treatment  Dorzolamide-timolol (Cosopt) eye drops BID Latanoprost 0.005% eye drops HS Netarsudil (Rhopressa) eye drops AM Pilocarpine 2% eye drops QID -Recommended to continue current medication  Health Maintenance -Vaccine gaps: Covid booster, Prevnar, Shingrix -Advised Prevnar vaccine at pharmacy or next office visit -Hx breast cancer, melanoma, gout. Active ovarian cancer - chemo stopped 10/2020 due to intolerability. Under surveillance with oncology and GYN oncology. -Current therapy:  Fluticasone nasal spray Cranberry Meclizine 25 mg PRN -Patient is satisfied with current therapy and denies issues -Recommended to continue current medication  Patient Goals/Self-Care  Activities Patient will:  - take medications as prescribed as evidenced by patient report and record review focus on medication adherence by pill box check blood pressure 2-3x weekly, document, and provide at future appointments -Reduce levothyroxine by taking 1/2 tablet on Sundays. Repeat thyroid labs in 8-12 weeks.      Ms. Onstad was given information about Chronic Care Management services today including:  CCM service includes personalized support from designated clinical staff supervised by her physician, including individualized plan of care and coordination with other care providers 24/7 contact phone numbers for assistance for urgent and routine care needs. Standard insurance, coinsurance, copays and deductibles apply for chronic care management only during months in which we provide at least 20 minutes of these services. Most insurances cover these services at 100%, however patients may be responsible for any copay, coinsurance and/or deductible if applicable. This service may help you avoid the need for more expensive face-to-face services. Only one practitioner may furnish and bill the service in a calendar month. The patient may stop CCM services at any time (effective at the end of the month) by phone call to the office staff.  Patient agreed to services and verbal consent obtained.   Patient verbalizes understanding of instructions provided today and agrees to view in [place].  Telephone follow up appointment with pharmacy  team member scheduled for: 6 months  Charlene Brooke, PharmD, BCACP Clinical Pharmacist [facility] [phone]

## 2021-04-12 NOTE — Telephone Encounter (Signed)
Called patient daughter she requested by error patient  is not taking and does not need refill.

## 2021-04-12 NOTE — Telephone Encounter (Signed)
Noted  

## 2021-04-15 ENCOUNTER — Other Ambulatory Visit: Payer: Self-pay

## 2021-04-15 ENCOUNTER — Ambulatory Visit
Admission: RE | Admit: 2021-04-15 | Discharge: 2021-04-15 | Disposition: A | Discharge status: Home / Self Care | Payer: Medicare HMO | Source: Ambulatory Visit | Attending: Family Medicine | Admitting: Family Medicine

## 2021-04-15 VITALS — BP 129/78 | HR 60 | Temp 97.5°F | Resp 16

## 2021-04-15 DIAGNOSIS — R1030 Lower abdominal pain, unspecified: Secondary | ICD-10-CM | POA: Diagnosis not present

## 2021-04-15 DIAGNOSIS — N3000 Acute cystitis without hematuria: Secondary | ICD-10-CM | POA: Diagnosis not present

## 2021-04-15 DIAGNOSIS — M069 Rheumatoid arthritis, unspecified: Secondary | ICD-10-CM | POA: Diagnosis not present

## 2021-04-15 LAB — POCT URINALYSIS DIP (MANUAL ENTRY)
Blood, UA: NEGATIVE
Glucose, UA: NEGATIVE mg/dL
Nitrite, UA: NEGATIVE
Protein Ur, POC: 30 mg/dL — AB
Spec Grav, UA: >=1.03 — AB (ref 1.010–1.025)
Urobilinogen, UA: 0.2 E.U./dL
pH, UA: 5.5 (ref 5.0–8.0)

## 2021-04-15 MED ORDER — SULFAMETHOXAZOLE-TRIMETHOPRIM 800-160 MG PO TABS: 1.0000 | ORAL_TABLET | Freq: Two times a day (BID) | ORAL | 0 refills | Status: AC

## 2021-04-15 NOTE — Discharge Instructions (Addendum)
Monitor your blood sugar as this can cause abdominal upset along with diarrhea if you are eating sweets in your glucose is elevated.  Make sure you are drinking plenty of fluids as well.  Your urine has the presence of bacteria and is very concentrated therefore I am treating for urinary tract infection with Bactrim.  Urine culture should result within 2 to 3 days if any additional treatment is warranted we will notify you by phone.  If at any point your abdominal pain worsens or become severe go immediately to the emergency department.

## 2021-04-15 NOTE — ED Provider Notes (Signed)
Shannon Higgins    CSN: [id number] Arrival date & time: 04/15/21  1701      History   Chief Complaint Chief Complaint  Patient presents with   Diarrhea   UTI Symptoms   Abdominal Pain   Back Pain    HPI Shannon Higgins is a 86 y.o. female.   HPI Patient presents today with 3 days of abdominal and low back pain.  Patient has a history of recurrent urinary tract infections.  She is an active chemotherapy patient.  She has been without nausea, vomiting and she is currently afebrile.  She is accompanied today by her daughter who also reports that patient has had diarrhea over the last 3 days however has not had any loose stools today and tolerated eating without GI symptoms. Patient is a diabetic and admits to poor water intake and intakes of sweeten foods. She is not prescribed medication for diabetes-diet controlled. Past Medical History:  Diagnosis Date   Acute metabolic encephalopathy 09/98/3382   Acute tubular injury of transplanted kidney ([place]) 06/14/2016   AKI (acute kidney injury) ([place]) 06/14/2016   Asthma    Breast cancer ([place]) 2015   left   Bronchitis    CAP (community acquired pneumonia) 04/29/2012   Chronic sinusitis    Closed fracture of medial malleolus 05/11/2018   Confusion 12/06/2016   Diabetes mellitus without complication (HCC)    Family history of breast cancer    GERD (gastroesophageal reflux disease)    History of ankle fracture 05/14/2018   Last Assessment & Plan:  With a recent fall. Right medial malleous. Has ortho follow up soon, splinted now and tylenol helps her pain   History of recurrent UTIs    Hypertension    Hypokalemia 04/26/2012   Hyponatremia    Hypothyroidism    Imbalance 09/02/2019   Insomnia    Migraine    Neuropathic pain    Ovarian cancer ([place]) 2022   Ovarian mass, left 07/14/2020   Personal history of breast cancer    Personal history of chemotherapy current   bilateral ovarian ca   Pneumonia    Rheumatoid arthritis ([place])     Sepsis ([place]) 04/26/2012   Sepsis secondary to UTI ([place]) 04/26/2012   Sleep apnea    Stroke ([place])    seen in CT scan   Thyroid disease    Transient hypotension 06/14/2016    Patient Active Problem List   Diagnosis Date Noted   COVID-19 virus infection 12/17/2020   Immunocompromised ([place]) 12/17/2020   Personal history of breast cancer    Family history of breast cancer    Ovarian cancer, bilateral ([place]) 07/21/2020   S/P bilateral oophorectomy 07/21/2020   Preventative health care 03/04/2019   Insomnia 09/27/2018   History of TIA (transient ischemic attack) 09/13/2018   Recurrent UTI 06/20/2018   Frequent falls 05/14/2018   Gout 05/14/2018   Type 2 diabetes mellitus with peripheral neuropathy ([place]) 05/11/2018   Type 2 diabetes mellitus ([place]) 05/11/2018   Orthostatic hypotension 05/08/2018   History of breast cancer 10/17/2017   Major depressive disorder 06/21/2017   Chronic low back pain 01/13/2017   Hyperlipidemia 12/06/2016   Glaucoma 12/06/2016   Neuropathic pain 12/06/2016   Malignant melanoma of skin ([place]) 12/06/2016   Dementia arising in the senium and presenium ([place]) 12/06/2016   Chronic headache disorder 12/06/2016   Hypothyroidism 06/14/2016   Anemia 04/26/2012   Hypertensive disorder 04/26/2012   Gastroesophageal reflux disease 04/26/2012   Obstructive sleep apnea  syndrome 04/26/2012    Past Surgical History:  Procedure Laterality Date   ABDOMINAL HYSTERECTOMY     BACK SURGERY     BREAST BIOPSY Right ?`   benign   BREAST LUMPECTOMY Left 2015   breast ca   CHOLECYSTECTOMY     JOINT REPLACEMENT     LAPAROTOMY N/A 07/14/2020   Procedure: LAPAROTOMY;  Surgeon: Gae Dry, MD;  Location: [facility] ORS;  Service: Gynecology;  Laterality: N/A;   OOPHORECTOMY     OVARIAN CYST REMOVAL     PORTA CATH INSERTION N/A 07/27/2020   Procedure: PORTA CATH INSERTION;  Surgeon: Algernon Huxley, MD;  Location: [place] [facility];  Service: Cardiovascular;  Laterality: N/A;    PORTA CATH REMOVAL N/A 04/05/2021   Procedure: PORTA CATH REMOVAL;  Surgeon: Algernon Huxley, MD;  Location: [place] [facility];  Service: Cardiovascular;  Laterality: N/A;   REPLACEMENT TOTAL KNEE Right    SALPINGOOPHORECTOMY Bilateral 07/14/2020   Procedure: OPEN SALPINGO OOPHORECTOMY;  Surgeon: Gae Dry, MD;  Location: [facility] ORS;  Service: Gynecology;  Laterality: Bilateral;   TOTAL SHOULDER REPLACEMENT Left     OB History     Gravida  2   Para  2   Term  2   Preterm      AB      Living  2      SAB      IAB      Ectopic      Multiple      Live Births               Home Medications    Prior to Admission medications   Medication Sig Start Date End Date Taking? Authorizing Provider  sulfamethoxazole-trimethoprim (BACTRIM DS) 800-160 MG tablet Take 1 tablet by mouth 2 (two) times daily for 7 days. 04/15/21 04/22/21 Yes Scot Jun, FNP  Accu-Chek FastClix Lancets MISC USE AS INSTRUCTED TO TEST BLOOD SUGAR DAILY 10/14/20   Pleas Koch, NP  ACCU-CHEK GUIDE test strip USE AS INSTRUCTED TO TEST BLOOD SUGAR DAILY 10/14/20   Pleas Koch, NP  aspirin EC 325 MG tablet Take 325 mg by mouth daily.    [provider]  blood glucose meter kit and supplies Dispense based on patient and insurance preference. Use up to 2 times daily as directed. (FOR ICD-10 E10.9, E11.9). 03/04/19   Pleas Koch, NP  Botulinum Toxin Type A (BOTOX) 200 units SOLR Inject 155 units IM into multiple sites of face head and neck every 90 days 03/18/21   Pieter Partridge, DO  Cranberry 1000 MG CAPS Take 1 capsule by mouth 2 (two) times daily.    [provider]  donepezil (ARICEPT) 10 MG tablet Take 1 tablet (10 mg total) by mouth at bedtime. 03/02/21   Cameron Sprang, MD  dorzolamide-timolol (COSOPT) 22.3-6.8 MG/ML ophthalmic solution Place 1 drop into both eyes 2 (two) times daily.    [provider]  DULoxetine (CYMBALTA) 30 MG capsule Take 1  capsule (30 mg total) by mouth daily. For depression and pain. 01/15/21   Pleas Koch, NP  fluticasone (FLONASE) 50 MCG/ACT nasal spray Place 2 sprays into both nostrils daily as needed for allergies or rhinitis. 10/14/20   Pleas Koch, NP  gabapentin (NEURONTIN) 800 MG tablet TAKE 1 TABLET BY MOUTH 3 TIMES A DAY FOR NEUROPATHY 04/12/21   Pleas Koch, NP  latanoprost (XALATAN) 0.005 % ophthalmic solution Place 1  drop into both eyes at bedtime.    [provider]  levothyroxine (SYNTHROID) 112 MCG tablet TAKE 1 TABLET EVERY MORNING ON AN EMPTY STOMACH WITH WATER ONLY.  NO FOOD OR OTHER MEDICATIONS FOR 30 MINUTES. Patient taking differently: TAKE 1 TABLET EVERY MORNING except 1/2 tab on Sundays. TAKE ON AN EMPTY STOMACH WITH WATER ONLY.  NO FOOD OR OTHER MEDICATIONS FOR 30 MINUTES. 05/13/20   Pleas Koch, NP  meclizine (ANTIVERT) 25 MG tablet TAKE 1 TABLET (25 MG TOTAL) BY MOUTH 2 (TWO) TIMES DAILY AS NEEDED FOR DIZZINESS. 05/14/20   Pleas Koch, NP  meloxicam (MOBIC) 15 MG tablet TAKE 1 TABLET DAILY AS NEEDED FOR PAIN. 02/04/21   Pleas Koch, NP  Netarsudil Dimesylate (RHOPRESSA) 0.02 % SOLN Place 1 drop into both eyes daily at 6 (six) AM. 12/04/18   [provider]  omeprazole (PRILOSEC) 20 MG capsule TAKE 1 CAPSULE TWICE DAILY FOR HEARTBURN. 10/14/20   Pleas Koch, NP  pilocarpine (PILOCAR) 2 % ophthalmic solution Place 1 drop into both eyes 4 (four) times daily.  02/19/19   [provider]  propranolol (INDERAL) 80 MG tablet Take 1 tablet (80 mg total) by mouth daily. For headache prevention. 12/03/20   Pleas Koch, NP  rosuvastatin (CRESTOR) 10 MG tablet TAKE 1 TABLET DAILY FOR CHOLESTEROL. 12/03/20   Pleas Koch, NP  traZODone (DESYREL) 100 MG tablet Take 2 tablets (200 mg total) by mouth at bedtime as needed for sleep. 11/18/20   Pleas Koch, NP    Family History Family History  Problem Relation Age of Onset    Diabetes Daughter    Hypertension Daughter    Fibromyalgia Daughter    GER disease Daughter    Fibromyalgia Daughter    Crohn's disease Daughter    Asthma Daughter    Hypertension Mother    Breast cancer Other    Breast cancer Niece    Uterine cancer Niece     Social History Social History   Tobacco Use   Smoking status: Never   Smokeless tobacco: Never  Vaping Use   Vaping Use: Never used  Substance Use Topics   Alcohol use: No   Drug use: No     Allergies   Patient has no known allergies.   Review of Systems Review of Systems Pertinent negatives listed in HPI  Physical Exam Triage Vital Signs ED Triage Vitals [04/15/21 1726]  Enc Vitals Group     BP 129/78     Pulse Rate 60     Resp 16     Temp (!) 97.5 F (36.4 C)     Temp Source Oral     SpO2 93 %     Weight      Height      Head Circumference      Peak Flow      Pain Score      Pain Loc      Pain Edu?      Excl. in [place]?    No data found.  Updated Vital Signs BP 129/78 (BP Location: Left Arm)    Pulse 60    Temp (!) 97.5 F (36.4 C) (Oral)    Resp 16    SpO2 93%   Visual Acuity Right Eye Distance:   Left Eye Distance:   Bilateral Distance:    Right Eye Near:   Left Eye Near:    Bilateral Near:     Physical Exam General  appearance: Alert, well developed, well nourished, cooperative, no distress Head: Normocephalic, without obvious abnormality, atraumatic Respiratory: Respirations even and unlabored, normal respiratory Rate Heart: Rate and rhythm normal. No gallop or murmurs noted on exam  Abdomen: BS +, no distention, no rebound tenderness, No CVA tenderness  Extremities: No gross deformities Skin: Skin color, texture, turgor normal. No rashes seen  Neurologic: No obvious focal neurological abnormalities present on exam   Psych: Appropriate mood and affect.   UC Treatments / Results  Labs (all labs ordered are listed, but only abnormal results are displayed) Labs Reviewed  URINE  CULTURE - Abnormal; Notable for the following components:      Result Value   Culture 90,000 COLONIES/mL ESCHERICHIA COLI (*)    Organism ID, Bacteria ESCHERICHIA COLI (*)    All other components within normal limits  POCT URINALYSIS DIP (MANUAL ENTRY) - Abnormal; Notable for the following components:   Clarity, UA cloudy (*)    Bilirubin, UA small (*)    Ketones, POC UA trace (5) (*)    Spec Grav, UA >=1.030 (*)    Protein Ur, POC =30 (*)    Leukocytes, UA Small (1+) (*)    All other components within normal limits    EKG   Radiology No results found.  Procedures Procedures (including critical care time)  Medications Ordered in UC Medications - No data to display  Initial Impression / Assessment and Plan / UC Course  I have reviewed the triage vital signs and the nursing notes.  Pertinent labs & imaging results that were available during my care of the patient were reviewed by me and considered in my medical decision making (see chart for details).     Acute cystitis with lower abdominal pain (non reproducible) UA abnormal and findings consistent with UTI. Empiric antibiotic treatment initiated. Encouraged increase intake of water.  Urine culture pending. ER if symptoms become severe. Follow-up with PCP if symptoms do not completely resolve.    Final Clinical Impressions(s) / UC Diagnoses   Final diagnoses:  Acute cystitis without hematuria  Lower abdominal pain     Discharge Instructions      Monitor your blood sugar as this can cause abdominal upset along with diarrhea if you are eating sweets in your glucose is elevated.  Make sure you are drinking plenty of fluids as well.  Your urine has the presence of bacteria and is very concentrated therefore I am treating for urinary tract infection with Bactrim.  Urine culture should result within 2 to 3 days if any additional treatment is warranted we will notify you by phone.  If at any point your abdominal pain worsens  or become severe go immediately to the emergency department.     ED Prescriptions     Medication Sig Dispense Auth. Provider   sulfamethoxazole-trimethoprim (BACTRIM DS) 800-160 MG tablet Take 1 tablet by mouth 2 (two) times daily for 7 days. 14 tablet Scot Jun, FNP      PDMP not reviewed this encounter.   Scot Jun, FNP 04/19/21 [phone]

## 2021-04-15 NOTE — ED Triage Notes (Signed)
Pt presents with diarrhea that started 3 days ago. She has not had diarrhea since. She has abdominal and back pain x 3 days and a odor to her urine.

## 2021-04-16 ENCOUNTER — Ambulatory Visit (INDEPENDENT_AMBULATORY_CARE_PROVIDER_SITE_OTHER): Payer: Medicare HMO | Admitting: Neurology

## 2021-04-16 DIAGNOSIS — G43709 Chronic migraine without aura, not intractable, without status migrainosus: Secondary | ICD-10-CM

## 2021-04-16 DIAGNOSIS — H401123 Primary open-angle glaucoma, left eye, severe stage: Secondary | ICD-10-CM | POA: Diagnosis not present

## 2021-04-16 MED ORDER — ONABOTULINUMTOXINA 100 UNITS IJ SOLR
200.0000 [IU] | Freq: Once | INTRAMUSCULAR | Status: AC
Start: 2021-04-16 — End: 2021-04-16
  Administered 2021-04-16: 155 [IU] via INTRAMUSCULAR

## 2021-04-16 NOTE — Progress Notes (Signed)
[facility]   Procedure Note Botox  Attending: Dr. Metta Clines  Preoperative Diagnosis(es): Chronic migraine  Consent obtained from: The patient Benefits discussed included, but were not limited to decreased muscle tightness, increased joint range of motion, and decreased pain.  Risk discussed included, but were not limited pain and discomfort, bleeding, bruising, excessive weakness, venous thrombosis, muscle atrophy and dysphagia.  Anticipated outcomes of the procedure as well as he risks and benefits of the alternatives to the procedure, and the roles and tasks of the personnel to be involved, were discussed with the patient, and the patient consents to the procedure and agrees to proceed. A copy of the patient medication guide was given to the patient which explains the blackbox warning.  Patients identity and treatment sites confirmed Yes.  .  Details of Procedure: Skin was cleaned with alcohol. Prior to injection, the needle plunger was aspirated to make sure the needle was not within a blood vessel.  There was no blood retrieved on aspiration.    Following is a summary of the muscles injected  And the amount of Botulinum toxin used:  Dilution 200 units of Botox was reconstituted with 4 ml of preservative free normal saline. Time of reconstitution: At the time of the office visit (<30 minutes prior to injection)   Injections  155 total units of Botox was injected with a 30 gauge needle.  Injection Sites: L occipitalis: 15 units- 3 sites  R occiptalis: 15 units- 3 sites  L upper trapezius: 15 units- 3 sites R upper trapezius: 15 units- 3 sits          L paraspinal: 10 units- 2 sites R paraspinal: 10 units- 2 sites  Face L frontalis(2 injection sites):10 units   R frontalis(2 injection sites):10 units         L corrugator: 5 units   R corrugator: 5 units           Procerus: 5 units   L temporalis: 20 units R temporalis: 20 units   Agent:  200 units of botulinum Type  A (Onobotulinum Toxin type A) was reconstituted with 4 ml of preservative free normal saline.  Time of reconstitution: At the time of the office visit (<30 minutes prior to injection)     Total injected (Units):  155  Total wasted (Units):  5  Patient tolerated procedure well without complications.   Reinjection is anticipated in 3 months.

## 2021-04-17 LAB — URINE CULTURE: Culture: 90000 — AB

## 2021-04-20 ENCOUNTER — Encounter: Payer: Self-pay | Admitting: Neurology

## 2021-04-20 MED ORDER — SUMATRIPTAN SUCCINATE 25 MG PO TABS: 25.0000 mg | ORAL_TABLET | ORAL | 5 refills | Status: DC | PRN

## 2021-04-21 ENCOUNTER — Ambulatory Visit: Payer: Medicare HMO

## 2021-05-05 ENCOUNTER — Other Ambulatory Visit: Payer: Self-pay

## 2021-05-05 ENCOUNTER — Inpatient Hospital Stay: Payer: Medicare HMO | Attending: Oncology | Admitting: Obstetrics and Gynecology

## 2021-05-05 VITALS — BP 121/51 | HR 55 | Temp 96.6°F | Resp 16 | Wt 165.5 lb

## 2021-05-05 DIAGNOSIS — Z853 Personal history of malignant neoplasm of breast: Secondary | ICD-10-CM | POA: Insufficient documentation

## 2021-05-05 DIAGNOSIS — C569 Malignant neoplasm of unspecified ovary: Secondary | ICD-10-CM

## 2021-05-05 DIAGNOSIS — Z8673 Personal history of transient ischemic attack (TIA), and cerebral infarction without residual deficits: Secondary | ICD-10-CM | POA: Insufficient documentation

## 2021-05-05 DIAGNOSIS — C563 Malignant neoplasm of bilateral ovaries: Secondary | ICD-10-CM | POA: Diagnosis not present

## 2021-05-05 NOTE — Progress Notes (Signed)
Gynecologic Oncology Consult Visit   Referring Provider: Dr. 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 female who is seen in consultation from Dr. Kenton Kingfisher for high grade serous ovarian cancer.   Returns for follow up visit.  No new complaints. Occasional diarrhea. Finished four cycles of carbo/doxil in 6/22.  Last two cycles omitted due to poor tolerance.   CA125 was 14 in November.   Gyn Oncology history 04/24/20 admitted to [facility].  Presented to the ER with several day history of weakness, confusion, intermittent fever and thought to have UTI with sepsis.   04/23/20 - CT Abdomen Pelvis w contrast 13.5 cm x 11.8 cm x 15.2 cm simple, cystic appearing area is seen within the pelvis along the midline. This extends from the lower abdomen to the region just above the urinary bladder. Marked severity right-sided hydronephrosis and hydroureter with mild right perinephric inflammatory fat stranding and delayed right renal cortical enhancement. No obstructing renal calculi are identified. The dilated right ureter extends to the level of a large pelvic cyst   Seen by Dr Kenton Kingfisher for the large pelvic mass.  CA 125  22.9 HE4   99 Postmenopausal ROMA 2.65  Underwent Bilateral salpingo-oophorectomy, laparotomy, partial omentectomy with Dr. Kenton Kingfisher and Dr. Gilman Schmidt on 07/14/20.  Controlled drainage of the mass done with purse string with some spillage.  No disease seen outside ovary.    07/14/20  DIAGNOSIS:  A. OVARY AND FALLOPIAN TUBE, LEFT; SALPINGO-OOPHORECTOMY:  - HIGH-GRADE SEROUS CARCINOMA INVOLVING SEROUS CYSTADENOMA OF OVARY, SEE SUMMARY BELOW.  - FALLOPIAN TUBE NEGATIVE FOR INVASIVE AND INTRAEPITHELIAL CARCINOMA.   B. OVARY AND FALLOPIAN TUBE, RIGHT; SALPINGO-OOPHORECTOMY:  - HIGH-GRADE SEROUS CARCINOMA, ONE 1.0 CM FOCUS, INVOLVING FRAGMENTED  OVARY.  - FALLOPIAN TUBE NEGATIVE FOR INVASIVE AND INTRAEPITHELIAL CARCINOMA.   C.  OMENTUM; BIOPSY:  - NEGATIVE FOR MALIGNANCY.   CANCER CASE SUMMARY: OVARY or FALLOPIAN TUBE or PRIMARY PERITONEUM  Standard(s): AJCC-UICC 8, FIGO Cancer Report 2018   SPECIMEN  Procedure: Bilateral salpingo-oophorectomy and omental biopsy  Specimen Integrity:       Left ovary integrity: Capsule ruptured (intraoperative spillage of cyst contents)       Right ovary integrity: Fragmented  TUMOR  Tumor Site: Bilateral ovaries  Tumor Size: Greatest dimension: 4 cm  Histologic Type: High-grade serous carcinoma  Histologic Grade: High-grade  Ovarian Surface Involvement:       Left: Not identified       Right: cannot be determined due to fragmented specimen  Fallopian Tube Surface Involvement: Not identified  Other Tissue/ Organ Involvement: Not applicable  Largest Extrapelvic Peritoneal Focus: Not applicable  Peritoneal/Ascitic Fluid Involvement: Results pending, see [id number]  Chemotherapy Response Score (CRS): Not applicable   REGIONAL LYMPH NODES  Regional Lymph Nodes Status: Not applicable (no regional lymph nodes submitted)   DISTANT METASTASIS  Distant Site(s) Involved, if applicable: Not applicable   ADDENDUM:  Peritoneal/Ascitic Fluid Involvement:  Not identified.  [id number]: pelvic washings negative for malignancy   FINAL PATHOLOGIC STAGE CLASSIFICATION (pTNM, AJCC 8th Edition):  pT1c1 (surgical spill),  pN not assigned (no nodes submitted)  pM - Not applicable   CA 710  10/25/92 98.1  Received four cycles of carboplatin/Doxil post op.  Did not get Taxol due to pre existing neuropathy.   Dr. Janese Banks stopped treatment after 4 cycles due to poor tolerance.   Genetic testing Myriad MyRisk negative  HED testing not done.   Problem  List: Patient Active Problem List   Diagnosis Date Noted   COVID-19 virus infection 12/17/2020   Immunocompromised ([place]) 12/17/2020   Personal history of breast cancer    Family history of breast cancer    Ovarian cancer, bilateral  ([place]) 07/21/2020   S/P bilateral oophorectomy 07/21/2020   Preventative health care 03/04/2019   Insomnia 09/27/2018   History of TIA (transient ischemic attack) 09/13/2018   Recurrent UTI 06/20/2018   Frequent falls 05/14/2018   Gout 05/14/2018   Type 2 diabetes mellitus with peripheral neuropathy ([place]) 05/11/2018   Type 2 diabetes mellitus ([place]) 05/11/2018   Orthostatic hypotension 05/08/2018   History of breast cancer 10/17/2017   Major depressive disorder 06/21/2017   Chronic low back pain 01/13/2017   Hyperlipidemia 12/06/2016   Glaucoma 12/06/2016   Neuropathic pain 12/06/2016   Malignant melanoma of skin ([place]) 12/06/2016   Dementia arising in the senium and presenium ([place]) 12/06/2016   Chronic headache disorder 12/06/2016   Hypothyroidism 06/14/2016   Anemia 04/26/2012   Hypertensive disorder 04/26/2012   Gastroesophageal reflux disease 04/26/2012   Obstructive sleep apnea syndrome 04/26/2012    Past Medical History: Past Medical History:  Diagnosis Date   Acute metabolic encephalopathy 52/84/1324   Acute tubular injury of transplanted kidney ([place]) 06/14/2016   AKI (acute kidney injury) ([place]) 06/14/2016   Asthma    Breast cancer ([place]) 2015   left   Bronchitis    CAP (community acquired pneumonia) 04/29/2012   Chronic sinusitis    Closed fracture of medial malleolus 05/11/2018   Confusion 12/06/2016   Diabetes mellitus without complication (HCC)    Family history of breast cancer    GERD (gastroesophageal reflux disease)    History of ankle fracture 05/14/2018   Last Assessment & Plan:  With a recent fall. Right medial malleous. Has ortho follow up soon, splinted now and tylenol helps her pain   History of recurrent UTIs    Hypertension    Hypokalemia 04/26/2012   Hyponatremia    Hypothyroidism    Imbalance 09/02/2019   Insomnia    Migraine    Neuropathic pain    Ovarian cancer ([place]) 2022   Ovarian mass, left 07/14/2020   Personal history of breast cancer     Personal history of chemotherapy current   bilateral ovarian ca   Pneumonia    Rheumatoid arthritis ([place])    Sepsis ([place]) 04/26/2012   Sepsis secondary to UTI ([place]) 04/26/2012   Sleep apnea    Stroke [facility])    seen in CT scan   Thyroid disease    Transient hypotension 06/14/2016    Past Surgical History: Past Surgical History:  Procedure Laterality Date   ABDOMINAL HYSTERECTOMY     BACK SURGERY     BREAST BIOPSY Right ?`   benign   BREAST LUMPECTOMY Left 2015   breast ca   CHOLECYSTECTOMY     JOINT REPLACEMENT     LAPAROTOMY N/A 07/14/2020   Procedure: LAPAROTOMY;  Surgeon: Gae Dry, MD;  Location: [facility] ORS;  Service: Gynecology;  Laterality: N/A;   OOPHORECTOMY     OVARIAN CYST REMOVAL     PORTA CATH INSERTION N/A 07/27/2020   Procedure: PORTA CATH INSERTION;  Surgeon: Algernon Huxley, MD;  Location: [facility] [place] [facility];  Service: Cardiovascular;  Laterality: N/A;   PORTA CATH REMOVAL N/A 04/05/2021   Procedure: PORTA CATH REMOVAL;  Surgeon: Algernon Huxley, MD;  Location: [place] [facility];  Service: Cardiovascular;  Laterality:  N/A;   REPLACEMENT TOTAL KNEE Right    SALPINGOOPHORECTOMY Bilateral 07/14/2020   Procedure: OPEN SALPINGO OOPHORECTOMY;  Surgeon: Gae Dry, MD;  Location: [facility] ORS;  Service: Gynecology;  Laterality: Bilateral;   TOTAL SHOULDER REPLACEMENT Left     Past Gynecologic History:  Post menopausal  OB History:  OB History  Gravida Para Term Preterm AB Living  _0 SAB IAB Ectopic Multiple Live Births               # Outcome Date GA Lbr Len/2nd Weight Sex Delivery Anes PTL Lv  2 Term           1 Term             Family History: Family History  Problem Relation Age of Onset   Diabetes Daughter    Hypertension Daughter    Fibromyalgia Daughter    GER disease Daughter    Fibromyalgia Daughter    Crohn's disease Daughter    Asthma Daughter    Hypertension Mother    Breast cancer Other    Breast cancer Niece    Uterine  cancer Niece     Social History: Social History   Socioeconomic History   Marital status: Widowed    Spouse name: Not on file   Number of children: 2   Years of education: Not on file   Highest education level: Not on file  Occupational History   Occupation: retired   Tobacco Use   Smoking status: Never   Smokeless tobacco: Never  Vaping Use   Vaping Use: Never used  Substance and Sexual Activity   Alcohol use: No   Drug use: No   Sexual activity: Not Currently  Other Topics Concern   Not on file  Social History Narrative   Pt lives in 1 story home with her daughter, [name] and [name]'s husband   Has 2 adult daughters   Highest level of education: some college   Worked mainly as Web designer.   Social Determinants of Health   Financial Resource Strain: Not on file  Food Insecurity: Not on file  Transportation Needs: Not on file  Physical Activity: Not on file  Stress: Not on file  Social Connections: Not on file  Intimate Partner Violence: Not on file   Immunization History  Administered Date(s) Administered   Fluad Quad(high Dose 65+) 05/05/2020, 01/15/2021   Influenza,inj,Quad PF,6+ Mos 01/29/2018, 03/04/2019   PFIZER(Purple Top)SARS-COV-2 Vaccination 05/21/2019, 06/10/2019, 07/03/2020   Pneumococcal Polysaccharide-23 06/20/2018   Tdap 12/09/2018   Zoster Recombinat (Shingrix) 03/06/2019    Allergies: No Known Allergies  Current Medications: Current Outpatient Medications  Medication Sig Dispense Refill   Accu-Chek FastClix Lancets MISC USE AS INSTRUCTED TO TEST BLOOD SUGAR DAILY 102 each 1   ACCU-CHEK GUIDE test strip USE AS INSTRUCTED TO TEST BLOOD SUGAR DAILY 100 strip 1   aspirin EC 325 MG tablet Take 325 mg by mouth daily.     blood glucose meter kit and supplies Dispense based on patient and insurance preference. Use up to 2 times daily as directed. (FOR ICD-10 E10.9, E11.9). 1 each 0   Botulinum Toxin Type A (BOTOX) 200 units SOLR Inject 155  units IM into multiple sites of face head and neck every 90 days 1 each 4   Cranberry 1000 MG CAPS Take 1 capsule by mouth 2 (two) times daily.     donepezil (ARICEPT) 10 MG tablet Take 1 tablet (10  mg total) by mouth at bedtime. 90 tablet 3   dorzolamide-timolol (COSOPT) 22.3-6.8 MG/ML ophthalmic solution Place 1 drop into both eyes 2 (two) times daily.     DULoxetine (CYMBALTA) 30 MG capsule Take 1 capsule (30 mg total) by mouth daily. For depression and pain. 90 capsule 3   fluticasone (FLONASE) 50 MCG/ACT nasal spray Place 2 sprays into both nostrils daily as needed for allergies or rhinitis. 48 g 0   gabapentin (NEURONTIN) 800 MG tablet TAKE 1 TABLET BY MOUTH 3 TIMES A DAY FOR NEUROPATHY 270 tablet 2   latanoprost (XALATAN) 0.005 % ophthalmic solution Place 1 drop into both eyes at bedtime.     levothyroxine (SYNTHROID) 112 MCG tablet TAKE 1 TABLET EVERY MORNING ON AN EMPTY STOMACH WITH WATER ONLY.  NO FOOD OR OTHER MEDICATIONS FOR 30 MINUTES. (Patient taking differently: TAKE 1 TABLET EVERY MORNING except 1/2 tab on Sundays. TAKE ON AN EMPTY STOMACH WITH WATER ONLY.  NO FOOD OR OTHER MEDICATIONS FOR 30 MINUTES.) 90 tablet 1   meclizine (ANTIVERT) 25 MG tablet TAKE 1 TABLET (25 MG TOTAL) BY MOUTH 2 (TWO) TIMES DAILY AS NEEDED FOR DIZZINESS. 180 tablet 0   meloxicam (MOBIC) 15 MG tablet TAKE 1 TABLET DAILY AS NEEDED FOR PAIN. 90 tablet 0   Netarsudil Dimesylate (RHOPRESSA) 0.02 % SOLN Place 1 drop into both eyes daily at 6 (six) AM.     omeprazole (PRILOSEC) 20 MG capsule TAKE 1 CAPSULE TWICE DAILY FOR HEARTBURN. 180 capsule 1   pilocarpine (PILOCAR) 2 % ophthalmic solution Place 1 drop into both eyes 4 (four) times daily.      propranolol (INDERAL) 80 MG tablet Take 1 tablet (80 mg total) by mouth daily. For headache prevention. 90 tablet 1   rosuvastatin (CRESTOR) 10 MG tablet TAKE 1 TABLET DAILY FOR CHOLESTEROL. 90 tablet 1   SUMAtriptan (IMITREX) 25 MG tablet Take 1 tablet (25 mg total) by  mouth every 2 (two) hours as needed for migraine. May repeat in 2 hours if headache persists or recurs. 10 tablet 5   traZODone (DESYREL) 100 MG tablet Take 2 tablets (200 mg total) by mouth at bedtime as needed for sleep. 180 tablet 1   No current facility-administered medications for this visit.   Review of Systems General:  no complaints Skin: no complaints Eyes: no complaints HEENT: no complaints Breasts: no complaints Pulmonary: no complaints Cardiac: no complaints Gastrointestinal: occasional diarrhea Genitourinary/Sexual: no complaints Ob/Gyn: no complaints Musculoskeletal: no complaints Hematology: no complaints Neurologic/Psych: no complaints  Objective:  Physical Examination:  BP (!) 121/51    Pulse (!) 55    Temp (!) 96.6 F (35.9 C)    Resp 16    Wt 165 lb 8 oz (75.1 kg)    SpO2 95%    BMI 28.41 kg/m     ECOG Performance Status: 1 - Symptomatic but completely ambulatory  GENERAL: Patient is a well appearing female in no acute distress HEENT:  Sclera clear. Anicteric NODES:  Negative axillary, supraclavicular, inguinal lymph node survery LUNGS:  Clear to auscultation bilaterally.   HEART:  Regular rate and rhythm.  ABDOMEN:  Soft, nontender.  No hernias, incisions well healed. No masses or ascites EXTREMITIES:  No peripheral edema. Atraumatic. No cyanosis SKIN:  Clear with no obvious rashes or skin changes.  NEURO:  Nonfocal. Well oriented.  Appropriate affect.  Pelvic: Chaperoned by nursing EGBUS: no lesions Vagina- no discharge, bleeding, or lesions Cervix, Uterus, Ovaries: surgically absent  Rectovaginal: no masses  Lab Review No labs on site today  Radiologic Imaging: No imaging on site today    Assessment:  Shannon Higgins is a 87 y.o. 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  Negative washings, omentum and abdominal survey as well as CT scan A/P. Finished four cycles of carbo/doxil in  6/22.  Last two cycles omitted due to poor tolerance.    CA125 not elevated at diagnosis. Last CA125 in 11/22 normal. No evidence of disease on exam today.   Genetic testing with Myriad MyRisk negative  HRD testing not done.   History of breast cancer age 61.  Medical co-morbidities complicating care: AODM with PN, HTN, stroke. .  Plan:   Problem List Items Addressed This Visit       Endocrine   High grade ovarian cancer ([place]) - Primary   Discussed potential for PARP inhibitor therapy at some point in the future if she has a recurrence and somatic tumor tissue testing for HRD would be useful at that point.    Will see her back for follow up in 6 months and she will see Dr Janese Banks in the interim.   A total of 60 minutes were spent with the patient/family today; 50% was spent in education, counseling and coordination of care for stage ICi HGSOC.    Verlon Au, NP  I personally interviewed and examined the patient. Agreed with the above/below plan of care. I have directly contributed to assessment and plan of care of this patient and educated and discussed with patient and family.  Mellody Drown, MD  CC:  Pleas Koch, NP [address] [place],  [place] [address] [phone]

## 2021-05-10 ENCOUNTER — Other Ambulatory Visit: Payer: Self-pay | Admitting: Primary Care

## 2021-05-10 DIAGNOSIS — E039 Hypothyroidism, unspecified: Secondary | ICD-10-CM

## 2021-05-10 NOTE — Telephone Encounter (Signed)
Patient needs repeat TSH lab only appt for thyroid testing. Please schedule.

## 2021-05-13 ENCOUNTER — Telehealth: Payer: Self-pay

## 2021-05-13 NOTE — Telephone Encounter (Signed)
Left message to return call to our office.  

## 2021-05-13 NOTE — Telephone Encounter (Signed)
New message   Received fax from [facility]  Authorization date 05/02/21 to 05/01/22  Botox medication

## 2021-05-13 NOTE — Telephone Encounter (Signed)
Pt daughter returned call . Would like a call back [phone]

## 2021-05-20 NOTE — Telephone Encounter (Signed)
Left message to return call to our office.  

## 2021-05-28 NOTE — Telephone Encounter (Signed)
Called spoke to daughter have made appointment for next Friday at [place]. She does not think her mom has been taking medications right so not sure how accurate it will be.

## 2021-06-04 ENCOUNTER — Other Ambulatory Visit: Payer: Medicare HMO

## 2021-06-07 NOTE — Telephone Encounter (Signed)
F/u  Fax approval letter to [facility] at  [phone]

## 2021-06-10 NOTE — Progress Notes (Signed)
F/u  I called [facility] they are waiting on patient consent. I clarify the office address and hour of operation   I called and spoke with the patient's daughter just now she is aware they will be calling and will give consent

## 2021-06-10 NOTE — Telephone Encounter (Signed)
F/u  I called [facility] they are waiting on patient consent. I clarify the office address and hour of operation   I called and spoke with the patient's daughter just now she is aware they will be calling and will give consent

## 2021-06-13 DIAGNOSIS — E039 Hypothyroidism, unspecified: Secondary | ICD-10-CM

## 2021-06-13 DIAGNOSIS — E1142 Type 2 diabetes mellitus with diabetic polyneuropathy: Secondary | ICD-10-CM

## 2021-06-14 NOTE — Telephone Encounter (Signed)
Message sent to patient/daughter that you are not in the office today but will review once you get back.

## 2021-06-16 ENCOUNTER — Telehealth: Payer: Self-pay

## 2021-06-16 NOTE — Telephone Encounter (Signed)
Will you coordinate a virtual visit with the patient to discuss glucose readings? She's done one before.  We also need to see if we can have labs drawn where she's residing, [place].

## 2021-06-16 NOTE — Progress Notes (Signed)
Chronic Care Management Pharmacy Assistant   Name: Shannon Higgins  MRN: [id number] DOB: 01/03/1935  Reason for Encounter: CCM (General Adherence)   Recent office visits:  None since last CCM contact  Recent consult visits:  05/05/2021 - Mellody Drown, MD - Obstetrics - Patient presented for high grade ovarian cancer.  04/16/2021 - Metta Clines, DO - Neurology - Patient presented for chronic migraine. Administered: botulinum toxin Type A (BOTOX) injection 200 Units. 04/16/2021 - Jola Schmidt - Ophthalmology - Patient presented for open angle glaucoma, left eye. No other information.  04/15/2021 - [facility] - Patient presented for diarrhea and UTI with low back pain symptoms. Diagnoses: Acute cystitis without hematuria and lower abdominal pain. Start: sulfamethoxazole-trimethoprim (BACTRIM DS) 800-160 MG tablet.   Hospital visits:  None since last CCM contact  Medications: Outpatient Encounter Medications as of 06/16/2021  Medication Sig   Accu-Chek FastClix Lancets MISC USE AS INSTRUCTED TO TEST BLOOD SUGAR DAILY   ACCU-CHEK GUIDE test strip USE AS INSTRUCTED TO TEST BLOOD SUGAR DAILY   aspirin EC 325 MG tablet Take 325 mg by mouth daily.   blood glucose meter kit and supplies Dispense based on patient and insurance preference. Use up to 2 times daily as directed. (FOR ICD-10 E10.9, E11.9).   Botulinum Toxin Type A (BOTOX) 200 units SOLR Inject 155 units IM into multiple sites of face head and neck every 90 days   Cranberry 1000 MG CAPS Take 1 capsule by mouth 2 (two) times daily.   donepezil (ARICEPT) 10 MG tablet Take 1 tablet (10 mg total) by mouth at bedtime.   dorzolamide-timolol (COSOPT) 22.3-6.8 MG/ML ophthalmic solution Place 1 drop into both eyes 2 (two) times daily.   DULoxetine (CYMBALTA) 30 MG capsule Take 1 capsule (30 mg total) by mouth daily. For depression and pain.   fluticasone (FLONASE) 50 MCG/ACT nasal spray Place 2 sprays into both nostrils daily  as needed for allergies or rhinitis.   gabapentin (NEURONTIN) 800 MG tablet TAKE 1 TABLET BY MOUTH 3 TIMES A DAY FOR NEUROPATHY   latanoprost (XALATAN) 0.005 % ophthalmic solution Place 1 drop into both eyes at bedtime.   levothyroxine (SYNTHROID) 112 MCG tablet TAKE 1 TABLET EVERY MORNING ON AN EMPTY STOMACH WITH WATER ONLY Monday through Saturday. Take 1/2 tablet on Sunday.   meclizine (ANTIVERT) 25 MG tablet TAKE 1 TABLET (25 MG TOTAL) BY MOUTH 2 (TWO) TIMES DAILY AS NEEDED FOR DIZZINESS.   meloxicam (MOBIC) 15 MG tablet TAKE 1 TABLET DAILY AS NEEDED FOR PAIN.   Netarsudil Dimesylate (RHOPRESSA) 0.02 % SOLN Place 1 drop into both eyes daily at 6 (six) AM.   omeprazole (PRILOSEC) 20 MG capsule TAKE 1 CAPSULE TWICE DAILY FOR HEARTBURN.   pilocarpine (PILOCAR) 2 % ophthalmic solution Place 1 drop into both eyes 4 (four) times daily.    propranolol (INDERAL) 80 MG tablet Take 1 tablet (80 mg total) by mouth daily. For headache prevention.   rosuvastatin (CRESTOR) 10 MG tablet TAKE 1 TABLET DAILY FOR CHOLESTEROL.   SUMAtriptan (IMITREX) 25 MG tablet Take 1 tablet (25 mg total) by mouth every 2 (two) hours as needed for migraine. May repeat in 2 hours if headache persists or recurs.   traZODone (DESYREL) 100 MG tablet Take 2 tablets (200 mg total) by mouth at bedtime as needed for sleep.   No facility-administered encounter medications on file as of 06/16/2021.   Contacted [facility] on 06/17/2021 for general disease state and medication  adherence call. I spoke with patient's daughter [name]) for the encounter.   Patient is not more than 5 days past due for refill on the following medications per chart history:  [facility] Medications: Medication Name/mg Last Fill Days Supply Rosuvastatin 10 mg  04/12/2021 90  What concerns do you have about your medications? Patient is concerned (as well as her daughter) that the patient was taken off Metformin in the fall (she believes it was in September). Ever  since she has been off the medication her mother's blood glucose numbers have been all over the place. Daughter states the patient is eating a lot of sweets and sugar and not watching what she eats. Her numbers will go in to the high 200s. Patient's daughter knows that Alma Friendly, NP would like a virtual visit with her and mother needs labs work done (has not been set up yet). The best number to reach her mom (Patient is away visiting relatives) is [phone] (Patient's sisters phone).   The patient denies side effects with their medications.   How often do you forget or accidentally miss a dose? Rarely Patient's daughter sets up her mother's medications daily. Patient's daughter is a Marine scientist and make sure her mom takes her medication.   Do you use a pillbox? No - Daughter is in charge of patient's medications.   Are you having any problems getting your medications from your pharmacy? No  Has the cost of your medications been a concern? No  Since last visit with CPP, no interventions have been made.   The patient has not had an ED visit since last contact.   The patient reports the following problems with their health. Please see "What concerns do you have about your medications" note.  Patient reports the following concerns or questions for Debbora Dus, PharmD at this time. Please see "What concerns do you have about your medications" note.  Care Gaps: Annual wellness visit in last year? Yes 01/16/2021 Most Recent BP reading:  121/51 on 05/05/2021   If Diabetic: Most recent A1C reading: 6.3 on 01/15/2021 Last eye exam / retinopathy screening: 09/18/2018 Last diabetic foot exam: Never completed    Upcoming appointments: No appointments scheduled within the next 30 days.  Charlene Brooke, CPP notified  Marijean Niemann, [place] Clinical Pharmacy Assistant [phone]  Time Spent: 35 Minutes

## 2021-06-18 NOTE — Telephone Encounter (Signed)
Called patient set up virtual visit she does not know of any place she can get labs done. She will check and get back to [place] on Monday. She also states she is having UTI symptoms informed her is something that she will need to be seen in person for. If she was not able to come in here she needs to be seen at urgent care.

## 2021-06-19 DIAGNOSIS — R350 Frequency of micturition: Secondary | ICD-10-CM | POA: Diagnosis not present

## 2021-06-19 DIAGNOSIS — N390 Urinary tract infection, site not specified: Secondary | ICD-10-CM | POA: Diagnosis not present

## 2021-06-23 ENCOUNTER — Encounter: Payer: Medicare HMO | Admitting: Primary Care

## 2021-06-23 ENCOUNTER — Other Ambulatory Visit: Payer: Self-pay

## 2021-06-23 NOTE — Telephone Encounter (Signed)
Called patient she was not able to get labs done and not able to log in for virtual. We have decided to cancel visit and make appointment in office. I have called and spoke to daughter she will check schedule and call office to make follow up when she is home. Driving at this time.

## 2021-06-24 NOTE — Progress Notes (Signed)
Patient never evaluated.

## 2021-06-29 NOTE — Telephone Encounter (Addendum)
Patient had virtual visit with PCP 06/23/21 for DM F/U but cancelled due to inability to get labs or login to video. Pt daughter was going to reschedule for in-person visit, it appears nothing has been scheduled yet. Forwarding to scheduling team for assistance with scheduling.

## 2021-07-09 DIAGNOSIS — E1142 Type 2 diabetes mellitus with diabetic polyneuropathy: Secondary | ICD-10-CM

## 2021-07-09 DIAGNOSIS — E039 Hypothyroidism, unspecified: Secondary | ICD-10-CM

## 2021-07-09 DIAGNOSIS — E785 Hyperlipidemia, unspecified: Secondary | ICD-10-CM

## 2021-07-15 DIAGNOSIS — R350 Frequency of micturition: Secondary | ICD-10-CM | POA: Diagnosis not present

## 2021-07-15 DIAGNOSIS — N390 Urinary tract infection, site not specified: Secondary | ICD-10-CM | POA: Diagnosis not present

## 2021-07-16 ENCOUNTER — Other Ambulatory Visit: Payer: Self-pay

## 2021-07-16 ENCOUNTER — Ambulatory Visit: Payer: Medicare HMO | Admitting: Neurology

## 2021-07-16 DIAGNOSIS — G43709 Chronic migraine without aura, not intractable, without status migrainosus: Secondary | ICD-10-CM

## 2021-07-16 MED ORDER — ONABOTULINUMTOXINA 100 UNITS IJ SOLR
200.0000 [IU] | Freq: Once | INTRAMUSCULAR | Status: AC
  Administered 2021-07-16: 155 [IU] via INTRAMUSCULAR

## 2021-07-16 NOTE — Progress Notes (Signed)
[facility]  ° °Procedure Note Botox ° °Attending: Dr. Chandlor Noecker ° °Preoperative Diagnosis(es): Chronic migraine ° °Consent obtained from: The patient °Benefits discussed included, but were not limited to decreased muscle tightness, increased joint range of motion, and decreased pain.  Risk discussed included, but were not limited pain and discomfort, bleeding, bruising, excessive weakness, venous thrombosis, muscle atrophy and dysphagia.  Anticipated outcomes of the procedure as well as he risks and benefits of the alternatives to the procedure, and the roles and tasks of the personnel to be involved, were discussed with the patient, and the patient consents to the procedure and agrees to proceed. A copy of the patient medication guide was given to the patient which explains the blackbox warning. ° °Patients identity and treatment sites confirmed Yes.  . ° °Details of Procedure: °Skin was cleaned with alcohol. Prior to injection, the needle plunger was aspirated to make sure the needle was not within a blood vessel.  There was no blood retrieved on aspiration.   ° °Following is a summary of the muscles injected  And the amount of Botulinum toxin used: ° °Dilution °200 units of Botox was reconstituted with 4 ml of preservative free normal saline. °Time of reconstitution: At the time of the office visit (<30 minutes prior to injection)  ° °Injections  °155 total units of Botox was injected with a 30 gauge needle. ° °Injection Sites: °L occipitalis: 15 units- 3 sites  °R occiptalis: 15 units- 3 sites ° °L upper trapezius: 15 units- 3 sites °R upper trapezius: 15 units- 3 sits          °L paraspinal: 10 units- 2 sites °R paraspinal: 10 units- 2 sites ° °Face °L frontalis(2 injection sites):10 units   °R frontalis(2 injection sites):10 units         °L corrugator: 5 units   °R corrugator: 5 units           °Procerus: 5 units   °L temporalis: 20 units °R temporalis: 20 units  ° °Agent:  °200 units of botulinum Type  A (Onobotulinum Toxin type A) was reconstituted with 4 ml of preservative free normal saline.  °Time of reconstitution: At the time of the office visit (<30 minutes prior to injection)  ° ° ° Total injected (Units):  155 ° Total wasted (Units):  45 ° °Patient tolerated procedure well without complications.   °Reinjection is anticipated in 3 months. ° ° °

## 2021-07-19 ENCOUNTER — Other Ambulatory Visit: Payer: Self-pay | Admitting: Primary Care

## 2021-07-19 DIAGNOSIS — M545 Low back pain, unspecified: Secondary | ICD-10-CM

## 2021-07-19 DIAGNOSIS — K219 Gastro-esophageal reflux disease without esophagitis: Secondary | ICD-10-CM

## 2021-07-19 DIAGNOSIS — G4700 Insomnia, unspecified: Secondary | ICD-10-CM

## 2021-07-22 DIAGNOSIS — H401123 Primary open-angle glaucoma, left eye, severe stage: Secondary | ICD-10-CM | POA: Diagnosis not present

## 2021-07-24 ENCOUNTER — Other Ambulatory Visit: Payer: Self-pay | Admitting: Primary Care

## 2021-07-24 DIAGNOSIS — R42 Dizziness and giddiness: Secondary | ICD-10-CM

## 2021-07-28 ENCOUNTER — Ambulatory Visit (INDEPENDENT_AMBULATORY_CARE_PROVIDER_SITE_OTHER): Payer: Medicare HMO | Admitting: Primary Care

## 2021-07-28 ENCOUNTER — Encounter: Payer: Self-pay | Admitting: Primary Care

## 2021-07-28 ENCOUNTER — Other Ambulatory Visit: Payer: Self-pay

## 2021-07-28 VITALS — BP 110/64 | HR 67 | Temp 97.8°F | Ht 64.0 in | Wt 170.0 lb

## 2021-07-28 DIAGNOSIS — E785 Hyperlipidemia, unspecified: Secondary | ICD-10-CM | POA: Diagnosis not present

## 2021-07-28 DIAGNOSIS — E039 Hypothyroidism, unspecified: Secondary | ICD-10-CM | POA: Diagnosis not present

## 2021-07-28 DIAGNOSIS — R300 Dysuria: Secondary | ICD-10-CM

## 2021-07-28 DIAGNOSIS — E1142 Type 2 diabetes mellitus with diabetic polyneuropathy: Secondary | ICD-10-CM | POA: Diagnosis not present

## 2021-07-28 DIAGNOSIS — N390 Urinary tract infection, site not specified: Secondary | ICD-10-CM

## 2021-07-28 DIAGNOSIS — M069 Rheumatoid arthritis, unspecified: Secondary | ICD-10-CM | POA: Diagnosis not present

## 2021-07-28 LAB — POCT URINALYSIS DIPSTICK
Bilirubin, UA: POSITIVE
Blood, UA: NEGATIVE
Glucose, UA: NEGATIVE
Ketones, UA: POSITIVE
Nitrite, UA: NEGATIVE
Protein, UA: POSITIVE — AB
Spec Grav, UA: 1.025 (ref 1.010–1.025)
Urobilinogen, UA: NEGATIVE E.U./dL — AB
pH, UA: 5 (ref 5.0–8.0)

## 2021-07-28 LAB — LIPID PANEL
Cholesterol: 175 mg/dL (ref 0–200)
HDL: 59.4 mg/dL (ref >39.00)
NonHDL: 115.57
Total CHOL/HDL Ratio: 3
Triglycerides: 302 mg/dL — ABNORMAL HIGH (ref 0.0–149.0)
VLDL: 60.4 mg/dL — ABNORMAL HIGH (ref 0.0–40.0)

## 2021-07-28 LAB — POCT GLYCOSYLATED HEMOGLOBIN (HGB A1C): Hemoglobin A1C: 6.4 % — AB (ref 4.0–5.6)

## 2021-07-28 LAB — TSH: TSH: 1.58 u[IU]/mL (ref 0.35–5.50)

## 2021-07-28 LAB — LDL CHOLESTEROL, DIRECT: Direct LDL: 79 mg/dL

## 2021-07-28 NOTE — Assessment & Plan Note (Addendum)
Unclear if she is taking levothyroxine 100% correctly, but she is taking 112 mcg 6 days weekly and 56 mcg one day weekly. ? ?Repeat TSH pending. ? ?Continue levothyroxine 112 mcg 6 days weekly, 56 mcg 1 day weekly. ?

## 2021-07-28 NOTE — Assessment & Plan Note (Signed)
Controlled with A1c today of 6.4. ? ?Discussed to work on her diet, eat sweets and carbs in moderation.  We also discussed to try walking daily. ? ?Continue off of medication given her age and frailty.  Both she and her daughter agree. ? ?

## 2021-07-28 NOTE — Assessment & Plan Note (Signed)
Continue rosuvastatin 10 mg daily. ?Repeat lipid panel pending. ?

## 2021-07-28 NOTE — Patient Instructions (Signed)
Stop by the lab prior to leaving today. I will notify you of your results once received.  ? ?Eat sweets and carbs in moderation.  ? ?Try to walk daily. ? ?We will be in touch Friday this week regarding your urine culture test. ? ?It was a pleasure to see you today! ? ?

## 2021-07-28 NOTE — Progress Notes (Signed)
? ?Subjective:  ? ? Patient ID: Shannon Higgins, female    DOB: Oct 18, 1934, 87 y.o.   MRN: [id number] ? ?HPI ? ?Shannon Higgins is a very pleasant 87 y.o. female with a 3 of type 2 diabetes, high-grade ovarian cancer, recurrent UTIs, dementia, depression, chronic back pain, recurrent falls who presents today with her daughter to discuss urinary symptoms and hyperglycemia. She is also due for repeat lipid panel and TSH. ? ?1) Type 2 Diabetes: Historically controlled with diet and without medication treatment. Last A1C was 6.3 in September 2022. She does not check her glucose readings at home. She spent 6 weeks at her sister's house in [place] who checked glucose readings a few times which ranged mid 100's to high 200's. She denies symptoms of feeling jittery, increased weakness, diaphoresis.  ? ?She endorses a poor diet, eats sweets frequently, also indulges in carbs.  ? ?2) Dysuria: History of recurrent UTI's. Last episode of cystitis was in December 2022, culture positive with E. coli. ? ?Today she endorses symptoms of pelvic pressure and pain, intermittent, doesn't always experience pressure with urination.  ? ?She presented to Urgent Care in [place] in 3-4 weeks ago, doesn't recall which Urgent Care was prescribed Bactrim DS. She returned to the same Urgent Care 7-10 days later, was told "my urine was worse than before" and was prescribed cephalexin BID x 7 days. She doesn't recall the urgent care sending off a culture.  ? ?She completed her antibiotics one week ago. She denies fevers, hematuria, vaginal symptoms. She does follow with oncology for ovarian cancer. Not currently on chemotherapy or radiation treatment.  ? ?BP Readings from Last 3 Encounters:  ?07/28/21 110/64  ?05/05/21 (!) 121/51  ?04/15/21 129/78  ? ? ? ? ?Review of Systems  ?Constitutional:  Negative for fever.  ?Endocrine: Negative for polydipsia, polyphagia and polyuria.  ?Genitourinary:  Positive for pelvic pain. Negative for dysuria, flank pain,  hematuria and vaginal discharge.  ? ?   ? ? ?Past Medical History:  ?Diagnosis Date  ? Acute metabolic encephalopathy 24/82/5003  ? Acute tubular injury of transplanted kidney ([place]) 06/14/2016  ? AKI (acute kidney injury) ([place]) 06/14/2016  ? Asthma   ? [place]  ? left  ? Bronchitis   ? CAP (community acquired pneumonia) 04/29/2012  ? Chronic sinusitis   ? Closed fracture of medial malleolus 05/11/2018  ? Confusion 12/06/2016  ? Diabetes mellitus without complication ([place])   ? Family history of breast cancer   ? GERD (gastroesophageal reflux disease)   ? History of ankle fracture 05/14/2018  ? Last Assessment & Plan:  With a recent fall. Right medial malleous. Has ortho follow up soon, splinted now and tylenol helps her pain  ? History of recurrent UTIs   ? Hypertension   ? Hypokalemia 04/26/2012  ? Hyponatremia   ? Hypothyroidism   ? Imbalance 09/02/2019  ? Insomnia   ? Migraine   ? Neuropathic pain   ? Ovarian cancer ([place]) 2022  ? Ovarian mass, left 07/14/2020  ? Personal history of breast cancer   ? Personal history of chemotherapy current  ? bilateral ovarian ca  ? Pneumonia   ? Rheumatoid arthritis ([place])   ? Sepsis ([place]) 04/26/2012  ? Sepsis secondary to UTI ([place]) 04/26/2012  ? Sleep apnea   ? Stroke [facility])   ? seen in CT scan  ? Thyroid disease   ? Transient hypotension 06/14/2016  ? ? ?Social History  ? ?Socioeconomic History  ?  Marital status: Widowed  ?  Spouse name: Not on file  ? Number of children: 2  ? Years of education: Not on file  ? Highest education level: Not on file  ?Occupational History  ? Occupation: retired   ?Tobacco Use  ? Smoking status: Never  ? Smokeless tobacco: Never  ?Vaping Use  ? Vaping Use: Never used  ?Substance and Sexual Activity  ? Alcohol use: No  ? Drug use: No  ? Sexual activity: Not Currently  ?Other Topics Concern  ? Not on file  ?Social History Narrative  ? Pt lives in 1 story home with her daughter, Shannon Higgins and [name]'s husband  ? Has 2 adult daughters  ? Highest level of  education: some college  ? Worked mainly as Web designer.  ? ?Social Determinants of Health  ? ?Financial Resource Strain: Not on file  ?Food Insecurity: Not on file  ?Transportation Needs: Not on file  ?Physical Activity: Not on file  ?Stress: Not on file  ?Social Connections: Not on file  ?Intimate Partner Violence: Not on file  ? ? ?Past Surgical History:  ?Procedure Laterality Date  ? ABDOMINAL HYSTERECTOMY    ? BACK SURGERY    ? BREAST BIOPSY Right ?`  ? benign  ? BREAST LUMPECTOMY Left 2015  ? breast ca  ? CHOLECYSTECTOMY    ? JOINT REPLACEMENT    ? LAPAROTOMY N/A 07/14/2020  ? Procedure: LAPAROTOMY;  Surgeon: Gae Dry, MD;  Location: [facility];  Service: Gynecology;  Laterality: N/A;  ? OOPHORECTOMY    ? OVARIAN CYST REMOVAL    ? PORTA CATH INSERTION N/A 07/27/2020  ? Procedure: PORTA CATH INSERTION;  Surgeon: Algernon Huxley, MD;  Location: [facility];  Service: Cardiovascular;  Laterality: N/A;  ? PORTA CATH REMOVAL N/A 04/05/2021  ? Procedure: PORTA CATH REMOVAL;  Surgeon: Algernon Huxley, MD;  Location: [facility];  Service: Cardiovascular;  Laterality: N/A;  ? REPLACEMENT TOTAL KNEE Right   ? SALPINGOOPHORECTOMY Bilateral 07/14/2020  ? Procedure: OPEN SALPINGO OOPHORECTOMY;  Surgeon: Gae Dry, MD;  Location: [facility];  Service: Gynecology;  Laterality: Bilateral;  ? TOTAL SHOULDER REPLACEMENT Left   ? ? ?Family History  ?Problem Relation Age of Onset  ? Diabetes Daughter   ? Hypertension Daughter   ? Fibromyalgia Daughter   ? GER disease Daughter   ? Fibromyalgia Daughter   ? Crohn's disease Daughter   ? Asthma Daughter   ? Hypertension Mother   ? Breast cancer Other   ? Breast cancer Niece   ? Uterine cancer Niece   ? ? ?No Known Allergies ? ?Current Outpatient Medications on File Prior to Visit  ?Medication Sig Dispense Refill  ? Accu-Chek FastClix Lancets MISC USE AS INSTRUCTED TO TEST BLOOD SUGAR DAILY 102 each 1  ? ACCU-CHEK GUIDE test strip USE AS INSTRUCTED TO  TEST BLOOD SUGAR DAILY 100 strip 1  ? aspirin EC 325 MG tablet Take 325 mg by mouth daily.    ? blood glucose meter kit and supplies Dispense based on patient and insurance preference. Use up to 2 times daily as directed. (FOR ICD-10 E10.9, E11.9). 1 each 0  ? Botulinum Toxin Type A (BOTOX) 200 units SOLR Inject 155 units IM into multiple sites of face head and neck every 90 days 1 each 4  ? Cranberry 1000 MG CAPS Take 1 capsule by mouth 2 (two) times daily.    ? donepezil (ARICEPT) 10 MG tablet Take  1 tablet (10 mg total) by mouth at bedtime. 90 tablet 3  ? dorzolamide-timolol (COSOPT) 22.3-6.8 MG/ML ophthalmic solution Place 1 drop into both eyes 2 (two) times daily.    ? DULoxetine (CYMBALTA) 30 MG capsule Take 1 capsule (30 mg total) by mouth daily. For depression and pain. 90 capsule 3  ? fluticasone (FLONASE) 50 MCG/ACT nasal spray USE 2 SPRAYS IN EACH NOSTRIL DAILY AS NEEDED FOR ALLERGIES OR RHINITIS 48 g 0  ? gabapentin (NEURONTIN) 800 MG tablet TAKE 1 TABLET BY MOUTH 3 TIMES A DAY FOR NEUROPATHY 270 tablet 2  ? latanoprost (XALATAN) 0.005 % ophthalmic solution Place 1 drop into both eyes at bedtime.    ? levothyroxine (SYNTHROID) 112 MCG tablet TAKE 1 TABLET EVERY MORNING ON AN EMPTY STOMACH WITH WATER ONLY Monday through Saturday. Take 1/2 tablet on Sunday. 90 tablet 0  ? meclizine (ANTIVERT) 25 MG tablet TAKE 1 TABLET (25 MG TOTAL) BY MOUTH 2 (TWO) TIMES DAILY AS NEEDED FOR DIZZINESS. 180 tablet 0  ? meloxicam (MOBIC) 15 MG tablet TAKE 1 TABLET DAILY AS NEEDED FOR PAIN 90 tablet 0  ? Netarsudil Dimesylate (RHOPRESSA) 0.02 % SOLN Place 1 drop into both eyes daily at 6 (six) AM.    ? omeprazole (PRILOSEC) 20 MG capsule TAKE 1 CAPSULE TWICE DAILY FOR HEARTBURN. 180 capsule 1  ? pilocarpine (PILOCAR) 2 % ophthalmic solution Place 1 drop into both eyes 4 (four) times daily.     ? propranolol (INDERAL) 80 MG tablet Take 1 tablet (80 mg total) by mouth daily. For headache prevention. 90 tablet 1  ? rosuvastatin  (CRESTOR) 10 MG tablet TAKE 1 TABLET DAILY FOR CHOLESTEROL. 90 tablet 1  ? SUMAtriptan (IMITREX) 25 MG tablet Take 1 tablet (25 mg total) by mouth every 2 (two) hours as needed for migraine. May repeat

## 2021-07-28 NOTE — Assessment & Plan Note (Signed)
Two recent antibiotic regimens per Urgent Care without urine culture. ? ?UA today with trace leuks, otherwise unremarkable. ?Culture sent and pending. ? ?We will refrain from treatment until we receive her urine culture result. ?

## 2021-07-29 LAB — URINE CULTURE
MICRO NUMBER:: 13195999
SPECIMEN QUALITY:: ADEQUATE

## 2021-09-03 ENCOUNTER — Other Ambulatory Visit: Payer: Medicare HMO

## 2021-09-03 ENCOUNTER — Ambulatory Visit: Payer: Medicare HMO | Admitting: Oncology

## 2021-09-09 DIAGNOSIS — D1801 Hemangioma of skin and subcutaneous tissue: Secondary | ICD-10-CM | POA: Diagnosis not present

## 2021-09-09 DIAGNOSIS — L538 Other specified erythematous conditions: Secondary | ICD-10-CM | POA: Diagnosis not present

## 2021-09-09 DIAGNOSIS — L298 Other pruritus: Secondary | ICD-10-CM | POA: Diagnosis not present

## 2021-09-09 DIAGNOSIS — R208 Other disturbances of skin sensation: Secondary | ICD-10-CM | POA: Diagnosis not present

## 2021-09-09 DIAGNOSIS — L820 Inflamed seborrheic keratosis: Secondary | ICD-10-CM | POA: Diagnosis not present

## 2021-09-09 DIAGNOSIS — Z08 Encounter for follow-up examination after completed treatment for malignant neoplasm: Secondary | ICD-10-CM | POA: Diagnosis not present

## 2021-09-09 DIAGNOSIS — L821 Other seborrheic keratosis: Secondary | ICD-10-CM | POA: Diagnosis not present

## 2021-09-09 DIAGNOSIS — Z85828 Personal history of other malignant neoplasm of skin: Secondary | ICD-10-CM | POA: Diagnosis not present

## 2021-09-09 DIAGNOSIS — L814 Other melanin hyperpigmentation: Secondary | ICD-10-CM | POA: Diagnosis not present

## 2021-09-20 ENCOUNTER — Inpatient Hospital Stay (HOSPITAL_BASED_OUTPATIENT_CLINIC_OR_DEPARTMENT_OTHER): Payer: Medicare HMO | Admitting: Oncology

## 2021-09-20 ENCOUNTER — Encounter: Payer: Self-pay | Admitting: Oncology

## 2021-09-20 ENCOUNTER — Inpatient Hospital Stay: Payer: Medicare HMO | Attending: Oncology

## 2021-09-20 VITALS — BP 129/67 | HR 55 | Temp 98.6°F | Resp 18 | Wt 168.2 lb

## 2021-09-20 DIAGNOSIS — C563 Malignant neoplasm of bilateral ovaries: Secondary | ICD-10-CM | POA: Insufficient documentation

## 2021-09-20 DIAGNOSIS — E119 Type 2 diabetes mellitus without complications: Secondary | ICD-10-CM | POA: Insufficient documentation

## 2021-09-20 DIAGNOSIS — Z79899 Other long term (current) drug therapy: Secondary | ICD-10-CM | POA: Insufficient documentation

## 2021-09-20 DIAGNOSIS — Z08 Encounter for follow-up examination after completed treatment for malignant neoplasm: Secondary | ICD-10-CM

## 2021-09-20 DIAGNOSIS — C569 Malignant neoplasm of unspecified ovary: Secondary | ICD-10-CM

## 2021-09-20 DIAGNOSIS — E039 Hypothyroidism, unspecified: Secondary | ICD-10-CM | POA: Insufficient documentation

## 2021-09-20 DIAGNOSIS — I10 Essential (primary) hypertension: Secondary | ICD-10-CM | POA: Insufficient documentation

## 2021-09-20 DIAGNOSIS — R309 Painful micturition, unspecified: Secondary | ICD-10-CM

## 2021-09-20 DIAGNOSIS — R300 Dysuria: Secondary | ICD-10-CM | POA: Diagnosis not present

## 2021-09-20 DIAGNOSIS — Z8543 Personal history of malignant neoplasm of ovary: Secondary | ICD-10-CM | POA: Diagnosis not present

## 2021-09-20 DIAGNOSIS — G620 Drug-induced polyneuropathy: Secondary | ICD-10-CM | POA: Diagnosis not present

## 2021-09-20 LAB — CBC WITH DIFFERENTIAL/PLATELET
Abs Immature Granulocytes: 0.02 10*3/uL (ref 0.00–0.07)
Basophils Absolute: 0 10*3/uL (ref 0.0–0.1)
Basophils Relative: 0 %
Eosinophils Absolute: 0.2 10*3/uL (ref 0.0–0.5)
Eosinophils Relative: 2 %
HCT: 38.2 % (ref 36.0–46.0)
Hemoglobin: 12.3 g/dL (ref 12.0–15.0)
Immature Granulocytes: 0 %
Lymphocytes Relative: 22 %
Lymphs Abs: 1.9 10*3/uL (ref 0.7–4.0)
MCH: 31.6 pg (ref 26.0–34.0)
MCHC: 32.2 g/dL (ref 30.0–36.0)
MCV: 98.2 fL (ref 80.0–100.0)
Monocytes Absolute: 0.7 10*3/uL (ref 0.1–1.0)
Monocytes Relative: 9 %
Neutro Abs: 5.7 10*3/uL (ref 1.7–7.7)
Neutrophils Relative %: 67 %
Platelets: 198 10*3/uL (ref 150–400)
RBC: 3.89 MIL/uL (ref 3.87–5.11)
RDW: 13 % (ref 11.5–15.5)
WBC: 8.5 10*3/uL (ref 4.0–10.5)
nRBC: 0 % (ref 0.0–0.2)

## 2021-09-20 LAB — COMPREHENSIVE METABOLIC PANEL
ALT: 14 U/L (ref 0–44)
AST: 22 U/L (ref 15–41)
Albumin: 4.2 g/dL (ref 3.5–5.0)
Alkaline Phosphatase: 59 U/L (ref 38–126)
Anion gap: 6 (ref 5–15)
BUN: 14 mg/dL (ref 8–23)
CO2: 31 mmol/L (ref 22–32)
Calcium: 8.8 mg/dL — ABNORMAL LOW (ref 8.9–10.3)
Chloride: 100 mmol/L (ref 98–111)
Creatinine, Ser: 1.09 mg/dL — ABNORMAL HIGH (ref 0.44–1.00)
GFR, Estimated: 50 mL/min — ABNORMAL LOW (ref >60)
Glucose, Bld: 135 mg/dL — ABNORMAL HIGH (ref 70–99)
Potassium: 4.2 mmol/L (ref 3.5–5.1)
Sodium: 137 mmol/L (ref 135–145)
Total Bilirubin: 0.5 mg/dL (ref 0.3–1.2)
Total Protein: 7.5 g/dL (ref 6.5–8.1)

## 2021-09-20 NOTE — Progress Notes (Signed)
Pt states she has been dealing with pain while urinating; sxs started around 3 days ago.

## 2021-09-21 ENCOUNTER — Telehealth: Payer: Self-pay | Admitting: *Deleted

## 2021-09-21 LAB — URINE CULTURE: Culture: <10000 — AB

## 2021-09-21 LAB — CA 125: Cancer Antigen (CA) 125: 15.1 U/mL (ref 0.0–38.1)

## 2021-09-21 NOTE — Progress Notes (Signed)
Left of message about she does not have uti, if symptoms continue she can certainly go to her PCP and get tested again

## 2021-09-21 NOTE — Telephone Encounter (Signed)
I called the patient and got her voicemail and left her the message that the urine culture that was done showed insignificant bacteria less than 10,000 .  It has to be at least 100,000 of bacteria to confirm that it is a UTI.  At this time you do not need any kind of medication however if it continues to have symptoms that she feels that way you can always get another urine culture at your PCPs office.  Please call us back if you have any questions or concerns and left a direct number of [phone]

## 2021-09-24 ENCOUNTER — Encounter: Payer: Self-pay | Admitting: Oncology

## 2021-09-24 NOTE — Progress Notes (Signed)
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  She will is diagnosed with UTI and as a part of her Work-up had CT abdomen and pelvis with contrast.  That showed a 13.5 11.8 x 15.2 cm simple cystic appearing area within the pelvis.  Ultrasound of the pelvis showed 17 x 10.7 x 11.9 cm complex cystic midline pelvic mass.  Findings indeterminate and could reflect a cystic ovarian neoplasm.  She did have CA-125 checked which was normal at 22.9 but HD4 was elevated at 99.  Patient underwent bilateral salpingo-oophorectomy.  She has had prior hysterectomy.  Pathology showed high-grade serous carcinoma involving both ovaries but no involvement of the fallopian tube.  Omentum was negative for malignancy.  Lymph nodes not sampled.  Tumor size 4 cm high-grade.  FIGO stage IC peritoneal/ascitic fluid involvement was not identified.  Patient has baseline neuropathy. Therefore carbo/doxil chosen for adjuvant chemotherapy upto 6 cycles if tolerated.    Patient completed 4 cycles of Carbo doxil chemotherapy in July 2022 but could not tolerate further doses and was therefore stopped    Interval history-patient  is doing well for her age.  She does report some burning urination today appetite and weight have remained stable.  She denies any abdominal pain distention or leg swelling.  ECOG PS- 1 Pain scale- 0  Review of systems- Review of Systems  Constitutional:  Negative for chills, fever, malaise/fatigue and weight loss.  HENT:  Negative for congestion, ear discharge and nosebleeds.   Eyes:  Negative for blurred vision.  Respiratory:  Negative for cough, hemoptysis, sputum production, shortness of breath and wheezing.   Cardiovascular:  Negative for chest pain, palpitations, orthopnea and claudication.  Gastrointestinal:  Negative for abdominal pain, blood in stool, constipation, diarrhea, heartburn, melena, nausea and vomiting.  Genitourinary:  Negative for dysuria, flank pain, frequency, hematuria and urgency.  Musculoskeletal:  Negative for back pain, joint pain and myalgias.  Skin:  Negative for rash.  Neurological:  Negative for dizziness, tingling, focal weakness, seizures, weakness and headaches.  Endo/Heme/Allergies:  Does not bruise/bleed easily.  Psychiatric/Behavioral:  Negative for depression and suicidal ideas. The patient does not have insomnia.      No Known Allergies   Past Medical History:  Diagnosis Date   Acute metabolic encephalopathy 42/59/5638   Acute tubular injury of transplanted kidney ([place]) 06/14/2016   AKI (acute kidney injury) ([place]) 06/14/2016   Asthma    Breast cancer ([place]) 2015   left   Bronchitis    CAP (community acquired pneumonia) 04/29/2012   Chronic sinusitis    Closed fracture of medial malleolus 05/11/2018   Confusion 12/06/2016   Diabetes mellitus without complication (HCC)    Family history of breast cancer    GERD (gastroesophageal reflux disease)  History of ankle fracture 05/14/2018   Last Assessment & Plan:  With a recent fall. Right medial malleous. Has ortho follow up soon, splinted now and tylenol helps her pain   History of recurrent UTIs     Hypertension    Hypokalemia 04/26/2012   Hyponatremia    Hypothyroidism    Imbalance 09/02/2019   Insomnia    Migraine    Neuropathic pain    Ovarian cancer ([place]) 2022   Ovarian mass, left 07/14/2020   Personal history of breast cancer    Personal history of chemotherapy current   bilateral ovarian ca   Pneumonia    Rheumatoid arthritis ([place])    Sepsis ([place]) 04/26/2012   Sepsis secondary to UTI ([place]) 04/26/2012   Sleep apnea    Stroke [facility])    seen in CT scan   Thyroid disease    Transient hypotension 06/14/2016     Past Surgical History:  Procedure Laterality Date   ABDOMINAL HYSTERECTOMY     BACK SURGERY     BREAST BIOPSY Right ?`   benign   BREAST LUMPECTOMY Left 2015   breast ca   CHOLECYSTECTOMY     JOINT REPLACEMENT     LAPAROTOMY N/A 07/14/2020   Procedure: LAPAROTOMY;  Surgeon: Gae Dry, MD;  Location: [facility] ORS;  Service: Gynecology;  Laterality: N/A;   OOPHORECTOMY     OVARIAN CYST REMOVAL     PORTA CATH INSERTION N/A 07/27/2020   Procedure: PORTA CATH INSERTION;  Surgeon: Algernon Huxley, MD;  Location: [place] CV LAB;  Service: Cardiovascular;  Laterality: N/A;   PORTA CATH REMOVAL N/A 04/05/2021   Procedure: PORTA CATH REMOVAL;  Surgeon: Algernon Huxley, MD;  Location: [place] CV LAB;  Service: Cardiovascular;  Laterality: N/A;   REPLACEMENT TOTAL KNEE Right    SALPINGOOPHORECTOMY Bilateral 07/14/2020   Procedure: OPEN SALPINGO OOPHORECTOMY;  Surgeon: Gae Dry, MD;  Location: [facility] ORS;  Service: Gynecology;  Laterality: Bilateral;   TOTAL SHOULDER REPLACEMENT Left     Social History   Socioeconomic History   Marital status: Widowed    Spouse name: Not on file   Number of children: 2   Years of education: Not on file   Highest education level: Not on file  Occupational History   Occupation: retired   Tobacco Use   Smoking status: Never   Smokeless tobacco: Never  Vaping Use   Vaping Use: Never used  Substance and Sexual  Activity   Alcohol use: No   Drug use: No   Sexual activity: Not Currently  Other Topics Concern   Not on file  Social History Narrative   Pt lives in 1 story home with her daughter, [name] and [name]'s husband   Has 2 adult daughters   Highest level of education: some college   Worked mainly as Web designer.   Social Determinants of Health   Financial Resource Strain: Not on file  Food Insecurity: Not on file  Transportation Needs: Not on file  Physical Activity: Not on file  Stress: Not on file  Social Connections: Not on file  Intimate Partner Violence: Not on file    Family History  Problem Relation Age of Onset   Diabetes Daughter    Hypertension Daughter    Fibromyalgia Daughter    GER disease Daughter    Fibromyalgia Daughter    Crohn's disease Daughter    Asthma Daughter    Hypertension Mother    Breast cancer Other  Breast cancer Niece    Uterine cancer Niece      Current Outpatient Medications:    aspirin EC 325 MG tablet, Take 325 mg by mouth daily., Disp: , Rfl:    Cranberry 1000 MG CAPS, Take 1 capsule by mouth 2 (two) times daily., Disp: , Rfl:    donepezil (ARICEPT) 10 MG tablet, Take 1 tablet (10 mg total) by mouth at bedtime., Disp: 90 tablet, Rfl: 3   DULoxetine (CYMBALTA) 30 MG capsule, Take 1 capsule (30 mg total) by mouth daily. For depression and pain., Disp: 90 capsule, Rfl: 3   fluticasone (FLONASE) 50 MCG/ACT nasal spray, USE 2 SPRAYS IN EACH NOSTRIL DAILY AS NEEDED FOR ALLERGIES OR RHINITIS, Disp: 48 g, Rfl: 0   gabapentin (NEURONTIN) 800 MG tablet, TAKE 1 TABLET BY MOUTH 3 TIMES A DAY FOR NEUROPATHY, Disp: 270 tablet, Rfl: 2   latanoprost (XALATAN) 0.005 % ophthalmic solution, Place 1 drop into both eyes at bedtime., Disp: , Rfl:    levothyroxine (SYNTHROID) 112 MCG tablet, TAKE 1 TABLET EVERY MORNING ON AN EMPTY STOMACH WITH WATER ONLY Monday through Saturday. Take 1/2 tablet on Sunday., Disp: 90 tablet, Rfl: 0   meclizine (ANTIVERT)  25 MG tablet, TAKE 1 TABLET (25 MG TOTAL) BY MOUTH 2 (TWO) TIMES DAILY AS NEEDED FOR DIZZINESS., Disp: 180 tablet, Rfl: 0   Netarsudil Dimesylate (RHOPRESSA) 0.02 % SOLN, Place 1 drop into both eyes daily at 6 (six) AM., Disp: , Rfl:    omeprazole (PRILOSEC) 20 MG capsule, TAKE 1 CAPSULE TWICE DAILY FOR HEARTBURN., Disp: 180 capsule, Rfl: 1   propranolol (INDERAL) 80 MG tablet, Take 1 tablet (80 mg total) by mouth daily. For headache prevention., Disp: 90 tablet, Rfl: 1   rosuvastatin (CRESTOR) 10 MG tablet, TAKE 1 TABLET DAILY FOR CHOLESTEROL., Disp: 90 tablet, Rfl: 1   traZODone (DESYREL) 100 MG tablet, TAKE 2 TABLETS AT BEDTIME AS NEEDED FOR SLEEP, Disp: 180 tablet, Rfl: 1   Accu-Chek FastClix Lancets MISC, USE AS INSTRUCTED TO TEST BLOOD SUGAR DAILY, Disp: 102 each, Rfl: 1   ACCU-CHEK GUIDE test strip, USE AS INSTRUCTED TO TEST BLOOD SUGAR DAILY, Disp: 100 strip, Rfl: 1   blood glucose meter kit and supplies, Dispense based on patient and insurance preference. Use up to 2 times daily as directed. (FOR ICD-10 E10.9, E11.9)., Disp: 1 each, Rfl: 0   Botulinum Toxin Type A (BOTOX) 200 units SOLR, Inject 155 units IM into multiple sites of face head and neck every 90 days, Disp: 1 each, Rfl: 4   dorzolamide-timolol (COSOPT) 22.3-6.8 MG/ML ophthalmic solution, Place 1 drop into both eyes 2 (two) times daily., Disp: , Rfl:    meloxicam (MOBIC) 15 MG tablet, TAKE 1 TABLET DAILY AS NEEDED FOR PAIN (Patient not taking: Reported on 09/20/2021), Disp: 90 tablet, Rfl: 0   pilocarpine (PILOCAR) 2 % ophthalmic solution, Place 1 drop into both eyes 4 (four) times daily. , Disp: , Rfl:    SUMAtriptan (IMITREX) 25 MG tablet, Take 1 tablet (25 mg total) by mouth every 2 (two) hours as needed for migraine. May repeat in 2 hours if headache persists or recurs. (Patient not taking: Reported on 09/20/2021), Disp: 10 tablet, Rfl: 5  Physical exam:  Vitals:   09/20/21 1119  BP: 129/67  Pulse: (!) 55  Resp: 18  Temp:  98.6 F (37 C)  SpO2: 96%  Weight: 168 lb 3.2 oz (76.3 kg)   Physical Exam Constitutional:      General: She  is not in acute distress.    Comments: Sitting in a wheelchair.  Appears in no acute distress  Cardiovascular:     Rate and Rhythm: Normal rate and regular rhythm.     Heart sounds: Normal heart sounds.  Pulmonary:     Effort: Pulmonary effort is normal.     Breath sounds: Normal breath sounds.  Abdominal:     General: Bowel sounds are normal.     Palpations: Abdomen is soft.  Skin:    General: Skin is warm and dry.  Neurological:     Mental Status: She is alert and oriented to person, place, and time.        Latest Ref Rng & Units 09/20/2021   10:59 AM  CMP  Glucose 70 - 99 mg/dL 135    BUN 8 - 23 mg/dL 14    Creatinine 0.44 - 1.00 mg/dL 1.09    Sodium 135 - 145 mmol/L 137    Potassium 3.5 - 5.1 mmol/L 4.2    Chloride 98 - 111 mmol/L 100    CO2 22 - 32 mmol/L 31    Calcium 8.9 - 10.3 mg/dL 8.8    Total Protein 6.5 - 8.1 g/dL 7.5    Total Bilirubin 0.3 - 1.2 mg/dL 0.5    Alkaline Phos 38 - 126 U/L 59    AST 15 - 41 U/L 22    ALT 0 - 44 U/L 14        Latest Ref Rng & Units 09/20/2021   10:59 AM  CBC  WBC 4.0 - 10.5 K/uL 8.5    Hemoglobin 12.0 - 15.0 g/dL 12.3    Hematocrit 36.0 - 46.0 % 38.2    Platelets 150 - 400 K/uL 198       Assessment and plan- Patient is a 87 y.o. female  with high-grade serous carcinoma of the ovary FIGO stage I cpT1 cpNX cMX s/p bilateral salpingo-oophorectomy.  She is s/p 4 cycles of adjuvant carbo Doxil chemotherapy.  She is here for routine follow-up  Clinically patient is doing well with no concerning signs and symptoms of recurrence based on today's exam.  CA125 is normal.  She was seen by Dr. Fransisca Connors in January 2023 and had a normal pelvic exam.She will be seeing GYN oncology in 6 months as well.  I will see her back in 6 months with CBC with differential CMP and CA125  Burning urination/dysuria: We will check urinalysis and  urine culture   Visit Diagnosis 1. Pain passing urine   2. Encounter for follow-up surveillance of ovarian cancer      Dr. Randa Evens, MD, MPH [facility] [phone] 09/24/2021 5:57 PM

## 2021-10-01 ENCOUNTER — Telehealth: Payer: Self-pay

## 2021-10-01 NOTE — Progress Notes (Signed)
Tried calling patient to advised of Consent needed for Botox delivery. No answer, LMOVM please give the office and [facility] a call to set up delivery for Botox.   Botox can be set up due to Needing Patient consent. Per Rep: Pilar Plate.

## 2021-10-01 NOTE — Progress Notes (Signed)
    Chronic Care Management Pharmacy Assistant   Name: JACKLYNN DEHAAS  MRN: [id number] DOB: 1934/05/09  Reason for Encounter: CCM (Appointment Reminder)  Medications: Outpatient Encounter Medications as of 10/01/2021  Medication Sig   Accu-Chek FastClix Lancets MISC USE AS INSTRUCTED TO TEST BLOOD SUGAR DAILY   ACCU-CHEK GUIDE test strip USE AS INSTRUCTED TO TEST BLOOD SUGAR DAILY   aspirin EC 325 MG tablet Take 325 mg by mouth daily.   blood glucose meter kit and supplies Dispense based on patient and insurance preference. Use up to 2 times daily as directed. (FOR ICD-10 E10.9, E11.9).   Botulinum Toxin Type A (BOTOX) 200 units SOLR Inject 155 units IM into multiple sites of face head and neck every 90 days   Cranberry 1000 MG CAPS Take 1 capsule by mouth 2 (two) times daily.   donepezil (ARICEPT) 10 MG tablet Take 1 tablet (10 mg total) by mouth at bedtime.   dorzolamide-timolol (COSOPT) 22.3-6.8 MG/ML ophthalmic solution Place 1 drop into both eyes 2 (two) times daily.   DULoxetine (CYMBALTA) 30 MG capsule Take 1 capsule (30 mg total) by mouth daily. For depression and pain.   fluticasone (FLONASE) 50 MCG/ACT nasal spray USE 2 SPRAYS IN EACH NOSTRIL DAILY AS NEEDED FOR ALLERGIES OR RHINITIS   gabapentin (NEURONTIN) 800 MG tablet TAKE 1 TABLET BY MOUTH 3 TIMES A DAY FOR NEUROPATHY   latanoprost (XALATAN) 0.005 % ophthalmic solution Place 1 drop into both eyes at bedtime.   levothyroxine (SYNTHROID) 112 MCG tablet TAKE 1 TABLET EVERY MORNING ON AN EMPTY STOMACH WITH WATER ONLY Monday through Saturday. Take 1/2 tablet on Sunday.   meclizine (ANTIVERT) 25 MG tablet TAKE 1 TABLET (25 MG TOTAL) BY MOUTH 2 (TWO) TIMES DAILY AS NEEDED FOR DIZZINESS.   meloxicam (MOBIC) 15 MG tablet TAKE 1 TABLET DAILY AS NEEDED FOR PAIN (Patient not taking: Reported on 09/20/2021)   Netarsudil Dimesylate (RHOPRESSA) 0.02 % SOLN Place 1 drop into both eyes daily at 6 (six) AM.   omeprazole (PRILOSEC) 20 MG capsule  TAKE 1 CAPSULE TWICE DAILY FOR HEARTBURN.   pilocarpine (PILOCAR) 2 % ophthalmic solution Place 1 drop into both eyes 4 (four) times daily.    propranolol (INDERAL) 80 MG tablet Take 1 tablet (80 mg total) by mouth daily. For headache prevention.   rosuvastatin (CRESTOR) 10 MG tablet TAKE 1 TABLET DAILY FOR CHOLESTEROL.   SUMAtriptan (IMITREX) 25 MG tablet Take 1 tablet (25 mg total) by mouth every 2 (two) hours as needed for migraine. May repeat in 2 hours if headache persists or recurs. (Patient not taking: Reported on 09/20/2021)   traZODone (DESYREL) 100 MG tablet TAKE 2 TABLETS AT BEDTIME AS NEEDED FOR SLEEP   No facility-administered encounter medications on file as of 10/01/2021.   JOURDEN DELMONT was contacted to remind of upcoming telephone visit with Charlene Brooke on 10/06/2021 at 11:00. Patient was reminded to have any blood glucose and blood pressure readings available for review at appointment.   Message was left reminding patient of appointment.  CCM referral has been placed prior to visit?  No   Star Rating Drugs: Medication:  Last Fill: Day Supply Rosuvastatin 10 mg 06/24/2021 90 Fill date verified with [facility]  Charlene Brooke, CPP notified  Marijean Niemann, [place] Pharmacy Assistant [phone]

## 2021-10-02 ENCOUNTER — Other Ambulatory Visit: Payer: Self-pay | Admitting: Primary Care

## 2021-10-02 DIAGNOSIS — E039 Hypothyroidism, unspecified: Secondary | ICD-10-CM

## 2021-10-06 ENCOUNTER — Ambulatory Visit: Payer: Medicare HMO | Admitting: Pharmacist

## 2021-10-06 DIAGNOSIS — E1142 Type 2 diabetes mellitus with diabetic polyneuropathy: Secondary | ICD-10-CM

## 2021-10-06 DIAGNOSIS — F0390 Unspecified dementia without behavioral disturbance: Secondary | ICD-10-CM

## 2021-10-06 DIAGNOSIS — I10 Essential (primary) hypertension: Secondary | ICD-10-CM

## 2021-10-06 DIAGNOSIS — F325 Major depressive disorder, single episode, in full remission: Secondary | ICD-10-CM

## 2021-10-06 DIAGNOSIS — E039 Hypothyroidism, unspecified: Secondary | ICD-10-CM

## 2021-10-06 DIAGNOSIS — E785 Hyperlipidemia, unspecified: Secondary | ICD-10-CM

## 2021-10-06 NOTE — Patient Instructions (Signed)
Visit Information  Phone number for Pharmacist: [phone]   Goals Addressed   None     Care Plan : [place]  Updates made by Charlton Haws, [place] since 10/06/2021 12:00 AM     Problem: Hypertension, Hyperlipidemia, Diabetes, Hypothyroidism, and Depression, Dementia   Priority: High     Long-Range Goal: Disease mgmt   Start Date: 04/11/2021  Expected End Date: 04/11/2022  Recent Progress: On track  Priority: High  Note:   Current Barriers:  None identified  Pharmacist Clinical Goal(s):  Patient will contact provider office for questions/concerns as evidenced notation of same in electronic health record through collaboration with PharmD and provider.   Interventions: 1:1 collaboration with Pleas Koch, NP regarding development and update of comprehensive plan of care as evidenced by provider attestation and co-signature Inter-disciplinary care team collaboration (see longitudinal plan of care) Comprehensive medication review performed; medication list updated in electronic medical record  Hypertension (BP goal <140/90) -Controlled - BP at goal in recent clinic visits; Denies hypotensive/hypertensive symptoms -Current treatment: Propranolol 80 mg daily (headaches) - Appropriate, Effective, Safe, Accessible -Educated on BP goals and benefits of medications for prevention of heart attack, stroke and kidney damage; Daily salt intake goal < 2300 mg; Importance of home blood pressure monitoring; -Counseled to monitor BP at home 2-3x weekly -Recommended to continue current medication  Hyperlipidemia: (LDL goal < 100) -Controlled - LDL 79 (06/2021) at goal;  pt endorses compliance with statin; she takes full dose aspirin due to hx of TIA; she denies bleeding issues -Current treatment: Rosuvastatin 10 mg daily AM - Appropriate, Effective, Safe, Accessible Aspirin EC 325 mg daily - Appropriate, Effective, Safe, Accessible -Educated on Cholesterol goals;  Benefits of statin for ASCVD risk reduction; -Recommended to continue current medication  Diabetes (A1c goal <7%) -Diet-controlled  - A1c is at goal in pre-diabetic range (6.4% - 06/2021) -Medications previously tried: metformin  -Educated on A1c and blood sugar goals; -Counseled to check feet daily and get yearly eye exams -Counseled on diet and exercise extensively  Pain (Goal: manage symptoms) -Controlled - per pt report -Current treatment  Duloxetine 30 mg daily -Appropriate, Effective, Safe, Accessible Meloxicam 15 mg daily PRN -Appropriate, Effective, Safe, Accessible Gabapentin 800 mg TID - 800 AM, 1600 mg PM -Appropriate, Effective, Safe, Accessible -Recommended to continue current medication  Depression/Anxiety/Insomnia (Goal: manage symptoms) -Controlled - pt reports she sleeps 5-6 hours per night usually and feels relatively rested -Current treatment: Duloxetine 30 mg daily -Appropriate, Effective, Safe, Accessible Trazodone 100 mg - 2 tab HS prn -Appropriate, Effective, Safe, Accessible -Educated on Benefits of medication for symptom control -Recommended to continue current medication  Dementia (Goal: slow progression) -Controlled - pt endorses compliance; -Current treatment  Donepezil 10 mg daily HS -Appropriate, Effective, Safe, Accessible -Counseled on role of medication to slow progression of disease -Recommended to continue current medication  Headaches (Goal: reduce frequency) -Controlled - pt reports significant improvement in migraine frequency since starting Botox injections -Current treatment  Propranolol 80 mg daily Appropriate, Effective, Safe, Accessible Botox injections q3 months -Appropriate, Effective, Safe, Accessible -Recommended to continue current medication  Hypothyroidism (Goal: maintain TSH in goal range) -Controlled - TSH at goal, compliance with 1/2 dose/week has improved since levothyroxine was added to pill box -Current treatment   Levothyroxine 112 mcg daily except 1/2 tab once a week - Appropriate, Effective, Safe, Accessible -Counseled on consequences of elevated thyroid hormone, which can include worsening headache -Recommend to continue current medication  Health Maintenance -  Vaccine gaps: Covid booster, Prevnar, Shingrix -Hx breast cancer, melanoma, gout. Active ovarian cancer - chemo stopped 10/2020 due to intolerability. Under surveillance with oncology and GYN oncology.  Patient Goals/Self-Care Activities Patient will:  - take medications as prescribed as evidenced by patient report and record review focus on medication adherence by pill box check blood pressure 2-3x weekly, document, and provide at future appointments      Patient verbalizes understanding of instructions and care plan provided today and agrees to view in [place]. Active MyChart status and patient understanding of how to access instructions and care plan via MyChart confirmed with patient.    Telephone follow up appointment with pharmacy team member scheduled for: 1 year  Charlene Brooke, PharmD, [facility] Clinical Pharmacist [facility] [phone]

## 2021-10-06 NOTE — Progress Notes (Signed)
Chronic Care Management Pharmacy Note  10/06/2021 Name:  Shannon Higgins MRN:  [id number] DOB:  05-05-1934  Summary: CCM F/U visit -Reviewed medications; pt affirms compliance as prescribed -Pt has added levothyroxine to pill box (including 1/2 tab once a week) which has improved compliance  Recommendations/Changes made from today's visit: -No med changes  Plan: -[place] will call patient in 6 months for general update -Pharmacist follow up televisit scheduled for 1 year    Subjective: Shannon Higgins is an 87 y.o. year old female who is a primary patient of Pleas Koch, NP.  The CCM team was consulted for assistance with disease management and care coordination needs.    Engaged with patient by telephone for follow up visit in response to provider referral for pharmacy case management and/or care coordination services.   Consent to Services:  The patient was given information about Chronic Care Management services, agreed to services, and gave verbal consent prior to initiation of services.  Please see initial visit note for detailed documentation.   Patient Care Team: Pleas Koch, NP as PCP - General (Internal Medicine) Cameron Sprang, MD as Consulting Physician (Neurology) Clent Jacks, RN as Oncology Nurse Navigator [place], [place], [facility] as Pharmacist (Pharmacist) Cameron Sprang, MD as Consulting Physician (Neurology)  Spoke with patient and daughter Shannon Higgins for CCM visits.  Recent office visits: 07/28/21 NP Allie Bossier OV: chronic f/u; recurrent UTI - Ucx negative; a1c 6.4, off meds d/t age, frailty.  01/15/2021 - Alma Friendly, NP - Patient presented for chronic low back pain. Labs: Lipid, TSH, A1c and LDL. Referral to home health. TSH low, reduce levothyroxine by 1/2 tablet once a week.  Recent consult visits: 09/20/21 Dr Janese Banks (oncology): pain passing urine. Ucx insignificant growth, no abx. 09/09/21 Dr Anabel Bene (Dermatology): f/u  seborrheic keratotis. 07/16/21 Dr Tomi Likens (Neurology): f/u migraine, Botox inj given. 05/05/2021 - Mellody Drown, MD - OB/GYN - f/u ovarian cancer. 04/16/2021 - Metta Clines, DO - Neurology - migraine. Botox injection given. 04/16/2021 - Jola Schmidt - Ophthalmology - glaucoma 04/15/2021 - [facility] - UTI - start Bactrim. 04/05/2021 - Leotis Pain, MD - Cardiovascular - Porta cath removal. 03/31/2021 - Randa Evens - Oncology - f/u ovarian cancer. Labs stable, no changes. 03/23/2021 - Lucilla Lame, MD - Gastroenterology - f/u dysphagia. No changes. 03/02/2021 - Ellouise Newer, MD - Neurology - f/u dementia. No changes 02/01/2021 - Jola Schmidt - Ophthalmology - f/u glaucoma 01/15/2021 - Metta Clines, DO - Neurology - f/u migraine. Botox injection given. 01/12/2021 - Jola Schmidt - Ophthalmology - Glaucoma f/u 12/29/2020 -  Urgent Care - Patient presented for unresolved cough since had covid 10 days ago and return of fever yesterday. Start: azithromycin (ZITHROMAX) 250 MG tablet. 11/24/2020 - Randa Evens, MD - Patient presented for encounter for antineoplastic chemotherapy. From chart: Patient and her daughter are concerned that she has slowly declined since the start of chemotherapy.  She has more fatigue and tends to spend more time in bed.  There are times when she is confused as well.  In order to prevent any further decline in her quality of life they are leaning towards stopping all chemotherapy at this time which would be reasonable given her age. No chemotherapy given. PT will need periodic pelvic exams by GYN oncology.  11/05/2020 - Randa Evens, MD - Oncology - Patient Message - Patient and daughter are in agreement to STOP chemotherapy due to patient being less sharp  and a little confused (per daughter).    Hospital visits: None in previous 6 months   Objective:  Lab Results  Component Value Date   CREATININE 1.09 (H) 09/20/2021   BUN 14 09/20/2021   GFR 57.78 (L)  05/05/2020   GFRNONAA 50 (L) 09/20/2021   GFRAA >60 01/20/2020   NA 137 09/20/2021   K 4.2 09/20/2021   CALCIUM 8.8 (L) 09/20/2021   CO2 31 09/20/2021   GLUCOSE 135 (H) 09/20/2021    Lab Results  Component Value Date/Time   HGBA1C 6.4 (A) 07/28/2021 11:44 AM   HGBA1C 6.3 01/15/2021 12:50 PM   HGBA1C 6.8 (H) 07/14/2020 02:38 PM   GFR 57.78 (L) 05/05/2020 03:43 PM   GFR 61.47 09/02/2019 04:06 PM   MICROALBUR 12.8 (H) 12/29/2017 03:08 PM    Last diabetic Eye exam:  Lab Results  Component Value Date/Time   HMDIABEYEEXA No Retinopathy 09/18/2018 12:00 AM    Last diabetic Foot exam: No results found for: HMDIABFOOTEX   Lab Results  Component Value Date   CHOL 175 07/28/2021   HDL 59.40 07/28/2021   LDLCALC 65 03/04/2019   LDLDIRECT 79.0 07/28/2021   TRIG 302.0 (H) 07/28/2021   CHOLHDL 3 07/28/2021       Latest Ref Rng & Units 09/20/2021   10:59 AM 03/31/2021    2:20 PM 11/24/2020    9:02 AM  Hepatic Function  Total Protein 6.5 - 8.1 g/dL 7.5   7.2   6.9    Albumin 3.5 - 5.0 g/dL 4.2   4.0   4.0    AST 15 - 41 U/L 22   22   22     ALT 0 - 44 U/L 14   13   11     Alk Phosphatase 38 - 126 U/L 59   65   80    Total Bilirubin 0.3 - 1.2 mg/dL 0.5   0.4   0.7      Lab Results  Component Value Date/Time   TSH 1.58 07/28/2021 12:10 PM   TSH 0.23 (L) 01/15/2021 12:50 PM       Latest Ref Rng & Units 09/20/2021   10:59 AM 03/31/2021    2:20 PM 11/24/2020    9:02 AM  CBC  WBC 4.0 - 10.5 K/uL 8.5   8.8   9.8    Hemoglobin 12.0 - 15.0 g/dL 12.3   12.5   10.3    Hematocrit 36.0 - 46.0 % 38.2   38.4   33.1    Platelets 150 - 400 K/uL 198   213   296      Lab Results  Component Value Date/Time   VD25OH 35.31 06/21/2017 04:16 PM    Clinical ASCVD: No  The ASCVD Risk score (Arnett DK, et al., 2019) failed to calculate for the following reasons:   The 2019 ASCVD risk score is only valid for ages 35 to 43       01/16/2021   11:06 AM 01/16/2021   11:05 AM 05/05/2020    3:13  PM  Depression screen PHQ 2/9  Decreased Interest 0 0 0  Down, Depressed, Hopeless 0 0 0  PHQ - 2 Score 0 0 0  Altered sleeping 0  0  Tired, decreased energy 3  0  Change in appetite 0  0  Feeling bad or failure about yourself  0  0  Trouble concentrating 0  0  Moving slowly or fidgety/restless 0  0  Suicidal thoughts 0  0  PHQ-9 Score 3  0  Difficult doing work/chores Not difficult at all  Not difficult at all     Social History   Tobacco Use  Smoking Status Never  Smokeless Tobacco Never   BP Readings from Last 3 Encounters:  09/20/21 129/67  07/28/21 110/64  05/05/21 (!) 121/51   Pulse Readings from Last 3 Encounters:  09/20/21 (!) 55  07/28/21 67  05/05/21 (!) 55   Wt Readings from Last 3 Encounters:  09/20/21 168 lb 3.2 oz (76.3 kg)  07/28/21 170 lb (77.1 kg)  06/23/21 165 lb (74.8 kg)   BMI Readings from Last 3 Encounters:  09/20/21 28.87 kg/m  07/28/21 29.18 kg/m  06/23/21 28.32 kg/m    Assessment/Interventions: Review of patient past medical history, allergies, medications, health status, including review of consultants reports, laboratory and other test data, was performed as part of comprehensive evaluation and provision of chronic care management services.   SDOH:  (Social Determinants of Health) assessments and interventions performed: Yes  SDOH Interventions    Flowsheet Row Most Recent Value  SDOH Interventions   Food Insecurity Interventions Intervention Not Indicated  Financial Strain Interventions Intervention Not Indicated      SDOH Screenings   Alcohol Screen: Not on file  Depression (PHQ2-9): Low Risk    PHQ-2 Score: 3  Financial Resource Strain: Low Risk    Difficulty of Paying Living Expenses: Not very hard  Food Insecurity: No Food Insecurity   Worried About Charity fundraiser in the Last Year: Never true   Ran Out of Food in the Last Year: Never true  Housing: Not on file  Physical Activity: Not on file  Social  Connections: Not on file  Stress: Not on file  Tobacco Use: Low Risk    Smoking Tobacco Use: Never   Smokeless Tobacco Use: Never   Passive Exposure: Not on file  Transportation Needs: Not on file    [place]  No Known Allergies  Medications Reviewed Today     Reviewed by Charlton Haws, [facility] (Pharmacist) on 10/06/21 at [place] List Status: <None>   Medication Order Taking? Sig Documenting Provider Last Dose Status Informant  Accu-Chek FastClix Lancets MISC [id number] Yes USE AS INSTRUCTED TO TEST BLOOD SUGAR DAILY Pleas Koch, NP Taking Active   ACCU-CHEK GUIDE test strip [id number] Yes USE AS INSTRUCTED TO TEST BLOOD SUGAR DAILY Pleas Koch, NP Taking Active   aspirin EC 325 MG tablet [id number] Yes Take 325 mg by mouth daily. [provider] Taking Active Family Member  blood glucose meter kit and supplies [id number] Yes Dispense based on patient and insurance preference. Use up to 2 times daily as directed. (FOR ICD-10 E10.9, E11.9). Pleas Koch, NP Taking Active   Botulinum Toxin Type A (BOTOX) 200 units SOLR [id number] Yes Inject 155 units IM into multiple sites of face head and neck every 90 days Pieter Partridge, DO Taking Active   Cranberry 1000 MG CAPS [id number] Yes Take 1 capsule by mouth 2 (two) times daily. [provider] Taking Active            Med Note Charlton Haws   Wed Apr 07, 2021  3:00 PM)    donepezil (ARICEPT) 10 MG tablet [id number] Yes Take 1 tablet (10 mg total) by mouth at bedtime. Cameron Sprang, MD Taking Active   dorzolamide-timolol (COSOPT) 22.3-6.8 MG/ML ophthalmic solution [id number] Yes Place 1 drop into both eyes 2 (two)  times daily. [provider] Taking Active Family Member  DULoxetine (CYMBALTA) 30 MG capsule [id number] Yes Take 1 capsule (30 mg total) by mouth daily. For depression and pain. Pleas Koch, NP Taking Active   fluticasone [facility]) 50 MCG/ACT nasal spray [id number] Yes USE  2 SPRAYS IN EACH NOSTRIL DAILY AS NEEDED FOR ALLERGIES OR RHINITIS Pleas Koch, NP Taking Active   gabapentin (NEURONTIN) 800 MG tablet [id number] Yes TAKE 1 TABLET BY MOUTH 3 TIMES A DAY FOR NEUROPATHY Pleas Koch, NP Taking Active   latanoprost (XALATAN) 0.005 % ophthalmic solution [id number] Yes Place 1 drop into both eyes at bedtime. [provider] Taking Active Family Member  levothyroxine (SYNTHROID) 112 MCG tablet [id number] Yes TAKE 1 TABLET EVERY MORNING MONDAY THROUGH SATURDAY AND 1/2 TABLET ON SUNDAY. TAKE ON AN EMPTY STOMACH WITH WATER ONLY Pleas Koch, NP Taking Active   meclizine (ANTIVERT) 25 MG tablet [id number] Yes TAKE 1 TABLET (25 MG TOTAL) BY MOUTH 2 (TWO) TIMES DAILY AS NEEDED FOR DIZZINESS. Pleas Koch, NP Taking Active Family Member  meloxicam [facility]) 15 MG tablet [id number] Yes TAKE 1 TABLET DAILY AS NEEDED FOR PAIN Pleas Koch, NP Taking Active   Netarsudil Dimesylate (RHOPRESSA) 0.02 % SOLN [id number] Yes Place 1 drop into both eyes daily at 6 (six) AM. [provider] Taking Active Family Member  omeprazole (PRILOSEC) 20 MG capsule [id number] Yes TAKE 1 CAPSULE TWICE DAILY FOR HEARTBURN. Pleas Koch, NP Taking Active   pilocarpine [facility]) 2 % ophthalmic solution [id number] Yes Place 1 drop into both eyes 4 (four) times daily.  [provider] Taking Active Family Member  propranolol (INDERAL) 80 MG tablet [id number] Yes Take 1 tablet (80 mg total) by mouth daily. For headache prevention. Pleas Koch, NP Taking Active   rosuvastatin (CRESTOR) 10 MG tablet [id number] Yes TAKE 1 TABLET DAILY FOR CHOLESTEROL. Pleas Koch, NP Taking Active   SUMAtriptan (IMITREX) 25 MG tablet [id number] Yes Take 1 tablet (25 mg total) by mouth every 2 (two) hours as needed for migraine. May repeat in 2 hours if headache persists or recurs. Cameron Sprang, MD Taking Active   traZODone (DESYREL) 100 MG tablet [id number] Yes  TAKE 2 TABLETS AT BEDTIME AS NEEDED FOR SLEEP Pleas Koch, NP Taking Active             Patient Active Problem List   Diagnosis Date Noted   COVID-19 virus infection 12/17/2020   Immunocompromised ([place]) 12/17/2020   Personal history of breast cancer    Family history of breast cancer    High grade ovarian cancer ([place]) 07/21/2020   S/P bilateral oophorectomy 07/21/2020   Preventative health care 03/04/2019   Insomnia 09/27/2018   History of TIA (transient ischemic attack) 09/13/2018   Recurrent UTI 06/20/2018   Frequent falls 05/14/2018   Gout 05/14/2018   Type 2 diabetes mellitus with peripheral neuropathy ([place]) 05/11/2018   Type 2 diabetes mellitus ([place]) 05/11/2018   Orthostatic hypotension 05/08/2018   History of breast cancer 10/17/2017   Major depressive disorder 06/21/2017   Chronic low back pain 01/13/2017   Hyperlipidemia 12/06/2016   Glaucoma 12/06/2016   Neuropathic pain 12/06/2016   Malignant melanoma of skin ([place]) 12/06/2016   Dementia arising in the senium and presenium ([place]) 12/06/2016   Chronic headache disorder 12/06/2016   Hypothyroidism 06/14/2016   Anemia 04/26/2012   Hypertensive disorder 04/26/2012   Gastroesophageal reflux disease 04/26/2012   Obstructive sleep apnea syndrome 04/26/2012  Immunization History  Administered Date(s) Administered   Fluad Quad(high Dose 65+) 05/05/2020, 01/15/2021   Influenza,inj,Quad PF,6+ Mos 01/29/2018, 03/04/2019   PFIZER(Purple Top)SARS-COV-2 Vaccination 05/21/2019, 06/10/2019, 07/03/2020   Pneumococcal Polysaccharide-23 06/20/2018   Tdap 12/09/2018   Zoster Recombinat (Shingrix) 03/06/2019    Conditions to be addressed/monitored:  Hypertension, Hyperlipidemia, Diabetes, Hypothyroidism, and Depression, Dementia  Care Plan : [place]  Updates made by Charlton Haws, Trainer since 10/06/2021 12:00 AM     Problem: Hypertension, Hyperlipidemia, Diabetes, Hypothyroidism, and Depression,  Dementia   Priority: High     Long-Range Goal: Disease mgmt   Start Date: 04/11/2021  Expected End Date: 04/11/2022  Recent Progress: On track  Priority: High  Note:   Current Barriers:  None identified  Pharmacist Clinical Goal(s):  Patient will contact provider office for questions/concerns as evidenced notation of same in electronic health record through collaboration with PharmD and provider.   Interventions: 1:1 collaboration with Pleas Koch, NP regarding development and update of comprehensive plan of care as evidenced by provider attestation and co-signature Inter-disciplinary care team collaboration (see longitudinal plan of care) Comprehensive medication review performed; medication list updated in electronic medical record  Hypertension (BP goal <140/90) -Controlled - BP at goal in recent clinic visits; Denies hypotensive/hypertensive symptoms -Current treatment: Propranolol 80 mg daily (headaches) - Appropriate, Effective, Safe, Accessible -Educated on BP goals and benefits of medications for prevention of heart attack, stroke and kidney damage; Daily salt intake goal < 2300 mg; Importance of home blood pressure monitoring; -Counseled to monitor BP at home 2-3x weekly -Recommended to continue current medication  Hyperlipidemia: (LDL goal < 100) -Controlled - LDL 79 (06/2021) at goal;  pt endorses compliance with statin; she takes full dose aspirin due to hx of TIA; she denies bleeding issues -Current treatment: Rosuvastatin 10 mg daily AM - Appropriate, Effective, Safe, Accessible Aspirin EC 325 mg daily - Appropriate, Effective, Safe, Accessible -Educated on Cholesterol goals; Benefits of statin for ASCVD risk reduction; -Recommended to continue current medication  Diabetes (A1c goal <7%) -Diet-controlled  - A1c is at goal in pre-diabetic range (6.4% - 06/2021) -Medications previously tried: metformin  -Educated on A1c and blood sugar goals; -Counseled to  check feet daily and get yearly eye exams -Counseled on diet and exercise extensively  Pain (Goal: manage symptoms) -Controlled - per pt report -Current treatment  Duloxetine 30 mg daily -Appropriate, Effective, Safe, Accessible Meloxicam 15 mg daily PRN -Appropriate, Effective, Safe, Accessible Gabapentin 800 mg TID - 800 AM, 1600 mg PM -Appropriate, Effective, Safe, Accessible -Recommended to continue current medication  Depression/Anxiety/Insomnia (Goal: manage symptoms) -Controlled - pt reports she sleeps 5-6 hours per night usually and feels relatively rested -Current treatment: Duloxetine 30 mg daily -Appropriate, Effective, Safe, Accessible Trazodone 100 mg - 2 tab HS prn -Appropriate, Effective, Safe, Accessible -Educated on Benefits of medication for symptom control -Recommended to continue current medication  Dementia (Goal: slow progression) -Controlled - pt endorses compliance; -Current treatment  Donepezil 10 mg daily HS -Appropriate, Effective, Safe, Accessible -Counseled on role of medication to slow progression of disease -Recommended to continue current medication  Headaches (Goal: reduce frequency) -Controlled - pt reports significant improvement in migraine frequency since starting Botox injections -Current treatment  Propranolol 80 mg daily Appropriate, Effective, Safe, Accessible Botox injections q3 months -Appropriate, Effective, Safe, Accessible -Recommended to continue current medication  Hypothyroidism (Goal: maintain TSH in goal range) -Controlled - TSH at goal, compliance with 1/2 dose/week has improved since levothyroxine was added  to pill box -Current treatment  Levothyroxine 112 mcg daily except 1/2 tab once a week - Appropriate, Effective, Safe, Accessible -Counseled on consequences of elevated thyroid hormone, which can include worsening headache -Recommend to continue current medication  Health Maintenance -Vaccine gaps: Covid booster,  Prevnar, Shingrix -Hx breast cancer, melanoma, gout. Active ovarian cancer - chemo stopped 10/2020 due to intolerability. Under surveillance with oncology and GYN oncology.  Patient Goals/Self-Care Activities Patient will:  - take medications as prescribed as evidenced by patient report and record review focus on medication adherence by pill box check blood pressure 2-3x weekly, document, and provide at future appointments       Medication Assistance: None required.  Patient affirms current coverage meets needs.  Compliance/Adherence/Medication fill history: Care Gaps: None  Star-Rating Drugs: Rosuvastatin - PDC 96%; lf 06/24/21 x 90 ds  Medication Access: Within the past 30 days, how often has patient missed a dose of medication? 0 Is a pillbox or other method used to improve adherence? Yes  Factors that may affect medication adherence? no barriers identified Are meds synced by current pharmacy? No  Are meds delivered by current pharmacy? Yes  Does patient experience delays in picking up medications due to transportation concerns? No   [facility] Services Reviewed: Is patient disadvantaged to use [facility]?: Yes  Current Rx insurance plan: [employer] [place] Name and location of Current pharmacy:  [facility] [place] [address] Phone: [phone] Fax: [phone]  [facility] services reviewed with patient today?: No  Patient requests to transfer care to [facility]?: No  Reason patient declined to change pharmacies: Disadvantaged due to insurance/mail order   Care Plan and Follow Up Patient Decision:  Patient agrees to Care Plan and Follow-up.  Plan: Telephone follow up appointment with care management team member scheduled for:  1 year  Charlene Brooke, PharmD, [facility] Clinical Pharmacist [facility] at [facility] [phone]

## 2021-10-11 ENCOUNTER — Telehealth: Payer: Self-pay

## 2021-10-11 NOTE — Telephone Encounter (Signed)
Paperwork completed and placed in box for pick-up

## 2021-10-11 NOTE — Telephone Encounter (Signed)
We received a fax from FMLA source for patient, Shannon Higgins, DOB 11/29/1934 for caregiver, Kimberly Briggs.  Ms Briggs will need to go with her mother to appts, be able to take off to give her care, etc starting on 10/15/21 for life for [name].  This will be an intermittent leave of absence, as needed.  Paperwork placed in Dr Cody's inbox for completion and signing.  When paperwork is complete, mail out to Ms Briggs to address on system. 

## 2021-10-12 DIAGNOSIS — Z0279 Encounter for issue of other medical certificate: Secondary | ICD-10-CM

## 2021-10-12 NOTE — Telephone Encounter (Signed)
FMLA paperwork faxed to [phone].  Copies made for billing, scanning, myself, and patient's caretaker, Sheryle Hail.  Copy for Sheryle Hail was put in mail.

## 2021-10-15 ENCOUNTER — Ambulatory Visit: Payer: Medicare HMO | Admitting: Neurology

## 2021-10-15 ENCOUNTER — Ambulatory Visit (INDEPENDENT_AMBULATORY_CARE_PROVIDER_SITE_OTHER): Payer: Medicare HMO | Admitting: Neurology

## 2021-10-15 DIAGNOSIS — G43709 Chronic migraine without aura, not intractable, without status migrainosus: Secondary | ICD-10-CM | POA: Diagnosis not present

## 2021-10-15 MED ORDER — ONABOTULINUMTOXINA 100 UNITS IJ SOLR
200.0000 [IU] | Freq: Once | INTRAMUSCULAR | Status: AC
  Administered 2021-10-15: 155 [IU] via INTRAMUSCULAR

## 2021-10-15 NOTE — Progress Notes (Signed)
[facility]  ° °Procedure Note Botox ° °Attending: Dr. Ioma Chismar ° °Preoperative Diagnosis(es): Chronic migraine ° °Consent obtained from: The patient °Benefits discussed included, but were not limited to decreased muscle tightness, increased joint range of motion, and decreased pain.  Risk discussed included, but were not limited pain and discomfort, bleeding, bruising, excessive weakness, venous thrombosis, muscle atrophy and dysphagia.  Anticipated outcomes of the procedure as well as he risks and benefits of the alternatives to the procedure, and the roles and tasks of the personnel to be involved, were discussed with the patient, and the patient consents to the procedure and agrees to proceed. A copy of the patient medication guide was given to the patient which explains the blackbox warning. ° °Patients identity and treatment sites confirmed Yes.  . ° °Details of Procedure: °Skin was cleaned with alcohol. Prior to injection, the needle plunger was aspirated to make sure the needle was not within a blood vessel.  There was no blood retrieved on aspiration.   ° °Following is a summary of the muscles injected  And the amount of Botulinum toxin used: ° °Dilution °200 units of Botox was reconstituted with 4 ml of preservative free normal saline. °Time of reconstitution: At the time of the office visit (<30 minutes prior to injection)  ° °Injections  °155 total units of Botox was injected with a 30 gauge needle. ° °Injection Sites: °L occipitalis: 15 units- 3 sites  °R occiptalis: 15 units- 3 sites ° °L upper trapezius: 15 units- 3 sites °R upper trapezius: 15 units- 3 sits          °L paraspinal: 10 units- 2 sites °R paraspinal: 10 units- 2 sites ° °Face °L frontalis(2 injection sites):10 units   °R frontalis(2 injection sites):10 units         °L corrugator: 5 units   °R corrugator: 5 units           °Procerus: 5 units   °L temporalis: 20 units °R temporalis: 20 units  ° °Agent:  °200 units of botulinum Type  A (Onobotulinum Toxin type A) was reconstituted with 4 ml of preservative free normal saline.  °Time of reconstitution: At the time of the office visit (<30 minutes prior to injection)  ° ° ° Total injected (Units):  155 ° Total wasted (Units):  45 ° °Patient tolerated procedure well without complications.   °Reinjection is anticipated in 3 months. ° ° °

## 2021-10-21 DIAGNOSIS — H401133 Primary open-angle glaucoma, bilateral, severe stage: Secondary | ICD-10-CM | POA: Diagnosis not present

## 2021-11-03 DIAGNOSIS — R350 Frequency of micturition: Secondary | ICD-10-CM | POA: Diagnosis not present

## 2021-11-03 DIAGNOSIS — R051 Acute cough: Secondary | ICD-10-CM | POA: Diagnosis not present

## 2021-11-03 DIAGNOSIS — N390 Urinary tract infection, site not specified: Secondary | ICD-10-CM | POA: Diagnosis not present

## 2021-11-06 ENCOUNTER — Ambulatory Visit
Admission: RE | Admit: 2021-11-06 | Discharge: 2021-11-06 | Disposition: A | Discharge status: Home / Self Care | Payer: Medicare HMO | Source: Ambulatory Visit

## 2021-11-06 ENCOUNTER — Telehealth: Payer: Self-pay | Admitting: Emergency Medicine

## 2021-11-06 VITALS — BP 155/68 | HR 65 | Temp 98.3°F | Resp 18

## 2021-11-06 DIAGNOSIS — U071 COVID-19: Secondary | ICD-10-CM | POA: Diagnosis not present

## 2021-11-06 MED ORDER — MOLNUPIRAVIR EUA 200MG CAPSULE: 4.0000 | ORAL_CAPSULE | Freq: Two times a day (BID) | ORAL | 0 refills | Status: DC

## 2021-11-06 MED ORDER — MOLNUPIRAVIR EUA 200MG CAPSULE: 4.0000 | ORAL_CAPSULE | Freq: Two times a day (BID) | ORAL | 0 refills | Status: AC

## 2021-11-06 NOTE — ED Provider Notes (Signed)
[facility]    CSN: [id number] Arrival date & time: 11/06/21  1355      History   Chief Complaint Chief Complaint  Patient presents with   Cough    COVID positive - Entered by patient   Appointment    1400    HPI Shannon Higgins is a 87 y.o. female.   The history is provided by the patient and a relative.    The patient presents with her daughter for complaints of cough and nasal congestion that is been present for the past 4 to 5 days.  Patient's daughter states her mother was around her on, who tested positive for COVID.  Patient states she went to her doctor 4 days ago and was coughing, but was told by her doctor that it was an upper respiratory infection.  Patient states that she took a home COVID test last evening which was positive.  Patient complains of fatigue cough, nasal congestion, and runny nose.  She denies fever, chills, or GI symptoms.  She has been taking over-the-counter Robitussin and Tylenol for her symptoms. she has taken 2 COVID vaccines.  Past Medical History:  Diagnosis Date   Acute metabolic encephalopathy 19/50/9326   Acute tubular injury of transplanted kidney ([place]) 06/14/2016   AKI (acute kidney injury) ([place]) 06/14/2016   Asthma    Breast cancer ([place]) 2015   left   Bronchitis    CAP (community acquired pneumonia) 04/29/2012   Chronic sinusitis    Closed fracture of medial malleolus 05/11/2018   Confusion 12/06/2016   Diabetes mellitus without complication (HCC)    Family history of breast cancer    GERD (gastroesophageal reflux disease)    History of ankle fracture 05/14/2018   Last Assessment & Plan:  With a recent fall. Right medial malleous. Has ortho follow up soon, splinted now and tylenol helps her pain   History of recurrent UTIs    Hypertension    Hypokalemia 04/26/2012   Hyponatremia    Hypothyroidism    Imbalance 09/02/2019   Insomnia    Migraine    Neuropathic pain    Ovarian cancer ([place]) 2022   Ovarian mass, left  07/14/2020   Personal history of breast cancer    Personal history of chemotherapy current   bilateral ovarian ca   Pneumonia    Rheumatoid arthritis ([place])    Sepsis ([place]) 04/26/2012   Sepsis secondary to UTI ([place]) 04/26/2012   Sleep apnea    Stroke [facility])    seen in CT scan   Thyroid disease    Transient hypotension 06/14/2016    Patient Active Problem List   Diagnosis Date Noted   COVID-19 virus infection 12/17/2020   Immunocompromised ([place]) 12/17/2020   Personal history of breast cancer    Family history of breast cancer    High grade ovarian cancer ([place]) 07/21/2020   S/P bilateral oophorectomy 07/21/2020   Preventative health care 03/04/2019   Insomnia 09/27/2018   History of TIA (transient ischemic attack) 09/13/2018   Recurrent UTI 06/20/2018   Frequent falls 05/14/2018   Gout 05/14/2018   Type 2 diabetes mellitus with peripheral neuropathy ([place]) 05/11/2018   Type 2 diabetes mellitus ([place]) 05/11/2018   Orthostatic hypotension 05/08/2018   History of breast cancer 10/17/2017   Major depressive disorder 06/21/2017   Chronic low back pain 01/13/2017   Hyperlipidemia 12/06/2016   Glaucoma 12/06/2016   Neuropathic pain 12/06/2016   Malignant melanoma of skin ([place]) 12/06/2016   Dementia arising  in the senium and presenium ([place]) 12/06/2016   Chronic headache disorder 12/06/2016   Hypothyroidism 06/14/2016   Anemia 04/26/2012   Hypertensive disorder 04/26/2012   Gastroesophageal reflux disease 04/26/2012   Obstructive sleep apnea syndrome 04/26/2012    Past Surgical History:  Procedure Laterality Date   ABDOMINAL HYSTERECTOMY     BACK SURGERY     BREAST BIOPSY Right ?`   benign   BREAST LUMPECTOMY Left 2015   breast ca   CHOLECYSTECTOMY     JOINT REPLACEMENT     LAPAROTOMY N/A 07/14/2020   Procedure: LAPAROTOMY;  Surgeon: Gae Dry, MD;  Location: [facility];  Service: Gynecology;  Laterality: N/A;   OOPHORECTOMY     OVARIAN CYST REMOVAL     PORTA CATH  INSERTION N/A 07/27/2020   Procedure: PORTA CATH INSERTION;  Surgeon: Algernon Huxley, MD;  Location: [facility];  Service: Cardiovascular;  Laterality: N/A;   PORTA CATH REMOVAL N/A 04/05/2021   Procedure: PORTA CATH REMOVAL;  Surgeon: Algernon Huxley, MD;  Location: [facility];  Service: Cardiovascular;  Laterality: N/A;   REPLACEMENT TOTAL KNEE Right    SALPINGOOPHORECTOMY Bilateral 07/14/2020   Procedure: OPEN SALPINGO OOPHORECTOMY;  Surgeon: Gae Dry, MD;  Location: [facility];  Service: Gynecology;  Laterality: Bilateral;   TOTAL SHOULDER REPLACEMENT Left     OB History     Gravida  2   Para  2   Term  2   Preterm      AB      Living  2      SAB      IAB      Ectopic      Multiple      Live Births               Home Medications    Prior to Admission medications   Medication Sig Start Date End Date Taking? Authorizing Provider  molnupiravir EUA (LAGEVRIO) 200 mg CAPS capsule Take 4 capsules (800 mg total) by mouth 2 (two) times daily for 5 days. 11/06/21 11/11/21 Yes Aubra Pappalardo-Warren, Alda Lea, NP  Accu-Chek FastClix Lancets MISC USE AS INSTRUCTED TO TEST BLOOD SUGAR DAILY 10/14/20   Pleas Koch, NP  ACCU-CHEK GUIDE test strip USE AS INSTRUCTED TO TEST BLOOD SUGAR DAILY 10/14/20   Pleas Koch, NP  aspirin EC 325 MG tablet Take 325 mg by mouth daily.    [provider]  blood glucose meter kit and supplies Dispense based on patient and insurance preference. Use up to 2 times daily as directed. (FOR ICD-10 E10.9, E11.9). 03/04/19   Pleas Koch, NP  Botulinum Toxin Type A (BOTOX) 200 units SOLR Inject 155 units IM into multiple sites of face head and neck every 90 days 03/18/21   Pieter Partridge, DO  cephALEXin (KEFLEX) 500 MG capsule Take 500 mg by mouth every 12 (twelve) hours. 11/03/21   [provider]  Cranberry 1000 MG CAPS Take 1 capsule by mouth 2 (two) times daily.    [provider]  donepezil  (ARICEPT) 10 MG tablet Take 1 tablet (10 mg total) by mouth at bedtime. 03/02/21   Cameron Sprang, MD  dorzolamide-timolol (COSOPT) 22.3-6.8 MG/ML ophthalmic solution Place 1 drop into both eyes 2 (two) times daily.    [provider]  DULoxetine (CYMBALTA) 30 MG capsule Take 1 capsule (30 mg total) by mouth daily. For depression and pain. 01/15/21   Alma Friendly  K, NP  fluticasone (FLONASE) 50 MCG/ACT nasal spray USE 2 SPRAYS IN EACH NOSTRIL DAILY AS NEEDED FOR ALLERGIES OR RHINITIS 07/24/21   Pleas Koch, NP  gabapentin (NEURONTIN) 800 MG tablet TAKE 1 TABLET BY MOUTH 3 TIMES A DAY FOR NEUROPATHY 04/12/21   Pleas Koch, NP  latanoprost (XALATAN) 0.005 % ophthalmic solution Place 1 drop into both eyes at bedtime.    [provider]  levothyroxine (SYNTHROID) 112 MCG tablet TAKE 1 TABLET EVERY MORNING MONDAY THROUGH SATURDAY AND 1/2 TABLET ON SUNDAY. TAKE ON AN EMPTY STOMACH WITH WATER ONLY 10/03/21   Pleas Koch, NP  meclizine (ANTIVERT) 25 MG tablet TAKE 1 TABLET (25 MG TOTAL) BY MOUTH 2 (TWO) TIMES DAILY AS NEEDED FOR DIZZINESS. 05/14/20   Pleas Koch, NP  meloxicam (MOBIC) 15 MG tablet TAKE 1 TABLET DAILY AS NEEDED FOR PAIN 07/19/21   Pleas Koch, NP  Netarsudil Dimesylate (RHOPRESSA) 0.02 % SOLN Place 1 drop into both eyes daily at 6 (six) AM. 12/04/18   [provider]  omeprazole (PRILOSEC) 20 MG capsule TAKE 1 CAPSULE TWICE DAILY FOR HEARTBURN. 07/19/21   Pleas Koch, NP  pilocarpine (PILOCAR) 2 % ophthalmic solution Place 1 drop into both eyes 4 (four) times daily.  02/19/19   [provider]  propranolol (INDERAL) 80 MG tablet Take 1 tablet (80 mg total) by mouth daily. For headache prevention. 12/03/20   Pleas Koch, NP  rosuvastatin (CRESTOR) 10 MG tablet TAKE 1 TABLET DAILY FOR CHOLESTEROL. 12/03/20   Pleas Koch, NP  SUMAtriptan (IMITREX) 25 MG tablet Take 1 tablet (25 mg total) by mouth every 2 (two) hours  as needed for migraine. May repeat in 2 hours if headache persists or recurs. 04/20/21   Cameron Sprang, MD  traZODone (DESYREL) 100 MG tablet TAKE 2 TABLETS AT BEDTIME AS NEEDED FOR SLEEP 07/19/21   Pleas Koch, NP    Family History Family History  Problem Relation Age of Onset   Diabetes Daughter    Hypertension Daughter    Fibromyalgia Daughter    GER disease Daughter    Fibromyalgia Daughter    Crohn's disease Daughter    Asthma Daughter    Hypertension Mother    Breast cancer Other    Breast cancer Niece    Uterine cancer Niece     Social History Social History   Tobacco Use   Smoking status: Never   Smokeless tobacco: Never  Vaping Use   Vaping Use: Never used  Substance Use Topics   Alcohol use: No   Drug use: No     Allergies   Patient has no known allergies.   Review of Systems Review of Systems Per HPI  Physical Exam Triage Vital Signs ED Triage Vitals  Enc Vitals Group     BP 11/06/21 1409 (!) 155/68     Pulse Rate 11/06/21 1409 65     Resp 11/06/21 1409 18     Temp 11/06/21 1409 98.3 F (36.8 C)     Temp Source 11/06/21 1409 Temporal     SpO2 11/06/21 1409 93 %     Weight --      Height --      Head Circumference --      Peak Flow --      Pain Score 11/06/21 1410 0     Pain Loc --      Pain Edu? --      Excl. in  GC? --    No data found.  Updated Vital Signs BP (!) 155/68 (BP Location: Right Arm)   Pulse 65   Temp 98.3 F (36.8 C) (Temporal)   Resp 18   SpO2 93%   Visual Acuity Right Eye Distance:   Left Eye Distance:   Bilateral Distance:    Right Eye Near:   Left Eye Near:    Bilateral Near:     Physical Exam Vitals and nursing note reviewed.  Constitutional:      General: She is not in acute distress.    Appearance: Normal appearance. She is well-developed.  HENT:     Head: Normocephalic.     Right Ear: Tympanic membrane, ear canal and external ear normal.     Left Ear: Tympanic membrane, ear canal and  external ear normal.     Nose: Congestion present.     Mouth/Throat:     Mouth: Mucous membranes are moist.     Pharynx: No posterior oropharyngeal erythema.  Eyes:     Extraocular Movements: Extraocular movements intact.     Conjunctiva/sclera: Conjunctivae normal.     Pupils: Pupils are equal, round, and reactive to light.  Cardiovascular:     Rate and Rhythm: Normal rate and regular rhythm.     Pulses: Normal pulses.     Heart sounds: Normal heart sounds.  Pulmonary:     Effort: Pulmonary effort is normal.     Breath sounds: Normal breath sounds.  Abdominal:     General: Bowel sounds are normal. There is no distension.     Palpations: Abdomen is soft.     Tenderness: There is no abdominal tenderness. There is no guarding or rebound.  Genitourinary:    Vagina: Normal. No vaginal discharge.  Musculoskeletal:     Cervical back: Normal range of motion.  Lymphadenopathy:     Cervical: No cervical adenopathy.  Skin:    General: Skin is warm and dry.     Findings: No erythema or rash.  Neurological:     General: No focal deficit present.     Mental Status: She is alert and oriented to person, place, and time.     Cranial Nerves: No cranial nerve deficit.  Psychiatric:        Mood and Affect: Mood normal.        Behavior: Behavior normal.      UC Treatments / Results  Labs (all labs ordered are listed, but only abnormal results are displayed) Labs Reviewed - No data to display  EKG   Radiology No results found.  Procedures Procedures (including critical care time)  Medications Ordered in UC Medications - No data to display  Initial Impression / Assessment and Plan / UC Course  I have reviewed the triage vital signs and the nursing notes.  Pertinent labs & imaging results that were available during my care of the patient were reviewed by me and considered in my medical decision making (see chart for details).  Patient presents after testing positive for COVID  last evening.  She complains of fatigue, nasal congestion, and cough.  Patient's vital signs are stable, she is in no acute distress.  We will start patient on molnupiravir as we are still within the 5-day window of her symptoms developing.  Supportive care recommendations were provided to the patient and her daughter.  Patient was advised to continue the Robitussin that she is currently taking for her cough.  Strict indication of when to go to the   emergency department were provided. Final Clinical Impressions(s) / UC Diagnoses   Final diagnoses:  COVID-19     Discharge Instructions      Take medication as prescribed. Increase fluids and allow for plenty of rest. May take over-the-counter Tylenol as needed for pain, fever, or general discomfort. Remain isolated until you have completed the antiviral.  If you continue to experience symptoms, continue to wear your mask. Go to the hospital immediately if you develop worsening cough, shortness of breath, difficulty breathing, wheezing, or other concerns.     ED Prescriptions     Medication Sig Dispense Auth. Provider   molnupiravir EUA (LAGEVRIO) 200 mg CAPS capsule Take 4 capsules (800 mg total) by mouth 2 (two) times daily for 5 days. 40 capsule Leath-Warren,  J, NP      PDMP not reviewed this encounter.   Leath-Warren,  J, NP 11/06/21 1440  

## 2021-11-06 NOTE — ED Triage Notes (Signed)
Pt reports cough and nasal congestion x 4-5 days. Robitussin gives some relief. Pt is taking cephalexin for UTI.   Pt reports positive COVID test at home last night.   Per family, pt can not take Paxlovid as she is taking Trazodone. Pt family said she can take [place].

## 2021-11-06 NOTE — Discharge Instructions (Signed)
Take medication as prescribed. Increase fluids and allow for plenty of rest. May take over-the-counter Tylenol as needed for pain, fever, or general discomfort. Remain isolated until you have completed the antiviral.  If you continue to experience symptoms, continue to wear your mask. Go to the hospital immediately if you develop worsening cough, shortness of breath, difficulty breathing, wheezing, or other concerns.

## 2021-11-06 NOTE — Telephone Encounter (Signed)
[facility] [place] [place] [facility].

## 2021-11-08 ENCOUNTER — Telehealth: Payer: Self-pay

## 2021-11-08 NOTE — Telephone Encounter (Signed)
Pre chart review tab pt was seen [facility] on 11/06/21. Sending note to Gentry Fitz NP and [facility] CMA.

## 2021-11-08 NOTE — Telephone Encounter (Signed)
[place] Night - Client TELEPHONE ADVICE RECORD [facility] Patient Name: Shannon Higgins Gender: Female DOB: 05/24/1934 Age: 87 Y 77 M 11 D Return Phone Number: [phone] (Primary), [phone] (Secondary), [phone] (Alternate) Address: City/ State/ Zip: [place] [address] Client [facility] Night - Client Client Site [place] - Night Provider Alma Friendly - NP Contact Type Call Who Is Calling Patient / Member / Family / Caregiver Call Type Triage / Clinical Caller Name [name] Relationship To Patient Daughter Return Phone Number [phone] (Primary) Chief Complaint Cough Reason for Call Symptomatic / Request for Health Information Initial Comment Caller states mother tested pos for [place]; wants paxlovid called in; currently out of town; cough, fever, on abx for UTI; Terminous Not Listed [facility] Translation No Nurse Assessment Nurse: Eugenio Hoes, RN, Sarah Date/Time (Eastern Time): 11/06/2021 9:56:15 AM Confirm and document reason for call. If symptomatic, describe symptoms. ---Caller states her mom tested positive yesterday. She reports cough that started on Monday. Does the patient have any new or worsening symptoms? ---Yes Will a triage be completed? ---Yes Related visit to physician within the last 2 weeks? ---Yes Does the PT have any chronic conditions? (i.e. diabetes, asthma, this includes High risk factors for pregnancy, etc.) ---Yes List chronic conditions. ---CVA, hypertension, diabetes, hyperlipidemia Is this a behavioral health or substance abuse call? ---No Guidelines Guideline Title Affirmed Question Affirmed Notes Nurse Date/Time (Eastern Time) COVID-19 - Diagnosed or Suspected MILD difficulty breathing (e.g., minimal/no SOB at rest, SOB with walking, pulse <100) Eugenio Hoes, RN, Judson Roch 11/06/2021 10:00:05 AM PLEASE NOTE: All timestamps contained within  this report are represented as [place] Standard Time. CONFIDENTIALTY NOTICE: This fax transmission is intended only for the addressee. It contains information that is legally privileged, confidential or otherwise protected from use or disclosure. If you are not the intended recipient, you are strictly prohibited from reviewing, disclosing, copying using or disseminating any of this information or taking any action in reliance on or regarding this information. If you have received this fax in error, please notify us immediately by telephone so that we can arrange for its return to [place]. Phone: [phone], Toll-Free: [phone], Fax: [phone] Page: 2 of 2 Call Id: [id number] [place]. Time Eilene Ghazi Time) Disposition Final User 11/06/2021 10:05:20 AM See HCP within 4 Hours (or PCP triage) Yes Eugenio Hoes, RN, Sarah Final Disposition 11/06/2021 10:05:20 AM See HCP within 4 Hours (or PCP triage) Yes Eugenio Hoes, RN, [place] Disagree/Comply Comply Caller Understands Yes PreDisposition Call Doctor Care Advice Given Per Guideline SEE HCP (OR PCP TRIAGE) WITHIN 4 HOURS: * IF OFFICE WILL BE CLOSED AND NO PCP (PRIMARY CARE PROVIDER) SECOND-LEVEL TRIAGE: You need to be seen within the next 3 or 4 hours. A nearby Urgent Care Center [facility]) is often a good source of care. Another choice is to go to the ED. Go sooner if you become worse. GENERAL CARE ADVICE FOR COVID-19 SYMPTOMS: * Feeling dehydrated: Drink extra liquids. If the air in your home is dry, use a humidifier. CALL BACK IF: * You become worse CARE ADVICE given per COVID-19 - DIAGNOSED OR SUSPECTED (Adult) guideline. Referrals GO TO FACILITY OTHER - SPECIF

## 2021-11-09 NOTE — Telephone Encounter (Signed)
Noted. Patient treated for Covid-19

## 2021-11-10 ENCOUNTER — Ambulatory Visit: Payer: Medicare HMO

## 2021-11-20 DIAGNOSIS — R399 Unspecified symptoms and signs involving the genitourinary system: Secondary | ICD-10-CM | POA: Diagnosis not present

## 2021-11-20 DIAGNOSIS — R8290 Unspecified abnormal findings in urine: Secondary | ICD-10-CM | POA: Diagnosis not present

## 2021-11-22 ENCOUNTER — Ambulatory Visit: Payer: Medicare HMO | Admitting: Neurology

## 2021-11-22 ENCOUNTER — Other Ambulatory Visit: Payer: Self-pay

## 2021-11-22 DIAGNOSIS — R8290 Unspecified abnormal findings in urine: Secondary | ICD-10-CM | POA: Diagnosis not present

## 2021-11-30 ENCOUNTER — Telehealth: Payer: Self-pay | Admitting: *Deleted

## 2021-11-30 ENCOUNTER — Other Ambulatory Visit: Payer: Self-pay

## 2021-11-30 DIAGNOSIS — N390 Urinary tract infection, site not specified: Secondary | ICD-10-CM | POA: Diagnosis not present

## 2021-11-30 DIAGNOSIS — R399 Unspecified symptoms and signs involving the genitourinary system: Secondary | ICD-10-CM | POA: Diagnosis not present

## 2021-11-30 NOTE — Telephone Encounter (Signed)
Pt daughter [name] called in to have appt cancelled. Pt is currently out of town and will call to r/s when she is back.

## 2021-12-01 ENCOUNTER — Inpatient Hospital Stay: Payer: Medicare HMO

## 2022-01-12 ENCOUNTER — Telehealth: Payer: Self-pay | Admitting: Primary Care

## 2022-01-12 NOTE — Telephone Encounter (Signed)
Pt called in requesting a referral for Physical therapy .Please advise # [phone]

## 2022-01-12 NOTE — Telephone Encounter (Signed)
Called patient states she would like PT for her leg pain. Advised patient that we would need to see in office to place order. She declined making at this time. She will call back later to set something up.

## 2022-01-14 ENCOUNTER — Ambulatory Visit (INDEPENDENT_AMBULATORY_CARE_PROVIDER_SITE_OTHER): Payer: Medicare PPO | Admitting: Neurology

## 2022-01-14 DIAGNOSIS — G43709 Chronic migraine without aura, not intractable, without status migrainosus: Secondary | ICD-10-CM

## 2022-01-14 MED ORDER — ONABOTULINUMTOXINA 100 UNITS IJ SOLR
200.0000 [IU] | Freq: Once | INTRAMUSCULAR | Status: AC
  Administered 2022-01-14: 155 [IU] via INTRAMUSCULAR

## 2022-01-14 NOTE — Progress Notes (Signed)
[facility]  ° °Procedure Note Botox ° °Attending: Dr. Teria Khachatryan ° °Preoperative Diagnosis(es): Chronic migraine ° °Consent obtained from: The patient °Benefits discussed included, but were not limited to decreased muscle tightness, increased joint range of motion, and decreased pain.  Risk discussed included, but were not limited pain and discomfort, bleeding, bruising, excessive weakness, venous thrombosis, muscle atrophy and dysphagia.  Anticipated outcomes of the procedure as well as he risks and benefits of the alternatives to the procedure, and the roles and tasks of the personnel to be involved, were discussed with the patient, and the patient consents to the procedure and agrees to proceed. A copy of the patient medication guide was given to the patient which explains the blackbox warning. ° °Patients identity and treatment sites confirmed Yes.  . ° °Details of Procedure: °Skin was cleaned with alcohol. Prior to injection, the needle plunger was aspirated to make sure the needle was not within a blood vessel.  There was no blood retrieved on aspiration.   ° °Following is a summary of the muscles injected  And the amount of Botulinum toxin used: ° °Dilution °200 units of Botox was reconstituted with 4 ml of preservative free normal saline. °Time of reconstitution: At the time of the office visit (<30 minutes prior to injection)  ° °Injections  °155 total units of Botox was injected with a 30 gauge needle. ° °Injection Sites: °L occipitalis: 15 units- 3 sites  °R occiptalis: 15 units- 3 sites ° °L upper trapezius: 15 units- 3 sites °R upper trapezius: 15 units- 3 sits          °L paraspinal: 10 units- 2 sites °R paraspinal: 10 units- 2 sites ° °Face °L frontalis(2 injection sites):10 units   °R frontalis(2 injection sites):10 units         °L corrugator: 5 units   °R corrugator: 5 units           °Procerus: 5 units   °L temporalis: 20 units °R temporalis: 20 units  ° °Agent:  °200 units of botulinum Type  A (Onobotulinum Toxin type A) was reconstituted with 4 ml of preservative free normal saline.  °Time of reconstitution: At the time of the office visit (<30 minutes prior to injection)  ° ° ° Total injected (Units):  155 ° Total wasted (Units):  45 ° °Patient tolerated procedure well without complications.   °Reinjection is anticipated in 3 months. ° ° °

## 2022-01-24 ENCOUNTER — Other Ambulatory Visit: Payer: Self-pay | Admitting: Primary Care

## 2022-01-24 DIAGNOSIS — Z1231 Encounter for screening mammogram for malignant neoplasm of breast: Secondary | ICD-10-CM

## 2022-01-25 ENCOUNTER — Telehealth: Payer: Self-pay

## 2022-01-25 NOTE — Telephone Encounter (Signed)
Patient daughter called to get her mother reschedule for missed GYN visit. Call back number [phone]

## 2022-01-27 ENCOUNTER — Encounter: Payer: Self-pay | Admitting: Family Medicine

## 2022-01-27 ENCOUNTER — Ambulatory Visit (INDEPENDENT_AMBULATORY_CARE_PROVIDER_SITE_OTHER): Payer: Medicare PPO | Admitting: Family Medicine

## 2022-01-27 VITALS — BP 140/90 | HR 87 | Temp 99.4°F | Ht 64.0 in | Wt 170.0 lb

## 2022-01-27 DIAGNOSIS — Z23 Encounter for immunization: Secondary | ICD-10-CM

## 2022-01-27 DIAGNOSIS — F331 Major depressive disorder, recurrent, moderate: Secondary | ICD-10-CM | POA: Diagnosis not present

## 2022-01-27 DIAGNOSIS — R8290 Unspecified abnormal findings in urine: Secondary | ICD-10-CM | POA: Insufficient documentation

## 2022-01-27 DIAGNOSIS — E785 Hyperlipidemia, unspecified: Secondary | ICD-10-CM

## 2022-01-27 DIAGNOSIS — R5383 Other fatigue: Secondary | ICD-10-CM | POA: Diagnosis not present

## 2022-01-27 DIAGNOSIS — N390 Urinary tract infection, site not specified: Secondary | ICD-10-CM | POA: Diagnosis not present

## 2022-01-27 DIAGNOSIS — R2689 Other abnormalities of gait and mobility: Secondary | ICD-10-CM

## 2022-01-27 DIAGNOSIS — R531 Weakness: Secondary | ICD-10-CM | POA: Insufficient documentation

## 2022-01-27 LAB — POC URINALSYSI DIPSTICK (AUTOMATED)
Bilirubin, UA: NEGATIVE
Blood, UA: NEGATIVE
Glucose, UA: NEGATIVE
Ketones, UA: 5
Leukocytes, UA: NEGATIVE
Nitrite, UA: NEGATIVE
Protein, UA: POSITIVE — AB
Spec Grav, UA: 1.02 (ref 1.010–1.025)
Urobilinogen, UA: 0.2 E.U./dL
pH, UA: 5.5 (ref 5.0–8.0)

## 2022-01-27 MED ORDER — ROSUVASTATIN CALCIUM 10 MG PO TABS: 10.0000 mg | ORAL_TABLET | Freq: Every day | ORAL | 0 refills | Status: DC

## 2022-01-27 MED ORDER — DULOXETINE HCL 60 MG PO CPEP: 60.0000 mg | ORAL_CAPSULE | Freq: Every day | ORAL | 3 refills | Status: DC

## 2022-01-27 NOTE — Progress Notes (Signed)
Patient ID: Shannon Higgins, female    DOB: 12-Dec-1934, 87 y.o.   MRN: [id number]  This visit was conducted in person.  Ht 5' 4"  (1.626 m)   BMI 28.87 kg/m    CC:  Chief Complaint  Patient presents with   Fatigue   Urine odor   Depression    Cymbalta was decreased from 60 mg to 30 mg   Trouble Walking    Wants PT    Subjective:   HPI: Shannon Higgins is a 87 y.o. female patient of Shannon Higgins with history of major depressive disorder, chronic low back pain, frequent falls, dementia, ovarian cancer, breast cancer, stroke, hypothyroidism, recurrent UTI and hypertension presenting on 01/27/2022 for Fatigue, Urine odor, and Depression (Cymbalta was decreased from 60 mg to 30 mg)  She presents to office today with her daughter. Her daughter notes that since her Cymbalta was decreased from 60 mg to 30 mg she seems to be more tired and depressed seeming.  Had decreased given was feeling numb.  She was doing better in social situation of living with sister over the summer.   PHQ9 today:  She has been move tired over the last month.  NO associated dyspnea with exertion, no new CP. She seems to be weak overall and feels she would improve with physical therapy.  She currently uses a walker to get around.  She has decreased balance with her peripheral neuropathy.  No falls.   May not have been taking meds correctly when  With sisters over the summer. BAck at daughters house who manages her  meds.   No blood loss, no blood in stool. Last Urine culture negative in 5.2023 Last UTI was E. coli April 15, 2021    ECHO reviewed 2022  Relevant past medical, surgical, family and social history reviewed and updated as indicated. Interim medical history since our last visit reviewed. Allergies and medications reviewed and updated. Outpatient Medications Prior to Visit  Medication Sig Dispense Refill   Accu-Chek FastClix Lancets MISC USE AS INSTRUCTED TO TEST BLOOD SUGAR DAILY 102  each 1   ACCU-CHEK GUIDE test strip USE AS INSTRUCTED TO TEST BLOOD SUGAR DAILY 100 strip 1   aspirin EC 325 MG tablet Take 325 mg by mouth daily.     blood glucose meter kit and supplies Dispense based on patient and insurance preference. Use up to 2 times daily as directed. (FOR ICD-10 E10.9, E11.9). 1 each 0   Botulinum Toxin Type A (BOTOX) 200 units SOLR Inject 155 units IM into multiple sites of face head and neck every 90 days 1 each 4   Cranberry 1000 MG CAPS Take 1 capsule by mouth 2 (two) times daily.     donepezil (ARICEPT) 10 MG tablet Take 1 tablet (10 mg total) by mouth at bedtime. 90 tablet 3   dorzolamide-timolol (COSOPT) 22.3-6.8 MG/ML ophthalmic solution Place 1 drop into both eyes 2 (two) times daily.     DULoxetine (CYMBALTA) 30 MG capsule Take 1 capsule (30 mg total) by mouth daily. For depression and pain. 90 capsule 3   fluticasone (FLONASE) 50 MCG/ACT nasal spray USE 2 SPRAYS IN EACH NOSTRIL DAILY AS NEEDED FOR ALLERGIES OR RHINITIS 48 g 0   gabapentin (NEURONTIN) 800 MG tablet TAKE 1 TABLET BY MOUTH 3 TIMES A DAY FOR NEUROPATHY 270 tablet 2   latanoprost (XALATAN) 0.005 % ophthalmic solution Place 1 drop into both eyes at bedtime.     levothyroxine (SYNTHROID) 112  MCG tablet TAKE 1 TABLET EVERY MORNING MONDAY THROUGH SATURDAY AND 1/2 TABLET ON SUNDAY. TAKE ON AN EMPTY STOMACH WITH WATER ONLY 78 tablet 2   meclizine (ANTIVERT) 25 MG tablet TAKE 1 TABLET (25 MG TOTAL) BY MOUTH 2 (TWO) TIMES DAILY AS NEEDED FOR DIZZINESS. 180 tablet 0   meloxicam (MOBIC) 15 MG tablet TAKE 1 TABLET DAILY AS NEEDED FOR PAIN 90 tablet 0   Netarsudil Dimesylate (RHOPRESSA) 0.02 % SOLN Place 1 drop into both eyes daily at 6 (six) AM.     omeprazole (PRILOSEC) 20 MG capsule TAKE 1 CAPSULE TWICE DAILY FOR HEARTBURN. 180 capsule 1   pilocarpine (PILOCAR) 2 % ophthalmic solution Place 1 drop into both eyes 4 (four) times daily.      propranolol (INDERAL) 80 MG tablet Take 1 tablet (80 mg total) by mouth  daily. For headache prevention. 90 tablet 1   rosuvastatin (CRESTOR) 10 MG tablet TAKE 1 TABLET DAILY FOR CHOLESTEROL. 90 tablet 1   SUMAtriptan (IMITREX) 25 MG tablet Take 1 tablet (25 mg total) by mouth every 2 (two) hours as needed for migraine. May repeat in 2 hours if headache persists or recurs. 10 tablet 5   traZODone (DESYREL) 100 MG tablet TAKE 2 TABLETS AT BEDTIME AS NEEDED FOR SLEEP 180 tablet 1   cephALEXin (KEFLEX) 500 MG capsule Take 500 mg by mouth every 12 (twelve) hours.     No facility-administered medications prior to visit.     Per HPI unless specifically indicated in ROS section below Review of Systems  Constitutional:  Positive for fatigue. Negative for fever.  HENT:  Negative for congestion.   Eyes:  Negative for pain.  Respiratory:  Negative for cough and shortness of breath.   Cardiovascular:  Negative for chest pain, palpitations and leg swelling.  Gastrointestinal:  Negative for abdominal pain.  Genitourinary:  Negative for dysuria and vaginal bleeding.  Musculoskeletal:  Negative for back pain.  Neurological:  Positive for weakness. Negative for syncope, light-headedness and headaches.  Psychiatric/Behavioral:  Negative for dysphoric mood.    Objective:  Ht 5' 4"  (1.626 m)   BMI 28.87 kg/m   Wt Readings from Last 3 Encounters:  09/20/21 168 lb 3.2 oz (76.3 kg)  07/28/21 170 lb (77.1 kg)  06/23/21 165 lb (74.8 kg)      Physical Exam Constitutional:      General: She is not in acute distress.    Appearance: Normal appearance. She is well-developed. She is not ill-appearing or toxic-appearing.     Comments: Ambulating slowly with a walker  HENT:     Head: Normocephalic.     Right Ear: Hearing, tympanic membrane, ear canal and external ear normal. Tympanic membrane is not erythematous, retracted or bulging.     Left Ear: Hearing, tympanic membrane, ear canal and external ear normal. Tympanic membrane is not erythematous, retracted or bulging.     Nose:  No mucosal edema or rhinorrhea.     Right Sinus: No maxillary sinus tenderness or frontal sinus tenderness.     Left Sinus: No maxillary sinus tenderness or frontal sinus tenderness.     Mouth/Throat:     Pharynx: Uvula midline.  Eyes:     General: Lids are normal. Lids are everted, no foreign bodies appreciated.     Conjunctiva/sclera: Conjunctivae normal.     Pupils: Pupils are equal, round, and reactive to light.  Neck:     Thyroid: No thyroid mass or thyromegaly.     Vascular: No carotid  bruit.     Trachea: Trachea normal.  Cardiovascular:     Rate and Rhythm: Normal rate and regular rhythm.     Pulses: Normal pulses.     Heart sounds: S1 normal and S2 normal. Murmur heard.     Systolic murmur is present with a grade of 2/6.     No friction rub. No gallop.  Pulmonary:     Effort: Pulmonary effort is normal. No tachypnea or respiratory distress.     Breath sounds: Normal breath sounds. No decreased breath sounds, wheezing, rhonchi or rales.  Abdominal:     General: Bowel sounds are normal.     Palpations: Abdomen is soft.     Tenderness: There is no abdominal tenderness.  Musculoskeletal:     Cervical back: Normal range of motion and neck supple.  Skin:    General: Skin is warm and dry.     Findings: No rash.  Neurological:     Mental Status: She is alert.  Psychiatric:        Mood and Affect: Mood is not anxious or depressed.        Speech: Speech normal.        Behavior: Behavior normal. Behavior is cooperative.        Thought Content: Thought content normal.        Judgment: Judgment normal.       Results for orders placed or performed in visit on 09/20/21  Urine Culture   Specimen: Urine, Random  Result Value Ref Range   Specimen Description      URINE, RANDOM Performed at [facility], [address], [place], [place] [address]    Special Requests      NONE Performed at [facility], [address] [place], [place] [address]    Culture (A)      <10,000 COLONIES/mL INSIGNIFICANT GROWTH Performed at [facility] [address], [place], [place] [address]    Report Status 09/21/2021 FINAL      COVID 19 screen:  No recent travel or known exposure to [place] The patient denies respiratory symptoms of COVID 19 at this time. The importance of social distancing was discussed today.   Assessment and Plan    Problem List Items Addressed This Visit     Abnormal urine odor - Primary    No additional symptoms of urinary tract infection.  Urinalysis clear today.  Doubt urinary tract infection as cause of fatigue.      Relevant Orders   POCT Urinalysis Dipstick (Automated) (Completed)   Decreased mobility    Chronic, recent worsening She has a history of difficulty with balance likely secondary to peripheral neuropathy.  She has a history of frequent falls.  She feels she did well with physical therapy in the past and would like to repeat this for strengthening and improvement of mobility and balance. Referral placed for physical therapy via home health given she is essentially homebound and cannot leave without assistance.      Relevant Orders   Ambulatory referral to Home Health   Hyperlipidemia   Relevant Medications   rosuvastatin (CRESTOR) 10 MG tablet   MDD (major depressive disorder), recurrent episode (HCC)    Chronic, recent worsening of motivation and evidence of anhedonia on lower dose of Cymbalta.  Will increase Cymbalta back up to 60 mg daily.  She will follow-up with her PCP for repeat evaluation in 4 weeks      Relevant Medications   DULoxetine (  CYMBALTA) 60 MG capsule   Other fatigue    Acute worsening, daughter associates with change in Cymbalta.  We will return to previous dose. We will also look for additional secondary causes with labs given her history of hypothyroidism and anemia.  We will evaluate vitamin levels, liver and kidney function.      Relevant Orders   Ambulatory referral to [facility]   CBC with Differential/Platelet   TSH   Vitamin B12   VITAMIN D 25 Hydroxy (Vit-D Deficiency, Fractures)   Comprehensive metabolic panel   Recurrent UTI   Other Visit Diagnoses     Need for influenza vaccination       Relevant Orders   Flu Vaccine QUAD High Dose(Fluad) (Completed)      Meds ordered this encounter  Medications   rosuvastatin (CRESTOR) 10 MG tablet    Sig: Take 1 tablet (10 mg total) by mouth daily. for cholesterol    Dispense:  90 tablet    Refill:  0   DULoxetine (CYMBALTA) 60 MG capsule    Sig: Take 1 capsule (60 mg total) by mouth daily.    Dispense:  30 capsule    Refill:  3   Orders Placed This Encounter  Procedures   Flu Vaccine QUAD High Dose(Fluad)   CBC with Differential/Platelet   TSH   Vitamin B12   VITAMIN D 25 Hydroxy (Vit-D Deficiency, Fractures)   Comprehensive metabolic panel   Ambulatory referral to Home Health    Referral Priority:   Routine    Referral Type:   Home Health Care    Referral Reason:   Specialty Services Required    Requested Specialty:   [place]    Number of Visits Requested:   1   POCT Urinalysis Dipstick (Automated)     Eliezer Lofts, MD

## 2022-01-27 NOTE — Assessment & Plan Note (Signed)
Chronic, recent worsening of motivation and evidence of anhedonia on lower dose of Cymbalta.  Will increase Cymbalta back up to 60 mg daily.  She will follow-up with her PCP for repeat evaluation in 4 weeks

## 2022-01-27 NOTE — Assessment & Plan Note (Signed)
Acute worsening, daughter associates with change in Cymbalta.  We will return to previous dose. We will also look for additional secondary causes with labs given her history of hypothyroidism and anemia.  We will evaluate vitamin levels, liver and kidney function.

## 2022-01-27 NOTE — Assessment & Plan Note (Signed)
Chronic, recent worsening She has a history of difficulty with balance likely secondary to peripheral neuropathy.  She has a history of frequent falls.  She feels she did well with physical therapy in the past and would like to repeat this for strengthening and improvement of mobility and balance. Referral placed for physical therapy via home health given she is essentially homebound and cannot leave without assistance.

## 2022-01-27 NOTE — Assessment & Plan Note (Signed)
No additional symptoms of urinary tract infection.  Urinalysis clear today.  Doubt urinary tract infection as cause of fatigue.

## 2022-01-27 NOTE — Patient Instructions (Addendum)
Please stop at the lab to have labs drawn.  Increase  Cymbalta to 60 mg daily.  We will work on referral to  PT for weakness.  Increase water intake.

## 2022-01-28 LAB — CBC WITH DIFFERENTIAL/PLATELET
Basophils Absolute: 0 10*3/uL (ref 0.0–0.1)
Basophils Relative: 0.6 % (ref 0.0–3.0)
Eosinophils Absolute: 0.2 10*3/uL (ref 0.0–0.7)
Eosinophils Relative: 2.2 % (ref 0.0–5.0)
HCT: 37.1 % (ref 36.0–46.0)
Hemoglobin: 12.5 g/dL (ref 12.0–15.0)
Lymphocytes Relative: 21.7 % (ref 12.0–46.0)
Lymphs Abs: 1.6 10*3/uL (ref 0.7–4.0)
MCHC: 33.7 g/dL (ref 30.0–36.0)
MCV: 94.9 fl (ref 78.0–100.0)
Monocytes Absolute: 0.7 10*3/uL (ref 0.1–1.0)
Monocytes Relative: 9 % (ref 3.0–12.0)
Neutro Abs: 5 10*3/uL (ref 1.4–7.7)
Neutrophils Relative %: 66.5 % (ref 43.0–77.0)
Platelets: 250 10*3/uL (ref 150.0–400.0)
RBC: 3.9 Mil/uL (ref 3.87–5.11)
RDW: 14.2 % (ref 11.5–15.5)
WBC: 7.5 10*3/uL (ref 4.0–10.5)

## 2022-01-28 LAB — COMPREHENSIVE METABOLIC PANEL
ALT: 15 U/L (ref 0–35)
AST: 18 U/L (ref 0–37)
Albumin: 4.3 g/dL (ref 3.5–5.2)
Alkaline Phosphatase: 80 U/L (ref 39–117)
BUN: 12 mg/dL (ref 6–23)
CO2: 27 mEq/L (ref 19–32)
Calcium: 9.9 mg/dL (ref 8.4–10.5)
Chloride: 100 mEq/L (ref 96–112)
Creatinine, Ser: 1.05 mg/dL (ref 0.40–1.20)
GFR: 48.71 mL/min — ABNORMAL LOW (ref >60.00)
Glucose, Bld: 163 mg/dL — ABNORMAL HIGH (ref 70–99)
Potassium: 3.9 mEq/L (ref 3.5–5.1)
Sodium: 139 mEq/L (ref 135–145)
Total Bilirubin: 0.3 mg/dL (ref 0.2–1.2)
Total Protein: 6.8 g/dL (ref 6.0–8.3)

## 2022-01-28 LAB — VITAMIN B12: Vitamin B-12: 371 pg/mL (ref 211–911)

## 2022-01-28 LAB — VITAMIN D 25 HYDROXY (VIT D DEFICIENCY, FRACTURES): VITD: 34.05 ng/mL (ref 30.00–100.00)

## 2022-01-28 LAB — TSH: TSH: 1.06 u[IU]/mL (ref 0.35–5.50)

## 2022-02-03 ENCOUNTER — Telehealth: Payer: Self-pay | Admitting: Primary Care

## 2022-02-03 NOTE — Telephone Encounter (Signed)
Noted  

## 2022-02-03 NOTE — Telephone Encounter (Signed)
Aaron Edelman from [facility] called in stating that patient cancelled start of care due to not feeling well. He stated she is scheduled today to start PT. Thank you!

## 2022-02-07 DIAGNOSIS — F331 Major depressive disorder, recurrent, moderate: Secondary | ICD-10-CM

## 2022-02-08 ENCOUNTER — Ambulatory Visit (INDEPENDENT_AMBULATORY_CARE_PROVIDER_SITE_OTHER): Payer: Medicare PPO | Admitting: Neurology

## 2022-02-08 ENCOUNTER — Encounter: Payer: Self-pay | Admitting: Neurology

## 2022-02-08 VITALS — BP 128/69 | HR 95 | Resp 20 | Ht 64.0 in | Wt 168.0 lb

## 2022-02-08 DIAGNOSIS — G43709 Chronic migraine without aura, not intractable, without status migrainosus: Secondary | ICD-10-CM

## 2022-02-08 DIAGNOSIS — F03A0 Unspecified dementia, mild, without behavioral disturbance, psychotic disturbance, mood disturbance, and anxiety: Secondary | ICD-10-CM

## 2022-02-08 MED ORDER — SUMATRIPTAN SUCCINATE 25 MG PO TABS
25.0000 mg | ORAL_TABLET | ORAL | 11 refills | Status: DC | PRN
Start: 2022-02-08 — End: 2022-12-28

## 2022-02-08 MED ORDER — MEMANTINE HCL 5 MG PO TABS: ORAL_TABLET | ORAL | 3 refills | Status: DC

## 2022-02-08 MED ORDER — DULOXETINE HCL 60 MG PO CPEP: 60.0000 mg | ORAL_CAPSULE | Freq: Every day | ORAL | 0 refills | Status: DC

## 2022-02-08 MED ORDER — DONEPEZIL HCL 10 MG PO TABS: 10.0000 mg | ORAL_TABLET | Freq: Every day | ORAL | 3 refills | Status: DC

## 2022-02-08 NOTE — Patient Instructions (Signed)
Always good to see you.  Start Memantine '5mg'$ : take 1 tablet every night for 2 weeks, then increase to 1 tablet twice a day  2. Continue Donepezil '10mg'$  daily  3. Refills sent for sumatriptan. Continue Botox with Dr. Tomi Likens  4. Your refills for Propranolol and Gabapentin were sent by your PCP Alma Friendly  5. Follow-up in 1 year, call for any changes   FALL PRECAUTIONS: Be cautious when walking. Scan the area for obstacles that may increase the risk of trips and falls. When getting up in the mornings, sit up at the edge of the bed for a few minutes before getting out of bed. Consider elevating the bed at the head end to avoid drop of blood pressure when getting up. Walk always in a well-lit room (use night lights in the walls). Avoid area rugs or power cords from appliances in the middle of the walkways. Use a walker or a cane if necessary and consider physical therapy for balance exercise. Get your eyesight checked regularly.  FINANCIAL OVERSIGHT: Supervision, especially oversight when making financial decisions or transactions is also recommended.  HOME SAFETY: Consider the safety of the kitchen when operating appliances like stoves, microwave oven, and blender. Consider having supervision and share cooking responsibilities until no longer able to participate in those. Accidents with firearms and other hazards in the house should be identified and addressed as well.  ABILITY TO BE LEFT ALONE: If patient is unable to contact 911 operator, consider using LifeLine, or when the need is there, arrange for someone to stay with patients. Smoking is a fire hazard, consider supervision or cessation. Risk of wandering should be assessed by caregiver and if detected at any point, supervision and safe proof recommendations should be instituted.  MEDICATION SUPERVISION: Inability to self-administer medication needs to be constantly addressed. Implement a mechanism to ensure safe administration of the  medications.  RECOMMENDATIONS FOR ALL PATIENTS WITH MEMORY PROBLEMS: 1. Continue to exercise (Recommend 30 minutes of walking everyday, or 3 hours every week) 2. Increase social interactions - continue going to [place] and enjoy social gatherings with friends and family 3. Eat healthy, avoid fried foods and eat more fruits and vegetables 4. Maintain adequate blood pressure, blood sugar, and blood cholesterol level. Reducing the risk of stroke and cardiovascular disease also helps promoting better memory. 5. Avoid stressful situations. Live a simple life and avoid aggravations. Organize your time and prepare for the next day in anticipation. 6. Sleep well, avoid any interruptions of sleep and avoid any distractions in the bedroom that may interfere with adequate sleep quality 7. Avoid sugar, avoid sweets as there is a strong link between excessive sugar intake, diabetes, and cognitive impairment Te Mediterranean diet has been shown to help patients reduce the risk of progressive memory disorders and reduces cardiovascular risk. This includes eating fish, eat fruits and green leafy vegetables, nuts like almonds and hazelnuts, walnuts, and also use olive oil. Avoid fast foods and fried foods as much as possible. Avoid sweets and sugar as sugar use has been linked to worsening of memory function.  There is always a concern of gradual progression of memory problems. If this is the case, then we may need to adjust level of care according to patient needs. Support, both to the patient and caregiver, should then be put into place.

## 2022-02-08 NOTE — Progress Notes (Signed)
NEUROLOGY FOLLOW UP OFFICE NOTE  Shannon Higgins [id number] 27-Mar-1935  HISTORY OF PRESENT ILLNESS: I had the pleasure of seeing Shannon Higgins in follow-up in the neurology clinic on 02/08/2022.  The patient was last seen a year ago for migraines and mild dementia. She is again accompanied by her daughter Shannon Higgins who helps supplement the history today.  Records and images were personally reviewed where available. Since her last visit, she reports doing well. She feels her memory is fine, but Shannon Higgins reports worsening of short-term memory. Her other daughter Shannon Higgins fixes her medications, she denies missing any doses however Shannon Higgins nods behind her. She may forget to pay a bill sometimes, and apparently has been getting fraudulent charges on her credit card every month. When Shannon Higgins reminds her about a purchase, she says "I never ordered that." She does not drive. She does not cook. She is independent with dressing and bathing, Shannon Higgins is on standby for showers. Mood is fine, Shannon Higgins reports some depression where she did not want to do anything but watch TV, improved after medication adjustment. No paranoia or hallucinations. Sleep is good. She reports migraines are well-controlled with Botox, she gets a headache after the injection and none until her next injection. The migraines are not as bad, she has prn sumatriptan for migraine rescue. She is also on Propranolol 36m daily for migraine prophylaxis, and Gabapentin 8061mTID for neuropathy, refills were sent by her PCP. She was started physical therapy. She has had a couple of "slips" out of her chair and bed.   History on Initial Assessment 02/08/2017: This is a 87ear old right-handed woman with a history of hypertension, diabetes, rheumatoid arthritis, breast cancer, presenting for evaluation of chronic headaches and confusion.    1. Headaches Headaches started in her 87sShe went for a period of time with no significant headaches for a year, then last June headaches  recurred lasting for weeks at a time. She has 20 to 25 headache days a month, with nausea, upset stomach, sometimes diarrhea. She describes a lot of pressure and throbbing. She had a headache that lasted for 2 months, resolving 2 weeks ago, then this weekend headaches started back and have been ongoing for 4-5 days. She denies any visual obscurations, no photo/phonophobia. She does not usually have dizziness with the headaches, but recently has been so dizzy she had difficulty getting to the bathroom the past week, with described spinning sensation. Meclizine helps some. She has had vertigo in the past. No family history of migraines. She has tried several headache preventative medications, including amitriptyline, Topiramate. She is taking Propranolol 855maily with no side effects. She is also on Neurontin for back pain and Cymbalta for mood.    2. Confusion. She reports word-finding difficulties for the past 6 months. Her daughter mostly describes the episodic confusion as forgetting what she had previously told the patient. She has been taking an old meclizine prescription, but forgot that her daughter told her this information. She has not been driving for the past month due to glaucoma, denied previously getting lost driving. After her husband passed away 1.5 years ago, she moved in with her daughter and son-in-law. They are in charge of finances. Her daughter states she forgets her medications. One time she forgot to take her Cymbalta for a week. No paranoia or hallucinations. She used to be a social butterfly but now likes to be more at home. There is a strong family history of dementia in her mother  and multiple siblings.    She denies any diplopia, dysarthria/dysphagia, anosmia, or tremors. She has chronic neck and back pain and neuropathy in both feet radiating up her legs. She has a little urinary incontinence, no bowel issues. They report several falls with the dizziness. After she hit her head  with fall in July, she had a bad headache for 6 weeks. She was veering to the left and went to the ER on 7/13 where head CT did not show any acute changes. She got a migraine cocktail but headache was still bad the next day.   MRI/MRA brain without contrast 08/2018: 1. No acute intracranial abnormality. 2. Chronic ischemic microangiopathy. 3. Multifocal severe stenosis and/or short segment occlusion of the right posterior cerebral artery. Otherwise normal intracranial MRA.    PAST MEDICAL HISTORY: Past Medical History:  Diagnosis Date   Acute metabolic encephalopathy 38/38/1840   Acute tubular injury of transplanted kidney ([place]) 06/14/2016   AKI (acute kidney injury) ([place]) 06/14/2016   Asthma    Breast cancer ([place]) 2015   left   Bronchitis    CAP (community acquired pneumonia) 04/29/2012   Chronic sinusitis    Closed fracture of medial malleolus 05/11/2018   Confusion 12/06/2016   Diabetes mellitus without complication (HCC)    Family history of breast cancer    GERD (gastroesophageal reflux disease)    History of ankle fracture 05/14/2018   Last Assessment & Plan:  With a recent fall. Right medial malleous. Has ortho follow up soon, splinted now and tylenol helps her pain   History of recurrent UTIs    Hypertension    Hypokalemia 04/26/2012   Hyponatremia    Hypothyroidism    Imbalance 09/02/2019   Insomnia    Migraine    Neuropathic pain    Ovarian cancer ([place]) 2022   Ovarian mass, left 07/14/2020   Personal history of breast cancer    Personal history of chemotherapy current   bilateral ovarian ca   Pneumonia    Rheumatoid arthritis ([place])    Sepsis ([place]) 04/26/2012   Sepsis secondary to UTI ([place]) 04/26/2012   Sleep apnea    Stroke [facility])    seen in CT scan   Thyroid disease    Transient hypotension 06/14/2016    MEDICATIONS: Current Outpatient Medications on File Prior to Visit  Medication Sig Dispense Refill   Accu-Chek FastClix Lancets MISC USE AS INSTRUCTED TO TEST  BLOOD SUGAR DAILY 102 each 1   ACCU-CHEK GUIDE test strip USE AS INSTRUCTED TO TEST BLOOD SUGAR DAILY 100 strip 1   aspirin EC 325 MG tablet Take 325 mg by mouth daily.     blood glucose meter kit and supplies Dispense based on patient and insurance preference. Use up to 2 times daily as directed. (FOR ICD-10 E10.9, E11.9). 1 each 0   Botulinum Toxin Type A (BOTOX) 200 units SOLR Inject 155 units IM into multiple sites of face head and neck every 90 days 1 each 4   Cranberry 1000 MG CAPS Take 1 capsule by mouth 2 (two) times daily.     donepezil (ARICEPT) 10 MG tablet Take 1 tablet (10 mg total) by mouth at bedtime. 90 tablet 3   dorzolamide-timolol (COSOPT) 22.3-6.8 MG/ML ophthalmic solution Place 1 drop into both eyes 2 (two) times daily.     DULoxetine (CYMBALTA) 60 MG capsule Take 1 capsule (60 mg total) by mouth daily. for anxiety and depression. 90 capsule 0   fluticasone (FLONASE) 50 MCG/ACT nasal spray  USE 2 SPRAYS IN EACH NOSTRIL DAILY AS NEEDED FOR ALLERGIES OR RHINITIS 48 g 0   gabapentin (NEURONTIN) 800 MG tablet TAKE 1 TABLET BY MOUTH 3 TIMES A DAY FOR NEUROPATHY 270 tablet 2   latanoprost (XALATAN) 0.005 % ophthalmic solution Place 1 drop into both eyes at bedtime.     levothyroxine (SYNTHROID) 112 MCG tablet TAKE 1 TABLET EVERY MORNING MONDAY THROUGH SATURDAY AND 1/2 TABLET ON SUNDAY. TAKE ON AN EMPTY STOMACH WITH WATER ONLY 78 tablet 2   meclizine (ANTIVERT) 25 MG tablet TAKE 1 TABLET (25 MG TOTAL) BY MOUTH 2 (TWO) TIMES DAILY AS NEEDED FOR DIZZINESS. 180 tablet 0   meloxicam (MOBIC) 15 MG tablet TAKE 1 TABLET DAILY AS NEEDED FOR PAIN 90 tablet 0   Netarsudil Dimesylate (RHOPRESSA) 0.02 % SOLN Place 1 drop into both eyes daily at 6 (six) AM.     omeprazole (PRILOSEC) 20 MG capsule TAKE 1 CAPSULE TWICE DAILY FOR HEARTBURN. 180 capsule 1   pilocarpine (PILOCAR) 2 % ophthalmic solution Place 1 drop into both eyes 4 (four) times daily.      propranolol (INDERAL) 80 MG tablet Take 1  tablet (80 mg total) by mouth daily. For headache prevention. 90 tablet 1   rosuvastatin (CRESTOR) 10 MG tablet Take 1 tablet (10 mg total) by mouth daily. for cholesterol 90 tablet 0   SUMAtriptan (IMITREX) 25 MG tablet Take 1 tablet (25 mg total) by mouth every 2 (two) hours as needed for migraine. May repeat in 2 hours if headache persists or recurs. 10 tablet 5   traZODone (DESYREL) 100 MG tablet TAKE 2 TABLETS AT BEDTIME AS NEEDED FOR SLEEP 180 tablet 1   No current facility-administered medications on file prior to visit.    ALLERGIES: No Known Allergies  FAMILY HISTORY: Family History  Problem Relation Age of Onset   Diabetes Daughter    Hypertension Daughter    Fibromyalgia Daughter    GER disease Daughter    Fibromyalgia Daughter    Crohn's disease Daughter    Asthma Daughter    Hypertension Mother    Breast cancer Other    Breast cancer Niece    Uterine cancer Niece     SOCIAL HISTORY: Social History   Socioeconomic History   Marital status: Widowed    Spouse name: Not on file   Number of children: 2   Years of education: Not on file   Highest education level: Not on file  Occupational History   Occupation: retired   Tobacco Use   Smoking status: Never   Smokeless tobacco: Never  Vaping Use   Vaping Use: Never used  Substance and Sexual Activity   Alcohol use: No   Drug use: No   Sexual activity: Not Currently  Other Topics Concern   Not on file  Social History Narrative   Pt lives in 1 story home with her daughter, Shannon Higgins and [name]'s husband   Has 2 adult daughters   Highest level of education: some college   Worked mainly as Web designer.   Social Determinants of Health   Financial Resource Strain: Low Risk  (10/06/2021)   Overall Financial Resource Strain (CARDIA)    Difficulty of Paying Living Expenses: Not very hard  Food Insecurity: No Food Insecurity (10/06/2021)   Hunger Vital Sign    Worried About Running Out of Food in the Last  Year: Never true    Ran Out of Food in the Last Year: Never true  Transportation Needs:  No Transportation Needs (09/13/2018)   PRAPARE - Hydrologist (Medical): No    Lack of Transportation (Non-Medical): No  Physical Activity: Inactive (09/13/2018)   Exercise Vital Sign    Days of Exercise per Week: 0 days    Minutes of Exercise per Session: 0 min  Stress: No Stress Concern Present (09/13/2018)   [place]    Feeling of Stress : Not at all  Social Connections: Somewhat Isolated (09/13/2018)   Social Connection and Isolation Panel [NHANES]    Frequency of Communication with Friends and Family: More than three times a week    Frequency of Social Gatherings with Friends and Family: Never    Attends Religious Services: More than 4 times per year    Active Member of [employer] or Organizations: No    Attends Archivist Meetings: Never    Marital Status: Widowed  Intimate Partner Violence: Not At Risk (09/13/2018)   Humiliation, Afraid, Rape, and Kick questionnaire    Fear of Current or Ex-Partner: No    Emotionally Abused: No    Physically Abused: No    Sexually Abused: No     PHYSICAL EXAM: Vitals:   02/08/22 1139  BP: 128/69  Pulse: 95  Resp: 20  SpO2: 98%   General: No acute distress Head:  Normocephalic/atraumatic Skin/Extremities: No rash, no edema Neurological Exam: alert and oriented to person, place, and time. No aphasia or dysarthria. Fund of knowledge is appropriate.  Recent and remote memory are intact.  Attention and concentration are normal. MMSE 28/30.    02/08/2022   12:00 PM 03/02/2021   11:00 AM 02/26/2019   11:31 AM  MMSE - Mini Mental State Exam  Orientation to time 4 5 5   Orientation to Place 5 5 5   Registration 3 3 3   Attention/ Calculation 5 4 5   Recall 3 3 3   Language- name 2 objects 2 2   Language- repeat 1 1 1   Language- follow 3 step command 2 2    Language- read & follow direction 1 1   Write a sentence 1 1   Copy design 1 1   Total score 28 28    Cranial nerves: Pupils equal, round. Extraocular movements intact with no nystagmus. Visual fields full.  No facial asymmetry.  Motor: Bulk and tone normal, muscle strength 5/5 throughout with no pronator drift.   Finger to nose testing intact.  Gait slow and cautious with and without walker, no ataxia. No tremors.   IMPRESSION: This is an 87 yo RH woman with a history of hypertension, diabetes, rheumatoid arthritis, breast cancer, with chronic migraines and mild dementia. Migraines well-controlled with Botox, continue follow-up with Dr. Tomi Likens. She has prn sumatriptan for rescue. Family reports worsening memory, MMSE today 28/30 (28/30 in 03/2021). We discussed adding low dose Memantine 28m BID to Donepezil 131mdaily. Side effects and expectations discussed. Her Propranolol and Gabapentin were refilled by her PCP. She was advised to have increased supervision with finances. She does not drive. Follow-up in 1 year, call for any changes.    Thank you for allowing me to participate in her care.  Please do not hesitate to call for any questions or concerns.   KaEllouise NewerM.D.   CC: KaMalena EdmanNP

## 2022-02-09 ENCOUNTER — Other Ambulatory Visit: Payer: Self-pay | Admitting: Primary Care

## 2022-02-09 DIAGNOSIS — M792 Neuralgia and neuritis, unspecified: Secondary | ICD-10-CM

## 2022-02-10 ENCOUNTER — Telehealth: Payer: Self-pay | Admitting: Primary Care

## 2022-02-10 NOTE — Telephone Encounter (Signed)
LVM for pt to rtn my call to schedule AWV with [facility] call back # [phone] 

## 2022-02-14 ENCOUNTER — Other Ambulatory Visit: Payer: Self-pay

## 2022-02-14 ENCOUNTER — Emergency Department
Admission: EM | Admit: 2022-02-14 | Discharge: 2022-02-14 | Disposition: A | Discharge status: Home / Self Care | Payer: Medicare PPO | Attending: Emergency Medicine | Admitting: Emergency Medicine

## 2022-02-14 ENCOUNTER — Emergency Department: Payer: Medicare PPO

## 2022-02-14 DIAGNOSIS — Y93E1 Activity, personal bathing and showering: Secondary | ICD-10-CM | POA: Diagnosis not present

## 2022-02-14 DIAGNOSIS — S0101XA Laceration without foreign body of scalp, initial encounter: Secondary | ICD-10-CM

## 2022-02-14 DIAGNOSIS — W19XXXA Unspecified fall, initial encounter: Secondary | ICD-10-CM

## 2022-02-14 DIAGNOSIS — W01198A Fall on same level from slipping, tripping and stumbling with subsequent striking against other object, initial encounter: Secondary | ICD-10-CM | POA: Insufficient documentation

## 2022-02-14 DIAGNOSIS — S0990XA Unspecified injury of head, initial encounter: Secondary | ICD-10-CM | POA: Diagnosis present

## 2022-02-14 NOTE — ED Triage Notes (Signed)
Presents to ED due to a fall at home while trying to get out of the shower. Pt denies blood thinners. Bleeding controlled. Pt denies LOC and N/V/D

## 2022-02-14 NOTE — ED Provider Notes (Signed)
   [facility] Provider Note    Event Date/Time   First MD Initiated Contact with Patient 02/14/22 1151     (approximate)   History   Fall   HPI  Shannon Higgins is a 87 y.o. female with extensive past medical history who presents after a fall while in the shower.  Patient apparently lost her balance.  She did hit her head, denies LOC.  No extremity injuries.  No back pain no abdominal pain no chest pain.     Physical Exam   Triage Vital Signs: ED Triage Vitals  Enc Vitals Group     BP 02/14/22 1153 115/73     Pulse Rate 02/14/22 1153 (!) 58     Resp 02/14/22 1153 18     Temp 02/14/22 1153 98.2 F (36.8 C)     Temp Source 02/14/22 1153 Oral     SpO2 --      Weight 02/14/22 1155 76.2 kg (167 lb 15.9 oz)     Height 02/14/22 1155 1.626 m ('5\' 4"'$ )     Head Circumference --      Peak Flow --      Pain Score 02/14/22 1154 0     Pain Loc --      Pain Edu? --      Excl. in [place]? --     Most recent vital signs: Vitals:   02/14/22 1153  BP: 115/73  Pulse: (!) 58  Resp: 18  Temp: 98.2 F (36.8 C)     General: Awake, no distress.  CV:  Good peripheral perfusion.  Resp:  Normal effort.  Abd:  No distention.  Other:  Approximately 3 cm shallow scalp laceration, bleeding controlled   ED Results / Procedures / Treatments   Labs (all labs ordered are listed, but only abnormal results are displayed) Labs Reviewed - No data to display   EKG     RADIOLOGY CT head viewed interpret by me, no acute abnormality    PROCEDURES:  Critical Care performed:   Procedures   MEDICATIONS ORDERED IN ED: Medications - No data to display   IMPRESSION / MDM / [place] / ED COURSE  I reviewed the triage vital signs and the nursing notes. Patient's presentation is most consistent with acute presentation with potential threat to life or bodily function.   Patient presents after fall with head injury.  Differential includes scalp  laceration, mild head injury, intracranial hemorrhage  She is on aspirin.  For CT scan which is reassuring.  Scalp laceration is shallow, dressing provided, no repair needed       FINAL CLINICAL IMPRESSION(S) / ED DIAGNOSES   Final diagnoses:  Fall, initial encounter  Laceration of scalp, initial encounter  Injury of head, initial encounter     Rx / DC Orders   ED Discharge Orders     None        Note:  This document was prepared using Dragon voice recognition software and may include unintentional dictation errors.   Lavonia Drafts, MD 02/14/22 1531

## 2022-02-16 ENCOUNTER — Ambulatory Visit: Payer: Medicare PPO

## 2022-02-18 ENCOUNTER — Telehealth: Payer: Self-pay

## 2022-02-18 ENCOUNTER — Ambulatory Visit: Payer: Medicare PPO

## 2022-02-18 NOTE — Progress Notes (Addendum)
Chronic Care Management Pharmacy Assistant   Name: Shannon Higgins  MRN: [id number] DOB: [birth year]  Reason for Encounter: CCM (Hosptial Follow Up)  Medications: Outpatient Encounter Medications as of 02/18/2022  Medication Sig   Accu-Chek FastClix Lancets MISC USE AS INSTRUCTED TO TEST BLOOD SUGAR DAILY   ACCU-CHEK GUIDE test strip USE AS INSTRUCTED TO TEST BLOOD SUGAR DAILY   aspirin EC 325 MG tablet Take 325 mg by mouth daily.   blood glucose meter kit and supplies Dispense based on patient and insurance preference. Use up to 2 times daily as directed. (FOR ICD-10 E10.9, E11.9).   Botulinum Toxin Type A (BOTOX) 200 units SOLR Inject 155 units IM into multiple sites of face head and neck every 90 days   Cranberry 1000 MG CAPS Take 1 capsule by mouth 2 (two) times daily.   donepezil (ARICEPT) 10 MG tablet Take 1 tablet (10 mg total) by mouth at bedtime.   dorzolamide-timolol (COSOPT) 22.3-6.8 MG/ML ophthalmic solution Place 1 drop into both eyes 2 (two) times daily.   DULoxetine (CYMBALTA) 60 MG capsule Take 1 capsule (60 mg total) by mouth daily. for anxiety and depression.   fluticasone (FLONASE) 50 MCG/ACT nasal spray USE 2 SPRAYS IN EACH NOSTRIL DAILY AS NEEDED FOR ALLERGIES OR RHINITIS   gabapentin (NEURONTIN) 800 MG tablet TAKE 1 TABLET BY MOUTH 3 TIMES A DAY FOR NEUROPATHY   latanoprost (XALATAN) 0.005 % ophthalmic solution Place 1 drop into both eyes at bedtime.   levothyroxine (SYNTHROID) 112 MCG tablet TAKE 1 TABLET EVERY MORNING MONDAY THROUGH SATURDAY AND 1/2 TABLET ON SUNDAY. TAKE ON AN EMPTY STOMACH WITH WATER ONLY   meclizine (ANTIVERT) 25 MG tablet TAKE 1 TABLET (25 MG TOTAL) BY MOUTH 2 (TWO) TIMES DAILY AS NEEDED FOR DIZZINESS.   meloxicam (MOBIC) 15 MG tablet TAKE 1 TABLET DAILY AS NEEDED FOR PAIN   memantine (NAMENDA) 5 MG tablet Take 1 tablet twice a day   Netarsudil Dimesylate (RHOPRESSA) 0.02 % SOLN Place 1 drop into both eyes daily at 6 (six) AM.   omeprazole  (PRILOSEC) 20 MG capsule TAKE 1 CAPSULE TWICE DAILY FOR HEARTBURN.   pilocarpine (PILOCAR) 2 % ophthalmic solution Place 1 drop into both eyes 4 (four) times daily.    propranolol (INDERAL) 80 MG tablet Take 1 tablet (80 mg total) by mouth daily. For headache prevention.   rosuvastatin (CRESTOR) 10 MG tablet Take 1 tablet (10 mg total) by mouth daily. for cholesterol   SUMAtriptan (IMITREX) 25 MG tablet Take 1 tablet (25 mg total) by mouth every 2 (two) hours as needed for migraine. May repeat in 2 hours if headache persists or recurs.   traZODone (DESYREL) 100 MG tablet TAKE 2 TABLETS AT BEDTIME AS NEEDED FOR SLEEP   No facility-administered encounter medications on file as of 02/18/2022.   Reviewed hospital notes for details of recent visit. Has patient been contacted by Transitions of Care team? No Has patient seen PCP/specialist for hospital follow up (summarize OV if yes): No  Admitted to the ED on 02/14/2022. Discharge date was 02/14/2022.  Discharged from [facility].   Discharge diagnosis (Principal Problem): Fall Patient was discharged to Home  Brief summary of hospital course: Patient presents after fall with head injury.  Differential includes scalp laceration, mild head injury, intracranial hemorrhage  She is on aspirin.  For CT scan which is reassuring.  Scalp laceration is shallow, dressing provided, no repair needed  Medications that remain the same after Hospital Discharge:??  -  All other medications will remain the same.    Next CCM appt: None scheduled  Other upcoming appts: PCP appointment on 02/18/2022 for AWV  Charlene Brooke, PharmD notified and will determine if action is needed.  Charlene Brooke, CPP notified  Marijean Niemann, [place] Clinical Pharmacy Assistant [phone]   Pharmacist addendum: Workup reassuring. Nothing further needed.  Charlene Brooke, PharmD, BCACP 02/18/22 3:54 PM

## 2022-02-18 NOTE — Telephone Encounter (Signed)
Can we look into who was supposed to contact her for her phone AWV? Shannon Higgins someone?

## 2022-02-23 NOTE — Telephone Encounter (Signed)
Can someone please address the missed AWV? Patient and her daughter waited for several hours and no one called.

## 2022-02-24 NOTE — Telephone Encounter (Signed)
Reviewed patient chart. Looks like she was called to reschedule AWV.

## 2022-02-28 ENCOUNTER — Encounter (INDEPENDENT_AMBULATORY_CARE_PROVIDER_SITE_OTHER): Payer: Self-pay

## 2022-03-01 ENCOUNTER — Ambulatory Visit (INDEPENDENT_AMBULATORY_CARE_PROVIDER_SITE_OTHER): Payer: Medicare PPO | Admitting: *Deleted

## 2022-03-01 DIAGNOSIS — Z0000 Encounter for general adult medical examination without abnormal findings: Secondary | ICD-10-CM

## 2022-03-01 NOTE — Progress Notes (Signed)
Subjective:   Shannon Higgins is a 87 y.o. female who presents for Medicare Annual (Subsequent) preventive examination.  I connected with  CAMARY SOSA on 03/01/22 by a telephone enabled telemedicine application and verified that I am speaking with the correct person using two identifiers.   I discussed the limitations of evaluation and management by telemedicine. The patient expressed understanding and agreed to proceed.  Patient location: home  Provider location:  Tele-health-home    Review of Systems     Cardiac Risk Factors include: advanced age (>32mn, >>59women);diabetes mellitus;hypertension     Objective:    Today's Vitals   There is no height or weight on file to calculate BMI.     03/01/2022    1:11 PM 02/08/2022   11:39 AM 09/20/2021   11:08 AM 05/05/2021    3:43 PM 03/31/2021    2:35 PM 03/02/2021   11:39 AM 11/24/2020    9:13 AM  Advanced Directives  Does Patient Have a Medical Advance Directive? _0  No No  Would patient like information on creating a medical advance directive? No - Patient declined  No - Patient declined No - Patient declined No - Patient declined      Current Medications (verified) Outpatient Encounter Medications as of 03/01/2022  Medication Sig   Accu-Chek FastClix Lancets MISC USE AS INSTRUCTED TO TEST BLOOD SUGAR DAILY   ACCU-CHEK GUIDE test strip USE AS INSTRUCTED TO TEST BLOOD SUGAR DAILY   aspirin EC 325 MG tablet Take 325 mg by mouth daily.   blood glucose meter kit and supplies Dispense based on patient and insurance preference. Use up to 2 times daily as directed. (FOR ICD-10 E10.9, E11.9).   Botulinum Toxin Type A (BOTOX) 200 units SOLR Inject 155 units IM into multiple sites of face head and neck every 90 days   Cranberry 1000 MG CAPS Take 1 capsule by mouth 2 (two) times daily.   donepezil (ARICEPT) 10 MG tablet Take 1 tablet (10 mg total) by mouth at bedtime.   dorzolamide-timolol (COSOPT) 22.3-6.8 MG/ML  ophthalmic solution Place 1 drop into both eyes 2 (two) times daily.   DULoxetine (CYMBALTA) 60 MG capsule Take 1 capsule (60 mg total) by mouth daily. for anxiety and depression.   fluticasone (FLONASE) 50 MCG/ACT nasal spray USE 2 SPRAYS IN EACH NOSTRIL DAILY AS NEEDED FOR ALLERGIES OR RHINITIS   gabapentin (NEURONTIN) 800 MG tablet TAKE 1 TABLET BY MOUTH 3 TIMES A DAY FOR NEUROPATHY   latanoprost (XALATAN) 0.005 % ophthalmic solution Place 1 drop into both eyes at bedtime.   levothyroxine (SYNTHROID) 112 MCG tablet TAKE 1 TABLET EVERY MORNING MONDAY THROUGH SATURDAY AND 1/2 TABLET ON SUNDAY. TAKE ON AN EMPTY STOMACH WITH WATER ONLY   meclizine (ANTIVERT) 25 MG tablet TAKE 1 TABLET (25 MG TOTAL) BY MOUTH 2 (TWO) TIMES DAILY AS NEEDED FOR DIZZINESS.   meloxicam (MOBIC) 15 MG tablet TAKE 1 TABLET DAILY AS NEEDED FOR PAIN   memantine (NAMENDA) 5 MG tablet Take 1 tablet twice a day   Netarsudil Dimesylate (RHOPRESSA) 0.02 % SOLN Place 1 drop into both eyes daily at 6 (six) AM.   omeprazole (PRILOSEC) 20 MG capsule TAKE 1 CAPSULE TWICE DAILY FOR HEARTBURN.   pilocarpine (PILOCAR) 2 % ophthalmic solution Place 1 drop into both eyes 4 (four) times daily.    propranolol (INDERAL) 80 MG tablet Take 1 tablet (80 mg total) by mouth daily. For headache prevention.   rosuvastatin (CRESTOR)  10 MG tablet Take 1 tablet (10 mg total) by mouth daily. for cholesterol   SUMAtriptan (IMITREX) 25 MG tablet Take 1 tablet (25 mg total) by mouth every 2 (two) hours as needed for migraine. May repeat in 2 hours if headache persists or recurs.   traZODone (DESYREL) 100 MG tablet TAKE 2 TABLETS AT BEDTIME AS NEEDED FOR SLEEP   No facility-administered encounter medications on file as of 03/01/2022.    Allergies (verified) Patient has no known allergies.   History: Past Medical History:  Diagnosis Date   Acute metabolic encephalopathy 83/01/4075   Acute tubular injury of transplanted kidney ([place]) 06/14/2016   AKI  (acute kidney injury) ([place]) 06/14/2016   Asthma    Breast cancer ([place]) 2015   left   Bronchitis    CAP (community acquired pneumonia) 04/29/2012   Chronic sinusitis    Closed fracture of medial malleolus 05/11/2018   Confusion 12/06/2016   Diabetes mellitus without complication (HCC)    Family history of breast cancer    GERD (gastroesophageal reflux disease)    History of ankle fracture 05/14/2018   Last Assessment & Plan:  With a recent fall. Right medial malleous. Has ortho follow up soon, splinted now and tylenol helps her pain   History of recurrent UTIs    Hypertension    Hypokalemia 04/26/2012   Hyponatremia    Hypothyroidism    Imbalance 09/02/2019   Insomnia    Migraine    Neuropathic pain    Ovarian cancer ([place]) 2022   Ovarian mass, left 07/14/2020   Personal history of breast cancer    Personal history of chemotherapy current   bilateral ovarian ca   Pneumonia    Rheumatoid arthritis ([place])    Sepsis ([place]) 04/26/2012   Sepsis secondary to UTI ([place]) 04/26/2012   Sleep apnea    Stroke [facility])    seen in CT scan   Thyroid disease    Transient hypotension 06/14/2016   Past Surgical History:  Procedure Laterality Date   ABDOMINAL HYSTERECTOMY     BACK SURGERY     BREAST BIOPSY Right ?`   benign   BREAST LUMPECTOMY Left 2015   breast ca   CHOLECYSTECTOMY     JOINT REPLACEMENT     LAPAROTOMY N/A 07/14/2020   Procedure: LAPAROTOMY;  Surgeon: Gae Dry, MD;  Location: [facility] ORS;  Service: Gynecology;  Laterality: N/A;   OOPHORECTOMY     OVARIAN CYST REMOVAL     PORTA CATH INSERTION N/A 07/27/2020   Procedure: PORTA CATH INSERTION;  Surgeon: Algernon Huxley, MD;  Location: [place] [facility];  Service: Cardiovascular;  Laterality: N/A;   PORTA CATH REMOVAL N/A 04/05/2021   Procedure: PORTA CATH REMOVAL;  Surgeon: Algernon Huxley, MD;  Location: [place] [facility];  Service: Cardiovascular;  Laterality: N/A;   REPLACEMENT TOTAL KNEE Right    SALPINGOOPHORECTOMY Bilateral  07/14/2020   Procedure: OPEN SALPINGO OOPHORECTOMY;  Surgeon: Gae Dry, MD;  Location: [facility] ORS;  Service: Gynecology;  Laterality: Bilateral;   TOTAL SHOULDER REPLACEMENT Left    Family History  Problem Relation Age of Onset   Diabetes Daughter    Hypertension Daughter    Fibromyalgia Daughter    GER disease Daughter    Fibromyalgia Daughter    Crohn's disease Daughter    Asthma Daughter    Hypertension Mother    Breast cancer Other    Breast cancer Niece    Uterine cancer Niece  Social History   Socioeconomic History   Marital status: Widowed    Spouse name: Not on file   Number of children: 2   Years of education: Not on file   Highest education level: Not on file  Occupational History   Occupation: retired   Tobacco Use   Smoking status: Never   Smokeless tobacco: Never  Vaping Use   Vaping Use: Never used  Substance and Sexual Activity   Alcohol use: No   Drug use: No   Sexual activity: Not Currently  Other Topics Concern   Not on file  Social History Narrative   Pt lives in 1 story home with her daughter, [name] and [name]'s husband   Has 2 adult daughters   Highest level of education: some college   Worked mainly as Web designer.   Social Determinants of Health   Financial Resource Strain: Low Risk  (03/01/2022)   Overall Financial Resource Strain (CARDIA)    Difficulty of Paying Living Expenses: Not hard at all  Food Insecurity: No Food Insecurity (03/01/2022)   Hunger Vital Sign    Worried About Running Out of Food in the Last Year: Never true    Ran Out of Food in the Last Year: Never true  Transportation Needs: No Transportation Needs (03/01/2022)   PRAPARE - Hydrologist (Medical): No    Lack of Transportation (Non-Medical): No  Physical Activity: Inactive (03/01/2022)   Exercise Vital Sign    Days of Exercise per Week: 0 days    Minutes of Exercise per Session: 0 min  Stress: No Stress Concern  Present (03/01/2022)   [place]    Feeling of Stress : Not at all  Social Connections: Socially Isolated (03/01/2022)   Social Connection and Isolation Panel [NHANES]    Frequency of Communication with Friends and Family: Twice a week    Frequency of Social Gatherings with Friends and Family: Never    Attends Religious Services: 1 to 4 times per year    Active Member of [employer] or Organizations: No    Attends Archivist Meetings: Never    Marital Status: Widowed    Tobacco Counseling Counseling given: Not Answered   Clinical Intake:  Pre-visit preparation completed: Yes  Pain : No/denies pain     Diabetes: Yes CBG done?: No Did pt. bring in CBG monitor from home?: No  How often do you need to have someone help you when you read instructions, pamphlets, or other written materials from your doctor or pharmacy?: 1 - Never  Diabetic?  Yes  Nutrition Risk Assessment:  Has the patient had any N/V/D within the last 2 months?  No  Does the patient have any non-healing wounds?  No  Has the patient had any unintentional weight loss or weight gain?  No   Diabetes:  Is the patient diabetic?  Yes  If diabetic, was a CBG obtained today?  No  Did the patient bring in their glucometer from home?  No  How often do you monitor your CBG's? Does not check blood sugar.   Financial Strains and Diabetes Management:  Are you having any financial strains with the device, your supplies or your medication? No .  Does the patient want to be seen by Chronic Care Management for management of their diabetes?  No  Would the patient like to be referred to a Nutritionist or for Diabetic Management?  No  Diabetic Exams:  Diabetic Eye Exam: Completed  Pt has been advised about the importance in completing this exam  Diabetic Foot Exam:  Pt has been advised about the importance in completing this exam. .    Interpreter  Needed?: No  Information entered by :: Leroy Kennedy LPN   Activities of Daily Living    03/01/2022    1:15 PM 04/05/2021   10:21 AM  In your present state of health, do you have any difficulty performing the following activities:  Hearing? 0 0  Vision? 0 0  Difficulty concentrating or making decisions? 1 0  Walking or climbing stairs? 1 1  Dressing or bathing? 0 0  Doing errands, shopping? 1   Preparing Food and eating ? N   Using the Toilet? N   In the past six months, have you accidently leaked urine? Y   Do you have problems with loss of bowel control? N   Managing your Medications? N   Managing your Finances? N   Housekeeping or managing your Housekeeping? N     Patient Care Team: Pleas Koch, NP as PCP - General (Internal Medicine) Cameron Sprang, MD as Consulting Physician (Neurology) Clent Jacks, RN as Oncology Nurse Navigator [place], [place], [facility] as Pharmacist (Pharmacist) Cameron Sprang, MD as Consulting Physician (Neurology)  Indicate any recent Medical Services you may have received from other than [facility] providers in the past year (date may be approximate).     Assessment:   This is a routine wellness examination for [place].  Hearing/Vision screen Hearing Screening - Comments:: No trouble hearing Vision Screening - Comments:: Dr. Valetta Close Up to date  Dietary issues and exercise activities discussed: Current Exercise Habits: The patient does not participate in regular exercise at present, Exercise limited by: orthopedic condition(s)   Goals Addressed             This Visit's Progress    Increase physical activity         Depression Screen    03/01/2022    1:18 PM 01/27/2022    4:41 PM 01/16/2021   11:06 AM 01/16/2021   11:05 AM 05/05/2020    3:13 PM 02/26/2019   11:20 AM 06/21/2017    3:52 PM  PHQ 2/9 Scores  PHQ - 2 Score 2 3 0 0 0 0 3  PHQ- 9 Score _0 0 0 12    Fall Risk    02/08/2022   11:39 AM 03/02/2021   11:39 AM  01/16/2021   11:04 AM 07/24/2020    1:39 PM 05/08/2019   10:13 AM  Fall Risk   Falls in the past year? _1 0 1  Comment Simultaneous filing. User may not have seen previous data.      Number falls in past yr: 1 0 1 0 1  Injury with Fall? 0 0 0 0 0  Comment Simultaneous filing. User may not have seen previous data.      Risk for fall due to :   Impaired balance/gait  Impaired balance/gait  Follow up Falls evaluation completed  Falls evaluation completed  Falls evaluation completed    FALL RISK PREVENTION PERTAINING TO THE HOME:  Any stairs in or around the home? Yes  If so, are there any without handrails? No  Home free of loose throw rugs in walkways, pet beds, electrical cords, etc? Yes  Adequate lighting in your home to reduce risk of falls? Yes   ASSISTIVE  DEVICES UTILIZED TO PREVENT FALLS:  Life alert? No  Use of a cane, walker or w/c? Yes  Grab bars in the bathroom? No  Shower chair or bench in shower? Yes  Elevated toilet seat or a handicapped toilet? Yes   TIMED UP AND GO:  Was the test performed? No .    Cognitive Function:    02/08/2022   12:00 PM 03/02/2021   11:00 AM 02/26/2019   11:31 AM  MMSE - Mini Mental State Exam  Orientation to time _0 Orientation to Place _1 Registration _2 Attention/ Calculation _3 Recall _4 Language- name 2 objects 2 2   Language- repeat _5 Language- follow 3 step command 2 2   Language- read & follow direction 1 1   Write a sentence 1 1   Copy design 1 1   Total score 28 28       02/08/2017    3:00 PM  Montreal Cognitive Assessment   Visuospatial/ Executive (0/5) 3  Naming (0/3) 1  Attention: Read list of digits (0/2) 2  Attention: Read list of letters (0/1) 0  Attention: Serial 7 subtraction starting at 100 (0/3) 3  Language: Repeat phrase (0/2) 2  Language : Fluency (0/1) 0  Abstraction (0/2) 2  Delayed Recall (0/5) 3  Orientation (0/6) 6  Total 22      03/01/2022    1:13 PM 01/16/2021    11:00 AM  6CIT Screen  What Year? 0 points 0 points  What month? 0 points 0 points  What time? 0 points 0 points  Count back from 20 0 points 0 points  Months in reverse 0 points 0 points  Repeat phrase 2 points 0 points  Total Score 2 points 0 points    Immunizations Immunization History  Administered Date(s) Administered   Fluad Quad(high Dose 65+) 05/05/2020, 01/15/2021, 01/27/2022   Influenza,inj,Quad PF,6+ Mos 01/29/2018, 03/04/2019   PFIZER(Purple Top)SARS-COV-2 Vaccination 05/21/2019, 06/10/2019, 07/03/2020   Pneumococcal Polysaccharide-23 06/20/2018   Tdap 12/09/2018   Zoster Recombinat (Shingrix) 03/06/2019    TDAP status: Up to date  Flu Vaccine status: Up to date  Pneumococcal vaccine status: Due, Education has been provided regarding the importance of this vaccine. Advised may receive this vaccine at local pharmacy or Health Dept. Aware to provide a copy of the vaccination record if obtained from local pharmacy or Health Dept. Verbalized acceptance and understanding.  Covid-19 vaccine status: Information provided on how to obtain vaccines.   Qualifies for Shingles Vaccine? Yes   Zostavax completed No   Shingrix Completed?: No.    Education has been provided regarding the importance of this vaccine. Patient has been advised to call insurance company to determine out of pocket expense if they have not yet received this vaccine. Advised may also receive vaccine at local pharmacy or Health Dept. Verbalized acceptance and understanding.  Screening Tests Health Maintenance  Topic Date Due   FOOT EXAM  Never done   Diabetic kidney evaluation - Urine ACR  12/30/2018   Pneumonia Vaccine 33+ Years old (2 - PCV) 06/21/2019   OPHTHALMOLOGY EXAM  09/18/2019   HEMOGLOBIN A1C  01/28/2022   COVID-19 Vaccine (4 - Pfizer risk series) 03/17/2022 (Originally 08/28/2020)   Zoster Vaccines- Shingrix (2 of 2) 06/01/2022 (Originally 05/01/2019)   Diabetic kidney evaluation - GFR  measurement  01/28/2023   Medicare Annual Wellness (AWV)  03/02/2023   TETANUS/TDAP  12/08/2028   INFLUENZA VACCINE  Completed   DEXA SCAN  Completed   HPV VACCINES  Aged Out    Health Maintenance  Health Maintenance Due  Topic Date Due   FOOT EXAM  Never done   Diabetic kidney evaluation - Urine ACR  12/30/2018   Pneumonia Vaccine 46+ Years old (2 - PCV) 06/21/2019   OPHTHALMOLOGY EXAM  09/18/2019   HEMOGLOBIN A1C  01/28/2022    Colorectal cancer screening: No longer required.   Mammogram scheduled 03-18-2022  Bone Density status: Completed 2021. Results reflect: Bone density results: OSTEOPENIA. Repeat every 3-5 years.  Lung Cancer Screening: (Low Dose CT Chest recommended if Age 62-80 years, 30 pack-year currently smoking OR have quit w/in 15years.) does not qualify.   Lung Cancer Screening Referral:   Additional Screening:  Hepatitis C Screening: does not qualify; Completed 2022  Vision Screening: Recommended annual ophthalmology exams for early detection of glaucoma and other disorders of the eye. Is the patient up to date with their annual eye exam?  Yes  Who is the provider or what is the name of the office in which the patient attends annual eye exams? [facility] If pt is not established with a provider, would they like to be referred to a provider to establish care? No .   Dental Screening: Recommended annual dental exams for proper oral hygiene  Community Resource Referral / Chronic Care Management: CRR required this visit?  No   CCM required this visit?  No      Plan:     I have personally reviewed and noted the following in the patient's chart:   Medical and social history Use of alcohol, tobacco or illicit drugs  Current medications and supplements including opioid prescriptions. Patient is not currently taking opioid prescriptions. Functional ability and status Nutritional status Physical activity Advanced directives List of other  physicians Hospitalizations, surgeries, and ER visits in previous 12 months Vitals Screenings to include cognitive, depression, and falls Referrals and appointments  In addition, I have reviewed and discussed with patient certain preventive protocols, quality metrics, and best practice recommendations. A written personalized care plan for preventive services as well as general preventive health recommendations were provided to patient.     Leroy Kennedy, LPN   11/57/2620   Nurse Notes:

## 2022-03-01 NOTE — Patient Instructions (Signed)
[name] , Thank you for taking time to come for your Medicare Wellness Visit. I appreciate your ongoing commitment to your health goals. Please review the following plan we discussed and let me know if I can assist you in the future.   These are the goals we discussed:  Goals      Activity and Exercise Increased     Evidence-based guidance:  Review current exercise levels.  Assess patient perspective on exercise or activity level, barriers to increasing activity, motivation and readiness for change.  Recommend or set healthy exercise goal based on individual tolerance.  Encourage small steps toward making change in amount of exercise or activity.  Urge reduction of sedentary activities or screen time.  Promote group activities within the community or with family or support person.  Consider referral to rehabiliation therapist for assessment and exercise/activity plan.   Notes:      Increase physical activity     Manage My Medicine     Timeframe:  Long-Range Goal Priority:  High Start Date:       04/08/21                      Expected End Date:    04/08/22                  Follow Up Date June 2023   - call for medicine refill 2 or 3 days before it runs out - call if I am sick and can't take my medicine - keep a list of all the medicines I take; vitamins and herbals too - use a pillbox to sort medicine -Reduce levothyroxine by taking 1/2 tablet on 'Sundays.   Why is this important?   These steps will help you keep on track with your medicines.   Notes:      Patient Stated     10'$ /27/2020, I will increase my exercise and help to improve my unsteadiness.         This is a list of the screening recommended for you and due dates:  Health Maintenance  Topic Date Due   Complete foot exam   Never done   Yearly kidney health urinalysis for diabetes  12/30/2018   Pneumonia Vaccine (2 - PCV) 06/21/2019   Eye exam for diabetics  09/18/2019   Hemoglobin A1C  01/28/2022   COVID-19  Vaccine (4 - Pfizer risk series) 03/17/2022*   Zoster (Shingles) Vaccine (2 of 2) 06/01/2022*   Yearly kidney function blood test for diabetes  01/28/2023   Medicare Annual Wellness Visit  03/02/2023   Tetanus Vaccine  12/08/2028   Flu Shot  Completed   DEXA scan (bone density measurement)  Completed   HPV Vaccine  Aged Out  *Topic was postponed. The date shown is not the original due date.    Advanced directives: Education provided      Preventive Care 17 Years and Older, Female Preventive care refers to lifestyle choices and visits with your health care provider that can promote health and wellness. What does preventive care include? A yearly physical exam. This is also called an annual well check. Dental exams once or twice a year. Routine eye exams. Ask your health care provider how often you should have your eyes checked. Personal lifestyle choices, including: Daily care of your teeth and gums. Regular physical activity. Eating a healthy diet. Avoiding tobacco and drug use. Limiting alcohol use. Practicing safe sex. Taking low-dose aspirin every day. Taking vitamin and  mineral supplements as recommended by your health care provider. What happens during an annual well check? The services and screenings done by your health care provider during your annual well check will depend on your age, overall health, lifestyle risk factors, and family history of disease. Counseling  Your health care provider may ask you questions about your: Alcohol use. Tobacco use. Drug use. Emotional well-being. Home and relationship well-being. Sexual activity. Eating habits. History of falls. Memory and ability to understand (cognition). Work and work Statistician. Reproductive health. Screening  You may have the following tests or measurements: Height, weight, and BMI. Blood pressure. Lipid and cholesterol levels. These may be checked every 5 years, or more frequently if you are over  26 years old. Skin check. Lung cancer screening. You may have this screening every year starting at age 66 if you have a 30-pack-year history of smoking and currently smoke or have quit within the past 15 years. Fecal occult blood test (FOBT) of the stool. You may have this test every year starting at age 81. Flexible sigmoidoscopy or colonoscopy. You may have a sigmoidoscopy every 5 years or a colonoscopy every 10 years starting at age 56. Hepatitis C blood test. Hepatitis B blood test. Sexually transmitted disease (STD) testing. Diabetes screening. This is done by checking your blood sugar (glucose) after you have not eaten for a while (fasting). You may have this done every 1-3 years. Bone density scan. This is done to screen for osteoporosis. You may have this done starting at age 48. Mammogram. This may be done every 1-2 years. Talk to your health care provider about how often you should have regular mammograms. Talk with your health care provider about your test results, treatment options, and if necessary, the need for more tests. Vaccines  Your health care provider may recommend certain vaccines, such as: Influenza vaccine. This is recommended every year. Tetanus, diphtheria, and acellular pertussis (Tdap, Td) vaccine. You may need a Td booster every 10 years. Zoster vaccine. You may need this after age 41. Pneumococcal 13-valent conjugate (PCV13) vaccine. One dose is recommended after age 44. Pneumococcal polysaccharide (PPSV23) vaccine. One dose is recommended after age 78. Talk to your health care provider about which screenings and vaccines you need and how often you need them. This information is not intended to replace advice given to you by your health care provider. Make sure you discuss any questions you have with your health care provider. Document Released: 05/15/2015 Document Revised: 01/06/2016 Document Reviewed: 02/17/2015 Elsevier Interactive Patient Education  2017  [place] Prevention in the Home Falls can cause injuries. They can happen to people of all ages. There are many things you can do to make your home safe and to help prevent falls. What can I do on the outside of my home? Regularly fix the edges of walkways and driveways and fix any cracks. Remove anything that might make you trip as you walk through a door, such as a raised step or threshold. Trim any bushes or trees on the path to your home. Use bright outdoor lighting. Clear any walking paths of anything that might make someone trip, such as rocks or tools. Regularly check to see if handrails are loose or broken. Make sure that both sides of any steps have handrails. Any raised decks and porches should have guardrails on the edges. Have any leaves, snow, or ice cleared regularly. Use sand or salt on walking paths during winter. Clean up any spills in  your garage right away. This includes oil or grease spills. What can I do in the bathroom? Use night lights. Install grab bars by the toilet and in the tub and shower. Do not use towel bars as grab bars. Use non-skid mats or decals in the tub or shower. If you need to sit down in the shower, use a plastic, non-slip stool. Keep the floor dry. Clean up any water that spills on the floor as soon as it happens. Remove soap buildup in the tub or shower regularly. Attach bath mats securely with double-sided non-slip rug tape. Do not have throw rugs and other things on the floor that can make you trip. What can I do in the bedroom? Use night lights. Make sure that you have a light by your bed that is easy to reach. Do not use any sheets or blankets that are too big for your bed. They should not hang down onto the floor. Have a firm chair that has side arms. You can use this for support while you get dressed. Do not have throw rugs and other things on the floor that can make you trip. What can I do in the kitchen? Clean up any spills  right away. Avoid walking on wet floors. Keep items that you use a lot in easy-to-reach places. If you need to reach something above you, use a strong step stool that has a grab bar. Keep electrical cords out of the way. Do not use floor polish or wax that makes floors slippery. If you must use wax, use non-skid floor wax. Do not have throw rugs and other things on the floor that can make you trip. What can I do with my stairs? Do not leave any items on the stairs. Make sure that there are handrails on both sides of the stairs and use them. Fix handrails that are broken or loose. Make sure that handrails are as long as the stairways. Check any carpeting to make sure that it is firmly attached to the stairs. Fix any carpet that is loose or worn. Avoid having throw rugs at the top or bottom of the stairs. If you do have throw rugs, attach them to the floor with carpet tape. Make sure that you have a light switch at the top of the stairs and the bottom of the stairs. If you do not have them, ask someone to add them for you. What else can I do to help prevent falls? Wear shoes that: Do not have high heels. Have rubber bottoms. Are comfortable and fit you well. Are closed at the toe. Do not wear sandals. If you use a stepladder: Make sure that it is fully opened. Do not climb a closed stepladder. Make sure that both sides of the stepladder are locked into place. Ask someone to hold it for you, if possible. Clearly mark and make sure that you can see: Any grab bars or handrails. First and last steps. Where the edge of each step is. Use tools that help you move around (mobility aids) if they are needed. These include: Canes. Walkers. Scooters. Crutches. Turn on the lights when you go into a dark area. Replace any light bulbs as soon as they burn out. Set up your furniture so you have a clear path. Avoid moving your furniture around. If any of your floors are uneven, fix them. If there are  any pets around you, be aware of where they are. Review your medicines with your doctor. Some medicines  can make you feel dizzy. This can increase your chance of falling. Ask your doctor what other things that you can do to help prevent falls. This information is not intended to replace advice given to you by your health care provider. Make sure you discuss any questions you have with your health care provider. Document Released: 02/12/2009 Document Revised: 09/24/2015 Document Reviewed: 05/23/2014 Elsevier Interactive Patient Education  2017 [employer].

## 2022-03-02 ENCOUNTER — Inpatient Hospital Stay: Payer: Medicare PPO | Attending: Oncology | Admitting: Obstetrics and Gynecology

## 2022-03-02 ENCOUNTER — Encounter: Payer: Self-pay | Admitting: Obstetrics and Gynecology

## 2022-03-02 VITALS — BP 154/70 | HR 73 | Temp 96.7°F | Resp 17 | Wt 165.5 lb

## 2022-03-02 DIAGNOSIS — R180 Malignant ascites: Secondary | ICD-10-CM | POA: Diagnosis not present

## 2022-03-02 DIAGNOSIS — Z8543 Personal history of malignant neoplasm of ovary: Secondary | ICD-10-CM | POA: Diagnosis not present

## 2022-03-02 DIAGNOSIS — Z08 Encounter for follow-up examination after completed treatment for malignant neoplasm: Secondary | ICD-10-CM

## 2022-03-02 DIAGNOSIS — C569 Malignant neoplasm of unspecified ovary: Secondary | ICD-10-CM

## 2022-03-02 DIAGNOSIS — Z9221 Personal history of antineoplastic chemotherapy: Secondary | ICD-10-CM | POA: Insufficient documentation

## 2022-03-02 DIAGNOSIS — Z90721 Acquired absence of ovaries, unilateral: Secondary | ICD-10-CM | POA: Insufficient documentation

## 2022-03-02 DIAGNOSIS — B372 Candidiasis of skin and nail: Secondary | ICD-10-CM

## 2022-03-02 MED ORDER — NYSTATIN 100000 UNIT/GM EX POWD: 1.0000 | Freq: Two times a day (BID) | CUTANEOUS | 0 refills | Status: DC

## 2022-03-02 NOTE — Progress Notes (Signed)
Gynecologic Oncology Consult Visit   Referring Provider: Dr. 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 female, who returns to clinic for continued surveillance of high grade serous ovarian cancer s/p BSO, laparotomy, partial omentectomy with some spillage on 07/14/20, followed by 4 cycles of adjuvant carbo-doxil, completed 09/2020 (last 2 cycles omitted d/t poor tolerance). She returns to clinic for continued surveillance.   CA 125 was not elevated at time of diagnosis but has been followed and has been normal.   Component Ref Range & Units 5 mo ago (09/20/21) 11 mo ago (03/31/21) 1 yr ago (11/24/20)  Cancer Antigen (CA) 125 0.0 - 38.1 U/mL 15.1 14.7 CM 20.3 CM   No interval imaging.    Gyn Oncology history 04/24/20 admitted to [facility].  Presented to the ER with several day history of weakness, confusion, intermittent fever and thought to have UTI with sepsis.   04/23/20 - CT Abdomen Pelvis w contrast 13.5 cm x 11.8 cm x 15.2 cm simple, cystic appearing area is seen within the pelvis along the midline. This extends from the lower abdomen to the region just above the urinary bladder. Marked severity right-sided hydronephrosis and hydroureter with mild right perinephric inflammatory fat stranding and delayed right renal cortical enhancement. No obstructing renal calculi are identified. The dilated right ureter extends to the level of a large pelvic cyst   Seen by Dr Kenton Kingfisher for the large pelvic mass.  CA 125  22.9 HE4   99 Postmenopausal ROMA 2.65  Underwent Bilateral salpingo-oophorectomy, laparotomy, partial omentectomy with Dr. Kenton Kingfisher and Dr. Gilman Schmidt on 07/14/20.  Controlled drainage of the mass done with purse string with some spillage.  No disease seen outside ovary.    07/14/20  DIAGNOSIS:  A. OVARY AND FALLOPIAN TUBE, LEFT; SALPINGO-OOPHORECTOMY:  - HIGH-GRADE SEROUS CARCINOMA INVOLVING SEROUS CYSTADENOMA OF OVARY,  SEE SUMMARY BELOW.  - FALLOPIAN TUBE NEGATIVE FOR INVASIVE AND INTRAEPITHELIAL CARCINOMA.   B. OVARY AND FALLOPIAN TUBE, RIGHT; SALPINGO-OOPHORECTOMY:  - HIGH-GRADE SEROUS CARCINOMA, ONE 1.0 CM FOCUS, INVOLVING FRAGMENTED  OVARY.  - FALLOPIAN TUBE NEGATIVE FOR INVASIVE AND INTRAEPITHELIAL CARCINOMA.   C. OMENTUM; BIOPSY:  - NEGATIVE FOR MALIGNANCY.   CANCER CASE SUMMARY: OVARY or FALLOPIAN TUBE or PRIMARY PERITONEUM. Standard(s): AJCC-UICC 8, FIGO Cancer Report 2018   SPECIMEN  Procedure: Bilateral salpingo-oophorectomy and omental biopsy  Specimen Integrity:       Left ovary integrity: Capsule ruptured (intraoperative spillage of cyst contents)       Right ovary integrity: Fragmented  TUMOR  Tumor Site: Bilateral ovaries  Tumor Size: Greatest dimension: 4 cm  Histologic Type: High-grade serous carcinoma  Histologic Grade: High-grade  Ovarian Surface Involvement:       Left: Not identified       Right: cannot be determined due to fragmented specimen  Fallopian Tube Surface Involvement: Not identified  Other Tissue/ Organ Involvement: Not applicable  Largest Extrapelvic Peritoneal Focus: Not applicable  Peritoneal/Ascitic Fluid Involvement: Results pending, see [id number]  Chemotherapy Response Score (CRS): Not applicable   REGIONAL LYMPH NODES  Regional Lymph Nodes Status: Not applicable (no regional lymph nodes submitted)   DISTANT METASTASIS  Distant Site(s) Involved, if applicable: Not applicable   ADDENDUM:  Peritoneal/Ascitic Fluid Involvement:  Not identified.  [id number]: pelvic washings negative for malignancy   FINAL PATHOLOGIC STAGE CLASSIFICATION (pTNM, AJCC 8th Edition):  pT1c1 (surgical spill),  pN not assigned (no nodes submitted)  pM - Not applicable  CA 125  07/21/20 98.1  Received four cycles of carboplatin/Doxil post op.  Did not get Taxol due to pre existing neuropathy. Dr. Janese Banks stopped treatment after 4 cycles due to poor tolerance.    Genetic testing [facility] MyRisk negative. HRD testing not done.   Problem List: Patient Active Problem List   Diagnosis Date Noted   Abnormal urine odor 01/27/2022   Other fatigue 01/27/2022   COVID-19 virus infection 12/17/2020   Immunocompromised ([place]) 12/17/2020   Personal history of breast cancer    Family history of breast cancer    High grade ovarian cancer ([place]) 07/21/2020   S/P bilateral oophorectomy 07/21/2020   Decreased mobility 09/02/2019   Preventative health care 03/04/2019   Insomnia 09/27/2018   History of TIA (transient ischemic attack) 09/13/2018   Recurrent UTI 06/20/2018   Frequent falls 05/14/2018   Gout 05/14/2018   Type 2 diabetes mellitus with peripheral neuropathy ([place]) 05/11/2018   Type 2 diabetes mellitus ([place]) 05/11/2018   Orthostatic hypotension 05/08/2018   History of breast cancer 10/17/2017   MDD (major depressive disorder), recurrent episode ([place]) 06/21/2017   Chronic low back pain 01/13/2017   Hyperlipidemia 12/06/2016   Glaucoma 12/06/2016   Neuropathic pain 12/06/2016   Malignant melanoma of skin ([place]) 12/06/2016   Dementia arising in the senium and presenium ([place]) 12/06/2016   Chronic headache disorder 12/06/2016   Hypothyroidism 06/14/2016   Anemia 04/26/2012   Hypertensive disorder 04/26/2012   Gastroesophageal reflux disease 04/26/2012   Obstructive sleep apnea syndrome 04/26/2012    Past Medical History: Past Medical History:  Diagnosis Date   Acute metabolic encephalopathy 60/63/0160   Acute tubular injury of transplanted kidney ([place]) 06/14/2016   AKI (acute kidney injury) ([place]) 06/14/2016   Asthma    Breast cancer ([place]) 2015   left   Bronchitis    CAP (community acquired pneumonia) 04/29/2012   Chronic sinusitis    Closed fracture of medial malleolus 05/11/2018   Confusion 12/06/2016   Diabetes mellitus without complication (HCC)    Family history of breast cancer    GERD (gastroesophageal reflux disease)    History of  ankle fracture 05/14/2018   Last Assessment & Plan:  With a recent fall. Right medial malleous. Has ortho follow up soon, splinted now and tylenol helps her pain   History of recurrent UTIs    Hypertension    Hypokalemia 04/26/2012   Hyponatremia    Hypothyroidism    Imbalance 09/02/2019   Insomnia    Migraine    Neuropathic pain    Ovarian cancer ([place]) 2022   Ovarian mass, left 07/14/2020   Personal history of breast cancer    Personal history of chemotherapy current   bilateral ovarian ca   Pneumonia    Rheumatoid arthritis ([place])    Sepsis ([place]) 04/26/2012   Sepsis secondary to UTI ([place]) 04/26/2012   Sleep apnea    Stroke [facility])    seen in CT scan   Thyroid disease    Transient hypotension 06/14/2016    Past Surgical History: Past Surgical History:  Procedure Laterality Date   ABDOMINAL HYSTERECTOMY     BACK SURGERY     BREAST BIOPSY Right ?`   benign   BREAST LUMPECTOMY Left 2015   breast ca   CHOLECYSTECTOMY     JOINT REPLACEMENT     LAPAROTOMY N/A 07/14/2020   Procedure: LAPAROTOMY;  Surgeon: Gae Dry, MD;  Location: [facility] ORS;  Service: Gynecology;  Laterality: N/A;   OOPHORECTOMY  OVARIAN CYST REMOVAL     PORTA CATH INSERTION N/A 07/27/2020   Procedure: PORTA CATH INSERTION;  Surgeon: Algernon Huxley, MD;  Location: [place] [facility] LAB;  Service: Cardiovascular;  Laterality: N/A;   PORTA CATH REMOVAL N/A 04/05/2021   Procedure: PORTA CATH REMOVAL;  Surgeon: Algernon Huxley, MD;  Location: [place] [facility];  Service: Cardiovascular;  Laterality: N/A;   REPLACEMENT TOTAL KNEE Right    SALPINGOOPHORECTOMY Bilateral 07/14/2020   Procedure: OPEN SALPINGO OOPHORECTOMY;  Surgeon: Gae Dry, MD;  Location: [facility] ORS;  Service: Gynecology;  Laterality: Bilateral;   TOTAL SHOULDER REPLACEMENT Left    Past Gynecologic History:  Post menopausal  OB History:  OB History  Gravida Para Term Preterm AB Living  _0 SAB IAB Ectopic Multiple Live Births                # Outcome Date GA Lbr Len/2nd Weight Sex Delivery Anes PTL Lv  2 Term           1 Term             Family History: Family History  Problem Relation Age of Onset   Diabetes Daughter    Hypertension Daughter    Fibromyalgia Daughter    GER disease Daughter    Fibromyalgia Daughter    Crohn's disease Daughter    Asthma Daughter    Hypertension Mother    Breast cancer Other    Breast cancer Niece    Uterine cancer Niece     Social History: Social History   Socioeconomic History   Marital status: Widowed    Spouse name: Not on file   Number of children: 2   Years of education: Not on file   Highest education level: Not on file  Occupational History   Occupation: retired   Tobacco Use   Smoking status: Never   Smokeless tobacco: Never  Vaping Use   Vaping Use: Never used  Substance and Sexual Activity   Alcohol use: No   Drug use: No   Sexual activity: Not Currently  Other Topics Concern   Not on file  Social History Narrative   Pt lives in 1 story home with her daughter, [name] and [name]'s husband   Has 2 adult daughters   Highest level of education: some college   Worked mainly as Web designer.   Social Determinants of Health   Financial Resource Strain: Low Risk  (03/01/2022)   Overall Financial Resource Strain (CARDIA)    Difficulty of Paying Living Expenses: Not hard at all  Food Insecurity: No Food Insecurity (03/01/2022)   Hunger Vital Sign    Worried About Running Out of Food in the Last Year: Never true    Ran Out of Food in the Last Year: Never true  Transportation Needs: No Transportation Needs (03/01/2022)   PRAPARE - Hydrologist (Medical): No    Lack of Transportation (Non-Medical): No  Physical Activity: Inactive (03/01/2022)   Exercise Vital Sign    Days of Exercise per Week: 0 days    Minutes of Exercise per Session: 0 min  Stress: No Stress Concern Present (03/01/2022)   [place]    Feeling of Stress : Not at all  Social Connections: Socially Isolated (03/01/2022)   Social Connection and Isolation Panel [NHANES]    Frequency of Communication with Friends and Family:  Twice a week    Frequency of Social Gatherings with Friends and Family: Never    Attends Religious Services: 1 to 4 times per year    Active Member of [employer] or Organizations: No    Attends Archivist Meetings: Never    Marital Status: Widowed  Intimate Partner Violence: Not At Risk (03/01/2022)   Humiliation, Afraid, Rape, and Kick questionnaire    Fear of Current or Ex-Partner: No    Emotionally Abused: No    Physically Abused: No    Sexually Abused: No   Immunization History  Administered Date(s) Administered   Fluad Quad(high Dose 65+) 05/05/2020, 01/15/2021, 01/27/2022   Influenza,inj,Quad PF,6+ Mos 01/29/2018, 03/04/2019   PFIZER(Purple Top)SARS-COV-2 Vaccination 05/21/2019, 06/10/2019, 07/03/2020   Pneumococcal Polysaccharide-23 06/20/2018   Tdap 12/09/2018   Zoster Recombinat (Shingrix) 03/06/2019    Allergies: No Known Allergies  Current Medications: Current Outpatient Medications  Medication Sig Dispense Refill   Accu-Chek FastClix Lancets MISC USE AS INSTRUCTED TO TEST BLOOD SUGAR DAILY 102 each 1   ACCU-CHEK GUIDE test strip USE AS INSTRUCTED TO TEST BLOOD SUGAR DAILY 100 strip 1   aspirin EC 325 MG tablet Take 325 mg by mouth daily.     blood glucose meter kit and supplies Dispense based on patient and insurance preference. Use up to 2 times daily as directed. (FOR ICD-10 E10.9, E11.9). 1 each 0   Botulinum Toxin Type A (BOTOX) 200 units SOLR Inject 155 units IM into multiple sites of face head and neck every 90 days 1 each 4   Cranberry 1000 MG CAPS Take 1 capsule by mouth 2 (two) times daily.     donepezil (ARICEPT) 10 MG tablet Take 1 tablet (10 mg total) by mouth at bedtime. 90 tablet 3   dorzolamide-timolol  (COSOPT) 22.3-6.8 MG/ML ophthalmic solution Place 1 drop into both eyes 2 (two) times daily.     DULoxetine (CYMBALTA) 60 MG capsule Take 1 capsule (60 mg total) by mouth daily. for anxiety and depression. 90 capsule 0   fluticasone (FLONASE) 50 MCG/ACT nasal spray USE 2 SPRAYS IN EACH NOSTRIL DAILY AS NEEDED FOR ALLERGIES OR RHINITIS 48 g 0   gabapentin (NEURONTIN) 800 MG tablet TAKE 1 TABLET BY MOUTH 3 TIMES A DAY FOR NEUROPATHY 270 tablet 0   latanoprost (XALATAN) 0.005 % ophthalmic solution Place 1 drop into both eyes at bedtime.     levothyroxine (SYNTHROID) 112 MCG tablet TAKE 1 TABLET EVERY MORNING MONDAY THROUGH SATURDAY AND 1/2 TABLET ON SUNDAY. TAKE ON AN EMPTY STOMACH WITH WATER ONLY 78 tablet 2   meclizine (ANTIVERT) 25 MG tablet TAKE 1 TABLET (25 MG TOTAL) BY MOUTH 2 (TWO) TIMES DAILY AS NEEDED FOR DIZZINESS. 180 tablet 0   meloxicam (MOBIC) 15 MG tablet TAKE 1 TABLET DAILY AS NEEDED FOR PAIN 90 tablet 0   memantine (NAMENDA) 5 MG tablet Take 1 tablet twice a day 180 tablet 3   Netarsudil Dimesylate (RHOPRESSA) 0.02 % SOLN Place 1 drop into both eyes daily at 6 (six) AM.     omeprazole (PRILOSEC) 20 MG capsule TAKE 1 CAPSULE TWICE DAILY FOR HEARTBURN. 180 capsule 1   pilocarpine (PILOCAR) 2 % ophthalmic solution Place 1 drop into both eyes 4 (four) times daily.      propranolol (INDERAL) 80 MG tablet Take 1 tablet (80 mg total) by mouth daily. For headache prevention. 90 tablet 1   rosuvastatin (CRESTOR) 10 MG tablet Take 1 tablet (10 mg total) by mouth daily. for  cholesterol 90 tablet 0   SUMAtriptan (IMITREX) 25 MG tablet Take 1 tablet (25 mg total) by mouth every 2 (two) hours as needed for migraine. May repeat in 2 hours if headache persists or recurs. 10 tablet 11   traZODone (DESYREL) 100 MG tablet TAKE 2 TABLETS AT BEDTIME AS NEEDED FOR SLEEP 180 tablet 1   No current facility-administered medications for this visit.   Review of Systems General:  chronic fatigue/weakness o/w  no complaints Eyes: no complaints HEENT: no complaints Breasts: no complaints Pulmonary: chronic cough thought to be secondary to GERD o/w no complaints Cardiac: no complaints Gastrointestinal: no complaints Genitourinary/Sexual: no complaints Ob/Gyn: no complaints Musculoskeletal: chronic back pain no complaints Hematology: no complaints Neurologic/Psych: chronic peripheral neuropathy o/w complaints Skin: rash under breasts   Objective:  Physical Examination:  BP (!) 154/70   Pulse 73   Temp (!) 96.7 F (35.9 C)   Resp 17   Wt 165 lb 8 oz (75.1 kg)   SpO2 96%   BMI 28.41 kg/m     ECOG Performance Status: 1 - Symptomatic but completely ambulatory  GENERAL: Patient is a well appearing female in no acute distress HEENT:  Sclera clear. Anicteric NODES:  Negative axillary, supraclavicular, inguinal lymph node survery LUNGS:  Clear to auscultation bilaterally.   HEART:  Regular rate and rhythm.  ABDOMEN:  Soft, nontender, nondistended.  No hernias, incisions well healed. No masses or ascites EXTREMITIES:  No peripheral edema. Atraumatic. No cyanosis SKIN:  Clear with no obvious rashes or skin changes.  NEURO:  Nonfocal. Well oriented.  Appropriate affect.  Pelvic: Chaperoned by nursing EGBUS: no lesions Vagina- no discharge, bleeding, or lesions Cervix, Uterus, Ovaries: surgically absent  Rectovaginal: no masses  Lab Review CA125 ordered for today  Radiologic Imaging: No imaging on site today    Assessment:  JOURNIEE FELDKAMP is a 87 y.o. 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  Negative washings, omentum and abdominal survey as well as CT scan A/P. Finished four cycles of carbo/doxil in 6/22.  Last two cycles omitted due to poor tolerance.  CA125 not elevated at diagnosis. Last CA125 was 09/20/21 which was normal. No evidence of disease on exam today. Clinically, asymptomatic.    Candidiasis  Genetic  testing with [facility] MyRisk negative  HRD testing not done.   History of breast cancer age 33.  Medical co-morbidities complicating care: AODM with PN, HTN, stroke. Plan:   Problem List Items Addressed This Visit       Endocrine   High grade ovarian cancer [facility])   Other Visit Diagnoses     Encounter for follow-up surveillance of ovarian cancer    -  Primary      Discussed potential for PARP inhibitor therapy at some point in the future if she has a recurrence and somatic tumor tissue testing for HRD would be useful at that point.  Will see her back for follow up in 6 months and she will see Dr Janese Banks in the interim.   For candidiasis of the skin below the breasts discussed using nystatin powder.  I also recommended she follow-up with her primary care provider and review her glycemic control. She is aware that her glycemic control needs to improve.  However she is not always compliant with her diet and she would prefer to have her sweets rather than better glycemic control.  She knows that diabetes is associated with many end-stage organ diseases  such as cardiac, eye, renal, skin, and neurologic issues. Information regarding the drug was provided.  A total of 60 minutes were spent with the patient/family today; 50% was spent in education, counseling and coordination of care for stage ICi HGSOC.    I personally had a face to face interaction and evaluated the patient jointly with the NP student. Ms. Beckey Rutter scribed the note.  I have reviewed her history and available records and have performed the key portions of the physical exam including HEENT, general, neurologic, lymph node survey, abdominal exam, pelvic exam with my findings confirming those documented above by the APP and me.   I agree with the above documentation, assessment and plan which was fully formulated by me.  Counseling was completed by me.   I personally saw the patient and performed a substantive portion of this  encounter in conjunction with the listed APP as documented above.  Shannon Higgins Gaetana Michaelis, MD

## 2022-03-04 ENCOUNTER — Other Ambulatory Visit: Payer: Self-pay | Admitting: Primary Care

## 2022-03-04 DIAGNOSIS — G8929 Other chronic pain: Secondary | ICD-10-CM

## 2022-03-07 ENCOUNTER — Other Ambulatory Visit: Payer: Self-pay | Admitting: Neurology

## 2022-03-07 DIAGNOSIS — F03A0 Unspecified dementia, mild, without behavioral disturbance, psychotic disturbance, mood disturbance, and anxiety: Secondary | ICD-10-CM

## 2022-03-18 ENCOUNTER — Telehealth: Payer: Self-pay

## 2022-03-18 ENCOUNTER — Ambulatory Visit
Admission: RE | Admit: 2022-03-18 | Discharge: 2022-03-18 | Disposition: A | Discharge status: Home / Self Care | Payer: Medicare PPO | Source: Ambulatory Visit | Attending: Primary Care | Admitting: Primary Care

## 2022-03-18 DIAGNOSIS — Z1231 Encounter for screening mammogram for malignant neoplasm of breast: Secondary | ICD-10-CM | POA: Insufficient documentation

## 2022-03-18 NOTE — Telephone Encounter (Signed)
Attempted to contact [facility] regarding a fax received for a DOS 08/19/2020. Unable to reach person that sent fax.

## 2022-03-22 ENCOUNTER — Other Ambulatory Visit: Payer: Self-pay

## 2022-03-22 DIAGNOSIS — G43709 Chronic migraine without aura, not intractable, without status migrainosus: Secondary | ICD-10-CM

## 2022-03-22 MED ORDER — BOTOX 200 UNITS IJ SOLR: INTRAMUSCULAR | 4 refills | Status: DC

## 2022-03-22 NOTE — Telephone Encounter (Signed)
Refill for Botox received from [facility]. Refill sent Botox 200 units every 90 days with 4 refills

## 2022-03-23 ENCOUNTER — Ambulatory Visit: Payer: Medicare HMO | Admitting: Oncology

## 2022-03-23 ENCOUNTER — Other Ambulatory Visit: Payer: Medicare HMO

## 2022-03-28 ENCOUNTER — Telehealth: Payer: Self-pay

## 2022-03-28 ENCOUNTER — Encounter: Payer: Self-pay | Admitting: Oncology

## 2022-03-28 ENCOUNTER — Inpatient Hospital Stay (HOSPITAL_BASED_OUTPATIENT_CLINIC_OR_DEPARTMENT_OTHER): Payer: Medicare PPO | Admitting: Oncology

## 2022-03-28 ENCOUNTER — Inpatient Hospital Stay: Payer: Medicare PPO

## 2022-03-28 VITALS — BP 116/72 | HR 60 | Temp 98.6°F | Resp 18 | Wt 176.8 lb

## 2022-03-28 DIAGNOSIS — Z8543 Personal history of malignant neoplasm of ovary: Secondary | ICD-10-CM | POA: Diagnosis not present

## 2022-03-28 DIAGNOSIS — Z08 Encounter for follow-up examination after completed treatment for malignant neoplasm: Secondary | ICD-10-CM

## 2022-03-28 DIAGNOSIS — C569 Malignant neoplasm of unspecified ovary: Secondary | ICD-10-CM

## 2022-03-28 DIAGNOSIS — R309 Painful micturition, unspecified: Secondary | ICD-10-CM

## 2022-03-28 LAB — COMPREHENSIVE METABOLIC PANEL
ALT: 22 U/L (ref 0–44)
AST: 32 U/L (ref 15–41)
Albumin: 3.9 g/dL (ref 3.5–5.0)
Alkaline Phosphatase: 66 U/L (ref 38–126)
Anion gap: 9 (ref 5–15)
BUN: 13 mg/dL (ref 8–23)
CO2: 26 mmol/L (ref 22–32)
Calcium: 8.9 mg/dL (ref 8.9–10.3)
Chloride: 100 mmol/L (ref 98–111)
Creatinine, Ser: 0.96 mg/dL (ref 0.44–1.00)
GFR, Estimated: 58 mL/min — ABNORMAL LOW (ref >60)
Glucose, Bld: 211 mg/dL — ABNORMAL HIGH (ref 70–99)
Potassium: 4 mmol/L (ref 3.5–5.1)
Sodium: 135 mmol/L (ref 135–145)
Total Bilirubin: 0.4 mg/dL (ref 0.3–1.2)
Total Protein: 7.1 g/dL (ref 6.5–8.1)

## 2022-03-28 LAB — CBC WITH DIFFERENTIAL/PLATELET
Abs Immature Granulocytes: 0.03 10*3/uL (ref 0.00–0.07)
Basophils Absolute: 0 10*3/uL (ref 0.0–0.1)
Basophils Relative: 1 %
Eosinophils Absolute: 0.2 10*3/uL (ref 0.0–0.5)
Eosinophils Relative: 2 %
HCT: 36.6 % (ref 36.0–46.0)
Hemoglobin: 11.8 g/dL — ABNORMAL LOW (ref 12.0–15.0)
Immature Granulocytes: 0 %
Lymphocytes Relative: 28 %
Lymphs Abs: 2.2 10*3/uL (ref 0.7–4.0)
MCH: 31.1 pg (ref 26.0–34.0)
MCHC: 32.2 g/dL (ref 30.0–36.0)
MCV: 96.3 fL (ref 80.0–100.0)
Monocytes Absolute: 0.6 10*3/uL (ref 0.1–1.0)
Monocytes Relative: 8 %
Neutro Abs: 5 10*3/uL (ref 1.7–7.7)
Neutrophils Relative %: 61 %
Platelets: 217 10*3/uL (ref 150–400)
RBC: 3.8 MIL/uL — ABNORMAL LOW (ref 3.87–5.11)
RDW: 13.8 % (ref 11.5–15.5)
WBC: 8.1 10*3/uL (ref 4.0–10.5)
nRBC: 0 % (ref 0.0–0.2)

## 2022-03-28 LAB — URINALYSIS, COMPLETE (UACMP) WITH MICROSCOPIC
Bilirubin Urine: NEGATIVE
Glucose, UA: NEGATIVE mg/dL
Hgb urine dipstick: NEGATIVE
Ketones, ur: NEGATIVE mg/dL
Nitrite: NEGATIVE
Protein, ur: NEGATIVE mg/dL
Specific Gravity, Urine: 1.009 (ref 1.005–1.030)
pH: 5 (ref 5.0–8.0)

## 2022-03-28 MED ORDER — AMOXICILLIN-POT CLAVULANATE 875-125 MG PO TABS: 1.0000 | ORAL_TABLET | Freq: Two times a day (BID) | ORAL | 0 refills | Status: DC

## 2022-03-28 NOTE — Telephone Encounter (Signed)
Pt daughter is aware of suggestive UTI and have sent prescription for  augmentin 875 mg BID for 5 days. Daughter understands and agrees.

## 2022-03-29 LAB — CA 125: Cancer Antigen (CA) 125: 17.1 U/mL (ref 0.0–38.1)

## 2022-04-03 NOTE — Progress Notes (Signed)
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  She will is diagnosed with UTI and as a part of her Work-up had CT abdomen and pelvis with contrast.  That showed a 13.5 11.8 x 15.2 cm simple cystic appearing area within the pelvis.  Ultrasound of the pelvis showed 17 x 10.7 x 11.9 cm complex cystic midline pelvic mass.  Findings indeterminate and could reflect a cystic ovarian neoplasm.  She did have CA-125 checked which was normal at 22.9 but HD4 was elevated at 99.  Patient underwent bilateral salpingo-oophorectomy.  She has had prior hysterectomy.  Pathology showed high-grade serous carcinoma involving both ovaries but no involvement of the fallopian tube.  Omentum was negative for malignancy.  Lymph nodes not sampled.  Tumor size 4 cm high-grade.  FIGO stage IC peritoneal/ascitic fluid involvement was not identified.  Patient has baseline neuropathy. Therefore carbo/doxil chosen for adjuvant chemotherapy upto 6 cycles if tolerated.    Patient completed 4 cycles of Carbo doxil chemotherapy in July 2022 but could not tolerate further doses and was therefore stopped  Interval history-patient is  doing well for her age.  Denies any recent falls.  Appetite and weight have remained normal.  Blood sugars at times can be uncontrolled.  ECOG PS- 1 Pain scale- 0   Review of systems- Review of Systems  Constitutional:  Positive for malaise/fatigue. Negative for chills, fever and weight loss.  HENT:  Negative for congestion, ear discharge and nosebleeds.   Eyes:  Negative for blurred vision.  Respiratory:  Negative for cough, hemoptysis, sputum production, shortness of breath and wheezing.   Cardiovascular:  Negative for chest pain, palpitations, orthopnea and claudication.  Gastrointestinal:  Negative for abdominal pain, blood in stool, constipation, diarrhea, heartburn, melena, nausea and vomiting.  Genitourinary:  Positive for dysuria. Negative for flank pain, frequency, hematuria and urgency.  Musculoskeletal:  Negative for back pain, joint pain and myalgias.  Skin:  Negative for rash.  Neurological:  Negative for dizziness, tingling, focal weakness, seizures, weakness and headaches.  Endo/Heme/Allergies:  Does not bruise/bleed easily.  Psychiatric/Behavioral:  Negative for depression and suicidal ideas. The patient does not have insomnia.       No Known Allergies   Past Medical History:  Diagnosis Date   Acute metabolic encephalopathy 02/72/5366   Acute tubular injury of transplanted kidney ([place]) 06/14/2016   AKI (acute kidney injury) ([place]) 06/14/2016   Asthma    Breast cancer ([place]) 2015   left   Bronchitis    CAP (community acquired pneumonia) 04/29/2012   Chronic sinusitis    Closed fracture of medial malleolus 05/11/2018   Confusion 12/06/2016   Diabetes mellitus without complication (HCC)    Family history of breast cancer    GERD (gastroesophageal reflux disease)    History  of ankle fracture 05/14/2018   Last Assessment & Plan:  With a recent fall. Right medial malleous. Has ortho follow up soon, splinted now and tylenol helps her pain   History of recurrent UTIs     Hypertension    Hypokalemia 04/26/2012   Hyponatremia    Hypothyroidism    Imbalance 09/02/2019   Insomnia    Migraine    Neuropathic pain    Ovarian cancer ([place]) 2022   Ovarian mass, left 07/14/2020   Personal history of breast cancer    Personal history of chemotherapy current   bilateral ovarian ca   Pneumonia    Rheumatoid arthritis ([place])    Sepsis ([place]) 04/26/2012   Sepsis secondary to UTI ([place]) 04/26/2012   Sleep apnea    Stroke [facility])    seen in CT scan   Thyroid disease    Transient hypotension 06/14/2016     Past Surgical History:  Procedure Laterality Date   ABDOMINAL HYSTERECTOMY     BACK SURGERY     BREAST BIOPSY Right ?`   benign   BREAST LUMPECTOMY Left 2015   breast ca   CHOLECYSTECTOMY     JOINT REPLACEMENT     LAPAROTOMY N/A 07/14/2020   Procedure: LAPAROTOMY;  Surgeon: Gae Dry, MD;  Location: [facility] ORS;  Service: Gynecology;  Laterality: N/A;   OOPHORECTOMY     OVARIAN CYST REMOVAL     PORTA CATH INSERTION N/A 07/27/2020   Procedure: PORTA CATH INSERTION;  Surgeon: Algernon Huxley, MD;  Location: [place] [facility];  Service: Cardiovascular;  Laterality: N/A;   PORTA CATH REMOVAL N/A 04/05/2021   Procedure: PORTA CATH REMOVAL;  Surgeon: Algernon Huxley, MD;  Location: [place] [facility];  Service: Cardiovascular;  Laterality: N/A;   REPLACEMENT TOTAL KNEE Right    SALPINGOOPHORECTOMY Bilateral 07/14/2020   Procedure: OPEN SALPINGO OOPHORECTOMY;  Surgeon: Gae Dry, MD;  Location: [facility] ORS;  Service: Gynecology;  Laterality: Bilateral;   TOTAL SHOULDER REPLACEMENT Left     Social History   Socioeconomic History   Marital status: Widowed    Spouse name: Not on file   Number of children: 2   Years of education: Not on file   Highest education level: Not on file  Occupational History   Occupation: retired   Tobacco Use   Smoking status: Never   Smokeless tobacco: Never  Vaping Use   Vaping Use: Never used  Substance and Sexual Activity    Alcohol use: No   Drug use: No   Sexual activity: Not Currently  Other Topics Concern   Not on file  Social History Narrative   Pt lives in 1 story home with her daughter, [name] and [name]'s husband   Has 2 adult daughters   Highest level of education: some college   Worked mainly as Web designer.   Social Determinants of Health   Financial Resource Strain: Low Risk  (03/01/2022)   Overall Financial Resource Strain (CARDIA)    Difficulty of Paying Living Expenses: Not hard at all  Food Insecurity: No Food Insecurity (03/01/2022)   Hunger Vital Sign    Worried About Running Out of Food in the Last Year: Never true    Ran Out of Food in the Last Year: Never true  Transportation Needs: No Transportation Needs (03/01/2022)   PRAPARE - Hydrologist (Medical): No    Lack of Transportation (Non-Medical): No  Physical Activity: Inactive (03/01/2022)   Exercise  Vital Sign    Days of Exercise per Week: 0 days    Minutes of Exercise per Session: 0 min  Stress: No Stress Concern Present (03/01/2022)   [place]    Feeling of Stress : Not at all  Social Connections: Socially Isolated (03/01/2022)   Social Connection and Isolation Panel [NHANES]    Frequency of Communication with Friends and Family: Twice a week    Frequency of Social Gatherings with Friends and Family: Never    Attends Religious Services: 1 to 4 times per year    Active Member of [employer] or Organizations: No    Attends Archivist Meetings: Never    Marital Status: Widowed  Intimate Partner Violence: Not At Risk (03/01/2022)   Humiliation, Afraid, Rape, and Kick questionnaire    Fear of Current or Ex-Partner: No    Emotionally Abused: No    Physically Abused: No    Sexually Abused: No    Family History  Problem Relation Age of Onset   Diabetes Daughter    Hypertension Daughter    Fibromyalgia Daughter     GER disease Daughter    Fibromyalgia Daughter    Crohn's disease Daughter    Asthma Daughter    Hypertension Mother    Breast cancer Other    Breast cancer Niece    Uterine cancer Niece      Current Outpatient Medications:    amoxicillin-clavulanate (AUGMENTIN) 875-125 MG tablet, Take 1 tablet by mouth 2 (two) times daily., Disp: 10 tablet, Rfl: 0   donepezil (ARICEPT) 10 MG tablet, Take 1 tablet (10 mg total) by mouth at bedtime., Disp: 90 tablet, Rfl: 3   dorzolamide-timolol (COSOPT) 22.3-6.8 MG/ML ophthalmic solution, Place 1 drop into both eyes 2 (two) times daily., Disp: , Rfl:    DULoxetine (CYMBALTA) 60 MG capsule, Take 1 capsule (60 mg total) by mouth daily. for anxiety and depression., Disp: 90 capsule, Rfl: 0   fluticasone (FLONASE) 50 MCG/ACT nasal spray, USE 2 SPRAYS IN EACH NOSTRIL DAILY AS NEEDED FOR ALLERGIES OR RHINITIS, Disp: 48 g, Rfl: 0   gabapentin (NEURONTIN) 800 MG tablet, TAKE 1 TABLET BY MOUTH 3 TIMES A DAY FOR NEUROPATHY, Disp: 270 tablet, Rfl: 0   latanoprost (XALATAN) 0.005 % ophthalmic solution, Place 1 drop into both eyes at bedtime., Disp: , Rfl:    levothyroxine (SYNTHROID) 112 MCG tablet, TAKE 1 TABLET EVERY MORNING MONDAY THROUGH SATURDAY AND 1/2 TABLET ON SUNDAY. TAKE ON AN EMPTY STOMACH WITH WATER ONLY, Disp: 78 tablet, Rfl: 2   meloxicam (MOBIC) 15 MG tablet, TAKE 1 TABLET DAILY AS NEEDED FOR PAIN, Disp: 90 tablet, Rfl: 0   memantine (NAMENDA) 5 MG tablet, Take 1 tablet twice a day, Disp: 180 tablet, Rfl: 3   nystatin (MYCOSTATIN/NYSTOP) powder, Apply 1 Application topically 2 (two) times daily. May increase application to three times a day. Once redness clears can then use as needed., Disp: 15 g, Rfl: 0   omeprazole (PRILOSEC) 20 MG capsule, TAKE 1 CAPSULE TWICE DAILY FOR HEARTBURN., Disp: 180 capsule, Rfl: 1   pilocarpine (PILOCAR) 2 % ophthalmic solution, Place 1 drop into both eyes 4 (four) times daily. , Disp: , Rfl:    propranolol (INDERAL) 80 MG  tablet, Take 1 tablet (80 mg total) by mouth daily. For headache prevention., Disp: 90 tablet, Rfl: 1   rosuvastatin (CRESTOR) 10 MG tablet, Take 1 tablet (10 mg total) by mouth daily. for cholesterol, Disp: 90  tablet, Rfl: 0   traZODone (DESYREL) 100 MG tablet, TAKE 2 TABLETS AT BEDTIME AS NEEDED FOR SLEEP, Disp: 180 tablet, Rfl: 1   Accu-Chek FastClix Lancets MISC, USE AS INSTRUCTED TO TEST BLOOD SUGAR DAILY, Disp: 102 each, Rfl: 1   ACCU-CHEK GUIDE test strip, USE AS INSTRUCTED TO TEST BLOOD SUGAR DAILY, Disp: 100 strip, Rfl: 1   aspirin EC 325 MG tablet, Take 325 mg by mouth daily., Disp: , Rfl:    blood glucose meter kit and supplies, Dispense based on patient and insurance preference. Use up to 2 times daily as directed. (FOR ICD-10 E10.9, E11.9)., Disp: 1 each, Rfl: 0   botulinum toxin Type A (BOTOX) 200 units injection, Inject 155 units IM into multiple sites of face head and neck every 90 days, Disp: 1 each, Rfl: 4   Cranberry 1000 MG CAPS, Take 1 capsule by mouth 2 (two) times daily., Disp: , Rfl:    meclizine (ANTIVERT) 25 MG tablet, TAKE 1 TABLET (25 MG TOTAL) BY MOUTH 2 (TWO) TIMES DAILY AS NEEDED FOR DIZZINESS. (Patient not taking: Reported on 03/28/2022), Disp: 180 tablet, Rfl: 0   Netarsudil Dimesylate (RHOPRESSA) 0.02 % SOLN, Place 1 drop into both eyes daily at 6 (six) AM., Disp: , Rfl:    SUMAtriptan (IMITREX) 25 MG tablet, Take 1 tablet (25 mg total) by mouth every 2 (two) hours as needed for migraine. May repeat in 2 hours if headache persists or recurs. (Patient not taking: Reported on 03/28/2022), Disp: 10 tablet, Rfl: 11  Physical exam:  Vitals:   03/28/22 1509  BP: 116/72  Pulse: 60  Resp: 18  Temp: 98.6 F (37 C)  SpO2: 98%  Weight: 176 lb 12.8 oz (80.2 kg)   Physical Exam Constitutional:      General: She is not in acute distress. Cardiovascular:     Rate and Rhythm: Normal rate and regular rhythm.     Heart sounds: Normal heart sounds.  Pulmonary:      Effort: Pulmonary effort is normal.     Breath sounds: Normal breath sounds.  Abdominal:     General: Bowel sounds are normal.     Palpations: Abdomen is soft.  Skin:    General: Skin is warm and dry.  Neurological:     Mental Status: She is alert and oriented to person, place, and time.         Latest Ref Rng & Units 03/28/2022    2:25 PM  CMP  Glucose 70 - 99 mg/dL 211   BUN 8 - 23 mg/dL 13   Creatinine 0.44 - 1.00 mg/dL 0.96   Sodium 135 - 145 mmol/L 135   Potassium 3.5 - 5.1 mmol/L 4.0   Chloride 98 - 111 mmol/L 100   CO2 22 - 32 mmol/L 26   Calcium 8.9 - 10.3 mg/dL 8.9   Total Protein 6.5 - 8.1 g/dL 7.1   Total Bilirubin 0.3 - 1.2 mg/dL 0.4   Alkaline Phos 38 - 126 U/L 66   AST 15 - 41 U/L 32   ALT 0 - 44 U/L 22       Latest Ref Rng & Units 03/28/2022    2:25 PM  CBC  WBC 4.0 - 10.5 K/uL 8.1   Hemoglobin 12.0 - 15.0 g/dL 11.8   Hematocrit 36.0 - 46.0 % 36.6   Platelets 150 - 400 K/uL 217     No images are attached to the encounter.  MM 3D SCREEN BREAST BILATERAL  Result  Date: 03/22/2022 CLINICAL DATA:  Screening. EXAM: DIGITAL SCREENING BILATERAL MAMMOGRAM WITH TOMOSYNTHESIS AND CAD TECHNIQUE: Bilateral screening digital craniocaudal and mediolateral oblique mammograms were obtained. Bilateral screening digital breast tomosynthesis was performed. The images were evaluated with computer-aided detection. COMPARISON:  Previous exam(s). ACR Breast Density Category b: There are scattered areas of fibroglandular density. FINDINGS: There are no findings suspicious for malignancy. IMPRESSION: No mammographic evidence of malignancy. A result letter of this screening mammogram will be mailed directly to the patient. RECOMMENDATION: Screening mammogram in one year. (Code:SM-B-01Y) BI-RADS CATEGORY  1: Negative. Electronically Signed   By: Abelardo Diesel M.D.   On: 03/22/2022 10:06     Assessment and plan- Patient is a 87 y.o. female  with high-grade serous carcinoma of the  ovary FIGO stage I cpT1 cpNX cMX s/p bilateral salpingo-oophorectomy.   She is s/p 4 cycles of adjuvant carbo Doxil chemotherapy and this is a routine follow-up visit  Clinically patient is doing well with no concerning signs and symptoms of recurrence based on today's exam.  Abdominal exam is unremarkableAnd patient reports no suspicious signs and symptoms.  She does report some burning urination today for which we will check urinalysis.  Labs are otherwise unremarkable other than elevated blood sugar of 211.  CA125 remains within normal limits.  I will see her back in 3 months with labs and following that I will see her every 6 months alternating with GYN oncology.   Visit Diagnosis 1. Encounter for follow-up surveillance of ovarian cancer   2. High grade ovarian cancer [facility])      Dr. Randa Evens, MD, MPH [facility] [phone] 04/03/2022 8:48 PM

## 2022-04-12 ENCOUNTER — Telehealth: Payer: Self-pay

## 2022-04-12 NOTE — Telephone Encounter (Signed)
[employer]otox[employer] Benefit Verification [id number] Submitted!

## 2022-04-12 NOTE — Progress Notes (Signed)
Chronic Care Management Pharmacy Assistant   Name: Shannon Higgins  MRN: [id number] DOB: 04-Nov-1934  Reason for Encounter: CCM (General Adherence)  Recent office visits:  03/01/22 AWV  Recent consult visits:  03/28/22 Randa Evens, MD (Oncology) follow-up surveillance of ovarian cancer Start: amoxicillin-clavulanate (AUGMENTIN) 875-125 MG tablet due to burning urination and suggestive UTI. FU 3 months 03/18/22 Mammogram 03/14/22 Murilo Zanette, PA (Ortho) Tendinitis of right rotator cuff  No other info 03/02/22 Santiago Glad, MD (Obstetrics) follow-up surveillance of ovarian cancer Start: nystatin (MYCOSTATIN/NYSTOP) powder FU 6 months   Hospital visits:  None since last CCM contact  Medications: Outpatient Encounter Medications as of 04/12/2022  Medication Sig   Accu-Chek FastClix Lancets MISC USE AS INSTRUCTED TO TEST BLOOD SUGAR DAILY   ACCU-CHEK GUIDE test strip USE AS INSTRUCTED TO TEST BLOOD SUGAR DAILY   amoxicillin-clavulanate (AUGMENTIN) 875-125 MG tablet Take 1 tablet by mouth 2 (two) times daily.   aspirin EC 325 MG tablet Take 325 mg by mouth daily.   blood glucose meter kit and supplies Dispense based on patient and insurance preference. Use up to 2 times daily as directed. (FOR ICD-10 E10.9, E11.9).   botulinum toxin Type A (BOTOX) 200 units injection Inject 155 units IM into multiple sites of face head and neck every 90 days   Cranberry 1000 MG CAPS Take 1 capsule by mouth 2 (two) times daily.   donepezil (ARICEPT) 10 MG tablet Take 1 tablet (10 mg total) by mouth at bedtime.   dorzolamide-timolol (COSOPT) 22.3-6.8 MG/ML ophthalmic solution Place 1 drop into both eyes 2 (two) times daily.   DULoxetine (CYMBALTA) 60 MG capsule Take 1 capsule (60 mg total) by mouth daily. for anxiety and depression.   fluticasone (FLONASE) 50 MCG/ACT nasal spray USE 2 SPRAYS IN EACH NOSTRIL DAILY AS NEEDED FOR ALLERGIES OR RHINITIS   gabapentin (NEURONTIN) 800 MG tablet TAKE 1  TABLET BY MOUTH 3 TIMES A DAY FOR NEUROPATHY   latanoprost (XALATAN) 0.005 % ophthalmic solution Place 1 drop into both eyes at bedtime.   levothyroxine (SYNTHROID) 112 MCG tablet TAKE 1 TABLET EVERY MORNING MONDAY THROUGH SATURDAY AND 1/2 TABLET ON SUNDAY. TAKE ON AN EMPTY STOMACH WITH WATER ONLY   meclizine (ANTIVERT) 25 MG tablet TAKE 1 TABLET (25 MG TOTAL) BY MOUTH 2 (TWO) TIMES DAILY AS NEEDED FOR DIZZINESS. (Patient not taking: Reported on 03/28/2022)   meloxicam (MOBIC) 15 MG tablet TAKE 1 TABLET DAILY AS NEEDED FOR PAIN   memantine (NAMENDA) 5 MG tablet Take 1 tablet twice a day   Netarsudil Dimesylate (RHOPRESSA) 0.02 % SOLN Place 1 drop into both eyes daily at 6 (six) AM.   nystatin (MYCOSTATIN/NYSTOP) powder Apply 1 Application topically 2 (two) times daily. May increase application to three times a day. Once redness clears can then use as needed.   omeprazole (PRILOSEC) 20 MG capsule TAKE 1 CAPSULE TWICE DAILY FOR HEARTBURN.   pilocarpine (PILOCAR) 2 % ophthalmic solution Place 1 drop into both eyes 4 (four) times daily.    propranolol (INDERAL) 80 MG tablet Take 1 tablet (80 mg total) by mouth daily. For headache prevention.   rosuvastatin (CRESTOR) 10 MG tablet Take 1 tablet (10 mg total) by mouth daily. for cholesterol   SUMAtriptan (IMITREX) 25 MG tablet Take 1 tablet (25 mg total) by mouth every 2 (two) hours as needed for migraine. May repeat in 2 hours if headache persists or recurs. (Patient not taking: Reported on 03/28/2022)   traZODone (DESYREL) 100 MG  tablet TAKE 2 TABLETS AT BEDTIME AS NEEDED FOR SLEEP   No facility-administered encounter medications on file as of 04/12/2022.    Recent vitals BP Readings from Last 3 Encounters:  03/28/22 116/72  03/02/22 (!) 154/70  02/14/22 115/73   Pulse Readings from Last 3 Encounters:  03/28/22 60  03/02/22 73  02/14/22 (!) 58   Wt Readings from Last 3 Encounters:  03/28/22 176 lb 12.8 oz (80.2 kg)  03/02/22 165 lb 8 oz  (75.1 kg)  02/14/22 167 lb 15.9 oz (76.2 kg)   BMI Readings from Last 3 Encounters:  03/28/22 30.35 kg/m  03/02/22 28.41 kg/m  02/14/22 28.84 kg/m    Recent lab results    Component Value Date/Time   NA 135 03/28/2022 1425   K 4.0 03/28/2022 1425   CL 100 03/28/2022 1425   CO2 26 03/28/2022 1425   GLUCOSE 211 (H) 03/28/2022 1425   BUN 13 03/28/2022 1425   CREATININE 0.96 03/28/2022 1425   CALCIUM 8.9 03/28/2022 1425    Lab Results  Component Value Date   CREATININE 0.96 03/28/2022   GFR 48.71 (L) 01/27/2022   GFRNONAA 58 (L) 03/28/2022   GFRAA >60 01/20/2020   Lab Results  Component Value Date/Time   HGBA1C 6.4 (A) 07/28/2021 11:44 AM   HGBA1C 6.3 01/15/2021 12:50 PM   HGBA1C 6.8 (H) 07/14/2020 02:38 PM   MICROALBUR 12.8 (H) 12/29/2017 03:08 PM    Lab Results  Component Value Date   CHOL 175 07/28/2021   HDL 59.40 07/28/2021   LDLCALC 65 03/04/2019   LDLDIRECT 79.0 07/28/2021   TRIG 302.0 (H) 07/28/2021   CHOLHDL 3 07/28/2021   Contacted [name] on 04/15/2022 for general disease state and medication adherence call.  Spoke with patient's daughter [name]) for the encounter.   What concerns do you have about your medications? No concerns  The patient denies side effects with their medications.   How often do you forget or accidentally miss a dose? Never  Do you use a pillbox? Yes  Are you having any problems getting your medications from your pharmacy? No  Has the cost of your medications been a concern? No  Since last visit with CPP, the following interventions have been made. Start: amoxicillin-clavulanate (AUGMENTIN) 875-125 MG tablet due to burning urination and suggestive UTI. Start: nystatin (MYCOSTATIN/NYSTOP) powder FU 6 months  The patient has not had an ED visit since last contact.   The patient denies problems with their health.   Patient denies concerns or questions for Charlene Brooke, PharmD at this time.   Care Gaps: Annual  wellness visit in last year? Yes 03/01/22 Most Recent BP reading: 116/72 on 03/28/2022  If Diabetic: Last eye exam / retinopathy screening:09/18/2018 Last diabetic foot exam: Never completed Last UACR: Not complete  Star Rating Drugs:  Medication:  Last Fill: Day Supply Rosuvastatin 10 mg 01/28/22 90  Summary of recommendations from last [place] visit (Date:10/06/21)  Summary: CCM F/U visit -Reviewed medications; pt affirms compliance as prescribed -Pt has added levothyroxine to pill box (including 1/2 tab once a week) which has improved compliance   Recommendations/Changes made from today's visit: -No med changes   Plan: -[place] will call patient in 6 months for general update -Pharmacist follow up televisit scheduled for 1 year   Upcoming appointments: No appointments scheduled within the next 30 days.  Charlene Brooke, CPP notified  Marijean Niemann, [place] Clinical Pharmacy Assistant [phone]

## 2022-04-12 NOTE — Telephone Encounter (Signed)
Pharmacy Patient Advocate Encounter   Received notification that prior authorization for Botox 200UNIT solution is required/requested.    PA submitted on 04/12/2022  via CoverMyMeds Key [id number] Status is pending

## 2022-04-13 NOTE — Telephone Encounter (Signed)
Pharmacy Patient Advocate Encounter  Prior Authorization for Botox 200UNIT solution has been approved.    PA# [id number] Effective dates: 05/17/2019 through 05/02/2023.

## 2022-04-15 ENCOUNTER — Ambulatory Visit: Payer: Medicare HMO | Admitting: Neurology

## 2022-04-22 ENCOUNTER — Ambulatory Visit
Admission: RE | Admit: 2022-04-22 | Discharge: 2022-04-22 | Disposition: A | Discharge status: Home / Self Care | Payer: Medicare PPO | Source: Ambulatory Visit | Attending: Urgent Care | Admitting: Urgent Care

## 2022-04-22 VITALS — BP 147/87 | HR 78 | Temp 97.5°F | Resp 16

## 2022-04-22 DIAGNOSIS — R399 Unspecified symptoms and signs involving the genitourinary system: Secondary | ICD-10-CM | POA: Diagnosis not present

## 2022-04-22 LAB — POCT URINALYSIS DIP (MANUAL ENTRY)
Bilirubin, UA: NEGATIVE
Blood, UA: NEGATIVE
Glucose, UA: NEGATIVE mg/dL
Leukocytes, UA: NEGATIVE
Nitrite, UA: NEGATIVE
Protein Ur, POC: 100 mg/dL — AB
Spec Grav, UA: >=1.03 — AB (ref 1.010–1.025)
Urobilinogen, UA: 0.2 E.U./dL
pH, UA: 5 (ref 5.0–8.0)

## 2022-04-22 NOTE — ED Provider Notes (Signed)
[facility]    CSN: [id number] Arrival date & time: 04/22/22  0859      History   Chief Complaint Chief Complaint  Patient presents with   Urinary Frequency    Having symptoms of UTI like she's had in the past. Vaginal pain, feeling bad, and strong odor to urine. - Entered by patient    HPI Shannon Higgins is a 87 y.o. female.   HPI  Patient presents to urgent care with symptoms of UTI.  She endorses vaginal pain, strong odor to urine.  She states she "feels bad".  Patient states these are common symptoms of UTI for her.  She states she was recently treated for UTI with amoxicillin and wants to check if it is unresolved.  Her chart shows urinalysis on 03/28/2022 and treatment with Augmentin twice daily x 5 days.  PMH significant for DM 2.  Past Medical History:  Diagnosis Date   Acute metabolic encephalopathy 67/61/9509   Acute tubular injury of transplanted kidney ([place]) 06/14/2016   AKI (acute kidney injury) ([place]) 06/14/2016   Asthma    Breast cancer ([place]) 2015   left   Bronchitis    CAP (community acquired pneumonia) 04/29/2012   Chronic sinusitis    Closed fracture of medial malleolus 05/11/2018   Confusion 12/06/2016   Diabetes mellitus without complication (HCC)    Family history of breast cancer    GERD (gastroesophageal reflux disease)    History of ankle fracture 05/14/2018   Last Assessment & Plan:  With a recent fall. Right medial malleous. Has ortho follow up soon, splinted now and tylenol helps her pain   History of recurrent UTIs    Hypertension    Hypokalemia 04/26/2012   Hyponatremia    Hypothyroidism    Imbalance 09/02/2019   Insomnia    Migraine    Neuropathic pain    Ovarian cancer ([place]) 2022   Ovarian mass, left 07/14/2020   Personal history of breast cancer    Personal history of chemotherapy current   bilateral ovarian ca   Pneumonia    Rheumatoid arthritis ([place])    Sepsis ([place]) 04/26/2012   Sepsis secondary to UTI ([place]) 04/26/2012    Sleep apnea    Stroke [facility])    seen in CT scan   Thyroid disease    Transient hypotension 06/14/2016    Patient Active Problem List   Diagnosis Date Noted   Abnormal urine odor 01/27/2022   Other fatigue 01/27/2022   COVID-19 virus infection 12/17/2020   Immunocompromised ([place]) 12/17/2020   Personal history of breast cancer    Family history of breast cancer    High grade ovarian cancer ([place]) 07/21/2020   S/P bilateral oophorectomy 07/21/2020   Decreased mobility 09/02/2019   Preventative health care 03/04/2019   Insomnia 09/27/2018   History of TIA (transient ischemic attack) 09/13/2018   Recurrent UTI 06/20/2018   Frequent falls 05/14/2018   Gout 05/14/2018   Type 2 diabetes mellitus with peripheral neuropathy ([place]) 05/11/2018   Type 2 diabetes mellitus ([place]) 05/11/2018   Orthostatic hypotension 05/08/2018   History of breast cancer 10/17/2017   MDD (major depressive disorder), recurrent episode ([place]) 06/21/2017   Chronic low back pain 01/13/2017   Hyperlipidemia 12/06/2016   Glaucoma 12/06/2016   Neuropathic pain 12/06/2016   Malignant melanoma of skin ([place]) 12/06/2016   Dementia arising in the senium and presenium ([place]) 12/06/2016   Chronic headache disorder 12/06/2016   Hypothyroidism 06/14/2016   Anemia 04/26/2012  Hypertensive disorder 04/26/2012   Gastroesophageal reflux disease 04/26/2012   Obstructive sleep apnea syndrome 04/26/2012    Past Surgical History:  Procedure Laterality Date   ABDOMINAL HYSTERECTOMY     BACK SURGERY     BREAST BIOPSY Right ?`   benign   BREAST LUMPECTOMY Left 2015   breast ca   CHOLECYSTECTOMY     JOINT REPLACEMENT     LAPAROTOMY N/A 07/14/2020   Procedure: LAPAROTOMY;  Surgeon: Gae Dry, MD;  Location: [facility];  Service: Gynecology;  Laterality: N/A;   OOPHORECTOMY     OVARIAN CYST REMOVAL     PORTA CATH INSERTION N/A 07/27/2020   Procedure: PORTA CATH INSERTION;  Surgeon: Algernon Huxley, MD;  Location: [facility];  Service: Cardiovascular;  Laterality: N/A;   PORTA CATH REMOVAL N/A 04/05/2021   Procedure: PORTA CATH REMOVAL;  Surgeon: Algernon Huxley, MD;  Location: [facility];  Service: Cardiovascular;  Laterality: N/A;   REPLACEMENT TOTAL KNEE Right    SALPINGOOPHORECTOMY Bilateral 07/14/2020   Procedure: OPEN SALPINGO OOPHORECTOMY;  Surgeon: Gae Dry, MD;  Location: [facility];  Service: Gynecology;  Laterality: Bilateral;   TOTAL SHOULDER REPLACEMENT Left     OB History     Gravida  2   Para  2   Term  2   Preterm      AB      Living  2      SAB      IAB      Ectopic      Multiple      Live Births               Home Medications    Prior to Admission medications   Medication Sig Start Date End Date Taking? Authorizing Provider  Accu-Chek FastClix Lancets MISC USE AS INSTRUCTED TO TEST BLOOD SUGAR DAILY 10/14/20   Pleas Koch, NP  ACCU-CHEK GUIDE test strip USE AS INSTRUCTED TO TEST BLOOD SUGAR DAILY 10/14/20   Pleas Koch, NP  amoxicillin-clavulanate (AUGMENTIN) 875-125 MG tablet Take 1 tablet by mouth 2 (two) times daily. 03/28/22   Sindy Guadeloupe, MD  aspirin EC 325 MG tablet Take 325 mg by mouth daily.    [provider]  blood glucose meter kit and supplies Dispense based on patient and insurance preference. Use up to 2 times daily as directed. (FOR ICD-10 E10.9, E11.9). 03/04/19   Pleas Koch, NP  botulinum toxin Type A (BOTOX) 200 units injection Inject 155 units IM into multiple sites of face head and neck every 90 days 03/22/22   Pieter Partridge, DO  Cranberry 1000 MG CAPS Take 1 capsule by mouth 2 (two) times daily.    [provider]  donepezil (ARICEPT) 10 MG tablet Take 1 tablet (10 mg total) by mouth at bedtime. 02/08/22   Cameron Sprang, MD  dorzolamide-timolol (COSOPT) 22.3-6.8 MG/ML ophthalmic solution Place 1 drop into both eyes 2 (two) times daily.    [provider]  DULoxetine  (CYMBALTA) 60 MG capsule Take 1 capsule (60 mg total) by mouth daily. for anxiety and depression. 02/08/22   Pleas Koch, NP  fluticasone (FLONASE) 50 MCG/ACT nasal spray USE 2 SPRAYS IN EACH NOSTRIL DAILY AS NEEDED FOR ALLERGIES OR RHINITIS 07/24/21   Pleas Koch, NP  gabapentin (NEURONTIN) 800 MG tablet TAKE 1 TABLET BY MOUTH 3 TIMES A DAY FOR NEUROPATHY 02/09/22   Pleas Koch,  NP  latanoprost (XALATAN) 0.005 % ophthalmic solution Place 1 drop into both eyes at bedtime.    [provider]  levothyroxine (SYNTHROID) 112 MCG tablet TAKE 1 TABLET EVERY MORNING MONDAY THROUGH SATURDAY AND 1/2 TABLET ON SUNDAY. TAKE ON AN EMPTY STOMACH WITH WATER ONLY 10/03/21   Pleas Koch, NP  meclizine (ANTIVERT) 25 MG tablet TAKE 1 TABLET (25 MG TOTAL) BY MOUTH 2 (TWO) TIMES DAILY AS NEEDED FOR DIZZINESS. Patient not taking: Reported on 03/28/2022 05/14/20   Pleas Koch, NP  meloxicam (MOBIC) 15 MG tablet TAKE 1 TABLET DAILY AS NEEDED FOR PAIN 03/04/22   Pleas Koch, NP  memantine [facility]) 5 MG tablet Take 1 tablet twice a day 02/08/22   Cameron Sprang, MD  Netarsudil Dimesylate (RHOPRESSA) 0.02 % SOLN Place 1 drop into both eyes daily at 6 (six) AM. 12/04/18   [provider]  nystatin (MYCOSTATIN/NYSTOP) powder Apply 1 Application topically 2 (two) times daily. May increase application to three times a day. Once redness clears can then use as needed. 03/02/22   Secord, Venida Jarvis, MD  omeprazole (PRILOSEC) 20 MG capsule TAKE 1 CAPSULE TWICE DAILY FOR HEARTBURN. 07/19/21   Pleas Koch, NP  pilocarpine (PILOCAR) 2 % ophthalmic solution Place 1 drop into both eyes 4 (four) times daily.  02/19/19   [provider]  propranolol (INDERAL) 80 MG tablet Take 1 tablet (80 mg total) by mouth daily. For headache prevention. 12/03/20   Pleas Koch, NP  rosuvastatin (CRESTOR) 10 MG tablet Take 1 tablet (10 mg total) by mouth daily. for cholesterol  01/27/22   Bedsole, Amy E, MD  SUMAtriptan (IMITREX) 25 MG tablet Take 1 tablet (25 mg total) by mouth every 2 (two) hours as needed for migraine. May repeat in 2 hours if headache persists or recurs. Patient not taking: Reported on 03/28/2022 02/08/22   Cameron Sprang, MD  traZODone ([place]) 100 MG tablet TAKE 2 TABLETS AT BEDTIME AS NEEDED FOR SLEEP 07/19/21   Pleas Koch, NP    Family History Family History  Problem Relation Age of Onset   Diabetes Daughter    Hypertension Daughter    Fibromyalgia Daughter    GER disease Daughter    Fibromyalgia Daughter    Crohn's disease Daughter    Asthma Daughter    Hypertension Mother    Breast cancer Other    Breast cancer Niece    Uterine cancer Niece     Social History Social History   Tobacco Use   Smoking status: Never   Smokeless tobacco: Never  Vaping Use   Vaping Use: Never used  Substance Use Topics   Alcohol use: No   Drug use: No     Allergies   Patient has no known allergies.   Review of Systems Review of Systems   Physical Exam Triage Vital Signs ED Triage Vitals  Enc Vitals Group     BP      Pulse      Resp      Temp      Temp src      SpO2      Weight      Height      Head Circumference      Peak Flow      Pain Score      Pain Loc      Pain Edu?      Excl. in [place]?    No data found.  Updated Vital Signs There were no vitals taken for this visit.  Visual Acuity Right Eye Distance:   Left Eye Distance:   Bilateral Distance:    Right Eye Near:   Left Eye Near:    Bilateral Near:     Physical Exam   UC Treatments / Results  Labs (all labs ordered are listed, but only abnormal results are displayed) Labs Reviewed  POCT URINALYSIS DIP (MANUAL ENTRY)    EKG   Radiology No results found.  Procedures Procedures (including critical care time)  Medications Ordered in UC Medications - No data to display  Initial Impression / Assessment and Plan / UC Course  I have  reviewed the triage vital signs and the nursing notes.  Pertinent labs & imaging results that were available during my care of the patient were reviewed by me and considered in my medical decision making (see chart for details).   UA does not indicate any presence of leukocytes and by implication, no bacteria.  Communicated to patient that UTI is likely resolved.   Final Clinical Impressions(s) / UC Diagnoses   Final diagnoses:  None   Discharge Instructions   None    ED Prescriptions   None    PDMP not reviewed this encounter.   Rose Phi, [place] 04/22/22 [phone]

## 2022-04-22 NOTE — ED Triage Notes (Signed)
Pt. States she has a UTI 1 week ago and was treated with antibiotics. Pt. Wants to ensure the infection is gone and endorses no symptoms.

## 2022-04-22 NOTE — Discharge Instructions (Signed)
Follow up here or with your primary care provider if your symptoms are worsening or not improving.     

## 2022-05-04 ENCOUNTER — Ambulatory Visit
Admission: RE | Admit: 2022-05-04 | Discharge: 2022-05-04 | Disposition: A | Discharge status: Home / Self Care | Payer: Medicare PPO | Source: Ambulatory Visit | Attending: Emergency Medicine | Admitting: Emergency Medicine

## 2022-05-04 ENCOUNTER — Ambulatory Visit (INDEPENDENT_AMBULATORY_CARE_PROVIDER_SITE_OTHER): Payer: Medicare PPO

## 2022-05-04 VITALS — BP 114/72 | HR 71 | Temp 98.1°F | Resp 18 | Ht 64.0 in | Wt 170.0 lb

## 2022-05-04 DIAGNOSIS — R8290 Unspecified abnormal findings in urine: Secondary | ICD-10-CM

## 2022-05-04 DIAGNOSIS — J45901 Unspecified asthma with (acute) exacerbation: Secondary | ICD-10-CM

## 2022-05-04 DIAGNOSIS — J209 Acute bronchitis, unspecified: Secondary | ICD-10-CM

## 2022-05-04 DIAGNOSIS — R058 Other specified cough: Secondary | ICD-10-CM | POA: Diagnosis not present

## 2022-05-04 LAB — POCT URINALYSIS DIP (MANUAL ENTRY)
Blood, UA: NEGATIVE
Glucose, UA: NEGATIVE mg/dL
Nitrite, UA: NEGATIVE
Protein Ur, POC: 30 mg/dL — AB
Spec Grav, UA: 1.025 (ref 1.010–1.025)
Urobilinogen, UA: 0.2 E.U./dL
pH, UA: 5.5 (ref 5.0–8.0)

## 2022-05-04 MED ORDER — PREDNISONE 10 MG PO TABS: 40.0000 mg | ORAL_TABLET | Freq: Every day | ORAL | 0 refills | Status: AC

## 2022-05-04 MED ORDER — AZITHROMYCIN 250 MG PO TABS: 250.0000 mg | ORAL_TABLET | Freq: Every day | ORAL | 0 refills | Status: DC

## 2022-05-04 MED ORDER — ALBUTEROL SULFATE HFA 108 (90 BASE) MCG/ACT IN AERS: 1.0000 | INHALATION_SPRAY | Freq: Four times a day (QID) | RESPIRATORY_TRACT | 0 refills | Status: DC | PRN

## 2022-05-04 NOTE — Discharge Instructions (Addendum)
Take the prednisone and Zithromax as directed.  Use the albuterol inhaler as directed.  Follow up with your primary care provider.

## 2022-05-04 NOTE — ED Triage Notes (Addendum)
Patient to Urgent Care with complaints of nasal congestion and productive cough (grey-ish mucus) x8 days. Fever of around 100 at the beginning of illness, no recent fevers. Possible wheezing.   Has been taking robitussin, mucinex and nyquil.  Reports recently being treated for a UTI but is still having foul smelling urine.

## 2022-05-04 NOTE — ED Provider Notes (Signed)
[facility]    CSN: [id number] Arrival date & time: 05/04/22  1430      History   Chief Complaint Chief Complaint  Patient presents with   Cough    Sick x 1 week cough - Entered by patient    HPI Shannon Higgins is a 88 y.o. female.  Accompanied by her daughter, patient presents with chest congestion and productive cough x 8 days.  She reports low grade fever at the onset of her symptoms but none recently.  She has wheezing and mild shortness of breath.  Treatment at home with Robitussin, Mucinex, and Nyquil.  Patient also reports malodorous urine.  She denies chest pain, abdominal pain, dysuria, hematuria, or other symptoms.  Her medical history includes asthma, obstructive sleep apnea, diabetes, hypertension, stroke, GERD, breast cancer, dementia, chronic headache, chronic back pain, migraine headache, rheumatoid arthritis.  The history is provided by the patient, a relative and medical records.    Past Medical History:  Diagnosis Date   Acute metabolic encephalopathy 36/[id number]/[id number]   Acute tubular injury of transplanted kidney ([place]) 06/14/2016   AKI (acute kidney injury) ([place]) 06/14/2016   Asthma    Breast cancer ([place]) 2015   left   Bronchitis    CAP (community acquired pneumonia) 04/29/2012   Chronic sinusitis    Closed fracture of medial malleolus 05/11/2018   Confusion 12/06/2016   Diabetes mellitus without complication (HCC)    Family history of breast cancer    GERD (gastroesophageal reflux disease)    History of ankle fracture 05/14/2018   Last Assessment & Plan:  With a recent fall. Right medial malleous. Has ortho follow up soon, splinted now and tylenol helps her pain   History of recurrent UTIs    Hypertension    Hypokalemia 04/26/2012   Hyponatremia    Hypothyroidism    Imbalance 09/02/2019   Insomnia    Migraine    Neuropathic pain    Ovarian cancer ([place]) 2022   Ovarian mass, left 07/14/2020   Personal history of breast cancer    Personal history of  chemotherapy current   bilateral ovarian ca   Pneumonia    Rheumatoid arthritis ([place])    Sepsis ([place]) 04/26/2012   Sepsis secondary to UTI ([place]) 04/26/2012   Sleep apnea    Stroke [facility])    seen in CT scan   Thyroid disease    Transient hypotension 06/14/2016    Patient Active Problem List   Diagnosis Date Noted   Abnormal urine odor 01/27/2022   Other fatigue 01/27/2022   COVID-19 virus infection 12/17/2020   Immunocompromised ([place]) 12/17/2020   Personal history of breast cancer    Family history of breast cancer    High grade ovarian cancer ([place]) 07/21/2020   S/P bilateral oophorectomy 07/21/2020   Decreased mobility 09/02/2019   Preventative health care 03/04/2019   Insomnia 09/27/2018   History of TIA (transient ischemic attack) 09/13/2018   Recurrent UTI 06/20/2018   Frequent falls 05/14/2018   Gout 05/14/2018   Type 2 diabetes mellitus with peripheral neuropathy ([place]) 05/11/2018   Type 2 diabetes mellitus ([place]) 05/11/2018   Orthostatic hypotension 05/08/2018   History of breast cancer 10/17/2017   MDD (major depressive disorder), recurrent episode ([place]) 06/21/2017   Chronic low back pain 01/13/2017   Hyperlipidemia 12/06/2016   Glaucoma 12/06/2016   Neuropathic pain 12/06/2016   Malignant melanoma of skin ([place]) 12/06/2016   Dementia arising in the senium and presenium ([place]) 12/06/2016   Chronic  headache disorder 12/06/2016   Hypothyroidism 06/14/2016   Anemia 04/26/2012   Hypertensive disorder 04/26/2012   Gastroesophageal reflux disease 04/26/2012   Obstructive sleep apnea syndrome 04/26/2012    Past Surgical History:  Procedure Laterality Date   ABDOMINAL HYSTERECTOMY     BACK SURGERY     BREAST BIOPSY Right ?`   benign   BREAST LUMPECTOMY Left 2015   breast ca   CHOLECYSTECTOMY     JOINT REPLACEMENT     LAPAROTOMY N/A 07/14/2020   Procedure: LAPAROTOMY;  Surgeon: Gae Dry, MD;  Location: [facility];  Service: Gynecology;  Laterality: N/A;    OOPHORECTOMY     OVARIAN CYST REMOVAL     PORTA CATH INSERTION N/A 07/27/2020   Procedure: PORTA CATH INSERTION;  Surgeon: Algernon Huxley, MD;  Location: [place] [facility];  Service: Cardiovascular;  Laterality: N/A;   PORTA CATH REMOVAL N/A 04/05/2021   Procedure: PORTA CATH REMOVAL;  Surgeon: Algernon Huxley, MD;  Location: [place] [facility];  Service: Cardiovascular;  Laterality: N/A;   REPLACEMENT TOTAL KNEE Right    SALPINGOOPHORECTOMY Bilateral 07/14/2020   Procedure: OPEN SALPINGO OOPHORECTOMY;  Surgeon: Gae Dry, MD;  Location: [facility] OR[facility];  Service: Gynecology;  Laterality: Bilateral;   TOTAL SHOULDER REPLACEMENT Left     OB History     Gravida  2   Para  2   Term  2   Preterm      AB      Living  2      SAB      IAB      Ectopic      Multiple      Live Births               Home Medications    Prior to Admission medications   Medication Sig Start Date End Date Taking? Authorizing Provider  albuterol (VENTOLIN HFA) 108 (90 Base) MCG/ACT inhaler Inhale 1-2 puffs into the lungs every 6 (six) hours as needed. 05/04/22  Yes Sharion Balloon, NP  Accu-Chek FastClix Lancets MISC USE AS INSTRUCTED TO TEST BLOOD SUGAR DAILY 10/14/20   Pleas Koch, NP  ACCU-CHEK GUIDE test strip USE AS INSTRUCTED TO TEST BLOOD SUGAR DAILY 10/14/20   Pleas Koch, NP  aspirin EC 325 MG tablet Take 325 mg by mouth daily.    [provider]  azithromycin (ZITHROMAX) 250 MG tablet Take 1 tablet (250 mg total) by mouth daily. Take first 2 tablets together, then 1 every day until finished. 05/04/22  Yes Sharion Balloon, NP  blood glucose meter kit and supplies Dispense based on patient and insurance preference. Use up to 2 times daily as directed. (FOR ICD-10 E10.9, E11.9). 03/04/19   Pleas Koch, NP  botulinum toxin Type A (BOTOX) 200 units injection Inject 155 units IM into multiple sites of face head and neck every 90 days 03/22/22   Pieter Partridge, DO  Cranberry  1000 MG CAPS Take 1 capsule by mouth 2 (two) times daily.    [provider]  donepezil (ARICEPT) 10 MG tablet Take 1 tablet (10 mg total) by mouth at bedtime. 02/08/22   Cameron Sprang, MD  dorzolamide-timolol (COSOPT) 22.3-6.8 MG/ML ophthalmic solution Place 1 drop into both eyes 2 (two) times daily.    [provider]  DULoxetine (CYMBALTA) 60 MG capsule Take 1 capsule (60 mg total) by mouth daily. for anxiety and depression. 02/08/22   Alma Friendly  K, NP  fluticasone (FLONASE) 50 MCG/ACT nasal spray USE 2 SPRAYS IN EACH NOSTRIL DAILY AS NEEDED FOR ALLERGIES OR RHINITIS 07/24/21   Pleas Koch, NP  gabapentin (NEURONTIN) 800 MG tablet TAKE 1 TABLET BY MOUTH 3 TIMES A DAY FOR NEUROPATHY 02/09/22   Pleas Koch, NP  latanoprost (XALATAN) 0.005 % ophthalmic solution Place 1 drop into both eyes at bedtime.    [provider]  levothyroxine (SYNTHROID) 112 MCG tablet TAKE 1 TABLET EVERY MORNING MONDAY THROUGH SATURDAY AND 1/2 TABLET ON SUNDAY. TAKE ON AN EMPTY STOMACH WITH WATER ONLY 10/03/21   Pleas Koch, NP  meclizine (ANTIVERT) 25 MG tablet TAKE 1 TABLET (25 MG TOTAL) BY MOUTH 2 (TWO) TIMES DAILY AS NEEDED FOR DIZZINESS. Patient not taking: Reported on 03/28/2022 05/14/20   Pleas Koch, NP  meloxicam (MOBIC) 15 MG tablet TAKE 1 TABLET DAILY AS NEEDED FOR PAIN 03/04/22   Pleas Koch, NP  memantine [facility]) 5 MG tablet Take 1 tablet twice a day 02/08/22   Cameron Sprang, MD  Netarsudil Dimesylate (RHOPRESSA) 0.02 % SOLN Place 1 drop into both eyes daily at 6 (six) AM. 12/04/18   [provider]  nystatin (MYCOSTATIN/NYSTOP) powder Apply 1 Application topically 2 (two) times daily. May increase application to three times a day. Once redness clears can then use as needed. 03/02/22   Secord, Venida Jarvis, MD  omeprazole (PRILOSEC) 20 MG capsule TAKE 1 CAPSULE TWICE DAILY FOR HEARTBURN. 07/19/21   Pleas Koch, NP  pilocarpine  (PILOCAR) 2 % ophthalmic solution Place 1 drop into both eyes 4 (four) times daily.  02/19/19   [provider]  predniSONE (DELTASONE) 10 MG tablet Take 4 tablets (40 mg total) by mouth daily for 5 days. 05/04/22 05/09/22 Yes Sharion Balloon, NP  propranolol (INDERAL) 80 MG tablet Take 1 tablet (80 mg total) by mouth daily. For headache prevention. 12/03/20   Pleas Koch, NP  rosuvastatin (CRESTOR) 10 MG tablet Take 1 tablet (10 mg total) by mouth daily. for cholesterol 01/27/22   Bedsole, Amy E, MD  SUMAtriptan (IMITREX) 25 MG tablet Take 1 tablet (25 mg total) by mouth every 2 (two) hours as needed for migraine. May repeat in 2 hours if headache persists or recurs. Patient not taking: Reported on 03/28/2022 02/08/22   Cameron Sprang, MD  traZODone ([place]) 100 MG tablet TAKE 2 TABLETS AT BEDTIME AS NEEDED FOR SLEEP 07/19/21   Pleas Koch, NP    Family History Family History  Problem Relation Age of Onset   Diabetes Daughter    Hypertension Daughter    Fibromyalgia Daughter    GER disease Daughter    Fibromyalgia Daughter    Crohn's disease Daughter    Asthma Daughter    Hypertension Mother    Breast cancer Other    Breast cancer Niece    Uterine cancer Niece     Social History Social History   Tobacco Use   Smoking status: Never   Smokeless tobacco: Never  Vaping Use   Vaping Use: Never used  Substance Use Topics   Alcohol use: No   Drug use: No     Allergies   Patient has no known allergies.   Review of Systems Review of Systems  Constitutional:  Negative for chills and fever.  HENT:  Negative for ear pain and sore throat.   Respiratory:  Positive for cough, shortness of breath and wheezing.   Cardiovascular:  Negative  for chest pain and palpitations.  Gastrointestinal:  Negative for abdominal pain, nausea and vomiting.  Genitourinary:  Negative for dysuria, flank pain and hematuria.  Skin:  Negative for rash.  All other systems reviewed and are  negative.    Physical Exam Triage Vital Signs ED Triage Vitals  Enc Vitals Group     BP 05/04/22 1454 114/72     Pulse Rate 05/04/22 1449 71     Resp 05/04/22 1449 18     Temp 05/04/22 1449 98.1 F (36.7 C)     Temp src --      SpO2 05/04/22 1449 96 %     Weight 05/04/22 1451 170 lb (77.1 kg)     Height 05/04/22 1451 _0  (1.626 m)     Head Circumference --      Peak Flow --      Pain Score 05/04/22 1450 0     Pain Loc --      Pain Edu? --      Excl. in [place]? --    No data found.  Updated Vital Signs BP 114/72   Pulse 71   Temp 98.1 F (36.7 C)   Resp 18   Ht _1  (1.626 m)   Wt 170 lb (77.1 kg)   SpO2 96%   BMI 29.18 kg/m   Visual Acuity Right Eye Distance:   Left Eye Distance:   Bilateral Distance:    Right Eye Near:   Left Eye Near:    Bilateral Near:     Physical Exam Vitals and nursing note reviewed.  Constitutional:      General: She is not in acute distress.    Appearance: She is well-developed. She is not ill-appearing.  HENT:     Right Ear: Tympanic membrane normal.     Left Ear: Tympanic membrane normal.     Nose: Nose normal.     Mouth/Throat:     Mouth: Mucous membranes are moist.     Pharynx: Oropharynx is clear.  Cardiovascular:     Rate and Rhythm: Normal rate and regular rhythm.     Heart sounds: Normal heart sounds.  Pulmonary:     Effort: Pulmonary effort is normal. No respiratory distress.     Breath sounds: Wheezing present.     Comments: Faint scattered expiratory wheezes.  Abdominal:     General: Bowel sounds are normal.     Palpations: Abdomen is soft.     Tenderness: There is no abdominal tenderness. There is no right CVA tenderness, left CVA tenderness, guarding or rebound.  Musculoskeletal:     Cervical back: Neck supple.  Skin:    General: Skin is warm and dry.  Neurological:     Mental Status: She is alert.  Psychiatric:        Mood and Affect: Mood normal.        Behavior: Behavior normal.      UC  Treatments / Results  Labs (all labs ordered are listed, but only abnormal results are displayed) Labs Reviewed  POCT URINALYSIS DIP (MANUAL ENTRY) - Abnormal; Notable for the following components:      Result Value   Bilirubin, UA small (*)    Ketones, POC UA small (15) (*)    Protein Ur, POC =30 (*)    Leukocytes, UA Small (1+) (*)    All other components within normal limits    EKG   Radiology DG Chest 2 View  Result Date: 05/04/2022 CLINICAL DATA:  Productive cough. EXAM: CHEST - 2 VIEW COMPARISON:  Chest radiograph 04/23/2020 and CT 07/30/2020 FINDINGS: The cardiomediastinal silhouette is unchanged with normal heart size. Aortic atherosclerosis is noted. Chronic interstitial coarsening is similar to the prior radiograph. No acute airspace consolidation, edema, pleural effusion, or pneumothorax is identified. Left axillary surgical clips and bilateral shoulder arthroplasties are noted. IMPRESSION: No active cardiopulmonary disease. Electronically Signed   By: Logan Bores M.D.   On: 05/04/2022 15:21    Procedures Procedures (including critical care time)  Medications Ordered in UC Medications - No data to display  Initial Impression / Assessment and Plan / UC Course  I have reviewed the triage vital signs and the nursing notes.  Pertinent labs & imaging results that were available during my care of the patient were reviewed by me and considered in my medical decision making (see chart for details).    Productive cough, asthma exacerbation, acute bronchitis, malodorous urine.  CXR negative.  Patient has been symptomatic for 8 days.  Treating with Zithromax, albuterol inhaler, and prednisone.  Instructed patient to follow up with her PCP.  Education provided on asthma.  She agrees to plan of care.  She is accompanied by her daughter who also agrees to plan of care.    Final Clinical Impressions(s) / UC Diagnoses   Final diagnoses:  Productive cough  Asthma with acute  exacerbation, unspecified asthma severity, unspecified whether persistent  Acute bronchitis, unspecified organism  Malodorous urine     Discharge Instructions      Take the prednisone and Zithromax as directed.  Use the albuterol inhaler as directed.  Follow up with your primary care provider.        ED Prescriptions     Medication Sig Dispense Auth. Provider   predniSONE (DELTASONE) 10 MG tablet Take 4 tablets (40 mg total) by mouth daily for 5 days. 20 tablet Sharion Balloon, NP   azithromycin (ZITHROMAX) 250 MG tablet Take 1 tablet (250 mg total) by mouth daily. Take first 2 tablets together, then 1 every day until finished. 6 tablet Sharion Balloon, NP   albuterol (VENTOLIN HFA) 108 (90 Base) MCG/ACT inhaler Inhale 1-2 puffs into the lungs every 6 (six) hours as needed. 18 g Sharion Balloon, NP      PDMP not reviewed this encounter.   Sharion Balloon, NP 05/04/22 1540

## 2022-05-07 ENCOUNTER — Other Ambulatory Visit: Payer: Self-pay | Admitting: Primary Care

## 2022-05-07 ENCOUNTER — Other Ambulatory Visit: Payer: Self-pay | Admitting: Family Medicine

## 2022-05-07 DIAGNOSIS — K219 Gastro-esophageal reflux disease without esophagitis: Secondary | ICD-10-CM

## 2022-05-07 DIAGNOSIS — E785 Hyperlipidemia, unspecified: Secondary | ICD-10-CM

## 2022-05-07 DIAGNOSIS — G4700 Insomnia, unspecified: Secondary | ICD-10-CM

## 2022-05-07 DIAGNOSIS — G8929 Other chronic pain: Secondary | ICD-10-CM

## 2022-05-07 DIAGNOSIS — R42 Dizziness and giddiness: Secondary | ICD-10-CM

## 2022-05-07 DIAGNOSIS — F331 Major depressive disorder, recurrent, moderate: Secondary | ICD-10-CM

## 2022-05-08 NOTE — Telephone Encounter (Signed)
Patient is due for follow up, this will be required prior to any further refills.  Please schedule.

## 2022-05-09 NOTE — Telephone Encounter (Signed)
LVM for patient to call back and schedule

## 2022-05-13 ENCOUNTER — Encounter: Payer: Self-pay | Admitting: Family Medicine

## 2022-05-13 ENCOUNTER — Ambulatory Visit (INDEPENDENT_AMBULATORY_CARE_PROVIDER_SITE_OTHER): Payer: Medicare PPO | Admitting: Family Medicine

## 2022-05-13 ENCOUNTER — Ambulatory Visit: Payer: Medicare PPO | Admitting: Neurology

## 2022-05-13 VITALS — BP 110/70 | HR 86 | Temp 97.9°F | Ht 64.0 in | Wt 171.2 lb

## 2022-05-13 DIAGNOSIS — G43709 Chronic migraine without aura, not intractable, without status migrainosus: Secondary | ICD-10-CM | POA: Diagnosis not present

## 2022-05-13 DIAGNOSIS — J209 Acute bronchitis, unspecified: Secondary | ICD-10-CM

## 2022-05-13 DIAGNOSIS — G8929 Other chronic pain: Secondary | ICD-10-CM

## 2022-05-13 HISTORY — DX: Acute bronchitis, unspecified: J20.9

## 2022-05-13 MED ORDER — PROPRANOLOL HCL 80 MG PO TABS: 80.0000 mg | ORAL_TABLET | Freq: Every day | ORAL | 0 refills | Status: DC

## 2022-05-13 MED ORDER — AZITHROMYCIN 250 MG PO TABS: ORAL_TABLET | ORAL | 0 refills | Status: AC

## 2022-05-13 MED ORDER — ONABOTULINUMTOXINA 100 UNITS IJ SOLR
200.0000 [IU] | Freq: Once | INTRAMUSCULAR | Status: AC
  Administered 2022-05-13: 155 [IU] via INTRAMUSCULAR

## 2022-05-13 NOTE — Assessment & Plan Note (Signed)
Acute, improving She is to be improving daily.  No clear continued wheezing.  Daughter does note continued possibly purulent discharge. Encouraged her to continue with symptom care and rest.  If not continuing to turn the corner I have provided an additional antibiotic course for her to complete if needed.  Return and ER precautions provided.

## 2022-05-13 NOTE — Addendum Note (Signed)
Addended by: Venetia Night on: 05/13/2022 04:01 PM   Modules accepted: Orders

## 2022-05-13 NOTE — Progress Notes (Signed)
[facility]  ° °Procedure Note Botox ° °Attending: Dr. Addyson Traub ° °Preoperative Diagnosis(es): Chronic migraine ° °Consent obtained from: The patient °Benefits discussed included, but were not limited to decreased muscle tightness, increased joint range of motion, and decreased pain.  Risk discussed included, but were not limited pain and discomfort, bleeding, bruising, excessive weakness, venous thrombosis, muscle atrophy and dysphagia.  Anticipated outcomes of the procedure as well as he risks and benefits of the alternatives to the procedure, and the roles and tasks of the personnel to be involved, were discussed with the patient, and the patient consents to the procedure and agrees to proceed. A copy of the patient medication guide was given to the patient which explains the blackbox warning. ° °Patients identity and treatment sites confirmed Yes.  . ° °Details of Procedure: °Skin was cleaned with alcohol. Prior to injection, the needle plunger was aspirated to make sure the needle was not within a blood vessel.  There was no blood retrieved on aspiration.   ° °Following is a summary of the muscles injected  And the amount of Botulinum toxin used: ° °Dilution °200 units of Botox was reconstituted with 4 ml of preservative free normal saline. °Time of reconstitution: At the time of the office visit (<30 minutes prior to injection)  ° °Injections  °155 total units of Botox was injected with a 30 gauge needle. ° °Injection Sites: °L occipitalis: 15 units- 3 sites  °R occiptalis: 15 units- 3 sites ° °L upper trapezius: 15 units- 3 sites °R upper trapezius: 15 units- 3 sits          °L paraspinal: 10 units- 2 sites °R paraspinal: 10 units- 2 sites ° °Face °L frontalis(2 injection sites):10 units   °R frontalis(2 injection sites):10 units         °L corrugator: 5 units   °R corrugator: 5 units           °Procerus: 5 units   °L temporalis: 20 units °R temporalis: 20 units  ° °Agent:  °200 units of botulinum Type  A (Onobotulinum Toxin type A) was reconstituted with 4 ml of preservative free normal saline.  °Time of reconstitution: At the time of the office visit (<30 minutes prior to injection)  ° ° ° Total injected (Units):  155 ° Total wasted (Units):  45 ° °Patient tolerated procedure well without complications.   °Reinjection is anticipated in 3 months. ° ° °

## 2022-05-13 NOTE — Patient Instructions (Addendum)
Rest , fluids.  If not continuing to improve, repeat course of antibiotics.

## 2022-05-13 NOTE — Progress Notes (Signed)
Patient ID: Shannon Higgins, female    DOB: 1934-07-05, 88 y.o.   MRN: [id number]  This visit was conducted in person.  BP 110/70   Pulse 86   Temp 97.9 F (36.6 C) (Oral)   Ht '5\' 4"'$  (1.626 m)   Wt 171 lb 4 oz (77.7 kg)   SpO2 95%   BMI 29.39 kg/m    CC:  Chief Complaint  Patient presents with   Bronchitis    Follow up UC visit 05/04/22    Subjective:   HPI: Shannon Higgins is a 88 y.o. female patient of Pleas Koch, NP With history of diabetes, dementia wall and immunocompromised presenting on 05/13/2022 for Bronchitis (Follow up UC visit 05/04/22)  Reviewed urgent care note from May 04, 2022 Seen for productive cough and wheeze, diagnosed with acute bronchitis.  Felt asthma exacerbation possible Chest x-ray was negative. Treated with Zithromax, albuterol inhaler and prednisone taper.  She has noted some improvement in cough, but still present. Cough not keeping her up at night.  Thick green mucus productive.  No fever. No sinus pain, no ear pain.   No SOB at rest, mild with exertion.   Negative COVID test early on.   Relevant past medical, surgical, family and social history reviewed and updated as indicated. Interim medical history since our last visit reviewed. Allergies and medications reviewed and updated. Outpatient Medications Prior to Visit  Medication Sig Dispense Refill   Accu-Chek FastClix Lancets MISC USE AS INSTRUCTED TO TEST BLOOD SUGAR DAILY 102 each 1   ACCU-CHEK GUIDE test strip USE AS INSTRUCTED TO TEST BLOOD SUGAR DAILY 100 strip 1   albuterol (VENTOLIN HFA) 108 (90 Base) MCG/ACT inhaler Inhale 1-2 puffs into the lungs every 6 (six) hours as needed. 18 g 0   aspirin EC 325 MG tablet Take 325 mg by mouth daily.     blood glucose meter kit and supplies Dispense based on patient and insurance preference. Use up to 2 times daily as directed. (FOR ICD-10 E10.9, E11.9). 1 each 0   botulinum toxin Type A (BOTOX) 200 units injection Inject 155  units IM into multiple sites of face head and neck every 90 days 1 each 4   Cranberry 1000 MG CAPS Take 1 capsule by mouth 2 (two) times daily.     donepezil (ARICEPT) 10 MG tablet Take 1 tablet (10 mg total) by mouth at bedtime. 90 tablet 3   dorzolamide-timolol (COSOPT) 22.3-6.8 MG/ML ophthalmic solution Place 1 drop into both eyes 2 (two) times daily.     DULoxetine (CYMBALTA) 60 MG capsule TAKE 1 CAPSULE EVERY DAY FOR ANXIETY AND DEPRESSION. 90 capsule 0   fluticasone (FLONASE) 50 MCG/ACT nasal spray USE 2 SPRAYS IN EACH NOSTRIL DAILY AS NEEDED FOR ALLERGIES OR RHINITIS 48 g 0   gabapentin (NEURONTIN) 800 MG tablet TAKE 1 TABLET BY MOUTH 3 TIMES A DAY FOR NEUROPATHY 270 tablet 0   latanoprost (XALATAN) 0.005 % ophthalmic solution Place 1 drop into both eyes at bedtime.     levothyroxine (SYNTHROID) 112 MCG tablet TAKE 1 TABLET EVERY MORNING MONDAY THROUGH SATURDAY AND 1/2 TABLET ON SUNDAY. TAKE ON AN EMPTY STOMACH WITH WATER ONLY 78 tablet 2   meclizine (ANTIVERT) 25 MG tablet TAKE 1 TABLET (25 MG TOTAL) BY MOUTH 2 (TWO) TIMES DAILY AS NEEDED FOR DIZZINESS. 180 tablet 0   meloxicam (MOBIC) 15 MG tablet TAKE 1 TABLET DAILY AS NEEDED FOR PAIN 90 tablet 0   memantine (  NAMENDA) 5 MG tablet Take 1 tablet twice a day 180 tablet 3   Netarsudil Dimesylate (RHOPRESSA) 0.02 % SOLN Place 1 drop into both eyes daily at 6 (six) AM.     nystatin (MYCOSTATIN/NYSTOP) powder Apply 1 Application topically 2 (two) times daily. May increase application to three times a day. Once redness clears can then use as needed. 15 g 0   omeprazole (PRILOSEC) 20 MG capsule TAKE 1 CAPSULE TWICE DAILY FOR HEARTBURN. 180 capsule 0   pilocarpine (PILOCAR) 2 % ophthalmic solution Place 1 drop into both eyes 4 (four) times daily.      propranolol (INDERAL) 80 MG tablet Take 1 tablet (80 mg total) by mouth daily. For headache prevention. 90 tablet 1   rosuvastatin (CRESTOR) 10 MG tablet TAKE 1 TABLET EVERY DAY FOR CHOLESTEROL 90  tablet 0   SUMAtriptan (IMITREX) 25 MG tablet Take 1 tablet (25 mg total) by mouth every 2 (two) hours as needed for migraine. May repeat in 2 hours if headache persists or recurs. 10 tablet 11   traZODone (DESYREL) 100 MG tablet TAKE 2 TABLETS AT BEDTIME AS NEEDED FOR SLEEP 180 tablet 0   azithromycin (ZITHROMAX) 250 MG tablet Take 1 tablet (250 mg total) by mouth daily. Take first 2 tablets together, then 1 every day until finished. 6 tablet 0   No facility-administered medications prior to visit.     Per HPI unless specifically indicated in ROS section below Review of Systems  Constitutional:  Negative for fatigue and fever.  HENT:  Negative for congestion.   Eyes:  Negative for pain.  Respiratory:  Negative for cough and shortness of breath.   Cardiovascular:  Negative for chest pain, palpitations and leg swelling.  Gastrointestinal:  Negative for abdominal pain.  Genitourinary:  Negative for dysuria and vaginal bleeding.  Musculoskeletal:  Negative for back pain.  Neurological:  Negative for syncope, light-headedness and headaches.  Psychiatric/Behavioral:  Negative for dysphoric mood.    Objective:  BP 110/70   Pulse 86   Temp 97.9 F (36.6 C) (Oral)   Ht '5\' 4"'$  (1.626 m)   Wt 171 lb 4 oz (77.7 kg)   SpO2 95%   BMI 29.39 kg/m   Wt Readings from Last 3 Encounters:  05/13/22 171 lb 4 oz (77.7 kg)  05/04/22 170 lb (77.1 kg)  03/28/22 176 lb 12.8 oz (80.2 kg)      Physical Exam Constitutional:      General: She is not in acute distress.    Appearance: Normal appearance. She is well-developed. She is not ill-appearing or toxic-appearing.  HENT:     Head: Normocephalic.     Right Ear: Hearing, tympanic membrane, ear canal and external ear normal. Tympanic membrane is not erythematous, retracted or bulging.     Left Ear: Hearing, tympanic membrane, ear canal and external ear normal. Tympanic membrane is not erythematous, retracted or bulging.     Nose: No mucosal edema or  rhinorrhea.     Right Sinus: No maxillary sinus tenderness or frontal sinus tenderness.     Left Sinus: No maxillary sinus tenderness or frontal sinus tenderness.     Mouth/Throat:     Pharynx: Uvula midline.  Eyes:     General: Lids are normal. Lids are everted, no foreign bodies appreciated.     Conjunctiva/sclera: Conjunctivae normal.     Pupils: Pupils are equal, round, and reactive to light.  Neck:     Thyroid: No thyroid mass or thyromegaly.  Vascular: No carotid bruit.     Trachea: Trachea normal.  Cardiovascular:     Rate and Rhythm: Normal rate and regular rhythm.     Pulses: Normal pulses.     Heart sounds: Normal heart sounds, S1 normal and S2 normal. No murmur heard.    No friction rub. No gallop.  Pulmonary:     Effort: Pulmonary effort is normal. No tachypnea or respiratory distress.     Breath sounds: Normal breath sounds. No decreased breath sounds, wheezing, rhonchi or rales.  Abdominal:     General: Bowel sounds are normal.     Palpations: Abdomen is soft.     Tenderness: There is no abdominal tenderness.  Musculoskeletal:     Cervical back: Normal range of motion and neck supple.  Skin:    General: Skin is warm and dry.     Findings: No rash.  Neurological:     Mental Status: She is alert.  Psychiatric:        Mood and Affect: Mood is not anxious or depressed.        Speech: Speech normal.        Behavior: Behavior normal. Behavior is cooperative.        Thought Content: Thought content normal.        Judgment: Judgment normal.       Results for orders placed or performed during the hospital encounter of 05/04/22  POCT urinalysis dipstick  Result Value Ref Range   Color, UA yellow yellow   Clarity, UA clear clear   Glucose, UA negative negative mg/dL   Bilirubin, UA small (A) negative   Ketones, POC UA small (15) (A) negative mg/dL   Spec Grav, UA 1.025 1.010 - 1.025   Blood, UA negative negative   pH, UA 5.5 5.0 - 8.0   Protein Ur, POC =30  (A) negative mg/dL   Urobilinogen, UA 0.2 0.2 or 1.0 E.U./dL   Nitrite, UA Negative Negative   Leukocytes, UA Small (1+) (A) Negative    Assessment and Plan  Acute bronchitis, unspecified organism Assessment & Plan: Acute, improving She is to be improving daily.  No clear continued wheezing.  Daughter does note continued possibly purulent discharge. Encouraged her to continue with symptom care and rest.  If not continuing to turn the corner I have provided an additional antibiotic course for her to complete if needed.  Return and ER precautions provided.   Other orders -     Azithromycin; Take 2 tablets on day 1, then 1 tablet daily on days 2 through 5  Dispense: 6 tablet; Refill: 0    Return if symptoms worsen or fail to improve.   Eliezer Lofts, MD

## 2022-05-27 ENCOUNTER — Ambulatory Visit (INDEPENDENT_AMBULATORY_CARE_PROVIDER_SITE_OTHER): Payer: Medicare PPO | Admitting: Primary Care

## 2022-05-27 ENCOUNTER — Encounter: Payer: Self-pay | Admitting: Primary Care

## 2022-05-27 VITALS — BP 128/72 | HR 111 | Temp 97.2°F | Ht 64.0 in | Wt 169.0 lb

## 2022-05-27 DIAGNOSIS — G4733 Obstructive sleep apnea (adult) (pediatric): Secondary | ICD-10-CM

## 2022-05-27 DIAGNOSIS — I10 Essential (primary) hypertension: Secondary | ICD-10-CM

## 2022-05-27 DIAGNOSIS — M792 Neuralgia and neuritis, unspecified: Secondary | ICD-10-CM | POA: Diagnosis not present

## 2022-05-27 DIAGNOSIS — E1142 Type 2 diabetes mellitus with diabetic polyneuropathy: Secondary | ICD-10-CM | POA: Diagnosis not present

## 2022-05-27 DIAGNOSIS — E039 Hypothyroidism, unspecified: Secondary | ICD-10-CM

## 2022-05-27 DIAGNOSIS — G8929 Other chronic pain: Secondary | ICD-10-CM

## 2022-05-27 DIAGNOSIS — K219 Gastro-esophageal reflux disease without esophagitis: Secondary | ICD-10-CM

## 2022-05-27 DIAGNOSIS — M545 Low back pain, unspecified: Secondary | ICD-10-CM

## 2022-05-27 DIAGNOSIS — Z853 Personal history of malignant neoplasm of breast: Secondary | ICD-10-CM

## 2022-05-27 DIAGNOSIS — Z0000 Encounter for general adult medical examination without abnormal findings: Secondary | ICD-10-CM

## 2022-05-27 DIAGNOSIS — C569 Malignant neoplasm of unspecified ovary: Secondary | ICD-10-CM

## 2022-05-27 DIAGNOSIS — F0390 Unspecified dementia without behavioral disturbance: Secondary | ICD-10-CM

## 2022-05-27 DIAGNOSIS — R21 Rash and other nonspecific skin eruption: Secondary | ICD-10-CM

## 2022-05-27 DIAGNOSIS — E785 Hyperlipidemia, unspecified: Secondary | ICD-10-CM

## 2022-05-27 DIAGNOSIS — R519 Headache, unspecified: Secondary | ICD-10-CM

## 2022-05-27 DIAGNOSIS — G4700 Insomnia, unspecified: Secondary | ICD-10-CM

## 2022-05-27 DIAGNOSIS — M1A9XX0 Chronic gout, unspecified, without tophus (tophi): Secondary | ICD-10-CM

## 2022-05-27 DIAGNOSIS — F331 Major depressive disorder, recurrent, moderate: Secondary | ICD-10-CM

## 2022-05-27 MED ORDER — GABAPENTIN 600 MG PO TABS: 600.0000 mg | ORAL_TABLET | Freq: Three times a day (TID) | ORAL | 0 refills | Status: DC

## 2022-05-27 MED ORDER — NYSTATIN 100000 UNIT/GM EX CREA: 1.0000 | TOPICAL_CREAM | Freq: Two times a day (BID) | CUTANEOUS | 2 refills | Status: AC | PRN

## 2022-05-27 NOTE — Assessment & Plan Note (Signed)
Controlled. No side effects from Trazodone.  Continue Trazodone 200 mg HS

## 2022-05-27 NOTE — Assessment & Plan Note (Signed)
Controlled.  In an effort to reduce polypharmacy, will reduce gabapentin to 600 mg TID with a gradual wean down and perhaps off. Continue meloxicam 15 mg daily PRN.

## 2022-05-27 NOTE — Assessment & Plan Note (Signed)
Following with oncology, office notes reviewed from November 2023. Continue with conservative treatment.  We did review and discuss her CT chest from March 2022. She will bring this up with her oncologist at her upcoming visit.

## 2022-05-27 NOTE — Patient Instructions (Signed)
Stop by the lab prior to leaving today. I will notify you of your results once received.   We are reducing the dose of your gabapentin to 600 mg three times daily.  Use the Nystatin cream as needed for the rash.  It was a pleasure to see you today!

## 2022-05-27 NOTE — Assessment & Plan Note (Signed)
Controlled.  Continue propranolol ER 80 mg daily.

## 2022-05-27 NOTE — Assessment & Plan Note (Signed)
Repeat A1C pending.  Continue off medication. Urine microalbumin due and pending.   Follow up in 6 months.

## 2022-05-27 NOTE — Assessment & Plan Note (Signed)
Immunizations UTD per patient. Mammogram UTD.  Discussed the importance of a healthy diet and regular exercise in order for weight loss, and to reduce the risk of further co-morbidity.  Exam stable. Labs pending.  Follow up in 1 year for repeat physical.

## 2022-05-27 NOTE — Assessment & Plan Note (Signed)
Intolerable to CPAP machine, does not wish to pursue other options.

## 2022-05-27 NOTE — Assessment & Plan Note (Signed)
Following with neurology.  Continue Botox injections. Continue propranolol ER 80 mg daily and sumatriptan 25 mg PRN>

## 2022-05-27 NOTE — Assessment & Plan Note (Signed)
Controlled.  Continue omeprazole 20 mg BID.

## 2022-05-27 NOTE — Assessment & Plan Note (Signed)
Continue rosuvastatin 10 mg. Repeat lipid panel pending.

## 2022-05-27 NOTE — Assessment & Plan Note (Signed)
Overall compliant to levothyroxine. Reviewed TSH from September 2023. Continue levothyroxine 112 mcg.

## 2022-05-27 NOTE — Assessment & Plan Note (Signed)
Reviewed mammogram from November 2023. Following with oncology

## 2022-05-27 NOTE — Progress Notes (Signed)
Subjective:    Patient ID: Shannon Higgins, female    DOB: Sep 22, 1934, 88 y.o.   MRN: [id number]  HPI  Shannon Higgins is a very pleasant 88 y.o. female who presents today for complete physical and follow up of chronic conditions.  Her daughter joins [place] today who helps to provide information for HPI.  Immunizations: -Tetanus: Completed in 2020 -Influenza: Completed this season -Shingles: Completed Shingrix series -Pneumonia: Completed pneumovax in 2020  Diet: [place]. Exercise: No regular exercise.  Eye exam: Completes annually  Dental exam: Completes semi-annually   Mammogram: Completed in November 2023  Colonoscopy: Completed in 2012 Lung Cancer Screening: Completed CT chest in March 2022 which revealed "multiple randomly distributed... irregular pulmonary nodules".  Dexa: Completed in April 2021  BP Readings from Last 3 Encounters:  05/27/22 128/72  05/13/22 110/70  05/04/22 114/72         Review of Systems  Constitutional:  Negative for unexpected weight change.  HENT:  Negative for rhinorrhea.   Respiratory:  Negative for cough and shortness of breath.   Cardiovascular:  Negative for chest pain.  Gastrointestinal:  Negative for constipation and diarrhea.  Genitourinary:  Negative for difficulty urinating.  Musculoskeletal:  Positive for arthralgias. Negative for myalgias.  Skin:  Negative for rash.  Allergic/Immunologic: Negative for environmental allergies.  Neurological:  Positive for dizziness. Negative for numbness and headaches.  Psychiatric/Behavioral:  The patient is not nervous/anxious.          Past Medical History:  Diagnosis Date   Acute bronchitis 57/05/7791   Acute metabolic encephalopathy 90/30/0923   Acute tubular injury of transplanted kidney ([place]) 06/14/2016   AKI (acute kidney injury) ([place]) 06/14/2016   Asthma    Breast cancer ([place]) 2015   left   Bronchitis    CAP (community acquired pneumonia) 04/29/2012   Chronic  sinusitis    Closed fracture of medial malleolus 05/11/2018   Confusion 12/06/2016   COVID-19 virus infection 12/17/2020   Diabetes mellitus without complication (HCC)    Family history of breast cancer    GERD (gastroesophageal reflux disease)    History of ankle fracture 05/14/2018   Last Assessment & Plan:  With a recent fall. Right medial malleous. Has ortho follow up soon, splinted now and tylenol helps her pain   History of recurrent UTIs    Hypertension    Hypokalemia 04/26/2012   Hyponatremia    Hypothyroidism    Imbalance 09/02/2019   Insomnia    Migraine    Neuropathic pain    Ovarian cancer ([place]) 2022   Ovarian mass, left 07/14/2020   Personal history of breast cancer    Personal history of chemotherapy current   bilateral ovarian ca   Pneumonia    Rheumatoid arthritis ([place])    Sepsis ([place]) 04/26/2012   Sepsis secondary to UTI ([place]) 04/26/2012   Sleep apnea    Stroke [facility])    seen in CT scan   Thyroid disease    Transient hypotension 06/14/2016    Social History   Socioeconomic History   Marital status: Widowed    Spouse name: Not on file   Number of children: 2   Years of education: Not on file   Highest education level: Not on file  Occupational History   Occupation: retired   Tobacco Use   Smoking status: Never   Smokeless tobacco: Never  Vaping Use   Vaping Use: Never used  Substance and Sexual Activity   Alcohol use: No  Drug use: No   Sexual activity: Not Currently  Other Topics Concern   Not on file  Social History Narrative   Pt lives in 1 story home with her daughter, [name] and [name]'s husband   Has 2 adult daughters   Highest level of education: some college   Worked mainly as Web designer.   Social Determinants of Health   Financial Resource Strain: Low Risk  (03/01/2022)   Overall Financial Resource Strain (CARDIA)    Difficulty of Paying Living Expenses: Not hard at all  Food Insecurity: No Food Insecurity (03/01/2022)    Hunger Vital Sign    Worried About Running Out of Food in the Last Year: Never true    Ran Out of Food in the Last Year: Never true  Transportation Needs: No Transportation Needs (03/01/2022)   PRAPARE - Hydrologist (Medical): No    Lack of Transportation (Non-Medical): No  Physical Activity: Inactive (03/01/2022)   Exercise Vital Sign    Days of Exercise per Week: 0 days    Minutes of Exercise per Session: 0 min  Stress: No Stress Concern Present (03/01/2022)   [place]    Feeling of Stress : Not at all  Social Connections: Socially Isolated (03/01/2022)   Social Connection and Isolation Panel [NHANES]    Frequency of Communication with Friends and Family: Twice a week    Frequency of Social Gatherings with Friends and Family: Never    Attends Religious Services: 1 to 4 times per year    Active Member of [employer] or Organizations: No    Attends Archivist Meetings: Never    Marital Status: Widowed  Intimate Partner Violence: Not At Risk (03/01/2022)   Humiliation, Afraid, Rape, and Kick questionnaire    Fear of Current or Ex-Partner: No    Emotionally Abused: No    Physically Abused: No    Sexually Abused: No    Past Surgical History:  Procedure Laterality Date   ABDOMINAL HYSTERECTOMY     BACK SURGERY     BREAST BIOPSY Right ?`   benign   BREAST LUMPECTOMY Left 2015   breast ca   CHOLECYSTECTOMY     JOINT REPLACEMENT     LAPAROTOMY N/A 07/14/2020   Procedure: LAPAROTOMY;  Surgeon: Gae Dry, MD;  Location: [facility] ORS;  Service: Gynecology;  Laterality: N/A;   OOPHORECTOMY     OVARIAN CYST REMOVAL     PORTA CATH INSERTION N/A 07/27/2020   Procedure: PORTA CATH INSERTION;  Surgeon: Algernon Huxley, MD;  Location: [facility];  Service: Cardiovascular;  Laterality: N/A;   PORTA CATH REMOVAL N/A 04/05/2021   Procedure: PORTA CATH REMOVAL;  Surgeon: Algernon Huxley, MD;  Location: [place] [facility];  Service: Cardiovascular;  Laterality: N/A;   REPLACEMENT TOTAL KNEE Right    SALPINGOOPHORECTOMY Bilateral 07/14/2020   Procedure: OPEN SALPINGO OOPHORECTOMY;  Surgeon: Gae Dry, MD;  Location: [facility] ORS;  Service: Gynecology;  Laterality: Bilateral;   TOTAL SHOULDER REPLACEMENT Left     Family History  Problem Relation Age of Onset   Diabetes Daughter    Hypertension Daughter    Fibromyalgia Daughter    GER disease Daughter    Fibromyalgia Daughter    Crohn's disease Daughter    Asthma Daughter    Hypertension Mother    Breast cancer Other    Breast cancer Niece    Uterine cancer  Niece     No Known Allergies  Current Outpatient Medications on File Prior to Visit  Medication Sig Dispense Refill   Accu-Chek FastClix Lancets MISC USE AS INSTRUCTED TO TEST BLOOD SUGAR DAILY 102 each 1   ACCU-CHEK GUIDE test strip USE AS INSTRUCTED TO TEST BLOOD SUGAR DAILY 100 strip 1   albuterol (VENTOLIN HFA) 108 (90 Base) MCG/ACT inhaler Inhale 1-2 puffs into the lungs every 6 (six) hours as needed. 18 g 0   aspirin EC 325 MG tablet Take 325 mg by mouth daily.     blood glucose meter kit and supplies Dispense based on patient and insurance preference. Use up to 2 times daily as directed. (FOR ICD-10 E10.9, E11.9). 1 each 0   botulinum toxin Type A (BOTOX) 200 units injection Inject 155 units IM into multiple sites of face head and neck every 90 days 1 each 4   Cranberry 1000 MG CAPS Take 1 capsule by mouth 2 (two) times daily.     donepezil (ARICEPT) 10 MG tablet Take 1 tablet (10 mg total) by mouth at bedtime. 90 tablet 3   dorzolamide-timolol (COSOPT) 22.3-6.8 MG/ML ophthalmic solution Place 1 drop into both eyes 2 (two) times daily.     DULoxetine (CYMBALTA) 60 MG capsule TAKE 1 CAPSULE EVERY DAY FOR ANXIETY AND DEPRESSION. 90 capsule 0   fluticasone (FLONASE) 50 MCG/ACT nasal spray USE 2 SPRAYS IN EACH NOSTRIL DAILY AS NEEDED FOR ALLERGIES OR  RHINITIS 48 g 0   latanoprost (XALATAN) 0.005 % ophthalmic solution Place 1 drop into both eyes at bedtime.     levothyroxine (SYNTHROID) 112 MCG tablet TAKE 1 TABLET EVERY MORNING MONDAY THROUGH SATURDAY AND 1/2 TABLET ON SUNDAY. TAKE ON AN EMPTY STOMACH WITH WATER ONLY 78 tablet 2   meclizine (ANTIVERT) 25 MG tablet TAKE 1 TABLET (25 MG TOTAL) BY MOUTH 2 (TWO) TIMES DAILY AS NEEDED FOR DIZZINESS. 180 tablet 0   meloxicam (MOBIC) 15 MG tablet TAKE 1 TABLET DAILY AS NEEDED FOR PAIN 90 tablet 0   memantine (NAMENDA) 5 MG tablet Take 1 tablet twice a day 180 tablet 3   Netarsudil Dimesylate (RHOPRESSA) 0.02 % SOLN Place 1 drop into both eyes daily at 6 (six) AM.     omeprazole (PRILOSEC) 20 MG capsule TAKE 1 CAPSULE TWICE DAILY FOR HEARTBURN. 180 capsule 0   pilocarpine (PILOCAR) 2 % ophthalmic solution Place 1 drop into both eyes 4 (four) times daily.      propranolol (INDERAL) 80 MG tablet Take 1 tablet (80 mg total) by mouth daily. For headache prevention. 90 tablet 0   rosuvastatin (CRESTOR) 10 MG tablet TAKE 1 TABLET EVERY DAY FOR CHOLESTEROL 90 tablet 0   SUMAtriptan (IMITREX) 25 MG tablet Take 1 tablet (25 mg total) by mouth every 2 (two) hours as needed for migraine. May repeat in 2 hours if headache persists or recurs. 10 tablet 11   traZODone (DESYREL) 100 MG tablet TAKE 2 TABLETS AT BEDTIME AS NEEDED FOR SLEEP 180 tablet 0   No current facility-administered medications on file prior to visit.    BP 128/72   Pulse (!) 111   Temp (!) 97.2 F (36.2 C) (Temporal)   Ht '5\' 4"'$  (1.626 m)   Wt 169 lb (76.7 kg)   SpO2 96%   BMI 29.01 kg/m  Objective:   Physical Exam HENT:     Right Ear: Tympanic membrane and ear canal normal.     Left Ear: Tympanic membrane and ear canal  normal.     Nose: Nose normal.  Eyes:     Conjunctiva/sclera: Conjunctivae normal.     Pupils: Pupils are equal, round, and reactive to light.  Neck:     Thyroid: No thyromegaly.  Cardiovascular:     Rate and  Rhythm: Normal rate and regular rhythm.     Heart sounds: No murmur heard. Pulmonary:     Effort: Pulmonary effort is normal.     Breath sounds: Normal breath sounds. No rales.  Abdominal:     General: Bowel sounds are normal.     Palpations: Abdomen is soft.     Tenderness: There is no abdominal tenderness.  Musculoskeletal:     Cervical back: Neck supple.     Comments: Ambulates well with walker  Lymphadenopathy:     Cervical: No cervical adenopathy.  Skin:    General: Skin is warm and dry.     Findings: No rash.  Neurological:     Mental Status: She is alert and oriented to person, place, and time.     Cranial Nerves: No cranial nerve deficit.     Deep Tendon Reflexes: Reflexes are normal and symmetric.  Psychiatric:        Mood and Affect: Mood normal.           Assessment & Plan:  Preventative health care Assessment & Plan: Immunizations UTD per patient. Mammogram UTD.  Discussed the importance of a healthy diet and regular exercise in order for weight loss, and to reduce the risk of further co-morbidity.  Exam stable. Labs pending.  Follow up in 1 year for repeat physical.    Hypertension, unspecified type Assessment & Plan: Controlled.  Continue propranolol ER 80 mg daily.    Type 2 diabetes mellitus with peripheral neuropathy (HCC) Assessment & Plan: Repeat A1C pending.  Continue off medication. Urine microalbumin due and pending.   Follow up in 6 months.   Orders: -     Hemoglobin A1c -     Microalbumin / creatinine urine ratio  Neuropathic pain Assessment & Plan: Controlled.  Reducing gabapentin to 600 mg TID. Consider further reduction to 300 mg TID and then wean off. Patient and daughter agree.  Orders: -     Gabapentin; Take 1 tablet (600 mg total) by mouth 3 (three) times daily. For neuropathy  Dispense: 270 tablet; Refill: 0  Hyperlipidemia, unspecified hyperlipidemia type Assessment & Plan: Continue rosuvastatin 10  mg. Repeat lipid panel pending.  Orders: -     Lipid panel  Obstructive sleep apnea syndrome Assessment & Plan: Intolerable to CPAP machine, does not wish to pursue other options.   Gastroesophageal reflux disease, unspecified whether esophagitis present Assessment & Plan: Controlled.  Continue omeprazole 20 mg BID.   High grade ovarian cancer [facility]) Assessment & Plan: Following with oncology, office notes reviewed from November 2023. Continue with conservative treatment.  We did review and discuss her CT chest from March 2022. She will bring this up with her oncologist at her upcoming visit.   Hypothyroidism, unspecified type Assessment & Plan: Overall compliant to levothyroxine. Reviewed TSH from September 2023. Continue levothyroxine 112 mcg.   Dementia arising in the senium and presenium [facility]) Assessment & Plan: Following with neurology.  Continue Aricept 10 mg daily and Namenda 5 mg BID.   Chronic nonintractable headache, unspecified headache type Assessment & Plan: Following with neurology.  Continue Botox injections. Continue propranolol ER 80 mg daily and sumatriptan 25 mg PRN>   Chronic low back pain, unspecified  back pain laterality, unspecified whether sciatica present Assessment & Plan: Controlled.  In an effort to reduce polypharmacy, will reduce gabapentin to 600 mg TID with a gradual wean down and perhaps off. Continue meloxicam 15 mg daily PRN.   Chronic gout without tophus, unspecified cause, unspecified site -     Uric acid  Insomnia, unspecified type Assessment & Plan: Controlled. No side effects from Trazodone.  Continue Trazodone 200 mg HS   Moderate episode of recurrent major depressive disorder ([place]) Assessment & Plan: Controlled.  Continue duloxetine 60 mg daily.   Personal history of breast cancer Assessment & Plan: Reviewed mammogram from November 2023. Following with oncology   Rash and nonspecific skin  eruption -     Nystatin; Apply 1 Application topically 2 (two) times daily as needed.  Dispense: 30 g; Refill: 2        Pleas Koch, NP

## 2022-05-27 NOTE — Assessment & Plan Note (Signed)
Controlled.  Continue duloxetine 60 mg daily.

## 2022-05-27 NOTE — Assessment & Plan Note (Signed)
Controlled.  Reducing gabapentin to 600 mg TID. Consider further reduction to 300 mg TID and then wean off. Patient and daughter agree.

## 2022-05-27 NOTE — Assessment & Plan Note (Signed)
Following with neurology.  Continue Aricept 10 mg daily and Namenda 5 mg BID.

## 2022-05-28 LAB — MICROALBUMIN / CREATININE URINE RATIO
Creatinine, Urine: 227 mg/dL (ref 20–275)
Microalb Creat Ratio: 65 mcg/mg creat — ABNORMAL HIGH (ref <30)
Microalb, Ur: 14.8 mg/dL

## 2022-05-28 LAB — LIPID PANEL
Cholesterol: 185 mg/dL (ref <200)
HDL: 58 mg/dL (ref >=50)
LDL Cholesterol (Calc): 88 mg/dL (calc)
Non-HDL Cholesterol (Calc): 127 mg/dL (calc) (ref <130)
Total CHOL/HDL Ratio: 3.2 (calc) (ref <5.0)
Triglycerides: 324 mg/dL — ABNORMAL HIGH (ref <150)

## 2022-05-28 LAB — HEMOGLOBIN A1C
Hgb A1c MFr Bld: 7.9 % of total Hgb — ABNORMAL HIGH (ref <5.7)
Mean Plasma Glucose: 180 mg/dL
eAG (mmol/L): 10 mmol/L

## 2022-05-28 LAB — URIC ACID: Uric Acid, Serum: 7.1 mg/dL — ABNORMAL HIGH (ref 2.5–7.0)

## 2022-06-09 ENCOUNTER — Other Ambulatory Visit: Payer: Self-pay | Admitting: Primary Care

## 2022-06-09 DIAGNOSIS — G8929 Other chronic pain: Secondary | ICD-10-CM

## 2022-06-16 ENCOUNTER — Telehealth: Payer: Self-pay

## 2022-06-16 NOTE — Telephone Encounter (Signed)
Completed and placed on Kate's desk 

## 2022-06-16 NOTE — Telephone Encounter (Signed)
Completed form has been faxed to fax# [phone].  Received fax confirmation.  Copies made for patient, scanning,and myself.  Patient's copy placed in outgoing mail, per request.  Notified patient this is complete via mychart.

## 2022-06-16 NOTE — Telephone Encounter (Signed)
We received an FMLA form for Shannon Higgins from Shannon daughter, Shannon Hail', employer.  This is a continuation of Shannon previous FMLA form.  The last one expired on 04/16/22.  This form should be backdated to 04/17/22 to end 04/17/23.  As of right now, Shannon Higgins wants the form to stay the same with intermittent leave, but this may have to change to continuous leave in the future.  Shannon Higgins said Shannon Higgins is getting worse, and may require continuous care soon.  If this happens she will contact you.  Form placed in your inbox for completion and signing.  When form is complete, mail copy to home address.

## 2022-06-17 ENCOUNTER — Telehealth: Payer: Self-pay

## 2022-06-17 NOTE — Telephone Encounter (Signed)
Per [facility] needed for Botox 200 units. Key [id number]. Pharmacy benefit not buy and bill

## 2022-07-01 ENCOUNTER — Telehealth: Payer: Self-pay | Admitting: Pharmacy Technician

## 2022-07-01 ENCOUNTER — Other Ambulatory Visit (HOSPITAL_COMMUNITY): Payer: Self-pay

## 2022-07-06 ENCOUNTER — Inpatient Hospital Stay: Payer: Medicare PPO

## 2022-07-06 ENCOUNTER — Inpatient Hospital Stay: Payer: Medicare PPO | Admitting: Medical Oncology

## 2022-07-11 ENCOUNTER — Ambulatory Visit: Payer: Medicare PPO | Admitting: Nurse Practitioner

## 2022-07-11 ENCOUNTER — Inpatient Hospital Stay: Payer: Medicare PPO | Admitting: Medical Oncology

## 2022-07-11 ENCOUNTER — Inpatient Hospital Stay: Payer: Medicare PPO

## 2022-07-15 NOTE — Telephone Encounter (Signed)
Received notification from [facility] regarding a prior authorization for  Botox . Authorization has been APPROVED from 05/02/22 to 05/02/23.   Authorization # Key: [id number] - [place] Case ID: [id number]

## 2022-07-18 ENCOUNTER — Other Ambulatory Visit: Payer: Self-pay | Admitting: Primary Care

## 2022-07-18 DIAGNOSIS — E039 Hypothyroidism, unspecified: Secondary | ICD-10-CM

## 2022-07-20 ENCOUNTER — Encounter: Payer: Self-pay | Admitting: Medical Oncology

## 2022-07-20 ENCOUNTER — Inpatient Hospital Stay: Payer: Medicare PPO | Attending: Obstetrics and Gynecology

## 2022-07-20 ENCOUNTER — Inpatient Hospital Stay (HOSPITAL_BASED_OUTPATIENT_CLINIC_OR_DEPARTMENT_OTHER): Payer: Medicare PPO | Admitting: Medical Oncology

## 2022-07-20 VITALS — BP 136/65 | HR 63 | Temp 99.0°F | Resp 16 | Ht 64.0 in | Wt 165.0 lb

## 2022-07-20 DIAGNOSIS — Z853 Personal history of malignant neoplasm of breast: Secondary | ICD-10-CM | POA: Insufficient documentation

## 2022-07-20 DIAGNOSIS — Z08 Encounter for follow-up examination after completed treatment for malignant neoplasm: Secondary | ICD-10-CM | POA: Diagnosis not present

## 2022-07-20 DIAGNOSIS — Z940 Kidney transplant status: Secondary | ICD-10-CM | POA: Insufficient documentation

## 2022-07-20 DIAGNOSIS — C569 Malignant neoplasm of unspecified ovary: Secondary | ICD-10-CM | POA: Diagnosis present

## 2022-07-20 DIAGNOSIS — Z8543 Personal history of malignant neoplasm of ovary: Secondary | ICD-10-CM | POA: Diagnosis not present

## 2022-07-20 LAB — CBC WITH DIFFERENTIAL/PLATELET
Abs Immature Granulocytes: 0.04 10*3/uL (ref 0.00–0.07)
Basophils Absolute: 0.1 10*3/uL (ref 0.0–0.1)
Basophils Relative: 1 %
Eosinophils Absolute: 0.2 10*3/uL (ref 0.0–0.5)
Eosinophils Relative: 2 %
HCT: 42 % (ref 36.0–46.0)
Hemoglobin: 13.6 g/dL (ref 12.0–15.0)
Immature Granulocytes: 0 %
Lymphocytes Relative: 30 %
Lymphs Abs: 2.7 10*3/uL (ref 0.7–4.0)
MCH: 31.5 pg (ref 26.0–34.0)
MCHC: 32.4 g/dL (ref 30.0–36.0)
MCV: 97.2 fL (ref 80.0–100.0)
Monocytes Absolute: 0.8 10*3/uL (ref 0.1–1.0)
Monocytes Relative: 9 %
Neutro Abs: 5.4 10*3/uL (ref 1.7–7.7)
Neutrophils Relative %: 58 %
Platelets: 258 10*3/uL (ref 150–400)
RBC: 4.32 MIL/uL (ref 3.87–5.11)
RDW: 13.2 % (ref 11.5–15.5)
Smear Review: NORMAL
WBC: 9.2 10*3/uL (ref 4.0–10.5)
nRBC: 0 % (ref 0.0–0.2)

## 2022-07-20 LAB — COMPREHENSIVE METABOLIC PANEL
ALT: 29 U/L (ref 0–44)
AST: 44 U/L — ABNORMAL HIGH (ref 15–41)
Albumin: 4.1 g/dL (ref 3.5–5.0)
Alkaline Phosphatase: 74 U/L (ref 38–126)
Anion gap: 9 (ref 5–15)
BUN: 15 mg/dL (ref 8–23)
CO2: 25 mmol/L (ref 22–32)
Calcium: 9 mg/dL (ref 8.9–10.3)
Chloride: 101 mmol/L (ref 98–111)
Creatinine, Ser: 0.96 mg/dL (ref 0.44–1.00)
GFR, Estimated: 58 mL/min — ABNORMAL LOW (ref >60)
Glucose, Bld: 171 mg/dL — ABNORMAL HIGH (ref 70–99)
Potassium: 4 mmol/L (ref 3.5–5.1)
Sodium: 135 mmol/L (ref 135–145)
Total Bilirubin: 0.6 mg/dL (ref 0.3–1.2)
Total Protein: 7.5 g/dL (ref 6.5–8.1)

## 2022-07-20 NOTE — Progress Notes (Signed)
Hematology and Oncology Follow Up Visit  Shannon Higgins [id number] 1934-07-05 88 y.o. 07/20/2022  Past Medical History:  Diagnosis Date   Acute bronchitis [id number]   Acute metabolic encephalopathy [id number]   Acute tubular injury of transplanted kidney ([place]) 06/14/2016   AKI (acute kidney injury) ([place]) 06/14/2016   Asthma    Breast cancer ([place]) 2015   left   Bronchitis    CAP (community acquired pneumonia) 04/29/2012   Chronic sinusitis    Closed fracture of medial malleolus 05/11/2018   Confusion 12/06/2016   COVID-19 virus infection 12/17/2020   Diabetes mellitus without complication (HCC)    Family history of breast cancer    GERD (gastroesophageal reflux disease)    History of ankle fracture 05/14/2018   Last Assessment & Plan:  With a recent fall. Right medial malleous. Has ortho follow up soon, splinted now and tylenol helps her pain   History of recurrent UTIs    Hypertension    Hypokalemia 04/26/2012   Hyponatremia    Hypothyroidism    Imbalance 09/02/2019   Insomnia    Migraine    Neuropathic pain    Ovarian cancer ([place]) 2022   Ovarian mass, left 07/14/2020   Personal history of breast cancer    Personal history of chemotherapy current   bilateral ovarian ca   Pneumonia    Rheumatoid arthritis ([place])    Sepsis ([place]) 04/26/2012   Sepsis secondary to UTI ([place]) 04/26/2012   Sleep apnea    Stroke [facility])    seen in CT scan   Thyroid disease    Transient hypotension 06/14/2016    Principle Diagnosis:  high-grade serous carcinoma of the ovary FIGO stage IC pT1 cpNX cMX s/p bilateral salpingo-oophorectomy   Current Therapy:   Surveillance   PriorTherapy:   Carbo Doxil s/p 4 cycles- d/c due to intolerance.   Oncological Surgical History: S/p bilateral salpingo-oophorectomy       Interim History:  Shannon Higgins is back for follow-up for surveillance of ovarian cancer.   She presents today with her daughter. They report that she has been doing really well.  She has not noticed any abdominal/pelvic pain, changes in bowel or urination, unintentional weight loss, night sweats, new back pain.   She has follow up with GYN next month she believes.      Wt Readings from Last 3 Encounters:  07/20/22 165 lb (74.8 kg)  05/27/22 169 lb (76.7 kg)  05/13/22 171 lb 4 oz (77.7 kg)     Medications:   Current Outpatient Medications:    Accu-Chek FastClix Lancets MISC, USE AS INSTRUCTED TO TEST BLOOD SUGAR DAILY, Disp: 102 each, Rfl: 1   ACCU-CHEK GUIDE test strip, USE AS INSTRUCTED TO TEST BLOOD SUGAR DAILY, Disp: 100 strip, Rfl: 1   albuterol (VENTOLIN HFA) 108 (90 Base) MCG/ACT inhaler, Inhale 1-2 puffs into the lungs every 6 (six) hours as needed., Disp: 18 g, Rfl: 0   aspirin EC 325 MG tablet, Take 325 mg by mouth daily., Disp: , Rfl:    blood glucose meter kit and supplies, Dispense based on patient and insurance preference. Use up to 2 times daily as directed. (FOR ICD-10 E10.9, E11.9)., Disp: 1 each, Rfl: 0   botulinum toxin Type A (BOTOX) 200 units injection, Inject 155 units IM into multiple sites of face head and neck every 90 days, Disp: 1 each, Rfl: 4   Cranberry 1000 MG CAPS, Take 1 capsule by mouth 2 (two) times daily., Disp: , Rfl:  donepezil (ARICEPT) 10 MG tablet, Take 1 tablet (10 mg total) by mouth at bedtime., Disp: 90 tablet, Rfl: 3   dorzolamide-timolol (COSOPT) 22.3-6.8 MG/ML ophthalmic solution, Place 1 drop into both eyes 2 (two) times daily., Disp: , Rfl:    DULoxetine (CYMBALTA) 60 MG capsule, TAKE 1 CAPSULE EVERY DAY FOR ANXIETY AND DEPRESSION., Disp: 90 capsule, Rfl: 0   fluticasone (FLONASE) 50 MCG/ACT nasal spray, USE 2 SPRAYS IN EACH NOSTRIL DAILY AS NEEDED FOR ALLERGIES OR RHINITIS, Disp: 48 g, Rfl: 0   gabapentin (NEURONTIN) 600 MG tablet, Take 1 tablet (600 mg total) by mouth 3 (three) times daily. For neuropathy, Disp: 270 tablet, Rfl: 0   latanoprost (XALATAN) 0.005 % ophthalmic solution, Place 1 drop into both eyes at  bedtime., Disp: , Rfl:    levothyroxine (SYNTHROID) 112 MCG tablet, TAKE 1 TABLET EVERY MORNING MONDAY THROUGH SATURDAY; AND 1/2 TABLET ON SUNDAY. TAKE ON AN EMPTY STOMACH WITH WATER ONLY, Disp: 78 tablet, Rfl: 1   meclizine (ANTIVERT) 25 MG tablet, TAKE 1 TABLET (25 MG TOTAL) BY MOUTH 2 (TWO) TIMES DAILY AS NEEDED FOR DIZZINESS., Disp: 180 tablet, Rfl: 0   meloxicam (MOBIC) 15 MG tablet, TAKE 1 TABLET DAILY AS NEEDED FOR PAIN, Disp: 90 tablet, Rfl: 0   memantine (NAMENDA) 5 MG tablet, Take 1 tablet twice a day, Disp: 180 tablet, Rfl: 3   Netarsudil Dimesylate (RHOPRESSA) 0.02 % SOLN, Place 1 drop into both eyes daily at 6 (six) AM., Disp: , Rfl:    nystatin cream (MYCOSTATIN), Apply 1 Application topically 2 (two) times daily as needed., Disp: 30 g, Rfl: 2   omeprazole (PRILOSEC) 20 MG capsule, TAKE 1 CAPSULE TWICE DAILY FOR HEARTBURN., Disp: 180 capsule, Rfl: 0   pilocarpine (PILOCAR) 2 % ophthalmic solution, Place 1 drop into both eyes 4 (four) times daily. , Disp: , Rfl:    propranolol (INDERAL) 80 MG tablet, Take 1 tablet (80 mg total) by mouth daily. For headache prevention., Disp: 90 tablet, Rfl: 0   rosuvastatin (CRESTOR) 10 MG tablet, TAKE 1 TABLET EVERY DAY FOR CHOLESTEROL, Disp: 90 tablet, Rfl: 0   SUMAtriptan (IMITREX) 25 MG tablet, Take 1 tablet (25 mg total) by mouth every 2 (two) hours as needed for migraine. May repeat in 2 hours if headache persists or recurs., Disp: 10 tablet, Rfl: 11   traZODone (DESYREL) 100 MG tablet, TAKE 2 TABLETS AT BEDTIME AS NEEDED FOR SLEEP, Disp: 180 tablet, Rfl: 0  Allergies: No Known Allergies  Past Medical History, Surgical history, Social history, and Family History were reviewed and updated.  Review of Systems: Review of Systems  Constitutional:  Negative for appetite change and unexpected weight change.  Respiratory:  Negative for shortness of breath.   Gastrointestinal:  Negative for abdominal distention, abdominal pain, blood in stool,  constipation, diarrhea, nausea, rectal pain and vomiting.  Genitourinary:  Negative for bladder incontinence, difficulty urinating, dyspareunia, dysuria, frequency, hematuria, menstrual problem, nocturia, pelvic pain, vaginal bleeding and vaginal discharge.      Physical Exam:  height is 5\' 4"  (1.626 m) and weight is 165 lb (74.8 kg). Her tympanic temperature is 99 F (37.2 C). Her blood pressure is 136/65 and her pulse is 63. Her respiration is 16 and oxygen saturation is 97%.   Physical Exam Vitals and nursing note reviewed.  Constitutional:      General: She is not in acute distress.    Appearance: Normal appearance. She is normal weight. She is not ill-appearing, toxic-appearing or diaphoretic.  Cardiovascular:     Rate and Rhythm: Normal rate and regular rhythm.     Heart sounds: Normal heart sounds.  Pulmonary:     Effort: Pulmonary effort is normal.     Breath sounds: Normal breath sounds.  Abdominal:     General: Bowel sounds are normal. There is no distension.     Palpations: Abdomen is soft. There is no mass.     Tenderness: There is no abdominal tenderness. There is no right CVA tenderness, left CVA tenderness, guarding or rebound.     Hernia: No hernia is present.  Skin:    General: Skin is warm.     Coloration: Skin is not jaundiced or pale.  Neurological:     Mental Status: She is alert and oriented to person, place, and time.      Lab Results  Component Value Date   WBC 8.1 03/28/2022   HGB 11.8 (L) 03/28/2022   HCT 36.6 03/28/2022   MCV 96.3 03/28/2022   PLT 217 03/28/2022     Chemistry      Component Value Date/Time   NA 135 07/20/2022 1417   K 4.0 07/20/2022 1417   CL 101 07/20/2022 1417   CO2 25 07/20/2022 1417   BUN 15 07/20/2022 1417   CREATININE 0.96 07/20/2022 1417      Component Value Date/Time   CALCIUM 9.0 07/20/2022 1417   ALKPHOS 74 07/20/2022 1417   AST 44 (H) 07/20/2022 1417   ALT 29 07/20/2022 1417   BILITOT 0.6 07/20/2022 1417       Impression and Plan: 1. Encounter for follow-up surveillance of ovarian cancer  2. High grade ovarian cancer [facility])    Shannon Higgins is a 88 y.o. female with a history of high-grade serous carcinoma of the ovary FIGO stage IC pT1 cpNX cMX s/p bilateral salpingo-oophorectomy. CA 125 pending. CBC/CMP reviewed today with patient. No symptomatic evidence of recurrence. Physical exam is also reassuring.    Disposition: RTC 6 months MD, labs (CBC w/, CMP, CA 125)   Nelwyn Salisbury PA-C 3/20/20242:58 PM

## 2022-07-21 LAB — CA 125: Cancer Antigen (CA) 125: 16.1 U/mL (ref 0.0–38.1)

## 2022-08-01 NOTE — Telephone Encounter (Signed)
error 

## 2022-08-12 ENCOUNTER — Emergency Department
Admission: EM | Admit: 2022-08-12 | Discharge: 2022-08-12 | Disposition: A | Discharge status: Home / Self Care | Payer: Medicare PPO | Attending: Emergency Medicine | Admitting: Emergency Medicine

## 2022-08-12 DIAGNOSIS — R739 Hyperglycemia, unspecified: Secondary | ICD-10-CM | POA: Diagnosis present

## 2022-08-12 DIAGNOSIS — E1165 Type 2 diabetes mellitus with hyperglycemia: Secondary | ICD-10-CM | POA: Diagnosis not present

## 2022-08-12 LAB — URINALYSIS, ROUTINE W REFLEX MICROSCOPIC
Bacteria, UA: NONE SEEN
Bilirubin Urine: NEGATIVE
Glucose, UA: NEGATIVE mg/dL
Hgb urine dipstick: NEGATIVE
Ketones, ur: NEGATIVE mg/dL
Nitrite: NEGATIVE
Protein, ur: 100 mg/dL — AB
Specific Gravity, Urine: 1.02 (ref 1.005–1.030)
pH: 6 (ref 5.0–8.0)

## 2022-08-12 LAB — CBC WITH DIFFERENTIAL/PLATELET
Abs Immature Granulocytes: 0.03 10*3/uL (ref 0.00–0.07)
Basophils Absolute: 0 10*3/uL (ref 0.0–0.1)
Basophils Relative: 0 %
Eosinophils Absolute: 0.1 10*3/uL (ref 0.0–0.5)
Eosinophils Relative: 1 %
HCT: 40.6 % (ref 36.0–46.0)
Hemoglobin: 13.7 g/dL (ref 12.0–15.0)
Immature Granulocytes: 0 %
Lymphocytes Relative: 22 %
Lymphs Abs: 2.6 10*3/uL (ref 0.7–4.0)
MCH: 31.9 pg (ref 26.0–34.0)
MCHC: 33.7 g/dL (ref 30.0–36.0)
MCV: 94.6 fL (ref 80.0–100.0)
Monocytes Absolute: 1.1 10*3/uL — ABNORMAL HIGH (ref 0.1–1.0)
Monocytes Relative: 10 %
Neutro Abs: 7.7 10*3/uL (ref 1.7–7.7)
Neutrophils Relative %: 67 %
Platelets: 223 10*3/uL (ref 150–400)
RBC: 4.29 MIL/uL (ref 3.87–5.11)
RDW: 13.1 % (ref 11.5–15.5)
WBC: 11.6 10*3/uL — ABNORMAL HIGH (ref 4.0–10.5)
nRBC: 0 % (ref 0.0–0.2)

## 2022-08-12 LAB — CBG MONITORING, ED: Glucose-Capillary: 168 mg/dL — ABNORMAL HIGH (ref 70–99)

## 2022-08-12 LAB — COMPREHENSIVE METABOLIC PANEL
ALT: 23 U/L (ref 0–44)
AST: 30 U/L (ref 15–41)
Albumin: 3.9 g/dL (ref 3.5–5.0)
Alkaline Phosphatase: 75 U/L (ref 38–126)
Anion gap: 11 (ref 5–15)
BUN: 13 mg/dL (ref 8–23)
CO2: 27 mmol/L (ref 22–32)
Calcium: 9.1 mg/dL (ref 8.9–10.3)
Chloride: 94 mmol/L — ABNORMAL LOW (ref 98–111)
Creatinine, Ser: 0.81 mg/dL (ref 0.44–1.00)
GFR, Estimated: >60 mL/min (ref >60)
Glucose, Bld: 165 mg/dL — ABNORMAL HIGH (ref 70–99)
Potassium: 3.4 mmol/L — ABNORMAL LOW (ref 3.5–5.1)
Sodium: 132 mmol/L — ABNORMAL LOW (ref 135–145)
Total Bilirubin: 1.2 mg/dL (ref 0.3–1.2)
Total Protein: 7.9 g/dL (ref 6.5–8.1)

## 2022-08-12 LAB — LIPASE, BLOOD: Lipase: 44 U/L (ref 11–51)

## 2022-08-12 MED ORDER — METFORMIN HCL 500 MG PO TABS: 500.0000 mg | ORAL_TABLET | Freq: Two times a day (BID) | ORAL | 6 refills | Status: DC

## 2022-08-12 MED ORDER — SODIUM CHLORIDE 0.9 % IV BOLUS
1000.0000 mL | Freq: Once | INTRAVENOUS | Status: AC
  Administered 2022-08-12: 1000 mL via INTRAVENOUS

## 2022-08-12 NOTE — ED Triage Notes (Signed)
Patient is here today for further evaluation regarding an abnormal urinalysis at the urgent care; Results showed Glucose 100 mg/dl, Ketones: trace, and Protein: >300; Note from office states that they were concerned about diabetic ketoacidosis; CBG here 168 and patient does not appear to be in any distress at this time; Her only complaint is that she has been nauseated over the last few days

## 2022-08-12 NOTE — ED Provider Notes (Signed)
[facility] Provider Note  Patient Contact: 7:38 PM (approximate)   History   Fatigue (Patient is here today for further evaluation regarding an abnormal urinalysis at the urgent care; Results showed Glucose 100 mg/dl, Ketones: trace, and Protein: >300; Note from office states that they were concerned about diabetic ketoacidosis; CBG here 168 and patient does not appear to be in any distress at this time; Her only complaint is that she has been nauseated over the last few days)   HPI  Shannon Higgins is a 88 y.o. female who presents to the emergency department for evaluation for possible DKA.  Reportedly patient had a day where she only a candy, ice cream and cake.  Patient has felt unwell since.  She is a type II diabetic but has not been on medication as she had a period where her A1c's were in the low sixes.  Patient does not check her glucose at home.  She went to urgent care thinking she may have a UTI and after the urinalysis they reportedly had glucose spilling, ketones and protein she was sent for evaluation for DKA.  Patient denies any urinary tract symptoms of dysuria, polyuria, hematuria.  No abdominal pain.  Denies nausea, vomiting, diarrhea.  Has had a poor appetite     Physical Exam   Triage Vital Signs: ED Triage Vitals  Enc Vitals Group     BP 08/12/22 1649 139/81     Pulse Rate 08/12/22 1649 80     Resp 08/12/22 1649 17     Temp 08/12/22 1649 98.3 F (36.8 C)     Temp Source 08/12/22 1649 Oral     SpO2 08/12/22 1649 97 %     Weight 08/12/22 1650 170 lb (77.1 kg)     Height 08/12/22 1650 5\' 4"  (1.626 m)     Head Circumference --      Peak Flow --      Pain Score --      Pain Loc --      Pain Edu? --      Excl. in GC? --     Most recent vital signs: Vitals:   08/12/22 1649  BP: 139/81  Pulse: 80  Resp: 17  Temp: 98.3 F (36.8 C)  SpO2: 97%     General: Alert and in no acute distress. ENT:      Ears:       Nose: No  congestion/rhinnorhea.      Mouth/Throat: Mucous membranes are moist.  Cardiovascular:  Good peripheral perfusion Respiratory: Normal respiratory effort without tachypnea or retractions. Lungs CTAB. Good air entry to the bases with no decreased or absent breath sounds Gastrointestinal: Bowel sounds 4 quadrants. Soft and nontender to palpation. No guarding or rigidity. No palpable masses. No distention. No CVA tenderness. Musculoskeletal: Full range of motion to all extremities.  Neurologic:  No gross focal neurologic deficits are appreciated.  Skin:   No rash noted Other:   ED Results / Procedures / Treatments   Labs (all labs ordered are listed, but only abnormal results are displayed) Labs Reviewed  URINALYSIS, ROUTINE W REFLEX MICROSCOPIC - Abnormal; Notable for the following components:      Result Value   Color, Urine YELLOW (*)    APPearance HAZY (*)    Protein, ur 100 (*)    Leukocytes,Ua TRACE (*)    All other components within normal limits  COMPREHENSIVE METABOLIC PANEL - Abnormal; Notable for the following components:  Sodium 132 (*)    Potassium 3.4 (*)    Chloride 94 (*)    Glucose, Bld 165 (*)    All other components within normal limits  CBC WITH DIFFERENTIAL/PLATELET - Abnormal; Notable for the following components:   WBC 11.6 (*)    Monocytes Absolute 1.1 (*)    All other components within normal limits  CBG MONITORING, ED - Abnormal; Notable for the following components:   Glucose-Capillary 168 (*)    All other components within normal limits  LIPASE, BLOOD     EKG     RADIOLOGY    No results found.  PROCEDURES:  Critical Care performed: No  Procedures   MEDICATIONS ORDERED IN ED: Medications  sodium chloride 0.9 % bolus 1,000 mL (0 mLs Intravenous Stopped 08/12/22 1838)     IMPRESSION / MDM / ASSESSMENT AND PLAN / ED COURSE  I reviewed the triage vital signs and the nursing notes.                                 Differential  diagnosis includes, but is not limited to, hyperglycemia, hyperosmolar state, DKA, viral illness, UTI, pneumonia, colitis, pancreatitis  Patient's presentation is most consistent with acute presentation with potential threat to life or bodily function.   Patient's diagnosis is consistent with hyperglycemia.  Patient presents emergency department complaining of feeling off.  Patient has a history of type 2 diabetes, has been on metformin in the past but eventually her A1c's were in the low sixes and she was stopped on her medication.  Her last A1c was 7.9 roughly 3 months ago.  She has not restarted her metformin.  Patient had a day where she only ate candy, ice cream and cake.  Started to feel unwell.  She has had a history of UTIs in the past, presented to urgent care for evaluation.  Urinalysis was not concerning for UTI but had reportedly glucose, protein and ketones in the urine.  She was sent to the ED for evaluation for possible DKA.  Gap is 11, glucose is 165, she is not spilling ketones or sugar at this time.  She received a liter of fluid, feels back to her baseline.  At this time I have low suspicion for DKA given her labs and her assessment.  Suspect that patient did have some hyperglycemia secondary to her intake of sugar.  No evidence of UTI on labs.  Will restart the patient on her metformin.  Follow-up primary care as needed.  Return precautions discussed with the patient.. Patient is given ED precautions to return to the ED for any worsening or new symptoms.     FINAL CLINICAL IMPRESSION(S) / ED DIAGNOSES   Final diagnoses:  Hyperglycemia  Controlled type 2 diabetes mellitus with hyperglycemia, without long-term current use of insulin     Rx / DC Orders   ED Discharge Orders          Ordered    metFORMIN (GLUCOPHAGE) 500 MG tablet  2 times daily with meals        08/12/22 1946             Note:  This document was prepared using Dragon voice recognition software and may  include unintentional dictation errors.   Lanette Hampshire 08/12/22 1946    Pilar Jarvis, MD 08/14/22 0040

## 2022-08-13 ENCOUNTER — Other Ambulatory Visit: Payer: Self-pay | Admitting: Primary Care

## 2022-08-13 ENCOUNTER — Encounter: Payer: Self-pay | Admitting: Neurology

## 2022-08-13 DIAGNOSIS — F331 Major depressive disorder, recurrent, moderate: Secondary | ICD-10-CM

## 2022-08-13 DIAGNOSIS — G8929 Other chronic pain: Secondary | ICD-10-CM

## 2022-08-13 DIAGNOSIS — E785 Hyperlipidemia, unspecified: Secondary | ICD-10-CM

## 2022-08-13 DIAGNOSIS — G4700 Insomnia, unspecified: Secondary | ICD-10-CM

## 2022-08-13 DIAGNOSIS — M792 Neuralgia and neuritis, unspecified: Secondary | ICD-10-CM

## 2022-08-13 DIAGNOSIS — K219 Gastro-esophageal reflux disease without esophagitis: Secondary | ICD-10-CM

## 2022-08-13 DIAGNOSIS — R42 Dizziness and giddiness: Secondary | ICD-10-CM

## 2022-08-15 ENCOUNTER — Telehealth: Payer: Self-pay

## 2022-08-15 NOTE — Telephone Encounter (Signed)
Per patient mychart message,   I just spoke with [facility] since they had not reached out to [place]. They said they have contacted your office for the last 15 days about authorizing the Botox but have not heard back. Will you please contact them to approve so we can get it shipped to you by her appointment on Friday Thanks   PA team are you able or have you had time to contact [facility] about delivery?

## 2022-08-16 ENCOUNTER — Ambulatory Visit: Payer: Self-pay

## 2022-08-16 ENCOUNTER — Ambulatory Visit: Payer: Medicare PPO | Admitting: Neurology

## 2022-08-16 ENCOUNTER — Telehealth: Payer: Self-pay

## 2022-08-16 NOTE — Chronic Care Management (AMB) (Signed)
   08/16/2022  TALEISHA KACZYNSKI 09-Apr-1935 [id number]   Reason for Encounter: Patient is not currently enrolled in the CCM program. CCM status changed to previously enrolled.   Katha Cabal RN Care Manager/Chronic Care Management [phone]

## 2022-08-16 NOTE — Transitions of Care (Post Inpatient/ED Visit) (Signed)
Unable to reach pt by phone and I left v/m requesting pt to call [facility] [phone].      08/16/2022  Name: Shannon Higgins MRN: [id number] DOB: [birth year]  Today's TOC FU Call Status: Today's TOC FU Call Status:: Unsuccessul Call (1st Attempt)  Attempted to reach the patient regarding the most recent Inpatient/ED visit.  Follow Up Plan: Additional outreach attempts will be made to reach the patient to complete the Transitions of Care (Post Inpatient/ED visit) call.   Signature Lewanda Rife, LPN

## 2022-08-17 NOTE — Transitions of Care (Post Inpatient/ED Visit) (Addendum)
Left v/m requesting pt to call [facility] [phone].     08/17/2022  Name: Shannon Higgins MRN: [id number] DOB: 1935-03-12  Today's TOC FU Call Status: Today's TOC FU Call Status:: Unsuccessful Call (2nd Attempt) Unsuccessful Call (2nd Attempt) Date: 08/17/22  Attempted to reach the patient regarding the most recent Inpatient/ED visit.  Follow Up Plan: Additional outreach attempts will be made to reach the patient to complete the Transitions of Care (Post Inpatient/ED visit) call.   Signature Lewanda Rife, LPN

## 2022-08-18 NOTE — Telephone Encounter (Signed)
Pt's daughter, [name], called back returning Rena's call. Call back # [phone]

## 2022-08-18 NOTE — Telephone Encounter (Signed)
Unable to reach pt by phone sending note to Allayne Gitelman NP.

## 2022-08-18 NOTE — Transitions of Care (Post Inpatient/ED Visit) (Signed)
Shannon Higgins pts daughter who is a Engineer, civil (consulting) (DPR signed) called back and said pt is doing a little better with fatigue.pt is adhering closer to a diabetic diet than before ED visit; pt is also taking Metformn 500 mg bid with meals.      08/18/2022  Name: Shannon Higgins MRN: [id number] DOB: 1934-05-16  Today's TOC FU Call Status: Today's TOC FU Call Status:: Successful TOC FU Call Competed Unsuccessful Call (2nd Attempt) Date: 08/17/22 [facility] FU Call Complete Date: 08/18/22  Transition Care Management Follow-up Telephone Call Date of Discharge: 08/12/22 Discharge Facility: [facility]) Type of Discharge: Emergency Department Reason for ED Visit: Other: (fatigue) How have you been since you were released from the hospital?: Better (got a bag of IV fluids and felt little better per [name] pts daughter DPR signed)) Any questions or concerns?: No  Items Reviewed: Did you receive and understand the discharge instructions provided?: Yes Medications obtained and verified?: Yes (Medications Reviewed) (ED added Metformin 500mg  one bid with meals. pt does not ck FBS.) Any new allergies since your discharge?: No Dietary orders reviewed?: Yes Type of Diet Ordered:: pt observing diabetic diet Do you have support at home?: Yes People in Home: child(ren), adult Name of Support/Comfort Primary Source: [name] is at home with pt unless [name] is working.  Home Care and Equipment/Supplies: Were Home Health Services Ordered?: NA Any new equipment or medical supplies ordered?: NA  Functional Questionnaire: Do you need assistance with bathing/showering or dressing?: No (Shannon Higgins makes sure pt gets in shower safely.) Do you need assistance with meal preparation?: No (Shannon Higgins makes sure food is prepared;pt can prepare food but sometimes forgets.) Do you need assistance with eating?: No Do you have difficulty maintaining continence: Yes (wears depends when bladder is full) Do you need assistance with getting  out of bed/getting out of a chair/moving?: No Do you have difficulty managing or taking your medications?: Yes (Shannon Higgins is preparing meds)  Follow up appointments reviewed: PCP Follow-up appointment confirmed?: Yes (08/24/22 at 2:20 PM with Allayne Gitelman NP.) Date of PCP follow-up appointment?: 08/24/22 Follow-up Provider: [facility] Follow-up appointment confirmed?: NA Do you need transportation to your follow-up appointment?: No Do you understand care options if your condition(s) worsen?: Yes-patient verbalized understanding    SIGNATURE Lewanda Rife, LPN

## 2022-08-19 ENCOUNTER — Ambulatory Visit: Payer: Medicare PPO | Admitting: Neurology

## 2022-08-19 DIAGNOSIS — G43709 Chronic migraine without aura, not intractable, without status migrainosus: Secondary | ICD-10-CM | POA: Diagnosis not present

## 2022-08-19 MED ORDER — ONABOTULINUMTOXINA 100 UNITS IJ SOLR
200.0000 [IU] | Freq: Once | INTRAMUSCULAR | Status: AC
Start: 2022-08-19 — End: 2022-08-19
  Administered 2022-08-19: 155 [IU] via INTRAMUSCULAR

## 2022-08-19 NOTE — Progress Notes (Signed)
[facility]  ° °Procedure Note Botox ° °Attending: Dr. Jasim Harari ° °Preoperative Diagnosis(es): Chronic migraine ° °Consent obtained from: The patient °Benefits discussed included, but were not limited to decreased muscle tightness, increased joint range of motion, and decreased pain.  Risk discussed included, but were not limited pain and discomfort, bleeding, bruising, excessive weakness, venous thrombosis, muscle atrophy and dysphagia.  Anticipated outcomes of the procedure as well as he risks and benefits of the alternatives to the procedure, and the roles and tasks of the personnel to be involved, were discussed with the patient, and the patient consents to the procedure and agrees to proceed. A copy of the patient medication guide was given to the patient which explains the blackbox warning. ° °Patients identity and treatment sites confirmed Yes.  . ° °Details of Procedure: °Skin was cleaned with alcohol. Prior to injection, the needle plunger was aspirated to make sure the needle was not within a blood vessel.  There was no blood retrieved on aspiration.   ° °Following is a summary of the muscles injected  And the amount of Botulinum toxin used: ° °Dilution °200 units of Botox was reconstituted with 4 ml of preservative free normal saline. °Time of reconstitution: At the time of the office visit (<30 minutes prior to injection)  ° °Injections  °155 total units of Botox was injected with a 30 gauge needle. ° °Injection Sites: °L occipitalis: 15 units- 3 sites  °R occiptalis: 15 units- 3 sites ° °L upper trapezius: 15 units- 3 sites °R upper trapezius: 15 units- 3 sits          °L paraspinal: 10 units- 2 sites °R paraspinal: 10 units- 2 sites ° °Face °L frontalis(2 injection sites):10 units   °R frontalis(2 injection sites):10 units         °L corrugator: 5 units   °R corrugator: 5 units           °Procerus: 5 units   °L temporalis: 20 units °R temporalis: 20 units  ° °Agent:  °200 units of botulinum Type  A (Onobotulinum Toxin type A) was reconstituted with 4 ml of preservative free normal saline.  °Time of reconstitution: At the time of the office visit (<30 minutes prior to injection)  ° ° ° Total injected (Units):  155 ° Total wasted (Units):  45 ° °Patient tolerated procedure well without complications.   °Reinjection is anticipated in 3 months. ° ° °

## 2022-08-24 ENCOUNTER — Other Ambulatory Visit: Payer: Self-pay | Admitting: Primary Care

## 2022-08-24 ENCOUNTER — Ambulatory Visit (INDEPENDENT_AMBULATORY_CARE_PROVIDER_SITE_OTHER): Payer: Medicare PPO | Admitting: Primary Care

## 2022-08-24 VITALS — BP 122/78 | HR 70 | Temp 97.3°F | Ht 64.0 in | Wt 167.0 lb

## 2022-08-24 DIAGNOSIS — M545 Other chronic pain: Secondary | ICD-10-CM

## 2022-08-24 DIAGNOSIS — M25512 Pain in left shoulder: Secondary | ICD-10-CM

## 2022-08-24 DIAGNOSIS — M25511 Pain in right shoulder: Secondary | ICD-10-CM | POA: Diagnosis not present

## 2022-08-24 DIAGNOSIS — E1142 Type 2 diabetes mellitus with diabetic polyneuropathy: Secondary | ICD-10-CM

## 2022-08-24 DIAGNOSIS — Z7984 Long term (current) use of oral hypoglycemic drugs: Secondary | ICD-10-CM

## 2022-08-24 DIAGNOSIS — G8929 Other chronic pain: Secondary | ICD-10-CM | POA: Insufficient documentation

## 2022-08-24 MED ORDER — LANCET DEVICE MISC
1.0000 | Freq: Three times a day (TID) | 0 refills | Status: AC
Start: 2022-08-24 — End: 2022-09-23

## 2022-08-24 MED ORDER — BLOOD GLUCOSE TEST VI STRP
1.0000 | ORAL_STRIP | Freq: Three times a day (TID) | 0 refills | Status: DC
Start: 2022-08-24 — End: 2022-12-11

## 2022-08-24 MED ORDER — METFORMIN HCL ER 500 MG PO TB24
500.0000 mg | ORAL_TABLET | Freq: Every day | ORAL | 1 refills | Status: DC
Start: 2022-08-24 — End: 2023-04-17

## 2022-08-24 MED ORDER — PREDNISONE 20 MG PO TABS
ORAL_TABLET | ORAL | 0 refills | Status: DC
Start: 2022-08-24 — End: 2022-11-08

## 2022-08-24 MED ORDER — LANCETS MISC. MISC
1.0000 | Freq: Three times a day (TID) | 0 refills | Status: DC
Start: 2022-08-24 — End: 2023-11-23

## 2022-08-24 MED ORDER — BLOOD GLUCOSE MONITORING SUPPL DEVI
1.0000 | Freq: Three times a day (TID) | 0 refills | Status: DC
Start: 2022-08-24 — End: 2023-11-23

## 2022-08-24 NOTE — Progress Notes (Signed)
Subjective:    Patient ID: Shannon Higgins, female    DOB: 1934/12/11, 88 y.o.   MRN: [id number]  Shoulder Pain     Shannon Higgins is a very pleasant 88 y.o. female with a history of hypertension, ovarian cancer, type 2 diabetes, hyperlipidemia, recurrent falls, recurrent UTI, who presents today for ED follow up and to discuss shoulder pain.  Her daughter joins [place] today.  1) Type 2 Diabetes: She presented to [facility] ED on 08/12/22 for fatigue. Previously evaluated at Urgent Care and urine was positive for glucose, ketones, and protein. The concern was for diabetic ketoacidosis so she was sent to the ED.   During her stay in the ED her glucose was 165 with anion gap of 11. UA was not concerning for infection. Her metformin was resumed as she had increased intake of sweets. She was discharged home later that day.   Since her ED visit she's resumed her metformin 500 mg BID. She did have a few episodes of explosive diarrhea which has improved. She continues to feel fatigued but no worse than baseline. She is feeling better than she did during her ED visit.   She needs a new glucometer kit.   2) Chronic Shoulder Pain: Chronic for years. Waxes and wanes, progressing over the last few months. Follows with orthopedics and has a history bilateral shoulder replacements. She declined injections and physical therapy during her last orthopedic visit. She is interested in a course of prednisone as this is historically helpful.   Review of Systems  Constitutional:  Positive for fatigue.  Gastrointestinal:  Negative for nausea.  Endocrine: Negative for polydipsia and polyuria.  Genitourinary:  Negative for pelvic pain.  Musculoskeletal:  Positive for arthralgias.         Past Medical History:  Diagnosis Date   Acute bronchitis 05/13/2022   Acute metabolic encephalopathy 04/23/2020   Acute tubular injury of transplanted kidney 06/14/2016   AKI (acute kidney injury) 06/14/2016   Asthma     Breast cancer 2015   left   Bronchitis    CAP (community acquired pneumonia) 04/29/2012   Chronic sinusitis    Closed fracture of medial malleolus 05/11/2018   Confusion 12/06/2016   COVID-19 virus infection 12/17/2020   Diabetes mellitus without complication    Family history of breast cancer    GERD (gastroesophageal reflux disease)    History of ankle fracture 05/14/2018   Last Assessment & Plan:  With a recent fall. Right medial malleous. Has ortho follow up soon, splinted now and tylenol helps her pain   History of recurrent UTIs    Hypertension    Hypokalemia 04/26/2012   Hyponatremia    Hypothyroidism    Imbalance 09/02/2019   Insomnia    Migraine    Neuropathic pain    Ovarian cancer 2022   Ovarian mass, left 07/14/2020   Personal history of breast cancer    Personal history of chemotherapy current   bilateral ovarian ca   Pneumonia    Rheumatoid arthritis    Sepsis 04/26/2012   Sepsis secondary to UTI 04/26/2012   Sleep apnea    Stroke    seen in CT scan   Thyroid disease    Transient hypotension 06/14/2016    Social History   Socioeconomic History   Marital status: Widowed    Spouse name: Not on file   Number of children: 2   Years of education: Not on file   Highest education level: Some college, no  degree  Occupational History   Occupation: retired   Tobacco Use   Smoking status: Never   Smokeless tobacco: Never  Vaping Use   Vaping Use: Never used  Substance and Sexual Activity   Alcohol use: No   Drug use: No   Sexual activity: Not Currently  Other Topics Concern   Not on file  Social History Narrative   Pt lives in 1 story home with her daughter, Shannon Higgins and Shannon Higgins   Has 2 adult daughters   Highest level of education: some college   Worked mainly as Environmental health practitioner.   Social Determinants of Health   Financial Resource Strain: Low Risk  (08/24/2022)   Overall Financial Resource Strain (CARDIA)    Difficulty of Paying Living  Expenses: Not hard at all  Food Insecurity: No Food Insecurity (08/24/2022)   Hunger Vital Sign    Worried About Running Out of Food in the Last Year: Never true    Ran Out of Food in the Last Year: Never true  Transportation Needs: No Transportation Needs (08/24/2022)   PRAPARE - Administrator, Civil Service (Medical): No    Lack of Transportation (Non-Medical): No  Physical Activity: Inactive (08/24/2022)   Exercise Vital Sign    Days of Exercise per Week: 0 days    Minutes of Exercise per Session: 0 min  Stress: No Stress Concern Present (08/24/2022)   [employer] of Occupational Health - Occupational Stress Questionnaire    Feeling of Stress : Not at all  Social Connections: Socially Isolated (08/24/2022)   Social Connection and Isolation Panel [NHANES]    Frequency of Communication with Friends and Family: More than three times a week    Frequency of Social Gatherings with Friends and Family: More than three times a week    Attends Religious Services: Never    Database administrator or Organizations: No    Attends Banker Meetings: Never    Marital Status: Widowed  Intimate Partner Violence: Not At Risk (03/01/2022)   Humiliation, Afraid, Rape, and Kick questionnaire    Fear of Current or Ex-Partner: No    Emotionally Abused: No    Physically Abused: No    Sexually Abused: No    Past Surgical History:  Procedure Laterality Date   ABDOMINAL HYSTERECTOMY     BACK SURGERY     BREAST BIOPSY Right ?`   benign   BREAST LUMPECTOMY Left 2015   breast ca   CHOLECYSTECTOMY     JOINT REPLACEMENT     LAPAROTOMY N/A 07/14/2020   Procedure: LAPAROTOMY;  Surgeon: Nadara Mustard, MD;  Location: [facility] ORS;  Service: Gynecology;  Laterality: N/A;   OOPHORECTOMY     OVARIAN CYST REMOVAL     PORTA CATH INSERTION N/A 07/27/2020   Procedure: PORTA CATH INSERTION;  Surgeon: Annice Needy, MD;  Location: [facility] IN[facility]IVE [facility];  Service: Cardiovascular;  Laterality:  N/A;   PORTA CATH REMOVAL N/A 04/05/2021   Procedure: PORTA CATH REMOVAL;  Surgeon: Annice Needy, MD;  Location: [facility];  Service: Cardiovascular;  Laterality: N/A;   REPLACEMENT TOTAL KNEE Right    SALPINGOOPHORECTOMY Bilateral 07/14/2020   Procedure: OPEN SALPINGO OOPHORECTOMY;  Surgeon: Nadara Mustard, MD;  Location: [facility] ORS;  Service: Gynecology;  Laterality: Bilateral;   TOTAL SHOULDER REPLACEMENT Left     Family History  Problem Relation Age of Onset   Diabetes Daughter    Hypertension Daughter  Fibromyalgia Daughter    GER disease Daughter    Fibromyalgia Daughter    Crohn's disease Daughter    Asthma Daughter    Hypertension Mother    Breast cancer Other    Breast cancer Niece    Uterine cancer Niece     No Known Allergies  Current Outpatient Medications on File Prior to Visit  Medication Sig Dispense Refill   Accu-Chek FastClix Lancets MISC USE AS INSTRUCTED TO TEST BLOOD SUGAR DAILY 102 each 1   albuterol (VENTOLIN HFA) 108 (90 Base) MCG/ACT inhaler Inhale 1-2 puffs into the lungs every 6 (six) hours as needed. 18 g 0   aspirin EC 325 MG tablet Take 325 mg by mouth daily.     b complex vitamins capsule Take 1 capsule by mouth daily.     blood glucose meter kit and supplies Dispense based on patient and insurance preference. Use up to 2 times daily as directed. (FOR ICD-10 E10.9, E11.9). 1 each 0   botulinum toxin Type A (BOTOX) 200 units injection Inject 155 units IM into multiple sites of face head and neck every 90 days 1 each 4   Cranberry 1000 MG CAPS Take 1 capsule by mouth 2 (two) times daily.     donepezil (ARICEPT) 10 MG tablet Take 1 tablet (10 mg total) by mouth at bedtime. 90 tablet 3   dorzolamide-timolol (COSOPT) 22.3-6.8 MG/ML ophthalmic solution Place 1 drop into both eyes 2 (two) times daily.     DULoxetine (CYMBALTA) 60 MG capsule TAKE 1 CAPSULE EVERY DAY FOR ANXIETY AND DEPRESSION. 90 capsule 2   fluticasone (FLONASE) 50 MCG/ACT nasal  spray USE 2 SPRAYS IN EACH NOSTRIL DAILY AS NEEDED FOR ALLERGIES OR RHINITIS 48 g 2   gabapentin (NEURONTIN) 600 MG tablet TAKE 1 TABLET THREE TIMES DAILY FOR NEUROPATHY 270 tablet 2   latanoprost (XALATAN) 0.005 % ophthalmic solution Place 1 drop into both eyes at bedtime.     levothyroxine (SYNTHROID) 112 MCG tablet TAKE 1 TABLET EVERY MORNING MONDAY THROUGH SATURDAY; AND 1/2 TABLET ON SUNDAY. TAKE ON AN EMPTY STOMACH WITH WATER ONLY 78 tablet 1   meclizine (ANTIVERT) 25 MG tablet TAKE 1 TABLET (25 MG TOTAL) BY MOUTH 2 (TWO) TIMES DAILY AS NEEDED FOR DIZZINESS. 180 tablet 0   memantine (NAMENDA) 5 MG tablet Take 1 tablet twice a day 180 tablet 3   Netarsudil Dimesylate (RHOPRESSA) 0.02 % SOLN Place 1 drop into both eyes daily at 6 (six) AM.     nystatin cream (MYCOSTATIN) Apply 1 Application topically 2 (two) times daily as needed. 30 g 2   omeprazole (PRILOSEC) 20 MG capsule TAKE 1 CAPSULE TWICE DAILY FOR HEARTBURN. 180 capsule 2   pilocarpine (PILOCAR) 2 % ophthalmic solution Place 1 drop into both eyes 4 (four) times daily.      propranolol (INDERAL) 80 MG tablet TAKE 1 TABLET EVERY DAY AS DIRECTED FOR HEADACHE PREVENTION. 90 tablet 2   rosuvastatin (CRESTOR) 10 MG tablet TAKE 1 TABLET EVERY DAY FOR CHOLESTEROL 90 tablet 2   SUMAtriptan (IMITREX) 25 MG tablet Take 1 tablet (25 mg total) by mouth every 2 (two) hours as needed for migraine. May repeat in 2 hours if headache persists or recurs. 10 tablet 11   traZODone (DESYREL) 100 MG tablet TAKE 2 TABLETS AT BEDTIME AS NEEDED FOR SLEEP 180 tablet 2   VITAMIN D PO Take by mouth.     No current facility-administered medications on file prior to visit.  BP 122/78   Pulse 70   Temp (!) 97.3 F (36.3 C) (Temporal)   Ht  (1.626 m)   Wt 167 lb (75.8 kg)   SpO2 96%   BMI 28.67 kg/m  Objective:   Physical Exam Cardiovascular:     Rate and Rhythm: Normal rate and regular rhythm.  Pulmonary:     Effort: Pulmonary effort is normal.      Breath sounds: Normal breath sounds.  Musculoskeletal:     Cervical back: Neck supple.  Skin:    General: Skin is warm and dry.           Assessment & Plan:  Type 2 diabetes mellitus with peripheral neuropathy Assessment & Plan: Changing metformin to ER version to reduce overall pill count and reduce diarrhea.  Start metformin ER 500 mg daily.  Rx for glucometer kit sent to pharmacy. We will see her in July as scheduled.   Orders: -     metFORMIN HCl ER; Take 1 tablet (500 mg total) by mouth daily with breakfast. for diabetes.  Dispense: 90 tablet; Refill: 1 -     Blood Glucose Monitoring Suppl; 1 each by Does not apply route in the morning, at noon, and at bedtime. May substitute to any manufacturer covered by patient's insurance.  Dispense: 1 each; Refill: 0 -     Blood Glucose Test; 1 each by In Vitro route in the morning, at noon, and at bedtime. May substitute to any manufacturer covered by patient's insurance.  Dispense: 300 strip; Refill: 0 -     Lancet Device; 1 each by Does not apply route in the morning, at noon, and at bedtime. May substitute to any manufacturer covered by patient's insurance.  Dispense: 1 each; Refill: 0 -     Lancets Misc.; 1 each by Does not apply route in the morning, at noon, and at bedtime. May substitute to any manufacturer covered by patient's insurance.  Dispense: 300 each; Refill: 0  Chronic pain of both shoulders Assessment & Plan: Following with orthopedics.  Start prednisone tablets. Take two tablets my mouth once daily in the morning for four days, then one tablet once daily in the morning for four days.  Discussed that this will increase glucose temporarily.   Orders: -     predniSONE; Take two tablets my mouth once daily in the morning for four days, then one tablet once daily in the morning for four days.  Dispense: 12 tablet; Refill: 0        Doreene Nest, NP

## 2022-08-24 NOTE — Patient Instructions (Signed)
Start prednisone tablets. Take two tablets my mouth once daily in the morning for four days, then one tablet once daily in the morning for four days.   I changed your metformin to the ER version. Take metformin ER 500 mg one pill once daily.  Pick up the glucometer kit from [facility].   It was a pleasure to see you today!

## 2022-08-24 NOTE — Assessment & Plan Note (Signed)
Changing metformin to ER version to reduce overall pill count and reduce diarrhea.  Start metformin ER 500 mg daily.  Rx for glucometer kit sent to pharmacy. We will see her in July as scheduled.

## 2022-08-24 NOTE — Assessment & Plan Note (Addendum)
Following with orthopedics.  Start prednisone tablets. Take two tablets my mouth once daily in the morning for four days, then one tablet once daily in the morning for four days.  Discussed that this will increase glucose temporarily.

## 2022-08-31 ENCOUNTER — Encounter: Payer: Self-pay | Admitting: Obstetrics and Gynecology

## 2022-08-31 ENCOUNTER — Inpatient Hospital Stay: Payer: Medicare PPO | Attending: Obstetrics and Gynecology | Admitting: Obstetrics and Gynecology

## 2022-08-31 VITALS — BP 157/85 | HR 67 | Temp 97.6°F | Resp 20 | Wt 166.5 lb

## 2022-08-31 DIAGNOSIS — Z8543 Personal history of malignant neoplasm of ovary: Secondary | ICD-10-CM

## 2022-08-31 DIAGNOSIS — Z9079 Acquired absence of other genital organ(s): Secondary | ICD-10-CM | POA: Diagnosis not present

## 2022-08-31 DIAGNOSIS — Z90710 Acquired absence of both cervix and uterus: Secondary | ICD-10-CM | POA: Diagnosis not present

## 2022-08-31 DIAGNOSIS — Z08 Encounter for follow-up examination after completed treatment for malignant neoplasm: Secondary | ICD-10-CM

## 2022-08-31 DIAGNOSIS — Z9221 Personal history of antineoplastic chemotherapy: Secondary | ICD-10-CM | POA: Insufficient documentation

## 2022-08-31 DIAGNOSIS — Z853 Personal history of malignant neoplasm of breast: Secondary | ICD-10-CM | POA: Insufficient documentation

## 2022-08-31 DIAGNOSIS — C569 Malignant neoplasm of unspecified ovary: Secondary | ICD-10-CM

## 2022-08-31 NOTE — Progress Notes (Signed)
Gynecologic Oncology Consult Visit   Referring Provider: Dr. 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 female, who returns to clinic for continued surveillance of high grade serous ovarian cancer s/p BSO, laparotomy, partial omentectomy with some spillage on 07/14/20, followed by 4 cycles of adjuvant carbo-doxil, completed 09/2020 (last 2 cycles omitted d/t poor tolerance). She returns to clinic for continued surveillance.   CA 125 was not elevated at time of diagnosis but has been followed and has been normal. Last 07/20/22 was 16.1.   No interval imaging. No new complaints.   Gyn Oncology history 04/24/20 admitted to [facility].  Presented to the ER with several day history of weakness, confusion, intermittent fever and thought to have UTI with sepsis.   04/23/20 - CT Abdomen Pelvis w contrast 13.5 cm x 11.8 cm x 15.2 cm simple, cystic appearing area is seen within the pelvis along the midline. This extends from the lower abdomen to the region just above the urinary bladder. Marked severity right-sided hydronephrosis and hydroureter with mild right perinephric inflammatory fat stranding and delayed right renal cortical enhancement. No obstructing renal calculi are identified. The dilated right ureter extends to the level of a large pelvic cyst   Seen by Dr Tiburcio Pea for the large pelvic mass.  CA 125  22.9 HE4   99 Postmenopausal ROMA 2.65  Underwent Bilateral salpingo-oophorectomy, laparotomy, partial omentectomy with Dr. Tiburcio Pea and Dr. Jerene Pitch on 07/14/20.  Controlled drainage of the mass done with purse string with some spillage.  No disease seen outside ovary.    07/14/20  DIAGNOSIS:  A. OVARY AND FALLOPIAN TUBE, LEFT; SALPINGO-OOPHORECTOMY:  - HIGH-GRADE SEROUS CARCINOMA INVOLVING SEROUS CYSTADENOMA OF OVARY, SEE SUMMARY BELOW.  - FALLOPIAN TUBE NEGATIVE FOR INVASIVE AND INTRAEPITHELIAL CARCINOMA.   B. OVARY AND FALLOPIAN  TUBE, RIGHT; SALPINGO-OOPHORECTOMY:  - HIGH-GRADE SEROUS CARCINOMA, ONE 1.0 CM FOCUS, INVOLVING FRAGMENTED  OVARY.  - FALLOPIAN TUBE NEGATIVE FOR INVASIVE AND INTRAEPITHELIAL CARCINOMA.   C. OMENTUM; BIOPSY:  - NEGATIVE FOR MALIGNANCY.   CANCER CASE SUMMARY: OVARY or FALLOPIAN TUBE or PRIMARY PERITONEUM. Standard(s): AJCC-UICC 8, FIGO Cancer Report 2018   SPECIMEN  Procedure: Bilateral salpingo-oophorectomy and omental biopsy  Specimen Integrity:       Left ovary integrity: Capsule ruptured (intraoperative spillage of cyst contents)       Right ovary integrity: Fragmented  TUMOR  Tumor Site: Bilateral ovaries  Tumor Size: Greatest dimension: 4 cm  Histologic Type: High-grade serous carcinoma  Histologic Grade: High-grade  Ovarian Surface Involvement:       Left: Not identified       Right: cannot be determined due to fragmented specimen  Fallopian Tube Surface Involvement: Not identified  Other Tissue/ Organ Involvement: Not applicable  Largest Extrapelvic Peritoneal Focus: Not applicable  Peritoneal/Ascitic Fluid Involvement: Results pending, see [id number]  Chemotherapy Response Score (CRS): Not applicable   REGIONAL LYMPH NODES  Regional Lymph Nodes Status: Not applicable (no regional lymph nodes submitted)   DISTANT METASTASIS  Distant Site(s) Involved, if applicable: Not applicable   ADDENDUM:  Peritoneal/Ascitic Fluid Involvement:  Not identified.  [id number]: pelvic washings negative for malignancy   FINAL PATHOLOGIC STAGE CLASSIFICATION (pTNM, AJCC 8th Edition):  pT1c1 (surgical spill),  pN not assigned (no nodes submitted)  pM - Not applicable   CA 125  06/11/20 22.9  07/21/20 98.1 (post surgical) 11/24/20 20.3 03/31/21 14.7 09/20/21 15.1    Received four cycles of carboplatin/Doxil post  op.  Did not get Taxol due to pre existing neuropathy. Dr. Smith Robert stopped treatment after 4 cycles due to poor tolerance.   Genetic testing Myriad MyRisk negative. HRD  testing not done.   Problem List: Patient Active Problem List   Diagnosis Date Noted   Chronic pain of both shoulders 08/24/2022   Other fatigue 01/27/2022   Personal history of breast cancer    Family history of breast cancer    High grade ovarian cancer (HCC) 07/21/2020   S/P bilateral oophorectomy 07/21/2020   Decreased mobility 09/02/2019   Preventative health care 03/04/2019   Insomnia 09/27/2018   History of TIA (transient ischemic attack) 09/13/2018   Recurrent UTI 06/20/2018   Frequent falls 05/14/2018   Gout 05/14/2018   Type 2 diabetes mellitus with peripheral neuropathy (HCC) 05/11/2018   Type 2 diabetes mellitus (HCC) 05/11/2018   Orthostatic hypotension 05/08/2018   History of breast cancer 10/17/2017   MDD (major depressive disorder), recurrent episode (HCC) 06/21/2017   Chronic low back pain 01/13/2017   Hyperlipidemia 12/06/2016   Glaucoma 12/06/2016   Neuropathic pain 12/06/2016   Malignant melanoma of skin (HCC) 12/06/2016   Dementia arising in the senium and presenium (HCC) 12/06/2016   Chronic headache disorder 12/06/2016   Hypothyroidism 06/14/2016   Anemia 04/26/2012   Hypertensive disorder 04/26/2012   Gastroesophageal reflux disease 04/26/2012   Obstructive sleep apnea syndrome 04/26/2012    Past Medical History: Past Medical History:  Diagnosis Date   Acute bronchitis 05/13/2022   Acute metabolic encephalopathy 04/23/2020   Acute tubular injury of transplanted kidney (HCC) 06/14/2016   AKI (acute kidney injury) (HCC) 06/14/2016   Asthma    Breast cancer (HCC) 2015   left   Bronchitis    CAP (community acquired pneumonia) 04/29/2012   Chronic sinusitis    Closed fracture of medial malleolus 05/11/2018   Confusion 12/06/2016   COVID-19 virus infection 12/17/2020   Diabetes mellitus without complication (HCC)    Family history of breast cancer    GERD (gastroesophageal reflux disease)    History of ankle fracture 05/14/2018   Last  Assessment & Plan:  With a recent fall. Right medial malleous. Has ortho follow up soon, splinted now and tylenol helps her pain   History of recurrent UTIs    Hypertension    Hypokalemia 04/26/2012   Hyponatremia    Hypothyroidism    Imbalance 09/02/2019   Insomnia    Migraine    Neuropathic pain    Ovarian cancer (HCC) 2022   Ovarian mass, left 07/14/2020   Personal history of breast cancer    Personal history of chemotherapy current   bilateral ovarian ca   Pneumonia    Rheumatoid arthritis (HCC)    Sepsis (HCC) 04/26/2012   Sepsis secondary to UTI (HCC) 04/26/2012   Sleep apnea    Stroke [facility]    seen in CT scan   Thyroid disease    Transient hypotension 06/14/2016    Past Surgical History: Past Surgical History:  Procedure Laterality Date   ABDOMINAL HYSTERECTOMY     BACK SURGERY     BREAST BIOPSY Right ?`   benign   BREAST LUMPECTOMY Left 2015   breast ca   CHOLECYSTECTOMY     JOINT REPLACEMENT     LAPAROTOMY N/A 07/14/2020   Procedure: LAPAROTOMY;  Surgeon: Nadara Mustard, MD;  Location: [facility] ORS;  Service: Gynecology;  Laterality: N/A;   OOPHORECTOMY     OVARIAN CYST REMOVAL  PORTA CATH INSERTION N/A 07/27/2020   Procedure: PORTA CATH INSERTION;  Surgeon: Annice Needy, MD;  Location: [facility] INVASIVE CV LAB;  Service: Cardiovascular;  Laterality: N/A;   PORTA CATH REMOVAL N/A 04/05/2021   Procedure: PORTA CATH REMOVAL;  Surgeon: Annice Needy, MD;  Location: [facility] INVASIVE CV LAB;  Service: Cardiovascular;  Laterality: N/A;   REPLACEMENT TOTAL KNEE Right    SALPINGOOPHORECTOMY Bilateral 07/14/2020   Procedure: OPEN SALPINGO OOPHORECTOMY;  Surgeon: Nadara Mustard, MD;  Location: [facility] ORS;  Service: Gynecology;  Laterality: Bilateral;   TOTAL SHOULDER REPLACEMENT Left    Past Gynecologic History:  Post menopausal  OB History:  OB History  Gravida Para Term Preterm AB Living  2 2 2     2   SAB IAB Ectopic Multiple Live Births               # Outcome Date  GA Lbr Len/2nd Weight Sex Delivery Anes PTL Lv  2 Term           1 Term             Family History: Family History  Problem Relation Age of Onset   Diabetes Daughter    Hypertension Daughter    Fibromyalgia Daughter    GER disease Daughter    Fibromyalgia Daughter    Crohn's disease Daughter    Asthma Daughter    Hypertension Mother    Breast cancer Other    Breast cancer Niece    Uterine cancer Niece     Social History: Social History   Socioeconomic History   Marital status: Widowed    Spouse name: Not on file   Number of children: 2   Years of education: Not on file   Highest education level: Some college, no degree  Occupational History   Occupation: retired   Tobacco Use   Smoking status: Never   Smokeless tobacco: Never  Vaping Use   Vaping Use: Never used  Substance and Sexual Activity   Alcohol use: No   Drug use: No   Sexual activity: Not Currently  Other Topics Concern   Not on file  Social History Narrative   Pt lives in 1 story home with her daughter, [name] and [name]'s husband   Has 2 adult daughters   Highest level of education: some college   Worked mainly as Environmental health practitioner.   Social Determinants of Health   Financial Resource Strain: Low Risk  (08/24/2022)   Overall Financial Resource Strain (CARDIA)    Difficulty of Paying Living Expenses: Not hard at all  Food Insecurity: No Food Insecurity (08/24/2022)   Hunger Vital Sign    Worried About Running Out of Food in the Last Year: Never true    Ran Out of Food in the Last Year: Never true  Transportation Needs: No Transportation Needs (08/24/2022)   PRAPARE - Administrator, Civil Service (Medical): No    Lack of Transportation (Non-Medical): No  Physical Activity: Inactive (08/24/2022)   Exercise Vital Sign    Days of Exercise per Week: 0 days    Minutes of Exercise per Session: 0 min  Stress: No Stress Concern Present (08/24/2022)   [employer] of Occupational Health  - Occupational Stress Questionnaire    Feeling of Stress : Not at all  Social Connections: Socially Isolated (08/24/2022)   Social Connection and Isolation Panel [NHANES]    Frequency of Communication with Friends and Family: More than three times a week  Frequency of Social Gatherings with Friends and Family: More than three times a week    Attends Religious Services: Never    Database administrator or Organizations: No    Attends Banker Meetings: Never    Marital Status: Widowed  Intimate Partner Violence: Not At Risk (03/01/2022)   Humiliation, Afraid, Rape, and Kick questionnaire    Fear of Current or Ex-Partner: No    Emotionally Abused: No    Physically Abused: No    Sexually Abused: No   Immunization History  Administered Date(s) Administered   Fluad Quad(high Dose 65+) 05/05/2020, 01/15/2021, 01/27/2022   Influenza,inj,Quad PF,6+ Mos 01/29/2018, 03/04/2019   PFIZER(Purple Top)SARS-COV-2 Vaccination 05/21/2019, 06/10/2019, 07/03/2020   Pneumococcal Polysaccharide-23 06/20/2018   Tdap 12/09/2018   Zoster Recombinat (Shingrix) 03/06/2019    Allergies: No Known Allergies  Current Medications: Current Outpatient Medications  Medication Sig Dispense Refill   Accu-Chek FastClix Lancets MISC USE AS INSTRUCTED TO TEST BLOOD SUGAR DAILY 102 each 1   albuterol (VENTOLIN HFA) 108 (90 Base) MCG/ACT inhaler Inhale 1-2 puffs into the lungs every 6 (six) hours as needed. 18 g 0   aspirin EC 325 MG tablet Take 325 mg by mouth daily.     b complex vitamins capsule Take 1 capsule by mouth daily.     blood glucose meter kit and supplies Dispense based on patient and insurance preference. Use up to 2 times daily as directed. (FOR ICD-10 E10.9, E11.9). 1 each 0   Blood Glucose Monitoring Suppl DEVI 1 each by Does not apply route in the morning, at noon, and at bedtime. May substitute to any manufacturer covered by patient's insurance. 1 each 0   botulinum toxin Type A (BOTOX)  200 units injection Inject 155 units IM into multiple sites of face head and neck every 90 days 1 each 4   Cranberry 1000 MG CAPS Take 1 capsule by mouth 2 (two) times daily.     donepezil (ARICEPT) 10 MG tablet Take 1 tablet (10 mg total) by mouth at bedtime. 90 tablet 3   dorzolamide-timolol (COSOPT) 22.3-6.8 MG/ML ophthalmic solution Place 1 drop into both eyes 2 (two) times daily.     DULoxetine (CYMBALTA) 60 MG capsule TAKE 1 CAPSULE EVERY DAY FOR ANXIETY AND DEPRESSION. 90 capsule 2   fluticasone (FLONASE) 50 MCG/ACT nasal spray USE 2 SPRAYS IN EACH NOSTRIL DAILY AS NEEDED FOR ALLERGIES OR RHINITIS 48 g 2   gabapentin (NEURONTIN) 600 MG tablet TAKE 1 TABLET THREE TIMES DAILY FOR NEUROPATHY 270 tablet 2   Glucose Blood (BLOOD GLUCOSE TEST STRIPS) STRP 1 each by In Vitro route in the morning, at noon, and at bedtime. May substitute to any manufacturer covered by patient's insurance. 300 strip 0   Lancet Device MISC 1 each by Does not apply route in the morning, at noon, and at bedtime. May substitute to any manufacturer covered by patient's insurance. 1 each 0   Lancets Misc. MISC 1 each by Does not apply route in the morning, at noon, and at bedtime. May substitute to any manufacturer covered by patient's insurance. 300 each 0   latanoprost (XALATAN) 0.005 % ophthalmic solution Place 1 drop into both eyes at bedtime.     levothyroxine (SYNTHROID) 112 MCG tablet TAKE 1 TABLET EVERY MORNING MONDAY THROUGH SATURDAY; AND 1/2 TABLET ON SUNDAY. TAKE ON AN EMPTY STOMACH WITH WATER ONLY 78 tablet 1   meclizine (ANTIVERT) 25 MG tablet TAKE 1 TABLET (25 MG TOTAL) BY MOUTH 2 (  TWO) TIMES DAILY AS NEEDED FOR DIZZINESS. 180 tablet 0   meloxicam (MOBIC) 15 MG tablet TAKE 1 TABLET DAILY AS NEEDED FOR PAIN 90 tablet 0   memantine (NAMENDA) 5 MG tablet Take 1 tablet twice a day 180 tablet 3   metFORMIN (GLUCOPHAGE-XR) 500 MG 24 hr tablet Take 1 tablet (500 mg total) by mouth daily with breakfast. for diabetes. 90  tablet 1   Netarsudil Dimesylate (RHOPRESSA) 0.02 % SOLN Place 1 drop into both eyes daily at 6 (six) AM.     nystatin cream (MYCOSTATIN) Apply 1 Application topically 2 (two) times daily as needed. 30 g 2   omeprazole (PRILOSEC) 20 MG capsule TAKE 1 CAPSULE TWICE DAILY FOR HEARTBURN. 180 capsule 2   pilocarpine (PILOCAR) 2 % ophthalmic solution Place 1 drop into both eyes 4 (four) times daily.      predniSONE (DELTASONE) 20 MG tablet Take two tablets my mouth once daily in the morning for four days, then one tablet once daily in the morning for four days. 12 tablet 0   propranolol (INDERAL) 80 MG tablet TAKE 1 TABLET EVERY DAY AS DIRECTED FOR HEADACHE PREVENTION. 90 tablet 2   rosuvastatin (CRESTOR) 10 MG tablet TAKE 1 TABLET EVERY DAY FOR CHOLESTEROL 90 tablet 2   SUMAtriptan (IMITREX) 25 MG tablet Take 1 tablet (25 mg total) by mouth every 2 (two) hours as needed for migraine. May repeat in 2 hours if headache persists or recurs. 10 tablet 11   traZODone (DESYREL) 100 MG tablet TAKE 2 TABLETS AT BEDTIME AS NEEDED FOR SLEEP 180 tablet 2   VITAMIN D PO Take by mouth.     No current facility-administered medications for this visit.   Review of Systems General:  no complaints Skin: no complaints Eyes: no complaints HEENT: no complaints Breasts: no complaints Pulmonary: no complaints Cardiac: no complaints Gastrointestinal: no complaints Genitourinary/Sexual: no complaints Ob/Gyn: no complaints Musculoskeletal: no complaints Hematology: no complaints Neurologic/Psych: no complaints   Objective:  Physical Examination:  BP (!) 157/85   Pulse 67   Temp 97.6 F (36.4 C)   Resp 20   Wt 166 lb 8 oz (75.5 kg)   SpO2 100%   BMI 28.58 kg/m     ECOG Performance Status: 1 - Symptomatic but completely ambulatory  GENERAL: Patient is a well appearing female in no acute distress HEENT:  Sclera clear. Anicteric NODES:  Negative axillary, supraclavicular, inguinal lymph node  survery LUNGS:  Clear to auscultation bilaterally.   HEART:  Regular rate and rhythm.  ABDOMEN:  Soft, nontender, nondistended.  No hernias, incisions well healed. No masses or ascites EXTREMITIES:  No peripheral edema. Atraumatic. No cyanosis SKIN:  Clear with no obvious rashes or skin changes.  NEURO:  Nonfocal. Well oriented.  Appropriate affect.  Pelvic: Chaperoned by nursing EGBUS: no lesions Vagina- no discharge, bleeding, or lesions Cervix, Uterus, Ovaries: surgically absent  Rectovaginal: no masses  Lab Review CA125 ordered for today  Radiologic Imaging: No imaging on site today    Assessment:  Shannon Higgins is a 88 y.o. 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  Negative washings, omentum and abdominal survey as well as CT scan A/P. Finished four cycles of carbo/doxil in 6/22.  Last two cycles omitted due to poor tolerance.  CA125 not elevated at diagnosis. Last CA125 was 3/24 which was normal. No evidence of disease on exam today. Clinically, asymptomatic.    Genetic testing with  Myriad MyRisk negative  HRD testing not done.   History of breast cancer age 25.  Medical co-morbidities complicating care: AODM with PN, HTN, stroke. Plan:   Problem List Items Addressed This Visit       Endocrine   High grade ovarian cancer [facility])   Other Visit Diagnoses     Encounter for follow-up surveillance of ovarian cancer    -  Primary      Discussed potential for PARP inhibitor therapy at some point in the future if she has a recurrence and somatic tumor tissue testing for HRD would be useful at that point.  Will see her back for follow up in 8 months and she will see Dr Smith Robert in 4 months.   Consuello Masse, DNP, AGNP-C, AOCNP [facility] [phone] (clinic)  I personally interviewed and examined the patient. Agreed with the above/below plan of care. I have directly contributed to  assessment and plan of care of this patient and educated and discussed with patient and family.  Leida Lauth, MD

## 2022-08-31 NOTE — Telephone Encounter (Signed)
Botox is covered through 05/02/2023 via [facility]

## 2022-10-07 ENCOUNTER — Telehealth: Payer: Medicare HMO

## 2022-10-30 DIAGNOSIS — N390 Urinary tract infection, site not specified: Secondary | ICD-10-CM | POA: Diagnosis not present

## 2022-10-30 DIAGNOSIS — R350 Frequency of micturition: Secondary | ICD-10-CM | POA: Diagnosis not present

## 2022-10-31 ENCOUNTER — Telehealth: Payer: Self-pay | Admitting: Primary Care

## 2022-10-31 DIAGNOSIS — N390 Urinary tract infection, site not specified: Secondary | ICD-10-CM | POA: Diagnosis not present

## 2022-10-31 DIAGNOSIS — R350 Frequency of micturition: Secondary | ICD-10-CM | POA: Diagnosis not present

## 2022-10-31 NOTE — Telephone Encounter (Signed)
A caller from [facility] company contacted the office regarding this patient, states patient is enrolled in a plan for chronic conditions and they called to verify this patient has Diabetes. Caller stated they needed confirmation from pcp office to continue this patient's specific set of benefits. Please advise, thank you. Can be reached at [phone], case # [id number].

## 2022-10-31 NOTE — Telephone Encounter (Signed)
Called and spoke with [facility] caller, verified patient does have diabetes. This is all that the caller requested. Nothing further needed at this time

## 2022-11-05 ENCOUNTER — Other Ambulatory Visit: Payer: Self-pay | Admitting: Primary Care

## 2022-11-05 DIAGNOSIS — M545 Low back pain, unspecified: Secondary | ICD-10-CM

## 2022-11-07 DIAGNOSIS — H401133 Primary open-angle glaucoma, bilateral, severe stage: Secondary | ICD-10-CM | POA: Diagnosis not present

## 2022-11-08 ENCOUNTER — Ambulatory Visit (INDEPENDENT_AMBULATORY_CARE_PROVIDER_SITE_OTHER): Payer: Medicare HMO | Admitting: Family Medicine

## 2022-11-08 ENCOUNTER — Encounter: Payer: Self-pay | Admitting: Family Medicine

## 2022-11-08 VITALS — BP 150/82 | HR 57 | Temp 97.9°F | Ht 64.0 in | Wt 163.5 lb

## 2022-11-08 DIAGNOSIS — R102 Pelvic and perineal pain unspecified side: Secondary | ICD-10-CM | POA: Insufficient documentation

## 2022-11-08 NOTE — Assessment & Plan Note (Signed)
Acute, symptoms improved status post course of Keflex. Patient is not a great historian and seems to report fluctuating symptoms.   Unable to evaluate with urinalysis given patient took Azo this morning.  Will send urine for culture.

## 2022-11-08 NOTE — Progress Notes (Signed)
Patient ID: Shannon Higgins, female    DOB: 30-Aug-1934, 88 y.o.   MRN: [id number]  This visit was conducted in person.  BP (!) 150/82 (BP Location: Left Arm, Patient Position: Sitting, Cuff Size: Large)   Pulse (!) 57   Temp 97.9 F (36.6 C) (Temporal)   Ht 5\' 4"  (1.626 m)   Wt 163 lb 8 oz (74.2 kg)   SpO2 94%   BMI 28.06 kg/m    CC:  Chief Complaint  Patient presents with   Hospitalization Follow-up    [facility] for UTI on June 30-Treated with Keflex    Subjective:   HPI: Shannon Higgins is a 88 y.o. female patient of Shannon Reel, NP presenting on 11/08/2022 for Hospitalization Follow-up [facility] for UTI on June 30-Treated with Keflex)  Seen at [facility] for urinary tract infection on June 30.  Treated with Keflex  Prior to urgent care visit vaginal achyness for few day, no frequency or urgency.  No fever, no flank pain.   Symptoms improved but  still had some vaginal pressure.   Still intermittent vaginal pain now. She states today she does not have symptoms but was still having symptoms when asked daughter to make an appt.  No fever, no flu like symptoms. Minimal water.    History of  recurrent UTI... preventative antibiotic in past and cranberry.       Relevant past medical, surgical, family and social history reviewed and updated as indicated. Interim medical history since our last visit reviewed. Allergies and medications reviewed and updated. Outpatient Medications Prior to Visit  Medication Sig Dispense Refill   Accu-Chek FastClix Lancets MISC USE AS INSTRUCTED TO TEST BLOOD SUGAR DAILY 102 each 1   albuterol (VENTOLIN HFA) 108 (90 Base) MCG/ACT inhaler Inhale 1-2 puffs into the lungs every 6 (six) hours as needed. 18 g 0   aspirin EC 325 MG tablet Take 325 mg by mouth daily.     b complex vitamins capsule Take 1 capsule by mouth daily.     blood glucose meter kit and supplies Dispense based on patient and insurance preference. Use up to 2 times  daily as directed. (FOR ICD-10 E10.9, E11.9). 1 each 0   Blood Glucose Monitoring Suppl DEVI 1 each by Does not apply route in the morning, at noon, and at bedtime. May substitute to any manufacturer covered by patient's insurance. 1 each 0   botulinum toxin Type A (BOTOX) 200 units injection Inject 155 units IM into multiple sites of face head and neck every 90 days 1 each 4   Cranberry 1000 MG CAPS Take 1 capsule by mouth 2 (two) times daily.     donepezil (ARICEPT) 10 MG tablet Take 1 tablet (10 mg total) by mouth at bedtime. 90 tablet 3   dorzolamide-timolol (COSOPT) 22.3-6.8 MG/ML ophthalmic solution Place 1 drop into both eyes 2 (two) times daily.     DULoxetine (CYMBALTA) 60 MG capsule TAKE 1 CAPSULE EVERY DAY FOR ANXIETY AND DEPRESSION. 90 capsule 2   fluticasone (FLONASE) 50 MCG/ACT nasal spray USE 2 SPRAYS IN EACH NOSTRIL DAILY AS NEEDED FOR ALLERGIES OR RHINITIS 48 g 2   gabapentin (NEURONTIN) 600 MG tablet TAKE 1 TABLET THREE TIMES DAILY FOR NEUROPATHY 270 tablet 2   Glucose Blood (BLOOD GLUCOSE TEST STRIPS) STRP 1 each by In Vitro route in the morning, at noon, and at bedtime. May substitute to any manufacturer covered by patient's insurance. 300 strip 0  Lancets Misc. MISC 1 each by Does not apply route in the morning, at noon, and at bedtime. May substitute to any manufacturer covered by patient's insurance. 300 each 0   latanoprost (XALATAN) 0.005 % ophthalmic solution Place 1 drop into both eyes at bedtime.     levothyroxine (SYNTHROID) 112 MCG tablet TAKE 1 TABLET EVERY MORNING MONDAY THROUGH SATURDAY; AND 1/2 TABLET ON SUNDAY. TAKE ON AN EMPTY STOMACH WITH WATER ONLY 78 tablet 1   meclizine (ANTIVERT) 25 MG tablet TAKE 1 TABLET (25 MG TOTAL) BY MOUTH 2 (TWO) TIMES DAILY AS NEEDED FOR DIZZINESS. 180 tablet 0   meloxicam (MOBIC) 15 MG tablet TAKE 1 TABLET EVERY DAY AS NEEDED FOR PAIN 90 tablet 0   memantine (NAMENDA) 5 MG tablet Take 1 tablet twice a day 180 tablet 3   metFORMIN  (GLUCOPHAGE-XR) 500 MG 24 hr tablet Take 1 tablet (500 mg total) by mouth daily with breakfast. for diabetes. 90 tablet 1   Netarsudil Dimesylate (RHOPRESSA) 0.02 % SOLN Place 1 drop into both eyes daily at 6 (six) AM.     nystatin cream (MYCOSTATIN) Apply 1 Application topically 2 (two) times daily as needed. 30 g 2   omeprazole (PRILOSEC) 20 MG capsule TAKE 1 CAPSULE TWICE DAILY FOR HEARTBURN. 180 capsule 2   pilocarpine (PILOCAR) 2 % ophthalmic solution Place 1 drop into both eyes 4 (four) times daily.      propranolol (INDERAL) 80 MG tablet TAKE 1 TABLET EVERY DAY AS DIRECTED FOR HEADACHE PREVENTION. 90 tablet 2   rosuvastatin (CRESTOR) 10 MG tablet TAKE 1 TABLET EVERY DAY FOR CHOLESTEROL 90 tablet 2   SUMAtriptan (IMITREX) 25 MG tablet Take 1 tablet (25 mg total) by mouth every 2 (two) hours as needed for migraine. May repeat in 2 hours if headache persists or recurs. 10 tablet 11   traZODone (DESYREL) 100 MG tablet TAKE 2 TABLETS AT BEDTIME AS NEEDED FOR SLEEP 180 tablet 2   VITAMIN D PO Take by mouth.     predniSONE (DELTASONE) 20 MG tablet Take two tablets my mouth once daily in the morning for four days, then one tablet once daily in the morning for four days. 12 tablet 0   No facility-administered medications prior to visit.     Per HPI unless specifically indicated in ROS section below Review of Systems  Constitutional:  Negative for fatigue and fever.  HENT:  Negative for congestion.   Eyes:  Negative for pain.  Respiratory:  Negative for cough and shortness of breath.   Cardiovascular:  Negative for chest pain, palpitations and leg swelling.  Gastrointestinal:  Negative for abdominal pain.  Genitourinary:  Negative for dysuria and vaginal bleeding.  Musculoskeletal:  Negative for back pain.  Neurological:  Negative for syncope, light-headedness and headaches.  Psychiatric/Behavioral:  Negative for dysphoric mood.    Objective:  BP (!) 150/82 (BP Location: Left Arm, Patient  Position: Sitting, Cuff Size: Large)   Pulse (!) 57   Temp 97.9 F (36.6 C) (Temporal)   Ht 5\' 4"  (1.626 m)   Wt 163 lb 8 oz (74.2 kg)   SpO2 94%   BMI 28.06 kg/m   Wt Readings from Last 3 Encounters:  11/08/22 163 lb 8 oz (74.2 kg)  08/31/22 166 lb 8 oz (75.5 kg)  08/24/22 167 lb (75.8 kg)      Physical Exam Constitutional:      General: She is not in acute distress.    Appearance: Normal appearance. She is  well-developed. She is not ill-appearing or toxic-appearing.  HENT:     Head: Normocephalic.     Right Ear: Hearing, tympanic membrane, ear canal and external ear normal. Tympanic membrane is not erythematous, retracted or bulging.     Left Ear: Hearing, tympanic membrane, ear canal and external ear normal. Tympanic membrane is not erythematous, retracted or bulging.     Nose: No mucosal edema or rhinorrhea.     Right Sinus: No maxillary sinus tenderness or frontal sinus tenderness.     Left Sinus: No maxillary sinus tenderness or frontal sinus tenderness.     Mouth/Throat:     Pharynx: Uvula midline.  Eyes:     General: Lids are normal. Lids are everted, no foreign bodies appreciated.     Conjunctiva/sclera: Conjunctivae normal.     Pupils: Pupils are equal, round, and reactive to light.  Neck:     Thyroid: No thyroid mass or thyromegaly.     Vascular: No carotid bruit.     Trachea: Trachea normal.  Cardiovascular:     Rate and Rhythm: Normal rate and regular rhythm.     Pulses: Normal pulses.     Heart sounds: Normal heart sounds, S1 normal and S2 normal. No murmur heard.    No friction rub. No gallop.  Pulmonary:     Effort: Pulmonary effort is normal. No tachypnea or respiratory distress.     Breath sounds: Normal breath sounds. No decreased breath sounds, wheezing, rhonchi or rales.  Abdominal:     General: Bowel sounds are normal.     Palpations: Abdomen is soft.     Tenderness: There is no abdominal tenderness.  Musculoskeletal:     Cervical back: Normal  range of motion and neck supple.  Skin:    General: Skin is warm and dry.     Findings: No rash.  Neurological:     Mental Status: She is alert.  Psychiatric:        Mood and Affect: Mood is not anxious or depressed.        Speech: Speech normal.        Behavior: Behavior normal. Behavior is cooperative.        Thought Content: Thought content normal.        Judgment: Judgment normal.       Results for orders placed or performed during the hospital encounter of 08/12/22  Urinalysis, Routine w reflex microscopic -Urine, Clean Catch  Result Value Ref Range   Color, Urine YELLOW (A) YELLOW   APPearance HAZY (A) CLEAR   Specific Gravity, Urine 1.020 1.005 - 1.030   pH 6.0 5.0 - 8.0   Glucose, UA NEGATIVE NEGATIVE mg/dL   Hgb urine dipstick NEGATIVE NEGATIVE   Bilirubin Urine NEGATIVE NEGATIVE   Ketones, ur NEGATIVE NEGATIVE mg/dL   Protein, ur 161 (A) NEGATIVE mg/dL   Nitrite NEGATIVE NEGATIVE   Leukocytes,Ua TRACE (A) NEGATIVE   RBC / HPF 0-5 0 - 5 RBC/hpf   WBC, UA 6-10 0 - 5 WBC/hpf   Bacteria, UA NONE SEEN NONE SEEN   Squamous Epithelial / HPF 0-5 0 - 5 /HPF   Mucus PRESENT   Comprehensive metabolic panel  Result Value Ref Range   Sodium 132 (L) 135 - 145 mmol/L   Potassium 3.4 (L) 3.5 - 5.1 mmol/L   Chloride 94 (L) 98 - 111 mmol/L   CO2 27 22 - 32 mmol/L   Glucose, Bld 165 (H) 70 - 99 mg/dL   BUN 13 8 -  23 mg/dL   Creatinine, Ser 1.61 0.44 - 1.00 mg/dL   Calcium 9.1 8.9 - 09.6 mg/dL   Total Protein 7.9 6.5 - 8.1 g/dL   Albumin 3.9 3.5 - 5.0 g/dL   AST 30 15 - 41 U/L   ALT 23 0 - 44 U/L   Alkaline Phosphatase 75 38 - 126 U/L   Total Bilirubin 1.2 0.3 - 1.2 mg/dL   GFR, Estimated >04 >54 mL/min   Anion gap 11 5 - 15  Lipase, blood  Result Value Ref Range   Lipase 44 11 - 51 U/L  CBC with Differential  Result Value Ref Range   WBC 11.6 (H) 4.0 - 10.5 K/uL   RBC 4.29 3.87 - 5.11 MIL/uL   Hemoglobin 13.7 12.0 - 15.0 g/dL   HCT 09.8 11.9 - 14.7 %   MCV 94.6  80.0 - 100.0 fL   MCH 31.9 26.0 - 34.0 pg   MCHC 33.7 30.0 - 36.0 g/dL   RDW 82.9 56.2 - 13.0 %   Platelets 223 150 - 400 K/uL   nRBC 0.0 0.0 - 0.2 %   Neutrophils Relative % 67 %   Neutro Abs 7.7 1.7 - 7.7 K/uL   Lymphocytes Relative 22 %   Lymphs Abs 2.6 0.7 - 4.0 K/uL   Monocytes Relative 10 %   Monocytes Absolute 1.1 (H) 0.1 - 1.0 K/uL   Eosinophils Relative 1 %   Eosinophils Absolute 0.1 0.0 - 0.5 K/uL   Basophils Relative 0 %   Basophils Absolute 0.0 0.0 - 0.1 K/uL   Immature Granulocytes 0 %   Abs Immature Granulocytes 0.03 0.00 - 0.07 K/uL  CBG monitoring, ED  Result Value Ref Range   Glucose-Capillary 168 (H) 70 - 99 mg/dL   Comment 1 Notify RN    Comment 2 Document in Chart     Assessment and Plan  Vaginal pain Assessment & Plan: Acute, symptoms improved status post course of Keflex. Patient is not a great historian and seems to report fluctuating symptoms.   Unable to evaluate with urinalysis given patient took Azo this morning.  Will send urine for culture.    Orders: -     Urine Culture    No follow-ups on file.   Kerby Nora, MD

## 2022-11-11 ENCOUNTER — Other Ambulatory Visit: Payer: Self-pay | Admitting: Family Medicine

## 2022-11-11 LAB — URINE CULTURE
MICRO NUMBER:: 15176735
SPECIMEN QUALITY:: ADEQUATE

## 2022-11-11 MED ORDER — NITROFURANTOIN MONOHYD MACRO 100 MG PO CAPS: 100.0000 mg | ORAL_CAPSULE | Freq: Two times a day (BID) | ORAL | 0 refills | Status: DC

## 2022-11-18 ENCOUNTER — Ambulatory Visit (INDEPENDENT_AMBULATORY_CARE_PROVIDER_SITE_OTHER): Payer: Medicare HMO | Admitting: Neurology

## 2022-11-18 DIAGNOSIS — G43709 Chronic migraine without aura, not intractable, without status migrainosus: Secondary | ICD-10-CM | POA: Diagnosis not present

## 2022-11-18 MED ORDER — ONABOTULINUMTOXINA 100 UNITS IJ SOLR
200.0000 [IU] | Freq: Once | INTRAMUSCULAR | Status: AC
Start: 2022-11-18 — End: 2022-11-18
  Administered 2022-11-18: 155 [IU] via INTRAMUSCULAR

## 2022-11-18 NOTE — Progress Notes (Signed)

## 2022-11-28 ENCOUNTER — Other Ambulatory Visit: Payer: Self-pay | Admitting: Primary Care

## 2022-11-28 DIAGNOSIS — E039 Hypothyroidism, unspecified: Secondary | ICD-10-CM

## 2022-11-29 ENCOUNTER — Encounter: Payer: Self-pay | Admitting: Primary Care

## 2022-11-29 ENCOUNTER — Ambulatory Visit (INDEPENDENT_AMBULATORY_CARE_PROVIDER_SITE_OTHER): Payer: Medicare HMO | Admitting: Primary Care

## 2022-11-29 VITALS — BP 140/82 | HR 76 | Temp 98.4°F | Ht 64.0 in | Wt 163.0 lb

## 2022-11-29 DIAGNOSIS — E1142 Type 2 diabetes mellitus with diabetic polyneuropathy: Secondary | ICD-10-CM

## 2022-11-29 DIAGNOSIS — G4700 Insomnia, unspecified: Secondary | ICD-10-CM | POA: Diagnosis not present

## 2022-11-29 DIAGNOSIS — Z7984 Long term (current) use of oral hypoglycemic drugs: Secondary | ICD-10-CM

## 2022-11-29 LAB — POCT GLYCOSYLATED HEMOGLOBIN (HGB A1C): Hemoglobin A1C: 6.2 % — AB (ref 4.0–5.6)

## 2022-11-29 MED ORDER — MIRTAZAPINE 15 MG PO TABS
15.0000 mg | ORAL_TABLET | Freq: Every day | ORAL | 0 refills | Status: DC
Start: 2022-11-29 — End: 2023-02-03

## 2022-11-29 NOTE — Progress Notes (Signed)
Subjective:    Patient ID: Shannon Higgins, female    DOB: Nov 25, 1934, 88 y.o.   MRN: [id number]  HPI  Shannon Higgins is a very pleasant 88 y.o. female with a history of hypertension, type 2 diabetes, hypothyroidism, ovarian cancer, dementia, recurrent UTI, hyperlipidemia, chronic back pain, TIA who presents today for follow-up of diabetes and insomnia.   Her daughters join [place] today.  1) Type 2 Diabetes: Current medications include: Metformin XR 500 mg daily.   She is checking her blood glucose 2 times daily and is getting readings of:  AM fasting: 120s 2 hours after meals: 140s  Last A1C: 7.9 in January 2024, 6.2 today Last Eye Exam: Up-to-date Last Foot Exam: Due Pneumonia Vaccination: 2020 Urine Microalbumin: Up-to-date Statin: Rosuvastatin  Dietary changes since last visit: None. She eats candy and rice krispy, cookies.    Exercise: None  BP Readings from Last 3 Encounters:  11/29/22 (!) 140/82  11/08/22 (!) 150/82  08/31/22 (!) 157/85   2) Insomnia: Chronic for years. Has difficulty falling and staying asleep. She is waking up ever few hours throughout the night. She's managed on Trazodone 200 mg which has been ineffective most nights of the week.  She would like to try something else to help keep asleep during the night.  She does not notice drowsiness from gabapentin for which she takes 600 mg 3 times daily.     Review of Systems  Respiratory:  Negative for shortness of breath.   Cardiovascular:  Negative for chest pain.  Neurological:  Positive for numbness.         Past Medical History:  Diagnosis Date   Acute bronchitis 05/13/2022   Acute metabolic encephalopathy 04/23/2020   Acute tubular injury of transplanted kidney (HCC) 06/14/2016   AKI (acute kidney injury) (HCC) 06/14/2016   Asthma    Breast cancer (HCC) 2015   left   Bronchitis    CAP (community acquired pneumonia) 04/29/2012   Chronic sinusitis    Closed fracture of medial malleolus  05/11/2018   Confusion 12/06/2016   COVID-19 virus infection 12/17/2020   Diabetes mellitus without complication (HCC)    Family history of breast cancer    GERD (gastroesophageal reflux disease)    History of ankle fracture 05/14/2018   Last Assessment & Plan:  With a recent fall. Right medial malleous. Has ortho follow up soon, splinted now and tylenol helps her pain   History of recurrent UTIs    Hypertension    Hypokalemia 04/26/2012   Hyponatremia    Hypothyroidism    Imbalance 09/02/2019   Insomnia    Migraine    Neuropathic pain    Ovarian cancer (HCC) 2022   Ovarian mass, left 07/14/2020   Personal history of breast cancer    Personal history of chemotherapy current   bilateral ovarian ca   Pneumonia    Rheumatoid arthritis (HCC)    Sepsis (HCC) 04/26/2012   Sepsis secondary to UTI (HCC) 04/26/2012   Sleep apnea    Stroke [facility])    seen in CT scan   Thyroid disease    Transient hypotension 06/14/2016    Social History   Socioeconomic History   Marital status: Widowed    Spouse name: Not on file   Number of children: 2   Years of education: Not on file   Highest education level: Some college, no degree  Occupational History   Occupation: retired   Tobacco Use   Smoking status: Never  Smokeless tobacco: Never  Vaping Use   Vaping status: Never Used  Substance and Sexual Activity   Alcohol use: No   Drug use: No   Sexual activity: Not Currently  Other Topics Concern   Not on file  Social History Narrative   Pt lives in 1 story home with her daughter, [name] and [name]'s husband   Has 2 adult daughters   Highest level of education: some college   Worked mainly as Environmental health practitioner.   Social Determinants of Health   Financial Resource Strain: Low Risk  (08/24/2022)   Overall Financial Resource Strain (CARDIA)    Difficulty of Paying Living Expenses: Not hard at all  Food Insecurity: No Food Insecurity (08/24/2022)   Hunger Vital Sign    Worried  About Running Out of Food in the Last Year: Never true    Ran Out of Food in the Last Year: Never true  Transportation Needs: No Transportation Needs (08/24/2022)   PRAPARE - Administrator, Civil Service (Medical): No    Lack of Transportation (Non-Medical): No  Physical Activity: Inactive (08/24/2022)   Exercise Vital Sign    Days of Exercise per Week: 0 days    Minutes of Exercise per Session: 0 min  Stress: No Stress Concern Present (08/24/2022)   [employer] of Occupational Health - Occupational Stress Questionnaire    Feeling of Stress : Not at all  Social Connections: Socially Isolated (08/24/2022)   Social Connection and Isolation Panel [NHANES]    Frequency of Communication with Friends and Family: More than three times a week    Frequency of Social Gatherings with Friends and Family: More than three times a week    Attends Religious Services: Never    Database administrator or Organizations: No    Attends Banker Meetings: Never    Marital Status: Widowed  Intimate Partner Violence: Not At Risk (03/01/2022)   Humiliation, Afraid, Rape, and Kick questionnaire    Fear of Current or Ex-Partner: No    Emotionally Abused: No    Physically Abused: No    Sexually Abused: No    Past Surgical History:  Procedure Laterality Date   ABDOMINAL HYSTERECTOMY     BACK SURGERY     BREAST BIOPSY Right ?`   benign   BREAST LUMPECTOMY Left 2015   breast ca   CHOLECYSTECTOMY     JOINT REPLACEMENT     LAPAROTOMY N/A 07/14/2020   Procedure: LAPAROTOMY;  Surgeon: Nadara Mustard, MD;  Location: [facility] ORS;  Service: Gynecology;  Laterality: N/A;   OOPHORECTOMY     OVARIAN CYST REMOVAL     PORTA CATH INSERTION N/A 07/27/2020   Procedure: PORTA CATH INSERTION;  Surgeon: Annice Needy, MD;  Location: [facility];  Service: Cardiovascular;  Laterality: N/A;   PORTA CATH REMOVAL N/A 04/05/2021   Procedure: PORTA CATH REMOVAL;  Surgeon: Annice Needy, MD;   Location: [facility];  Service: Cardiovascular;  Laterality: N/A;   REPLACEMENT TOTAL KNEE Right    SALPINGOOPHORECTOMY Bilateral 07/14/2020   Procedure: OPEN SALPINGO OOPHORECTOMY;  Surgeon: Nadara Mustard, MD;  Location: [facility] ORS;  Service: Gynecology;  Laterality: Bilateral;   TOTAL SHOULDER REPLACEMENT Left     Family History  Problem Relation Age of Onset   Diabetes Daughter    Hypertension Daughter    Fibromyalgia Daughter    GER disease Daughter    Fibromyalgia Daughter    Crohn's disease Daughter  Asthma Daughter    Hypertension Mother    Breast cancer Other    Breast cancer Niece    Uterine cancer Niece     No Known Allergies  Current Outpatient Medications on File Prior to Visit  Medication Sig Dispense Refill   Accu-Chek FastClix Lancets MISC USE AS INSTRUCTED TO TEST BLOOD SUGAR DAILY 102 each 1   albuterol (VENTOLIN HFA) 108 (90 Base) MCG/ACT inhaler Inhale 1-2 puffs into the lungs every 6 (six) hours as needed. 18 g 0   aspirin EC 325 MG tablet Take 325 mg by mouth daily.     b complex vitamins capsule Take 1 capsule by mouth daily.     blood glucose meter kit and supplies Dispense based on patient and insurance preference. Use up to 2 times daily as directed. (FOR ICD-10 E10.9, E11.9). 1 each 0   Blood Glucose Monitoring Suppl DEVI 1 each by Does not apply route in the morning, at noon, and at bedtime. May substitute to any manufacturer covered by patient's insurance. 1 each 0   botulinum toxin Type A (BOTOX) 200 units injection Inject 155 units IM into multiple sites of face head and neck every 90 days 1 each 4   Cranberry 1000 MG CAPS Take 1 capsule by mouth 2 (two) times daily.     donepezil (ARICEPT) 10 MG tablet Take 1 tablet (10 mg total) by mouth at bedtime. 90 tablet 3   dorzolamide-timolol (COSOPT) 22.3-6.8 MG/ML ophthalmic solution Place 1 drop into both eyes 2 (two) times daily.     DULoxetine (CYMBALTA) 60 MG capsule TAKE 1 CAPSULE EVERY DAY  FOR ANXIETY AND DEPRESSION. 90 capsule 2   fluticasone (FLONASE) 50 MCG/ACT nasal spray USE 2 SPRAYS IN EACH NOSTRIL DAILY AS NEEDED FOR ALLERGIES OR RHINITIS 48 g 2   gabapentin (NEURONTIN) 600 MG tablet TAKE 1 TABLET THREE TIMES DAILY FOR NEUROPATHY 270 tablet 2   Glucose Blood (BLOOD GLUCOSE TEST STRIPS) STRP 1 each by In Vitro route in the morning, at noon, and at bedtime. May substitute to any manufacturer covered by patient's insurance. 300 strip 0   Lancets Misc. MISC 1 each by Does not apply route in the morning, at noon, and at bedtime. May substitute to any manufacturer covered by patient's insurance. 300 each 0   latanoprost (XALATAN) 0.005 % ophthalmic solution Place 1 drop into both eyes at bedtime.     levothyroxine (SYNTHROID) 112 MCG tablet TAKE 1 TABLET EVERY MORNING MONDAY THROUGH SATURDAY; AND 1/2 TABLET ON SUNDAY. TAKE ON AN EMPTY STOMACH WITH WATER ONLY 78 tablet 1   meclizine (ANTIVERT) 25 MG tablet TAKE 1 TABLET (25 MG TOTAL) BY MOUTH 2 (TWO) TIMES DAILY AS NEEDED FOR DIZZINESS. 180 tablet 0   meloxicam (MOBIC) 15 MG tablet TAKE 1 TABLET EVERY DAY AS NEEDED FOR PAIN 90 tablet 0   memantine (NAMENDA) 5 MG tablet Take 1 tablet twice a day 180 tablet 3   metFORMIN (GLUCOPHAGE-XR) 500 MG 24 hr tablet Take 1 tablet (500 mg total) by mouth daily with breakfast. for diabetes. 90 tablet 1   Netarsudil Dimesylate (RHOPRESSA) 0.02 % SOLN Place 1 drop into both eyes daily at 6 (six) AM.     nystatin cream (MYCOSTATIN) Apply 1 Application topically 2 (two) times daily as needed. 30 g 2   omeprazole (PRILOSEC) 20 MG capsule TAKE 1 CAPSULE TWICE DAILY FOR HEARTBURN. 180 capsule 2   pilocarpine (PILOCAR) 2 % ophthalmic solution Place 1 drop into both eyes  4 (four) times daily.      propranolol (INDERAL) 80 MG tablet TAKE 1 TABLET EVERY DAY AS DIRECTED FOR HEADACHE PREVENTION. 90 tablet 2   rosuvastatin (CRESTOR) 10 MG tablet TAKE 1 TABLET EVERY DAY FOR CHOLESTEROL 90 tablet 2   SUMAtriptan  (IMITREX) 25 MG tablet Take 1 tablet (25 mg total) by mouth every 2 (two) hours as needed for migraine. May repeat in 2 hours if headache persists or recurs. 10 tablet 11   VITAMIN D PO Take by mouth.     No current facility-administered medications on file prior to visit.    BP (!) 140/82   Pulse 76   Temp 98.4 F (36.9 C) (Temporal)   Ht 5\' 4"  (1.626 m)   Wt 163 lb (73.9 kg)   SpO2 97%   BMI 27.98 kg/m  Objective:   Physical Exam Cardiovascular:     Rate and Rhythm: Normal rate and regular rhythm.  Pulmonary:     Effort: Pulmonary effort is normal.     Breath sounds: Normal breath sounds.  Musculoskeletal:     Cervical back: Neck supple.  Skin:    General: Skin is warm and dry.           Assessment & Plan:  Type 2 diabetes mellitus with peripheral neuropathy (HCC) Assessment & Plan: Controlled with A1c of 6.2 today.  Given her enjoyment of sweets, we will continue metformin XR 500 mg daily.  She and her family agree.  Foot exam today.  Follow-up in 6 months.  Orders: -     POCT glycosylated hemoglobin (Hb A1C)  Insomnia, unspecified type Assessment & Plan: Uncontrolled despite high doses of trazodone.  Will wean off trazodone gradually. Start mirtazapine 15 mg at bedtime.  Discussed that this may increase appetite.  Her daughters will update in a few weeks.  Orders: -     Mirtazapine; Take 1 tablet (15 mg total) by mouth at bedtime. For sleep  Dispense: 90 tablet; Refill: 0        Doreene Nest, NP

## 2022-11-29 NOTE — Assessment & Plan Note (Signed)
Controlled with A1c of 6.2 today.  Given her enjoyment of sweets, we will continue metformin XR 500 mg daily.  She and her family agree.  Foot exam today.  Follow-up in 6 months.

## 2022-11-29 NOTE — Assessment & Plan Note (Addendum)
Uncontrolled despite high doses of trazodone.  Will wean off trazodone gradually. Start mirtazapine 15 mg at bedtime.  Discussed that this may increase appetite.  Her daughters will update in a few weeks.

## 2022-11-29 NOTE — Patient Instructions (Addendum)
Reduce trazodone to 150 mg nightly x 1 week, then reduce to 100 mg nightly x 1 week, then reduce to 50 mg nightly x 1 week, then stop.  Start mirtazapine 15 mg at bedtime for sleep.  Start with 1/2 tablet by mouth nightly x 1 week, then increase to 1 full tablet nightly thereafter.  Please schedule a physical to meet with me in 6 months.   It was a pleasure to see you today!

## 2022-12-11 ENCOUNTER — Other Ambulatory Visit: Payer: Self-pay | Admitting: Primary Care

## 2022-12-11 DIAGNOSIS — E1142 Type 2 diabetes mellitus with diabetic polyneuropathy: Secondary | ICD-10-CM

## 2022-12-20 ENCOUNTER — Encounter: Payer: Self-pay | Admitting: Primary Care

## 2022-12-20 ENCOUNTER — Ambulatory Visit (INDEPENDENT_AMBULATORY_CARE_PROVIDER_SITE_OTHER): Payer: Medicare HMO | Admitting: Primary Care

## 2022-12-20 VITALS — BP 134/88 | HR 67 | Temp 97.6°F | Ht 64.0 in | Wt 163.0 lb

## 2022-12-20 DIAGNOSIS — R8290 Unspecified abnormal findings in urine: Secondary | ICD-10-CM

## 2022-12-20 LAB — POC URINALSYSI DIPSTICK (AUTOMATED)
Glucose, UA: NEGATIVE
Nitrite, UA: POSITIVE
Protein, UA: POSITIVE — AB
Spec Grav, UA: 1.02 (ref 1.010–1.025)
Urobilinogen, UA: 0.2 E.U./dL
pH, UA: 5 (ref 5.0–8.0)

## 2022-12-20 MED ORDER — NITROFURANTOIN MONOHYD MACRO 100 MG PO CAPS
100.0000 mg | ORAL_CAPSULE | Freq: Two times a day (BID) | ORAL | 0 refills | Status: AC
Start: 2022-12-20 — End: 2022-12-27

## 2022-12-20 NOTE — Assessment & Plan Note (Addendum)
Urinalysis today with 3+ leuks, positive nitrites, 1+ blood. Culture ordered and pending.  Given history of cystitis, coupled with these findings, we will treat for presumed infection.  Start Macrobid (nitrofurantoin) tablets for urinary tract infection. Take 1 tablet by mouth twice daily for 7 days.  There was an interaction with her metformin and Bactrim tablets.  Follow-up as needed.

## 2022-12-20 NOTE — Patient Instructions (Signed)
Start Macrobid (nitrofurantoin) tablets for urinary tract infection. Take 1 tablet by mouth twice daily for 7 days.  Increase water intake.  Follow-up if symptoms do not improve.  We will be in touch as soon as we receive the urine culture results.  It was a pleasure to see you today!

## 2022-12-20 NOTE — Progress Notes (Signed)
Subjective:    Patient ID: Shannon Higgins, female    DOB: 07-Jun-1934, 88 y.o.   MRN: [id number]  Dysuria  Pertinent negatives include no flank pain, frequency, hematuria or urgency.    Shannon Higgins is a very pleasant 88 y.o. female with a history of acute cystitis, hypertension, type 2 diabetes, dementia, ovarian cancer, hyperlipidemia, neuropathic pain who presents today to discuss foul smelling urine.  Symptoms began about 3 days ago with foul smelling urine, pelvic pressure. She denies fevers, abdominal pain, urinary frequency, hematuria, vaginal itching/discharge. She's taken Tylenol with some improvement. She drinks 1 bottle of water daily.   Her last UTI was on 11/08/22, culture positive with E coli without resistance.  Symptoms resolved after antibiotic treatment.    Review of Systems  Constitutional:  Negative for fever.  Gastrointestinal:  Negative for abdominal pain.  Genitourinary:  Positive for pelvic pain. Negative for dysuria, flank pain, frequency, hematuria, urgency and vaginal discharge.       Foul-smelling urine         Past Medical History:  Diagnosis Date   Acute bronchitis 05/13/2022   Acute metabolic encephalopathy 04/23/2020   Acute tubular injury of transplanted kidney (HCC) 06/14/2016   AKI (acute kidney injury) (HCC) 06/14/2016   Asthma    Breast cancer (HCC) 2015   left   Bronchitis    CAP (community acquired pneumonia) 04/29/2012   Chronic sinusitis    Closed fracture of medial malleolus 05/11/2018   Confusion 12/06/2016   COVID-19 virus infection 12/17/2020   Diabetes mellitus without complication (HCC)    Family history of breast cancer    GERD (gastroesophageal reflux disease)    History of ankle fracture 05/14/2018   Last Assessment & Plan:  With a recent fall. Right medial malleous. Has ortho follow up soon, splinted now and tylenol helps her pain   History of recurrent UTIs    Hypertension    Hypokalemia 04/26/2012    Hyponatremia    Hypothyroidism    Imbalance 09/02/2019   Insomnia    Migraine    Neuropathic pain    Ovarian cancer (HCC) 2022   Ovarian mass, left 07/14/2020   Personal history of breast cancer    Personal history of chemotherapy current   bilateral ovarian ca   Pneumonia    Rheumatoid arthritis (HCC)    Sepsis (HCC) 04/26/2012   Sepsis secondary to UTI (HCC) 04/26/2012   Sleep apnea    Stroke [facility])    seen in CT scan   Thyroid disease    Transient hypotension 06/14/2016    Social History   Socioeconomic History   Marital status: Widowed    Spouse name: Not on file   Number of children: 2   Years of education: Not on file   Highest education level: Some college, no degree  Occupational History   Occupation: retired   Tobacco Use   Smoking status: Never   Smokeless tobacco: Never  Vaping Use   Vaping status: Never Used  Substance and Sexual Activity   Alcohol use: No   Drug use: No   Sexual activity: Not Currently  Other Topics Concern   Not on file  Social History Narrative   Pt lives in 1 story home with her daughter, [name] and [name]'s husband   Has 2 adult daughters   Highest level of education: some college   Worked mainly as Environmental health practitioner.   Social Determinants of Health   Financial Resource Strain: Low Risk  (  08/24/2022)   Overall Financial Resource Strain (CARDIA)    Difficulty of Paying Living Expenses: Not hard at all  Food Insecurity: No Food Insecurity (08/24/2022)   Hunger Vital Sign    Worried About Running Out of Food in the Last Year: Never true    Ran Out of Food in the Last Year: Never true  Transportation Needs: No Transportation Needs (08/24/2022)   PRAPARE - Administrator, Civil Service (Medical): No    Lack of Transportation (Non-Medical): No  Physical Activity: Inactive (08/24/2022)   Exercise Vital Sign    Days of Exercise per Week: 0 days    Minutes of Exercise per Session: 0 min  Stress: No Stress Concern  Present (08/24/2022)   [employer] of Occupational Health - Occupational Stress Questionnaire    Feeling of Stress : Not at all  Social Connections: Socially Isolated (08/24/2022)   Social Connection and Isolation Panel [NHANES]    Frequency of Communication with Friends and Family: More than three times a week    Frequency of Social Gatherings with Friends and Family: More than three times a week    Attends Religious Services: Never    Database administrator or Organizations: No    Attends Banker Meetings: Never    Marital Status: Widowed  Intimate Partner Violence: Not At Risk (03/01/2022)   Humiliation, Afraid, Rape, and Kick questionnaire    Fear of Current or Ex-Partner: No    Emotionally Abused: No    Physically Abused: No    Sexually Abused: No    Past Surgical History:  Procedure Laterality Date   ABDOMINAL HYSTERECTOMY     BACK SURGERY     BREAST BIOPSY Right ?`   benign   BREAST LUMPECTOMY Left 2015   breast ca   CHOLECYSTECTOMY     JOINT REPLACEMENT     LAPAROTOMY N/A 07/14/2020   Procedure: LAPAROTOMY;  Surgeon: Nadara Mustard, MD;  Location: [facility] ORS;  Service: Gynecology;  Laterality: N/A;   OOPHORECTOMY     OVARIAN CYST REMOVAL     PORTA CATH INSERTION N/A 07/27/2020   Procedure: PORTA CATH INSERTION;  Surgeon: Annice Needy, MD;  Location: [facility];  Service: Cardiovascular;  Laterality: N/A;   PORTA CATH REMOVAL N/A 04/05/2021   Procedure: PORTA CATH REMOVAL;  Surgeon: Annice Needy, MD;  Location: [facility];  Service: Cardiovascular;  Laterality: N/A;   REPLACEMENT TOTAL KNEE Right    SALPINGOOPHORECTOMY Bilateral 07/14/2020   Procedure: OPEN SALPINGO OOPHORECTOMY;  Surgeon: Nadara Mustard, MD;  Location: [facility];  Service: Gynecology;  Laterality: Bilateral;   TOTAL SHOULDER REPLACEMENT Left     Family History  Problem Relation Age of Onset   Diabetes Daughter    Hypertension Daughter    Fibromyalgia Daughter     GER disease Daughter    Fibromyalgia Daughter    Crohn's disease Daughter    Asthma Daughter    Hypertension Mother    Breast cancer Other    Breast cancer Niece    Uterine cancer Niece     No Known Allergies  Current Outpatient Medications on File Prior to Visit  Medication Sig Dispense Refill   Accu-Chek FastClix Lancets MISC USE AS INSTRUCTED TO TEST BLOOD SUGAR DAILY 102 each 1   ACCU-CHEK GUIDE test strip 1 EACH BY IN VITRO ROUTE IN THE MORNING, AT NOON, AND AT BEDTIME. MAY SUBSTITUTE TO ANY MANUFACTURER COVERED BY PATIENT'S INSURANCE. 300  strip 0   albuterol (VENTOLIN HFA) 108 (90 Base) MCG/ACT inhaler Inhale 1-2 puffs into the lungs every 6 (six) hours as needed. 18 g 0   aspirin EC 325 MG tablet Take 325 mg by mouth daily.     b complex vitamins capsule Take 1 capsule by mouth daily.     blood glucose meter kit and supplies Dispense based on patient and insurance preference. Use up to 2 times daily as directed. (FOR ICD-10 E10.9, E11.9). 1 each 0   Blood Glucose Monitoring Suppl DEVI 1 each by Does not apply route in the morning, at noon, and at bedtime. May substitute to any manufacturer covered by patient's insurance. 1 each 0   botulinum toxin Type A (BOTOX) 200 units injection Inject 155 units IM into multiple sites of face head and neck every 90 days 1 each 4   Cranberry 1000 MG CAPS Take 1 capsule by mouth 2 (two) times daily.     donepezil (ARICEPT) 10 MG tablet Take 1 tablet (10 mg total) by mouth at bedtime. 90 tablet 3   dorzolamide-timolol (COSOPT) 22.3-6.8 MG/ML ophthalmic solution Place 1 drop into both eyes 2 (two) times daily.     DULoxetine (CYMBALTA) 60 MG capsule TAKE 1 CAPSULE EVERY DAY FOR ANXIETY AND DEPRESSION. 90 capsule 2   fluticasone (FLONASE) 50 MCG/ACT nasal spray USE 2 SPRAYS IN EACH NOSTRIL DAILY AS NEEDED FOR ALLERGIES OR RHINITIS 48 g 2   gabapentin (NEURONTIN) 600 MG tablet TAKE 1 TABLET THREE TIMES DAILY FOR NEUROPATHY 270 tablet 2   Lancets  Misc. MISC 1 each by Does not apply route in the morning, at noon, and at bedtime. May substitute to any manufacturer covered by patient's insurance. 300 each 0   latanoprost (XALATAN) 0.005 % ophthalmic solution Place 1 drop into both eyes at bedtime.     levothyroxine (SYNTHROID) 112 MCG tablet TAKE 1 TABLET EVERY MORNING MONDAY THROUGH SATURDAY; AND 1/2 TABLET ON SUNDAY. TAKE ON AN EMPTY STOMACH WITH WATER ONLY 78 tablet 1   meclizine (ANTIVERT) 25 MG tablet TAKE 1 TABLET (25 MG TOTAL) BY MOUTH 2 (TWO) TIMES DAILY AS NEEDED FOR DIZZINESS. 180 tablet 0   meloxicam (MOBIC) 15 MG tablet TAKE 1 TABLET EVERY DAY AS NEEDED FOR PAIN 90 tablet 0   memantine (NAMENDA) 5 MG tablet Take 1 tablet twice a day 180 tablet 3   metFORMIN (GLUCOPHAGE-XR) 500 MG 24 hr tablet Take 1 tablet (500 mg total) by mouth daily with breakfast. for diabetes. 90 tablet 1   mirtazapine (REMERON) 15 MG tablet Take 1 tablet (15 mg total) by mouth at bedtime. For sleep 90 tablet 0   Netarsudil Dimesylate (RHOPRESSA) 0.02 % SOLN Place 1 drop into both eyes daily at 6 (six) AM.     nystatin cream (MYCOSTATIN) Apply 1 Application topically 2 (two) times daily as needed. 30 g 2   omeprazole (PRILOSEC) 20 MG capsule TAKE 1 CAPSULE TWICE DAILY FOR HEARTBURN. 180 capsule 2   pilocarpine (PILOCAR) 2 % ophthalmic solution Place 1 drop into both eyes 4 (four) times daily.      propranolol (INDERAL) 80 MG tablet TAKE 1 TABLET EVERY DAY AS DIRECTED FOR HEADACHE PREVENTION. 90 tablet 2   rosuvastatin (CRESTOR) 10 MG tablet TAKE 1 TABLET EVERY DAY FOR CHOLESTEROL 90 tablet 2   SUMAtriptan (IMITREX) 25 MG tablet Take 1 tablet (25 mg total) by mouth every 2 (two) hours as needed for migraine. May repeat in 2 hours if headache  persists or recurs. 10 tablet 11   VITAMIN D PO Take by mouth.     No current facility-administered medications on file prior to visit.    BP 134/88   Pulse 67   Temp 97.6 F (36.4 C) (Temporal)   Ht 5\' 4"  (1.626 m)    Wt 163 lb (73.9 kg)   SpO2 96%   BMI 27.98 kg/m  Objective:   Physical Exam Cardiovascular:     Rate and Rhythm: Normal rate and regular rhythm.  Pulmonary:     Effort: Pulmonary effort is normal.     Breath sounds: Normal breath sounds.  Abdominal:     Tenderness: There is no abdominal tenderness. There is no right CVA tenderness or left CVA tenderness.  Musculoskeletal:     Cervical back: Neck supple.  Skin:    General: Skin is warm and dry.           Assessment & Plan:  Foul smelling urine Assessment & Plan: Urinalysis today with 3+ leuks, positive nitrites, 1+ blood. Culture ordered and pending.  Given history of cystitis, coupled with these findings, we will treat for presumed infection.  Start Macrobid (nitrofurantoin) tablets for urinary tract infection. Take 1 tablet by mouth twice daily for 7 days.  There was an interaction with her metformin and Bactrim tablets.  Follow-up as needed.  Orders: -     POCT Urinalysis Dipstick (Automated) -     Urine Culture -     Nitrofurantoin Monohyd Macro; Take 1 capsule (100 mg total) by mouth 2 (two) times daily for 7 days.  Dispense: 14 capsule; Refill: 0        Doreene Nest, NP

## 2022-12-23 LAB — URINE CULTURE
MICRO NUMBER:: 15355776
SPECIMEN QUALITY:: ADEQUATE

## 2022-12-28 ENCOUNTER — Other Ambulatory Visit: Payer: Self-pay

## 2022-12-28 MED ORDER — MEMANTINE HCL 5 MG PO TABS: ORAL_TABLET | ORAL | 0 refills | Status: DC

## 2022-12-28 MED ORDER — SUMATRIPTAN SUCCINATE 25 MG PO TABS: 25.0000 mg | ORAL_TABLET | ORAL | 2 refills | Status: DC | PRN

## 2022-12-30 ENCOUNTER — Telehealth: Payer: Self-pay | Admitting: Primary Care

## 2022-12-30 NOTE — Telephone Encounter (Signed)
Patient was seen for an UTI on 12/20/2022,and given an antibiotic Macrobid .She has completed the course of antibiotics but states that she still has pain in her vaginal area,and would like to know if another course of antibiotics could be sent in for her?   [id number] - [place], [place] - [address] AT [address] Phone: [phone]  Fax: [phone]

## 2022-12-30 NOTE — Telephone Encounter (Signed)
Called and spoke with patient daughter, advised of Matts message. Patients daughter advised she will talk with the patient and find out if she wants to schedule appt for next week at our office or go to [facility] over the weekend. Advised to call us back if they want to scheduled appt here

## 2022-12-30 NOTE — Telephone Encounter (Signed)
Looking at the last note and the urine culture the antibiotic should have taken care of the infection. Since kate is out of office I recommend that she be re-evaluated

## 2023-01-03 NOTE — Telephone Encounter (Signed)
Looks like has been addressed on day of call.

## 2023-01-03 NOTE — Telephone Encounter (Signed)
Noted and agree for re-evaluation.

## 2023-01-04 DIAGNOSIS — R300 Dysuria: Secondary | ICD-10-CM | POA: Diagnosis not present

## 2023-01-04 DIAGNOSIS — N390 Urinary tract infection, site not specified: Secondary | ICD-10-CM | POA: Diagnosis not present

## 2023-01-18 ENCOUNTER — Encounter: Payer: Self-pay | Admitting: Neurology

## 2023-01-18 ENCOUNTER — Ambulatory Visit: Payer: Medicare HMO | Admitting: Neurology

## 2023-01-18 VITALS — BP 162/88 | HR 78 | Ht 64.0 in | Wt 166.2 lb

## 2023-01-18 DIAGNOSIS — G43709 Chronic migraine without aura, not intractable, without status migrainosus: Secondary | ICD-10-CM | POA: Diagnosis not present

## 2023-01-18 DIAGNOSIS — F03A0 Unspecified dementia, mild, without behavioral disturbance, psychotic disturbance, mood disturbance, and anxiety: Secondary | ICD-10-CM | POA: Diagnosis not present

## 2023-01-18 MED ORDER — DONEPEZIL HCL 10 MG PO TABS
10.0000 mg | ORAL_TABLET | Freq: Every day | ORAL | 3 refills | Status: DC
Start: 2023-01-18 — End: 2023-03-23

## 2023-01-18 MED ORDER — MEMANTINE HCL 5 MG PO TABS: ORAL_TABLET | ORAL | 3 refills | Status: DC

## 2023-01-18 NOTE — Patient Instructions (Signed)
Good to see you! Continue all your medications. Follow-up with Dr. Everlena Cooper as scheduled. Follow-up with me in 1 year, call for any changes.  FALL PRECAUTIONS: Be cautious when walking. Scan the area for obstacles that may increase the risk of trips and falls. When getting up in the mornings, sit up at the edge of the bed for a few minutes before getting out of bed. Consider elevating the bed at the head end to avoid drop of blood pressure when getting up. Walk always in a well-lit room (use night lights in the walls). Avoid area rugs or power cords from appliances in the middle of the walkways. Use a walker or a cane if necessary and consider physical therapy for balance exercise. Get your eyesight checked regularly.   HOME SAFETY: Consider the safety of the kitchen when operating appliances like stoves, microwave oven, and blender. Consider having supervision and share cooking responsibilities until no longer able to participate in those. Accidents with firearms and other hazards in the house should be identified and addressed as well.   ABILITY TO BE LEFT ALONE: If patient is unable to contact 911 operator, consider using LifeLine, or when the need is there, arrange for someone to stay with patients. Smoking is a fire hazard, consider supervision or cessation. Risk of wandering should be assessed by caregiver and if detected at any point, supervision and safe proof recommendations should be instituted.  MEDICATION SUPERVISION: Inability to self-administer medication needs to be constantly addressed. Implement a mechanism to ensure safe administration of the medications.  RECOMMENDATIONS FOR ALL PATIENTS WITH MEMORY PROBLEMS: 1. Continue to exercise (Recommend 30 minutes of walking everyday, or 3 hours every week) 2. Increase social interactions - continue going to [place] and enjoy social gatherings with friends and family 3. Eat healthy, avoid fried foods and eat more fruits and vegetables 4.  Maintain adequate blood pressure, blood sugar, and blood cholesterol level. Reducing the risk of stroke and cardiovascular disease also helps promoting better memory. 5. Avoid stressful situations. Live a simple life and avoid aggravations. Organize your time and prepare for the next day in anticipation. 6. Sleep well, avoid any interruptions of sleep and avoid any distractions in the bedroom that may interfere with adequate sleep quality 7. Avoid sugar, avoid sweets as there is a strong link between excessive sugar intake, diabetes, and cognitive impairment The Mediterranean diet has been shown to help patients reduce the risk of progressive memory disorders and reduces cardiovascular risk. This includes eating fish, eat fruits and green leafy vegetables, nuts like almonds and hazelnuts, walnuts, and also use olive oil. Avoid fast foods and fried foods as much as possible. Avoid sweets and sugar as sugar use has been linked to worsening of memory function.  There is always a concern of gradual progression of memory problems. If this is the case, then we may need to adjust level of care according to patient needs. Support, both to the patient and caregiver, should then be put into place.

## 2023-01-18 NOTE — Progress Notes (Signed)
NEUROLOGY FOLLOW UP OFFICE NOTE  [name] [id number] [birth year]  HISTORY OF PRESENT ILLNESS: I had the pleasure of seeing Shannon Higgins in follow-up in the neurology clinic on 01/18/2023.  The patient was last seen a year ago for migraines and mild dementia. She is again accompanied by her daughter Shannon Higgins who helps supplement the history today today.  Records and images were personally reviewed where available.  On her last visit, family reported worsening memory. MMSE 28/30. Memantine 5mg  BID was added to Donepezil 10mg  daily. She thinks her memory is good. Shannon Higgins notes more short-term memory changes, she forgets more quickly now. She manages her own medications and may forget, Shannon Higgins makes sure she takes them. [employer]. She is independent with dressing and bathing. She continues to do well with Botox, it has been months since her last migraine. Neuropathy is stable, no change in balance issues, when walking she "starts dancing." She does not always use her walker. She is on Gabapentin 600mg  TID. No falls in the past 6 months.    History on Initial Assessment 02/08/2017: This is a 88 year old right-handed woman with a history of hypertension, diabetes, rheumatoid arthritis, breast cancer, presenting for evaluation of chronic headaches and confusion.    1. Headaches Headaches started in her 30s. She went for a period of time with no significant headaches for a year, then last June headaches recurred lasting for weeks at a time. She has 20 to 25 headache days a month, with nausea, upset stomach, sometimes diarrhea. She describes a lot of pressure and throbbing. She had a headache that lasted for 2 months, resolving 2 weeks ago, then this weekend headaches started back and have been ongoing for 4-5 days. She denies any visual obscurations, no photo/phonophobia. She does not usually have dizziness with the headaches, but recently has been so dizzy she had difficulty getting to the bathroom the  past week, with described spinning sensation. Meclizine helps some. She has had vertigo in the past. No family history of migraines. She has tried several headache preventative medications, including amitriptyline, Topiramate. She is taking Propranolol 80mg  daily with no side effects. She is also on Neurontin for back pain and Cymbalta for mood.    2. Confusion. She reports word-finding difficulties for the past 6 months. Her daughter mostly describes the episodic confusion as forgetting what she had previously told the patient. She has been taking an old meclizine prescription, but forgot that her daughter told her this information. She has not been driving for the past month due to glaucoma, denied previously getting lost driving. After her husband passed away 1.5 years ago, she moved in with her daughter and son-in-law. They are in charge of finances. Her daughter states she forgets her medications. One time she forgot to take her Cymbalta for a week. No paranoia or hallucinations. She used to be a social butterfly but now likes to be more at home. There is a strong family history of dementia in her mother and multiple siblings.    She denies any diplopia, dysarthria/dysphagia, anosmia, or tremors. She has chronic neck and back pain and neuropathy in both feet radiating up her legs. She has a little urinary incontinence, no bowel issues. They report several falls with the dizziness. After she hit her head with fall in July, she had a bad headache for 6 weeks. She was veering to the left and went to the ER on 7/13 where head CT did not show any acute  changes. She got a migraine cocktail but headache was still bad the next day.   MRI/MRA brain without contrast 08/2018: 1. No acute intracranial abnormality. 2. Chronic ischemic microangiopathy. 3. Multifocal severe stenosis and/or short segment occlusion of the right posterior cerebral artery. Otherwise normal intracranial MRA.    PAST MEDICAL  HISTORY: Past Medical History:  Diagnosis Date   Acute bronchitis 05/13/2022   Acute metabolic encephalopathy 04/23/2020   Acute tubular injury of transplanted kidney (HCC) 06/14/2016   AKI (acute kidney injury) (HCC) 06/14/2016   Asthma    Breast cancer (HCC) 2015   left   Bronchitis    CAP (community acquired pneumonia) 04/29/2012   Chronic sinusitis    Closed fracture of medial malleolus 05/11/2018   Confusion 12/06/2016   COVID-19 virus infection 12/17/2020   Diabetes mellitus without complication (HCC)    Family history of breast cancer    GERD (gastroesophageal reflux disease)    History of ankle fracture 05/14/2018   Last Assessment & Plan:  With a recent fall. Right medial malleous. Has ortho follow up soon, splinted now and tylenol helps her pain   History of recurrent UTIs    Hypertension    Hypokalemia 04/26/2012   Hyponatremia    Hypothyroidism    Imbalance 09/02/2019   Insomnia    Migraine    Neuropathic pain    Ovarian cancer (HCC) 2022   Ovarian mass, left 07/14/2020   Personal history of breast cancer    Personal history of chemotherapy current   bilateral ovarian ca   Pneumonia    Rheumatoid arthritis (HCC)    Sepsis (HCC) 04/26/2012   Sepsis secondary to UTI (HCC) 04/26/2012   Sleep apnea    Stroke [facility])    seen in CT scan   Thyroid disease    Transient hypotension 06/14/2016    MEDICATIONS: Current Outpatient Medications on File Prior to Visit  Medication Sig Dispense Refill   Accu-Chek FastClix Lancets MISC USE AS INSTRUCTED TO TEST BLOOD SUGAR DAILY 102 each 1   ACCU-CHEK GUIDE test strip 1 EACH BY IN VITRO ROUTE IN THE MORNING, AT NOON, AND AT BEDTIME. MAY SUBSTITUTE TO ANY MANUFACTURER COVERED BY PATIENT'S INSURANCE. 300 strip 0   albuterol (VENTOLIN HFA) 108 (90 Base) MCG/ACT inhaler Inhale 1-2 puffs into the lungs every 6 (six) hours as needed. 18 g 0   aspirin EC 325 MG tablet Take 325 mg by mouth daily.     b complex vitamins capsule  Take 1 capsule by mouth daily.     blood glucose meter kit and supplies Dispense based on patient and insurance preference. Use up to 2 times daily as directed. (FOR ICD-10 E10.9, E11.9). 1 each 0   Blood Glucose Monitoring Suppl DEVI 1 each by Does not apply route in the morning, at noon, and at bedtime. May substitute to any manufacturer covered by patient's insurance. 1 each 0   botulinum toxin Type A (BOTOX) 200 units injection Inject 155 units IM into multiple sites of face head and neck every 90 days 1 each 4   Cranberry 1000 MG CAPS Take 1 capsule by mouth 2 (two) times daily.     donepezil (ARICEPT) 10 MG tablet Take 1 tablet (10 mg total) by mouth at bedtime. 90 tablet 3   dorzolamide-timolol (COSOPT) 22.3-6.8 MG/ML ophthalmic solution Place 1 drop into both eyes 2 (two) times daily.     DULoxetine (CYMBALTA) 60 MG capsule TAKE 1 CAPSULE EVERY DAY FOR ANXIETY AND DEPRESSION.  90 capsule 2   fluticasone (FLONASE) 50 MCG/ACT nasal spray USE 2 SPRAYS IN EACH NOSTRIL DAILY AS NEEDED FOR ALLERGIES OR RHINITIS 48 g 2   gabapentin (NEURONTIN) 600 MG tablet TAKE 1 TABLET THREE TIMES DAILY FOR NEUROPATHY 270 tablet 2   Lancets Misc. MISC 1 each by Does not apply route in the morning, at noon, and at bedtime. May substitute to any manufacturer covered by patient's insurance. 300 each 0   latanoprost (XALATAN) 0.005 % ophthalmic solution Place 1 drop into both eyes at bedtime.     levothyroxine (SYNTHROID) 112 MCG tablet TAKE 1 TABLET EVERY MORNING MONDAY THROUGH SATURDAY; AND 1/2 TABLET ON SUNDAY. TAKE ON AN EMPTY STOMACH WITH WATER ONLY 78 tablet 1   meclizine (ANTIVERT) 25 MG tablet TAKE 1 TABLET (25 MG TOTAL) BY MOUTH 2 (TWO) TIMES DAILY AS NEEDED FOR DIZZINESS. 180 tablet 0   meloxicam (MOBIC) 15 MG tablet TAKE 1 TABLET EVERY DAY AS NEEDED FOR PAIN 90 tablet 0   memantine (NAMENDA) 5 MG tablet Take 1 tablet twice a day 180 tablet 0   metFORMIN (GLUCOPHAGE-XR) 500 MG 24 hr tablet Take 1 tablet (500  mg total) by mouth daily with breakfast. for diabetes. 90 tablet 1   mirtazapine (REMERON) 15 MG tablet Take 1 tablet (15 mg total) by mouth at bedtime. For sleep 90 tablet 0   Netarsudil Dimesylate (RHOPRESSA) 0.02 % SOLN Place 1 drop into both eyes daily at 6 (six) AM.     nystatin cream (MYCOSTATIN) Apply 1 Application topically 2 (two) times daily as needed. 30 g 2   omeprazole (PRILOSEC) 20 MG capsule TAKE 1 CAPSULE TWICE DAILY FOR HEARTBURN. 180 capsule 2   pilocarpine (PILOCAR) 2 % ophthalmic solution Place 1 drop into both eyes 4 (four) times daily.      propranolol (INDERAL) 80 MG tablet TAKE 1 TABLET EVERY DAY AS DIRECTED FOR HEADACHE PREVENTION. 90 tablet 2   rosuvastatin (CRESTOR) 10 MG tablet TAKE 1 TABLET EVERY DAY FOR CHOLESTEROL 90 tablet 2   SUMAtriptan (IMITREX) 25 MG tablet Take 1 tablet (25 mg total) by mouth every 2 (two) hours as needed for migraine. May repeat in 2 hours if headache persists or recurs. 10 tablet 2   VITAMIN D PO Take by mouth.     No current facility-administered medications on file prior to visit.    ALLERGIES: No Known Allergies  FAMILY HISTORY: Family History  Problem Relation Age of Onset   Diabetes Daughter    Hypertension Daughter    Fibromyalgia Daughter    GER disease Daughter    Fibromyalgia Daughter    Crohn's disease Daughter    Asthma Daughter    Hypertension Mother    Breast cancer Other    Breast cancer Niece    Uterine cancer Niece     SOCIAL HISTORY: Social History   Socioeconomic History   Marital status: Widowed    Spouse name: Not on file   Number of children: 2   Years of education: Not on file   Highest education level: Some college, no degree  Occupational History   Occupation: retired   Tobacco Use   Smoking status: Never   Smokeless tobacco: Never  Vaping Use   Vaping status: Never Used  Substance and Sexual Activity   Alcohol use: No   Drug use: No   Sexual activity: Not Currently  Other Topics  Concern   Not on file  Social History Narrative   Pt lives  in 1 story home with her daughter, Shannon Higgins and Shannon Higgins's husband   Has 2 adult daughters   Highest level of education: some college   Worked mainly as Environmental health practitioner.   Social Determinants of Health   Financial Resource Strain: Low Risk  (08/24/2022)   Overall Financial Resource Strain (CARDIA)    Difficulty of Paying Living Expenses: Not hard at all  Food Insecurity: No Food Insecurity (08/24/2022)   Hunger Vital Sign    Worried About Running Out of Food in the Last Year: Never true    Ran Out of Food in the Last Year: Never true  Transportation Needs: No Transportation Needs (08/24/2022)   PRAPARE - Administrator, Civil Service (Medical): No    Lack of Transportation (Non-Medical): No  Physical Activity: Inactive (08/24/2022)   Exercise Vital Sign    Days of Exercise per Week: 0 days    Minutes of Exercise per Session: 0 min  Stress: No Stress Concern Present (08/24/2022)   [employer] of Occupational Health - Occupational Stress Questionnaire    Feeling of Stress : Not at all  Social Connections: Socially Isolated (08/24/2022)   Social Connection and Isolation Panel [NHANES]    Frequency of Communication with Friends and Family: More than three times a week    Frequency of Social Gatherings with Friends and Family: More than three times a week    Attends Religious Services: Never    Database administrator or Organizations: No    Attends Banker Meetings: Never    Marital Status: Widowed  Intimate Partner Violence: Not At Risk (03/01/2022)   Humiliation, Afraid, Rape, and Kick questionnaire    Fear of Current or Ex-Partner: No    Emotionally Abused: No    Physically Abused: No    Sexually Abused: No     PHYSICAL EXAM: Vitals:   01/18/23 1419  BP: (!) 162/88  Pulse: 78  SpO2: 92%   General: No acute distress Head:  Normocephalic/atraumatic Skin/Extremities: No rash, no  edema Neurological Exam: alert and oriented to person, place, and time. No aphasia or dysarthria. Fund of knowledge is appropriate.  Recent and remote memory are intact, 3/3 delayed recall.  Attention and concentration are normal, 5/5 WORLD backwards.   Cranial nerves: Pupils equal, round. Extraocular movements intact with no nystagmus. Visual fields full.  No facial asymmetry.  Motor: Bulk and tone normal, muscle strength 5/5 throughout with no pronator drift.   Finger to nose testing intact.  Gait slow and cautious, no ataxia.    IMPRESSION: This is an 88 yo RH woman with a history of hypertension, diabetes, rheumatoid arthritis, breast cancer, with chronic migraines and mild dementia. She continues to have good migraine response to Botox, continue follow-up with Dr. Everlena Cooper. She has prn sumatriptan for rescue. Neuropathy stable, Propranolol and Gabapentin refilled by her PCP. Family notes more forgetfulness but overall stable, continue Memantine 5mg  BID in addition to Donepezil 10mg  daily.Continue close supervision. We again discussed the importance of control of vascular risk factors, physical and brain stimulation exercises, MIND diet for overall brain health. She does not drive. Follow-up in 1 year, call for any changes.    Thank you for allowing me to participate in her care.  Please do not hesitate to call for any questions or concerns.    Patrcia Dolly, M.D.   CC: Vernona Rieger, NP

## 2023-01-19 ENCOUNTER — Other Ambulatory Visit: Payer: Self-pay | Admitting: Primary Care

## 2023-01-19 ENCOUNTER — Ambulatory Visit (INDEPENDENT_AMBULATORY_CARE_PROVIDER_SITE_OTHER): Payer: Medicare HMO | Admitting: Primary Care

## 2023-01-19 ENCOUNTER — Encounter: Payer: Self-pay | Admitting: Primary Care

## 2023-01-19 VITALS — BP 116/80 | HR 63 | Temp 97.4°F | Ht 64.0 in | Wt 162.0 lb

## 2023-01-19 DIAGNOSIS — R102 Pelvic and perineal pain: Secondary | ICD-10-CM

## 2023-01-19 DIAGNOSIS — M545 Low back pain, unspecified: Secondary | ICD-10-CM

## 2023-01-19 NOTE — Assessment & Plan Note (Signed)
Unclear etiology at this point.  Differentials include cystitis, cystocele, pelvic mass, rectocele.  She was unable to provide a urine specimen today, so she will return when able. She will see her oncologist tomorrow.  Consider transvaginal/pelvic ultrasound versus CT scan, especially given her history.  We also discussed gynecology evaluation for potential cystocele if needed.  Await results.

## 2023-01-19 NOTE — Patient Instructions (Signed)
Return the urine specimen as discussed.  It was a pleasure to see you today!

## 2023-01-19 NOTE — Progress Notes (Signed)
Subjective:    Patient ID: Shannon Higgins, female    DOB: Apr 20, 1935, 88 y.o.   MRN: [id number]  HPI  Shannon Higgins is a very pleasant 88 y.o. female with a significant medical history including type 2 diabetes, hypothyroidism, high-grade ovarian cancer, dementia, recurrent cystitis, breast cancer, fatigue, TIA, frequent falls who presents today to discuss pelvic pressure.  Symptoms began about 6 weeks ago with mid and lower pelvic pressure. Evaluated in our office on 12/20/2022, culture positive with E. coli without resistance.  Treated with Macrobid twice daily x 7 days. Her symptoms continued so she presented to urgent care the week of September 3rd. She was treated with another antibiotic (thinks it was cephalexin) twice daily for 7 days. Symptoms improved but she continues to notice pelvic pressure.   She denies hematuria, fevers, flank pain. She had a partial hysterectomy in her 30s and ovaries removed several years ago. She has an appointment scheduled with her oncologist for tomorrow. She does see GYN/ONC.   BP Readings from Last 3 Encounters:  01/19/23 116/80  01/18/23 (!) 162/88  12/20/22 134/88     Review of Systems  Genitourinary:  Negative for dysuria, flank pain, hematuria and vaginal discharge.       Pelvic pressure         Past Medical History:  Diagnosis Date   Acute bronchitis 05/13/2022   Acute metabolic encephalopathy 04/23/2020   Acute tubular injury of transplanted kidney (HCC) 06/14/2016   AKI (acute kidney injury) (HCC) 06/14/2016   Asthma    Breast cancer (HCC) 2015   left   Bronchitis    CAP (community acquired pneumonia) 04/29/2012   Chronic sinusitis    Closed fracture of medial malleolus 05/11/2018   Confusion 12/06/2016   COVID-19 virus infection 12/17/2020   Diabetes mellitus without complication (HCC)    Family history of breast cancer    GERD (gastroesophageal reflux disease)    History of ankle fracture 05/14/2018   Last Assessment  & Plan:  With a recent fall. Right medial malleous. Has ortho follow up soon, splinted now and tylenol helps her pain   History of recurrent UTIs    Hypertension    Hypokalemia 04/26/2012   Hyponatremia    Hypothyroidism    Imbalance 09/02/2019   Insomnia    Migraine    Neuropathic pain    Ovarian cancer (HCC) 2022   Ovarian mass, left 07/14/2020   Personal history of breast cancer    Personal history of chemotherapy current   bilateral ovarian ca   Pneumonia    Rheumatoid arthritis (HCC)    Sepsis (HCC) 04/26/2012   Sepsis secondary to UTI (HCC) 04/26/2012   Sleep apnea    Stroke [facility])    seen in CT scan   Thyroid disease    Transient hypotension 06/14/2016    Social History   Socioeconomic History   Marital status: Widowed    Spouse name: Not on file   Number of children: 2   Years of education: Not on file   Highest education level: Some college, no degree  Occupational History   Occupation: retired   Tobacco Use   Smoking status: Never   Smokeless tobacco: Never  Vaping Use   Vaping status: Never Used  Substance and Sexual Activity   Alcohol use: No   Drug use: No   Sexual activity: Not Currently  Other Topics Concern   Not on file  Social History Narrative   Pt lives in  1 story home with her daughter, [name] and [name]'s husband   Has 2 adult daughters   Highest level of education: some college   Worked mainly as Environmental health practitioner.   Social Determinants of Health   Financial Resource Strain: Low Risk  (08/24/2022)   Overall Financial Resource Strain (CARDIA)    Difficulty of Paying Living Expenses: Not hard at all  Food Insecurity: No Food Insecurity (08/24/2022)   Hunger Vital Sign    Worried About Running Out of Food in the Last Year: Never true    Ran Out of Food in the Last Year: Never true  Transportation Needs: No Transportation Needs (08/24/2022)   PRAPARE - Administrator, Civil Service (Medical): No    Lack of Transportation  (Non-Medical): No  Physical Activity: Inactive (08/24/2022)   Exercise Vital Sign    Days of Exercise per Week: 0 days    Minutes of Exercise per Session: 0 min  Stress: No Stress Concern Present (08/24/2022)   [employer] of Occupational Health - Occupational Stress Questionnaire    Feeling of Stress : Not at all  Social Connections: Socially Isolated (08/24/2022)   Social Connection and Isolation Panel [NHANES]    Frequency of Communication with Friends and Family: More than three times a week    Frequency of Social Gatherings with Friends and Family: More than three times a week    Attends Religious Services: Never    Database administrator or Organizations: No    Attends Banker Meetings: Never    Marital Status: Widowed  Intimate Partner Violence: Not At Risk (03/01/2022)   Humiliation, Afraid, Rape, and Kick questionnaire    Fear of Current or Ex-Partner: No    Emotionally Abused: No    Physically Abused: No    Sexually Abused: No    Past Surgical History:  Procedure Laterality Date   ABDOMINAL HYSTERECTOMY     BACK SURGERY     BREAST BIOPSY Right ?`   benign   BREAST LUMPECTOMY Left 2015   breast ca   CHOLECYSTECTOMY     JOINT REPLACEMENT     LAPAROTOMY N/A 07/14/2020   Procedure: LAPAROTOMY;  Surgeon: Nadara Mustard, MD;  Location: [facility];  Service: Gynecology;  Laterality: N/A;   OOPHORECTOMY     OVARIAN CYST REMOVAL     PORTA CATH INSERTION N/A 07/27/2020   Procedure: PORTA CATH INSERTION;  Surgeon: Annice Needy, MD;  Location: [facility];  Service: Cardiovascular;  Laterality: N/A;   PORTA CATH REMOVAL N/A 04/05/2021   Procedure: PORTA CATH REMOVAL;  Surgeon: Annice Needy, MD;  Location: [facility];  Service: Cardiovascular;  Laterality: N/A;   REPLACEMENT TOTAL KNEE Right    SALPINGOOPHORECTOMY Bilateral 07/14/2020   Procedure: OPEN SALPINGO OOPHORECTOMY;  Surgeon: Nadara Mustard, MD;  Location: [facility];  Service:  Gynecology;  Laterality: Bilateral;   TOTAL SHOULDER REPLACEMENT Left     Family History  Problem Relation Age of Onset   Diabetes Daughter    Hypertension Daughter    Fibromyalgia Daughter    GER disease Daughter    Fibromyalgia Daughter    Crohn's disease Daughter    Asthma Daughter    Hypertension Mother    Breast cancer Other    Breast cancer Niece    Uterine cancer Niece     No Known Allergies  Current Outpatient Medications on File Prior to Visit  Medication Sig Dispense Refill  Accu-Chek FastClix Lancets MISC USE AS INSTRUCTED TO TEST BLOOD SUGAR DAILY 102 each 1   ACCU-CHEK GUIDE test strip 1 EACH BY IN VITRO ROUTE IN THE MORNING, AT NOON, AND AT BEDTIME. MAY SUBSTITUTE TO ANY MANUFACTURER COVERED BY PATIENT'S INSURANCE. 300 strip 0   albuterol (VENTOLIN HFA) 108 (90 Base) MCG/ACT inhaler Inhale 1-2 puffs into the lungs every 6 (six) hours as needed. 18 g 0   aspirin EC 325 MG tablet Take 325 mg by mouth daily.     b complex vitamins capsule Take 1 capsule by mouth daily.     blood glucose meter kit and supplies Dispense based on patient and insurance preference. Use up to 2 times daily as directed. (FOR ICD-10 E10.9, E11.9). 1 each 0   Blood Glucose Monitoring Suppl DEVI 1 each by Does not apply route in the morning, at noon, and at bedtime. May substitute to any manufacturer covered by patient's insurance. 1 each 0   botulinum toxin Type A (BOTOX) 200 units injection Inject 155 units IM into multiple sites of face head and neck every 90 days 1 each 4   Cranberry 1000 MG CAPS Take 1 capsule by mouth 2 (two) times daily.     donepezil (ARICEPT) 10 MG tablet Take 1 tablet (10 mg total) by mouth at bedtime. 90 tablet 3   dorzolamide-timolol (COSOPT) 22.3-6.8 MG/ML ophthalmic solution Place 1 drop into both eyes 2 (two) times daily.     DULoxetine (CYMBALTA) 60 MG capsule TAKE 1 CAPSULE EVERY DAY FOR ANXIETY AND DEPRESSION. 90 capsule 2   fluticasone (FLONASE) 50 MCG/ACT  nasal spray USE 2 SPRAYS IN EACH NOSTRIL DAILY AS NEEDED FOR ALLERGIES OR RHINITIS 48 g 2   gabapentin (NEURONTIN) 600 MG tablet TAKE 1 TABLET THREE TIMES DAILY FOR NEUROPATHY 270 tablet 2   Lancets Misc. MISC 1 each by Does not apply route in the morning, at noon, and at bedtime. May substitute to any manufacturer covered by patient's insurance. 300 each 0   latanoprost (XALATAN) 0.005 % ophthalmic solution Place 1 drop into both eyes at bedtime.     levothyroxine (SYNTHROID) 112 MCG tablet TAKE 1 TABLET EVERY MORNING MONDAY THROUGH SATURDAY; AND 1/2 TABLET ON SUNDAY. TAKE ON AN EMPTY STOMACH WITH WATER ONLY 78 tablet 1   meclizine (ANTIVERT) 25 MG tablet TAKE 1 TABLET (25 MG TOTAL) BY MOUTH 2 (TWO) TIMES DAILY AS NEEDED FOR DIZZINESS. 180 tablet 0   memantine (NAMENDA) 5 MG tablet Take 1 tablet twice a day 180 tablet 3   metFORMIN (GLUCOPHAGE-XR) 500 MG 24 hr tablet Take 1 tablet (500 mg total) by mouth daily with breakfast. for diabetes. 90 tablet 1   mirtazapine (REMERON) 15 MG tablet Take 1 tablet (15 mg total) by mouth at bedtime. For sleep 90 tablet 0   Netarsudil Dimesylate (RHOPRESSA) 0.02 % SOLN Place 1 drop into both eyes daily at 6 (six) AM.     nystatin cream (MYCOSTATIN) Apply 1 Application topically 2 (two) times daily as needed. 30 g 2   omeprazole (PRILOSEC) 20 MG capsule TAKE 1 CAPSULE TWICE DAILY FOR HEARTBURN. 180 capsule 2   pilocarpine (PILOCAR) 2 % ophthalmic solution Place 1 drop into both eyes 4 (four) times daily.      propranolol (INDERAL) 80 MG tablet TAKE 1 TABLET EVERY DAY AS DIRECTED FOR HEADACHE PREVENTION. 90 tablet 2   rosuvastatin (CRESTOR) 10 MG tablet TAKE 1 TABLET EVERY DAY FOR CHOLESTEROL 90 tablet 2   SUMAtriptan (IMITREX)  25 MG tablet Take 1 tablet (25 mg total) by mouth every 2 (two) hours as needed for migraine. May repeat in 2 hours if headache persists or recurs. 10 tablet 2   VITAMIN D PO Take by mouth.     No current facility-administered medications  on file prior to visit.    BP 116/80   Pulse 63   Temp (!) 97.4 F (36.3 C) (Temporal)   Ht 5\' 4"  (1.626 m)   Wt 162 lb (73.5 kg)   SpO2 96%   BMI 27.81 kg/m  Objective:   Physical Exam Constitutional:      General: She is not in acute distress.    Appearance: She is not ill-appearing.  Cardiovascular:     Rate and Rhythm: Normal rate and regular rhythm.  Pulmonary:     Effort: Pulmonary effort is normal.     Breath sounds: Normal breath sounds.  Abdominal:     Tenderness: There is no right CVA tenderness or left CVA tenderness.  Skin:    General: Skin is warm and dry.           Assessment & Plan:  Pelvic pressure in female Assessment & Plan: Unclear etiology at this point.  Differentials include cystitis, cystocele, pelvic mass, rectocele.  She was unable to provide a urine specimen today, so she will return when able. She will see her oncologist tomorrow.  Consider transvaginal/pelvic ultrasound versus CT scan, especially given her history.  We also discussed gynecology evaluation for potential cystocele if needed.  Await results.  Orders: -     POCT Urinalysis Dipstick (Automated); Future -     Urine Culture; Future        Doreene Nest, NP

## 2023-01-20 ENCOUNTER — Encounter: Payer: Self-pay | Admitting: Oncology

## 2023-01-20 ENCOUNTER — Other Ambulatory Visit: Payer: Self-pay | Admitting: Radiology

## 2023-01-20 ENCOUNTER — Inpatient Hospital Stay (HOSPITAL_BASED_OUTPATIENT_CLINIC_OR_DEPARTMENT_OTHER): Payer: Medicare HMO | Admitting: Nurse Practitioner

## 2023-01-20 ENCOUNTER — Inpatient Hospital Stay: Payer: Medicare HMO | Admitting: Oncology

## 2023-01-20 ENCOUNTER — Inpatient Hospital Stay: Payer: Medicare HMO | Attending: Oncology

## 2023-01-20 VITALS — BP 156/75 | HR 59 | Temp 97.3°F | Resp 19 | Wt 163.0 lb

## 2023-01-20 DIAGNOSIS — Z8543 Personal history of malignant neoplasm of ovary: Secondary | ICD-10-CM

## 2023-01-20 DIAGNOSIS — N8110 Cystocele, unspecified: Secondary | ICD-10-CM | POA: Insufficient documentation

## 2023-01-20 DIAGNOSIS — Z8049 Family history of malignant neoplasm of other genital organs: Secondary | ICD-10-CM | POA: Diagnosis not present

## 2023-01-20 DIAGNOSIS — R102 Pelvic and perineal pain: Secondary | ICD-10-CM | POA: Insufficient documentation

## 2023-01-20 DIAGNOSIS — C569 Malignant neoplasm of unspecified ovary: Secondary | ICD-10-CM

## 2023-01-20 DIAGNOSIS — Z08 Encounter for follow-up examination after completed treatment for malignant neoplasm: Secondary | ICD-10-CM

## 2023-01-20 DIAGNOSIS — Z90722 Acquired absence of ovaries, bilateral: Secondary | ICD-10-CM | POA: Diagnosis not present

## 2023-01-20 DIAGNOSIS — N952 Postmenopausal atrophic vaginitis: Secondary | ICD-10-CM | POA: Insufficient documentation

## 2023-01-20 DIAGNOSIS — N390 Urinary tract infection, site not specified: Secondary | ICD-10-CM | POA: Diagnosis not present

## 2023-01-20 DIAGNOSIS — Z9079 Acquired absence of other genital organ(s): Secondary | ICD-10-CM | POA: Insufficient documentation

## 2023-01-20 DIAGNOSIS — Z803 Family history of malignant neoplasm of breast: Secondary | ICD-10-CM | POA: Insufficient documentation

## 2023-01-20 DIAGNOSIS — Z90710 Acquired absence of both cervix and uterus: Secondary | ICD-10-CM | POA: Insufficient documentation

## 2023-01-20 LAB — CBC WITH DIFFERENTIAL (CANCER CENTER ONLY)
Abs Immature Granulocytes: 0.03 10*3/uL (ref 0.00–0.07)
Basophils Absolute: 0 10*3/uL (ref 0.0–0.1)
Basophils Relative: 0 %
Eosinophils Absolute: 0.3 10*3/uL (ref 0.0–0.5)
Eosinophils Relative: 2 %
HCT: 39.1 % (ref 36.0–46.0)
Hemoglobin: 12.6 g/dL (ref 12.0–15.0)
Immature Granulocytes: 0 %
Lymphocytes Relative: 17 %
Lymphs Abs: 1.8 10*3/uL (ref 0.7–4.0)
MCH: 31.7 pg (ref 26.0–34.0)
MCHC: 32.2 g/dL (ref 30.0–36.0)
MCV: 98.5 fL (ref 80.0–100.0)
Monocytes Absolute: 0.8 10*3/uL (ref 0.1–1.0)
Monocytes Relative: 7 %
Neutro Abs: 8.1 10*3/uL — ABNORMAL HIGH (ref 1.7–7.7)
Neutrophils Relative %: 74 %
Platelet Count: 224 10*3/uL (ref 150–400)
RBC: 3.97 MIL/uL (ref 3.87–5.11)
RDW: 12.9 % (ref 11.5–15.5)
WBC Count: 11.1 10*3/uL — ABNORMAL HIGH (ref 4.0–10.5)
nRBC: 0 % (ref 0.0–0.2)

## 2023-01-20 LAB — POC URINALSYSI DIPSTICK (AUTOMATED)
Bilirubin, UA: 4 — AB
Blood, UA: 10 — AB
Glucose, UA: NEGATIVE
Ketones, UA: 15 — AB
Nitrite, UA: NEGATIVE
Protein, UA: POSITIVE — AB
Spec Grav, UA: >=1.03 — AB (ref 1.010–1.025)
Urobilinogen, UA: 0.2 E.U./dL
pH, UA: 6 (ref 5.0–8.0)

## 2023-01-20 LAB — CMP (CANCER CENTER ONLY)
ALT: 20 U/L (ref 0–44)
AST: 31 U/L (ref 15–41)
Albumin: 4 g/dL (ref 3.5–5.0)
Alkaline Phosphatase: 64 U/L (ref 38–126)
Anion gap: 11 (ref 5–15)
BUN: 15 mg/dL (ref 8–23)
CO2: 25 mmol/L (ref 22–32)
Calcium: 8.9 mg/dL (ref 8.9–10.3)
Chloride: 103 mmol/L (ref 98–111)
Creatinine: 0.85 mg/dL (ref 0.44–1.00)
GFR, Estimated: >60 mL/min
Glucose, Bld: 265 mg/dL — ABNORMAL HIGH (ref 70–99)
Potassium: 3.6 mmol/L (ref 3.5–5.1)
Sodium: 139 mmol/L (ref 135–145)
Total Bilirubin: 0.9 mg/dL (ref 0.3–1.2)
Total Protein: 7.2 g/dL (ref 6.5–8.1)

## 2023-01-20 MED ORDER — ESTRADIOL 0.1 MG/GM VA CREA
TOPICAL_CREAM | VAGINAL | 0 refills | Status: DC
Start: 2023-01-20 — End: 2023-04-04

## 2023-01-20 NOTE — Progress Notes (Signed)
Symptom Management Clinic  [facility] A Department of the [place]. [facility] [address] [place], [place] [address] [phone] (phone) [phone] (fax)  Patient Care Team: Doreene Nest, NP as PCP - General (Internal Medicine) Van Clines, MD as Consulting Physician (Neurology) Benita Gutter, RN as Oncology Nurse Navigator [place], Garry Heater, [facility] (Inactive) as Pharmacist (Pharmacist) Van Clines, MD as Consulting Physician (Neurology)   Name of the patient: Shannon Higgins  [id number]  [birth year]   Date of visit: 01/20/23  Diagnosis- Ovarian Cancer  Chief complaint/ Reason for visit- Vaginal pain  Heme/Onc history:  Oncology History  High grade ovarian cancer (HCC)  07/21/2020 Initial Diagnosis   Ovarian cancer, bilateral   07/21/2020 Cancer Staging   Staging form: Ovary, Fallopian Tube, and Primary Peritoneal Carcinoma, AJCC 8th Edition - Pathologic stage from 07/21/2020: FIGO Stage IC, calculated as Stage Unknown (pT1c, pNX, cM0) - Signed by Creig Hines, MD on 07/24/2020 Gross residual tumor after primary cyto-reductive surgery: Absent   08/04/2020 - 10/27/2020 Chemotherapy   Patient is on Treatment Plan : BREAST Carboplatin (AUC 5) 21d      Genetic Testing   Negative genetic testing. No pathogenic variants identified on the [facility] panel. HRD has not been performed but can be if needed. VUS in HOXB13 called [id number] identified. The report date is 09/21/2020.  The [facility] gene panel offered by [employer] includes sequencing and deletion/duplication testing of the following 45 genes: APC, ATM, AXIN2, BAP1, BARD1, BMPR1A, BRCA1, BRCA2, BRIP1, CDH1, CDK4, CDKN2A, CHEK2, CTNNA1, FH, FLCN, HOXB13 (seq only), MEN1, MET, MLH1, MSH2, MSH3 (excluding repetitive portions of exon 1), MSH6, MUTYH, NTHL1, PALB2, PMS2, PTEN, RAD51C, RAD51D, SDHA, SDHB, SDHC, SDHD, SMAD4, STK11,  TP53, TSC1, TSC2, VHL.     Interval history- Patient is 88 year old female with history of ovarian cancer who presents to symptom management clinic at request of Dr Smith Robert for vaginal pain. Pain started approximately 3 weeks ago and has worsened. She says it is ongoing and feels like pressure in her vagina. Nothing makes it better or worse. She has had issues with UTI recently and dropped off urine sample to pcp and is waiting for results. No fevers or chills. No abdominal pain. Denies bleeding or discharge.   Review of systems- Review of Systems  Constitutional:  Positive for malaise/fatigue. Negative for chills, fever and weight loss.  HENT:  Negative for hearing loss, nosebleeds, sore throat and tinnitus.   Eyes:  Negative for blurred vision and double vision.  Respiratory:  Negative for cough, hemoptysis, shortness of breath and wheezing.   Cardiovascular:  Negative for chest pain, palpitations and leg swelling.  Gastrointestinal:  Negative for abdominal pain, blood in stool, constipation, diarrhea, melena, nausea and vomiting.  Genitourinary:  Negative for dysuria, flank pain, frequency, hematuria and urgency.       Per hpi  Musculoskeletal:  Negative for back pain, falls, joint pain and myalgias.  Skin:  Negative for itching and rash.  Neurological:  Negative for dizziness, tingling, sensory change, loss of consciousness, weakness and headaches.  Endo/Heme/Allergies:  Negative for environmental allergies. Does not bruise/bleed easily.  Psychiatric/Behavioral:  Negative for depression. The patient is not nervous/anxious and does not have insomnia.      Current treatment- surveillance  No Known Allergies  Past Medical History:  Diagnosis Date   Acute bronchitis 05/13/2022   Acute metabolic encephalopathy 04/23/2020   Acute tubular  injury of transplanted kidney (HCC) 06/14/2016   AKI (acute kidney injury) (HCC) 06/14/2016   Asthma    Breast cancer (HCC) 2015   left   Bronchitis     CAP (community acquired pneumonia) 04/29/2012   Chronic sinusitis    Closed fracture of medial malleolus 05/11/2018   Confusion 12/06/2016   COVID-19 virus infection 12/17/2020   Diabetes mellitus without complication [facility])    Family history of breast cancer    GERD (gastroesophageal reflux disease)    History of ankle fracture 05/14/2018   Last Assessment & Plan:  With a recent fall. Right medial malleous. Has ortho follow up soon, splinted now and tylenol helps her pain   History of recurrent UTIs    Hypertension    Hypokalemia 04/26/2012   Hyponatremia    Hypothyroidism    Imbalance 09/02/2019   Insomnia    Migraine    Neuropathic pain    Ovarian cancer (HCC) 2022   Ovarian mass, left 07/14/2020   Personal history of breast cancer    Personal history of chemotherapy current   bilateral ovarian ca   Pneumonia    Rheumatoid arthritis (HCC)    Sepsis (HCC) 04/26/2012   Sepsis secondary to UTI (HCC) 04/26/2012   Sleep apnea    Stroke [facility])    seen in CT scan   Thyroid disease    Transient hypotension 06/14/2016    Past Surgical History:  Procedure Laterality Date   ABDOMINAL HYSTERECTOMY     BACK SURGERY     BREAST BIOPSY Right ?`   benign   BREAST LUMPECTOMY Left 2015   breast ca   CHOLECYSTECTOMY     JOINT REPLACEMENT     LAPAROTOMY N/A 07/14/2020   Procedure: LAPAROTOMY;  Surgeon: Nadara Mustard, MD;  Location: [facility] ORS;  Service: Gynecology;  Laterality: N/A;   OOPHORECTOMY     OVARIAN CYST REMOVAL     PORTA CATH INSERTION N/A 07/27/2020   Procedure: PORTA CATH INSERTION;  Surgeon: Annice Needy, MD;  Location: [facility] INVASIVE CV LAB;  Service: Cardiovascular;  Laterality: N/A;   PORTA CATH REMOVAL N/A 04/05/2021   Procedure: PORTA CATH REMOVAL;  Surgeon: Annice Needy, MD;  Location: [facility] INVASIVE CV LAB;  Service: Cardiovascular;  Laterality: N/A;   REPLACEMENT TOTAL KNEE Right    SALPINGOOPHORECTOMY Bilateral 07/14/2020   Procedure: OPEN SALPINGO OOPHORECTOMY;   Surgeon: Nadara Mustard, MD;  Location: [facility] ORS;  Service: Gynecology;  Laterality: Bilateral;   TOTAL SHOULDER REPLACEMENT Left     Social History   Socioeconomic History   Marital status: Widowed    Spouse name: Not on file   Number of children: 2   Years of education: Not on file   Highest education level: Some college, no degree  Occupational History   Occupation: retired   Tobacco Use   Smoking status: Never   Smokeless tobacco: Never  Vaping Use   Vaping status: Never Used  Substance and Sexual Activity   Alcohol use: No   Drug use: No   Sexual activity: Not Currently  Other Topics Concern   Not on file  Social History Narrative   Pt lives in 1 story home with her daughter, [name] and [name]'s husband   Has 2 adult daughters   Highest level of education: some college   Worked mainly as Environmental health practitioner.   Social Determinants of Health   Financial Resource Strain: Low Risk  (08/24/2022)   Overall Financial Resource Strain (CARDIA)  Difficulty of Paying Living Expenses: Not hard at all  Food Insecurity: No Food Insecurity (08/24/2022)   Hunger Vital Sign    Worried About Running Out of Food in the Last Year: Never true    Ran Out of Food in the Last Year: Never true  Transportation Needs: No Transportation Needs (08/24/2022)   PRAPARE - Administrator, Civil Service (Medical): No    Lack of Transportation (Non-Medical): No  Physical Activity: Inactive (08/24/2022)   Exercise Vital Sign    Days of Exercise per Week: 0 days    Minutes of Exercise per Session: 0 min  Stress: No Stress Concern Present (08/24/2022)   [employer] of Occupational Health - Occupational Stress Questionnaire    Feeling of Stress : Not at all  Social Connections: Socially Isolated (08/24/2022)   Social Connection and Isolation Panel [NHANES]    Frequency of Communication with Friends and Family: More than three times a week    Frequency of Social Gatherings with  Friends and Family: More than three times a week    Attends Religious Services: Never    Database administrator or Organizations: No    Attends Banker Meetings: Never    Marital Status: Widowed  Intimate Partner Violence: Not At Risk (03/01/2022)   Humiliation, Afraid, Rape, and Kick questionnaire    Fear of Current or Ex-Partner: No    Emotionally Abused: No    Physically Abused: No    Sexually Abused: No    Family History  Problem Relation Age of Onset   Diabetes Daughter    Hypertension Daughter    Fibromyalgia Daughter    GER disease Daughter    Fibromyalgia Daughter    Crohn's disease Daughter    Asthma Daughter    Hypertension Mother    Breast cancer Other    Breast cancer Niece    Uterine cancer Niece      Current Outpatient Medications:    Accu-Chek FastClix Lancets MISC, USE AS INSTRUCTED TO TEST BLOOD SUGAR DAILY, Disp: 102 each, Rfl: 1   ACCU-CHEK GUIDE test strip, 1 EACH BY IN VITRO ROUTE IN THE MORNING, AT NOON, AND AT BEDTIME. MAY SUBSTITUTE TO ANY MANUFACTURER COVERED BY PATIENT'S INSURANCE., Disp: 300 strip, Rfl: 0   albuterol (VENTOLIN HFA) 108 (90 Base) MCG/ACT inhaler, Inhale 1-2 puffs into the lungs every 6 (six) hours as needed., Disp: 18 g, Rfl: 0   aspirin EC 325 MG tablet, Take 325 mg by mouth daily., Disp: , Rfl:    b complex vitamins capsule, Take 1 capsule by mouth daily., Disp: , Rfl:    blood glucose meter kit and supplies, Dispense based on patient and insurance preference. Use up to 2 times daily as directed. (FOR ICD-10 E10.9, E11.9)., Disp: 1 each, Rfl: 0   Blood Glucose Monitoring Suppl DEVI, 1 each by Does not apply route in the morning, at noon, and at bedtime. May substitute to any manufacturer covered by patient's insurance., Disp: 1 each, Rfl: 0   botulinum toxin Type A (BOTOX) 200 units injection, Inject 155 units IM into multiple sites of face head and neck every 90 days, Disp: 1 each, Rfl: 4   Cranberry 1000 MG CAPS, Take 1  capsule by mouth 2 (two) times daily., Disp: , Rfl:    donepezil (ARICEPT) 10 MG tablet, Take 1 tablet (10 mg total) by mouth at bedtime., Disp: 90 tablet, Rfl: 3   dorzolamide-timolol (COSOPT) 22.3-6.8 MG/ML ophthalmic solution, Place 1  drop into both eyes 2 (two) times daily., Disp: , Rfl:    DULoxetine (CYMBALTA) 60 MG capsule, TAKE 1 CAPSULE EVERY DAY FOR ANXIETY AND DEPRESSION., Disp: 90 capsule, Rfl: 2   fluticasone (FLONASE) 50 MCG/ACT nasal spray, USE 2 SPRAYS IN EACH NOSTRIL DAILY AS NEEDED FOR ALLERGIES OR RHINITIS, Disp: 48 g, Rfl: 2   gabapentin (NEURONTIN) 600 MG tablet, TAKE 1 TABLET THREE TIMES DAILY FOR NEUROPATHY, Disp: 270 tablet, Rfl: 2   Lancets Misc. MISC, 1 each by Does not apply route in the morning, at noon, and at bedtime. May substitute to any manufacturer covered by patient's insurance., Disp: 300 each, Rfl: 0   latanoprost (XALATAN) 0.005 % ophthalmic solution, Place 1 drop into both eyes at bedtime., Disp: , Rfl:    levothyroxine (SYNTHROID) 112 MCG tablet, TAKE 1 TABLET EVERY MORNING MONDAY THROUGH SATURDAY; AND 1/2 TABLET ON SUNDAY. TAKE ON AN EMPTY STOMACH WITH WATER ONLY, Disp: 78 tablet, Rfl: 1   meclizine (ANTIVERT) 25 MG tablet, TAKE 1 TABLET (25 MG TOTAL) BY MOUTH 2 (TWO) TIMES DAILY AS NEEDED FOR DIZZINESS., Disp: 180 tablet, Rfl: 0   meloxicam (MOBIC) 15 MG tablet, TAKE 1 TABLET EVERY DAY AS NEEDED FOR PAIN, Disp: 90 tablet, Rfl: 0   memantine (NAMENDA) 5 MG tablet, Take 1 tablet twice a day, Disp: 180 tablet, Rfl: 3   metFORMIN (GLUCOPHAGE-XR) 500 MG 24 hr tablet, Take 1 tablet (500 mg total) by mouth daily with breakfast. for diabetes., Disp: 90 tablet, Rfl: 1   mirtazapine (REMERON) 15 MG tablet, Take 1 tablet (15 mg total) by mouth at bedtime. For sleep, Disp: 90 tablet, Rfl: 0   Netarsudil Dimesylate (RHOPRESSA) 0.02 % SOLN, Place 1 drop into both eyes daily at 6 (six) AM., Disp: , Rfl:    nystatin cream (MYCOSTATIN), Apply 1 Application topically 2 (two)  times daily as needed., Disp: 30 g, Rfl: 2   omeprazole (PRILOSEC) 20 MG capsule, TAKE 1 CAPSULE TWICE DAILY FOR HEARTBURN., Disp: 180 capsule, Rfl: 2   pilocarpine (PILOCAR) 2 % ophthalmic solution, Place 1 drop into both eyes 4 (four) times daily. , Disp: , Rfl:    propranolol (INDERAL) 80 MG tablet, TAKE 1 TABLET EVERY DAY AS DIRECTED FOR HEADACHE PREVENTION., Disp: 90 tablet, Rfl: 2   rosuvastatin (CRESTOR) 10 MG tablet, TAKE 1 TABLET EVERY DAY FOR CHOLESTEROL, Disp: 90 tablet, Rfl: 2   SUMAtriptan (IMITREX) 25 MG tablet, Take 1 tablet (25 mg total) by mouth every 2 (two) hours as needed for migraine. May repeat in 2 hours if headache persists or recurs., Disp: 10 tablet, Rfl: 2   VITAMIN D PO, Take by mouth., Disp: , Rfl:   Physical exam: There were no vitals filed for this visit. Physical Exam Constitutional:      Appearance: She is not ill-appearing.  Pulmonary:     Effort: No respiratory distress.  Abdominal:     General: There is no distension.     Palpations: There is no mass.     Tenderness: There is no abdominal tenderness. There is no right CVA tenderness, left CVA tenderness, guarding or rebound.  Musculoskeletal:        General: No deformity.  Skin:    Findings: No rash.  Neurological:     Mental Status: She is alert and oriented to person, place, and time.  Psychiatric:        Mood and Affect: Mood normal.        Behavior: Behavior normal.  Pelvic:  EGBUS: no lesions. Patient localizes her pain to vaginal introitus Vagina: no lesions, no discharge or bleeding. Atrophic. Mild Cystocele.  VME: no palpable masses. Nontender. Smooth sidewalls.  RV: Deferred  No results found.  Assessment and plan- Patient is a 88 y.o. female   Recurrent UTI- Recommend management by PCP and consider prophylactic dosing after treatment dosing for 3 months then can consider discontinuation if she has good response to topical estrogen.  Vaginal atrophy & mild cystocele- likely  contributing etiology to her recurrent uti. We reviewed the clinical manifestations of vaginal dryness including pain, pulling, burning, tenderness. I explained to the patient that after women go through menopause and their estrogen levels are diminished, normal vaginal flora change and pH in vaginal canal also changes. Because of this, the vaginal canal may be colonized by bacteria from the rectum instead of the lactobacillus, which has a protective quality. This, accompanied by the loss of the mucus barrier d/t vaginal atrophy can cause recurrent UTI and/or BV. In some studies, the use of vaginal estrogen cream has been demonstrated to reduce rate of recurrent UTIs to one a year. I recommend topical application of estrace cream to vaginal introitus and lower vagina then spread towards urethra. This should have plumping effect and help minimize recurrent uti. It may take 6-12 weeks to notice effect which is why I'd encourage low dose antibiotics in interim.  Vaginal discomfort- suspect secondary to UTI and atrophy. If ongoing pain or worsening, consider imaging. Discussed findings with Dr Smith Robert  Disposition:  See me in 8 weeks for f/u of vaginal pain- la   Visit Diagnosis 1. Vaginal pain   2. Vaginal atrophy   3. Recurrent UTI     Patient expressed understanding and was in agreement with this plan. She also understands that She can call clinic at any time with any questions, concerns, or complaints.   Thank you for allowing me to participate in the care of this very pleasant patient.   Consuello Masse, DNP, AGNP-C, AOCNP [facility] [phone]

## 2023-01-21 LAB — CA 125: Cancer Antigen (CA) 125: 15.7 U/mL (ref 0.0–38.1)

## 2023-01-21 NOTE — Progress Notes (Signed)
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  She will is diagnosed with UTI and as a part of her Work-up had CT abdomen and pelvis with contrast.  That showed a 13.5 11.8 x 15.2 cm simple cystic appearing area within the pelvis.  Ultrasound of the pelvis showed 17 x 10.7 x 11.9 cm complex cystic midline pelvic mass.  Findings indeterminate and could reflect a cystic ovarian neoplasm.  She did have CA-125 checked which was normal at 22.9 but HD4 was elevated at 99.  Patient underwent bilateral salpingo-oophorectomy.  She has had prior hysterectomy.  Pathology showed high-grade serous carcinoma involving both ovaries but no involvement of the fallopian tube.  Omentum was negative for malignancy.  Lymph nodes not sampled.  Tumor size 4 cm high-grade.  FIGO stage IC peritoneal/ascitic fluid involvement was not identified.  Patient has baseline neuropathy. Therefore carbo/doxil chosen for adjuvant chemotherapy upto 6 cycles if tolerated.    Patient completed 4 cycles of Carbo doxil chemotherapy in July 2022 but could not tolerate further doses and was therefore stopped. She has been in remission  since then.  Interval history-patient reports vulvar pain. She has been having 2 episodes of UTI in the last   ECOG PS- 2 Pain scale- 0   Review of systems- Review of Systems  Constitutional:  Negative for chills, fever, malaise/fatigue and weight loss.  HENT:  Negative for congestion, ear discharge and nosebleeds.   Eyes:  Negative for blurred vision.  Respiratory:  Negative for cough, hemoptysis, sputum production, shortness of breath and wheezing.   Cardiovascular:  Negative for chest pain, palpitations, orthopnea and claudication.  Gastrointestinal:  Negative for abdominal pain, blood in stool, constipation, diarrhea, heartburn, melena, nausea and vomiting.  Genitourinary:  Negative for dysuria, flank pain, frequency, hematuria and urgency.       Vulvar pain  Musculoskeletal:  Negative for back pain, joint pain and myalgias.  Skin:  Negative for rash.  Neurological:  Negative for dizziness, tingling, focal weakness, seizures, weakness and headaches.  Endo/Heme/Allergies:  Does not bruise/bleed easily.  Psychiatric/Behavioral:  Negative for depression and suicidal ideas. The patient does not have insomnia.       No Known Allergies   Past Medical History:  Diagnosis Date   Acute bronchitis 05/13/2022   Acute metabolic encephalopathy 04/23/2020   Acute tubular injury of transplanted kidney (HCC) 06/14/2016   AKI (acute kidney injury) (HCC) 06/14/2016   Asthma    Breast cancer (HCC) 2015   left   Bronchitis    CAP (community acquired pneumonia) 04/29/2012   Chronic sinusitis    Closed fracture of medial malleolus 05/11/2018   Confusion 12/06/2016   COVID-19 virus infection 12/17/2020   Diabetes mellitus without complication (HCC)    Family history of  breast cancer    GERD (gastroesophageal reflux disease)    History of ankle fracture 05/14/2018   Last Assessment & Plan:  With a recent fall. Right medial malleous. Has ortho follow up soon, splinted now and tylenol helps  her pain   History of recurrent UTIs    Hypertension    Hypokalemia 04/26/2012   Hyponatremia    Hypothyroidism    Imbalance 09/02/2019   Insomnia    Migraine    Neuropathic pain    Ovarian cancer (HCC) 2022   Ovarian mass, left 07/14/2020   Personal history of breast cancer    Personal history of chemotherapy current   bilateral ovarian ca   Pneumonia    Rheumatoid arthritis (HCC)    Sepsis (HCC) 04/26/2012   Sepsis secondary to UTI (HCC) 04/26/2012   Sleep apnea    Stroke [facility])    seen in CT scan   Thyroid disease    Transient hypotension 06/14/2016     Past Surgical History:  Procedure Laterality Date   ABDOMINAL HYSTERECTOMY     BACK SURGERY     BREAST BIOPSY Right ?`   benign   BREAST LUMPECTOMY Left 2015   breast ca   CHOLECYSTECTOMY     JOINT REPLACEMENT     LAPAROTOMY N/A 07/14/2020   Procedure: LAPAROTOMY;  Surgeon: Nadara Mustard, MD;  Location: [facility] ORS;  Service: Gynecology;  Laterality: N/A;   OOPHORECTOMY     OVARIAN CYST REMOVAL     PORTA CATH INSERTION N/A 07/27/2020   Procedure: PORTA CATH INSERTION;  Surgeon: Annice Needy, MD;  Location: [facility] INVASIVE CV LAB;  Service: Cardiovascular;  Laterality: N/A;   PORTA CATH REMOVAL N/A 04/05/2021   Procedure: PORTA CATH REMOVAL;  Surgeon: Annice Needy, MD;  Location: [facility] INVASIVE CV LAB;  Service: Cardiovascular;  Laterality: N/A;   REPLACEMENT TOTAL KNEE Right    SALPINGOOPHORECTOMY Bilateral 07/14/2020   Procedure: OPEN SALPINGO OOPHORECTOMY;  Surgeon: Nadara Mustard, MD;  Location: [facility] ORS;  Service: Gynecology;  Laterality: Bilateral;   TOTAL SHOULDER REPLACEMENT Left     Social History   Socioeconomic History   Marital status: Widowed    Spouse name: Not on file   Number of children: 2   Years of education: Not on file   Highest education level: Some college, no degree  Occupational History   Occupation: retired   Tobacco Use   Smoking status: Never   Smokeless tobacco: Never  Vaping Use    Vaping status: Never Used  Substance and Sexual Activity   Alcohol use: No   Drug use: No   Sexual activity: Not Currently  Other Topics Concern   Not on file  Social History Narrative   Pt lives in 1 story home with her daughter, [name] and [name]'s husband   Has 2 adult daughters   Highest level of education: some college   Worked mainly as Environmental health practitioner.   Social Determinants of Health   Financial Resource Strain: Low Risk  (08/24/2022)   Overall Financial Resource Strain (CARDIA)    Difficulty of Paying Living Expenses: Not hard at all  Food Insecurity: No Food Insecurity (08/24/2022)   Hunger Vital Sign    Worried About Running Out of Food in the Last Year: Never true    Ran Out of Food in the Last Year: Never true  Transportation Needs: No Transportation Needs (08/24/2022)   PRAPARE - Administrator, Civil Service (Medical): No  Lack of Transportation (Non-Medical): No  Physical Activity: Inactive (08/24/2022)   Exercise Vital Sign    Days of Exercise per Week: 0 days    Minutes of Exercise per Session: 0 min  Stress: No Stress Concern Present (08/24/2022)   [employer] of Occupational Health - Occupational Stress Questionnaire    Feeling of Stress : Not at all  Social Connections: Socially Isolated (08/24/2022)   Social Connection and Isolation Panel [NHANES]    Frequency of Communication with Friends and Family: More than three times a week    Frequency of Social Gatherings with Friends and Family: More than three times a week    Attends Religious Services: Never    Database administrator or Organizations: No    Attends Banker Meetings: Never    Marital Status: Widowed  Intimate Partner Violence: Not At Risk (03/01/2022)   Humiliation, Afraid, Rape, and Kick questionnaire    Fear of Current or Ex-Partner: No    Emotionally Abused: No    Physically Abused: No    Sexually Abused: No    Family History  Problem Relation Age of  Onset   Diabetes Daughter    Hypertension Daughter    Fibromyalgia Daughter    GER disease Daughter    Fibromyalgia Daughter    Crohn's disease Daughter    Asthma Daughter    Hypertension Mother    Breast cancer Other    Breast cancer Niece    Uterine cancer Niece      Current Outpatient Medications:    Accu-Chek FastClix Lancets MISC, USE AS INSTRUCTED TO TEST BLOOD SUGAR DAILY, Disp: 102 each, Rfl: 1   ACCU-CHEK GUIDE test strip, 1 EACH BY IN VITRO ROUTE IN THE MORNING, AT NOON, AND AT BEDTIME. MAY SUBSTITUTE TO ANY MANUFACTURER COVERED BY PATIENT'S INSURANCE., Disp: 300 strip, Rfl: 0   albuterol (VENTOLIN HFA) 108 (90 Base) MCG/ACT inhaler, Inhale 1-2 puffs into the lungs every 6 (six) hours as needed., Disp: 18 g, Rfl: 0   aspirin EC 325 MG tablet, Take 325 mg by mouth daily., Disp: , Rfl:    b complex vitamins capsule, Take 1 capsule by mouth daily., Disp: , Rfl:    blood glucose meter kit and supplies, Dispense based on patient and insurance preference. Use up to 2 times daily as directed. (FOR ICD-10 E10.9, E11.9)., Disp: 1 each, Rfl: 0   Blood Glucose Monitoring Suppl DEVI, 1 each by Does not apply route in the morning, at noon, and at bedtime. May substitute to any manufacturer covered by patient's insurance., Disp: 1 each, Rfl: 0   botulinum toxin Type A (BOTOX) 200 units injection, Inject 155 units IM into multiple sites of face head and neck every 90 days, Disp: 1 each, Rfl: 4   Cranberry 1000 MG CAPS, Take 1 capsule by mouth 2 (two) times daily., Disp: , Rfl:    donepezil (ARICEPT) 10 MG tablet, Take 1 tablet (10 mg total) by mouth at bedtime., Disp: 90 tablet, Rfl: 3   dorzolamide-timolol (COSOPT) 22.3-6.8 MG/ML ophthalmic solution, Place 1 drop into both eyes 2 (two) times daily., Disp: , Rfl:    DULoxetine (CYMBALTA) 60 MG capsule, TAKE 1 CAPSULE EVERY DAY FOR ANXIETY AND DEPRESSION., Disp: 90 capsule, Rfl: 2   fluticasone (FLONASE) 50 MCG/ACT nasal spray, USE 2 SPRAYS IN  EACH NOSTRIL DAILY AS NEEDED FOR ALLERGIES OR RHINITIS, Disp: 48 g, Rfl: 2   gabapentin (NEURONTIN) 600 MG tablet, TAKE 1 TABLET THREE TIMES DAILY  FOR NEUROPATHY, Disp: 270 tablet, Rfl: 2   Lancets Misc. MISC, 1 each by Does not apply route in the morning, at noon, and at bedtime. May substitute to any manufacturer covered by patient's insurance., Disp: 300 each, Rfl: 0   latanoprost (XALATAN) 0.005 % ophthalmic solution, Place 1 drop into both eyes at bedtime., Disp: , Rfl:    levothyroxine (SYNTHROID) 112 MCG tablet, TAKE 1 TABLET EVERY MORNING MONDAY THROUGH SATURDAY; AND 1/2 TABLET ON SUNDAY. TAKE ON AN EMPTY STOMACH WITH WATER ONLY, Disp: 78 tablet, Rfl: 1   meclizine (ANTIVERT) 25 MG tablet, TAKE 1 TABLET (25 MG TOTAL) BY MOUTH 2 (TWO) TIMES DAILY AS NEEDED FOR DIZZINESS., Disp: 180 tablet, Rfl: 0   meloxicam (MOBIC) 15 MG tablet, TAKE 1 TABLET EVERY DAY AS NEEDED FOR PAIN, Disp: 90 tablet, Rfl: 0   memantine (NAMENDA) 5 MG tablet, Take 1 tablet twice a day, Disp: 180 tablet, Rfl: 3   metFORMIN (GLUCOPHAGE-XR) 500 MG 24 hr tablet, Take 1 tablet (500 mg total) by mouth daily with breakfast. for diabetes., Disp: 90 tablet, Rfl: 1   mirtazapine (REMERON) 15 MG tablet, Take 1 tablet (15 mg total) by mouth at bedtime. For sleep, Disp: 90 tablet, Rfl: 0   Netarsudil Dimesylate (RHOPRESSA) 0.02 % SOLN, Place 1 drop into both eyes daily at 6 (six) AM., Disp: , Rfl:    nystatin cream (MYCOSTATIN), Apply 1 Application topically 2 (two) times daily as needed., Disp: 30 g, Rfl: 2   omeprazole (PRILOSEC) 20 MG capsule, TAKE 1 CAPSULE TWICE DAILY FOR HEARTBURN., Disp: 180 capsule, Rfl: 2   pilocarpine (PILOCAR) 2 % ophthalmic solution, Place 1 drop into both eyes 4 (four) times daily. , Disp: , Rfl:    propranolol (INDERAL) 80 MG tablet, TAKE 1 TABLET EVERY DAY AS DIRECTED FOR HEADACHE PREVENTION., Disp: 90 tablet, Rfl: 2   rosuvastatin (CRESTOR) 10 MG tablet, TAKE 1 TABLET EVERY DAY FOR CHOLESTEROL, Disp: 90  tablet, Rfl: 2   SUMAtriptan (IMITREX) 25 MG tablet, Take 1 tablet (25 mg total) by mouth every 2 (two) hours as needed for migraine. May repeat in 2 hours if headache persists or recurs., Disp: 10 tablet, Rfl: 2   VITAMIN D PO, Take by mouth., Disp: , Rfl:    estradiol (ESTRACE) 0.1 MG/GM vaginal cream, Insert 1/4 finger full of cream into vagina and spread towards urethra at bedtime 3 nights a week, Disp: 42.5 g, Rfl: 0  Physical exam:  Vitals:   01/20/23 1428  BP: (!) 156/75  Pulse: (!) 59  Resp: 19  Temp: (!) 97.3 F (36.3 C)  SpO2: 97%  Weight: 163 lb (73.9 kg)   Physical Exam Cardiovascular:     Rate and Rhythm: Normal rate and regular rhythm.     Heart sounds: Normal heart sounds.  Pulmonary:     Effort: Pulmonary effort is normal.     Breath sounds: Normal breath sounds.  Abdominal:     General: Bowel sounds are normal. There is no distension.     Palpations: Abdomen is soft.     Tenderness: There is no abdominal tenderness.  Skin:    General: Skin is warm and dry.  Neurological:     Mental Status: She is alert and oriented to person, place, and time.         Latest Ref Rng & Units 01/20/2023    1:52 PM  CMP  Glucose 70 - 99 mg/dL 161   BUN 8 - 23 mg/dL 15  Creatinine 0.44 - 1.00 mg/dL 0.10   Sodium 272 - 536 mmol/L 139   Potassium 3.5 - 5.1 mmol/L 3.6   Chloride 98 - 111 mmol/L 103   CO2 22 - 32 mmol/L 25   Calcium 8.9 - 10.3 mg/dL 8.9   Total Protein 6.5 - 8.1 g/dL 7.2   Total Bilirubin 0.3 - 1.2 mg/dL 0.9   Alkaline Phos 38 - 126 U/L 64   AST 15 - 41 U/L 31   ALT 0 - 44 U/L 20       Latest Ref Rng & Units 01/20/2023    1:52 PM  CBC  WBC 4.0 - 10.5 K/uL 11.1   Hemoglobin 12.0 - 15.0 g/dL 64.4   Hematocrit 03.4 - 46.0 % 39.1   Platelets 150 - 400 K/uL 224     No images are attached to the encounter.  No results found.   Assessment and plan- Patient is a 88 y.o. female  with high-grade serous carcinoma of the ovary FIGO stage I cpT1 cpNX cMX  s/p bilateral salpingo-oophorectomy.   She is s/p 4 cycles of adjuvant carbo Doxil chemotherapy. She is in remission and this is a routine f/u visit  Is complaining of vulvar pain in the setting of recurrent UTI she may be having atrophic vaginitis.  She will be seen for pelvic exam today.  Abdominal exam is benign and otherwise her symptoms are not concerning for disease recurrence.  I am holding off on getting any CT scans at this time.  If pain worsens in the future she will let us know.  She will be seen by GYN oncology and January 2024 I will plan to see her sometime in April 2024 with CBC with differential CMP and CA125   Visit Diagnosis 1. Encounter for follow-up surveillance of ovarian cancer      Dr. Owens Shark, MD, MPH [facility] at [facility] [phone] 01/21/2023 6:28 PM

## 2023-01-22 ENCOUNTER — Other Ambulatory Visit: Payer: Self-pay | Admitting: Primary Care

## 2023-01-22 DIAGNOSIS — N3001 Acute cystitis with hematuria: Secondary | ICD-10-CM

## 2023-01-22 LAB — URINE CULTURE
MICRO NUMBER:: 15495446
SPECIMEN QUALITY:: ADEQUATE

## 2023-01-22 MED ORDER — SULFAMETHOXAZOLE-TRIMETHOPRIM 800-160 MG PO TABS
1.0000 | ORAL_TABLET | Freq: Two times a day (BID) | ORAL | 0 refills | Status: DC
Start: 2023-01-22 — End: 2023-04-02

## 2023-01-27 NOTE — Telephone Encounter (Signed)
FYI: This call has been transferred to Access Nurse. Once the result note has been entered staff can address the message at that time.  Patient called in with the following symptoms:  Red Word:chest pain,sob,wheezing,rib pain   Please advise at [facility] [phone]  Message is routed to Provider Pool and [facility] Triage

## 2023-01-30 ENCOUNTER — Encounter: Payer: Self-pay | Admitting: Family Medicine

## 2023-01-30 ENCOUNTER — Ambulatory Visit (INDEPENDENT_AMBULATORY_CARE_PROVIDER_SITE_OTHER): Payer: Medicare HMO | Admitting: Family Medicine

## 2023-01-30 ENCOUNTER — Ambulatory Visit (INDEPENDENT_AMBULATORY_CARE_PROVIDER_SITE_OTHER)
Admission: RE | Admit: 2023-01-30 | Discharge: 2023-01-30 | Disposition: A | Discharge status: Home / Self Care | Payer: Medicare HMO | Source: Ambulatory Visit | Attending: Family Medicine | Admitting: Family Medicine

## 2023-01-30 VITALS — BP 122/58 | HR 65 | Temp 97.2°F | Ht 64.0 in | Wt 161.0 lb

## 2023-01-30 DIAGNOSIS — R0602 Shortness of breath: Secondary | ICD-10-CM | POA: Diagnosis not present

## 2023-01-30 DIAGNOSIS — R7989 Other specified abnormal findings of blood chemistry: Secondary | ICD-10-CM

## 2023-01-30 DIAGNOSIS — N390 Urinary tract infection, site not specified: Secondary | ICD-10-CM

## 2023-01-30 LAB — POC URINALSYSI DIPSTICK (AUTOMATED)
Bilirubin, UA: POSITIVE
Blood, UA: NEGATIVE
Glucose, UA: NEGATIVE
Ketones, UA: POSITIVE
Leukocytes, UA: NEGATIVE
Nitrite, UA: NEGATIVE
Protein, UA: POSITIVE — AB
Spec Grav, UA: >=1.03 — AB (ref 1.010–1.025)
Urobilinogen, UA: 0.2 U/dL
pH, UA: 5 (ref 5.0–8.0)

## 2023-01-30 LAB — BRAIN NATRIURETIC PEPTIDE: Brain Natriuretic Peptide: 65 pg/mL (ref <100)

## 2023-01-30 MED ORDER — ALBUTEROL SULFATE HFA 108 (90 BASE) MCG/ACT IN AERS: 1.0000 | INHALATION_SPRAY | Freq: Four times a day (QID) | RESPIRATORY_TRACT | 0 refills | Status: AC | PRN

## 2023-01-30 NOTE — Telephone Encounter (Signed)
Sending note to Allayne Gitelman as PCP who is out of office today and and Dr Ermalene Searing who pt has appt 01/30/23 at 3 pm. And [place] pool.

## 2023-01-30 NOTE — Progress Notes (Signed)
Patient ID: Shannon Higgins, female    DOB: 1934-10-07, 88 y.o.   MRN: [id number]  This visit was conducted in person.  BP (!) 122/58   Pulse 65   Temp (!) 97.2 F (36.2 C) (Temporal)   Ht 5\' 4"  (1.626 m)   Wt 161 lb (73 kg)   SpO2 97%   BMI 27.64 kg/m    CC:  Chief Complaint  Patient presents with   Shortness of Breath    Sob, rib pain, wheezing, fatigue since x 6 days     Subjective:   HPI: Shannon Higgins is a 88 y.o. female patient of Mayra Reel, NP with history of dementia, malignant melanoma, hypothyroidism, hypertension, anemia, diabetes and past ovarian cancer presenting on 01/30/2023 for Shortness of Breath (Sob, rib pain, wheezing, fatigue since x 6 days )  Today she presents with daughter reporting shortness of breath, wheezing and fatigue for 6 days. Mi;d ST 10 days ago.  No cough, no congestion,  no fever.  Some soreness at bilateral ribs when pressing, no pain with deep breaths.  No flu like symptoms. NO chest pain with walking.  No swelling in ankles.  No weight gain. Wt Readings from Last 3 Encounters:  01/30/23 161 lb (73 kg)  01/20/23 163 lb (73.9 kg)  01/19/23 162 lb (73.5 kg)     She has been treated for  recurrent UTI.[name] last dose of antibiotics septra 4 days ago.   No history of asthma... has allergies and bronchitic which occ using albuterol inhaler.  More spring allergies Past echocardiogram in 2022 EF normal 60 to 65%      Relevant past medical, surgical, family and social history reviewed and updated as indicated. Interim medical history since our last visit reviewed. Allergies and medications reviewed and updated. Outpatient Medications Prior to Visit  Medication Sig Dispense Refill   Accu-Chek FastClix Lancets MISC USE AS INSTRUCTED TO TEST BLOOD SUGAR DAILY 102 each 1   ACCU-CHEK GUIDE test strip 1 EACH BY IN VITRO ROUTE IN THE MORNING, AT NOON, AND AT BEDTIME. MAY SUBSTITUTE TO ANY MANUFACTURER COVERED BY PATIENT'S INSURANCE. 300  strip 0   aspirin EC 325 MG tablet Take 325 mg by mouth daily.     b complex vitamins capsule Take 1 capsule by mouth daily.     blood glucose meter kit and supplies Dispense based on patient and insurance preference. Use up to 2 times daily as directed. (FOR ICD-10 E10.9, E11.9). 1 each 0   Blood Glucose Monitoring Suppl DEVI 1 each by Does not apply route in the morning, at noon, and at bedtime. May substitute to any manufacturer covered by patient's insurance. 1 each 0   botulinum toxin Type A (BOTOX) 200 units injection Inject 155 units IM into multiple sites of face head and neck every 90 days 1 each 4   Cranberry 1000 MG CAPS Take 1 capsule by mouth 2 (two) times daily.     donepezil (ARICEPT) 10 MG tablet Take 1 tablet (10 mg total) by mouth at bedtime. 90 tablet 3   dorzolamide-timolol (COSOPT) 22.3-6.8 MG/ML ophthalmic solution Place 1 drop into both eyes 2 (two) times daily.     DULoxetine (CYMBALTA) 60 MG capsule TAKE 1 CAPSULE EVERY DAY FOR ANXIETY AND DEPRESSION. 90 capsule 2   estradiol (ESTRACE) 0.1 MG/GM vaginal cream Insert 1/4 finger full of cream into vagina and spread towards urethra at bedtime 3 nights a week 42.5 g 0   fluticasone (  FLONASE) 50 MCG/ACT nasal spray USE 2 SPRAYS IN EACH NOSTRIL DAILY AS NEEDED FOR ALLERGIES OR RHINITIS 48 g 2   gabapentin (NEURONTIN) 600 MG tablet TAKE 1 TABLET THREE TIMES DAILY FOR NEUROPATHY 270 tablet 2   Lancets Misc. MISC 1 each by Does not apply route in the morning, at noon, and at bedtime. May substitute to any manufacturer covered by patient's insurance. 300 each 0   latanoprost (XALATAN) 0.005 % ophthalmic solution Place 1 drop into both eyes at bedtime.     levothyroxine (SYNTHROID) 112 MCG tablet TAKE 1 TABLET EVERY MORNING MONDAY THROUGH SATURDAY; AND 1/2 TABLET ON SUNDAY. TAKE ON AN EMPTY STOMACH WITH WATER ONLY 78 tablet 1   meclizine (ANTIVERT) 25 MG tablet TAKE 1 TABLET (25 MG TOTAL) BY MOUTH 2 (TWO) TIMES DAILY AS NEEDED FOR  DIZZINESS. 180 tablet 0   meloxicam (MOBIC) 15 MG tablet TAKE 1 TABLET EVERY DAY AS NEEDED FOR PAIN 90 tablet 0   memantine (NAMENDA) 5 MG tablet Take 1 tablet twice a day 180 tablet 3   metFORMIN (GLUCOPHAGE-XR) 500 MG 24 hr tablet Take 1 tablet (500 mg total) by mouth daily with breakfast. for diabetes. 90 tablet 1   mirtazapine (REMERON) 15 MG tablet Take 1 tablet (15 mg total) by mouth at bedtime. For sleep 90 tablet 0   Netarsudil Dimesylate (RHOPRESSA) 0.02 % SOLN Place 1 drop into both eyes daily at 6 (six) AM.     nystatin cream (MYCOSTATIN) Apply 1 Application topically 2 (two) times daily as needed. 30 g 2   omeprazole (PRILOSEC) 20 MG capsule TAKE 1 CAPSULE TWICE DAILY FOR HEARTBURN. 180 capsule 2   pilocarpine (PILOCAR) 2 % ophthalmic solution Place 1 drop into both eyes 4 (four) times daily.      propranolol (INDERAL) 80 MG tablet TAKE 1 TABLET EVERY DAY AS DIRECTED FOR HEADACHE PREVENTION. 90 tablet 2   rosuvastatin (CRESTOR) 10 MG tablet TAKE 1 TABLET EVERY DAY FOR CHOLESTEROL 90 tablet 2   SUMAtriptan (IMITREX) 25 MG tablet Take 1 tablet (25 mg total) by mouth every 2 (two) hours as needed for migraine. May repeat in 2 hours if headache persists or recurs. 10 tablet 2   VITAMIN D PO Take by mouth.     albuterol (VENTOLIN HFA) 108 (90 Base) MCG/ACT inhaler Inhale 1-2 puffs into the lungs every 6 (six) hours as needed. 18 g 0   sulfamethoxazole-trimethoprim (BACTRIM DS) 800-160 MG tablet Take 1 tablet by mouth 2 (two) times daily. For urinary tract infection. 10 tablet 0   No facility-administered medications prior to visit.     Per HPI unless specifically indicated in ROS section below Review of Systems  Constitutional:  Positive for fatigue. Negative for fever.  HENT:  Negative for congestion.   Eyes:  Negative for pain.  Respiratory:  Positive for shortness of breath. Negative for cough.   Cardiovascular:  Negative for chest pain, palpitations and leg swelling.   Gastrointestinal:  Negative for abdominal pain.  Genitourinary:  Negative for dysuria and vaginal bleeding.  Musculoskeletal:  Negative for back pain.  Neurological:  Negative for syncope, light-headedness and headaches.  Psychiatric/Behavioral:  Negative for dysphoric mood.    Objective:  BP (!) 122/58   Pulse 65   Temp (!) 97.2 F (36.2 C) (Temporal)   Ht 5\' 4"  (1.626 m)   Wt 161 lb (73 kg)   SpO2 97%   BMI 27.64 kg/m   Wt Readings from Last 3 Encounters:  01/30/23 161 lb (73 kg)  01/20/23 163 lb (73.9 kg)  01/19/23 162 lb (73.5 kg)      Physical Exam Constitutional:      General: She is not in acute distress.    Appearance: Normal appearance. She is well-developed. She is not ill-appearing or toxic-appearing.  HENT:     Head: Normocephalic.     Right Ear: Hearing, tympanic membrane, ear canal and external ear normal. Tympanic membrane is not erythematous, retracted or bulging.     Left Ear: Hearing, tympanic membrane, ear canal and external ear normal. Tympanic membrane is not erythematous, retracted or bulging.     Nose: No mucosal edema or rhinorrhea.     Right Sinus: No maxillary sinus tenderness or frontal sinus tenderness.     Left Sinus: No maxillary sinus tenderness or frontal sinus tenderness.     Mouth/Throat:     Mouth: Oropharynx is clear and moist and mucous membranes are normal.     Pharynx: Uvula midline.  Eyes:     General: Lids are normal. Lids are everted, no foreign bodies appreciated.     Extraocular Movements: EOM normal.     Conjunctiva/sclera: Conjunctivae normal.     Pupils: Pupils are equal, round, and reactive to light.  Neck:     Thyroid: No thyroid mass or thyromegaly.     Vascular: No carotid bruit.     Trachea: Trachea normal.  Cardiovascular:     Rate and Rhythm: Normal rate and regular rhythm.     Pulses: Normal pulses.     Heart sounds: Normal heart sounds, S1 normal and S2 normal. No murmur heard.    No friction rub. No gallop.   Pulmonary:     Effort: Pulmonary effort is normal. No tachypnea or respiratory distress.     Breath sounds: Normal breath sounds. No decreased breath sounds, wheezing, rhonchi or rales.  Abdominal:     General: Bowel sounds are normal.     Palpations: Abdomen is soft.     Tenderness: There is no abdominal tenderness.  Musculoskeletal:     Cervical back: Normal range of motion and neck supple.  Skin:    General: Skin is warm, dry and intact.     Findings: No rash.  Neurological:     Mental Status: She is alert.  Psychiatric:        Mood and Affect: Mood is not anxious or depressed.        Speech: Speech normal.        Behavior: Behavior normal. Behavior is cooperative.        Thought Content: Thought content normal.        Cognition and Memory: Cognition and memory normal.        Judgment: Judgment normal.       Results for orders placed or performed in visit on 01/30/23  Brain natriuretic peptide  Result Value Ref Range   Brain Natriuretic Peptide 65 <100 pg/mL  CBC with Differential/Platelet  Result Value Ref Range   WBC 11.8 (H) 3.8 - 10.8 Thousand/uL   RBC 4.30 3.80 - 5.10 Million/uL   Hemoglobin 13.5 11.7 - 15.5 g/dL   HCT 95.6 21.3 - 08.6 %   MCV 97.7 80.0 - 100.0 fL   MCH 31.4 27.0 - 33.0 pg   MCHC 32.1 32.0 - 36.0 g/dL   RDW 57.8 46.9 - 62.9 %   Platelets 315 140 - 400 Thousand/uL   MPV 10.1 7.5 - 12.5 fL   Neutro  Abs 6,950 1,500 - 7,800 cells/uL   Lymphs Abs 3,316 850 - 3,900 cells/uL   Absolute Monocytes 1,180 (H) 200 - 950 cells/uL   Eosinophils Absolute 307 15 - 500 cells/uL   Basophils Absolute 47 0 - 200 cells/uL   Neutrophils Relative % 58.9 %   Total Lymphocyte 28.1 %   Monocytes Relative 10.0 %   Eosinophils Relative 2.6 %   Basophils Relative 0.4 %  TSH  Result Value Ref Range   TSH 0.38 (L) 0.40 - 4.50 mIU/L  POCT Urinalysis Dipstick (Automated)  Result Value Ref Range   Color, UA yellow    Clarity, UA clear    Glucose, UA Negative Negative    Bilirubin, UA pos    Ketones, UA pos    Spec Grav, UA >=1.030 (A) 1.010 - 1.025   Blood, UA neg    pH, UA 5.0 5.0 - 8.0   Protein, UA Positive (A) Negative   Urobilinogen, UA 0.2 0.2 or 1.0 E.U./dL   Nitrite, UA neg    Leukocytes, UA Negative Negative    Assessment and Plan  SOB (shortness of breath) Assessment & Plan: Acute, no clear sign of infection.  No clear sign of fluid overload given no peripheral edema. EKG in office showed normal sinus rhythm with nonspecific ST changes, no specific change compared to previous. Will evaluate with chest x-ray as well as labs for possible secondary cause.  Return and ER precautions provided.  Orders: -     DG Chest 2 View; Future -     EKG 12-Lead -     Brain natriuretic peptide -     CBC with Differential/Platelet -     TSH  Recurrent UTI Assessment & Plan: Will evaluate for resolution of urinary tract infection with urinalysis.  Orders: -     POCT Urinalysis Dipstick (Automated)  Other orders -     Albuterol Sulfate HFA; Inhale 1-2 puffs into the lungs every 6 (six) hours as needed.  Dispense: 18 g; Refill: 0    No follow-ups on file.   Kerby Nora, MD

## 2023-01-30 NOTE — Assessment & Plan Note (Signed)
Acute, no clear sign of infection.  No clear sign of fluid overload given no peripheral edema. EKG in office showed normal sinus rhythm with nonspecific ST changes, no specific change compared to previous. Will evaluate with chest x-ray as well as labs for possible secondary cause.  Return and ER precautions provided.

## 2023-01-30 NOTE — Assessment & Plan Note (Signed)
Will evaluate for resolution of urinary tract infection with urinalysis.

## 2023-01-31 NOTE — Addendum Note (Signed)
Addended by: Alvina Chou on: 01/31/2023 07:26 AM   Modules accepted: Orders

## 2023-01-31 NOTE — Telephone Encounter (Signed)
Noted  

## 2023-02-01 LAB — CBC WITH DIFFERENTIAL/PLATELET
Absolute Monocytes: 1180 {cells}/uL — ABNORMAL HIGH (ref 200–950)
Basophils Absolute: 47 {cells}/uL (ref 0–200)
Basophils Relative: 0.4 %
Eosinophils Absolute: 307 {cells}/uL (ref 15–500)
Eosinophils Relative: 2.6 %
HCT: 42 % (ref 35.0–45.0)
Hemoglobin: 13.5 g/dL (ref 11.7–15.5)
Lymphs Abs: 3316 {cells}/uL (ref 850–3900)
MCH: 31.4 pg (ref 27.0–33.0)
MCHC: 32.1 g/dL (ref 32.0–36.0)
MCV: 97.7 fL (ref 80.0–100.0)
MPV: 10.1 fL (ref 7.5–12.5)
Monocytes Relative: 10 %
Neutro Abs: 6950 {cells}/uL (ref 1500–7800)
Neutrophils Relative %: 58.9 %
Platelets: 315 10*3/uL (ref 140–400)
RBC: 4.3 10*6/uL (ref 3.80–5.10)
RDW: 12.8 % (ref 11.0–15.0)
Total Lymphocyte: 28.1 %
WBC: 11.8 10*3/uL — ABNORMAL HIGH (ref 3.8–10.8)

## 2023-02-01 LAB — TSH: TSH: 0.38 m[IU]/L — ABNORMAL LOW (ref 0.40–4.50)

## 2023-02-01 LAB — T3, FREE: T3, Free: 2.2 pg/mL — ABNORMAL LOW (ref 2.3–4.2)

## 2023-02-01 LAB — T4, FREE: Free T4: 1.4 ng/dL (ref 0.8–1.8)

## 2023-02-01 LAB — TEST AUTHORIZATION

## 2023-02-03 ENCOUNTER — Other Ambulatory Visit: Payer: Self-pay | Admitting: Primary Care

## 2023-02-03 DIAGNOSIS — G4700 Insomnia, unspecified: Secondary | ICD-10-CM

## 2023-02-08 ENCOUNTER — Other Ambulatory Visit: Payer: Self-pay | Admitting: Neurology

## 2023-02-09 ENCOUNTER — Other Ambulatory Visit: Payer: Self-pay | Admitting: Primary Care

## 2023-02-09 DIAGNOSIS — G4700 Insomnia, unspecified: Secondary | ICD-10-CM

## 2023-02-10 ENCOUNTER — Ambulatory Visit: Payer: Medicare PPO | Admitting: Neurology

## 2023-02-10 DIAGNOSIS — Z961 Presence of intraocular lens: Secondary | ICD-10-CM | POA: Diagnosis not present

## 2023-02-10 DIAGNOSIS — H401131 Primary open-angle glaucoma, bilateral, mild stage: Secondary | ICD-10-CM | POA: Diagnosis not present

## 2023-02-17 ENCOUNTER — Ambulatory Visit: Payer: Medicare HMO | Admitting: Neurology

## 2023-02-17 DIAGNOSIS — G43709 Chronic migraine without aura, not intractable, without status migrainosus: Secondary | ICD-10-CM

## 2023-02-17 MED ORDER — ONABOTULINUMTOXINA 100 UNITS IJ SOLR
200.0000 [IU] | Freq: Once | INTRAMUSCULAR | Status: AC
Start: 2023-02-17 — End: 2023-02-17
  Administered 2023-02-17: 155 [IU] via INTRAMUSCULAR

## 2023-02-17 NOTE — Progress Notes (Signed)
[facility]  ° °Procedure Note Botox ° °Attending: Dr. Landi Biscardi ° °Preoperative Diagnosis(es): Chronic migraine ° °Consent obtained from: The patient °Benefits discussed included, but were not limited to decreased muscle tightness, increased joint range of motion, and decreased pain.  Risk discussed included, but were not limited pain and discomfort, bleeding, bruising, excessive weakness, venous thrombosis, muscle atrophy and dysphagia.  Anticipated outcomes of the procedure as well as he risks and benefits of the alternatives to the procedure, and the roles and tasks of the personnel to be involved, were discussed with the patient, and the patient consents to the procedure and agrees to proceed. A copy of the patient medication guide was given to the patient which explains the blackbox warning. ° °Patients identity and treatment sites confirmed Yes.  . ° °Details of Procedure: °Skin was cleaned with alcohol. Prior to injection, the needle plunger was aspirated to make sure the needle was not within a blood vessel.  There was no blood retrieved on aspiration.   ° °Following is a summary of the muscles injected  And the amount of Botulinum toxin used: ° °Dilution °200 units of Botox was reconstituted with 4 ml of preservative free normal saline. °Time of reconstitution: At the time of the office visit (<30 minutes prior to injection)  ° °Injections  °155 total units of Botox was injected with a 30 gauge needle. ° °Injection Sites: °L occipitalis: 15 units- 3 sites  °R occiptalis: 15 units- 3 sites ° °L upper trapezius: 15 units- 3 sites °R upper trapezius: 15 units- 3 sits          °L paraspinal: 10 units- 2 sites °R paraspinal: 10 units- 2 sites ° °Face °L frontalis(2 injection sites):10 units   °R frontalis(2 injection sites):10 units         °L corrugator: 5 units   °R corrugator: 5 units           °Procerus: 5 units   °L temporalis: 20 units °R temporalis: 20 units  ° °Agent:  °200 units of botulinum Type  A (Onobotulinum Toxin type A) was reconstituted with 4 ml of preservative free normal saline.  °Time of reconstitution: At the time of the office visit (<30 minutes prior to injection)  ° ° ° Total injected (Units):  155 ° Total wasted (Units):  45 ° °Patient tolerated procedure well without complications.   °Reinjection is anticipated in 3 months. ° ° °

## 2023-02-18 ENCOUNTER — Other Ambulatory Visit: Payer: Self-pay | Admitting: Family Medicine

## 2023-03-17 ENCOUNTER — Inpatient Hospital Stay: Payer: Medicare HMO | Attending: Oncology | Admitting: Nurse Practitioner

## 2023-03-21 ENCOUNTER — Other Ambulatory Visit: Payer: Self-pay | Admitting: Nurse Practitioner

## 2023-03-22 ENCOUNTER — Other Ambulatory Visit: Payer: Self-pay | Admitting: Primary Care

## 2023-03-22 DIAGNOSIS — E785 Hyperlipidemia, unspecified: Secondary | ICD-10-CM

## 2023-03-22 DIAGNOSIS — K219 Gastro-esophageal reflux disease without esophagitis: Secondary | ICD-10-CM

## 2023-03-22 DIAGNOSIS — R519 Headache, unspecified: Secondary | ICD-10-CM

## 2023-03-22 DIAGNOSIS — R42 Dizziness and giddiness: Secondary | ICD-10-CM

## 2023-03-22 DIAGNOSIS — F331 Major depressive disorder, recurrent, moderate: Secondary | ICD-10-CM

## 2023-03-23 ENCOUNTER — Other Ambulatory Visit: Payer: Self-pay | Admitting: Neurology

## 2023-03-23 DIAGNOSIS — F03A0 Unspecified dementia, mild, without behavioral disturbance, psychotic disturbance, mood disturbance, and anxiety: Secondary | ICD-10-CM

## 2023-03-30 ENCOUNTER — Other Ambulatory Visit: Payer: Self-pay | Admitting: Primary Care

## 2023-03-30 DIAGNOSIS — G8929 Other chronic pain: Secondary | ICD-10-CM

## 2023-04-03 ENCOUNTER — Other Ambulatory Visit: Payer: Self-pay | Admitting: Neurology

## 2023-04-04 ENCOUNTER — Inpatient Hospital Stay: Payer: Medicare HMO | Attending: Oncology | Admitting: Nurse Practitioner

## 2023-04-04 ENCOUNTER — Encounter: Payer: Self-pay | Admitting: Nurse Practitioner

## 2023-04-04 ENCOUNTER — Other Ambulatory Visit: Payer: Self-pay

## 2023-04-04 VITALS — BP 155/87 | HR 76 | Temp 97.4°F | Wt 161.0 lb

## 2023-04-04 DIAGNOSIS — Z8543 Personal history of malignant neoplasm of ovary: Secondary | ICD-10-CM | POA: Insufficient documentation

## 2023-04-04 DIAGNOSIS — N390 Urinary tract infection, site not specified: Secondary | ICD-10-CM

## 2023-04-04 DIAGNOSIS — R102 Pelvic and perineal pain: Secondary | ICD-10-CM

## 2023-04-04 LAB — URINALYSIS, COMPLETE (UACMP) WITH MICROSCOPIC
Glucose, UA: NEGATIVE mg/dL
Hgb urine dipstick: NEGATIVE
Ketones, ur: 5 mg/dL — AB
Nitrite: NEGATIVE
Protein, ur: >=300 mg/dL — AB
Specific Gravity, Urine: 1.027 (ref 1.005–1.030)
WBC, UA: >50 WBC/hpf (ref 0–5)
pH: 5 (ref 5.0–8.0)

## 2023-04-04 MED ORDER — ESTRADIOL 0.1 MG/GM VA CREA: 1.0000 | TOPICAL_CREAM | VAGINAL | 3 refills | Status: DC

## 2023-04-04 NOTE — Progress Notes (Signed)
[facility]pt[facility] Management [facility]  [facility] at [facility] A Department of the [place]. [facility] [address] [place], [place] [address] [phone] (phone) [phone] (fax)  Patient Care Team: Doreene Nest, NP as PCP - General (Internal Medicine) Van Clines, MD as Consulting Physician (Neurology) Benita Gutter, RN as Oncology Nurse Navigator [place], Garry Heater, [facility] (Inactive) as Pharmacist (Pharmacist) Van Clines, MD as Consulting Physician (Neurology)   Name of the patient: Shannon Higgins  [id number]  [birth year]   Date of visit: 04/04/23  Diagnosis- Ovarian Cancer  Chief complaint/ Reason for visit- Vaginal pain  Heme/Onc history:  Oncology History  High grade ovarian cancer (HCC)  07/21/2020 Initial Diagnosis   Ovarian cancer, bilateral   07/21/2020 Cancer Staging   Staging form: Ovary, Fallopian Tube, and Primary Peritoneal Carcinoma, AJCC 8th Edition - Pathologic stage from 07/21/2020: FIGO Stage IC, calculated as Stage Unknown (pT1c, pNX, cM0) - Signed by Creig Hines, MD on 07/24/2020 Gross residual tumor after primary cyto-reductive surgery: Absent   08/04/2020 - 10/27/2020 Chemotherapy   Patient is on Treatment Plan : BREAST Carboplatin (AUC 5) 21d      Genetic Testing   Negative genetic testing. No pathogenic variants identified on the [facility] panel. HRD has not been performed but can be if needed. VUS in HOXB13 called [id number] identified. The report date is 09/21/2020.  The [facility] gene panel offered by [employer] includes sequencing and deletion/duplication testing of the following 45 genes: APC, ATM, AXIN2, BAP1, BARD1, BMPR1A, BRCA1, BRCA2, BRIP1, CDH1, CDK4, CDKN2A, CHEK2, CTNNA1, FH, FLCN, HOXB13 (seq only), MEN1, MET, MLH1, MSH2, MSH3 (excluding repetitive portions of exon 1), MSH6, MUTYH, NTHL1, PALB2, PMS2, PTEN, RAD51C, RAD51D, SDHA, SDHB, SDHC, SDHD, SMAD4, STK11,  TP53, TSC1, TSC2, VHL.     Interval history- Patient is 87 year old female with history of ovarian cancer who returns to clinic for complaints of vaginal pressure. She was previously positive for UTI, treated and symptoms resolved. Symptoms have now recurred. Pain is primarily at vaginal introitus. She doesn't feel like she fully empties her bladder. Has been using estrogen as recommended. Denies fevers or chills. Denies worsening pain. No changes in bowel movements.   Review of systems- Review of Systems  Constitutional:  Positive for malaise/fatigue. Negative for chills, fever and weight loss.  HENT:  Negative for hearing loss, nosebleeds, sore throat and tinnitus.   Eyes:  Negative for blurred vision and double vision.  Respiratory:  Negative for cough, hemoptysis, shortness of breath and wheezing.   Cardiovascular:  Negative for chest pain, palpitations and leg swelling.  Gastrointestinal:  Negative for abdominal pain, blood in stool, constipation, diarrhea, melena, nausea and vomiting.  Genitourinary:  Negative for dysuria, flank pain, frequency, hematuria and urgency.       Per hpi  Musculoskeletal:  Negative for back pain, falls, joint pain and myalgias.  Skin:  Negative for itching and rash.  Neurological:  Negative for dizziness, tingling, sensory change, loss of consciousness, weakness and headaches.  Endo/Heme/Allergies:  Negative for environmental allergies. Does not bruise/bleed easily.  Psychiatric/Behavioral:  Negative for depression. The patient is not nervous/anxious and does not have insomnia.      Current treatment- surveillance  No Known Allergies  Past Medical History:  Diagnosis Date   Acute bronchitis 05/13/2022   Acute metabolic encephalopathy 04/23/2020   Acute tubular injury of transplanted kidney (HCC) 06/14/2016   AKI (acute kidney injury) (HCC) 06/14/2016  Asthma    Breast cancer (HCC) 2015   left   Bronchitis    CAP (community acquired pneumonia)  04/29/2012   Chronic sinusitis    Closed fracture of medial malleolus 05/11/2018   Confusion 12/06/2016   COVID-19 virus infection 12/17/2020   Diabetes mellitus without complication [facility])    Family history of breast cancer    GERD (gastroesophageal reflux disease)    History of ankle fracture 05/14/2018   Last Assessment & Plan:  With a recent fall. Right medial malleous. Has ortho follow up soon, splinted now and tylenol helps her pain   History of recurrent UTIs    Hypertension    Hypokalemia 04/26/2012   Hyponatremia    Hypothyroidism    Imbalance 09/02/2019   Insomnia    Migraine    Neuropathic pain    Ovarian cancer (HCC) 2022   Ovarian mass, left 07/14/2020   Personal history of breast cancer    Personal history of chemotherapy current   bilateral ovarian ca   Pneumonia    Rheumatoid arthritis (HCC)    Sepsis (HCC) 04/26/2012   Sepsis secondary to UTI (HCC) 04/26/2012   Sleep apnea    Stroke [facility])    seen in CT scan   Thyroid disease    Transient hypotension 06/14/2016    Past Surgical History:  Procedure Laterality Date   ABDOMINAL HYSTERECTOMY     BACK SURGERY     BREAST BIOPSY Right ?`   benign   BREAST LUMPECTOMY Left 2015   breast ca   CHOLECYSTECTOMY     JOINT REPLACEMENT     LAPAROTOMY N/A 07/14/2020   Procedure: LAPAROTOMY;  Surgeon: Nadara Mustard, MD;  Location: [facility] ORS;  Service: Gynecology;  Laterality: N/A;   OOPHORECTOMY     OVARIAN CYST REMOVAL     PORTA CATH INSERTION N/A 07/27/2020   Procedure: PORTA CATH INSERTION;  Surgeon: Annice Needy, MD;  Location: [facility] INVASIVE CV LAB;  Service: Cardiovascular;  Laterality: N/A;   PORTA CATH REMOVAL N/A 04/05/2021   Procedure: PORTA CATH REMOVAL;  Surgeon: Annice Needy, MD;  Location: [facility] INVASIVE CV LAB;  Service: Cardiovascular;  Laterality: N/A;   REPLACEMENT TOTAL KNEE Right    SALPINGOOPHORECTOMY Bilateral 07/14/2020   Procedure: OPEN SALPINGO OOPHORECTOMY;  Surgeon: Nadara Mustard, MD;   Location: [facility] ORS;  Service: Gynecology;  Laterality: Bilateral;   TOTAL SHOULDER REPLACEMENT Left     Social History   Socioeconomic History   Marital status: Widowed    Spouse name: Not on file   Number of children: 2   Years of education: Not on file   Highest education level: Some college, no degree  Occupational History   Occupation: retired   Tobacco Use   Smoking status: Never   Smokeless tobacco: Never  Vaping Use   Vaping status: Never Used  Substance and Sexual Activity   Alcohol use: No   Drug use: No   Sexual activity: Not Currently  Other Topics Concern   Not on file  Social History Narrative   Pt lives in 1 story home with her daughter, [name] and [name]'s husband   Has 2 adult daughters   Highest level of education: some college   Worked mainly as Environmental health practitioner.   Social Determinants of Health   Financial Resource Strain: Low Risk  (08/24/2022)   Overall Financial Resource Strain (CARDIA)    Difficulty of Paying Living Expenses: Not hard at all  Food Insecurity: No Food  Insecurity (08/24/2022)   Hunger Vital Sign    Worried About Running Out of Food in the Last Year: Never true    Ran Out of Food in the Last Year: Never true  Transportation Needs: No Transportation Needs (08/24/2022)   PRAPARE - Administrator, Civil Service (Medical): No    Lack of Transportation (Non-Medical): No  Physical Activity: Unknown (08/24/2022)   Exercise Vital Sign    Days of Exercise per Week: 0 days    Minutes of Exercise per Session: Not on file  Recent Concern: Physical Activity - Inactive (08/24/2022)   Exercise Vital Sign    Days of Exercise per Week: 0 days    Minutes of Exercise per Session: 0 min  Stress: No Stress Concern Present (08/24/2022)   [employer] of Occupational Health - Occupational Stress Questionnaire    Feeling of Stress : Not at all  Social Connections: Socially Isolated (08/24/2022)   Social Connection and Isolation Panel  [NHANES]    Frequency of Communication with Friends and Family: More than three times a week    Frequency of Social Gatherings with Friends and Family: More than three times a week    Attends Religious Services: Never    Database administrator or Organizations: No    Attends Banker Meetings: Not on file    Marital Status: Widowed  Intimate Partner Violence: Not At Risk (03/01/2022)   Humiliation, Afraid, Rape, and Kick questionnaire    Fear of Current or Ex-Partner: No    Emotionally Abused: No    Physically Abused: No    Sexually Abused: No    Family History  Problem Relation Age of Onset   Diabetes Daughter    Hypertension Daughter    Fibromyalgia Daughter    GER disease Daughter    Fibromyalgia Daughter    Crohn's disease Daughter    Asthma Daughter    Hypertension Mother    Breast cancer Other    Breast cancer Niece    Uterine cancer Niece      Current Outpatient Medications:    Accu-Chek FastClix Lancets MISC, USE AS INSTRUCTED TO TEST BLOOD SUGAR DAILY, Disp: 102 each, Rfl: 1   ACCU-CHEK GUIDE test strip, 1 EACH BY IN VITRO ROUTE IN THE MORNING, AT NOON, AND AT BEDTIME. MAY SUBSTITUTE TO ANY MANUFACTURER COVERED BY PATIENT'S INSURANCE., Disp: 300 strip, Rfl: 0   albuterol (VENTOLIN HFA) 108 (90 Base) MCG/ACT inhaler, Inhale 1-2 puffs into the lungs every 6 (six) hours as needed., Disp: 18 g, Rfl: 0   aspirin EC 325 MG tablet, Take 325 mg by mouth daily., Disp: , Rfl:    b complex vitamins capsule, Take 1 capsule by mouth daily., Disp: , Rfl:    blood glucose meter kit and supplies, Dispense based on patient and insurance preference. Use up to 2 times daily as directed. (FOR ICD-10 E10.9, E11.9)., Disp: 1 each, Rfl: 0   Blood Glucose Monitoring Suppl DEVI, 1 each by Does not apply route in the morning, at noon, and at bedtime. May substitute to any manufacturer covered by patient's insurance., Disp: 1 each, Rfl: 0   botulinum toxin Type A (BOTOX) 200 units  injection, Inject 155 units IM into multiple sites of face head and neck every 90 days, Disp: 1 each, Rfl: 4   Cranberry 1000 MG CAPS, Take 1 capsule by mouth 2 (two) times daily., Disp: , Rfl:    donepezil (ARICEPT) 10 MG tablet, TAKE 1 TABLET  BY MOUTH EVERYDAY AT BEDTIME, Disp: 90 tablet, Rfl: 0   dorzolamide-timolol (COSOPT) 22.3-6.8 MG/ML ophthalmic solution, Place 1 drop into both eyes 2 (two) times daily., Disp: , Rfl:    DULoxetine (CYMBALTA) 60 MG capsule, TAKE 1 CAPSULE EVERY DAY FOR ANXIETY AND DEPRESSION., Disp: 90 capsule, Rfl: 2   estradiol (ESTRACE) 0.1 MG/GM vaginal cream, Insert 1/4 finger full of cream into vagina and spread towards urethra at bedtime 3 nights a week, Disp: 42.5 g, Rfl: 0   fluticasone (FLONASE) 50 MCG/ACT nasal spray, USE 2 SPRAYS IN EACH NOSTRIL DAILY AS NEEDED FOR ALLERGIES OR RHINITIS, Disp: 48 g, Rfl: 2   Lancets Misc. MISC, 1 each by Does not apply route in the morning, at noon, and at bedtime. May substitute to any manufacturer covered by patient's insurance., Disp: 300 each, Rfl: 0   latanoprost (XALATAN) 0.005 % ophthalmic solution, Place 1 drop into both eyes at bedtime., Disp: , Rfl:    levothyroxine (SYNTHROID) 112 MCG tablet, TAKE 1 TABLET EVERY MORNING MONDAY THROUGH SATURDAY; AND 1/2 TABLET ON SUNDAY. TAKE ON AN EMPTY STOMACH WITH WATER ONLY, Disp: 78 tablet, Rfl: 1   meclizine (ANTIVERT) 25 MG tablet, TAKE 1 TABLET (25 MG TOTAL) BY MOUTH 2 (TWO) TIMES DAILY AS NEEDED FOR DIZZINESS., Disp: 180 tablet, Rfl: 0   meloxicam (MOBIC) 15 MG tablet, TAKE 1 TABLET EVERY DAY AS NEEDED FOR PAIN, Disp: 90 tablet, Rfl: 0   memantine (NAMENDA) 5 MG tablet, TAKE 1 TABLET BY MOUTH TWICE A DAY, Disp: 60 tablet, Rfl: 0   metFORMIN (GLUCOPHAGE-XR) 500 MG 24 hr tablet, Take 1 tablet (500 mg total) by mouth daily with breakfast. for diabetes., Disp: 90 tablet, Rfl: 1   mirtazapine (REMERON) 15 MG tablet, TAKE 1 TABLET AT BEDTIME FOR SLEEP, Disp: 90 tablet, Rfl: 0    Netarsudil Dimesylate (RHOPRESSA) 0.02 % SOLN, Place 1 drop into both eyes daily at 6 (six) AM., Disp: , Rfl:    nystatin cream (MYCOSTATIN), Apply 1 Application topically 2 (two) times daily as needed., Disp: 30 g, Rfl: 2   omeprazole (PRILOSEC) 20 MG capsule, TAKE 1 CAPSULE TWICE DAILY FOR HEARTBURN., Disp: 180 capsule, Rfl: 2   pilocarpine (PILOCAR) 2 % ophthalmic solution, Place 1 drop into both eyes 4 (four) times daily. , Disp: , Rfl:    propranolol (INDERAL) 80 MG tablet, TAKE 1 TABLET EVERY DAY AS DIRECTED FOR HEADACHE PREVENTION., Disp: 90 tablet, Rfl: 2   rosuvastatin (CRESTOR) 10 MG tablet, TAKE 1 TABLET EVERY DAY FOR CHOLESTEROL, Disp: 90 tablet, Rfl: 2   SUMAtriptan (IMITREX) 25 MG tablet, Take 1 tablet (25 mg total) by mouth every 2 (two) hours as needed for migraine. May repeat in 2 hours if headache persists or recurs., Disp: 10 tablet, Rfl: 2   VITAMIN D PO, Take by mouth., Disp: , Rfl:    gabapentin (NEURONTIN) 600 MG tablet, TAKE 1 TABLET THREE TIMES DAILY FOR NEUROPATHY, Disp: 270 tablet, Rfl: 2  Physical exam:  Vitals:   04/04/23 1411  BP: (!) 155/87  Pulse: 76  Temp: (!) 97.4 F (36.3 C)  TempSrc: Tympanic  SpO2: 97%  Weight: 161 lb (73 kg)   Physical Exam Vitals reviewed.  Constitutional:      Appearance: She is not ill-appearing.  Cardiovascular:     Rate and Rhythm: Normal rate and regular rhythm.  Pulmonary:     Effort: No respiratory distress.  Abdominal:     General: There is no distension.  Tenderness: There is no abdominal tenderness. There is no right CVA tenderness, left CVA tenderness or guarding.  Musculoskeletal:        General: No deformity.  Skin:    Findings: No rash.  Neurological:     Mental Status: She is alert and oriented to person, place, and time.  Psychiatric:        Mood and Affect: Mood normal.        Behavior: Behavior normal.   Pelvic: previously performed.   No results found.  Assessment and plan- Patient is a 88 y.o.  female with history of ovarian cancer, on surveillance, who returns to clinic for complaints of:   Recurrent UTI- question if atrophy and mild cystocele contributing. Urine culture positive for > 100,000 colories of e coli. Pansensitive. She has unfortunately had recurrent uti despite starting vaginal estrogen. Recommend treatment for acute uti then starting preventative dosing. She will continue topical estrogen for plumping effect. I'll also refer her to uro-gyn for evaluation. Start bactrim DS, 1 tablet twice daily x 7 days then 1/2 tablet once daily for prophylaxis.  Vaginal discomfort- at vaginal introitus. Question acute uti vs atrophy vs levator spasms. Not worsening. If it does, consider imaging given her history of ovarian cancer. Refer to uro-gyn.  Disposition:  2- 3 mo- see me for recurrent uti/vaginal pain f/u- la   Visit Diagnosis 1. Vaginal pain    Patient expressed understanding and was in agreement with this plan. She also understands that She can call clinic at any time with any questions, concerns, or complaints.   Thank you for allowing me to participate in the care of this very pleasant patient.   Consuello Masse, DNP, AGNP-C, AOCNP [facility] [phone]

## 2023-04-05 ENCOUNTER — Other Ambulatory Visit: Payer: Self-pay | Admitting: *Deleted

## 2023-04-05 DIAGNOSIS — N390 Urinary tract infection, site not specified: Secondary | ICD-10-CM

## 2023-04-06 LAB — URINE CULTURE: Culture: >=100000 — AB

## 2023-04-07 ENCOUNTER — Other Ambulatory Visit: Payer: Self-pay

## 2023-04-07 DIAGNOSIS — N390 Urinary tract infection, site not specified: Secondary | ICD-10-CM

## 2023-04-07 DIAGNOSIS — R102 Pelvic and perineal pain: Secondary | ICD-10-CM | POA: Diagnosis not present

## 2023-04-07 DIAGNOSIS — Z8543 Personal history of malignant neoplasm of ovary: Secondary | ICD-10-CM | POA: Diagnosis not present

## 2023-04-07 LAB — URINALYSIS, COMPLETE (UACMP) WITH MICROSCOPIC
Bilirubin Urine: NEGATIVE
Glucose, UA: NEGATIVE mg/dL
Hgb urine dipstick: NEGATIVE
Ketones, ur: NEGATIVE mg/dL
Leukocytes,Ua: NEGATIVE
Nitrite: NEGATIVE
Protein, ur: NEGATIVE mg/dL
RBC / HPF: 0 RBC/hpf (ref 0–5)
Specific Gravity, Urine: 1.012 (ref 1.005–1.030)
pH: 5 (ref 5.0–8.0)

## 2023-04-09 LAB — URINE CULTURE: Culture: >=100000 — AB

## 2023-04-10 ENCOUNTER — Other Ambulatory Visit: Payer: Self-pay | Admitting: Nurse Practitioner

## 2023-04-10 DIAGNOSIS — Z8543 Personal history of malignant neoplasm of ovary: Secondary | ICD-10-CM

## 2023-04-10 DIAGNOSIS — N390 Urinary tract infection, site not specified: Secondary | ICD-10-CM

## 2023-04-10 MED ORDER — SULFAMETHOXAZOLE-TRIMETHOPRIM 800-160 MG PO TABS: 1.0000 | ORAL_TABLET | Freq: Two times a day (BID) | ORAL | 0 refills | Status: AC

## 2023-04-10 MED ORDER — SULFAMETHOXAZOLE-TRIMETHOPRIM 800-160 MG PO TABS: 0.5000 | ORAL_TABLET | Freq: Every day | ORAL | 2 refills | Status: DC

## 2023-04-10 NOTE — Progress Notes (Signed)
UA, culture and sensitivity reviewed. Bactrim treatment dosing sent as well as prophylactic dosing for continuous use. She continues topical estrogen. Recommend referral to uro-gyn for input.

## 2023-04-15 ENCOUNTER — Other Ambulatory Visit: Payer: Self-pay | Admitting: Primary Care

## 2023-04-15 DIAGNOSIS — R42 Dizziness and giddiness: Secondary | ICD-10-CM

## 2023-04-15 MED ORDER — MECLIZINE HCL 25 MG PO TABS
25.0000 mg | ORAL_TABLET | Freq: Two times a day (BID) | ORAL | 0 refills | Status: AC | PRN
Start: 2023-04-15 — End: ?

## 2023-04-17 ENCOUNTER — Other Ambulatory Visit: Payer: Self-pay | Admitting: Primary Care

## 2023-04-17 DIAGNOSIS — E1142 Type 2 diabetes mellitus with diabetic polyneuropathy: Secondary | ICD-10-CM

## 2023-04-17 DIAGNOSIS — Z1231 Encounter for screening mammogram for malignant neoplasm of breast: Secondary | ICD-10-CM

## 2023-04-20 ENCOUNTER — Ambulatory Visit (INDEPENDENT_AMBULATORY_CARE_PROVIDER_SITE_OTHER): Payer: Medicare HMO | Admitting: Family Medicine

## 2023-04-20 VITALS — BP 120/70 | HR 68 | Temp 98.3°F | Ht 64.0 in | Wt 160.5 lb

## 2023-04-20 DIAGNOSIS — R531 Weakness: Secondary | ICD-10-CM | POA: Diagnosis not present

## 2023-04-20 NOTE — Assessment & Plan Note (Addendum)
Acute, Recent urinary tract infection symptoms have improved with completion of antibiotic course. She has not been drinking liquids as well as usual.  No overt signs in office today of dehydration, IUD blood pressure and pulse are in the normal range. Encouraged her to push water intake given she is not having nausea and vomiting. Will evaluate with labs for possible secondary cause of dizziness. Given symptoms sound more like lightheadedness, weakness and agrees to vertigo, I have asked them to stop meclizine.  This medication can cause sedation and weakness as well and may be exacerbating her symptoms

## 2023-04-20 NOTE — Progress Notes (Signed)
Patient ID: ROSELY KALLA, female    DOB: [birth year], 88 y.o.   MRN: [id number]  This visit was conducted in person.  BP 120/70 (BP Location: Right Arm, Patient Position: Sitting, Cuff Size: Normal)   Pulse 68   Temp 98.3 F (36.8 C) (Temporal)   Ht 5\' 4"  (1.626 m)   Wt 160 lb 8 oz (72.8 kg)   SpO2 98%   BMI 27.55 kg/m    CC:  Chief Complaint  Patient presents with   Dizziness    Recently treated for UTI Dizziness x 1 week    Subjective:   HPI: DASHELL RYON is a 88 y.o. female patient of Mayra Reel  with history of  ovarian cancer, DM, anemia major depressive disorder, hypothyroidism presenting on 04/20/2023 for Dizziness (Recently treated for UTI/Dizziness x 1 week)  Recent office visit on April 04, 2023 for recurrent UTI/vaginal pain Urine culture was positive for E. coli, pansensitive Recommend continuation of topical estrogen, referred to urogynecology for further evaluation. Treated with Bactrim double strength 1 tablet twice daily x 7 days then half tablet once daily for prophylaxis.   She has urine odor  And vaginal pressureimprovement, had had some prior diarrhea... now better.    Now in last week new onset dizziness... feeling  fatigue, blurry vision.. feels generalized weakness.  Symptoms are continuous.  No chest pain, no abdominal pain, no fever.  No bleeding.   She is eating well, not drinking as well as as usual,  drinking sweet tea, caffeine  Has been using meclizine 25 mg   2-3 times day.  No pretty sure rhetoric is 9-10 and then alumni 10-11 dialectic is 8-9       Relevant past medical, surgical, family and social history reviewed and updated as indicated. Interim medical history since our last visit reviewed. Allergies and medications reviewed and updated. Outpatient Medications Prior to Visit  Medication Sig Dispense Refill   Accu-Chek FastClix Lancets MISC USE AS INSTRUCTED TO TEST BLOOD SUGAR DAILY 102 each 1   ACCU-CHEK GUIDE test  strip 1 EACH BY IN VITRO ROUTE IN THE MORNING, AT NOON, AND AT BEDTIME. MAY SUBSTITUTE TO ANY MANUFACTURER COVERED BY PATIENT'S INSURANCE. 300 strip 0   albuterol (VENTOLIN HFA) 108 (90 Base) MCG/ACT inhaler Inhale 1-2 puffs into the lungs every 6 (six) hours as needed. 18 g 0   aspirin EC 325 MG tablet Take 325 mg by mouth daily.     b complex vitamins capsule Take 1 capsule by mouth daily.     blood glucose meter kit and supplies Dispense based on patient and insurance preference. Use up to 2 times daily as directed. (FOR ICD-10 E10.9, E11.9). 1 each 0   Blood Glucose Monitoring Suppl DEVI 1 each by Does not apply route in the morning, at noon, and at bedtime. May substitute to any manufacturer covered by patient's insurance. 1 each 0   botulinum toxin Type A (BOTOX) 200 units injection Inject 155 units IM into multiple sites of face head and neck every 90 days 1 each 4   Cranberry 1000 MG CAPS Take 1 capsule by mouth 2 (two) times daily.     donepezil (ARICEPT) 10 MG tablet TAKE 1 TABLET BY MOUTH EVERYDAY AT BEDTIME 90 tablet 0   dorzolamide-timolol (COSOPT) 22.3-6.8 MG/ML ophthalmic solution Place 1 drop into both eyes 2 (two) times daily.     DULoxetine (CYMBALTA) 60 MG capsule TAKE 1 CAPSULE EVERY DAY FOR ANXIETY AND DEPRESSION.  90 capsule 2   estradiol (ESTRACE) 0.1 MG/GM vaginal cream Place 1 Applicatorful vaginally 3 (three) times a week. Apply at bedtime. 42.5 g 3   fluticasone (FLONASE) 50 MCG/ACT nasal spray USE 2 SPRAYS IN EACH NOSTRIL DAILY AS NEEDED FOR ALLERGIES OR RHINITIS 48 g 2   Lancets Misc. MISC 1 each by Does not apply route in the morning, at noon, and at bedtime. May substitute to any manufacturer covered by patient's insurance. 300 each 0   latanoprost (XALATAN) 0.005 % ophthalmic solution Place 1 drop into both eyes at bedtime.     levothyroxine (SYNTHROID) 112 MCG tablet TAKE 1 TABLET EVERY MORNING MONDAY THROUGH SATURDAY; AND 1/2 TABLET ON SUNDAY. TAKE ON AN EMPTY STOMACH  WITH WATER ONLY 78 tablet 1   meclizine (ANTIVERT) 25 MG tablet Take 1 tablet (25 mg total) by mouth 2 (two) times daily as needed for dizziness. 180 tablet 0   meloxicam (MOBIC) 15 MG tablet TAKE 1 TABLET EVERY DAY AS NEEDED FOR PAIN 90 tablet 0   memantine (NAMENDA) 5 MG tablet TAKE 1 TABLET BY MOUTH TWICE A DAY 60 tablet 0   metFORMIN (GLUCOPHAGE-XR) 500 MG 24 hr tablet TAKE 1 TABLET EVERY DAY WITH BREAKFAST FOR DIABETES 90 tablet 0   mirtazapine (REMERON) 15 MG tablet TAKE 1 TABLET AT BEDTIME FOR SLEEP 90 tablet 0   Netarsudil Dimesylate (RHOPRESSA) 0.02 % SOLN Place 1 drop into both eyes daily at 6 (six) AM.     nystatin cream (MYCOSTATIN) Apply 1 Application topically 2 (two) times daily as needed. 30 g 2   omeprazole (PRILOSEC) 20 MG capsule TAKE 1 CAPSULE TWICE DAILY FOR HEARTBURN. 180 capsule 2   pilocarpine (PILOCAR) 2 % ophthalmic solution Place 1 drop into both eyes 4 (four) times daily.      propranolol (INDERAL) 80 MG tablet TAKE 1 TABLET EVERY DAY AS DIRECTED FOR HEADACHE PREVENTION. 90 tablet 2   rosuvastatin (CRESTOR) 10 MG tablet TAKE 1 TABLET EVERY DAY FOR CHOLESTEROL 90 tablet 2   SUMAtriptan (IMITREX) 25 MG tablet TAKE 1 TABLET AS DIRECTED EVERY 2 HOURS AS NEEDED FOR MIGRAINE. MAY REPEAT IN 2 HOURS IF HEADACHE PERSISTS OR RECURS. 9 tablet 11   VITAMIN D PO Take by mouth.     gabapentin (NEURONTIN) 600 MG tablet TAKE 1 TABLET THREE TIMES DAILY FOR NEUROPATHY 270 tablet 2   sulfamethoxazole-trimethoprim (BACTRIM DS) 800-160 MG tablet Take 0.5 tablets by mouth daily. 30 tablet 2   No facility-administered medications prior to visit.     Per HPI unless specifically indicated in ROS section below Review of Systems  Constitutional:  Negative for fatigue and fever.  HENT:  Negative for congestion.   Eyes:  Negative for pain.  Respiratory:  Negative for cough and shortness of breath.   Cardiovascular:  Negative for chest pain, palpitations and leg swelling.  Gastrointestinal:   Negative for abdominal pain.  Genitourinary:  Negative for dysuria and vaginal bleeding.  Musculoskeletal:  Negative for back pain.  Neurological:  Negative for syncope, light-headedness and headaches.  Psychiatric/Behavioral:  Negative for dysphoric mood.    Objective:  BP 120/70 (BP Location: Right Arm, Patient Position: Sitting, Cuff Size: Normal)   Pulse 68   Temp 98.3 F (36.8 C) (Temporal)   Ht 5\' 4"  (1.626 m)   Wt 160 lb 8 oz (72.8 kg)   SpO2 98%   BMI 27.55 kg/m   Wt Readings from Last 3 Encounters:  04/20/23 160 lb 8 oz (72.8  kg)  04/04/23 161 lb (73 kg)  01/30/23 161 lb (73 kg)      Physical Exam Constitutional:      General: She is not in acute distress.    Appearance: Normal appearance. She is well-developed. She is not ill-appearing or toxic-appearing.  HENT:     Head: Normocephalic.     Right Ear: Hearing, tympanic membrane, ear canal and external ear normal. Tympanic membrane is not erythematous, retracted or bulging.     Left Ear: Hearing, tympanic membrane, ear canal and external ear normal. Tympanic membrane is not erythematous, retracted or bulging.     Nose: No mucosal edema or rhinorrhea.     Right Sinus: No maxillary sinus tenderness or frontal sinus tenderness.     Left Sinus: No maxillary sinus tenderness or frontal sinus tenderness.     Mouth/Throat:     Pharynx: Uvula midline.  Eyes:     General: Lids are normal. Lids are everted, no foreign bodies appreciated.     Conjunctiva/sclera: Conjunctivae normal.     Pupils: Pupils are equal, round, and reactive to light.  Neck:     Thyroid: No thyroid mass or thyromegaly.     Vascular: No carotid bruit.     Trachea: Trachea normal.  Cardiovascular:     Rate and Rhythm: Normal rate and regular rhythm.     Pulses: Normal pulses.     Heart sounds: Normal heart sounds, S1 normal and S2 normal. No murmur heard.    No friction rub. No gallop.  Pulmonary:     Effort: Pulmonary effort is normal. No  tachypnea or respiratory distress.     Breath sounds: Normal breath sounds. No decreased breath sounds, wheezing, rhonchi or rales.  Abdominal:     General: Bowel sounds are normal.     Palpations: Abdomen is soft.     Tenderness: There is no abdominal tenderness.  Musculoskeletal:     Cervical back: Normal range of motion and neck supple.  Skin:    General: Skin is warm and dry.     Findings: No rash.  Neurological:     Mental Status: She is alert.  Psychiatric:        Mood and Affect: Mood is not anxious or depressed.        Speech: Speech normal.        Behavior: Behavior normal. Behavior is cooperative.        Thought Content: Thought content normal.        Judgment: Judgment normal.       Results for orders placed or performed in visit on 04/07/23  Urine culture   Collection Time: 04/07/23 11:17 AM   Specimen: Urine, Clean Catch  Result Value Ref Range   Specimen Description      URINE, CLEAN CATCH Performed at [facility], [address] [place], [place] [address]    Special Requests      NONE Performed at [facility], [address] [place], [place] [address]    Culture >=100,000 COLONIES/mL ESCHERICHIA COLI (A)    Report Status 04/09/2023 FINAL    Organism ID, Bacteria ESCHERICHIA COLI (A)       Susceptibility   Escherichia coli - MIC*    AMPICILLIN 4 SENSITIVE Sensitive     CEFEPIME <=0.12 SENSITIVE Sensitive     CEFTRIAXONE <=0.25 SENSITIVE Sensitive     CIPROFLOXACIN <=0.25 SENSITIVE Sensitive     GENTAMICIN <=1 SENSITIVE Sensitive     IMIPENEM <=0.25 SENSITIVE  Sensitive     TRIMETH/SULFA <=20 SENSITIVE Sensitive     AMPICILLIN/SULBACTAM <=2 SENSITIVE Sensitive     PIP/TAZO <=4 SENSITIVE Sensitive ug/mL    * >=100,000 COLONIES/mL ESCHERICHIA COLI  Urinalysis, Complete w Microscopic   Collection Time: 04/07/23 11:17 AM  Result Value Ref Range   Color, Urine YELLOW (A) YELLOW   APPearance HAZY (A) CLEAR   Specific Gravity, Urine 1.012  1.005 - 1.030   pH 5.0 5.0 - 8.0   Glucose, UA NEGATIVE NEGATIVE mg/dL   Hgb urine dipstick NEGATIVE NEGATIVE   Bilirubin Urine NEGATIVE NEGATIVE   Ketones, ur NEGATIVE NEGATIVE mg/dL   Protein, ur NEGATIVE NEGATIVE mg/dL   Nitrite NEGATIVE NEGATIVE   Leukocytes,Ua NEGATIVE NEGATIVE   RBC / HPF 0 0 - 5 RBC/hpf   WBC, UA 0-5 0 - 5 WBC/hpf   Bacteria, UA MANY (A) NONE SEEN   Squamous Epithelial / HPF 0-5 0 - 5 /HPF   Mucus PRESENT     Assessment and Plan  Weakness Assessment & Plan: Acute, Recent urinary tract infection symptoms have improved with completion of antibiotic course. She has not been drinking liquids as well as usual.  No overt signs in office today of dehydration, IUD blood pressure and pulse are in the normal range. Encouraged her to push water intake given she is not having nausea and vomiting. Will evaluate with labs for possible secondary cause of dizziness. Given symptoms sound more like lightheadedness, weakness and agrees to vertigo, I have asked them to stop meclizine.  This medication can cause sedation and weakness as well and may be exacerbating her symptoms  Orders: -     VITAMIN D 25 Hydroxy (Vit-D Deficiency, Fractures) -     CBC with Differential/Platelet -     Comprehensive metabolic panel -     Vitamin B12 -     TSH    No follow-ups on file.   Kerby Nora, MD

## 2023-04-20 NOTE — Patient Instructions (Signed)
Stop meclizine as it could be contributing to fatigue and weakness. We will evaluate with labs. Likely mild dehydration. Push water intake orally.

## 2023-04-21 ENCOUNTER — Encounter: Payer: Self-pay | Admitting: *Deleted

## 2023-04-21 LAB — CBC WITH DIFFERENTIAL/PLATELET
Basophils Absolute: 0.1 10*3/uL (ref 0.0–0.1)
Basophils Relative: 0.6 % (ref 0.0–3.0)
Eosinophils Absolute: 0.2 10*3/uL (ref 0.0–0.7)
Eosinophils Relative: 2.3 % (ref 0.0–5.0)
HCT: 44.3 % (ref 36.0–46.0)
Hemoglobin: 14.4 g/dL (ref 12.0–15.0)
Lymphocytes Relative: 26.8 % (ref 12.0–46.0)
Lymphs Abs: 2.7 10*3/uL (ref 0.7–4.0)
MCHC: 32.5 g/dL (ref 30.0–36.0)
MCV: 97.8 fL (ref 78.0–100.0)
Monocytes Absolute: 0.8 10*3/uL (ref 0.1–1.0)
Monocytes Relative: 7.8 % (ref 3.0–12.0)
Neutro Abs: 6.3 10*3/uL (ref 1.4–7.7)
Neutrophils Relative %: 62.5 % (ref 43.0–77.0)
Platelets: 322 10*3/uL (ref 150.0–400.0)
RBC: 4.53 Mil/uL (ref 3.87–5.11)
RDW: 14.2 % (ref 11.5–15.5)
WBC: 10.1 10*3/uL (ref 4.0–10.5)

## 2023-04-21 LAB — COMPREHENSIVE METABOLIC PANEL
ALT: 30 U/L (ref 0–35)
AST: 39 U/L — ABNORMAL HIGH (ref 0–37)
Albumin: 4.8 g/dL (ref 3.5–5.2)
Alkaline Phosphatase: 90 U/L (ref 39–117)
BUN: 23 mg/dL (ref 6–23)
CO2: 25 meq/L (ref 19–32)
Calcium: 10.2 mg/dL (ref 8.4–10.5)
Chloride: 103 meq/L (ref 96–112)
Creatinine, Ser: 1.2 mg/dL (ref 0.40–1.20)
GFR: 41.14 mL/min — ABNORMAL LOW (ref >60.00)
Glucose, Bld: 204 mg/dL — ABNORMAL HIGH (ref 70–99)
Potassium: 5.2 meq/L — ABNORMAL HIGH (ref 3.5–5.1)
Sodium: 139 meq/L (ref 135–145)
Total Bilirubin: 0.4 mg/dL (ref 0.2–1.2)
Total Protein: 7.9 g/dL (ref 6.0–8.3)

## 2023-04-21 LAB — VITAMIN B12: Vitamin B-12: 346 pg/mL (ref 211–911)

## 2023-04-21 LAB — TSH: TSH: 0.78 u[IU]/mL (ref 0.35–5.50)

## 2023-04-21 LAB — VITAMIN D 25 HYDROXY (VIT D DEFICIENCY, FRACTURES): VITD: 50.08 ng/mL (ref 30.00–100.00)

## 2023-04-22 ENCOUNTER — Encounter: Payer: Self-pay | Admitting: Pharmacy Technician

## 2023-04-22 ENCOUNTER — Emergency Department
Admission: EM | Admit: 2023-04-22 | Discharge: 2023-04-22 | Disposition: A | Discharge status: Home / Self Care | Payer: Medicare HMO | Attending: Student in an Organized Health Care Education/Training Program | Admitting: Student in an Organized Health Care Education/Training Program

## 2023-04-22 ENCOUNTER — Other Ambulatory Visit: Payer: Self-pay

## 2023-04-22 DIAGNOSIS — I10 Essential (primary) hypertension: Secondary | ICD-10-CM | POA: Diagnosis not present

## 2023-04-22 DIAGNOSIS — E119 Type 2 diabetes mellitus without complications: Secondary | ICD-10-CM | POA: Diagnosis not present

## 2023-04-22 DIAGNOSIS — R42 Dizziness and giddiness: Secondary | ICD-10-CM | POA: Insufficient documentation

## 2023-04-22 DIAGNOSIS — E860 Dehydration: Secondary | ICD-10-CM | POA: Diagnosis not present

## 2023-04-22 LAB — BASIC METABOLIC PANEL
Anion gap: 11 (ref 5–15)
BUN: 23 mg/dL (ref 8–23)
CO2: 24 mmol/L (ref 22–32)
Calcium: 9.5 mg/dL (ref 8.9–10.3)
Chloride: 99 mmol/L (ref 98–111)
Creatinine, Ser: 0.97 mg/dL (ref 0.44–1.00)
GFR, Estimated: 57 mL/min — ABNORMAL LOW (ref >60)
Glucose, Bld: 280 mg/dL — ABNORMAL HIGH (ref 70–99)
Potassium: 4 mmol/L (ref 3.5–5.1)
Sodium: 134 mmol/L — ABNORMAL LOW (ref 135–145)

## 2023-04-22 LAB — URINALYSIS, ROUTINE W REFLEX MICROSCOPIC
Bilirubin Urine: NEGATIVE
Glucose, UA: NEGATIVE mg/dL
Hgb urine dipstick: NEGATIVE
Ketones, ur: NEGATIVE mg/dL
Nitrite: NEGATIVE
Protein, ur: NEGATIVE mg/dL
Specific Gravity, Urine: 1.016 (ref 1.005–1.030)
pH: 5 (ref 5.0–8.0)

## 2023-04-22 LAB — CBC
HCT: 44.6 % (ref 36.0–46.0)
Hemoglobin: 14.2 g/dL (ref 12.0–15.0)
MCH: 31.3 pg (ref 26.0–34.0)
MCHC: 31.8 g/dL (ref 30.0–36.0)
MCV: 98.2 fL (ref 80.0–100.0)
Platelets: 271 10*3/uL (ref 150–400)
RBC: 4.54 MIL/uL (ref 3.87–5.11)
RDW: 13.1 % (ref 11.5–15.5)
WBC: 10.9 10*3/uL — ABNORMAL HIGH (ref 4.0–10.5)
nRBC: 0 % (ref 0.0–0.2)

## 2023-04-22 LAB — TROPONIN I (HIGH SENSITIVITY): Troponin I (High Sensitivity): 6 ng/L (ref <18)

## 2023-04-22 MED ORDER — SODIUM CHLORIDE 0.9 % IV BOLUS
500.0000 mL | Freq: Once | INTRAVENOUS | Status: AC
  Administered 2023-04-22: 500 mL via INTRAVENOUS

## 2023-04-22 NOTE — ED Triage Notes (Signed)
Pt here with reports of dizziness for the last several days.Spoke with [facility] center and told to come here if PO fluids have not increased to get IV fluids.

## 2023-04-22 NOTE — ED Notes (Addendum)
This RN to bedside to introduce self to pt. Pt is caox4, in no acute distress and family at bedside. Pt was made aware of MD delay. Pt was understanding. Pt just completed a round ox abx for a UTI and is feeling better but has been dizzy for several days.

## 2023-04-22 NOTE — ED Provider Notes (Signed)
[facility] Provider Note    Event Date/Time   First MD Initiated Contact with Patient 04/22/23 1512     (approximate)   History   Dizziness   HPI  Shannon Higgins is a 88 y.o. female with a history of hypertension, anemia, GERD, orthostatic hypotension, diabetes presents to the ER for evaluation of lightheadedness for the past few days.  Was recently treated for UTI with Bactrim.  The symptoms have resolved.  The symptoms were roughly 1 week ago.  Denies any flank pain.  No chest pain or shortness of breath.  No headaches.  States his symptoms improve when she lays down.     Physical Exam   Triage Vital Signs: ED Triage Vitals  Encounter Vitals Group     BP 04/22/23 1133 (!) 125/91     Systolic BP Percentile --      Diastolic BP Percentile --      Pulse Rate 04/22/23 1133 79     Resp 04/22/23 1133 16     Temp 04/22/23 1135 97.7 F (36.5 C)     Temp Source 04/22/23 1430 Oral     SpO2 04/22/23 1133 100 %     Weight --      Height --      Head Circumference --      Peak Flow --      Pain Score 04/22/23 1132 0     Pain Loc --      Pain Education --      Exclude from Growth Chart --     Most recent vital signs: Vitals:   04/22/23 1430 04/22/23 1500  BP: 130/81 (!) 146/80  Pulse: 76 79  Resp: 16   Temp: 98 F (36.7 C)   SpO2: 94% 97%     Constitutional: Alert  Eyes: Conjunctivae are normal.  Head: Atraumatic. Nose: No congestion/rhinnorhea. Mouth/Throat: Mucous membranes are moist.   Neck: Painless ROM.  Cardiovascular:   Good peripheral circulation. Respiratory: Normal respiratory effort.  No retractions.  Gastrointestinal: Soft and nontender.  Musculoskeletal:  no deformity Neurologic:  MAE spontaneously. No gross focal neurologic deficits are appreciated.  Skin:  Skin is warm, dry and intact. No rash noted. Psychiatric: Mood and affect are normal. Speech and behavior are normal.    ED Results / Procedures / Treatments    Labs (all labs ordered are listed, but only abnormal results are displayed) Labs Reviewed  BASIC METABOLIC PANEL - Abnormal; Notable for the following components:      Result Value   Sodium 134 (*)    Glucose, Bld 280 (*)    GFR, Estimated 57 (*)    All other components within normal limits  CBC - Abnormal; Notable for the following components:   WBC 10.9 (*)    All other components within normal limits  URINALYSIS, ROUTINE W REFLEX MICROSCOPIC - Abnormal; Notable for the following components:   Color, Urine YELLOW (*)    APPearance HAZY (*)    Leukocytes,Ua TRACE (*)    Bacteria, UA RARE (*)    All other components within normal limits  TROPONIN I (HIGH SENSITIVITY)     EKG  ED ECG REPORT I, Willy Eddy, the attending physician, personally viewed and interpreted this ECG.   Date: 04/22/2023  EKG Time: 17:53  Rate: 85  Rhythm: sinus  Axis: normal  Intervals: normal  ST&T Change: no stemi      PROCEDURES:  Critical Care performed: No  Procedures  MEDICATIONS ORDERED IN ED: Medications  sodium chloride 0.9 % bolus 500 mL (500 mLs Intravenous New Bag/Given 04/22/23 1529)     IMPRESSION / MDM / ASSESSMENT AND PLAN / ED COURSE  I reviewed the triage vital signs and the nursing notes.                              Differential diagnosis includes, but is not limited to, hydration, orthostasis, electrolyte abnormality, anemia, UTI, dysrhythmia, ACS  Patient presenting to the ER for evaluation of symptoms as described above.  Based on symptoms, risk factors and considered above differential, this presenting complaint could reflect a potentially life-threatening illness therefore the patient will be placed on continuous pulse oximetry and telemetry for monitoring.  Laboratory evaluation will be sent to evaluate for the above complaints.  Suspect dehydration will give IV fluids.  Will check blood work.    Clinical Course as of 04/22/23 1812  Sat Apr 22, 2023  1729 UA without evidence of infection. [PR]  1739 Patient reassessed.  She feels significantly improved after IV hydration.  Suspect component of dehydration now resolved with IV fluid bolus.  Do not see any indication for any additional diagnostic testing at this time.  She does appear appropriate for outpatient follow-up. [PR]    Clinical Course User Index [PR] Willy Eddy, MD     FINAL CLINICAL IMPRESSION(S) / ED DIAGNOSES   Final diagnoses:  Light headedness     Rx / DC Orders   ED Discharge Orders     None        Note:  This document was prepared using Dragon voice recognition software and may include unintentional dictation errors.    Willy Eddy, MD 04/22/23 [phone]

## 2023-04-22 NOTE — ED Notes (Signed)
Pt daughter assisted her to toilet and back to bed to provide urine specimen. Pt advised she feels much better with the bag of fluids.

## 2023-04-24 DIAGNOSIS — L309 Dermatitis, unspecified: Secondary | ICD-10-CM

## 2023-04-28 ENCOUNTER — Telehealth: Payer: Self-pay | Admitting: Pharmacy Technician

## 2023-04-28 ENCOUNTER — Telehealth: Payer: Self-pay

## 2023-04-28 MED ORDER — TRIAMCINOLONE ACETONIDE 0.5 % EX OINT: 1.0000 | TOPICAL_OINTMENT | Freq: Two times a day (BID) | CUTANEOUS | 0 refills | Status: AC

## 2023-04-28 NOTE — Telephone Encounter (Signed)
Pharmacy Patient Advocate Encounter  [employer] verification has been submitted. Benefit Verification #:   [id number]  Pharmacy PA has been submitted for BOTOX 100u/200u: 200u via CoverMyMeds. INSURANCE: [employer] DATE SUBMITTED: 12.27.24 KEY: [id number] Status is pending

## 2023-04-28 NOTE — Telephone Encounter (Signed)
PA has been submitted, and telephone encounter has been created. 

## 2023-04-28 NOTE — Telephone Encounter (Signed)
PA needed for botox

## 2023-05-02 ENCOUNTER — Other Ambulatory Visit: Payer: Self-pay | Admitting: Neurology

## 2023-05-02 DIAGNOSIS — G43709 Chronic migraine without aura, not intractable, without status migrainosus: Secondary | ICD-10-CM

## 2023-05-04 ENCOUNTER — Other Ambulatory Visit: Payer: Self-pay | Admitting: Neurology

## 2023-05-08 ENCOUNTER — Other Ambulatory Visit (HOSPITAL_COMMUNITY): Payer: Self-pay

## 2023-05-08 NOTE — Telephone Encounter (Signed)
 Pharmacy Patient Advocate Encounter  Received notification from [facility] that Prior Authorization for Botox 200u has been APPROVED from 05/02/22 to 05/01/24  Filled 05/03/23   PA #/Case ID/Reference #: PA Case ID #: [id number]

## 2023-05-10 ENCOUNTER — Ambulatory Visit: Payer: Medicare PPO

## 2023-05-17 ENCOUNTER — Ambulatory Visit: Payer: Self-pay

## 2023-05-19 ENCOUNTER — Telehealth: Payer: Self-pay | Admitting: Neurology

## 2023-05-19 NOTE — Telephone Encounter (Signed)
Patient daughter called and states that her botox is going to up to 800 or 900 dollars a month

## 2023-05-23 ENCOUNTER — Ambulatory Visit: Payer: Medicare HMO | Admitting: Neurology

## 2023-05-31 ENCOUNTER — Telehealth: Payer: Self-pay | Admitting: Primary Care

## 2023-05-31 NOTE — Telephone Encounter (Unsigned)
Copied from CRM [id number]. Topic: Clinical - Medication Refill >> May 31, 2023  9:55 AM Irine Seal wrote: Most Recent Primary Care Visit:  Provider: Kerby Nora E  Department: [facility]  Visit Type: OFFICE VISIT  Date: 04/20/2023  Medication: gabapentin (NEURONTIN)  Has the patient contacted their pharmacy? Yes (Agent: If no, request that the patient contact the pharmacy for the refill. If patient does not wish to contact the pharmacy document the reason why and proceed with request.) (Agent: If yes, when and what did the pharmacy advise?) patient is out of town and [facility] [place], she stated she is having neuropathy in her feet   Is this the correct pharmacy for this prescription? Yes If no, delete pharmacy and type the correct one.  This is the patient's preferred pharmacy:  [id number] [place] - [address] [place] [address] Phone: [phone] Fax: [phone]    [id number] [place], [place] - [address] [place] [address] Phone: [phone] Fax: [phone]   Has the prescription been filled recently? No  Is the patient out of the medication? Yes  Has the patient been seen for an appointment in the last year OR does the patient have an upcoming appointment? Yes  Can we respond through MyChart? Yes  Agent: Please be advised that Rx refills may take up to 3 business days. We ask that you follow-up with your pharmacy.

## 2023-05-31 NOTE — Telephone Encounter (Signed)
Please call patient:  Based on our last visit 4 months ago she said she stopped taking gabapentin altogether.  Did she stop taking gabapentin pills?  Her medication list shows a liquid form of gabapentin.  Who prescribes this for her?  Is this what she is requesting?

## 2023-05-31 NOTE — Telephone Encounter (Signed)
Refill request for Gabapentin   LOV - 04/20/23 Next OV - not scheduled Last refill - in EMR as historical

## 2023-05-31 NOTE — Telephone Encounter (Signed)
Unable to reach patient. Left voicemail to return call to our office.

## 2023-06-01 ENCOUNTER — Encounter: Payer: Medicare HMO | Admitting: Primary Care

## 2023-06-01 DIAGNOSIS — R059 Cough, unspecified: Secondary | ICD-10-CM | POA: Diagnosis not present

## 2023-06-01 DIAGNOSIS — Z20822 Contact with and (suspected) exposure to covid-19: Secondary | ICD-10-CM | POA: Diagnosis not present

## 2023-06-01 DIAGNOSIS — E119 Type 2 diabetes mellitus without complications: Secondary | ICD-10-CM | POA: Diagnosis not present

## 2023-06-01 DIAGNOSIS — J189 Pneumonia, unspecified organism: Secondary | ICD-10-CM | POA: Diagnosis not present

## 2023-06-01 DIAGNOSIS — I10 Essential (primary) hypertension: Secondary | ICD-10-CM | POA: Diagnosis not present

## 2023-06-01 DIAGNOSIS — Z87440 Personal history of urinary (tract) infections: Secondary | ICD-10-CM | POA: Diagnosis not present

## 2023-06-01 DIAGNOSIS — F0390 Unspecified dementia without behavioral disturbance: Secondary | ICD-10-CM | POA: Diagnosis not present

## 2023-06-01 DIAGNOSIS — J101 Influenza due to other identified influenza virus with other respiratory manifestations: Secondary | ICD-10-CM | POA: Diagnosis not present

## 2023-06-01 DIAGNOSIS — Z96611 Presence of right artificial shoulder joint: Secondary | ICD-10-CM | POA: Diagnosis not present

## 2023-06-01 DIAGNOSIS — M549 Dorsalgia, unspecified: Secondary | ICD-10-CM | POA: Diagnosis not present

## 2023-06-01 DIAGNOSIS — R918 Other nonspecific abnormal finding of lung field: Secondary | ICD-10-CM | POA: Diagnosis not present

## 2023-06-01 DIAGNOSIS — E860 Dehydration: Secondary | ICD-10-CM | POA: Diagnosis not present

## 2023-06-01 DIAGNOSIS — Z96612 Presence of left artificial shoulder joint: Secondary | ICD-10-CM | POA: Diagnosis not present

## 2023-06-01 DIAGNOSIS — R4182 Altered mental status, unspecified: Secondary | ICD-10-CM | POA: Diagnosis not present

## 2023-06-01 DIAGNOSIS — R079 Chest pain, unspecified: Secondary | ICD-10-CM | POA: Diagnosis not present

## 2023-06-01 NOTE — Telephone Encounter (Signed)
Copied from CRM [id number]. Topic: Clinical - Medication Question >> Jun 01, 2023  9:55 AM Ernst Spell wrote: Reason for CRM: pt called back to speak with [facility]. I called the CAL & she wasn't available. Additionally, she stated that she is having a bad cough and is requesting for Natalia Leatherwood to call in Cough Medicine for her. I explained to her that it would best to schedule an appt. However, pt responded that she has had 3 deaths in her family and will be out of town attending a funeral today at 2pm. She asked for a callback before that time if possible. Please call and advise.

## 2023-06-01 NOTE — Telephone Encounter (Signed)
Unable to reach patient. Left voicemail to return call to our office.

## 2023-06-02 ENCOUNTER — Telehealth: Payer: Self-pay

## 2023-06-02 NOTE — Transitions of Care (Post Inpatient/ED Visit) (Unsigned)
   06/02/2023  Name: Shannon Higgins MRN: [id number] DOB: 08-20-1934  Today's TOC FU Call Status: Today's TOC FU Call Status:: Unsuccessful Call (1st Attempt) Unsuccessful Call (1st Attempt) Date: 06/02/23  Attempted to reach the patient regarding the most recent Inpatient/ED visit.  Follow Up Plan: Additional outreach attempts will be made to reach the patient to complete the Transitions of Care (Post Inpatient/ED visit) call.   Signature Karena Addison, LPN [facility] Nurse Health Advisor Direct Dial [phone]

## 2023-06-02 NOTE — Telephone Encounter (Signed)
Called and spoke with patients daughter kim she stated that patient had stopped gabapentin a while back and began having foot pain more recently so she started back again. Patients daughter stated she has plenty of refills on file and does not need this sent in right now.

## 2023-06-02 NOTE — Telephone Encounter (Signed)
 Noted

## 2023-06-06 ENCOUNTER — Inpatient Hospital Stay: Payer: Medicare Other | Admitting: Primary Care

## 2023-06-06 ENCOUNTER — Inpatient Hospital Stay: Payer: Medicare Other | Admitting: Nurse Practitioner

## 2023-06-06 NOTE — Transitions of Care (Post Inpatient/ED Visit) (Signed)
   06/06/2023  Name: Shannon Higgins MRN: [id number] DOB: 1935/03/20 Paitent already seen in office Today's TOC FU Call Status: Today's TOC FU Call Status:: Unsuccessful Call (1st Attempt) Unsuccessful Call (1st Attempt) Date: 06/02/23  Attempted to reach the patient regarding the most recent Inpatient/ED visit.  Follow Up Plan: No further outreach attempts will be made at this time. We have been unable to contact the patient.  Signature Julian Lemmings, LPN [facility] Nurse Health Advisor Direct Dial [phone]

## 2023-06-09 ENCOUNTER — Ambulatory Visit (INDEPENDENT_AMBULATORY_CARE_PROVIDER_SITE_OTHER): Payer: Medicare Other | Admitting: Primary Care

## 2023-06-09 ENCOUNTER — Other Ambulatory Visit: Payer: Self-pay

## 2023-06-09 ENCOUNTER — Emergency Department
Admission: EM | Admit: 2023-06-09 | Discharge: 2023-06-09 | Disposition: A | Discharge status: Home / Self Care | Payer: Medicare Other | Attending: Emergency Medicine | Admitting: Emergency Medicine

## 2023-06-09 ENCOUNTER — Emergency Department: Payer: Medicare Other

## 2023-06-09 ENCOUNTER — Encounter: Payer: Self-pay | Admitting: Primary Care

## 2023-06-09 VITALS — BP 98/62 | HR 55 | Temp 97.2°F

## 2023-06-09 DIAGNOSIS — J189 Pneumonia, unspecified organism: Secondary | ICD-10-CM | POA: Diagnosis not present

## 2023-06-09 DIAGNOSIS — R531 Weakness: Secondary | ICD-10-CM | POA: Insufficient documentation

## 2023-06-09 DIAGNOSIS — R392 Extrarenal uremia: Secondary | ICD-10-CM | POA: Insufficient documentation

## 2023-06-09 DIAGNOSIS — E119 Type 2 diabetes mellitus without complications: Secondary | ICD-10-CM | POA: Insufficient documentation

## 2023-06-09 DIAGNOSIS — F0390 Unspecified dementia without behavioral disturbance: Secondary | ICD-10-CM | POA: Insufficient documentation

## 2023-06-09 DIAGNOSIS — I10 Essential (primary) hypertension: Secondary | ICD-10-CM | POA: Insufficient documentation

## 2023-06-09 DIAGNOSIS — R7989 Other specified abnormal findings of blood chemistry: Secondary | ICD-10-CM

## 2023-06-09 DIAGNOSIS — K449 Diaphragmatic hernia without obstruction or gangrene: Secondary | ICD-10-CM | POA: Diagnosis not present

## 2023-06-09 DIAGNOSIS — R5383 Other fatigue: Secondary | ICD-10-CM | POA: Diagnosis not present

## 2023-06-09 DIAGNOSIS — R059 Cough, unspecified: Secondary | ICD-10-CM | POA: Diagnosis not present

## 2023-06-09 LAB — CBC
HCT: 42.3 % (ref 36.0–46.0)
Hemoglobin: 13.1 g/dL (ref 12.0–15.0)
MCH: 31.1 pg (ref 26.0–34.0)
MCHC: 31 g/dL (ref 30.0–36.0)
MCV: 100.5 fL — ABNORMAL HIGH (ref 80.0–100.0)
Platelets: 312 10*3/uL (ref 150–400)
RBC: 4.21 MIL/uL (ref 3.87–5.11)
RDW: 13.3 % (ref 11.5–15.5)
WBC: 12 10*3/uL — ABNORMAL HIGH (ref 4.0–10.5)
nRBC: 0 % (ref 0.0–0.2)

## 2023-06-09 LAB — URINALYSIS, ROUTINE W REFLEX MICROSCOPIC
Bilirubin Urine: NEGATIVE
Glucose, UA: NEGATIVE mg/dL
Hgb urine dipstick: NEGATIVE
Ketones, ur: NEGATIVE mg/dL
Leukocytes,Ua: NEGATIVE
Nitrite: NEGATIVE
Protein, ur: NEGATIVE mg/dL
Specific Gravity, Urine: 1.015 (ref 1.005–1.030)
pH: 5 (ref 5.0–8.0)

## 2023-06-09 LAB — BASIC METABOLIC PANEL
Anion gap: 13 (ref 5–15)
BUN: 19 mg/dL (ref 8–23)
CO2: 23 mmol/L (ref 22–32)
Calcium: 9.2 mg/dL (ref 8.9–10.3)
Chloride: 101 mmol/L (ref 98–111)
Creatinine, Ser: 1.35 mg/dL — ABNORMAL HIGH (ref 0.44–1.00)
GFR, Estimated: 38 mL/min — ABNORMAL LOW (ref >60)
Glucose, Bld: 157 mg/dL — ABNORMAL HIGH (ref 70–99)
Potassium: 3.8 mmol/L (ref 3.5–5.1)
Sodium: 137 mmol/L (ref 135–145)

## 2023-06-09 MED ORDER — ONDANSETRON HCL 4 MG/2ML IJ SOLN
4.0000 mg | Freq: Once | INTRAMUSCULAR | Status: AC
  Administered 2023-06-09: 4 mg via INTRAVENOUS
  Filled 2023-06-09: qty 2

## 2023-06-09 MED ORDER — LACTATED RINGERS IV BOLUS
1000.0000 mL | Freq: Once | INTRAVENOUS | Status: AC
  Administered 2023-06-09: 1000 mL via INTRAVENOUS

## 2023-06-09 MED ORDER — ACETAMINOPHEN 500 MG PO TABS
1000.0000 mg | ORAL_TABLET | Freq: Once | ORAL | Status: AC
  Administered 2023-06-09: 1000 mg via ORAL
  Filled 2023-06-09: qty 2

## 2023-06-09 NOTE — ED Provider Triage Note (Signed)
 Emergency Medicine Provider Triage Evaluation Note  JAYLIN ROUNDY , a 89 y.o. female  was evaluated in triage.  Pt complains of cough and weakness. Dx w/ PNA last week on Levofloxacin . Cough has progressively worsened.   Review of Systems  Positive:  Negative:   Physical Exam  BP 119/68   Pulse 62   Temp 97.9 F (36.6 C) (Oral)   Resp 20   SpO2 95%  Gen:   Awake, no distress   Resp:  Normal effort LCTAB MSK:   Moves extremities without difficulty  Other:    Medical Decision Making  Medically screening exam initiated at 3:18 PM.  Appropriate orders placed.  CALISHA TINDEL was informed that the remainder of the evaluation will be completed by another provider, this initial triage assessment does not replace that evaluation, and the importance of remaining in the ED until their evaluation is complete.     Margrette, Koryn Charlot A, PA-C 06/09/23 1526

## 2023-06-09 NOTE — ED Notes (Signed)
 IV infusing. Daughter remains at bedside.

## 2023-06-09 NOTE — Assessment & Plan Note (Signed)
 Discovered during ED visit on 06/01/2023 in [facility] . Reviewed ED notes, labs, imaging.  Today she appears sickly and not herself, probably the worst she has presented in years.  Given lack of improvement in symptoms despite management on Tamiflu  and levofloxacin , coupled with her hypotension and generalized fatigue/weakness, agree that ED evaluation and possibly hospital admission would be the next best step.  Her daughter will take her to [facility] ED. Will call [facility] ED triage nurse for report.

## 2023-06-09 NOTE — Progress Notes (Signed)
 Subjective:    Patient ID: Shannon Higgins, female    DOB: [birth year], 89 y.o.   MRN: [id number]  HPI  Shannon Higgins is a very pleasant 89 y.o. female with a significant medical history including type 2 diabetes, hypothyroidism, high-grade ovarian cancer, dementia, hypertension, recurrent UTI, breast cancer, chronic pain who presents today to discuss  She presented to [facility] in [place] ED on 06/01/2023 with a several day history of URI symptoms.  She tested positive for influenza A.  Chest x-ray showed left airspace opacity suggesting possible pneumonia.  She was treated with Tamiflu  75 mg twice daily x 10 days, Levaquin  daily x 10 days.  She was discharged home later that day.  Since her ED visit she continues to feel poorly, slightly worse. She's weak, cannot stand without shaking, her cough has persisted despite compliance to Levaquin  and Tamiflu  prescriptions.  Her daughter believes she is significantly dehydrated as she is drinking very little. She will have sips of sweet tea and coffee throughout the day.  Her daughter is very worried about her continued symptoms and overall decline.  She and her family have lost 4 close family members over the last month.  Her daughter believes that she needs to be admitted to the hospital.  Her family is struggling to take care of her due to her overall decline and recent illness.   Review of Systems  Constitutional:  Positive for fatigue. Negative for chills and fever.  Respiratory:  Positive for cough and shortness of breath. Negative for chest tightness.   Cardiovascular:  Negative for chest pain.  Neurological:  Positive for weakness.         Past Medical History:  Diagnosis Date   Acute bronchitis 05/13/2022   Acute metabolic encephalopathy 04/23/2020   Acute tubular injury of transplanted kidney (HCC) 06/14/2016   AKI (acute kidney injury) (HCC) 06/14/2016   Asthma    Breast cancer (HCC) 2015   left   Bronchitis    CAP (community  acquired pneumonia) 04/29/2012   Chronic sinusitis    Closed fracture of medial malleolus 05/11/2018   Confusion 12/06/2016   COVID-19 virus infection 12/17/2020   Diabetes mellitus without complication (HCC)    Family history of breast cancer    GERD (gastroesophageal reflux disease)    History of ankle fracture 05/14/2018   Last Assessment & Plan:  With a recent fall. Right medial malleous. Has ortho follow up soon, splinted now and tylenol  helps her pain   History of recurrent UTIs    Hypertension    Hypokalemia 04/26/2012   Hyponatremia    Hypothyroidism    Imbalance 09/02/2019   Insomnia    Migraine    Neuropathic pain    Ovarian cancer (HCC) 2022   Ovarian mass, left 07/14/2020   Personal history of breast cancer    Personal history of chemotherapy current   bilateral ovarian ca   Pneumonia    Rheumatoid arthritis (HCC)    Sepsis (HCC) 04/26/2012   Sepsis secondary to UTI (HCC) 04/26/2012   Sleep apnea    Stroke [facility])    seen in CT scan   Thyroid  disease    Transient hypotension 06/14/2016    Social History   Socioeconomic History   Marital status: Widowed    Spouse name: Not on file   Number of children: 2   Years of education: Not on file   Highest education level: Some college, no degree  Occupational History   Occupation: retired  Tobacco Use   Smoking status: Never   Smokeless tobacco: Never  Vaping Use   Vaping status: Never Used  Substance and Sexual Activity   Alcohol use: No   Drug use: No   Sexual activity: Not Currently  Other Topics Concern   Not on file  Social History Narrative   Pt lives in 1 story home with her daughter, Luke and [name]'s husband   Has 2 adult daughters   Highest level of education: some college   Worked mainly as environmental health practitioner.   Social Drivers of Corporate Investment Banker Strain: Low Risk  (04/20/2023)   Overall Financial Resource Strain (CARDIA)    Difficulty of Paying Living Expenses: Not hard at  all  Food Insecurity: No Food Insecurity (04/20/2023)   Hunger Vital Sign    Worried About Running Out of Food in the Last Year: Never true    Ran Out of Food in the Last Year: Never true  Transportation Needs: No Transportation Needs (04/20/2023)   PRAPARE - Administrator, Civil Service (Medical): No    Lack of Transportation (Non-Medical): No  Physical Activity: Unknown (04/20/2023)   Exercise Vital Sign    Days of Exercise per Week: 0 days    Minutes of Exercise per Session: Not on file  Stress: No Stress Concern Present (04/20/2023)   [employer] of Occupational Health - Occupational Stress Questionnaire    Feeling of Stress : Not at all  Social Connections: Socially Isolated (04/20/2023)   Social Connection and Isolation Panel [NHANES]    Frequency of Communication with Friends and Family: More than three times a week    Frequency of Social Gatherings with Friends and Family: Once a week    Attends Religious Services: Never    Database Administrator or Organizations: No    Attends Engineer, Structural: Not on file    Marital Status: Widowed  Intimate Partner Violence: Not At Risk (03/01/2022)   Humiliation, Afraid, Rape, and Kick questionnaire    Fear of Current or Ex-Partner: No    Emotionally Abused: No    Physically Abused: No    Sexually Abused: No    Past Surgical History:  Procedure Laterality Date   ABDOMINAL HYSTERECTOMY     BACK SURGERY     BREAST BIOPSY Right ?`   benign   BREAST LUMPECTOMY Left 2015   breast ca   CHOLECYSTECTOMY     JOINT REPLACEMENT     LAPAROTOMY N/A 07/14/2020   Procedure: LAPAROTOMY;  Surgeon: Arloa Lamar SQUIBB, MD;  Location: [facility] ORS;  Service: Gynecology;  Laterality: N/A;   OOPHORECTOMY     OVARIAN CYST REMOVAL     PORTA CATH INSERTION N/A 07/27/2020   Procedure: PORTA CATH INSERTION;  Surgeon: Marea Selinda RAMAN, MD;  Location: [facility] INVASIVE CV LAB;  Service: Cardiovascular;  Laterality: N/A;   PORTA CATH  REMOVAL N/A 04/05/2021   Procedure: PORTA CATH REMOVAL;  Surgeon: Marea Selinda RAMAN, MD;  Location: [facility] INVASIVE CV LAB;  Service: Cardiovascular;  Laterality: N/A;   REPLACEMENT TOTAL KNEE Right    SALPINGOOPHORECTOMY Bilateral 07/14/2020   Procedure: OPEN SALPINGO OOPHORECTOMY;  Surgeon: Arloa Lamar SQUIBB, MD;  Location: [facility] ORS;  Service: Gynecology;  Laterality: Bilateral;   TOTAL SHOULDER REPLACEMENT Left     Family History  Problem Relation Age of Onset   Diabetes Daughter    Hypertension Daughter    Fibromyalgia Daughter    GER disease Daughter  Fibromyalgia Daughter    Crohn's disease Daughter    Asthma Daughter    Hypertension Mother    Breast cancer Other    Breast cancer Niece    Uterine cancer Niece     No Known Allergies  Current Outpatient Medications on File Prior to Visit  Medication Sig Dispense Refill   Accu-Chek FastClix Lancets MISC USE AS INSTRUCTED TO TEST BLOOD SUGAR DAILY 102 each 1   ACCU-CHEK GUIDE test strip 1 EACH BY IN VITRO ROUTE IN THE MORNING, AT NOON, AND AT BEDTIME. MAY SUBSTITUTE TO ANY MANUFACTURER COVERED BY PATIENT'S INSURANCE. 300 strip 0   albuterol  (VENTOLIN  HFA) 108 (90 Base) MCG/ACT inhaler Inhale 1-2 puffs into the lungs every 6 (six) hours as needed. 18 g 0   aspirin  EC 325 MG tablet Take 325 mg by mouth daily.     b complex vitamins capsule Take 1 capsule by mouth daily.     blood glucose meter kit and supplies Dispense based on patient and insurance preference. Use up to 2 times daily as directed. (FOR ICD-10 E10.9, E11.9). 1 each 0   Blood Glucose Monitoring Suppl DEVI 1 each by Does not apply route in the morning, at noon, and at bedtime. May substitute to any manufacturer covered by patient's insurance. 1 each 0   botulinum toxin Type A  (BOTOX ) 200 units injection INJECT 155 UNITS INTRAMUSCULARLY INTO MULTIPLE SITES OF FACE, HEAD, AND NECK EVERY 90 DAYS 1 each 4   Cranberry 1000 MG CAPS Take 1 capsule by mouth 2 (two) times daily.      donepezil  (ARICEPT ) 10 MG tablet TAKE 1 TABLET BY MOUTH EVERYDAY AT BEDTIME 90 tablet 0   dorzolamide -timolol  (COSOPT ) 22.3-6.8 MG/ML ophthalmic solution Place 1 drop into both eyes 2 (two) times daily.     DULoxetine  (CYMBALTA ) 60 MG capsule TAKE 1 CAPSULE EVERY DAY FOR ANXIETY AND DEPRESSION. 90 capsule 2   estradiol  (ESTRACE ) 0.1 MG/GM vaginal cream Place 1 Applicatorful vaginally 3 (three) times a week. Apply at bedtime. 42.5 g 3   fluticasone  (FLONASE ) 50 MCG/ACT nasal spray USE 2 SPRAYS IN EACH NOSTRIL DAILY AS NEEDED FOR ALLERGIES OR RHINITIS 48 g 2   gabapentin  (NEURONTIN ) 250 MG/5ML solution Take by mouth 3 (three) times daily.     Lancets Misc. MISC 1 each by Does not apply route in the morning, at noon, and at bedtime. May substitute to any manufacturer covered by patient's insurance. 300 each 0   latanoprost  (XALATAN ) 0.005 % ophthalmic solution Place 1 drop into both eyes at bedtime.     levothyroxine  (SYNTHROID ) 112 MCG tablet TAKE 1 TABLET EVERY MORNING MONDAY THROUGH SATURDAY; AND 1/2 TABLET ON SUNDAY. TAKE ON AN EMPTY STOMACH WITH WATER ONLY 78 tablet 1   meclizine  (ANTIVERT ) 25 MG tablet Take 1 tablet (25 mg total) by mouth 2 (two) times daily as needed for dizziness. 180 tablet 0   meloxicam  (MOBIC ) 15 MG tablet TAKE 1 TABLET EVERY DAY AS NEEDED FOR PAIN 90 tablet 0   memantine  (NAMENDA ) 5 MG tablet TAKE 1 TABLET BY MOUTH TWICE A DAY 60 tablet 0   metFORMIN  (GLUCOPHAGE -XR) 500 MG 24 hr tablet TAKE 1 TABLET EVERY DAY WITH BREAKFAST FOR DIABETES 90 tablet 0   mirtazapine  (REMERON ) 15 MG tablet TAKE 1 TABLET AT BEDTIME FOR SLEEP 90 tablet 0   Netarsudil Dimesylate (RHOPRESSA) 0.02 % SOLN Place 1 drop into both eyes daily at 6 (six) AM.     nystatin  cream (MYCOSTATIN ) Apply 1  Application topically 2 (two) times daily as needed. 30 g 2   omeprazole  (PRILOSEC) 20 MG capsule TAKE 1 CAPSULE TWICE DAILY FOR HEARTBURN. 180 capsule 2   pilocarpine  (PILOCAR) 2 % ophthalmic solution Place 1  drop into both eyes 4 (four) times daily.      propranolol  (INDERAL ) 80 MG tablet TAKE 1 TABLET EVERY DAY AS DIRECTED FOR HEADACHE PREVENTION. 90 tablet 2   rosuvastatin  (CRESTOR ) 10 MG tablet TAKE 1 TABLET EVERY DAY FOR CHOLESTEROL 90 tablet 2   SUMAtriptan  (IMITREX ) 25 MG tablet TAKE 1 TABLET AS DIRECTED EVERY 2 HOURS AS NEEDED FOR MIGRAINE. MAY REPEAT IN 2 HOURS IF HEADACHE PERSISTS OR RECURS. 9 tablet 11   triamcinolone  ointment (KENALOG ) 0.5 % Apply 1 Application topically 2 (two) times daily. 30 g 0   VITAMIN D  PO Take by mouth.     No current facility-administered medications on file prior to visit.    BP 98/62   Pulse (!) 55   Temp (!) 97.2 F (36.2 C) (Temporal)   SpO2 97%  Objective:   Physical Exam Constitutional:      Appearance: She is ill-appearing.  HENT:     Right Ear: Tympanic membrane normal.     Nose: No mucosal edema.     Right Sinus: No maxillary sinus tenderness or frontal sinus tenderness.     Left Sinus: No maxillary sinus tenderness or frontal sinus tenderness.  Cardiovascular:     Rate and Rhythm: Normal rate and regular rhythm.  Pulmonary:     Effort: Pulmonary effort is normal.     Breath sounds: Examination of the right-lower field reveals rhonchi. Examination of the left-lower field reveals rhonchi. Rhonchi present. No wheezing.  Musculoskeletal:     Cervical back: Neck supple.  Skin:    General: Skin is warm and dry.           Assessment & Plan:  Community acquired pneumonia of left lung, unspecified part of lung Assessment & Plan: Discovered during ED visit on 06/01/2023 in [place] . Reviewed ED notes, labs, imaging.  Today she appears sickly and not herself, probably the worst she has presented in years.  Given lack of improvement in symptoms despite management on Tamiflu  and levofloxacin , coupled with her hypotension and generalized fatigue/weakness, agree that ED evaluation and possibly hospital admission would be the next  best step.  Her daughter will take her to [facility] ED. Will call [facility] ED triage nurse for report.         Lauryl Seyer K Chelli Yerkes, NP

## 2023-06-09 NOTE — ED Provider Notes (Signed)
 [facility] Provider Note    Event Date/Time   First MD Initiated Contact with Patient 06/09/23 1912     (approximate)   History   No chief complaint on file.   HPI  Shannon Higgins is a 89 y.o. female who presents to the ED for evaluation of No chief complaint on file.   I review [place] ED visit from 1/30 and PCP visit from earlier today.  Testing positive for influenza in the ED, diagnosed with LLL pneumonia and started on Levaquin  and Tamiflu .  Otherwise history of dementia, DM, HTN and neuromuscular disorder.  Discharged from the ED into the custody of family.  Patient's daughter brings her to the ED for evaluation of generalized weakness and concerned that she may have need to be admitted to the hospital last week.  Reports that many family members have been sick recently with influenza and a lot of other psychosocial stressors.   Physical Exam   Triage Vital Signs: ED Triage Vitals  Encounter Vitals Group     BP 06/09/23 1503 119/68     Systolic BP Percentile --      Diastolic BP Percentile --      Pulse Rate 06/09/23 1503 62     Resp 06/09/23 1503 20     Temp 06/09/23 1503 97.9 F (36.6 C)     Temp Source 06/09/23 1503 Oral     SpO2 06/09/23 1503 95 %     Weight --      Height --      Head Circumference --      Peak Flow --      Pain Score 06/09/23 1504 8     Pain Loc --      Pain Education --      Exclude from Growth Chart --     Most recent vital signs: Vitals:   06/09/23 2200 06/09/23 2230  BP: 128/70 128/62  Pulse: 64 (!) 59  Resp: 15 16  Temp:    SpO2: 97% 95%    General: Awake, no distress.  Pleasantly disoriented CV:  Good peripheral perfusion.  Resp:  Normal effort.  Abd:  No distention.  Soft and benign MSK:  No deformity noted.  Neuro:  No focal deficits appreciated. Other:     ED Results / Procedures / Treatments   Labs (all labs ordered are listed, but only abnormal results are displayed) Labs  Reviewed  BASIC METABOLIC PANEL - Abnormal; Notable for the following components:      Result Value   Glucose, Bld 157 (*)    Creatinine, Ser 1.35 (*)    GFR, Estimated 38 (*)    All other components within normal limits  CBC - Abnormal; Notable for the following components:   WBC 12.0 (*)    MCV 100.5 (*)    All other components within normal limits  URINALYSIS, ROUTINE W REFLEX MICROSCOPIC - Abnormal; Notable for the following components:   Color, Urine YELLOW (*)    APPearance CLEAR (*)    All other components within normal limits  CBG MONITORING, ED    EKG Sinus rhythm with a rate of 61 bpm.  Normal axis.  No STEMI.  RADIOLOGY CXR interpreted by me without evidence of acute cardiopulmonary pathology.  Official radiology report(s): DG Chest 2 View Result Date: 06/09/2023 CLINICAL DATA:  Weakness and cough. EXAM: CHEST - 2 VIEW COMPARISON:  June 01, 2023 FINDINGS: The heart size and mediastinal contours are within normal  limits. There is marked severity calcification of the aortic arch. Both lungs are clear. There is a small hiatal hernia. Bilateral shoulder replacements are noted with radiopaque surgical clips seen within the soft tissues of the left chest wall. Multilevel degenerative changes are present throughout the thoracic spine. IMPRESSION: 1. No active cardiopulmonary disease. 2. Small hiatal hernia. Electronically Signed   By: Suzen Dials M.D.   On: 06/09/2023 19:52    PROCEDURES and INTERVENTIONS:  .1-3 Lead EKG Interpretation  Performed by: Claudene Rover, MD Authorized by: Claudene Rover, MD     Interpretation: normal     ECG rate:  65   ECG rate assessment: normal     Rhythm: sinus rhythm     Ectopy: none     Conduction: normal     Medications  lactated ringers  bolus 1,000 mL (0 mLs Intravenous Stopped 06/09/23 2255)  acetaminophen  (TYLENOL ) tablet 1,000 mg (1,000 mg Oral Given 06/09/23 2004)  ondansetron  (ZOFRAN ) injection 4 mg (4 mg Intravenous Given  06/09/23 2018)     IMPRESSION / MDM / ASSESSMENT AND PLAN / ED COURSE  I reviewed the triage vital signs and the nursing notes  Differential diagnosis includes, but is not limited to, sepsis, AKI, deconditioning, medication side effect  {Patient presents with symptoms of an acute illness or injury that is potentially life-threatening.  Patient was recently diagnosed with influenza and pneumonia, treated with Levaquin  and Tamiflu , presents with vague generalized weakness.  Signs of mild prerenal azotemia without AKI criteria.  Possibly related to recent illnesses as well as medication side effect from Tamiflu .  After IV fluids and Tylenol , she is feeling better, ambulatory at baseline and suitable for outpatient management.  Clear CXR without residual pneumonia.  Clear urine.  I considered observation admission but believe she would be very suitable for outpatient management with family.  Clinical Course as of 06/09/23 2300  Fri Jun 09, 2023  2253 Reassessed.  Patient looks sharper.  She reports feeling well.  Discussed plan of care.  Discussed getting the patient up with a walker and possible outpatient management.  Patient and daughter are agreeable. [DS]  2258 Ambulating down the hall to charge nurse and back to her room with a walker without issue.  Suitable for outpatient management. [DS]    Clinical Course User Index [DS] Claudene Rover, MD     FINAL CLINICAL IMPRESSION(S) / ED DIAGNOSES   Final diagnoses:  Prerenal azotemia  Generalized weakness     Rx / DC Orders   ED Discharge Orders     None        Note:  This document was prepared using Dragon voice recognition software and may include unintentional dictation errors.   Claudene Rover, MD 06/09/23 [phone]

## 2023-06-09 NOTE — ED Notes (Signed)
 Pt ambulated in hallway approx 90 steps with walker without incident. Provider aware

## 2023-06-09 NOTE — ED Triage Notes (Signed)
 Pt to ED via POV from [facility] clinic. Pt reports was dx with flu and PNA last Thursday and since feels increased weakness and increased cough. Pt sent over for possible admission. Pt finished tamiflu  and levofloxacin . Pt reports HA and back pain.

## 2023-06-09 NOTE — ED Notes (Signed)
 Pt to ED with daughter. Reports diagnosed with flu a last week and told she may have pneumonia. Pt is alert and oriented. Has had multiple family losses over the past couple of weeks. Pt provided with call bell and encouraged to alert staff to any needs. Daughter at bedside.

## 2023-06-09 NOTE — Discharge Instructions (Addendum)
 Use Tylenol  for pain and fevers.  Up to 1000 mg per dose, up to 4 times per day.  Do not take more than 4000 mg of Tylenol /acetaminophen  within 24 hours.Shannon Higgins of fluids and rest

## 2023-06-09 NOTE — Patient Instructions (Signed)
 Go to the hospital now.  It was a pleasure to see you today!

## 2023-06-12 ENCOUNTER — Telehealth: Payer: Self-pay

## 2023-06-12 NOTE — Transitions of Care (Post Inpatient/ED Visit) (Signed)
   06/12/2023  Name: Shannon Higgins MRN: [id number] DOB: 09/18/1934  Today's TOC FU Call Status: Today's TOC FU Call Status:: Unsuccessful Call (1st Attempt) Unsuccessful Call (1st Attempt) Date: 06/12/23  Attempted to reach the patient regarding the most recent Inpatient/ED visit.  Follow Up Plan: Additional outreach attempts will be made to reach the patient to complete the Transitions of Care (Post Inpatient/ED visit) call.   Signature Agnes Lawrence, CMA (AAMA)  [facility] [phone]

## 2023-06-13 ENCOUNTER — Ambulatory Visit: Payer: Self-pay | Admitting: Primary Care

## 2023-06-13 DIAGNOSIS — J189 Pneumonia, unspecified organism: Secondary | ICD-10-CM

## 2023-06-13 NOTE — Telephone Encounter (Signed)
  Chief Complaint: Cough Symptoms: Cough Frequency: Constant Pertinent Negatives: Patient denies SOB, fever, congestion, headache,  Disposition: [] ED /[] Urgent Care (no appt availability in office) / [x] Appointment(In office/virtual)/ []  [facility]/ [] Home Care/ [x] Refused Recommended Disposition /[]  Mobile Bus/ []  Follow-up with PCP Additional Notes: Patient contacted for complaints of a cough not respondent to OTC medications. Patient states cough began 10 days ago, and has been taking Robitussin and some cough syrup with no improvement of symptom. Cough is severe with urge to vomit frequently present after coughing spell. Patient states she has been coughing up yellow-colored sputum, but denies fever, congestion, or SOB. Patient advised by this RN to be seen within 24 hours per protocol to which patient refused, stating that she would like something prescribed, preferably something with codeine in it as this helps with her coughing. Patient advised by this RN note will be routed to office for PCP review and to call back with worsening symptoms. Patient verbalized understanding.  Reason for Disposition . SEVERE coughing spells (e.g., whooping sound after coughing, vomiting after coughing)  Answer Assessment - Initial Assessment Questions 1. ONSET: "When did the cough begin?"      10 days ago 2. SEVERITY: "How bad is the cough today?"      Severe"I get these coughing spells and sometimes I feel like I want to throw up afterwards" 3. SPUTUM: "Describe the color of your sputum" (none, dry cough; clear, white, yellow, green)    "My daughter says I've been coughing some up. It's like a yellowish color." 4. HEMOPTYSIS: "Are you coughing up any blood?" If so ask: "How much?" (flecks, streaks, tablespoons, etc.)     Denies 5. DIFFICULTY BREATHING: "Are you having difficulty breathing?" If Yes, ask: "How bad is it?" (e.g., mild, moderate, severe)    - MILD: No SOB at rest, mild  SOB with walking, speaks normally in sentences, can lie down, no retractions, pulse < 100.    - MODERATE: SOB at rest, SOB with minimal exertion and prefers to sit, cannot lie down flat, speaks in phrases, mild retractions, audible wheezing, pulse 100-120.    - SEVERE: Very SOB at rest, speaks in single words, struggling to breathe, sitting hunched forward, retractions, pulse > 120      Denies 6. FEVER: "Do you have a fever?" If Yes, ask: "What is your temperature, how was it measured, and when did it start?"     Denies 8. LUNG HISTORY: "Do you have any history of lung disease?"  (e.g., pulmonary embolus, asthma, emphysema)     Asthma 9. PE RISK FACTORS: "Do you have a history of blood clots?" (or: recent major surgery, recent prolonged travel, bedridden)     Denies 10. OTHER SYMPTOMS: "Do you have any other symptoms?" (e.g., runny nose, wheezing, chest pain)       Denies  Protocols used: Cough - Acute Non-Productive-A-AH

## 2023-06-13 NOTE — Telephone Encounter (Signed)
  Copied From CRM [phone]. Reason for Triage: Patient has a cough that's been going on for 10 days and she would like something called into the pharmacy. She has taken OTC meds and they have not helped/

## 2023-06-14 ENCOUNTER — Ambulatory Visit: Payer: Medicare HMO | Admitting: Obstetrics

## 2023-06-14 MED ORDER — HYDROCOD POLI-CHLORPHE POLI ER 10-8 MG/5ML PO SUER
5.0000 mL | Freq: Two times a day (BID) | ORAL | 0 refills | Status: DC | PRN
Start: 2023-06-14 — End: 2023-07-25

## 2023-06-14 MED ORDER — HYDROCOD POLI-CHLORPHE POLI ER 10-8 MG/5ML PO SUER
5.0000 mL | Freq: Two times a day (BID) | ORAL | 0 refills | Status: DC | PRN
Start: 2023-06-14 — End: 2023-06-14

## 2023-06-14 NOTE — Telephone Encounter (Signed)
Unable to reach patient. Left voicemail to return call to our office.

## 2023-06-14 NOTE — Telephone Encounter (Signed)
Noted.  Please call patient.  Notify her that I will send some cough medicine to her pharmacy.  Also, make sure she is taking her omeprazole pill for heartburn which can cause a cough.

## 2023-06-14 NOTE — Progress Notes (Unsigned)
New Patient Evaluation and Consultation  Referring Provider: Alinda Dooms, NP PCP: Doreene Nest, NP Date of Service: 06/15/2023  SUBJECTIVE Chief Complaint: No chief complaint on file.  History of Present Illness: Shannon Higgins is a 89 y.o. White or Caucasian female seen in consultation at the request of NP Freida Busman for evaluation of recurrent UTI vs. Vaginal pain.     UTI symptoms: vaginal pressure, pain at introitus, sensation of incomplete emptying. Uses vaginal estrogen Recent lost of 4 close family members in the past month and family is struggling to care for her at home.   Recent ED evaluations: - 06/09/23 generalized weakness with flu A at PCP evaluation. Pre-renal azotemia without AKI, discharged after IV fluids and tylenol. UA negative - 06/01/23 URI symptoms with cough, dysuria, back pain. Tested positive for Flu A, CXR suggested LLL PNA started on Levaquin and Tamiflu. Rx rocephin IM. UA + protein  - 04/22/23 lightheadedness, improved after IV hydration. UA trace leuk.  History of renal transplant*** History of sepsis secondary to UTI  ***Review of records significant for: Stage IC ovarian cancer managed by Dr. Johnnette Litter, T2DM with peripheral neuropathy, dementia,  breast cancer, decreased mobility, frequent falls, chronic pain, RA, TIA  Urinary Symptoms: {urine leakage?:24754} Leaks *** time(s) per {days/wks/mos/yrs:310907}.  Pad use: {NUMBERS 1-10:18281} {pad option:24752} per day.   Patient {ACTION; IS/IS ION:62952841} bothered by UI symptoms.  Day time voids ***.  Nocturia: *** times per night to void with insomnia Voiding dysfunction:  {empties:24755} bladder well.  Patient {DOES NOT does:27190::"does not"} use a catheter to empty bladder.  When urinating, patient feels {urine symptoms:24756} Drinks: *** per day  UTIs: {NUMBERS 1-10:18281} UTI's in the last year.   {ACTIONS;DENIES/REPORTS:21021675::"Denies"} history of {urologic  concerns:24757} Susceptibility data from last 90 days. Collected Specimen Info Organism AMPICILLIN AMPICILLIN/SULBACTAM CEFAZOLIN CEFEPIME CEFTRIAXONE Ciprofloxacin Gentamicin Susc lslt Imipenem Nitrofurantoin Susc lslt Piperacillin + Tazobactam Trimethoprim/Sulfa  04/07/23 Urine, Clean Catch Escherichia coli  S  S   S  S  S  S  S   S  S  04/04/23 Urine, Clean Catch Escherichia coli  S  S  S  S  S  S  S  S  S  S  S  01/20/23 > 100K pansensitive E. Coli. Rx bactrim  04/07/23 Rx Bactrim 04/04/23 Rx Bactrim and continued 1/2 tab daily for prolphylaxis  Pelvic Organ Prolapse Symptoms:                  Patient {denies/ admits to:24761} a feeling of a bulge the vaginal area. It has been present for {NUMBER 1-10:22536} {days/wks/mos/yrs:310907}.  Patient {denies/ admits to:24761} seeing a bulge.  This bulge {ACTION; IS/IS LKG:40102725} bothersome.  Bowel Symptom: Bowel movements: *** time(s) per {Time; day/week/month:13537} Stool consistency: {stool consistency:24758} Straining: {yes/no:19897}.  Splinting: {yes/no:19897}.  Incomplete evacuation: {yes/no:19897}.  Patient {denies/ admits to:24761} accidental bowel leakage / fecal incontinence  Occurs: *** time(s) per {Time; day/week/month:13537}  Consistency with leakage: {stool consistency:24758} Bowel regimen: {bowel regimen:24759} Last colonoscopy: Date ***, Results *** [facility] Colonoscopy   This patient has no relevant Health Maintenance data.     Sexual Function Sexually active: no.  Sexual orientation: {Sexual Orientation:[phone]} Pain with sex: {pain with sex:24762}  Pelvic Pain {denies/ admits to:24761} pelvic pain Location: *** Pain occurs: *** Prior pain treatment: *** Improved by: *** Worsened by: ***   Past Medical History:  Past Medical History:  Diagnosis Date   Acute bronchitis 05/13/2022   Acute metabolic encephalopathy 04/23/2020  Acute tubular injury of transplanted kidney (HCC) 06/14/2016   AKI (acute kidney  injury) (HCC) 06/14/2016   Asthma    Breast cancer (HCC) 2015   left   Bronchitis    CAP (community acquired pneumonia) 04/29/2012   Chronic sinusitis    Closed fracture of medial malleolus 05/11/2018   Confusion 12/06/2016   COVID-19 virus infection 12/17/2020   Diabetes mellitus without complication (HCC)    Family history of breast cancer    GERD (gastroesophageal reflux disease)    History of ankle fracture 05/14/2018   Last Assessment & Plan:  With a recent fall. Right medial malleous. Has ortho follow up soon, splinted now and tylenol helps her pain   History of recurrent UTIs    Hypertension    Hypokalemia 04/26/2012   Hyponatremia    Hypothyroidism    Imbalance 09/02/2019   Insomnia    Migraine    Neuropathic pain    Ovarian cancer (HCC) 2022   Ovarian mass, left 07/14/2020   Personal history of breast cancer    Personal history of chemotherapy current   bilateral ovarian ca   Pneumonia    Rheumatoid arthritis (HCC)    Sepsis (HCC) 04/26/2012   Sepsis secondary to UTI (HCC) 04/26/2012   Sleep apnea    Stroke [facility])    seen in CT scan   Thyroid disease    Transient hypotension 06/14/2016     Past Surgical History:   Past Surgical History:  Procedure Laterality Date   ABDOMINAL HYSTERECTOMY     BACK SURGERY     BREAST BIOPSY Right ?`   benign   BREAST LUMPECTOMY Left 2015   breast ca   CHOLECYSTECTOMY     JOINT REPLACEMENT     LAPAROTOMY N/A 07/14/2020   Procedure: LAPAROTOMY;  Surgeon: Nadara Mustard, MD;  Location: [facility] ORS;  Service: Gynecology;  Laterality: N/A;   OOPHORECTOMY     OVARIAN CYST REMOVAL     PORTA CATH INSERTION N/A 07/27/2020   Procedure: PORTA CATH INSERTION;  Surgeon: Annice Needy, MD;  Location: [facility];  Service: Cardiovascular;  Laterality: N/A;   PORTA CATH REMOVAL N/A 04/05/2021   Procedure: PORTA CATH REMOVAL;  Surgeon: Annice Needy, MD;  Location: [facility];  Service: Cardiovascular;  Laterality: N/A;    REPLACEMENT TOTAL KNEE Right    SALPINGOOPHORECTOMY Bilateral 07/14/2020   Procedure: OPEN SALPINGO OOPHORECTOMY;  Surgeon: Nadara Mustard, MD;  Location: [facility] OR[facility];  Service: Gynecology;  Laterality: Bilateral;   TOTAL SHOULDER REPLACEMENT Left      Past OB/GYN History: OB History  Gravida Para Term Preterm AB Living  2 2 2   2   SAB IAB Ectopic Multiple Live Births          # Outcome Date GA Lbr Len/2nd Weight Sex Type Anes PTL Lv  2 Term           1 Term             Vaginal deliveries: ***,  Forceps/ Vacuum deliveries: ***, Cesarean section: *** Menopausal: {menopausal:24763} Contraception: ***. Last pap smear was ***.  Any history of abnormal pap smears: {yes/no:19897}. No results found for: "DIAGPAP", "HPVHIGH", "ADEQPAP"  Medications: Patient has a current medication list which includes the following prescription(s): accu-chek fastclix lancets, accu-chek guide, albuterol, aspirin ec, b complex vitamins, blood glucose meter kit and supplies, blood glucose monitoring suppl, botox, cranberry, donepezil, dorzolamide-timolol, duloxetine, estradiol, fluticasone, gabapentin, lancets misc., latanoprost, levothyroxine, meclizine,  meloxicam, memantine, metformin, mirtazapine, rhopressa, nystatin cream, omeprazole, pilocarpine, propranolol, rosuvastatin, sumatriptan, triamcinolone ointment, and vitamin d.   Allergies: Patient has no known allergies.   Social History:  Social History   Tobacco Use   Smoking status: Never   Smokeless tobacco: Never  Vaping Use   Vaping status: Never Used  Substance Use Topics   Alcohol use: No   Drug use: No    Relationship status: widowed Patient lives with her daughter and son-in-law.   Patient is not employed, retired. Regular exercise: {Yes/No:304960894} History of abuse: {Yes/No:304960894}  Family History:   Family History  Problem Relation Age of Onset   Diabetes Daughter    Hypertension Daughter    Fibromyalgia Daughter    GER  disease Daughter    Fibromyalgia Daughter    Crohn's disease Daughter    Asthma Daughter    Hypertension Mother    Breast cancer Other    Breast cancer Niece    Uterine cancer Niece      Review of Systems: ROS   OBJECTIVE Physical Exam: There were no vitals filed for this visit.  Physical Exam   GU / Detailed Urogynecologic Evaluation:  Pelvic Exam: Normal external female genitalia; Bartholin's and Skene's glands normal in appearance; urethral meatus normal in appearance, no urethral masses or discharge.   CST: {gen negative/positive:315881}  Reflexes: bulbocavernosis {DESC; PRESENT/NOT PRESENT:21021351}, anocutaneous {DESC; PRESENT/NOT PRESENT:21021351} ***bilaterally.  Speculum exam reveals normal vaginal mucosa {With/Without:20273} atrophy. Cervix {exam; gyn cervix:30847}. Uterus {exam; pelvic uterus:30849}. Adnexa {exam; adnexa:12223}.    s/p hysterectomy: Speculum exam reveals normal vaginal mucosa {With/Without:20273}  atrophy and normal vaginal cuff.  Adnexa {exam; adnexa:12223}.    With apex supported, anterior compartment defect was {reduced:24765}  Pelvic floor strength {Roman # I-V:19040}/V, puborectalis {Roman # I-V:19040}/V external anal sphincter {Roman # I-V:19040}/V  Pelvic floor musculature: Right levator {Tender/Non-tender:20250}, Right obturator {Tender/Non-tender:20250}, Left levator {Tender/Non-tender:20250}, Left obturator {Tender/Non-tender:20250}  POP-Q:   POP-Q                                               Aa                                               Ba                                                 C                                                Gh                                               Pb  tvl                                                Ap                                               Bp                                                 D      Rectal Exam:  Normal  sphincter tone, {rectocele:24766} distal rectocele, enterocoele {DESC; PRESENT/NOT PRESENT:21021351}, no rectal masses, {sign of:24767} dyssynergia when asking the patient to bear down.  Post-Void Residual (PVR) by Bladder Scan: In order to evaluate bladder emptying, we discussed obtaining a postvoid residual and patient agreed to this procedure.  Procedure: The ultrasound unit was placed on the patient's abdomen in the suprapubic region after the patient had voided.      Laboratory Results: Lab Results  Component Value Date   COLORU yellow 01/30/2023   CLARITYU clear 01/30/2023   GLUCOSEUR Negative 01/30/2023   BILIRUBINUR NEGATIVE 06/09/2023   KETONESU pos 01/30/2023   SPECGRAV >=1.030 (A) 01/30/2023   RBCUR neg 01/30/2023   PHUR 5.0 01/30/2023   PROTEINUR NEGATIVE 06/09/2023   UROBILINOGEN 0.2 01/30/2023   LEUKOCYTESUR NEGATIVE 06/09/2023    Lab Results  Component Value Date   CREATININE 1.35 (H) 06/09/2023   CREATININE 0.97 04/22/2023   CREATININE 1.20 04/20/2023    Lab Results  Component Value Date   HGBA1C 6.2 (A) 11/29/2022    Lab Results  Component Value Date   HGB 13.1 06/09/2023     ASSESSMENT AND PLAN Ms. Ungerer is a 89 y.o. with: No diagnosis found.  There are no diagnoses linked to this encounter.   Loleta Chance, MD

## 2023-06-14 NOTE — Addendum Note (Signed)
Addended by: Doreene Nest on: 06/14/2023 01:32 PM   Modules accepted: Orders

## 2023-06-15 ENCOUNTER — Ambulatory Visit: Payer: Medicare Other | Admitting: Obstetrics

## 2023-06-15 ENCOUNTER — Encounter: Payer: Self-pay | Admitting: Obstetrics

## 2023-06-15 VITALS — BP 136/78 | HR 97 | Ht 62.8 in | Wt 164.0 lb

## 2023-06-15 DIAGNOSIS — N390 Urinary tract infection, site not specified: Secondary | ICD-10-CM | POA: Diagnosis not present

## 2023-06-15 DIAGNOSIS — R102 Pelvic and perineal pain: Secondary | ICD-10-CM | POA: Diagnosis not present

## 2023-06-15 DIAGNOSIS — R159 Full incontinence of feces: Secondary | ICD-10-CM | POA: Diagnosis not present

## 2023-06-15 DIAGNOSIS — N393 Stress incontinence (female) (male): Secondary | ICD-10-CM | POA: Diagnosis not present

## 2023-06-15 DIAGNOSIS — R2689 Other abnormalities of gait and mobility: Secondary | ICD-10-CM

## 2023-06-15 LAB — POCT URINALYSIS DIPSTICK
Blood, UA: NEGATIVE
Glucose, UA: NEGATIVE
Leukocytes, UA: NEGATIVE
Nitrite, UA: NEGATIVE
Protein, UA: POSITIVE — AB
Spec Grav, UA: 1.025 (ref 1.010–1.025)
Urobilinogen, UA: 0.2 U/dL
pH, UA: 6 (ref 5.0–8.0)

## 2023-06-15 MED ORDER — ESTRADIOL 7.5 MCG/24HR VA RING: 1.0000 | VAGINAL_RING | VAGINAL | 3 refills | Status: DC

## 2023-06-15 MED ORDER — ESTRADIOL 0.1 MG/GM VA CREA: TOPICAL_CREAM | VAGINAL | 3 refills | Status: DC

## 2023-06-15 NOTE — Assessment & Plan Note (Signed)
-   Treatment options include anti-diarrhea medication (loperamide/ Imodium OTC or prescription lomotil), fiber supplements, physical therapy, and possible sacral neuromodulation or surgery.   - encouraged titration of fiber supplementation

## 2023-06-15 NOTE — Patient Instructions (Addendum)
For treatment of recurrent urinary tract infections, we discussed management of recurrent UTIs including prophylaxis with a daily low dose antibiotic, transvaginal estrogen therapy, D-mannose, and cranberry supplements.  We discussed the role of diagnostic testing such as cystoscopy and upper tract imaging.     Resume vaginal estrogen use. Please call your pharmacy to assess cost of estring and bring it to your follow-up appointment for placement.   For vaginal atrophy (thinning of the vaginal tissue that can cause dryness and burning) and UTI prevention we discussed estrogen replacement in the form of vaginal cream.   Start vaginal estrogen therapy nightly for two weeks then 2 times weekly at night. This can be placed with your finger or an applicator inside the vagina and around the urethra.  Please let us know if the prescription is too expensive and we can look for alternative options.   Is vaginal estrogen therapy safe for me? Vaginal estrogen preparations act on the vaginal skin, and only a very tiny amount is absorbed into the bloodstream (0.01%).  They work in a similar way to hand or face cream.  There is minimal absorption and they are therefore perfectly safe. If you have had breast cancer and have persistent troublesome symptoms which aren't settling with vaginal moisturisers and lubricants, local estrogen treatment may be a possibility, but consultation with your oncologist should take place first.   The origin of pelvic floor muscle spasm can be multifactorial, including primary, reactive to a different pain source, trauma, or even part of a centralized pain syndrome.Treatment options include pelvic floor physical therapy, local (vaginal) or oral  muscle relaxants, pelvic muscle trigger point injections or centrally acting pain medications.     Please schedule an appointment with pelvic floor PT.   Accidental Bowel Leakage:  - Treatment options include anti-diarrhea medication  (loperamide/ Imodium OTC or prescription lomotil), fiber supplements, physical therapy, and possible sacral neuromodulation or surgery.     For treatment of stress urinary incontinence, which is leakage with physical activity/movement/strainging/coughing, we discussed expectant management versus nonsurgical options versus surgery. Nonsurgical options include weight loss, physical therapy, as well as a pessary.  Surgical options include a midurethral sling, which is a synthetic mesh sling that acts like a hammock under the urethra to prevent leakage of urine, a Burch urethropexy, and transurethral injection of a bulking agent.

## 2023-06-15 NOTE — Assessment & Plan Note (Signed)
-   ambulates with rollator - possible functional urinary incontinence - encouraged timed voids - consider bedside commode

## 2023-06-15 NOTE — Assessment & Plan Note (Addendum)
-   catheterized UA + protein, ketones.  - prefer catheterized urine due to fecal incontinence with risk of contamination - reviewed diagnosis requires UTI symptoms and culture. Recent hospitalization due to Flu A and low suspicion for UTI - For treatment of recurrent urinary tract infections, we discussed management of recurrent UTIs including prophylaxis with a daily low dose antibiotic, transvaginal estrogen therapy, D-mannose, and cranberry supplements.  We discussed the role of diagnostic testing such as cystoscopy and upper tract imaging.   - continue cranberry extract, start probiotics - encouraged to resume vaginal estrogen, Rx estring to assess cost to enhance consistency of use due disruption with recent loss of family members - discussed need to monitor glycemic control due to onset of increased UTI and increased HbA1C. - Cr 1.35 on 06/09/23

## 2023-06-15 NOTE — Assessment & Plan Note (Addendum)
-   bilateral myofascial pelvic pain reproducible on exam with pelvic pressure - The origin of pelvic floor muscle spasm can be multifactorial, including primary, reactive to a different pain source, trauma, or even part of a centralized pain syndrome.Treatment options include pelvic floor physical therapy, local (vaginal) or oral  muscle relaxants, pelvic muscle trigger point injections or centrally acting pain medications.   - discussed increased risk of pain due to DM neuropathy, encouraged diet modification to improve glycemic control

## 2023-06-15 NOTE — Assessment & Plan Note (Signed)
-   For treatment of stress urinary incontinence,  non-surgical options include expectant management, weight loss, physical therapy, as well as a pessary.  Surgical options include a midurethral sling, Burch urethropexy, and transurethral injection of a bulking agent. - referred to pelvic floor PT

## 2023-06-19 NOTE — Telephone Encounter (Signed)
 Unable to reach patient. Left voicemail to return call to our office.

## 2023-06-20 NOTE — Telephone Encounter (Signed)
Left detailed message for patient, per dpr, advising of Kates message. Advised to call back with any questions.

## 2023-06-21 DIAGNOSIS — G4700 Insomnia, unspecified: Secondary | ICD-10-CM

## 2023-06-22 MED ORDER — MIRTAZAPINE 15 MG PO TABS: 15.0000 mg | ORAL_TABLET | Freq: Every day | ORAL | 0 refills | Status: DC

## 2023-06-28 ENCOUNTER — Inpatient Hospital Stay: Payer: Medicare Other

## 2023-06-28 ENCOUNTER — Inpatient Hospital Stay: Payer: Medicare Other | Attending: Oncology | Admitting: Obstetrics and Gynecology

## 2023-06-28 ENCOUNTER — Encounter: Payer: Self-pay | Admitting: Obstetrics and Gynecology

## 2023-06-28 VITALS — BP 169/80

## 2023-06-28 DIAGNOSIS — Z9079 Acquired absence of other genital organ(s): Secondary | ICD-10-CM | POA: Insufficient documentation

## 2023-06-28 DIAGNOSIS — Z8543 Personal history of malignant neoplasm of ovary: Secondary | ICD-10-CM | POA: Diagnosis present

## 2023-06-28 DIAGNOSIS — Z90722 Acquired absence of ovaries, bilateral: Secondary | ICD-10-CM | POA: Insufficient documentation

## 2023-06-28 DIAGNOSIS — Z08 Encounter for follow-up examination after completed treatment for malignant neoplasm: Secondary | ICD-10-CM

## 2023-06-28 LAB — COMPREHENSIVE METABOLIC PANEL
ALT: 17 U/L (ref 0–44)
AST: 30 U/L (ref 15–41)
Albumin: 3.9 g/dL (ref 3.5–5.0)
Alkaline Phosphatase: 80 U/L (ref 38–126)
Anion gap: 11 (ref 5–15)
BUN: 18 mg/dL (ref 8–23)
CO2: 27 mmol/L (ref 22–32)
Calcium: 9.6 mg/dL (ref 8.9–10.3)
Chloride: 98 mmol/L (ref 98–111)
Creatinine, Ser: 0.82 mg/dL (ref 0.44–1.00)
GFR, Estimated: >60 mL/min (ref >60)
Glucose, Bld: 251 mg/dL — ABNORMAL HIGH (ref 70–99)
Potassium: 3.9 mmol/L (ref 3.5–5.1)
Sodium: 136 mmol/L (ref 135–145)
Total Bilirubin: 0.6 mg/dL (ref 0.0–1.2)
Total Protein: 7.7 g/dL (ref 6.5–8.1)

## 2023-06-28 LAB — CBC WITH DIFFERENTIAL/PLATELET
Abs Immature Granulocytes: 0.02 10*3/uL (ref 0.00–0.07)
Basophils Absolute: 0 10*3/uL (ref 0.0–0.1)
Basophils Relative: 0 %
Eosinophils Absolute: 0.3 10*3/uL (ref 0.0–0.5)
Eosinophils Relative: 3 %
HCT: 39.8 % (ref 36.0–46.0)
Hemoglobin: 12.6 g/dL (ref 12.0–15.0)
Immature Granulocytes: 0 %
Lymphocytes Relative: 28 %
Lymphs Abs: 2.1 10*3/uL (ref 0.7–4.0)
MCH: 31 pg (ref 26.0–34.0)
MCHC: 31.7 g/dL (ref 30.0–36.0)
MCV: 97.8 fL (ref 80.0–100.0)
Monocytes Absolute: 0.8 10*3/uL (ref 0.1–1.0)
Monocytes Relative: 11 %
Neutro Abs: 4.3 10*3/uL (ref 1.7–7.7)
Neutrophils Relative %: 58 %
Platelets: 263 10*3/uL (ref 150–400)
RBC: 4.07 MIL/uL (ref 3.87–5.11)
RDW: 13.1 % (ref 11.5–15.5)
WBC: 7.5 10*3/uL (ref 4.0–10.5)
nRBC: 0 % (ref 0.0–0.2)

## 2023-06-28 NOTE — Progress Notes (Signed)
 Gynecologic Oncology Consult Visit   Referring Provider: Dr. 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 female, who returns to clinic for continued surveillance of high grade serous ovarian cancer s/p BSO, laparotomy, partial omentectomy with some spillage on 07/14/20, followed by 4 cycles of adjuvant carbo-doxil, completed 09/2020 (last 2 cycles omitted d/t poor tolerance). She returns to clinic for continued surveillance.   CA 125 was not elevated at time of diagnosis but has been followed and has been normal, last checked in 9/24 and was 15. She had the flu about 2 weeks ago, but this has resolved.  In interim, she's had recurrent UTI and vaginal pain, despite vaginal estrogen. She has seen uro-gyn who recommended continued topical estrogen and referred her to pelvic floor PT.   Gyn Oncology History 04/24/20 admitted to [facility].  Presented to the ER with several day history of weakness, confusion, intermittent fever and thought to have UTI with sepsis.   04/23/20 - CT Abdomen Pelvis w contrast 13.5 cm x 11.8 cm x 15.2 cm simple, cystic appearing area is seen within the pelvis along the midline. This extends from the lower abdomen to the region just above the urinary bladder. Marked severity right-sided hydronephrosis and hydroureter with mild right perinephric inflammatory fat stranding and delayed right renal cortical enhancement. No obstructing renal calculi are identified. The dilated right ureter extends to the level of a large pelvic cyst   Seen by Dr Tiburcio Pea for the large pelvic mass.  CA 125  22.9 HE4   99 Postmenopausal ROMA 2.65  Underwent Bilateral salpingo-oophorectomy, laparotomy, partial omentectomy with Dr. Tiburcio Pea and Dr. Jerene Pitch on 07/14/20.  Controlled drainage of the mass done with purse string with some spillage.  No disease seen outside ovary.    07/14/20  DIAGNOSIS:  A. OVARY AND FALLOPIAN TUBE, LEFT;  SALPINGO-OOPHORECTOMY:  - HIGH-GRADE SEROUS CARCINOMA INVOLVING SEROUS CYSTADENOMA OF OVARY, SEE SUMMARY BELOW.  - FALLOPIAN TUBE NEGATIVE FOR INVASIVE AND INTRAEPITHELIAL CARCINOMA.   B. OVARY AND FALLOPIAN TUBE, RIGHT; SALPINGO-OOPHORECTOMY:  - HIGH-GRADE SEROUS CARCINOMA, ONE 1.0 CM FOCUS, INVOLVING FRAGMENTED  OVARY.  - FALLOPIAN TUBE NEGATIVE FOR INVASIVE AND INTRAEPITHELIAL CARCINOMA.   C. OMENTUM; BIOPSY:  - NEGATIVE FOR MALIGNANCY.   CANCER CASE SUMMARY: OVARY or FALLOPIAN TUBE or PRIMARY PERITONEUM. Standard(s): AJCC-UICC 8, FIGO Cancer Report 2018   SPECIMEN  Procedure: Bilateral salpingo-oophorectomy and omental biopsy  Specimen Integrity:       Left ovary integrity: Capsule ruptured (intraoperative spillage of cyst contents)       Right ovary integrity: Fragmented  TUMOR  Tumor Site: Bilateral ovaries  Tumor Size: Greatest dimension: 4 cm  Histologic Type: High-grade serous carcinoma  Histologic Grade: High-grade  Ovarian Surface Involvement:       Left: Not identified       Right: cannot be determined due to fragmented specimen  Fallopian Tube Surface Involvement: Not identified  Other Tissue/ Organ Involvement: Not applicable  Largest Extrapelvic Peritoneal Focus: Not applicable  Peritoneal/Ascitic Fluid Involvement: Results pending, see [id number]  Chemotherapy Response Score (CRS): Not applicable   REGIONAL LYMPH NODES  Regional Lymph Nodes Status: Not applicable (no regional lymph nodes submitted)   DISTANT METASTASIS  Distant Site(s) Involved, if applicable: Not applicable   ADDENDUM:  Peritoneal/Ascitic Fluid Involvement:  Not identified.  [id number]: pelvic washings negative for malignancy   FINAL PATHOLOGIC STAGE CLASSIFICATION (pTNM, AJCC 8th Edition):  pT1c1 (surgical spill),  pN not  assigned (no nodes submitted)  pM - Not applicable   CA 125  06/11/20 22.9  07/21/20 98.1 (post  surgical) 11/24/20 20.3 03/31/21 14.7 09/20/21 15.1 03/28/22 17.1 07/20/22 16.1 01/20/23 15.7  Received four cycles of carboplatin/Doxil post op.  Did not get Taxol due to pre existing neuropathy. Dr. Smith Robert stopped treatment after 4 cycles due to poor tolerance.   Genetic testing Myriad MyRisk negative. HRD testing not done.   Problem List: Patient Active Problem List   Diagnosis Date Noted   SUI (stress urinary incontinence, female) 06/15/2023   Incontinence of feces 06/15/2023   SOB (shortness of breath) 01/30/2023   Pelvic pressure in female 11/08/2022   Chronic pain of both shoulders 08/24/2022   Foul smelling urine 01/27/2022   Weakness 01/27/2022   Personal history of breast cancer    Family history of breast cancer    High grade ovarian cancer (HCC) 07/21/2020   S/P bilateral oophorectomy 07/21/2020   Decreased mobility 09/02/2019   Preventative health care 03/04/2019   Insomnia 09/27/2018   History of TIA (transient ischemic attack) 09/13/2018   Recurrent UTI 06/20/2018   Frequent falls 05/14/2018   Gout 05/14/2018   Type 2 diabetes mellitus with peripheral neuropathy (HCC) 05/11/2018   Type 2 diabetes mellitus (HCC) 05/11/2018   Orthostatic hypotension 05/08/2018   History of breast cancer 10/17/2017   MDD (major depressive disorder), recurrent episode (HCC) 06/21/2017   Chronic low back pain 01/13/2017   Hyperlipidemia 12/06/2016   Glaucoma 12/06/2016   Neuropathic pain 12/06/2016   Malignant melanoma of skin (HCC) 12/06/2016   Dementia arising in the senium and presenium (HCC) 12/06/2016   Chronic headache disorder 12/06/2016   Hypothyroidism 06/14/2016   Community acquired pneumonia 04/29/2012   Anemia 04/26/2012   Hypertensive disorder 04/26/2012   Gastroesophageal reflux disease 04/26/2012   Obstructive sleep apnea syndrome 04/26/2012    Past Medical History: Past Medical History:  Diagnosis Date   Acute bronchitis 05/13/2022   Acute metabolic  encephalopathy 04/23/2020   Acute tubular injury of transplanted kidney (HCC) 06/14/2016   AKI (acute kidney injury) (HCC) 06/14/2016   Asthma    Breast cancer (HCC) 2015   left   Bronchitis    CAP (community acquired pneumonia) 04/29/2012   Chronic sinusitis    Closed fracture of medial malleolus 05/11/2018   Confusion 12/06/2016   COVID-19 virus infection 12/17/2020   Diabetes mellitus without complication (HCC)    Family history of breast cancer    GERD (gastroesophageal reflux disease)    History of ankle fracture 05/14/2018   Last Assessment & Plan:  With a recent fall. Right medial malleous. Has ortho follow up soon, splinted now and tylenol helps her pain   History of recurrent UTIs    Hypertension    Hypokalemia 04/26/2012   Hyponatremia    Hypothyroidism    Imbalance 09/02/2019   Insomnia    Migraine    Neuropathic pain    Ovarian cancer (HCC) 2022   Ovarian mass, left 07/14/2020   Personal history of breast cancer    Personal history of chemotherapy current   bilateral ovarian ca   Pneumonia    Rheumatoid arthritis (HCC)    Sepsis (HCC) 04/26/2012   Sepsis secondary to UTI (HCC) 04/26/2012   Sleep apnea    Stroke [facility])    seen in CT scan   Thyroid disease    Transient hypotension 06/14/2016    Past Surgical History: Past Surgical History:  Procedure Laterality Date  ABDOMINAL HYSTERECTOMY     BACK SURGERY     BREAST BIOPSY Right ?`   benign   BREAST LUMPECTOMY Left 2015   breast ca   CHOLECYSTECTOMY     JOINT REPLACEMENT     LAPAROTOMY N/A 07/14/2020   Procedure: LAPAROTOMY;  Surgeon: Nadara Mustard, MD;  Location: [facility] ORS;  Service: Gynecology;  Laterality: N/A;   OOPHORECTOMY     OVARIAN CYST REMOVAL     PORTA CATH INSERTION N/A 07/27/2020   Procedure: PORTA CATH INSERTION;  Surgeon: Annice Needy, MD;  Location: [facility] INVASIVE CV LAB;  Service: Cardiovascular;  Laterality: N/A;   PORTA CATH REMOVAL N/A 04/05/2021   Procedure: PORTA CATH REMOVAL;   Surgeon: Annice Needy, MD;  Location: [facility] INVASIVE CV LAB;  Service: Cardiovascular;  Laterality: N/A;   REPLACEMENT TOTAL KNEE Right    SALPINGOOPHORECTOMY Bilateral 07/14/2020   Procedure: OPEN SALPINGO OOPHORECTOMY;  Surgeon: Nadara Mustard, MD;  Location: [facility] ORS;  Service: Gynecology;  Laterality: Bilateral;   TOTAL SHOULDER REPLACEMENT Left    Past Gynecologic History:  Post menopausal  OB History:  OB History  Gravida Para Term Preterm AB Living  2 2 2   2   SAB IAB Ectopic Multiple Live Births      2    # Outcome Date GA Lbr Len/2nd Weight Sex Type Anes PTL Lv  2 Term     F Vag-Spont   LIV  1 Term     F Vag-Spont   LIV     Complications: Breech delivery    Family History: Family History  Problem Relation Age of Onset   Hypertension Mother    Diabetes Daughter    Hypertension Daughter    Fibromyalgia Daughter    GER disease Daughter    Fibromyalgia Daughter    Crohn's disease Daughter    Asthma Daughter    Breast cancer Niece    Uterine cancer Niece    Breast cancer Other    Bladder Cancer Neg Hx     Social History: Social History   Socioeconomic History   Marital status: Widowed    Spouse name: Not on file   Number of children: 2   Years of education: Not on file   Highest education level: Some college, no degree  Occupational History   Occupation: retired   Tobacco Use   Smoking status: Never   Smokeless tobacco: Never  Vaping Use   Vaping status: Never Used  Substance and Sexual Activity   Alcohol use: No   Drug use: No   Sexual activity: Not Currently    Partners: Male    Birth control/protection: Post-menopausal  Other Topics Concern   Not on file  Social History Narrative   Pt lives in 1 story home with her daughter, [name] and [name]'s husband   Has 2 adult daughters   Highest level of education: some college   Worked mainly as Environmental health practitioner.   Social Drivers of Corporate investment banker Strain: Low Risk  (04/20/2023)    Overall Financial Resource Strain (CARDIA)    Difficulty of Paying Living Expenses: Not hard at all  Food Insecurity: No Food Insecurity (04/20/2023)   Hunger Vital Sign    Worried About Running Out of Food in the Last Year: Never true    Ran Out of Food in the Last Year: Never true  Transportation Needs: No Transportation Needs (04/20/2023)   PRAPARE - Administrator, Civil Service (Medical):  No    Lack of Transportation (Non-Medical): No  Physical Activity: Unknown (04/20/2023)   Exercise Vital Sign    Days of Exercise per Week: 0 days    Minutes of Exercise per Session: Not on file  Stress: No Stress Concern Present (04/20/2023)   [employer] of Occupational Health - Occupational Stress Questionnaire    Feeling of Stress : Not at all  Social Connections: Socially Isolated (04/20/2023)   Social Connection and Isolation Panel [NHANES]    Frequency of Communication with Friends and Family: More than three times a week    Frequency of Social Gatherings with Friends and Family: Once a week    Attends Religious Services: Never    Database administrator or Organizations: No    Attends Engineer, structural: Not on file    Marital Status: Widowed  Intimate Partner Violence: Not At Risk (03/01/2022)   Humiliation, Afraid, Rape, and Kick questionnaire    Fear of Current or Ex-Partner: No    Emotionally Abused: No    Physically Abused: No    Sexually Abused: No   Immunization History  Administered Date(s) Administered   Fluad Quad(high Dose 65+) 05/05/2020, 01/15/2021, 01/27/2022   Fluad Trivalent(High Dose 65+) 02/17/2023   Influenza,inj,Quad PF,6+ Mos 01/29/2018, 03/04/2019   PFIZER Comirnaty(Gray Top)Covid-19 Tri-Sucrose Vaccine 07/03/2020   PFIZER(Purple Top)SARS-COV-2 Vaccination 05/21/2019, 06/10/2019, 07/03/2020   Pneumococcal Polysaccharide-23 06/20/2018   Tdap 12/09/2018   Zoster Recombinant(Shingrix) 03/06/2019    Allergies: No Known  Allergies  Current Medications: Current Outpatient Medications  Medication Sig Dispense Refill   Accu-Chek FastClix Lancets MISC USE AS INSTRUCTED TO TEST BLOOD SUGAR DAILY 102 each 1   ACCU-CHEK GUIDE test strip 1 EACH BY IN VITRO ROUTE IN THE MORNING, AT NOON, AND AT BEDTIME. MAY SUBSTITUTE TO ANY MANUFACTURER COVERED BY PATIENT'S INSURANCE. 300 strip 0   albuterol (VENTOLIN HFA) 108 (90 Base) MCG/ACT inhaler Inhale 1-2 puffs into the lungs every 6 (six) hours as needed. 18 g 0   aspirin EC 325 MG tablet Take 325 mg by mouth daily.     b complex vitamins capsule Take 1 capsule by mouth daily.     blood glucose meter kit and supplies Dispense based on patient and insurance preference. Use up to 2 times daily as directed. (FOR ICD-10 E10.9, E11.9). 1 each 0   Blood Glucose Monitoring Suppl DEVI 1 each by Does not apply route in the morning, at noon, and at bedtime. May substitute to any manufacturer covered by patient's insurance. 1 each 0   botulinum toxin Type A (BOTOX) 200 units injection INJECT 155 UNITS INTRAMUSCULARLY INTO MULTIPLE SITES OF FACE, HEAD, AND NECK EVERY 90 DAYS (Patient not taking: Reported on 06/15/2023) 1 each 4   chlorpheniramine-HYDROcodone (TUSSIONEX) 10-8 MG/5ML Take 5 mLs by mouth every 12 (twelve) hours as needed. 50 mL 0   Cranberry 1000 MG CAPS Take 1 capsule by mouth 2 (two) times daily.     donepezil (ARICEPT) 10 MG tablet TAKE 1 TABLET BY MOUTH EVERYDAY AT BEDTIME 90 tablet 0   dorzolamide-timolol (COSOPT) 22.3-6.8 MG/ML ophthalmic solution Place 1 drop into both eyes 2 (two) times daily.     DULoxetine (CYMBALTA) 60 MG capsule TAKE 1 CAPSULE EVERY DAY FOR ANXIETY AND DEPRESSION. 90 capsule 2   estradiol (ESTRACE) 0.1 MG/GM vaginal cream Apply at bedtime, 1g nightly for 2 weeks, followed by 1g 3 times a week 42.5 g 3   estradiol (ESTRING) 7.5 MCG/24HR vaginal ring Place 1  each vaginally every 3 (three) months. 1 each 3   fluticasone (FLONASE) 50 MCG/ACT nasal spray  USE 2 SPRAYS IN EACH NOSTRIL DAILY AS NEEDED FOR ALLERGIES OR RHINITIS 48 g 2   gabapentin (NEURONTIN) 250 MG/5ML solution Take by mouth 3 (three) times daily.     Lancets Misc. MISC 1 each by Does not apply route in the morning, at noon, and at bedtime. May substitute to any manufacturer covered by patient's insurance. 300 each 0   latanoprost (XALATAN) 0.005 % ophthalmic solution Place 1 drop into both eyes at bedtime.     levothyroxine (SYNTHROID) 112 MCG tablet TAKE 1 TABLET EVERY MORNING MONDAY THROUGH SATURDAY; AND 1/2 TABLET ON SUNDAY. TAKE ON AN EMPTY STOMACH WITH WATER ONLY 78 tablet 1   meclizine (ANTIVERT) 25 MG tablet Take 1 tablet (25 mg total) by mouth 2 (two) times daily as needed for dizziness. 180 tablet 0   meloxicam (MOBIC) 15 MG tablet TAKE 1 TABLET EVERY DAY AS NEEDED FOR PAIN 90 tablet 0   memantine (NAMENDA) 5 MG tablet TAKE 1 TABLET BY MOUTH TWICE A DAY 60 tablet 0   metFORMIN (GLUCOPHAGE-XR) 500 MG 24 hr tablet TAKE 1 TABLET EVERY DAY WITH BREAKFAST FOR DIABETES 90 tablet 0   mirtazapine (REMERON) 15 MG tablet Take 1 tablet (15 mg total) by mouth at bedtime. For sleep 90 tablet 0   Netarsudil Dimesylate (RHOPRESSA) 0.02 % SOLN Place 1 drop into both eyes daily at 6 (six) AM.     nystatin cream (MYCOSTATIN) Apply 1 Application topically 2 (two) times daily as needed. 30 g 2   omeprazole (PRILOSEC) 20 MG capsule TAKE 1 CAPSULE TWICE DAILY FOR HEARTBURN. 180 capsule 2   pilocarpine (PILOCAR) 2 % ophthalmic solution Place 1 drop into both eyes 4 (four) times daily.      propranolol (INDERAL) 80 MG tablet TAKE 1 TABLET EVERY DAY AS DIRECTED FOR HEADACHE PREVENTION. 90 tablet 2   rosuvastatin (CRESTOR) 10 MG tablet TAKE 1 TABLET EVERY DAY FOR CHOLESTEROL 90 tablet 2   SUMAtriptan (IMITREX) 25 MG tablet TAKE 1 TABLET AS DIRECTED EVERY 2 HOURS AS NEEDED FOR MIGRAINE. MAY REPEAT IN 2 HOURS IF HEADACHE PERSISTS OR RECURS. 9 tablet 11   triamcinolone ointment (KENALOG) 0.5 % Apply 1  Application topically 2 (two) times daily. 30 g 0   VITAMIN D PO Take by mouth.     No current facility-administered medications for this visit.   Review of Systems General:  no complaints Skin: no complaints Eyes: no complaints HEENT: no complaints Breasts: no complaints Pulmonary: no complaints Cardiac: no complaints Gastrointestinal: no complaints Genitourinary/Sexual: no complaints Ob/Gyn: no complaints Musculoskeletal: no complaints Hematology: no complaints Neurologic/Psych: no complaints   Objective:  Physical Examination:  BP (!) 169/80     ECOG Performance Status: 1 - Symptomatic but completely ambulatory  GENERAL: Patient is a well appearing female in no acute distress HEENT:  Sclera clear. Anicteric NODES:  Negative axillary, supraclavicular, inguinal lymph node survery LUNGS:  Clear to auscultation bilaterally.   HEART:  Regular rate and rhythm.  ABDOMEN:  Soft, nontender.  No hernias, incisions well healed. No masses or ascites EXTREMITIES:  No peripheral edema. Atraumatic. No cyanosis SKIN:  Clear with no obvious rashes or skin changes.  NEURO:  Nonfocal. Well oriented.  Appropriate affect.  Pelvic: Chaperoned by nursing EGBUS: no lesions Vagina- no discharge, bleeding, or lesions Cervix, Uterus, Ovaries: surgically absent  Rectovaginal: no masses  Lab Review CA125 ordered for  today  Radiologic Imaging: No imaging on site today    Assessment:  ANJELINA DUNG is a 89 y.o. 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  Negative washings, omentum and abdominal survey as well as CT scan A/P. Finished four cycles of carbo/doxil in 6/22.  Last two cycles omitted due to poor tolerance.  CA125 not elevated at diagnosis. Last CA125 in 9/24 was normal. No evidence of disease on exam today. Clinically, asymptomatic.    Genetic testing with Myriad MyRisk negative  HRD testing not done.   History of  breast cancer age 4.  Medical co-morbidities complicating care: AODM with PN, HTN, stroke. Plan:   Problem List Items Addressed This Visit   None Visit Diagnoses       History of ovarian cancer    -  Primary   Relevant Orders   CA 125   CA 125       Will see her back for follow up in 8 months and she will see Dr Smith Robert in 4 months.   Consuello Masse, DNP, AGNP-C, AOCNP [facility] [phone] (clinic)  I personally interviewed and examined the patient. Agreed with the above/below plan of care. I have directly contributed to assessment and plan of care of this patient and educated and discussed with patient and family.  Leida Lauth, MD

## 2023-06-28 NOTE — H&P (Signed)
 Gynecologic Oncology History and Physical    Referring Provider: Dr. 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 female, who returns to clinic for continued surveillance of high grade serous ovarian cancer s/p BSO, laparotomy, partial omentectomy with some spillage on 07/14/20, followed by 4 cycles of adjuvant carbo-doxil, completed 09/2020 (last 2 cycles omitted d/t poor tolerance). She returns to clinic for continued surveillance.      CA 125 was not elevated at time of diagnosis but has been followed and has been normal, last checked in 9/24 and was 15. In interim, she's had recurrent UTI and vaginal pain, despite vaginal estrogen. She has seen uro-gyn who recommended continued topical estrogen and referred her to pelvic floor PT.    Gyn Oncology History 04/24/20 admitted to [facility].  Presented to the ER with several day history of weakness, confusion, intermittent fever and thought to have UTI with sepsis.    04/23/20 - CT Abdomen Pelvis w contrast 13.5 cm x 11.8 cm x 15.2 cm simple, cystic appearing area is seen within the pelvis along the midline. This extends from the lower abdomen to the region just above the urinary bladder. Marked severity right-sided hydronephrosis and hydroureter with mild right perinephric inflammatory fat stranding and delayed right renal cortical enhancement. No obstructing renal calculi are identified. The dilated right ureter extends to the level of a large pelvic cyst    Seen by Dr Tiburcio Pea for the large pelvic mass.  CA 125            22.9 HE4                 99 Postmenopausal ROMA 2.65   Underwent Bilateral salpingo-oophorectomy, laparotomy, partial omentectomy with Dr. Tiburcio Pea and Dr. Jerene Pitch on 07/14/20.  Controlled drainage of the mass done with purse string with some spillage.  No disease seen outside ovary.     07/14/20             DIAGNOSIS:  A. OVARY AND FALLOPIAN TUBE, LEFT;  SALPINGO-OOPHORECTOMY:  - HIGH-GRADE SEROUS CARCINOMA INVOLVING SEROUS CYSTADENOMA OF OVARY, SEE SUMMARY BELOW.  - FALLOPIAN TUBE NEGATIVE FOR INVASIVE AND INTRAEPITHELIAL CARCINOMA.   B. OVARY AND FALLOPIAN TUBE, RIGHT; SALPINGO-OOPHORECTOMY:  - HIGH-GRADE SEROUS CARCINOMA, ONE 1.0 CM FOCUS, INVOLVING FRAGMENTED  OVARY.  - FALLOPIAN TUBE NEGATIVE FOR INVASIVE AND INTRAEPITHELIAL CARCINOMA.   C. OMENTUM; BIOPSY:  - NEGATIVE FOR MALIGNANCY.   CANCER CASE SUMMARY: OVARY or FALLOPIAN TUBE or PRIMARY PERITONEUM. Standard(s): AJCC-UICC 8, FIGO Cancer Report 2018   SPECIMEN  Procedure: Bilateral salpingo-oophorectomy and omental biopsy  Specimen Integrity:       Left ovary integrity: Capsule ruptured (intraoperative spillage of cyst contents)       Right ovary integrity: Fragmented  TUMOR  Tumor Site: Bilateral ovaries  Tumor Size: Greatest dimension: 4 cm  Histologic Type: High-grade serous carcinoma  Histologic Grade: High-grade  Ovarian Surface Involvement:       Left: Not identified       Right: cannot be determined due to fragmented specimen  Fallopian Tube Surface Involvement: Not identified  Other Tissue/ Organ Involvement: Not applicable  Largest Extrapelvic Peritoneal Focus: Not applicable  Peritoneal/Ascitic Fluid Involvement: Results pending, see [id number]  Chemotherapy Response Score (CRS): Not applicable   REGIONAL LYMPH NODES  Regional Lymph Nodes Status: Not applicable (no regional lymph nodes submitted)   DISTANT METASTASIS  Distant Site(s) Involved, if applicable: Not applicable  ADDENDUM:  Peritoneal/Ascitic Fluid Involvement:  Not identified.  [id number]: pelvic washings negative for malignancy   FINAL PATHOLOGIC STAGE CLASSIFICATION (pTNM, AJCC 8th Edition):  pT1c1 (surgical spill),  pN not assigned (no nodes submitted)  pM - Not applicable    CA 125  06/11/20            22.9  07/21/2296.1 (post  surgical) 11/25/2218.3 03/31/2213.7 09/20/2313.1 03/28/2316.1 07/20/2414.1 01/19/2414.7   Received four cycles of carboplatin/Doxil post op.  Did not get Taxol due to pre existing neuropathy. Dr. Smith Robert stopped treatment after 4 cycles due to poor tolerance.    Genetic testing Myriad MyRisk negative. HRD testing not done.    Problem List:     Patient Active Problem List    Diagnosis Date Noted   SUI (stress urinary incontinence, female) 06/15/2023   Incontinence of feces 06/15/2023   SOB (shortness of breath) 01/30/2023   Pelvic pressure in female 11/08/2022   Chronic pain of both shoulders 08/24/2022   Foul smelling urine 01/27/2022   Weakness 01/27/2022   Personal history of breast cancer     Family history of breast cancer     High grade ovarian cancer (HCC) 07/21/2020   S/P bilateral oophorectomy 07/21/2020   Decreased mobility 09/02/2019   Preventative health care 03/04/2019   Insomnia 09/27/2018   History of TIA (transient ischemic attack) 09/13/2018   Recurrent UTI 06/20/2018   Frequent falls 05/14/2018   Gout 05/14/2018   Type 2 diabetes mellitus with peripheral neuropathy (HCC) 05/11/2018   Type 2 diabetes mellitus (HCC) 05/11/2018   Orthostatic hypotension 05/08/2018   History of breast cancer 10/17/2017   MDD (major depressive disorder), recurrent episode (HCC) 06/21/2017   Chronic low back pain 01/13/2017   Hyperlipidemia 12/06/2016   Glaucoma 12/06/2016   Neuropathic pain 12/06/2016   Malignant melanoma of skin (HCC) 12/06/2016   Dementia arising in the senium and presenium (HCC) 12/06/2016   Chronic headache disorder 12/06/2016   Hypothyroidism 06/14/2016   Community acquired pneumonia 04/29/2012   Anemia 04/26/2012   Hypertensive disorder 04/26/2012   Gastroesophageal reflux disease 04/26/2012   Obstructive sleep apnea syndrome 04/26/2012      Past Medical History:     Past Medical History:  Diagnosis Date   Acute bronchitis 05/13/2022   Acute  metabolic encephalopathy 04/23/2020   Acute tubular injury of transplanted kidney (HCC) 06/14/2016   AKI (acute kidney injury) (HCC) 06/14/2016   Asthma     Breast cancer (HCC) 2015    left   Bronchitis     CAP (community acquired pneumonia) 04/29/2012   Chronic sinusitis     Closed fracture of medial malleolus 05/11/2018   Confusion 12/06/2016   COVID-19 virus infection 12/17/2020   Diabetes mellitus without complication (HCC)     Family history of breast cancer     GERD (gastroesophageal reflux disease)     History of ankle fracture 05/14/2018    Last Assessment & Plan:  With a recent fall. Right medial malleous. Has ortho follow up soon, splinted now and tylenol helps her pain   History of recurrent UTIs     Hypertension     Hypokalemia 04/26/2012   Hyponatremia     Hypothyroidism     Imbalance 09/02/2019   Insomnia     Migraine     Neuropathic pain     Ovarian cancer (HCC) 2022   Ovarian mass, left 07/14/2020   Personal history of breast cancer     Personal history of chemotherapy current  bilateral ovarian ca   Pneumonia     Rheumatoid arthritis (HCC)     Sepsis (HCC) 04/26/2012   Sepsis secondary to UTI (HCC) 04/26/2012   Sleep apnea     Stroke [facility])      seen in CT scan   Thyroid disease     Transient hypotension 06/14/2016          Past Surgical History:      Past Surgical History:  Procedure Laterality Date   ABDOMINAL HYSTERECTOMY       BACK SURGERY       BREAST BIOPSY Right ?`    benign   BREAST LUMPECTOMY Left 2015    breast ca   CHOLECYSTECTOMY       JOINT REPLACEMENT       LAPAROTOMY N/A 07/14/2020    Procedure: LAPAROTOMY;  Surgeon: Nadara Mustard, MD;  Location: [facility] ORS;  Service: Gynecology;  Laterality: N/A;   OOPHORECTOMY       OVARIAN CYST REMOVAL       PORTA CATH INSERTION N/A 07/27/2020    Procedure: PORTA CATH INSERTION;  Surgeon: Annice Needy, MD;  Location: [facility] INVASIVE CV LAB;  Service: Cardiovascular;  Laterality: N/A;    PORTA CATH REMOVAL N/A 04/05/2021    Procedure: PORTA CATH REMOVAL;  Surgeon: Annice Needy, MD;  Location: [facility] INVASIVE CV LAB;  Service: Cardiovascular;  Laterality: N/A;   REPLACEMENT TOTAL KNEE Right     SALPINGOOPHORECTOMY Bilateral 07/14/2020    Procedure: OPEN SALPINGO OOPHORECTOMY;  Surgeon: Nadara Mustard, MD;  Location: [facility] ORS;  Service: Gynecology;  Laterality: Bilateral;   TOTAL SHOULDER REPLACEMENT Left          Past Gynecologic History:  Post menopausal   OB History:                   OB History  Gravida Para Term Preterm AB Living   2 2 2     2    SAB IAB Ectopic Multiple Live Births              2         # Outcome Date GA Lbr Len/2nd Weight Sex Type Anes PTL Lv  2 Term         F Vag-Spont     LIV  1 Term         F Vag-Spont     LIV     Complications: Breech delivery      Family History:      Family History  Problem Relation Age of Onset   Hypertension Mother     Diabetes Daughter     Hypertension Daughter     Fibromyalgia Daughter     GER disease Daughter     Fibromyalgia Daughter     Crohn's disease Daughter     Asthma Daughter     Breast cancer Niece     Uterine cancer Niece     Breast cancer Other     Bladder Cancer Neg Hx            Social History: Social History         Socioeconomic History   Marital status: Widowed      Spouse name: Not on file   Number of children: 2   Years of education: Not on file   Highest education level: Some college, no degree  Occupational History   Occupation: retired   Tobacco Use   Smoking status: Never  Smokeless tobacco: Never  Vaping Use   Vaping status: Never Used  Substance and Sexual Activity   Alcohol use: No   Drug use: No   Sexual activity: Not Currently      Partners: Male      Birth control/protection: Post-menopausal  Other Topics Concern   Not on file  Social History Narrative    Pt lives in 1 story home with her daughter, [name] and [name]'s husband    Has 2 adult daughters     Highest level of education: some college    Worked mainly as Environmental health practitioner.    Social Drivers of Acupuncturist Strain: Low Risk  (04/20/2023)    Overall Financial Resource Strain (CARDIA)     Difficulty of Paying Living Expenses: Not hard at all  Food Insecurity: No Food Insecurity (04/20/2023)    Hunger Vital Sign     Worried About Running Out of Food in the Last Year: Never true     Ran Out of Food in the Last Year: Never true  Transportation Needs: No Transportation Needs (04/20/2023)    PRAPARE - Therapist, art (Medical): No     Lack of Transportation (Non-Medical): No  Physical Activity: Unknown (04/20/2023)    Exercise Vital Sign     Days of Exercise per Week: 0 days     Minutes of Exercise per Session: Not on file  Stress: No Stress Concern Present (04/20/2023)    [employer] of Occupational Health - Occupational Stress Questionnaire     Feeling of Stress : Not at all  Social Connections: Socially Isolated (04/20/2023)    Social Connection and Isolation Panel [NHANES]     Frequency of Communication with Friends and Family: More than three times a week     Frequency of Social Gatherings with Friends and Family: Once a week     Attends Religious Services: Never     Database administrator or Organizations: No     Attends Engineer, structural: Not on file     Marital Status: Widowed  Intimate Partner Violence: Not At Risk (03/01/2022)    Humiliation, Afraid, Rape, and Kick questionnaire     Fear of Current or Ex-Partner: No     Emotionally Abused: No     Physically Abused: No     Sexually Abused: No        Immunization History  Administered Date(s) Administered   Fluad Quad(high Dose 65+) 05/05/2020, 01/15/2021, 01/27/2022   Fluad Trivalent(High Dose 65+) 02/17/2023   Influenza,inj,Quad PF,6+ Mos 01/29/2018, 03/04/2019   PFIZER Comirnaty(Gray Top)Covid-19 Tri-Sucrose Vaccine 07/03/2020    PFIZER(Purple Top)SARS-COV-2 Vaccination 05/21/2019, 06/10/2019, 07/03/2020   Pneumococcal Polysaccharide-23 06/20/2018   Tdap 12/09/2018   Zoster Recombinant(Shingrix) 03/06/2019      Allergies: Allergies  No Known Allergies     Current Medications:       Current Outpatient Medications  Medication Sig Dispense Refill   Accu-Chek FastClix Lancets MISC USE AS INSTRUCTED TO TEST BLOOD SUGAR DAILY 102 each 1   ACCU-CHEK GUIDE test strip 1 EACH BY IN VITRO ROUTE IN THE MORNING, AT NOON, AND AT BEDTIME. MAY SUBSTITUTE TO ANY MANUFACTURER COVERED BY PATIENT'S INSURANCE. 300 strip 0   albuterol (VENTOLIN HFA) 108 (90 Base) MCG/ACT inhaler Inhale 1-2 puffs into the lungs every 6 (six) hours as needed. 18 g 0   aspirin EC 325 MG tablet Take 325 mg by mouth daily.  b complex vitamins capsule Take 1 capsule by mouth daily.       blood glucose meter kit and supplies Dispense based on patient and insurance preference. Use up to 2 times daily as directed. (FOR ICD-10 E10.9, E11.9). 1 each 0   Blood Glucose Monitoring Suppl DEVI 1 each by Does not apply route in the morning, at noon, and at bedtime. May substitute to any manufacturer covered by patient's insurance. 1 each 0   botulinum toxin Type A (BOTOX) 200 units injection INJECT 155 UNITS INTRAMUSCULARLY INTO MULTIPLE SITES OF FACE, HEAD, AND NECK EVERY 90 DAYS (Patient not taking: Reported on 06/15/2023) 1 each 4   chlorpheniramine-HYDROcodone (TUSSIONEX) 10-8 MG/5ML Take 5 mLs by mouth every 12 (twelve) hours as needed. 50 mL 0   Cranberry 1000 MG CAPS Take 1 capsule by mouth 2 (two) times daily.       donepezil (ARICEPT) 10 MG tablet TAKE 1 TABLET BY MOUTH EVERYDAY AT BEDTIME 90 tablet 0   dorzolamide-timolol (COSOPT) 22.3-6.8 MG/ML ophthalmic solution Place 1 drop into both eyes 2 (two) times daily.       DULoxetine (CYMBALTA) 60 MG capsule TAKE 1 CAPSULE EVERY DAY FOR ANXIETY AND DEPRESSION. 90 capsule 2   estradiol (ESTRACE) 0.1 MG/GM  vaginal cream Apply at bedtime, 1g nightly for 2 weeks, followed by 1g 3 times a week 42.5 g 3   estradiol (ESTRING) 7.5 MCG/24HR vaginal ring Place 1 each vaginally every 3 (three) months. 1 each 3   fluticasone (FLONASE) 50 MCG/ACT nasal spray USE 2 SPRAYS IN EACH NOSTRIL DAILY AS NEEDED FOR ALLERGIES OR RHINITIS 48 g 2   gabapentin (NEURONTIN) 250 MG/5ML solution Take by mouth 3 (three) times daily.       Lancets Misc. MISC 1 each by Does not apply route in the morning, at noon, and at bedtime. May substitute to any manufacturer covered by patient's insurance. 300 each 0   latanoprost (XALATAN) 0.005 % ophthalmic solution Place 1 drop into both eyes at bedtime.       levothyroxine (SYNTHROID) 112 MCG tablet TAKE 1 TABLET EVERY MORNING MONDAY THROUGH SATURDAY; AND 1/2 TABLET ON SUNDAY. TAKE ON AN EMPTY STOMACH WITH WATER ONLY 78 tablet 1   meclizine (ANTIVERT) 25 MG tablet Take 1 tablet (25 mg total) by mouth 2 (two) times daily as needed for dizziness. 180 tablet 0   meloxicam (MOBIC) 15 MG tablet TAKE 1 TABLET EVERY DAY AS NEEDED FOR PAIN 90 tablet 0   memantine (NAMENDA) 5 MG tablet TAKE 1 TABLET BY MOUTH TWICE A DAY 60 tablet 0   metFORMIN (GLUCOPHAGE-XR) 500 MG 24 hr tablet TAKE 1 TABLET EVERY DAY WITH BREAKFAST FOR DIABETES 90 tablet 0   mirtazapine (REMERON) 15 MG tablet Take 1 tablet (15 mg total) by mouth at bedtime. For sleep 90 tablet 0   Netarsudil Dimesylate (RHOPRESSA) 0.02 % SOLN Place 1 drop into both eyes daily at 6 (six) AM.       nystatin cream (MYCOSTATIN) Apply 1 Application topically 2 (two) times daily as needed. 30 g 2   omeprazole (PRILOSEC) 20 MG capsule TAKE 1 CAPSULE TWICE DAILY FOR HEARTBURN. 180 capsule 2   pilocarpine (PILOCAR) 2 % ophthalmic solution Place 1 drop into both eyes 4 (four) times daily.        propranolol (INDERAL) 80 MG tablet TAKE 1 TABLET EVERY DAY AS DIRECTED FOR HEADACHE PREVENTION. 90 tablet 2   rosuvastatin (CRESTOR) 10 MG tablet TAKE 1 TABLET  EVERY  DAY FOR CHOLESTEROL 90 tablet 2   SUMAtriptan (IMITREX) 25 MG tablet TAKE 1 TABLET AS DIRECTED EVERY 2 HOURS AS NEEDED FOR MIGRAINE. MAY REPEAT IN 2 HOURS IF HEADACHE PERSISTS OR RECURS. 9 tablet 11   triamcinolone ointment (KENALOG) 0.5 % Apply 1 Application topically 2 (two) times daily. 30 g 0   VITAMIN D PO Take by mouth.          No current facility-administered medications for this visit.      Review of Systems General:  no complaints Skin: no complaints Eyes: no complaints HEENT: no complaints Breasts: no complaints Pulmonary: no complaints Cardiac: no complaints Gastrointestinal: no complaints Genitourinary/Sexual: no complaints Ob/Gyn: no complaints Musculoskeletal: no complaints Hematology: no complaints Neurologic/Psych: no complaints     Objective:  Physical Examination:  There were no vitals taken for this visit.    ECOG Performance Status: 1 - Symptomatic but completely ambulatory   GENERAL: Patient is a well appearing female in no acute distress HEENT:  Sclera clear. Anicteric NODES:  Negative axillary, supraclavicular, inguinal lymph node survery LUNGS:  Clear to auscultation bilaterally.   HEART:  Regular rate and rhythm.  ABDOMEN:  Soft, nontender.  No hernias, incisions well healed. No masses or ascites EXTREMITIES:  No peripheral edema. Atraumatic. No cyanosis SKIN:  Clear with no obvious rashes or skin changes.  NEURO:  Nonfocal. Well oriented.  Appropriate affect.   Pelvic: Chaperoned by nursing EGBUS: no lesions Vagina- no discharge, bleeding, or lesions Cervix, Uterus, Ovaries: surgically absent  Rectovaginal: no masses   Lab Review CA125 ordered for today   Radiologic Imaging: No imaging on site today    Assessment:  Shannon Higgins is a 89 y.o. 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  Negative washings, omentum and abdominal survey as well as CT scan A/P. Finished  four cycles of carbo/doxil in 6/22.  Last two cycles omitted due to poor tolerance.   CA125 not elevated at diagnosis. Last CA125 3/24 was normal. No evidence of disease on exam today. Clinically, asymptomatic.     Genetic testing with Myriad MyRisk negative  HRD testing not done.   History of breast cancer age 69.   Medical co-morbidities complicating care: AODM with PN, HTN, stroke. Plan:    Problem List Items Addressed This Visit   None     Discussed potential for PARP inhibitor therapy at some point in the future if she has a recurrence and somatic tumor tissue testing for HRD would be useful at that point.   Will see her back for follow up in 8 months and she will see Dr Smith Robert in 4 months.    Consuello Masse, DNP, AGNP-C, AOCNP [facility] [phone] (clinic)   I personally interviewed and examined the patient. Agreed with the above/below plan of care. I have directly contributed to assessment and plan of care of this patient and educated and discussed with patient and family.  Leida Lauth, MD

## 2023-06-29 LAB — CA 125: Cancer Antigen (CA) 125: 21.7 U/mL (ref 0.0–38.1)

## 2023-07-07 DIAGNOSIS — K219 Gastro-esophageal reflux disease without esophagitis: Secondary | ICD-10-CM

## 2023-07-07 DIAGNOSIS — E785 Hyperlipidemia, unspecified: Secondary | ICD-10-CM

## 2023-07-08 MED ORDER — ROSUVASTATIN CALCIUM 10 MG PO TABS: 10.0000 mg | ORAL_TABLET | Freq: Every day | ORAL | 0 refills | Status: DC

## 2023-07-08 MED ORDER — OMEPRAZOLE 20 MG PO CPDR
DELAYED_RELEASE_CAPSULE | ORAL | 0 refills | Status: DC
Start: 2023-07-08 — End: 2023-09-18

## 2023-07-12 ENCOUNTER — Telehealth: Payer: Self-pay

## 2023-07-12 ENCOUNTER — Other Ambulatory Visit: Payer: Self-pay | Admitting: Neurology

## 2023-07-12 NOTE — Telephone Encounter (Signed)
 CA125 slightly higher than previous. Recent history of flu. Dr. Johnnette Litter would like to have level repeated in 1-2 months. Voicemail was left to call cancer center and arrange appointment.

## 2023-07-20 ENCOUNTER — Ambulatory Visit

## 2023-07-20 VITALS — Ht 62.8 in | Wt 164.0 lb

## 2023-07-20 DIAGNOSIS — Z0000 Encounter for general adult medical examination without abnormal findings: Secondary | ICD-10-CM | POA: Diagnosis not present

## 2023-07-20 NOTE — Progress Notes (Signed)
 Subjective:   Shannon Higgins is a 89 y.o. who presents for a Medicare Wellness preventive visit.  Visit Complete: Virtual I connected with  Shannon Higgins on 07/20/23 by a audio enabled telemedicine application and verified that I am speaking with the correct person using two identifiers.  Patient Location: Home  Provider Location: Home Office  I discussed the limitations of evaluation and management by telemedicine. The patient expressed understanding and agreed to proceed.  Vital Signs: Because this visit was a virtual/telehealth visit, some criteria may be missing or patient reported. Any vitals not documented were not able to be obtained and vitals that have been documented are patient reported.  VideoDeclined- This patient declined Librarian, academic. Therefore the visit was completed with audio only.  Persons Participating in Visit: Patient. And daughter [name] Questionnaire: No: Patient Medicare AWV questionnaire was not completed prior to this visit.  Cardiac Risk Factors include: advanced age (>47men, >55 women);diabetes mellitus;dyslipidemia;hypertension;sedentary lifestyle     Objective:    Today's Vitals   07/20/23 1132  Weight: 164 lb (74.4 kg)  Height: 5' 2.8" (1.595 m)   Body mass index is 29.24 kg/m.     07/20/2023   11:42 AM 06/28/2023    2:03 PM 06/09/2023    3:04 PM 04/22/2023   11:33 AM 04/04/2023    2:09 PM 01/20/2023    2:28 PM 01/18/2023    2:26 PM  Advanced Directives  Does Patient Have a Medical Advance Directive? No No No Yes No No No  Type of Theme park manager;Living will     Copy of Healthcare Power of Attorney in Chart?    No - copy requested     Would patient like information on creating a medical advance directive?  No - Patient declined No - Patient declined   No - Patient declined     Current Medications (verified) Outpatient Encounter Medications as of 07/20/2023  Medication  Sig   Accu-Chek FastClix Lancets MISC USE AS INSTRUCTED TO TEST BLOOD SUGAR DAILY   ACCU-CHEK GUIDE test strip 1 EACH BY IN VITRO ROUTE IN THE MORNING, AT NOON, AND AT BEDTIME. MAY SUBSTITUTE TO ANY MANUFACTURER COVERED BY PATIENT'S INSURANCE.   albuterol (VENTOLIN HFA) 108 (90 Base) MCG/ACT inhaler Inhale 1-2 puffs into the lungs every 6 (six) hours as needed.   aspirin EC 325 MG tablet Take 325 mg by mouth daily.   b complex vitamins capsule Take 1 capsule by mouth daily.   blood glucose meter kit and supplies Dispense based on patient and insurance preference. Use up to 2 times daily as directed. (FOR ICD-10 E10.9, E11.9).   Blood Glucose Monitoring Suppl DEVI 1 each by Does not apply route in the morning, at noon, and at bedtime. May substitute to any manufacturer covered by patient's insurance.   botulinum toxin Type A (BOTOX) 200 units injection INJECT 155 UNITS INTRAMUSCULARLY INTO MULTIPLE SITES OF FACE, HEAD, AND NECK EVERY 90 DAYS   Cranberry 1000 MG CAPS Take 1 capsule by mouth 2 (two) times daily.   donepezil (ARICEPT) 10 MG tablet TAKE 1 TABLET BY MOUTH EVERYDAY AT BEDTIME   dorzolamide-timolol (COSOPT) 22.3-6.8 MG/ML ophthalmic solution Place 1 drop into both eyes 2 (two) times daily.   DULoxetine (CYMBALTA) 60 MG capsule TAKE 1 CAPSULE EVERY DAY FOR ANXIETY AND DEPRESSION.   estradiol (ESTRACE) 0.1 MG/GM vaginal cream Apply at bedtime, 1g nightly for 2 weeks, followed by 1g  3 times a week   estradiol (ESTRING) 7.5 MCG/24HR vaginal ring Place 1 each vaginally every 3 (three) months.   fluticasone (FLONASE) 50 MCG/ACT nasal spray USE 2 SPRAYS IN EACH NOSTRIL DAILY AS NEEDED FOR ALLERGIES OR RHINITIS   gabapentin (NEURONTIN) 250 MG/5ML solution Take by mouth 3 (three) times daily.   Lancets Misc. MISC 1 each by Does not apply route in the morning, at noon, and at bedtime. May substitute to any manufacturer covered by patient's insurance.   latanoprost (XALATAN) 0.005 % ophthalmic  solution Place 1 drop into both eyes at bedtime.   levothyroxine (SYNTHROID) 112 MCG tablet TAKE 1 TABLET EVERY MORNING MONDAY THROUGH SATURDAY; AND 1/2 TABLET ON SUNDAY. TAKE ON AN EMPTY STOMACH WITH WATER ONLY   meclizine (ANTIVERT) 25 MG tablet Take 1 tablet (25 mg total) by mouth 2 (two) times daily as needed for dizziness.   meloxicam (MOBIC) 15 MG tablet TAKE 1 TABLET EVERY DAY AS NEEDED FOR PAIN   memantine (NAMENDA) 5 MG tablet TAKE 1 TABLET BY MOUTH TWICE A DAY   metFORMIN (GLUCOPHAGE-XR) 500 MG 24 hr tablet TAKE 1 TABLET EVERY DAY WITH BREAKFAST FOR DIABETES   mirtazapine (REMERON) 15 MG tablet Take 1 tablet (15 mg total) by mouth at bedtime. For sleep   Netarsudil Dimesylate (RHOPRESSA) 0.02 % SOLN Place 1 drop into both eyes daily at 6 (six) AM.   nystatin cream (MYCOSTATIN) Apply 1 Application topically 2 (two) times daily as needed.   omeprazole (PRILOSEC) 20 MG capsule TAKE 1 CAPSULE TWICE DAILY FOR HEARTBURN.   pilocarpine (PILOCAR) 2 % ophthalmic solution Place 1 drop into both eyes 4 (four) times daily.    propranolol (INDERAL) 80 MG tablet TAKE 1 TABLET EVERY DAY AS DIRECTED FOR HEADACHE PREVENTION.   rosuvastatin (CRESTOR) 10 MG tablet Take 1 tablet (10 mg total) by mouth daily.   SUMAtriptan (IMITREX) 25 MG tablet TAKE 1 TABLET AS DIRECTED EVERY 2 HOURS AS NEEDED FOR MIGRAINE. MAY REPEAT IN 2 HOURS IF HEADACHE PERSISTS OR RECURS.   triamcinolone ointment (KENALOG) 0.5 % Apply 1 Application topically 2 (two) times daily.   VITAMIN D PO Take by mouth.   chlorpheniramine-HYDROcodone (TUSSIONEX) 10-8 MG/5ML Take 5 mLs by mouth every 12 (twelve) hours as needed. (Patient not taking: Reported on 07/20/2023)   No facility-administered encounter medications on file as of 07/20/2023.   Allergies (verified) Patient has no known allergies.   History: Past Medical History:  Diagnosis Date   Acute bronchitis 05/13/2022   Acute metabolic encephalopathy 04/23/2020   Acute tubular  injury of transplanted kidney (HCC) 06/14/2016   AKI (acute kidney injury) (HCC) 06/14/2016   Asthma    Breast cancer (HCC) 2015   left   Bronchitis    CAP (community acquired pneumonia) 04/29/2012   Chronic sinusitis    Closed fracture of medial malleolus 05/11/2018   Confusion 12/06/2016   COVID-19 virus infection 12/17/2020   Diabetes mellitus without complication (HCC)    Family history of breast cancer    GERD (gastroesophageal reflux disease)    History of ankle fracture 05/14/2018   Last Assessment & Plan:  With a recent fall. Right medial malleous. Has ortho follow up soon, splinted now and tylenol helps her pain   History of recurrent UTIs    Hypertension    Hypokalemia 04/26/2012   Hyponatremia    Hypothyroidism    Imbalance 09/02/2019   Insomnia    Migraine    Neuropathic pain    Ovarian  cancer (HCC) 2022   Ovarian mass, left 07/14/2020   Personal history of breast cancer    Personal history of chemotherapy current   bilateral ovarian ca   Pneumonia    Rheumatoid arthritis (HCC)    Sepsis (HCC) 04/26/2012   Sepsis secondary to UTI (HCC) 04/26/2012   Sleep apnea    Stroke [facility])    seen in CT scan   Thyroid disease    Transient hypotension 06/14/2016   Past Surgical History:  Procedure Laterality Date   ABDOMINAL HYSTERECTOMY     BACK SURGERY     BREAST BIOPSY Right ?`   benign   BREAST LUMPECTOMY Left 2015   breast ca   CHOLECYSTECTOMY     JOINT REPLACEMENT     LAPAROTOMY N/A 07/14/2020   Procedure: LAPAROTOMY;  Surgeon: Nadara Mustard, MD;  Location: [facility] ORS;  Service: Gynecology;  Laterality: N/A;   OOPHORECTOMY     OVARIAN CYST REMOVAL     PORTA CATH INSERTION N/A 07/27/2020   Procedure: PORTA CATH INSERTION;  Surgeon: Annice Needy, MD;  Location: [facility] INVASIVE CV LAB;  Service: Cardiovascular;  Laterality: N/A;   PORTA CATH REMOVAL N/A 04/05/2021   Procedure: PORTA CATH REMOVAL;  Surgeon: Annice Needy, MD;  Location: [facility] INVASIVE CV LAB;   Service: Cardiovascular;  Laterality: N/A;   REPLACEMENT TOTAL KNEE Right    SALPINGOOPHORECTOMY Bilateral 07/14/2020   Procedure: OPEN SALPINGO OOPHORECTOMY;  Surgeon: Nadara Mustard, MD;  Location: [facility] ORS;  Service: Gynecology;  Laterality: Bilateral;   TOTAL SHOULDER REPLACEMENT Left    Family History  Problem Relation Age of Onset   Hypertension Mother    Diabetes Daughter    Hypertension Daughter    Fibromyalgia Daughter    GER disease Daughter    Fibromyalgia Daughter    Crohn's disease Daughter    Asthma Daughter    Breast cancer Niece    Uterine cancer Niece    Breast cancer Other    Bladder Cancer Neg Hx    Social History   Socioeconomic History   Marital status: Widowed    Spouse name: Not on file   Number of children: 2   Years of education: Not on file   Highest education level: Some college, no degree  Occupational History   Occupation: retired   Tobacco Use   Smoking status: Never   Smokeless tobacco: Never  Vaping Use   Vaping status: Never Used  Substance and Sexual Activity   Alcohol use: No   Drug use: No   Sexual activity: Not Currently    Partners: Male    Birth control/protection: Post-menopausal  Other Topics Concern   Not on file  Social History Narrative   Pt lives in 1 story home with her daughter, [name] and [name]'s husband   Has 2 adult daughters   Highest level of education: some college   Worked mainly as Environmental health practitioner.   Social Drivers of Corporate investment banker Strain: Low Risk  (07/20/2023)   Overall Financial Resource Strain (CARDIA)    Difficulty of Paying Living Expenses: Not hard at all  Food Insecurity: No Food Insecurity (07/20/2023)   Hunger Vital Sign    Worried About Running Out of Food in the Last Year: Never true    Ran Out of Food in the Last Year: Never true  Transportation Needs: No Transportation Needs (07/20/2023)   PRAPARE - Transportation    Lack of Transportation (Medical): No    Lack  of  Transportation (Non-Medical): No  Physical Activity: Inactive (07/20/2023)   Exercise Vital Sign    Days of Exercise per Week: 0 days    Minutes of Exercise per Session: 0 min  Stress: No Stress Concern Present (07/20/2023)   [employer] of Occupational Health - Occupational Stress Questionnaire    Feeling of Stress : Not at all  Social Connections: Moderately Isolated (07/20/2023)   Social Connection and Isolation Panel [NHANES]    Frequency of Communication with Friends and Family: Twice a week    Frequency of Social Gatherings with Friends and Family: Three times a week    Attends Religious Services: 1 to 4 times per year    Active Member of Clubs or Organizations: No    Attends Banker Meetings: Never    Marital Status: Widowed    Tobacco Counseling Counseling given: Not Answered    Clinical Intake:  Pre-visit preparation completed: Yes  Pain : No/denies pain    BMI - recorded: 29.24 Nutritional Status: BMI 25 -29 Overweight Nutritional Risks: None Diabetes: Yes CBG done?: Yes (BS 177 yesterday am at home) CBG resulted in Enter/ Edit results?: No Did pt. bring in CBG monitor from home?: No  Lab Results  Component Value Date   HGBA1C 6.2 (A) 11/29/2022   HGBA1C 7.9 (H) 05/27/2022   HGBA1C 6.4 (A) 07/28/2021     How often do you need to have someone help you when you read instructions, pamphlets, or other written materials from your doctor or pharmacy?: 1 - Never  Interpreter Needed?: No  Comments: lives with daughter Information entered by :: B.Esmerelda Finnigan,LPN   Activities of Daily Living     07/20/2023   11:43 AM  In your present state of health, do you have any difficulty performing the following activities:  Hearing? 1  Vision? 1  Difficulty concentrating or making decisions? 1  Walking or climbing stairs? 1  Dressing or bathing? 0  Doing errands, shopping? 1  Preparing Food and eating ? N  Using the Toilet? N  In the past six  months, have you accidently leaked urine? Y  Do you have problems with loss of bowel control? N  Managing your Medications? Y  Managing your Finances? Y  Housekeeping or managing your Housekeeping? Y    Patient Care Team: Doreene Nest, NP as PCP - General (Internal Medicine) Van Clines, MD as Consulting Physician (Neurology) Benita Gutter, RN as Oncology Nurse Navigator Yetter, Garry Heater, [facility] (Inactive) as Pharmacist (Pharmacist) Van Clines, MD as Consulting Physician (Neurology)  Indicate any recent Medical Services you may have received from other than [facility] providers in the past year (date may be approximate).     Assessment:   This is a routine wellness examination for [place].  Hearing/Vision screen Hearing Screening - Comments:: Pt says her hearing is not good  Audiology referral Vision Screening - Comments:: Pt vision not good;readers Dr Sinda Du   Goals Addressed   None    Depression Screen     07/20/2023   11:40 AM 11/08/2022   11:34 AM 05/27/2022    2:30 PM 03/01/2022    1:18 PM 01/27/2022    4:41 PM 01/16/2021   11:06 AM 01/16/2021   11:05 AM  PHQ 2/9 Scores  PHQ - 2 Score 1 0 3 2 3  0 0  PHQ- 9 Score  6 6 5 8 3      Fall Risk     07/20/2023   11:36  AM 01/18/2023    2:26 PM 08/24/2022    2:27 PM 05/27/2022    2:29 PM 02/08/2022   11:39 AM  Fall Risk   Falls in the past year? 1 1 1 1 1   Comment     Simultaneous filing. User may not have seen previous data.  Number falls in past yr: 0 1 1 1 1   Injury with Fall? 0 0 1 1 0  Comment     Simultaneous filing. User may not have seen previous data.  Risk for fall due to : Impaired mobility;Impaired vision;Impaired balance/gait  Impaired balance/gait;Impaired mobility No Fall Risks   Follow up Education provided;Falls prevention discussed Falls evaluation completed Falls evaluation completed Falls evaluation completed Falls evaluation completed    MEDICARE RISK AT HOME:  Medicare Risk at  Home Any stairs in or around the home?: Yes If so, are there any without handrails?: Yes Home free of loose throw rugs in walkways, pet beds, electrical cords, etc?: Yes Adequate lighting in your home to reduce risk of falls?: Yes Life alert?: No (pt refuses) Use of a cane, walker or w/c?: Yes (walker) Grab bars in the bathroom?: No Shower chair or bench in shower?: Yes Elevated toilet seat or a handicapped toilet?: No  TIMED UP AND GO:  Was the test performed?  No  Cognitive Function: 6CIT completed    02/08/2022   12:00 PM 03/02/2021   11:00 AM 02/26/2019   11:31 AM  MMSE - Mini Mental State Exam  Orientation to time 4 5 5   Orientation to Place 5 5 5   Registration 3 3 3   Attention/ Calculation 5 4 5   Recall 3 3 3   Language- name 2 objects 2 2   Language- repeat 1 1 1   Language- follow 3 step command 2 2   Language- read & follow direction 1 1   Write a sentence 1 1   Copy design 1 1   Total score 28 28       02/08/2017    3:00 PM  Montreal Cognitive Assessment   Visuospatial/ Executive (0/5) 3  Naming (0/3) 1  Attention: Read list of digits (0/2) 2  Attention: Read list of letters (0/1) 0  Attention: Serial 7 subtraction starting at 100 (0/3) 3  Language: Repeat phrase (0/2) 2  Language : Fluency (0/1) 0  Abstraction (0/2) 2  Delayed Recall (0/5) 3  Orientation (0/6) 6  Total 22      07/20/2023   11:44 AM 03/01/2022    1:13 PM 01/16/2021   11:00 AM  6CIT Screen  What Year? 0 points 0 points 0 points  What month? 0 points 0 points 0 points  What time? 0 points 0 points 0 points  Count back from 20 2 points 0 points 0 points  Months in reverse 0 points 0 points 0 points  Repeat phrase 8 points 2 points 0 points  Total Score 10 points 2 points 0 points    Immunizations Immunization History  Administered Date(s) Administered   Fluad Quad(high Dose 65+) 05/05/2020, 01/15/2021, 01/27/2022   Fluad Trivalent(High Dose 65+) 02/17/2023   Influenza,inj,Quad  PF,6+ Mos 01/29/2018, 03/04/2019   PFIZER Comirnaty(Gray Top)Covid-19 Tri-Sucrose Vaccine 07/03/2020   PFIZER(Purple Top)SARS-COV-2 Vaccination 05/21/2019, 06/10/2019, 07/03/2020   Pneumococcal Polysaccharide-23 06/20/2018   Tdap 12/09/2018   Zoster Recombinant(Shingrix) 03/06/2019    Screening Tests Health Maintenance  Topic Date Due   Zoster Vaccines- Shingrix (2 of 2) 05/01/2019   Pneumonia Vaccine 35+ Years old (2  of 2 - PCV) 06/21/2019   COVID-19 Vaccine (5 - 2024-25 season) 01/01/2023   Medicare Annual Wellness (AWV)  03/02/2023   OPHTHALMOLOGY EXAM  05/25/2023   HEMOGLOBIN A1C  06/01/2023   FOOT EXAM  11/29/2023   DTaP/Tdap/Td (2 - Td or Tdap) 12/08/2028   INFLUENZA VACCINE  Completed   DEXA SCAN  Completed   HPV VACCINES  Aged Out    Health Maintenance  Health Maintenance Due  Topic Date Due   Zoster Vaccines- Shingrix (2 of 2) 05/01/2019   Pneumonia Vaccine 36+ Years old (2 of 2 - PCV) 06/21/2019   COVID-19 Vaccine (5 - 2024-25 season) 01/01/2023   Medicare Annual Wellness (AWV)  03/02/2023   OPHTHALMOLOGY EXAM  05/25/2023   HEMOGLOBIN A1C  06/01/2023   Health Maintenance Items Addressed:   Additional Screening:  Vision Screening: Recommended annual ophthalmology exams for early detection of glaucoma and other disorders of the eye.  Dental Screening: Recommended annual dental exams for proper oral hygiene  Community Resource Referral / Chronic Care Management: CRR required this visit?  No   CCM required this visit?  No    Plan:     I have personally reviewed and noted the following in the patient's chart:   Medical and social history Use of alcohol, tobacco or illicit drugs  Current medications and supplements including opioid prescriptions. Patient is not currently taking opioid prescriptions. Functional ability and status Nutritional status Physical activity Advanced directives List of other physicians Hospitalizations, surgeries, and ER  visits in previous 12 months Vitals Screenings to include cognitive, depression, and falls Referrals and appointments  In addition, I have reviewed and discussed with patient certain preventive protocols, quality metrics, and best practice recommendations. A written personalized care plan for preventive services as well as general preventive health recommendations were provided to patient.    Sue Lush, LPN   6/96/2952   After Visit Summary: (MyChart) Due to this being a telephonic visit, the after visit summary with patients personalized plan was offered to patient via MyChart   Notes: Nothing significant to report at this time.

## 2023-07-20 NOTE — Patient Instructions (Signed)
 Shannon Higgins , Thank you for taking time to come for your Medicare Wellness Visit. I appreciate your ongoing commitment to your health goals. Please review the following plan we discussed and let me know if I can assist you in the future.   Referrals/Orders/Follow-Ups/Clinician Recommendations: none  This is a list of the screening recommended for you and due dates:  Health Maintenance  Topic Date Due   Zoster (Shingles) Vaccine (2 of 2) 05/01/2019   Pneumonia Vaccine (2 of 2 - PCV) 06/21/2019   COVID-19 Vaccine (5 - 2024-25 season) 01/01/2023   Medicare Annual Wellness Visit  03/02/2023   Eye exam for diabetics  05/25/2023   Hemoglobin A1C  06/01/2023   Complete foot exam   11/29/2023   DTaP/Tdap/Td vaccine (2 - Td or Tdap) 12/08/2028   Flu Shot  Completed   DEXA scan (bone density measurement)  Completed   HPV Vaccine  Aged Out    Advanced directives: (Copy Requested) Please bring a copy of your health care power of attorney and living will to the office to be added to your chart at your convenience. You can mail to [facility] [address] [place], [place] [address] or email to [email or web address]  Next Medicare Annual Wellness Visit scheduled for next year: Yes 07/23/24 @ 11:30am televisit

## 2023-07-21 ENCOUNTER — Ambulatory Visit: Admitting: Primary Care

## 2023-07-21 ENCOUNTER — Encounter: Payer: Self-pay | Admitting: Primary Care

## 2023-07-21 ENCOUNTER — Ambulatory Visit
Admission: RE | Admit: 2023-07-21 | Discharge: 2023-07-21 | Disposition: A | Discharge status: Home / Self Care | Source: Ambulatory Visit | Attending: Primary Care | Admitting: Primary Care

## 2023-07-21 VITALS — BP 122/76 | HR 60 | Temp 98.0°F | Ht 62.25 in | Wt 163.5 lb

## 2023-07-21 DIAGNOSIS — E1142 Type 2 diabetes mellitus with diabetic polyneuropathy: Secondary | ICD-10-CM | POA: Diagnosis not present

## 2023-07-21 DIAGNOSIS — K219 Gastro-esophageal reflux disease without esophagitis: Secondary | ICD-10-CM | POA: Diagnosis not present

## 2023-07-21 DIAGNOSIS — Z1231 Encounter for screening mammogram for malignant neoplasm of breast: Secondary | ICD-10-CM | POA: Diagnosis not present

## 2023-07-21 DIAGNOSIS — H6121 Impacted cerumen, right ear: Secondary | ICD-10-CM

## 2023-07-21 DIAGNOSIS — E785 Hyperlipidemia, unspecified: Secondary | ICD-10-CM | POA: Diagnosis not present

## 2023-07-21 DIAGNOSIS — E039 Hypothyroidism, unspecified: Secondary | ICD-10-CM | POA: Diagnosis not present

## 2023-07-21 DIAGNOSIS — Z853 Personal history of malignant neoplasm of breast: Secondary | ICD-10-CM

## 2023-07-21 DIAGNOSIS — M792 Neuralgia and neuritis, unspecified: Secondary | ICD-10-CM

## 2023-07-21 DIAGNOSIS — G8929 Other chronic pain: Secondary | ICD-10-CM

## 2023-07-21 DIAGNOSIS — E2839 Other primary ovarian failure: Secondary | ICD-10-CM

## 2023-07-21 DIAGNOSIS — I10 Essential (primary) hypertension: Secondary | ICD-10-CM | POA: Diagnosis not present

## 2023-07-21 DIAGNOSIS — G4733 Obstructive sleep apnea (adult) (pediatric): Secondary | ICD-10-CM

## 2023-07-21 DIAGNOSIS — C569 Malignant neoplasm of unspecified ovary: Secondary | ICD-10-CM

## 2023-07-21 DIAGNOSIS — Z0000 Encounter for general adult medical examination without abnormal findings: Secondary | ICD-10-CM

## 2023-07-21 DIAGNOSIS — M545 Low back pain, unspecified: Secondary | ICD-10-CM | POA: Diagnosis not present

## 2023-07-21 DIAGNOSIS — G4700 Insomnia, unspecified: Secondary | ICD-10-CM

## 2023-07-21 DIAGNOSIS — R519 Headache, unspecified: Secondary | ICD-10-CM

## 2023-07-21 DIAGNOSIS — F331 Major depressive disorder, recurrent, moderate: Secondary | ICD-10-CM

## 2023-07-21 DIAGNOSIS — H6120 Impacted cerumen, unspecified ear: Secondary | ICD-10-CM | POA: Insufficient documentation

## 2023-07-21 DIAGNOSIS — F0390 Unspecified dementia without behavioral disturbance: Secondary | ICD-10-CM

## 2023-07-21 LAB — LIPID PANEL
Cholesterol: 169 mg/dL (ref 0–200)
HDL: 45.9 mg/dL (ref >39.00)
LDL Cholesterol: 47 mg/dL (ref 0–99)
NonHDL: 122.8
Total CHOL/HDL Ratio: 4
Triglycerides: 381 mg/dL — ABNORMAL HIGH (ref 0.0–149.0)
VLDL: 76.2 mg/dL — ABNORMAL HIGH (ref 0.0–40.0)

## 2023-07-21 LAB — HEMOGLOBIN A1C: Hgb A1c MFr Bld: 8.9 % — ABNORMAL HIGH (ref 4.6–6.5)

## 2023-07-21 NOTE — Assessment & Plan Note (Signed)
 Controlled. Following with neurology.  Continue Botox injections. Continue propranolol ER 80 mg daily and sumatriptan 25 mg as needed.

## 2023-07-21 NOTE — Assessment & Plan Note (Signed)
 Stable.  Continue gabapentin 600 mg daily, meloxicam 15 mg as needed.

## 2023-07-21 NOTE — Assessment & Plan Note (Signed)
 Following with urogynecology, office notes reviewed from March 2025. Follow-up with oncology as scheduled.

## 2023-07-21 NOTE — Assessment & Plan Note (Signed)
 Right cerumen impaction identified on exam. Patient consented to irrigation of canal of right side.  Right canal irrigated. Patient tolerated well. TM's and canals post irrigation unremarkable.   Discussed home care instructions.

## 2023-07-21 NOTE — Assessment & Plan Note (Signed)
 Deteriorated with A1c of 8.9 today.  Unfortunately, she is not compliant to her medications on a daily basis.  Will try to understand if she is taking metformin every day.  If she is taking metformin daily then we will increase her metformin dose to 2 tablets daily.  If she is not taking metformin consistently and then we will have her start taking metformin 500 mg ER daily.  Close follow-up in 3 months.

## 2023-07-21 NOTE — Patient Instructions (Signed)
Stop by the lab prior to leaving today. I will notify you of your results once received.   [facility] to [facility] your [facility] density scan.   It was a pleasure to see you today!

## 2023-07-21 NOTE — Progress Notes (Signed)
 Subjective:    Patient ID: ZOIE SARIN, female    DOB: 1935-03-06, 89 y.o.   MRN: [id number]  HPI  MAILY DEBARGE is a very pleasant 89 y.o. female with a significant medical history including sleep apnea, hypertension, type 2 diabetes, hypothyroidism, dementia, recurrent UTI, hyperlipidemia, anemia, MDD, TIA, breast cancer who presents today for complete physical and follow up of chronic conditions.  Her daughter joins [place] today.  She has noticed progressing memory troubles with her mother.  Symptoms include repetitive questioning, forgetting recent events, forgetting to take medications, refusing to bathe herself.  She spends most of her day in the bed.  Her family is having a difficult time taking care of her.  They question if there is something that can be done to get them help in the home.  She currently resides with her daughter who recently lost her husband unexpectedly.  Immunizations: -Tetanus: Completed in 2020 -Influenza: Completed this season  -Shingles: Completed Shingrix x 1  -Pneumonia: Completed pneumovax in 2020  Diet: Fair diet.  Exercise: No regular exercise.  Eye exam: Completes annually  Dental exam: Completes semi-annually    Mammogram: Completed in November 2023, scheduled for today Bone Density Scan: Completed in 2021  BP Readings from Last 3 Encounters:  07/21/23 122/76  06/28/23 (!) 169/80  06/15/23 136/78         Review of Systems  Constitutional:  Negative for unexpected weight change.  HENT:  Negative for rhinorrhea.   Respiratory:  Negative for cough and shortness of breath.   Cardiovascular:  Negative for chest pain.  Gastrointestinal:  Negative for constipation and diarrhea.  Genitourinary:  Negative for difficulty urinating.  Musculoskeletal:  Positive for arthralgias.  Skin:  Negative for rash.  Allergic/Immunologic: Negative for environmental allergies.  Neurological:  Negative for dizziness and headaches.   Psychiatric/Behavioral:  The patient is not nervous/anxious.          Past Medical History:  Diagnosis Date   Acute bronchitis 05/13/2022   Acute metabolic encephalopathy 04/23/2020   Acute tubular injury of transplanted kidney (HCC) 06/14/2016   AKI (acute kidney injury) (HCC) 06/14/2016   Asthma    Breast cancer (HCC) 2015   left   Bronchitis    CAP (community acquired pneumonia) 04/29/2012   Chronic sinusitis    Closed fracture of medial malleolus 05/11/2018   Community acquired pneumonia 04/29/2012   Confusion 12/06/2016   COVID-19 virus infection 12/17/2020   Diabetes mellitus without complication (HCC)    Family history of breast cancer    GERD (gastroesophageal reflux disease)    History of ankle fracture 05/14/2018   Last Assessment & Plan:  With a recent fall. Right medial malleous. Has ortho follow up soon, splinted now and tylenol helps her pain   History of recurrent UTIs    Hypertension    Hypokalemia 04/26/2012   Hyponatremia    Hypothyroidism    Imbalance 09/02/2019   Insomnia    Migraine    Neuropathic pain    Orthostatic hypotension 05/08/2018   Ovarian cancer (HCC) 2022   Ovarian mass, left 07/14/2020   Personal history of breast cancer    Personal history of chemotherapy current   bilateral ovarian ca   Pneumonia    Rheumatoid arthritis (HCC)    Sepsis (HCC) 04/26/2012   Sepsis secondary to UTI (HCC) 04/26/2012   Sleep apnea    Stroke [facility])    seen in CT scan   Thyroid disease    Transient  hypotension 06/14/2016    Social History   Socioeconomic History   Marital status: Widowed    Spouse name: Not on file   Number of children: 2   Years of education: Not on file   Highest education level: Some college, no degree  Occupational History   Occupation: retired   Tobacco Use   Smoking status: Never   Smokeless tobacco: Never  Vaping Use   Vaping status: Never Used  Substance and Sexual Activity   Alcohol use: No   Drug use: No    Sexual activity: Not Currently    Partners: Male    Birth control/protection: Post-menopausal  Other Topics Concern   Not on file  Social History Narrative   Pt lives in 1 story home with her daughter, [name] and [name]'s husband   Has 2 adult daughters   Highest level of education: some college   Worked mainly as Environmental health practitioner.   Social Drivers of Corporate investment banker Strain: Low Risk  (07/20/2023)   Overall Financial Resource Strain (CARDIA)    Difficulty of Paying Living Expenses: Not hard at all  Food Insecurity: No Food Insecurity (07/20/2023)   Hunger Vital Sign    Worried About Running Out of Food in the Last Year: Never true    Ran Out of Food in the Last Year: Never true  Transportation Needs: No Transportation Needs (07/20/2023)   PRAPARE - Administrator, Civil Service (Medical): No    Lack of Transportation (Non-Medical): No  Physical Activity: Inactive (07/20/2023)   Exercise Vital Sign    Days of Exercise per Week: 0 days    Minutes of Exercise per Session: 0 min  Stress: No Stress Concern Present (07/20/2023)   [employer] of Occupational Health - Occupational Stress Questionnaire    Feeling of Stress : Not at all  Social Connections: Moderately Isolated (07/20/2023)   Social Connection and Isolation Panel [NHANES]    Frequency of Communication with Friends and Family: Twice a week    Frequency of Social Gatherings with Friends and Family: Three times a week    Attends Religious Services: 1 to 4 times per year    Active Member of Clubs or Organizations: No    Attends Banker Meetings: Never    Marital Status: Widowed  Intimate Partner Violence: Not At Risk (07/20/2023)   Humiliation, Afraid, Rape, and Kick questionnaire    Fear of Current or Ex-Partner: No    Emotionally Abused: No    Physically Abused: No    Sexually Abused: No    Past Surgical History:  Procedure Laterality Date   ABDOMINAL HYSTERECTOMY     BACK  SURGERY     BREAST BIOPSY Right ?`   benign   BREAST LUMPECTOMY Left 2015   breast ca   CHOLECYSTECTOMY     JOINT REPLACEMENT     LAPAROTOMY N/A 07/14/2020   Procedure: LAPAROTOMY;  Surgeon: Nadara Mustard, MD;  Location: [facility] ORS;  Service: Gynecology;  Laterality: N/A;   OOPHORECTOMY     OVARIAN CYST REMOVAL     PORTA CATH INSERTION N/A 07/27/2020   Procedure: PORTA CATH INSERTION;  Surgeon: Annice Needy, MD;  Location: [facility] INVASIVE CV LAB;  Service: Cardiovascular;  Laterality: N/A;   PORTA CATH REMOVAL N/A 04/05/2021   Procedure: PORTA CATH REMOVAL;  Surgeon: Annice Needy, MD;  Location: [facility] INVASIVE CV LAB;  Service: Cardiovascular;  Laterality: N/A;   REPLACEMENT TOTAL KNEE Right  SALPINGOOPHORECTOMY Bilateral 07/14/2020   Procedure: OPEN SALPINGO OOPHORECTOMY;  Surgeon: Nadara Mustard, MD;  Location: [facility] ORS;  Service: Gynecology;  Laterality: Bilateral;   TOTAL SHOULDER REPLACEMENT Left     Family History  Problem Relation Age of Onset   Hypertension Mother    Diabetes Daughter    Hypertension Daughter    Fibromyalgia Daughter    GER disease Daughter    Fibromyalgia Daughter    Crohn's disease Daughter    Asthma Daughter    Breast cancer Niece    Uterine cancer Niece    Breast cancer Other    Bladder Cancer Neg Hx     No Known Allergies  Current Outpatient Medications on File Prior to Visit  Medication Sig Dispense Refill   Accu-Chek FastClix Lancets MISC USE AS INSTRUCTED TO TEST BLOOD SUGAR DAILY 102 each 1   ACCU-CHEK GUIDE test strip 1 EACH BY IN VITRO ROUTE IN THE MORNING, AT NOON, AND AT BEDTIME. MAY SUBSTITUTE TO ANY MANUFACTURER COVERED BY PATIENT'S INSURANCE. 300 strip 0   albuterol (VENTOLIN HFA) 108 (90 Base) MCG/ACT inhaler Inhale 1-2 puffs into the lungs every 6 (six) hours as needed. 18 g 0   aspirin EC 325 MG tablet Take 325 mg by mouth daily.     b complex vitamins capsule Take 1 capsule by mouth daily.     blood glucose meter kit and  supplies Dispense based on patient and insurance preference. Use up to 2 times daily as directed. (FOR ICD-10 E10.9, E11.9). 1 each 0   Blood Glucose Monitoring Suppl DEVI 1 each by Does not apply route in the morning, at noon, and at bedtime. May substitute to any manufacturer covered by patient's insurance. 1 each 0   botulinum toxin Type A (BOTOX) 200 units injection INJECT 155 UNITS INTRAMUSCULARLY INTO MULTIPLE SITES OF FACE, HEAD, AND NECK EVERY 90 DAYS 1 each 4   chlorpheniramine-HYDROcodone (TUSSIONEX) 10-8 MG/5ML Take 5 mLs by mouth every 12 (twelve) hours as needed. 50 mL 0   Cranberry 1000 MG CAPS Take 1 capsule by mouth 2 (two) times daily.     donepezil (ARICEPT) 10 MG tablet TAKE 1 TABLET BY MOUTH EVERYDAY AT BEDTIME 90 tablet 0   dorzolamide-timolol (COSOPT) 22.3-6.8 MG/ML ophthalmic solution Place 1 drop into both eyes 2 (two) times daily.     DULoxetine (CYMBALTA) 60 MG capsule TAKE 1 CAPSULE EVERY DAY FOR ANXIETY AND DEPRESSION. 90 capsule 2   estradiol (ESTRACE) 0.1 MG/GM vaginal cream Apply at bedtime, 1g nightly for 2 weeks, followed by 1g 3 times a week 42.5 g 3   fluticasone (FLONASE) 50 MCG/ACT nasal spray USE 2 SPRAYS IN EACH NOSTRIL DAILY AS NEEDED FOR ALLERGIES OR RHINITIS 48 g 2   gabapentin (NEURONTIN) 600 MG tablet Take 600 mg by mouth daily.     Lancets Misc. MISC 1 each by Does not apply route in the morning, at noon, and at bedtime. May substitute to any manufacturer covered by patient's insurance. 300 each 0   latanoprost (XALATAN) 0.005 % ophthalmic solution Place 1 drop into both eyes at bedtime.     levothyroxine (SYNTHROID) 112 MCG tablet TAKE 1 TABLET EVERY MORNING MONDAY THROUGH SATURDAY; AND 1/2 TABLET ON SUNDAY. TAKE ON AN EMPTY STOMACH WITH WATER ONLY 78 tablet 1   meclizine (ANTIVERT) 25 MG tablet Take 1 tablet (25 mg total) by mouth 2 (two) times daily as needed for dizziness. 180 tablet 0   meloxicam (MOBIC) 15 MG tablet TAKE  1 TABLET EVERY DAY AS NEEDED  FOR PAIN 90 tablet 0   memantine (NAMENDA) 5 MG tablet TAKE 1 TABLET BY MOUTH TWICE A DAY 180 tablet 1   metFORMIN (GLUCOPHAGE-XR) 500 MG 24 hr tablet TAKE 1 TABLET EVERY DAY WITH BREAKFAST FOR DIABETES 90 tablet 0   mirtazapine (REMERON) 15 MG tablet Take 1 tablet (15 mg total) by mouth at bedtime. For sleep 90 tablet 0   Netarsudil Dimesylate (RHOPRESSA) 0.02 % SOLN Place 1 drop into both eyes daily at 6 (six) AM.     nystatin cream (MYCOSTATIN) Apply 1 Application topically 2 (two) times daily as needed. 30 g 2   omeprazole (PRILOSEC) 20 MG capsule TAKE 1 CAPSULE TWICE DAILY FOR HEARTBURN. 180 capsule 0   pilocarpine (PILOCAR) 2 % ophthalmic solution Place 1 drop into both eyes 4 (four) times daily.      propranolol (INDERAL) 80 MG tablet TAKE 1 TABLET EVERY DAY AS DIRECTED FOR HEADACHE PREVENTION. 90 tablet 2   rosuvastatin (CRESTOR) 10 MG tablet Take 1 tablet (10 mg total) by mouth daily. 90 tablet 0   SUMAtriptan (IMITREX) 25 MG tablet TAKE 1 TABLET AS DIRECTED EVERY 2 HOURS AS NEEDED FOR MIGRAINE. MAY REPEAT IN 2 HOURS IF HEADACHE PERSISTS OR RECURS. 9 tablet 11   triamcinolone ointment (KENALOG) 0.5 % Apply 1 Application topically 2 (two) times daily. 30 g 0   VITAMIN D PO Take by mouth.     No current facility-administered medications on file prior to visit.    BP 122/76 (BP Location: Left Arm, Patient Position: Sitting, Cuff Size: Normal)   Pulse 60   Temp 98 F (36.7 C)   Ht 5' 2.25" (1.581 m)   Wt 163 lb 8 oz (74.2 kg)   SpO2 95%   BMI 29.66 kg/m  Objective:   Physical Exam HENT:     Right Ear: Tympanic membrane and ear canal normal. There is impacted cerumen.     Left Ear: Tympanic membrane and ear canal normal. There is no impacted cerumen.  Eyes:     Pupils: Pupils are equal, round, and reactive to light.  Cardiovascular:     Rate and Rhythm: Normal rate and regular rhythm.  Pulmonary:     Effort: Pulmonary effort is normal.     Breath sounds: Normal breath sounds.   Abdominal:     General: Bowel sounds are normal.     Palpations: Abdomen is soft.     Tenderness: There is no abdominal tenderness.  Musculoskeletal:        General: Normal range of motion.     Cervical back: Neck supple.  Skin:    General: Skin is warm and dry.  Neurological:     Mental Status: She is alert and oriented to person, place, and time.     Cranial Nerves: No cranial nerve deficit.     Deep Tendon Reflexes:     Reflex Scores:      Patellar reflexes are 2+ on the right side and 2+ on the left side. Psychiatric:        Mood and Affect: Mood normal.           Assessment & Plan:  Preventative health care Assessment & Plan: Immunizations UTD.  Mammogram due, scheduled for today   Discussed the importance of a healthy diet and regular exercise in order for weight loss, and to reduce the risk of further co-morbidity.  Exam stable. Labs pending.  Follow up in 1 year  for repeat physical.    Estrogen deficiency -     DG Bone Density; Future  Hyperlipidemia, unspecified hyperlipidemia type Assessment & Plan: Controlled. Continue rosuvastatin 10 mg daily.  Orders: -     Lipid panel  Type 2 diabetes mellitus with peripheral neuropathy (HCC) Assessment & Plan: Deteriorated with A1c of 8.9 today.  Unfortunately, she is not compliant to her medications on a daily basis.  Will try to understand if she is taking metformin every day.  If she is taking metformin daily then we will increase her metformin dose to 2 tablets daily.  If she is not taking metformin consistently and then we will have her start taking metformin 500 mg ER daily.  Close follow-up in 3 months.  Orders: -     Hemoglobin A1c  Hypertension, unspecified type Assessment & Plan: Controlled.  Continue to monitor. Continue off meds.   Obstructive sleep apnea syndrome Assessment & Plan: Noncompliant to CPAP machine due to intolerance. Refuses further evaluation.   Gastroesophageal  reflux disease, unspecified whether esophagitis present Assessment & Plan: Controlled.  Continue omeprazole 20 mg twice daily.   High grade ovarian cancer [facility]) Assessment & Plan: Following with urogynecology, office notes reviewed from March 2025. Follow-up with oncology as scheduled.   Hypothyroidism, unspecified type Assessment & Plan: Inconsistent use of levothyroxine, also taking levothyroxine with her other medications despite prior instructions for proper administration.  Continue levothyroxine 112 mcg daily. TSH reviewed from December 2024   Dementia arising in the senium and presenium [facility]) Assessment & Plan: Deteriorated.  Continue Namenda 5 mg twice daily and Aricept 10 mg at bedtime.  Referral placed for VBCI to help family with care options.  Orders: -     AMB Referral VBCI Care Management  Chronic nonintractable headache, unspecified headache type Assessment & Plan: Controlled. Following with neurology.  Continue Botox injections. Continue propranolol ER 80 mg daily and sumatriptan 25 mg as needed.   Chronic low back pain, unspecified back pain laterality, unspecified whether sciatica present Assessment & Plan: Stable.  Continue gabapentin 600 mg daily, meloxicam 15 mg as needed.   History of breast cancer Assessment & Plan: Mammogram due and scheduled for today.   Insomnia, unspecified type Assessment & Plan: Controlled.  Continue mirtazapine 15 mg at bedtime.   Moderate episode of recurrent major depressive disorder [facility]) Assessment & Plan: Denies concerns today.  Continue duloxetine 60 mg daily.   Neuropathic pain Assessment & Plan: Stable.  Continue gabapentin 600 mg daily.   Impacted cerumen of right ear Assessment & Plan: Right cerumen impaction identified on exam. Patient consented to irrigation of canal of right side.  Right canal irrigated. Patient tolerated well. TM's and canals post irrigation unremarkable.    Discussed home care instructions.           Doreene Nest, NP

## 2023-07-21 NOTE — Assessment & Plan Note (Signed)
Controlled.  Continue to monitor. Continue off meds.

## 2023-07-21 NOTE — Assessment & Plan Note (Signed)
 Controlled. Continue rosuvastatin 10 mg daily.

## 2023-07-21 NOTE — Assessment & Plan Note (Signed)
 Noncompliant to CPAP machine due to intolerance. Refuses further evaluation.

## 2023-07-21 NOTE — Assessment & Plan Note (Signed)
 Mammogram due and scheduled for today.

## 2023-07-21 NOTE — Assessment & Plan Note (Signed)
 Immunizations UTD.  Mammogram due, scheduled for today   Discussed the importance of a healthy diet and regular exercise in order for weight loss, and to reduce the risk of further co-morbidity.  Exam stable. Labs pending.  Follow up in 1 year for repeat physical.

## 2023-07-21 NOTE — Assessment & Plan Note (Signed)
 Inconsistent use of levothyroxine, also taking levothyroxine with her other medications despite prior instructions for proper administration.  Continue levothyroxine 112 mcg daily. TSH reviewed from December 2024

## 2023-07-21 NOTE — Assessment & Plan Note (Addendum)
 Stable.  Continue gabapentin 600 mg daily.

## 2023-07-21 NOTE — Assessment & Plan Note (Signed)
 Deteriorated.  Continue Namenda 5 mg twice daily and Aricept 10 mg at bedtime.  Referral placed for VBCI to help family with care options.

## 2023-07-21 NOTE — Assessment & Plan Note (Signed)
Controlled.  Continue mirtazapine 15 mg at bedtime.

## 2023-07-21 NOTE — Assessment & Plan Note (Signed)
 Controlled.  Continue omeprazole 20 mg twice daily.

## 2023-07-21 NOTE — Assessment & Plan Note (Signed)
 Denies concerns today.  Continue duloxetine 60 mg daily.

## 2023-07-24 ENCOUNTER — Telehealth: Payer: Self-pay

## 2023-07-24 NOTE — Progress Notes (Unsigned)
 Complex Care Management Note Care Guide Note  07/24/2023 Name: Shannon Higgins MRN: [id number] DOB: [birth year]   Complex Care Management Outreach Attempts: An unsuccessful telephone outreach was attempted today to offer the patient information about available complex care management services.  Follow Up Plan:  Additional outreach attempts will be made to offer the patient complex care management information and services.   Encounter Outcome:  No Answer  Penne Lash , RMA     [place]  [facility], [facility] Guide  Direct Dial: [phone]  Website: [email or web address]

## 2023-07-25 ENCOUNTER — Other Ambulatory Visit: Payer: Self-pay | Admitting: Primary Care

## 2023-07-25 DIAGNOSIS — E1142 Type 2 diabetes mellitus with diabetic polyneuropathy: Secondary | ICD-10-CM

## 2023-07-25 MED ORDER — METFORMIN HCL ER 500 MG PO TB24: 1000.0000 mg | ORAL_TABLET | Freq: Every day | ORAL | 1 refills | Status: DC

## 2023-07-26 NOTE — Progress Notes (Unsigned)
 Complex Care Management Note Care Guide Note  07/26/2023 Name: Shannon Higgins MRN: [id number] DOB: 21-Oct-1934   Complex Care Management Outreach Attempts: A second unsuccessful outreach was attempted today to offer the patient with information about available complex care management services.  Follow Up Plan:  Additional outreach attempts will be made to offer the patient complex care management information and services.   Encounter Outcome:  No Answer  Penne Lash , RMA     [place]  [facility], [facility] Guide  Direct Dial: [phone]  Website: [email or web address]

## 2023-07-27 NOTE — Progress Notes (Signed)
 Complex Care Management Note  Care Guide Note 07/27/2023 Name: Shannon Higgins MRN: [id number] DOB: December 02, 1934  [name] is a 89 y.o. year old female who sees Doreene Nest, NP for primary care. I reached out to [name] by phone today to offer complex care management services.  [name] was given information about Complex Care Management services today including:   The Complex Care Management services include support from the care team which includes your Nurse Care Manager, Clinical Social Worker, or Pharmacist.  The Complex Care Management team is here to help remove barriers to the health concerns and goals most important to you. Complex Care Management services are voluntary, and the patient may decline or stop services at any time by request to their care team member.   Complex Care Management Consent Status: Patient agreed to services and verbal consent obtained.   Follow up plan:  Telephone appointment with complex care management team member scheduled for:  08/08/2023  Encounter Outcome:  Patient Scheduled  Penne Lash , RMA     [place]  [facility], [facility] Guide  Direct Dial: [phone]  Website: [email or web address]

## 2023-08-07 ENCOUNTER — Other Ambulatory Visit: Payer: Self-pay

## 2023-08-07 ENCOUNTER — Ambulatory Visit: Payer: Self-pay | Admitting: *Deleted

## 2023-08-07 NOTE — Patient Outreach (Signed)
 Complex Care Management   Visit Note  08/07/2023  Name:  Shannon Higgins MRN: [id number] DOB: 1935-03-22  Situation: Referral received for Complex Care Management related to Dementia I obtained verbal consent from [facility] obtained consent prior to call.  Visit completed with patient's daughter   on the phone  Background:   Past Medical History:  Diagnosis Date   Acute bronchitis 05/13/2022   Acute metabolic encephalopathy 04/23/2020   Acute tubular injury of transplanted kidney (HCC) 06/14/2016   AKI (acute kidney injury) (HCC) 06/14/2016   Asthma    Breast cancer (HCC) 2015   left   Bronchitis    CAP (community acquired pneumonia) 04/29/2012   Chronic sinusitis    Closed fracture of medial malleolus 05/11/2018   Community acquired pneumonia 04/29/2012   Confusion 12/06/2016   COVID-19 virus infection 12/17/2020   Diabetes mellitus without complication (HCC)    Family history of breast cancer    GERD (gastroesophageal reflux disease)    History of ankle fracture 05/14/2018   Last Assessment & Plan:  With a recent fall. Right medial malleous. Has ortho follow up soon, splinted now and tylenol helps her pain   History of recurrent UTIs    Hypertension    Hypokalemia 04/26/2012   Hyponatremia    Hypothyroidism    Imbalance 09/02/2019   Insomnia    Migraine    Neuropathic pain    Orthostatic hypotension 05/08/2018   Ovarian cancer (HCC) 2022   Ovarian mass, left 07/14/2020   Personal history of breast cancer    Personal history of chemotherapy current   bilateral ovarian ca   Pneumonia    Rheumatoid arthritis (HCC)    Sepsis (HCC) 04/26/2012   Sepsis secondary to UTI (HCC) 04/26/2012   Sleep apnea    Stroke [facility])    seen in CT scan   Thyroid disease    Transient hypotension 06/14/2016    Assessment: Patient Reported Symptoms:  Cognitive Able to follow simple commands, Struggling with memory recall  Neurological      HEENT No symptoms reported     Cardiovascular No symptoms reported    Respiratory No symptoms reported    Endocrine No symptoms reported    Gastrointestinal  (reflux)    Genitourinary Incontinence    Integumentary No symptoms reported    Musculoskeletal Unsteady gait    Psychosocial No symptoms reported     Vitals:   08/07/23 1125  BP: 122/76    Medications Reviewed Today     Reviewed by Wenda Overland, LCSW (Social Worker) on 08/07/23 at 1126  Med List Status: <None>   Medication Order Taking? Sig Documenting Provider Last Dose Status Informant  Accu-Chek FastClix Lancets MISC [id number] No USE AS INSTRUCTED TO TEST BLOOD SUGAR DAILY Doreene Nest, NP Taking Active   ACCU-CHEK GUIDE test strip [id number] No 1 EACH BY IN VITRO ROUTE IN THE MORNING, AT NOON, AND AT BEDTIME. MAY SUBSTITUTE TO ANY MANUFACTURER COVERED BY PATIENT'S INSURANCE. Doreene Nest, NP Taking Active   albuterol (VENTOLIN HFA) 108 (90 Base) MCG/ACT inhaler [id number] No Inhale 1-2 puffs into the lungs every 6 (six) hours as needed. Excell Seltzer, MD Taking Active   aspirin EC 325 MG tablet [id number] No Take 325 mg by mouth daily. [provider] Taking Active Family Member  b complex vitamins capsule [id number] No Take 1 capsule by mouth daily. [provider] Taking Active   blood glucose meter kit and supplies [id number] No Dispense based on  patient and insurance preference. Use up to 2 times daily as directed. (FOR ICD-10 E10.9, E11.9). Doreene Nest, NP Taking Active   Blood Glucose Monitoring Suppl DEVI [id number] No 1 each by Does not apply route in the morning, at noon, and at bedtime. May substitute to any manufacturer covered by patient's insurance. Doreene Nest, NP Taking Active   botulinum toxin Type A (BOTOX) 200 units injection [id number] No INJECT 155 UNITS INTRAMUSCULARLY INTO MULTIPLE SITES OF FACE, HEAD, AND NECK EVERY 90 DAYS Drema Dallas, DO Taking Active   Cranberry 1000 MG CAPS  [id number] No Take 1 capsule by mouth 2 (two) times daily. [provider] Taking Active            Med Note Kathyrn Sheriff   Wed Apr 07, 2021  3:00 PM)    donepezil (ARICEPT) 10 MG tablet [id number] No TAKE 1 TABLET BY MOUTH EVERYDAY AT BEDTIME Van Clines, MD Taking Active   dorzolamide-timolol (COSOPT) 22.3-6.8 MG/ML ophthalmic solution [id number] No Place 1 drop into both eyes 2 (two) times daily. [provider] Taking Active Family Member  DULoxetine (CYMBALTA) 60 MG capsule [id number] No TAKE 1 CAPSULE EVERY DAY FOR ANXIETY AND DEPRESSION. Doreene Nest, NP Taking Active   estradiol (ESTRACE) 0.1 MG/GM vaginal cream [id number] No Apply at bedtime, 1g nightly for 2 weeks, followed by 1g 3 times a week Wyatt Haste T, MD Taking Active   fluticasone (FLONASE) 50 MCG/ACT nasal spray [id number] No USE 2 SPRAYS IN EACH NOSTRIL DAILY AS NEEDED FOR ALLERGIES OR RHINITIS Doreene Nest, NP Taking Active   gabapentin (NEURONTIN) 600 MG tablet [id number]  Take 600 mg by mouth daily. [provider]  Active   Lancets Misc. MISC [id number] No 1 each by Does not apply route in the morning, at noon, and at bedtime. May substitute to any manufacturer covered by patient's insurance. Doreene Nest, NP Taking Active   latanoprost (XALATAN) 0.005 % ophthalmic solution [id number] No Place 1 drop into both eyes at bedtime. [provider] Taking Active Family Member  levothyroxine (SYNTHROID) 112 MCG tablet [id number] No TAKE 1 TABLET EVERY MORNING MONDAY THROUGH SATURDAY; AND 1/2 TABLET ON SUNDAY. TAKE ON AN EMPTY STOMACH WITH WATER ONLY Doreene Nest, NP Taking Active   meclizine (ANTIVERT) 25 MG tablet [id number] No Take 1 tablet (25 mg total) by mouth 2 (two) times daily as needed for dizziness. Doreene Nest, NP Taking Active   meloxicam [facility]) 15 MG tablet [id number] No TAKE 1 TABLET EVERY DAY AS NEEDED FOR PAIN Doreene Nest, NP Taking Active    memantine (NAMENDA) 5 MG tablet [id number] No TAKE 1 TABLET BY MOUTH TWICE A DAY Van Clines, MD Taking Active   metFORMIN (GLUCOPHAGE-XR) 500 MG 24 hr tablet [id number]  Take 2 tablets (1,000 mg total) by mouth daily with breakfast. for diabetes. Doreene Nest, NP  Active   mirtazapine (REMERON) 15 MG tablet [id number] No Take 1 tablet (15 mg total) by mouth at bedtime. For sleep Doreene Nest, NP Taking Active   Netarsudil Dimesylate (RHOPRESSA) 0.02 % SOLN [id number] No Place 1 drop into both eyes daily at 6 (six) AM. [provider] Taking Active Family Member  nystatin cream (MYCOSTATIN) [id number] No Apply 1 Application topically 2 (two) times daily as needed. Doreene Nest, NP Taking Active   omeprazole (PRILOSEC) 20 MG capsule [id number] No TAKE 1 CAPSULE TWICE DAILY FOR HEARTBURN. Eden Emms, NP Taking  Active   pilocarpine (PILOCAR) 2 % ophthalmic solution [id number] No Place 1 drop into both eyes 4 (four) times daily.  [provider] Taking Active Family Member  propranolol (INDERAL) 80 MG tablet [id number] No TAKE 1 TABLET EVERY DAY AS DIRECTED FOR HEADACHE PREVENTION. Doreene Nest, NP Taking Active   rosuvastatin (CRESTOR) 10 MG tablet [id number] No Take 1 tablet (10 mg total) by mouth daily. Eden Emms, NP Taking Active   SUMAtriptan (IMITREX) 25 MG tablet [id number] No TAKE 1 TABLET AS DIRECTED EVERY 2 HOURS AS NEEDED FOR MIGRAINE. MAY REPEAT IN 2 HOURS IF HEADACHE PERSISTS OR RECURS. Van Clines, MD Taking Active   triamcinolone ointment (KENALOG) 0.5 % [id number] No Apply 1 Application topically 2 (two) times daily. Doreene Nest, NP Taking Active   VITAMIN D PO [id number] No Take by mouth. [provider] Taking Active             Recommendation:   No recommendations at this time, resources provided, all questions answered during call related to in home care support options  Follow Up Plan:   Closing From:  Complex  Care Management  Robinn Overholt, LCSW [place], [facility] Licensed Clinical Social Worker Care Coordinator  Direct Dial: [phone]

## 2023-08-07 NOTE — Patient Instructions (Signed)
 Visit Information  Thank you for taking time to visit with me today. Please don't hesitate to contact me if I can be of assistance to you before our next scheduled appointment.  Our next appointment is no further scheduled appointments.  Please call the care guide team at [phone] if you need to cancel or reschedule your appointment.   Following is a copy of your care plan:   Goals Addressed             This Visit's Progress    COMPLETED: [facility] Social Work Care Plan       Problems:   Cognitive Deficits and Lacks knowledge of how to connect   CSW Clinical Goal(s):   Patient's daughter provided with requested resources for in home support during visit today. All questions answered, no follow up needed at this time.  Interventions:  Dementia Care:   Current level of care: Home with other family or significant other(s): Adult daughter  Evaluation of patient safety in current living environment and review of Dementia resources and support (patient's daughter confirmed being patient's primary caregiver, also has support from family and friends-no additional support requested at this time) ADL's Assessed needs, level of care concerns, how currently meeting needs and barriers to care Discuss community support options (Respite care through [facility] [place]) Discussed private pay options for personal care needs (list to be provided as needed) Active listening / Reflection utilized  Patient Goals/Self-Care Activities:  Physicist, [facility] [place]  to follow up on caregiver resources and support. If needed  Plan:   No further follow up required:          Please call 911 if you are experiencing a Mental Health or Behavioral Health Crisis or need someone to talk to.  Patient verbalizes understanding of instructions and care plan provided today and agrees to view in MyChart. Active MyChart status and patient understanding of how to access instructions and care  plan via MyChart confirmed with patient.     Ruth Kovich, LCSW [place]  [facility], [facility] Licensed Clinical Social Worker Care Coordinator  Direct Dial: [phone]

## 2023-08-08 ENCOUNTER — Encounter: Admitting: *Deleted

## 2023-08-11 DIAGNOSIS — H401133 Primary open-angle glaucoma, bilateral, severe stage: Secondary | ICD-10-CM | POA: Diagnosis not present

## 2023-08-11 DIAGNOSIS — H5310 Unspecified subjective visual disturbances: Secondary | ICD-10-CM | POA: Diagnosis not present

## 2023-08-13 DIAGNOSIS — G8929 Other chronic pain: Secondary | ICD-10-CM

## 2023-08-14 MED ORDER — PROPRANOLOL HCL 80 MG PO TABS: 80.0000 mg | ORAL_TABLET | Freq: Every day | ORAL | 2 refills | Status: DC

## 2023-08-25 ENCOUNTER — Telehealth: Payer: Self-pay | Admitting: Primary Care

## 2023-08-25 DIAGNOSIS — R531 Weakness: Secondary | ICD-10-CM

## 2023-08-25 DIAGNOSIS — R296 Repeated falls: Secondary | ICD-10-CM

## 2023-08-25 NOTE — Telephone Encounter (Signed)
 Copied from CRM [id number]. Topic: Clinical - Medication Question >> Aug 25, 2023 12:01 PM Shannon Higgins wrote: Reason for CRM: pt called to speak with Dr. Fulton Job to request a referral for in home physical therapy. Please call pt back at [phone]

## 2023-08-25 NOTE — Telephone Encounter (Signed)
 Called to get more information no needs. Mail box full. Not able to leave message at this time.

## 2023-08-28 NOTE — Telephone Encounter (Unsigned)
 Copied from CRM [id number]. Topic: General - Other >> Aug 25, 2023  4:51 PM Felizardo Hotter wrote: Reason for CRM: Pt returning call from Rona Cobia, CMA regarding Home Health Care, pt needs referral. Please call pt at [phone]

## 2023-08-28 NOTE — Addendum Note (Signed)
 Addended by: Fleurette Woolbright K on: 08/28/2023 05:52 PM   Modules accepted: Orders

## 2023-08-28 NOTE — Telephone Encounter (Signed)
 Please call patient:  Let her know that I placed an order for home health PT for her balance and history of falls

## 2023-08-29 NOTE — Telephone Encounter (Signed)
 Lvm for pt to give office a call back. Was notifying pt that referral was placed. Okay to relay

## 2023-08-30 NOTE — Telephone Encounter (Signed)
 Left message to return call to our office.

## 2023-08-31 NOTE — Telephone Encounter (Signed)
 Copied from CRM [id number]. Topic: General - Other >> Aug 31, 2023 10:29 AM Adonis Hoot wrote: Reason for CRM: Patients daughter returned call to [name].I let there know that PT referral was placed for her mom.

## 2023-09-08 DIAGNOSIS — I951 Orthostatic hypotension: Secondary | ICD-10-CM

## 2023-09-08 DIAGNOSIS — F03918 Unspecified dementia, unspecified severity, with other behavioral disturbance: Secondary | ICD-10-CM | POA: Diagnosis not present

## 2023-09-08 DIAGNOSIS — J329 Chronic sinusitis, unspecified: Secondary | ICD-10-CM

## 2023-09-08 DIAGNOSIS — J45909 Unspecified asthma, uncomplicated: Secondary | ICD-10-CM

## 2023-09-08 DIAGNOSIS — G43909 Migraine, unspecified, not intractable, without status migrainosus: Secondary | ICD-10-CM

## 2023-09-08 DIAGNOSIS — C569 Malignant neoplasm of unspecified ovary: Secondary | ICD-10-CM

## 2023-09-08 DIAGNOSIS — I10 Essential (primary) hypertension: Secondary | ICD-10-CM

## 2023-09-08 DIAGNOSIS — F0393 Unspecified dementia, unspecified severity, with mood disturbance: Secondary | ICD-10-CM | POA: Diagnosis not present

## 2023-09-08 DIAGNOSIS — G4700 Insomnia, unspecified: Secondary | ICD-10-CM

## 2023-09-08 DIAGNOSIS — G4733 Obstructive sleep apnea (adult) (pediatric): Secondary | ICD-10-CM | POA: Diagnosis not present

## 2023-09-08 DIAGNOSIS — E1142 Type 2 diabetes mellitus with diabetic polyneuropathy: Secondary | ICD-10-CM

## 2023-09-08 DIAGNOSIS — F331 Major depressive disorder, recurrent, moderate: Secondary | ICD-10-CM | POA: Diagnosis not present

## 2023-09-12 ENCOUNTER — Other Ambulatory Visit: Payer: Self-pay

## 2023-09-12 DIAGNOSIS — Z08 Encounter for follow-up examination after completed treatment for malignant neoplasm: Secondary | ICD-10-CM

## 2023-09-13 ENCOUNTER — Inpatient Hospital Stay: Payer: Medicare HMO | Attending: Oncology

## 2023-09-13 ENCOUNTER — Encounter: Payer: Self-pay | Admitting: Oncology

## 2023-09-13 ENCOUNTER — Inpatient Hospital Stay (HOSPITAL_BASED_OUTPATIENT_CLINIC_OR_DEPARTMENT_OTHER): Payer: Medicare HMO | Admitting: Oncology

## 2023-09-13 DIAGNOSIS — Z08 Encounter for follow-up examination after completed treatment for malignant neoplasm: Secondary | ICD-10-CM | POA: Diagnosis not present

## 2023-09-13 DIAGNOSIS — Z8543 Personal history of malignant neoplasm of ovary: Secondary | ICD-10-CM | POA: Diagnosis present

## 2023-09-13 DIAGNOSIS — Z90722 Acquired absence of ovaries, bilateral: Secondary | ICD-10-CM | POA: Insufficient documentation

## 2023-09-13 DIAGNOSIS — Z9079 Acquired absence of other genital organ(s): Secondary | ICD-10-CM | POA: Insufficient documentation

## 2023-09-13 DIAGNOSIS — Z803 Family history of malignant neoplasm of breast: Secondary | ICD-10-CM | POA: Diagnosis not present

## 2023-09-13 DIAGNOSIS — Z8049 Family history of malignant neoplasm of other genital organs: Secondary | ICD-10-CM | POA: Diagnosis not present

## 2023-09-13 DIAGNOSIS — Z9221 Personal history of antineoplastic chemotherapy: Secondary | ICD-10-CM | POA: Insufficient documentation

## 2023-09-13 LAB — CMP (CANCER CENTER ONLY)
ALT: 15 U/L (ref 0–44)
AST: 25 U/L (ref 15–41)
Albumin: 4 g/dL (ref 3.5–5.0)
Alkaline Phosphatase: 72 U/L (ref 38–126)
Anion gap: 10 (ref 5–15)
BUN: 23 mg/dL (ref 8–23)
CO2: 21 mmol/L — ABNORMAL LOW (ref 22–32)
Calcium: 9.2 mg/dL (ref 8.9–10.3)
Chloride: 104 mmol/L (ref 98–111)
Creatinine: 1.02 mg/dL — ABNORMAL HIGH (ref 0.44–1.00)
GFR, Estimated: 54 mL/min — ABNORMAL LOW (ref >60)
Glucose, Bld: 196 mg/dL — ABNORMAL HIGH (ref 70–99)
Potassium: 4.1 mmol/L (ref 3.5–5.1)
Sodium: 135 mmol/L (ref 135–145)
Total Bilirubin: 0.4 mg/dL (ref 0.0–1.2)
Total Protein: 7.7 g/dL (ref 6.5–8.1)

## 2023-09-13 LAB — CBC WITH DIFFERENTIAL (CANCER CENTER ONLY)
Abs Immature Granulocytes: 0.02 10*3/uL (ref 0.00–0.07)
Basophils Absolute: 0 10*3/uL (ref 0.0–0.1)
Basophils Relative: 0 %
Eosinophils Absolute: 0.2 10*3/uL (ref 0.0–0.5)
Eosinophils Relative: 3 %
HCT: 39 % (ref 36.0–46.0)
Hemoglobin: 12.6 g/dL (ref 12.0–15.0)
Immature Granulocytes: 0 %
Lymphocytes Relative: 25 %
Lymphs Abs: 1.8 10*3/uL (ref 0.7–4.0)
MCH: 30.6 pg (ref 26.0–34.0)
MCHC: 32.3 g/dL (ref 30.0–36.0)
MCV: 94.7 fL (ref 80.0–100.0)
Monocytes Absolute: 0.5 10*3/uL (ref 0.1–1.0)
Monocytes Relative: 7 %
Neutro Abs: 4.6 10*3/uL (ref 1.7–7.7)
Neutrophils Relative %: 65 %
Platelet Count: 235 10*3/uL (ref 150–400)
RBC: 4.12 MIL/uL (ref 3.87–5.11)
RDW: 13.2 % (ref 11.5–15.5)
WBC Count: 7.1 10*3/uL (ref 4.0–10.5)
nRBC: 0 % (ref 0.0–0.2)

## 2023-09-13 NOTE — Progress Notes (Unsigned)
 Patient was feeling more nausea for the past two weeks.

## 2023-09-14 ENCOUNTER — Other Ambulatory Visit: Payer: Self-pay | Admitting: Primary Care

## 2023-09-14 ENCOUNTER — Ambulatory Visit: Payer: Medicare Other | Admitting: Obstetrics

## 2023-09-14 ENCOUNTER — Encounter: Payer: Self-pay | Admitting: Obstetrics

## 2023-09-14 VITALS — BP 132/83 | HR 58

## 2023-09-14 DIAGNOSIS — M545 Low back pain, unspecified: Secondary | ICD-10-CM

## 2023-09-14 DIAGNOSIS — E039 Hypothyroidism, unspecified: Secondary | ICD-10-CM

## 2023-09-14 DIAGNOSIS — Z87440 Personal history of urinary (tract) infections: Secondary | ICD-10-CM

## 2023-09-14 DIAGNOSIS — N393 Stress incontinence (female) (male): Secondary | ICD-10-CM

## 2023-09-14 DIAGNOSIS — R2689 Other abnormalities of gait and mobility: Secondary | ICD-10-CM | POA: Diagnosis not present

## 2023-09-14 DIAGNOSIS — R102 Pelvic and perineal pain: Secondary | ICD-10-CM | POA: Diagnosis not present

## 2023-09-14 DIAGNOSIS — M792 Neuralgia and neuritis, unspecified: Secondary | ICD-10-CM

## 2023-09-14 DIAGNOSIS — E785 Hyperlipidemia, unspecified: Secondary | ICD-10-CM

## 2023-09-14 DIAGNOSIS — N390 Urinary tract infection, site not specified: Secondary | ICD-10-CM

## 2023-09-14 LAB — CA 125: Cancer Antigen (CA) 125: 16.2 U/mL (ref 0.0–38.1)

## 2023-09-14 MED ORDER — PHENAZOPYRIDINE HCL 200 MG PO TABS: 200.0000 mg | ORAL_TABLET | Freq: Three times a day (TID) | ORAL | 0 refills | Status: AC | PRN

## 2023-09-14 MED ORDER — METHENAMINE HIPPURATE 1 G PO TABS: 1.0000 g | ORAL_TABLET | Freq: Two times a day (BID) | ORAL | 2 refills | Status: DC

## 2023-09-14 NOTE — Progress Notes (Deleted)
 Dysuria started around over 1 week.  Self started Bactrim  for 7 days.  Vaginal estrogen use 1/month

## 2023-09-14 NOTE — Progress Notes (Signed)
 Hematology/Oncology Consult note [facility]  [phone] [phone]  Patient Care Team: Gabriel John, NP as PCP - General (Internal Medicine) Jhonny Moss, MD as Consulting Physician (Neurology) Rochell Chroman, RN as Oncology Nurse Navigator [place], Delaney Fearing, [facility] (Inactive) as Pharmacist (Pharmacist) Jhonny Moss, MD as Consulting Physician (Neurology) Cindra Cree, MD as Consulting Physician (Ophthalmology) Avonne Boettcher, MD as Consulting Physician (Oncology)   Name of the patient: Shannon Higgins  [id number]  [birth year]   Date of visit: 09/14/23  Diagnosis- 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  She will is diagnosed with UTI and as a part of her Work-up had CT abdomen and pelvis with contrast.  That showed a 13.5 11.8 x 15.2 cm simple cystic appearing area within the pelvis.  Ultrasound of the pelvis showed 17 x 10.7 x 11.9 cm complex cystic midline pelvic mass.  Findings indeterminate and could reflect a cystic ovarian neoplasm.  She did have CA-125 checked which was normal at 22.9 but HD4 was elevated at 99.  Patient underwent bilateral salpingo-oophorectomy.  She has had prior hysterectomy.  Pathology showed high-grade serous carcinoma involving both ovaries but no involvement of the fallopian tube.  Omentum was negative for malignancy.  Lymph nodes not sampled.  Tumor size 4 cm high-grade.  FIGO stage IC peritoneal/ascitic fluid involvement was not identified.  Patient has baseline neuropathy. Therefore carbo/doxil  chosen for adjuvant chemotherapy upto 6 cycles if tolerated.    Patient completed 4 cycles of Carbo doxil   chemotherapy in July 2022 but could not tolerate further doses and was therefore stopped. She has been in remission since then.    Interval history- she is doing well for her age. No recent hospitalizations. Denies any recent falls. Appetite and weight have remained stable. Denies any nausea/ vomiting diarrhea or abdominal distension  ECOG PS- 2 Pain scale- 0 Opioid associated constipation- no  Review of systems- Review of Systems  Constitutional:  Negative for chills, fever, malaise/fatigue and weight loss.  HENT:  Negative for congestion, ear discharge and nosebleeds.   Eyes:  Negative for blurred vision.  Respiratory:  Negative for cough, hemoptysis, sputum production, shortness of breath and wheezing.   Cardiovascular:  Negative for chest pain, palpitations, orthopnea and claudication.  Gastrointestinal:  Negative for abdominal pain, blood in stool, constipation, diarrhea, heartburn, melena, nausea and vomiting.  Genitourinary:  Negative for dysuria, flank pain, frequency, hematuria and urgency.  Musculoskeletal:  Negative for back pain, joint pain and myalgias.  Skin:  Negative for rash.  Neurological:  Negative for dizziness, tingling, focal weakness, seizures, weakness and headaches.  Endo/Heme/Allergies:  Does not bruise/bleed easily.  Psychiatric/Behavioral:  Negative for depression and suicidal ideas. The patient does not have insomnia.       No Known Allergies   Past Medical History:  Diagnosis Date   Acute bronchitis 05/13/2022   Acute metabolic encephalopathy 04/23/2020   Acute tubular injury of transplanted kidney (HCC) 06/14/2016   AKI (acute kidney injury) (HCC) 06/14/2016   Asthma    Breast cancer (HCC) 2015   left   Bronchitis    CAP (community acquired pneumonia) 04/29/2012   Chronic sinusitis    Closed fracture of medial malleolus  05/11/2018   Community acquired pneumonia 04/29/2012   Confusion 12/06/2016   COVID-19 virus infection 12/17/2020   Diabetes  mellitus without complication [facility])    Family history of breast cancer    GERD (gastroesophageal reflux disease)    History of ankle fracture 05/14/2018   Last Assessment & Plan:  With a recent fall. Right medial malleous. Has ortho follow up soon, splinted now and tylenol  helps her pain   History of recurrent UTIs    Hypertension    Hypokalemia 04/26/2012   Hyponatremia    Hypothyroidism    Imbalance 09/02/2019   Insomnia    Migraine    Neuropathic pain    Orthostatic hypotension 05/08/2018   Ovarian cancer (HCC) 2022   Ovarian mass, left 07/14/2020   Personal history of breast cancer    Personal history of chemotherapy current   bilateral ovarian ca   Pneumonia    Rheumatoid arthritis (HCC)    Sepsis (HCC) 04/26/2012   Sepsis secondary to UTI (HCC) 04/26/2012   Sleep apnea    Stroke [facility])    seen in CT scan   Thyroid  disease    Transient hypotension 06/14/2016     Past Surgical History:  Procedure Laterality Date   ABDOMINAL HYSTERECTOMY     BACK SURGERY     BREAST BIOPSY Right ?`   benign   BREAST LUMPECTOMY Left 2015   breast ca   CHOLECYSTECTOMY     JOINT REPLACEMENT     LAPAROTOMY N/A 07/14/2020   Procedure: LAPAROTOMY;  Surgeon: Alben Alma, MD;  Location: [facility] ORS;  Service: Gynecology;  Laterality: N/A;   OOPHORECTOMY     OVARIAN CYST REMOVAL     PORTA CATH INSERTION N/A 07/27/2020   Procedure: PORTA CATH INSERTION;  Surgeon: Celso College, MD;  Location: [facility] INVASIVE CV LAB;  Service: Cardiovascular;  Laterality: N/A;   PORTA CATH REMOVAL N/A 04/05/2021   Procedure: PORTA CATH REMOVAL;  Surgeon: Celso College, MD;  Location: [facility] INVASIVE CV LAB;  Service: Cardiovascular;  Laterality: N/A;   REPLACEMENT TOTAL KNEE Right    SALPINGOOPHORECTOMY Bilateral 07/14/2020   Procedure: OPEN SALPINGO OOPHORECTOMY;  Surgeon: Alben Alma, MD;  Location: [facility] ORS;  Service: Gynecology;  Laterality: Bilateral;   TOTAL SHOULDER REPLACEMENT Left     Social  History   Socioeconomic History   Marital status: Widowed    Spouse name: Not on file   Number of children: 2   Years of education: Not on file   Highest education level: Some college, no degree  Occupational History   Occupation: retired   Tobacco Use   Smoking status: Never   Smokeless tobacco: Never  Vaping Use   Vaping status: Never Used  Substance and Sexual Activity   Alcohol use: No   Drug use: No   Sexual activity: Not Currently    Partners: Male    Birth control/protection: Post-menopausal  Other Topics Concern   Not on file  Social History Narrative   Pt lives in 1 story home with her daughter, Burdette Carolin and [name]'s husband   Has 2 adult daughters   Highest level of education: some college   Worked mainly as Environmental health practitioner.   Social Drivers of Corporate investment banker Strain: Low Risk  (07/20/2023)   Overall Financial Resource Strain (CARDIA)    Difficulty of Paying Living Expenses: Not hard at all  Food Insecurity: No Food Insecurity (08/07/2023)   Hunger Vital Sign    Worried  About Running Out of Food in the Last Year: Never true    Ran Out of Food in the Last Year: Never true  Transportation Needs: No Transportation Needs (08/07/2023)   PRAPARE - Administrator, Civil Service (Medical): No    Lack of Transportation (Non-Medical): No  Physical Activity: Inactive (07/20/2023)   Exercise Vital Sign    Days of Exercise per Week: 0 days    Minutes of Exercise per Session: 0 min  Stress: No Stress Concern Present (07/20/2023)   [employer] of Occupational Health - Occupational Stress Questionnaire    Feeling of Stress : Not at all  Social Connections: Moderately Isolated (07/20/2023)   Social Connection and Isolation Panel [NHANES]    Frequency of Communication with Friends and Family: Twice a week    Frequency of Social Gatherings with Friends and Family: Three times a week    Attends Religious Services: 1 to 4 times per year    Active  Member of Clubs or Organizations: No    Attends Banker Meetings: Never    Marital Status: Widowed  Intimate Partner Violence: Not At Risk (08/07/2023)   Humiliation, Afraid, Rape, and Kick questionnaire    Fear of Current or Ex-Partner: No    Emotionally Abused: No    Physically Abused: No    Sexually Abused: No    Family History  Problem Relation Age of Onset   Hypertension Mother    Diabetes Daughter    Hypertension Daughter    Fibromyalgia Daughter    GER disease Daughter    Fibromyalgia Daughter    Crohn's disease Daughter    Asthma Daughter    Breast cancer Niece    Uterine cancer Niece    Breast cancer Other    Bladder Cancer Neg Hx    Renal cancer Neg Hx      Current Outpatient Medications:    Accu-Chek FastClix Lancets MISC, USE AS INSTRUCTED TO TEST BLOOD SUGAR DAILY, Disp: 102 each, Rfl: 1   ACCU-CHEK GUIDE test strip, 1 EACH BY IN VITRO ROUTE IN THE MORNING, AT NOON, AND AT BEDTIME. MAY SUBSTITUTE TO ANY MANUFACTURER COVERED BY PATIENT'S INSURANCE., Disp: 300 strip, Rfl: 0   albuterol  (VENTOLIN  HFA) 108 (90 Base) MCG/ACT inhaler, Inhale 1-2 puffs into the lungs every 6 (six) hours as needed., Disp: 18 g, Rfl: 0   aspirin  EC 325 MG tablet, Take 325 mg by mouth daily., Disp: , Rfl:    b complex vitamins capsule, Take 1 capsule by mouth daily., Disp: , Rfl:    blood glucose meter kit and supplies, Dispense based on patient and insurance preference. Use up to 2 times daily as directed. (FOR ICD-10 E10.9, E11.9)., Disp: 1 each, Rfl: 0   Blood Glucose Monitoring Suppl DEVI, 1 each by Does not apply route in the morning, at noon, and at bedtime. May substitute to any manufacturer covered by patient's insurance., Disp: 1 each, Rfl: 0   botulinum toxin Type A  (BOTOX ) 200 units injection, INJECT 155 UNITS INTRAMUSCULARLY INTO MULTIPLE SITES OF FACE, HEAD, AND NECK EVERY 90 DAYS, Disp: 1 each, Rfl: 4   Cranberry 1000 MG CAPS, Take 1 capsule by mouth 2 (two) times  daily., Disp: , Rfl:    donepezil  (ARICEPT ) 10 MG tablet, TAKE 1 TABLET BY MOUTH EVERYDAY AT BEDTIME, Disp: 90 tablet, Rfl: 0   dorzolamide -timolol  (COSOPT ) 22.3-6.8 MG/ML ophthalmic solution, Place 1 drop into both eyes 2 (two) times daily., Disp: , Rfl:  DULoxetine  (CYMBALTA ) 60 MG capsule, TAKE 1 CAPSULE EVERY DAY FOR ANXIETY AND DEPRESSION., Disp: 90 capsule, Rfl: 2   estradiol  (ESTRACE ) 0.1 MG/GM vaginal cream, Apply at bedtime, 1g nightly for 2 weeks, followed by 1g 3 times a week, Disp: 42.5 g, Rfl: 3   fluticasone  (FLONASE ) 50 MCG/ACT nasal spray, USE 2 SPRAYS IN EACH NOSTRIL DAILY AS NEEDED FOR ALLERGIES OR RHINITIS, Disp: 48 g, Rfl: 2   gabapentin  (NEURONTIN ) 600 MG tablet, Take 600 mg by mouth daily., Disp: , Rfl:    Lancets Misc. MISC, 1 each by Does not apply route in the morning, at noon, and at bedtime. May substitute to any manufacturer covered by patient's insurance., Disp: 300 each, Rfl: 0   latanoprost  (XALATAN ) 0.005 % ophthalmic solution, Place 1 drop into both eyes at bedtime., Disp: , Rfl:    levothyroxine  (SYNTHROID ) 112 MCG tablet, TAKE 1 TABLET EVERY MORNING MONDAY THROUGH SATURDAY; AND 1/2 TABLET ON SUNDAY. TAKE ON AN EMPTY STOMACH WITH WATER ONLY, Disp: 78 tablet, Rfl: 1   meclizine  (ANTIVERT ) 25 MG tablet, Take 1 tablet (25 mg total) by mouth 2 (two) times daily as needed for dizziness., Disp: 180 tablet, Rfl: 0   meloxicam  (MOBIC ) 15 MG tablet, TAKE 1 TABLET EVERY DAY AS NEEDED FOR PAIN, Disp: 90 tablet, Rfl: 0   memantine  (NAMENDA ) 5 MG tablet, TAKE 1 TABLET BY MOUTH TWICE A DAY, Disp: 180 tablet, Rfl: 1   metFORMIN  (GLUCOPHAGE -XR) 500 MG 24 hr tablet, Take 2 tablets (1,000 mg total) by mouth daily with breakfast. for diabetes., Disp: 180 tablet, Rfl: 1   methenamine (HIPREX) 1 g tablet, Take 1 tablet (1 g total) by mouth 2 (two) times daily with a meal., Disp: 30 tablet, Rfl: 2   mirtazapine  (REMERON ) 15 MG tablet, Take 1 tablet (15 mg total) by mouth at bedtime. For  sleep, Disp: 90 tablet, Rfl: 0   Netarsudil Dimesylate (RHOPRESSA) 0.02 % SOLN, Place 1 drop into both eyes daily at 6 (six) AM., Disp: , Rfl:    nystatin  cream (MYCOSTATIN ), Apply 1 Application topically 2 (two) times daily as needed., Disp: 30 g, Rfl: 2   omeprazole  (PRILOSEC) 20 MG capsule, TAKE 1 CAPSULE TWICE DAILY FOR HEARTBURN., Disp: 180 capsule, Rfl: 0   phenazopyridine (PYRIDIUM) 200 MG tablet, Take 1 tablet (200 mg total) by mouth 3 (three) times daily as needed for pain., Disp: 10 tablet, Rfl: 0   pilocarpine  (PILOCAR) 2 % ophthalmic solution, Place 1 drop into both eyes 4 (four) times daily. , Disp: , Rfl:    propranolol  (INDERAL ) 80 MG tablet, Take 1 tablet (80 mg total) by mouth daily. For headache prevention, Disp: 90 tablet, Rfl: 2   rosuvastatin  (CRESTOR ) 10 MG tablet, Take 1 tablet (10 mg total) by mouth daily., Disp: 90 tablet, Rfl: 0   SUMAtriptan  (IMITREX ) 25 MG tablet, TAKE 1 TABLET AS DIRECTED EVERY 2 HOURS AS NEEDED FOR MIGRAINE. MAY REPEAT IN 2 HOURS IF HEADACHE PERSISTS OR RECURS., Disp: 9 tablet, Rfl: 11   triamcinolone  ointment (KENALOG ) 0.5 %, Apply 1 Application topically 2 (two) times daily., Disp: 30 g, Rfl: 0   VITAMIN D  PO, Take by mouth., Disp: , Rfl:   Physical exam:  Vitals:   09/13/23 1324  BP: 131/70  Pulse: 76  Resp: 18  Temp: 97.8 F (36.6 C)  TempSrc: Tympanic  SpO2: 96%  Weight: 161 lb (73 kg)   Physical Exam Cardiovascular:     Rate and Rhythm: Normal rate and  regular rhythm.     Heart sounds: Normal heart sounds.  Pulmonary:     Effort: Pulmonary effort is normal.     Breath sounds: Normal breath sounds.  Abdominal:     General: Bowel sounds are normal.     Palpations: Abdomen is soft.  Skin:    General: Skin is warm and dry.  Neurological:     Mental Status: She is alert and oriented to person, place, and time.      I have personally reviewed labs listed below:    Latest Ref Rng & Units 09/13/2023   12:51 PM  CMP  Glucose 70  - 99 mg/dL 191   BUN 8 - 23 mg/dL 23   Creatinine 4.78 - 1.00 mg/dL 2.95   Sodium 621 - 308 mmol/L 135   Potassium 3.5 - 5.1 mmol/L 4.1   Chloride 98 - 111 mmol/L 104   CO2 22 - 32 mmol/L 21   Calcium  8.9 - 10.3 mg/dL 9.2   Total Protein 6.5 - 8.1 g/dL 7.7   Total Bilirubin 0.0 - 1.2 mg/dL 0.4   Alkaline Phos 38 - 126 U/L 72   AST 15 - 41 U/L 25   ALT 0 - 44 U/L 15       Latest Ref Rng & Units 09/13/2023   12:51 PM  CBC  WBC 4.0 - 10.5 K/uL 7.1   Hemoglobin 12.0 - 15.0 g/dL 65.7   Hematocrit 84.6 - 46.0 % 39.0   Platelets 150 - 400 K/uL 235    I have personally reviewed Radiology images listed below: No images are attached to the encounter.  No results found.   Assessment and plan- Patient is a 89 y.o. female with h/o  high-grade serous carcinoma of the ovary FIGO stage IC pT1 cpNX cMX s/p bilateral salpingo-oophorectomy. She received adjuvant chemotherapy and is currently in remission. She is here for routine f/u  Clinically patient is doing well with no signs and symptoms of recurrence based on todays exam. Ca 125 is within normal limits. She follows up with gyn onc Q4 months as well. Scans only for suspicious signs/ symptoms which the patient does not have. I will see her back in 8 months with labs   Visit Diagnosis 1. Encounter for follow-up surveillance of ovarian cancer      Dr. Seretha Dance, MD, MPH [facility] [phone] 09/14/2023 12:28 PM

## 2023-09-14 NOTE — Assessment & Plan Note (Signed)
-   bilateral myofascial pelvic pain reproducible on prior exam with pelvic pressure - The origin of pelvic floor muscle spasm can be multifactorial, including primary, reactive to a different pain source, trauma, or even part of a centralized pain syndrome.Treatment options include pelvic floor physical therapy, local (vaginal) or oral  muscle relaxants, pelvic muscle trigger point injections or centrally acting pain medications.   - discussed increased risk of pain due to DM neuropathy, encouraged diet modification to improve glycemic control - referral sent for pelvic floor PT, however pt pending start of PT for ADLs and will postpone to reassess symptoms after PT

## 2023-09-14 NOTE — Assessment & Plan Note (Addendum)
-   06/15/23 catheterized UA + protein, ketones.  - self start bactrim  with improvement of dysuria, denies urine testing however reports home test with + leuk - prefer catheterized urine due to fecal incontinence with risk of contamination - reviewed diagnosis requires UTI symptoms and culture. Prior hospitalization due to Flu A and low suspicion for UTI - For treatment of recurrent urinary tract infections, we discussed management of recurrent UTIs including prophylaxis with a daily low dose antibiotic, transvaginal estrogen therapy, D-mannose, and cranberry supplements.  We discussed the role of diagnostic testing such as cystoscopy and upper tract imaging.   - continue cranberry extract, encouraged to start probiotics - encouraged to resume vaginal estrogen, Rx estring  cost prohibitive and pt declined  - discussed need to monitor glycemic control due to onset of increased UTI and increased HbA1C. - Cr 1.35 on 06/09/23 stable at 1.02 on 09/13/23 - start methenamine for UTI suppression until 3 months of vaginal estrogen use  - discussed importance of patient to return for urine testing when she experiences UTI symptoms to r/o LUTS. Encouraged Ibuprofen  and pyridium as needed for UTI symptoms to avoid antibiotic exposure and resistant uropathogens.

## 2023-09-14 NOTE — Progress Notes (Signed)
 [facility] Return Visit  SUBJECTIVE  History of Present Illness: Shannon Higgins is a 89 y.o. female seen in follow-up for recurrent UTI, stress urinary incontinence, fecal incontinence, pelvic pressure, and decreased mobility. Plan at last visit was vaginal estrogen,  referral to pelvic floor PT, timed voids, and bedside commode.   Patient reports dysuria started around over 1 week ago, denies fever, one sided back pain, hematuria. Home test with + leuk.  Self started Bactrim  for 7 days due to prior instruction to take 0.5 tab daily from [facility] infrequent vaginal estrogen use 1/month  Past Medical History: Patient  has a past medical history of Acute bronchitis (05/13/2022), Acute metabolic encephalopathy (04/23/2020), Acute tubular injury of transplanted kidney (HCC) (06/14/2016), AKI (acute kidney injury) (HCC) (06/14/2016), Asthma, Breast cancer (HCC) (2015), Bronchitis, CAP (community acquired pneumonia) (04/29/2012), Chronic sinusitis, Closed fracture of medial malleolus (05/11/2018), Community acquired pneumonia (04/29/2012), Confusion (12/06/2016), COVID-19 virus infection (12/17/2020), Diabetes mellitus without complication (HCC), Family history of breast cancer, GERD (gastroesophageal reflux disease), History of ankle fracture (05/14/2018), History of recurrent UTIs, Hypertension, Hypokalemia (04/26/2012), Hyponatremia, Hypothyroidism, Imbalance (09/02/2019), Insomnia, Migraine, Neuropathic pain, Orthostatic hypotension (05/08/2018), Ovarian cancer (HCC) (2022), Ovarian mass, left (07/14/2020), Personal history of breast cancer, Personal history of chemotherapy (current), Pneumonia, Rheumatoid arthritis (HCC), Sepsis (HCC) (04/26/2012), Sepsis secondary to UTI (HCC) (04/26/2012), Sleep apnea, Stroke (HCC), Thyroid  disease, and Transient hypotension (06/14/2016).   Past Surgical History: She  has a past surgical history that includes Cholecystectomy; Abdominal  hysterectomy; Total shoulder replacement (Left); Replacement total knee (Right); Back surgery; Joint replacement; Salpingoophorectomy (Bilateral, 07/14/2020); laparotomy (N/A, 07/14/2020); Ovarian cyst removal; Oophorectomy; PORTA CATH INSERTION (N/A, 07/27/2020); Breast biopsy (Right, ?`); Breast lumpectomy (Left, 2015); and PORTA CATH REMOVAL (N/A, 04/05/2021).   Medications: She has a current medication list which includes the following prescription(s): accu-chek fastclix lancets, accu-chek guide, albuterol , aspirin  ec, b complex vitamins, blood glucose meter kit and supplies, blood glucose monitoring suppl, botox , cranberry, donepezil , dorzolamide -timolol , duloxetine , estradiol , fluticasone , gabapentin , lancets misc., latanoprost , levothyroxine , meclizine , meloxicam , memantine , metformin , methenamine, mirtazapine , rhopressa, nystatin  cream, omeprazole , phenazopyridine, pilocarpine , propranolol , rosuvastatin , sumatriptan , triamcinolone  ointment, and vitamin d .   Allergies: Patient has no known allergies.   Social History: Patient  reports that she has never smoked. She has never used smokeless tobacco. She reports that she does not drink alcohol and does not use drugs.     OBJECTIVE     Physical Exam: Vitals:   09/14/23 1101  BP: 132/83  Pulse: (!) 58   Gen: No apparent distress, A&O x 3.  Detailed Urogynecologic Evaluation:  Deferred.   Lab Results  Component Value Date   CREATININE 1.02 (H) 09/13/2023   CREATININE 0.82 06/28/2023   CREATININE 1.35 (H) 06/09/2023         No data to display             ASSESSMENT AND PLAN    Shannon Higgins is a 89 y.o. with:  1. Recurrent UTI   2. SUI (stress urinary incontinence, female)   3. Pelvic pressure in female   4. Decreased mobility     Recurrent UTI Assessment & Plan: - 06/15/23 catheterized UA + protein, ketones.  - self start bactrim  with improvement of dysuria, denies urine testing however reports home test with + leuk -  prefer catheterized urine due to fecal incontinence with risk of contamination - reviewed diagnosis requires UTI symptoms and culture. Prior hospitalization due to Flu A and low suspicion for UTI - For treatment  of recurrent urinary tract infections, we discussed management of recurrent UTIs including prophylaxis with a daily low dose antibiotic, transvaginal estrogen therapy, D-mannose, and cranberry supplements.  We discussed the role of diagnostic testing such as cystoscopy and upper tract imaging.   - continue cranberry extract, encouraged to start probiotics - encouraged to resume vaginal estrogen, Rx estring  cost prohibitive and pt declined  - discussed need to monitor glycemic control due to onset of increased UTI and increased HbA1C. - Cr 1.35 on 06/09/23 stable at 1.02 on 09/13/23 - start methenamine for UTI suppression until 3 months of vaginal estrogen use  - discussed importance of patient to return for urine testing when she experiences UTI symptoms to r/o LUTS. Encouraged Ibuprofen  and pyridium as needed for UTI symptoms to avoid antibiotic exposure and resistant uropathogens.  Orders: -     Phenazopyridine HCl; Take 1 tablet (200 mg total) by mouth 3 (three) times daily as needed for pain.  Dispense: 10 tablet; Refill: 0 -     Methenamine Hippurate; Take 1 tablet (1 g total) by mouth 2 (two) times daily with a meal.  Dispense: 30 tablet; Refill: 2  SUI (stress urinary incontinence, female) Assessment & Plan: - improved with reduction of pull-up use to 1/day per patient - For treatment of stress urinary incontinence,  non-surgical options include expectant management, weight loss, physical therapy, as well as a pessary.  Surgical options include a midurethral sling, Burch urethropexy, and transurethral injection of a bulking agent. - will postpone referral to pelvic floor PT due to PT for ADLs - encouraged to resume vaginal estrogen - encouraged to avoid pad use if it causes irritation     Pelvic pressure in female Assessment & Plan: - bilateral myofascial pelvic pain reproducible on prior exam with pelvic pressure - The origin of pelvic floor muscle spasm can be multifactorial, including primary, reactive to a different pain source, trauma, or even part of a centralized pain syndrome.Treatment options include pelvic floor physical therapy, local (vaginal) or oral  muscle relaxants, pelvic muscle trigger point injections or centrally acting pain medications.   - discussed increased risk of pain due to DM neuropathy, encouraged diet modification to improve glycemic control - referral sent for pelvic floor PT, however pt pending start of PT for ADLs and will postpone to reassess symptoms after PT   Decreased mobility Assessment & Plan: - ambulates with rollator - possible functional urinary incontinence - repeated discussion of timed voids - consider bedside commode - pending PT for ADLs   Time spent: I spent 31 minutes dedicated to the care of this patient on the date of this encounter to include pre-visit review of records, face-to-face time with the patient discussing recurrent UTI, SUI, pelvic pressure, decreased mobility, and post visit documentation and ordering medications.   Darlene Ehlers, MD

## 2023-09-14 NOTE — Patient Instructions (Addendum)
 For vaginal atrophy (thinning of the vaginal tissue that can cause dryness and burning) and UTI prevention we discussed estrogen replacement in the form of vaginal cream.   Start vaginal estrogen therapy nightly for two weeks then 2 times weekly at night. This can be placed with your finger or an applicator inside the vagina and around the urethra.  Please let us  know if the prescription is too expensive and we can look for alternative options.   Is vaginal estrogen therapy safe for me? Vaginal estrogen preparations act on the vaginal skin, and only a very tiny amount is absorbed into the bloodstream (0.01%).  They work in a similar way to hand or face cream.  There is minimal absorption and they are therefore perfectly safe. If you have had breast cancer and have persistent troublesome symptoms which aren't settling with vaginal moisturisers and lubricants, local estrogen treatment may be a possibility, but consultation with your oncologist should take place first.   Consider starting probiotics  Please return for urine testing if you experience UTI symptoms.   Start Ibuprofen  up to 600mg  every 6 hours when you experience UTI symptoms.   Start pyridium 200mg  up to 3 times a day for UTI symptoms after you drop off a urine sample.   Start methenamine twice a day for 3 months.

## 2023-09-14 NOTE — Assessment & Plan Note (Signed)
-   improved with reduction of pull-up use to 1/day per patient - For treatment of stress urinary incontinence,  non-surgical options include expectant management, weight loss, physical therapy, as well as a pessary.  Surgical options include a midurethral sling, Burch urethropexy, and transurethral injection of a bulking agent. - will postpone referral to pelvic floor PT due to PT for ADLs - encouraged to resume vaginal estrogen - encouraged to avoid pad use if it causes irritation

## 2023-09-14 NOTE — Assessment & Plan Note (Signed)
-   ambulates with rollator - possible functional urinary incontinence - repeated discussion of timed voids - consider bedside commode - pending PT for ADLs

## 2023-09-15 DIAGNOSIS — K219 Gastro-esophageal reflux disease without esophagitis: Secondary | ICD-10-CM

## 2023-09-15 DIAGNOSIS — F331 Major depressive disorder, recurrent, moderate: Secondary | ICD-10-CM

## 2023-09-18 MED ORDER — OMEPRAZOLE 20 MG PO CPDR: DELAYED_RELEASE_CAPSULE | ORAL | 2 refills | Status: AC

## 2023-09-18 MED ORDER — DULOXETINE HCL 60 MG PO CPEP: 60.0000 mg | ORAL_CAPSULE | Freq: Every day | ORAL | 2 refills | Status: AC

## 2023-09-18 NOTE — Telephone Encounter (Signed)
 Please call patient's daughter:  How often is she taking the gabapentin  600 mg pill? I have in my notes once daily but wanted to check.

## 2023-09-18 NOTE — Telephone Encounter (Signed)
 Unable to reach patients daughter. Left voicemail to return call to our office.

## 2023-09-19 ENCOUNTER — Other Ambulatory Visit: Payer: Self-pay | Admitting: Primary Care

## 2023-09-19 DIAGNOSIS — G4700 Insomnia, unspecified: Secondary | ICD-10-CM

## 2023-09-19 NOTE — Telephone Encounter (Signed)
 Called and spoke with patients daughter she states patient is taking gabapentin  600mg  pill twice daily.

## 2023-09-19 NOTE — Telephone Encounter (Signed)
 Refills sent to pharmacy.

## 2023-09-29 ENCOUNTER — Other Ambulatory Visit: Payer: Self-pay | Admitting: Obstetrics

## 2023-09-29 DIAGNOSIS — N390 Urinary tract infection, site not specified: Secondary | ICD-10-CM

## 2023-10-03 ENCOUNTER — Other Ambulatory Visit: Payer: Self-pay | Admitting: Nurse Practitioner

## 2023-10-03 DIAGNOSIS — K219 Gastro-esophageal reflux disease without esophagitis: Secondary | ICD-10-CM

## 2023-10-03 DIAGNOSIS — E785 Hyperlipidemia, unspecified: Secondary | ICD-10-CM

## 2023-10-06 ENCOUNTER — Telehealth: Payer: Self-pay

## 2023-10-06 NOTE — Telephone Encounter (Signed)
 Copied from CRM [id number]. Topic: General - Other >> Oct 06, 2023  2:23 PM Jenice Mitts wrote: Reason for CR: Jena from [facility] home health is calling because the patient stated she's been experiencing dizziness and lightheadedness. She has not been drinking much water so she has been telling her to stay hydrated . When the patient is standing 112/90 and sitting with her feet elevated it goes up to 120 The patient has positive orthostatics

## 2023-10-06 NOTE — Telephone Encounter (Signed)
 Orthostatic hypotension is defined as a decrease in systolic blood pressure 20 mm hg when standing from either a lying or seated position or a decrease in diastolic blood pressure of 10 mm hg when standing from either a lying or seated position.  Based on the information below this is not orthostatic.  Can we get some additional detail?

## 2023-10-09 NOTE — Telephone Encounter (Signed)
 Left voicemail with [facility] office for Jenna to return call to office. No callback number for Jenna was documented

## 2023-10-10 NOTE — Telephone Encounter (Signed)
 Left voicemail with [facility] office for Jenna to return call to office. No callback number for Jenna was documented

## 2023-10-11 NOTE — Telephone Encounter (Signed)
 Noted. She is orthostatic based on these readings.  Her options are to increase water/fluid intake or we discontinue the propranolol  medication I have prescribed for headache prevention. Does the propranolol  medication even help with headaches? I know she gets Botox  injections for headaches.

## 2023-10-11 NOTE — Telephone Encounter (Signed)
 Received a phone call from [facility] she stated the original message was incorrect with the BP readings. The readings she gathered are below:   Laying 120/64  Sitting 112/60, 110 /60 Standing 90/60  She advised that patients BP was normally in the 140s which is why she reported the decrease. States patients daughter has had a hard time getting patient to drink fluids, said that pushing fluids is not an option as her mother will not increase the amount she is drinking. She has not spoken with patient since the visit to check on any symptoms she is experiencing. Next visit from [facility] is next week. Contact is [name] with [facility], contact number is [phone]

## 2023-10-11 NOTE — Telephone Encounter (Signed)
 Called and spoke with patients daughter, she said that Botox  injections increased in price and they could not afford anymore, she does not want to discontinue the propanolol. She will try to increase the amount of water the patient is drinking.

## 2023-10-11 NOTE — Telephone Encounter (Signed)
 Left voicemail with [facility] office for Shannon Higgins to return call to office. No callback number for Shannon Higgins was documented.    3rd attempt

## 2023-10-11 NOTE — Telephone Encounter (Signed)
 Noted

## 2023-10-12 ENCOUNTER — Telehealth: Payer: Self-pay | Admitting: Primary Care

## 2023-10-12 NOTE — Telephone Encounter (Signed)
 Again, I recommend she discontinue her propranolol  medication as this could lead to orthostatic hypotension.  Have her update if her headaches return/increase after she stops the propranolol .   Have her also update if the orthostatic hypotension has improved after discontinuation of propranolol .

## 2023-10-12 NOTE — Telephone Encounter (Signed)
 Called and advised [name] with [facility] [employer]. She will speak with patients family and advise of the recommendations.

## 2023-10-12 NOTE — Telephone Encounter (Signed)
 Copied from CRM [id number]. Topic: Clinical - Home Health Verbal Orders >> Oct 12, 2023  2:46 PM Artemio Larry wrote: Caller/Agency: [name] from [facility]  Callback Number: [phone] Service Requested: Physical Therapy Frequency: Will continue  Any new concerns about the patient? Yes, blood pressure readings - 140/72 and dropped to 118/60 standing. Sitting 124/60 Laying down 124/60. Sitting 132/70

## 2023-10-24 ENCOUNTER — Ambulatory Visit: Admitting: Primary Care

## 2023-10-24 ENCOUNTER — Encounter: Payer: Self-pay | Admitting: Primary Care

## 2023-10-24 VITALS — BP 126/64 | HR 64 | Temp 97.2°F | Ht 62.25 in | Wt 162.0 lb

## 2023-10-24 DIAGNOSIS — Z7984 Long term (current) use of oral hypoglycemic drugs: Secondary | ICD-10-CM | POA: Diagnosis not present

## 2023-10-24 DIAGNOSIS — E1142 Type 2 diabetes mellitus with diabetic polyneuropathy: Secondary | ICD-10-CM | POA: Diagnosis not present

## 2023-10-24 DIAGNOSIS — R102 Pelvic and perineal pain: Secondary | ICD-10-CM | POA: Diagnosis not present

## 2023-10-24 LAB — POCT GLYCOSYLATED HEMOGLOBIN (HGB A1C): Hemoglobin A1C: 8.1 % — AB (ref 4.0–5.6)

## 2023-10-24 LAB — POC URINALSYSI DIPSTICK (AUTOMATED)
Bilirubin, UA: POSITIVE
Blood, UA: NEGATIVE
Glucose, UA: POSITIVE — AB
Nitrite, UA: POSITIVE
Protein, UA: POSITIVE — AB
Spec Grav, UA: 1.025 (ref 1.010–1.025)
Urobilinogen, UA: 1 U/dL
pH, UA: 5.5 (ref 5.0–8.0)

## 2023-10-24 MED ORDER — CEPHALEXIN 500 MG PO CAPS: 500.0000 mg | ORAL_CAPSULE | Freq: Two times a day (BID) | ORAL | 0 refills | Status: DC

## 2023-10-24 NOTE — Assessment & Plan Note (Signed)
 UA today with leukocytes, positive nitrites, blood. Urine culture ordered pending.  Start cephalexin  500 mg twice daily x 7 days.

## 2023-10-24 NOTE — Patient Instructions (Addendum)
 Reduce your intake of sweets and processed carbohydrates.  This would include snack cakes, crackers, cookies, chips, etc.  Increase physical activity.  Start cephalexin  antibiotic for UTI.  Take 1 pill twice daily for 7 days.  Please schedule a follow up visit for 3 months.  It was a pleasure to see you today!

## 2023-10-24 NOTE — Progress Notes (Addendum)
 Subjective:    Patient ID: Shannon Higgins, female    DOB: 19-Sep-1934, 89 y.o.   MRN: [id number]  HPI  Shannon Higgins is a very pleasant 89 y.o. female and significant medical history including hypertension, OSA, GERD, type 2 diabetes, dementia, breast cancer, ovarian cancer, recurrent UTI who presents today for follow-up of diabetes and to discuss pelvic pressure.  Her daughter joins us  today.   1) Type 2 Diabetes: Current medications include: Metformin  ER 1000 mg daily.   She is checking her blood glucose on occasion and is getting readings of: high 100s.   Last A1C: 8.9 in March 2025 Last Eye Exam: Due Last Foot Exam: Up-to-date Pneumonia Vaccination: Completed last in 2020 Urine Microalbumin: Up-to-date Statin: Rosuvastatin   Dietary changes since last visit: She continues to eat a lot sweets daily.    Exercise: None. She spends most of her day laying in bed and watching TV.   BP Readings from Last 3 Encounters:  10/24/23 126/64  09/14/23 132/83  09/13/23 131/70   2) Pelvic Pressure: Acute for the last few days. She questions if she has a UTI.  She denies hematuria, fevers, abdominal pain, urinary frequency, dysuria.  She does have a history of recurrent UTI.   Review of Systems  Constitutional:  Negative for fever.  Respiratory:  Negative for shortness of breath.   Cardiovascular:  Negative for chest pain.  Genitourinary:  Positive for pelvic pain. Negative for dysuria, frequency and hematuria.  Neurological:  Positive for numbness.         Past Medical History:  Diagnosis Date   Acute bronchitis 05/13/2022   Acute metabolic encephalopathy 04/23/2020   Acute tubular injury of transplanted kidney (HCC) 06/14/2016   AKI (acute kidney injury) (HCC) 06/14/2016   Asthma    Breast cancer (HCC) 2015   left   Bronchitis    CAP (community acquired pneumonia) 04/29/2012   Chronic sinusitis    Closed fracture of medial malleolus 05/11/2018   Community acquired  pneumonia 04/29/2012   Confusion 12/06/2016   COVID-19 virus infection 12/17/2020   Diabetes mellitus without complication (HCC)    Family history of breast cancer    GERD (gastroesophageal reflux disease)    History of ankle fracture 05/14/2018   Last Assessment & Plan:  With a recent fall. Right medial malleous. Has ortho follow up soon, splinted now and tylenol  helps her pain   History of recurrent UTIs    Hypertension    Hypokalemia 04/26/2012   Hyponatremia    Hypothyroidism    Imbalance 09/02/2019   Insomnia    Migraine    Neuropathic pain    Orthostatic hypotension 05/08/2018   Ovarian cancer (HCC) 2022   Ovarian mass, left 07/14/2020   Personal history of breast cancer    Personal history of chemotherapy current   bilateral ovarian ca   Pneumonia    Rheumatoid arthritis (HCC)    Sepsis (HCC) 04/26/2012   Sepsis secondary to UTI (HCC) 04/26/2012   Sleep apnea    Stroke [facility]: [facility])    seen in CT scan   Thyroid  disease    Transient hypotension 06/14/2016    Social History   Socioeconomic History   Marital status: Widowed    Spouse name: Not on file   Number of children: 2   Years of education: Not on file   Highest education level: Some college, no degree  Occupational History   Occupation: retired   Tobacco Use   Smoking status: Never  Smokeless tobacco: Never  Vaping Use   Vaping status: Never Used  Substance and Sexual Activity   Alcohol use: No   Drug use: No   Sexual activity: Not Currently    Partners: Male    Birth control/protection: Post-menopausal  Other Topics Concern   Not on file  Social History Narrative   Pt lives in 1 story home with her daughter, Luke and [name]'s husband   Has 2 adult daughters   Highest level of education: some college   Worked mainly as Environmental health practitioner.   Social Drivers of Corporate investment banker Strain: Low Risk  (07/20/2023)   Overall Financial Resource Strain (CARDIA)    Difficulty of Paying Living  Expenses: Not hard at all  Food Insecurity: No Food Insecurity (08/07/2023)   Hunger Vital Sign    Worried About Running Out of Food in the Last Year: Never true    Ran Out of Food in the Last Year: Never true  Transportation Needs: No Transportation Needs (08/07/2023)   PRAPARE - Administrator, Civil Service (Medical): No    Lack of Transportation (Non-Medical): No  Physical Activity: Inactive (07/20/2023)   Exercise Vital Sign    Days of Exercise per Week: 0 days    Minutes of Exercise per Session: 0 min  Stress: No Stress Concern Present (07/20/2023)   [employer] of Occupational Health - Occupational Stress Questionnaire    Feeling of Stress : Not at all  Social Connections: Moderately Isolated (07/20/2023)   Social Connection and Isolation Panel    Frequency of Communication with Friends and Family: Twice a week    Frequency of Social Gatherings with Friends and Family: Three times a week    Attends Religious Services: 1 to 4 times per year    Active Member of Clubs or Organizations: No    Attends Banker Meetings: Never    Marital Status: Widowed  Intimate Partner Violence: Not At Risk (08/07/2023)   Humiliation, Afraid, Rape, and Kick questionnaire    Fear of Current or Ex-Partner: No    Emotionally Abused: No    Physically Abused: No    Sexually Abused: No    Past Surgical History:  Procedure Laterality Date   ABDOMINAL HYSTERECTOMY     BACK SURGERY     BREAST BIOPSY Right ?`   benign   BREAST LUMPECTOMY Left 2015   breast ca   CHOLECYSTECTOMY     JOINT REPLACEMENT     LAPAROTOMY N/A 07/14/2020   Procedure: LAPAROTOMY;  Surgeon: Arloa Lamar SQUIBB, MD;  Location: [facility] ORS;  Service: Gynecology;  Laterality: N/A;   OOPHORECTOMY     OVARIAN CYST REMOVAL     PORTA CATH INSERTION N/A 07/27/2020   Procedure: PORTA CATH INSERTION;  Surgeon: Marea Selinda RAMAN, MD;  Location: [facility];  Service: Cardiovascular;  Laterality: N/A;   PORTA CATH  REMOVAL N/A 04/05/2021   Procedure: PORTA CATH REMOVAL;  Surgeon: Marea Selinda RAMAN, MD;  Location: [facility];  Service: Cardiovascular;  Laterality: N/A;   REPLACEMENT TOTAL KNEE Right    SALPINGOOPHORECTOMY Bilateral 07/14/2020   Procedure: OPEN SALPINGO OOPHORECTOMY;  Surgeon: Arloa Lamar SQUIBB, MD;  Location: [facility];  Service: Gynecology;  Laterality: Bilateral;   TOTAL SHOULDER REPLACEMENT Left     Family History  Problem Relation Age of Onset   Hypertension Mother    Diabetes Daughter    Hypertension Daughter    Fibromyalgia Daughter  GER disease Daughter    Fibromyalgia Daughter    Crohn's disease Daughter    Asthma Daughter    Breast cancer Niece    Uterine cancer Niece    Breast cancer Other    Bladder Cancer Neg Hx    Renal cancer Neg Hx     No Known Allergies  Current Outpatient Medications on File Prior to Visit  Medication Sig Dispense Refill   Accu-Chek FastClix Lancets MISC USE AS INSTRUCTED TO TEST BLOOD SUGAR DAILY 102 each 1   ACCU-CHEK GUIDE test strip 1 EACH BY IN VITRO ROUTE IN THE MORNING, AT NOON, AND AT BEDTIME. MAY SUBSTITUTE TO ANY MANUFACTURER COVERED BY PATIENT'S INSURANCE. 300 strip 0   albuterol  (VENTOLIN  HFA) 108 (90 Base) MCG/ACT inhaler Inhale 1-2 puffs into the lungs every 6 (six) hours as needed. 18 g 0   aspirin  EC 325 MG tablet Take 325 mg by mouth daily.     b complex vitamins capsule Take 1 capsule by mouth daily.     blood glucose meter kit and supplies Dispense based on patient and insurance preference. Use up to 2 times daily as directed. (FOR ICD-10 E10.9, E11.9). 1 each 0   Blood Glucose Monitoring Suppl DEVI 1 each by Does not apply route in the morning, at noon, and at bedtime. May substitute to any manufacturer covered by patient's insurance. 1 each 0   botulinum toxin Type A  (BOTOX ) 200 units injection INJECT 155 UNITS INTRAMUSCULARLY INTO MULTIPLE SITES OF FACE, HEAD, AND NECK EVERY 90 DAYS 1 each 4   Cranberry 1000 MG CAPS  Take 1 capsule by mouth 2 (two) times daily.     donepezil  (ARICEPT ) 10 MG tablet TAKE 1 TABLET BY MOUTH EVERYDAY AT BEDTIME 90 tablet 0   dorzolamide -timolol  (COSOPT ) 22.3-6.8 MG/ML ophthalmic solution Place 1 drop into both eyes 2 (two) times daily.     DULoxetine  (CYMBALTA ) 60 MG capsule Take 1 capsule (60 mg total) by mouth daily. For depression 90 capsule 2   estradiol  (ESTRACE ) 0.1 MG/GM vaginal cream Apply at bedtime, 1g nightly for 2 weeks, followed by 1g 3 times a week 42.5 g 3   fluticasone  (FLONASE ) 50 MCG/ACT nasal spray USE 2 SPRAYS IN EACH NOSTRIL DAILY AS NEEDED FOR ALLERGIES OR RHINITIS 48 g 2   gabapentin  (NEURONTIN ) 600 MG tablet Take 1 tablet (600 mg total) by mouth 2 (two) times daily. For neuropathy 180 tablet 2   Lancets Misc. MISC 1 each by Does not apply route in the morning, at noon, and at bedtime. May substitute to any manufacturer covered by patient's insurance. 300 each 0   latanoprost  (XALATAN ) 0.005 % ophthalmic solution Place 1 drop into both eyes at bedtime.     levothyroxine  (SYNTHROID ) 112 MCG tablet TAKE 1 TABLET EVERY MORNING MONDAY THROUGH SATURDAY; AND 1/2 TABLET ON SUNDAY. TAKE ON AN EMPTY STOMACH WITH WATER ONLY 78 tablet 2   meclizine  (ANTIVERT ) 25 MG tablet Take 1 tablet (25 mg total) by mouth 2 (two) times daily as needed for dizziness. 180 tablet 0   meloxicam  (MOBIC ) 15 MG tablet TAKE 1 TABLET EVERY DAY AS NEEDED FOR PAIN 90 tablet 0   memantine  (NAMENDA ) 5 MG tablet TAKE 1 TABLET BY MOUTH TWICE A DAY 180 tablet 1   metFORMIN  (GLUCOPHAGE -XR) 500 MG 24 hr tablet Take 2 tablets (1,000 mg total) by mouth daily with breakfast. for diabetes. 180 tablet 1   methenamine  (HIPREX ) 1 g tablet Take 1 tablet (1 g total)  by mouth 2 (two) times daily with a meal. 30 tablet 2   mirtazapine  (REMERON ) 15 MG tablet TAKE 1 TABLET (15 MG TOTAL) BY MOUTH AT BEDTIME. FOR SLEEP 90 tablet 2   Netarsudil Dimesylate (RHOPRESSA) 0.02 % SOLN Place 1 drop into both eyes daily at 6  (six) AM.     nystatin  cream (MYCOSTATIN ) Apply 1 Application topically 2 (two) times daily as needed. 30 g 2   omeprazole  (PRILOSEC) 20 MG capsule TAKE 1 CAPSULE TWICE DAILY FOR HEARTBURN. 180 capsule 2   phenazopyridine  (PYRIDIUM ) 200 MG tablet Take 1 tablet (200 mg total) by mouth 3 (three) times daily as needed for pain. 10 tablet 0   pilocarpine  (PILOCAR) 2 % ophthalmic solution Place 1 drop into both eyes 4 (four) times daily.      propranolol  (INDERAL ) 80 MG tablet Take 1 tablet (80 mg total) by mouth daily. For headache prevention 90 tablet 2   rosuvastatin  (CRESTOR ) 10 MG tablet TAKE 1 TABLET EVERY DAY FOR CHOLESTEROL 90 tablet 2   SUMAtriptan  (IMITREX ) 25 MG tablet TAKE 1 TABLET AS DIRECTED EVERY 2 HOURS AS NEEDED FOR MIGRAINE. MAY REPEAT IN 2 HOURS IF HEADACHE PERSISTS OR RECURS. 9 tablet 11   triamcinolone  ointment (KENALOG ) 0.5 % Apply 1 Application topically 2 (two) times daily. 30 g 0   VITAMIN D  PO Take by mouth.     No current facility-administered medications on file prior to visit.    BP 126/64   Pulse 64   Temp (!) 97.2 F (36.2 C) (Temporal)   Ht 5' 2.25 (1.581 m)   Wt 162 lb (73.5 kg)   SpO2 98%   BMI 29.39 kg/m  Objective:   Physical Exam  Cardiovascular:     Rate and Rhythm: Normal rate and regular rhythm.  Pulmonary:     Effort: Pulmonary effort is normal.     Breath sounds: Normal breath sounds.   Musculoskeletal:     Cervical back: Neck supple.   Skin:    General: Skin is warm and dry.   Neurological:     Mental Status: She is alert and oriented to person, place, and time.   Psychiatric:        Mood and Affect: Mood normal.           Assessment & Plan:  Type 2 diabetes mellitus with peripheral neuropathy (HCC) Assessment & Plan: Improved but not yet at goal with A1c of 8.1 today.  We had a long discussion regarding her age and frailty in regards to her medication regimen. She will work on cutting back on sweets.  Continue metformin   ER 1000 mg daily for now.  If glucose numbers continue to remain high then recommend increasing metformin  to 1500 mg daily.  She agrees.  Follow-up in 3 months.  Orders: -     POCT glycosylated hemoglobin (Hb A1C)  Pelvic pressure in female Assessment & Plan: UA today with leukocytes, positive nitrites, blood. Urine culture ordered pending.  Start cephalexin  500 mg twice daily x 7 days.  Orders: -     POCT Urinalysis Dipstick (Automated) -     Cephalexin ; Take 1 capsule (500 mg total) by mouth 2 (two) times daily. for UTI.  Dispense: 14 capsule; Refill: 0 -     Urine Culture        Comer MARLA Gaskins, NP

## 2023-10-24 NOTE — Assessment & Plan Note (Signed)
 Improved but not yet at goal with A1c of 8.1 today.  We had a long discussion regarding her age and frailty in regards to her medication regimen. She will work on cutting back on sweets.  Continue metformin  ER 1000 mg daily for now.  If glucose numbers continue to remain high then recommend increasing metformin  to 1500 mg daily.  She agrees.  Follow-up in 3 months.

## 2023-10-26 ENCOUNTER — Ambulatory Visit: Payer: Self-pay | Admitting: Primary Care

## 2023-10-26 LAB — URINE CULTURE
MICRO NUMBER:: 16618368
SPECIMEN QUALITY:: ADEQUATE

## 2023-11-02 DIAGNOSIS — N390 Urinary tract infection, site not specified: Secondary | ICD-10-CM | POA: Diagnosis not present

## 2023-11-02 DIAGNOSIS — F03918 Unspecified dementia, unspecified severity, with other behavioral disturbance: Secondary | ICD-10-CM | POA: Diagnosis not present

## 2023-11-02 DIAGNOSIS — F331 Major depressive disorder, recurrent, moderate: Secondary | ICD-10-CM | POA: Diagnosis not present

## 2023-11-02 DIAGNOSIS — G4700 Insomnia, unspecified: Secondary | ICD-10-CM | POA: Diagnosis not present

## 2023-11-02 DIAGNOSIS — I10 Essential (primary) hypertension: Secondary | ICD-10-CM | POA: Diagnosis not present

## 2023-11-02 DIAGNOSIS — G4733 Obstructive sleep apnea (adult) (pediatric): Secondary | ICD-10-CM | POA: Diagnosis not present

## 2023-11-02 DIAGNOSIS — J329 Chronic sinusitis, unspecified: Secondary | ICD-10-CM | POA: Diagnosis not present

## 2023-11-02 DIAGNOSIS — J45909 Unspecified asthma, uncomplicated: Secondary | ICD-10-CM | POA: Diagnosis not present

## 2023-11-02 DIAGNOSIS — F0393 Unspecified dementia, unspecified severity, with mood disturbance: Secondary | ICD-10-CM | POA: Diagnosis not present

## 2023-11-02 DIAGNOSIS — E1142 Type 2 diabetes mellitus with diabetic polyneuropathy: Secondary | ICD-10-CM | POA: Diagnosis not present

## 2023-11-02 DIAGNOSIS — G43909 Migraine, unspecified, not intractable, without status migrainosus: Secondary | ICD-10-CM | POA: Diagnosis not present

## 2023-11-02 DIAGNOSIS — I951 Orthostatic hypotension: Secondary | ICD-10-CM | POA: Diagnosis not present

## 2023-11-07 DIAGNOSIS — H401133 Primary open-angle glaucoma, bilateral, severe stage: Secondary | ICD-10-CM | POA: Diagnosis not present

## 2023-11-07 DIAGNOSIS — Z961 Presence of intraocular lens: Secondary | ICD-10-CM | POA: Diagnosis not present

## 2023-11-09 ENCOUNTER — Other Ambulatory Visit: Payer: Self-pay | Admitting: Neurology

## 2023-11-09 DIAGNOSIS — F03A0 Unspecified dementia, mild, without behavioral disturbance, psychotic disturbance, mood disturbance, and anxiety: Secondary | ICD-10-CM

## 2023-11-21 ENCOUNTER — Telehealth: Payer: Self-pay | Admitting: Primary Care

## 2023-11-21 NOTE — Telephone Encounter (Signed)
 FYI only:  This RN spoke to the daughter of patient who states that her mother's UTI symptoms improved, but have not resolved on antibiotics.   She reports continued vaginal pain, foul smelling urine, and sleepiness.   Appointment offered and accepted for 11/23/23 with LOIS Gaskins, NP  Will proceed to [facility] or ED for any worsening symptoms or contact office for triage

## 2023-11-21 NOTE — Telephone Encounter (Signed)
 Noted, will evaluate.

## 2023-11-21 NOTE — Telephone Encounter (Signed)
 Copied from CRM [id number]. Topic: Clinical - Medication Refill >> Nov 21, 2023  9:34 AM Carlyon D wrote: Medication:  cephALEXin  (KEFLEX ) 500 MG capsule    Has the patient contacted their pharmacy? No (Agent: If no, request that the patient contact the pharmacy for the refill. If patient does not wish to contact the pharmacy document the reason why and proceed with request.) (Agent: If yes, when and what did the pharmacy advise?)  This is the patient's preferred pharmacy:  [phone] [place] - [address] [place] [address] Phone: [phone] Fax: [phone]   Is this the correct pharmacy for this prescription? Yes If no, delete pharmacy and type the correct one.   Has the prescription been filled recently? Yes  Is the patient out of the medication? Yes  Has the patient been seen for an appointment in the last year OR does the patient have an upcoming appointment? Yes  Can we respond through MyChart? Yes  Agent: Please be advised that Rx refills may take up to 3 business days. We ask that you follow-up with your pharmacy.

## 2023-11-23 ENCOUNTER — Ambulatory Visit (INDEPENDENT_AMBULATORY_CARE_PROVIDER_SITE_OTHER): Admitting: Primary Care

## 2023-11-23 VITALS — BP 142/94 | HR 83 | Temp 97.3°F | Ht 62.25 in | Wt 163.0 lb

## 2023-11-23 DIAGNOSIS — E1142 Type 2 diabetes mellitus with diabetic polyneuropathy: Secondary | ICD-10-CM

## 2023-11-23 DIAGNOSIS — R102 Pelvic and perineal pain: Secondary | ICD-10-CM

## 2023-11-23 LAB — POC URINALSYSI DIPSTICK (AUTOMATED)
Bilirubin, UA: POSITIVE
Blood, UA: NEGATIVE
Glucose, UA: NEGATIVE
Ketones, UA: POSITIVE
Nitrite, UA: NEGATIVE
Protein, UA: POSITIVE — AB
Spec Grav, UA: 1.025 (ref 1.010–1.025)
Urobilinogen, UA: 0.2 U/dL
pH, UA: 5 (ref 5.0–8.0)

## 2023-11-23 MED ORDER — BLOOD GLUCOSE TEST VI STRP: 1.0000 | ORAL_STRIP | Freq: Three times a day (TID) | 5 refills | Status: AC

## 2023-11-23 MED ORDER — LANCETS MISC. MISC: 1.0000 | Freq: Three times a day (TID) | 5 refills | Status: AC

## 2023-11-23 MED ORDER — LANCET DEVICE MISC
1.0000 | Freq: Three times a day (TID) | 0 refills | Status: AC
Start: 2023-11-23 — End: 2023-12-23

## 2023-11-23 MED ORDER — BLOOD GLUCOSE MONITORING SUPPL DEVI: 1.0000 | Freq: Three times a day (TID) | 0 refills | Status: AC

## 2023-11-23 NOTE — Progress Notes (Signed)
 Subjective:    Patient ID: Shannon Higgins, female    DOB: 1934-11-16, 89 y.o.   MRN: [id number]  HPI  Shannon Higgins is a very pleasant 89 y.o. female with a history of hypertension, OSA, type 2 diabetes, ovarian cancer, dementia, recurrent UTI, chronic back pain, recurrent falls, urinary incontinence who presents today because pelvic pressure.  Her daughter joins us  today.  She is also needing a new glucometer kit.  She was last evaluated on 10/24/2023 for follow-up of diabetes and acute pelvic pressure.  Urinalysis revealed suspicion for cystitis, urine culture returned E. coli positive with 50,000-100,000 colonies without resistance.  She was treated with cephalexin  5 mg twice daily x 7 days.  Today she discusses a 2 to 3-day history of pelvic pressure and foul-smelling urine.  She questions if her UTI has returned. She's not sure if her UTI from last month completely resolved.   She denies fevers, hematuria, abdominal pain, flank pain, vaginal itching, vaginal discharge. She is managed on Hiprex  1 g BID which she began taking in mid May 2025  per URO/GYN. Her daughter suspects her newer symptoms could be from a large bowel movement she had in her depends recently.     Review of Systems  Gastrointestinal:  Negative for abdominal pain.  Genitourinary:  Positive for pelvic pain. Negative for dysuria, flank pain, hematuria, vaginal bleeding and vaginal discharge.       Foul-smelling urine         Past Medical History:  Diagnosis Date   Acute bronchitis 05/13/2022   Acute metabolic encephalopathy 04/23/2020   Acute tubular injury of transplanted kidney (HCC) 06/14/2016   AKI (acute kidney injury) (HCC) 06/14/2016   Asthma    Breast cancer (HCC) 2015   left   Bronchitis    CAP (community acquired pneumonia) 04/29/2012   Chronic sinusitis    Closed fracture of medial malleolus 05/11/2018   Community acquired pneumonia 04/29/2012   Confusion 12/06/2016   COVID-19 virus  infection 12/17/2020   Diabetes mellitus without complication (HCC)    Family history of breast cancer    GERD (gastroesophageal reflux disease)    History of ankle fracture 05/14/2018   Last Assessment & Plan:  With a recent fall. Right medial malleous. Has ortho follow up soon, splinted now and tylenol  helps her pain   History of recurrent UTIs    Hypertension    Hypokalemia 04/26/2012   Hyponatremia    Hypothyroidism    Imbalance 09/02/2019   Insomnia    Migraine    Neuropathic pain    Orthostatic hypotension 05/08/2018   Ovarian cancer (HCC) 2022   Ovarian mass, left 07/14/2020   Personal history of breast cancer    Personal history of chemotherapy current   bilateral ovarian ca   Pneumonia    Rheumatoid arthritis (HCC)    Sepsis (HCC) 04/26/2012   Sepsis secondary to UTI (HCC) 04/26/2012   Sleep apnea    Stroke [facility])    seen in CT scan   Thyroid  disease    Transient hypotension 06/14/2016    Social History   Socioeconomic History   Marital status: Widowed    Spouse name: Not on file   Number of children: 2   Years of education: Not on file   Highest education level: Some college, no degree  Occupational History   Occupation: retired   Tobacco Use   Smoking status: Never   Smokeless tobacco: Never  Vaping Use   Vaping status:  Never Used  Substance and Sexual Activity   Alcohol use: No   Drug use: No   Sexual activity: Not Currently    Partners: Male    Birth control/protection: Post-menopausal  Other Topics Concern   Not on file  Social History Narrative   Pt lives in 1 story home with her daughter, Luke and [name]'s husband   Has 2 adult daughters   Highest level of education: some college   Worked mainly as Environmental health practitioner.   Social Drivers of Corporate investment banker Strain: Low Risk  (11/23/2023)   Overall Financial Resource Strain (CARDIA)    Difficulty of Paying Living Expenses: Not hard at all  Food Insecurity: No Food Insecurity  (11/23/2023)   Hunger Vital Sign    Worried About Running Out of Food in the Last Year: Never true    Ran Out of Food in the Last Year: Never true  Transportation Needs: No Transportation Needs (11/23/2023)   PRAPARE - Administrator, Civil Service (Medical): No    Lack of Transportation (Non-Medical): No  Physical Activity: Inactive (11/23/2023)   Exercise Vital Sign    Days of Exercise per Week: 0 days    Minutes of Exercise per Session: Not on file  Stress: No Stress Concern Present (11/23/2023)   [employer] of Occupational Health - Occupational Stress Questionnaire    Feeling of Stress: Not at all  Social Connections: Socially Isolated (11/23/2023)   Social Connection and Isolation Panel    Frequency of Communication with Friends and Family: More than three times a week    Frequency of Social Gatherings with Friends and Family: Once a week    Attends Religious Services: Never    Database administrator or Organizations: No    Attends Engineer, structural: Not on file    Marital Status: Widowed  Intimate Partner Violence: Not At Risk (08/07/2023)   Humiliation, Afraid, Rape, and Kick questionnaire    Fear of Current or Ex-Partner: No    Emotionally Abused: No    Physically Abused: No    Sexually Abused: No    Past Surgical History:  Procedure Laterality Date   ABDOMINAL HYSTERECTOMY     BACK SURGERY     BREAST BIOPSY Right ?`   benign   BREAST LUMPECTOMY Left 2015   breast ca   CHOLECYSTECTOMY     JOINT REPLACEMENT     LAPAROTOMY N/A 07/14/2020   Procedure: LAPAROTOMY;  Surgeon: Arloa Lamar SQUIBB, MD;  Location: [facility];  Service: Gynecology;  Laterality: N/A;   OOPHORECTOMY     OVARIAN CYST REMOVAL     PORTA CATH INSERTION N/A 07/27/2020   Procedure: PORTA CATH INSERTION;  Surgeon: Marea Selinda RAMAN, MD;  Location: [facility];  Service: Cardiovascular;  Laterality: N/A;   PORTA CATH REMOVAL N/A 04/05/2021   Procedure: PORTA CATH REMOVAL;   Surgeon: Marea Selinda RAMAN, MD;  Location: [facility];  Service: Cardiovascular;  Laterality: N/A;   REPLACEMENT TOTAL KNEE Right    SALPINGOOPHORECTOMY Bilateral 07/14/2020   Procedure: OPEN SALPINGO OOPHORECTOMY;  Surgeon: Arloa Lamar SQUIBB, MD;  Location: [facility];  Service: Gynecology;  Laterality: Bilateral;   TOTAL SHOULDER REPLACEMENT Left     Family History  Problem Relation Age of Onset   Hypertension Mother    Diabetes Daughter    Hypertension Daughter    Fibromyalgia Daughter    GER disease Daughter    Fibromyalgia Daughter  Crohn's disease Daughter    Asthma Daughter    Breast cancer Niece    Uterine cancer Niece    Breast cancer Other    Bladder Cancer Neg Hx    Renal cancer Neg Hx     No Known Allergies  Current Outpatient Medications on File Prior to Visit  Medication Sig Dispense Refill   Accu-Chek FastClix Lancets MISC USE AS INSTRUCTED TO TEST BLOOD SUGAR DAILY 102 each 1   albuterol  (VENTOLIN  HFA) 108 (90 Base) MCG/ACT inhaler Inhale 1-2 puffs into the lungs every 6 (six) hours as needed. 18 g 0   aspirin  EC 325 MG tablet Take 325 mg by mouth daily.     b complex vitamins capsule Take 1 capsule by mouth daily.     blood glucose meter kit and supplies Dispense based on patient and insurance preference. Use up to 2 times daily as directed. (FOR ICD-10 E10.9, E11.9). 1 each 0   Cranberry 1000 MG CAPS Take 1 capsule by mouth 2 (two) times daily.     donepezil  (ARICEPT ) 10 MG tablet TAKE 1 TABLET BY MOUTH EVERYDAY AT BEDTIME 90 tablet 0   dorzolamide -timolol  (COSOPT ) 22.3-6.8 MG/ML ophthalmic solution Place 1 drop into both eyes 2 (two) times daily.     DULoxetine  (CYMBALTA ) 60 MG capsule Take 1 capsule (60 mg total) by mouth daily. For depression 90 capsule 2   estradiol  (ESTRACE ) 0.1 MG/GM vaginal cream Apply at bedtime, 1g nightly for 2 weeks, followed by 1g 3 times a week 42.5 g 3   fluticasone  (FLONASE ) 50 MCG/ACT nasal spray USE 2 SPRAYS IN EACH NOSTRIL  DAILY AS NEEDED FOR ALLERGIES OR RHINITIS 48 g 2   gabapentin  (NEURONTIN ) 600 MG tablet Take 1 tablet (600 mg total) by mouth 2 (two) times daily. For neuropathy 180 tablet 2   latanoprost  (XALATAN ) 0.005 % ophthalmic solution Place 1 drop into both eyes at bedtime.     levothyroxine  (SYNTHROID ) 112 MCG tablet TAKE 1 TABLET EVERY MORNING MONDAY THROUGH SATURDAY; AND 1/2 TABLET ON SUNDAY. TAKE ON AN EMPTY STOMACH WITH WATER ONLY 78 tablet 2   meclizine  (ANTIVERT ) 25 MG tablet Take 1 tablet (25 mg total) by mouth 2 (two) times daily as needed for dizziness. 180 tablet 0   meloxicam  (MOBIC ) 15 MG tablet TAKE 1 TABLET EVERY DAY AS NEEDED FOR PAIN 90 tablet 0   memantine  (NAMENDA ) 5 MG tablet TAKE 1 TABLET BY MOUTH TWICE A DAY 180 tablet 1   metFORMIN  (GLUCOPHAGE -XR) 500 MG 24 hr tablet Take 2 tablets (1,000 mg total) by mouth daily with breakfast. for diabetes. 180 tablet 1   methenamine  (HIPREX ) 1 g tablet Take 1 tablet (1 g total) by mouth 2 (two) times daily with a meal. 60 tablet 2   mirtazapine  (REMERON ) 15 MG tablet TAKE 1 TABLET (15 MG TOTAL) BY MOUTH AT BEDTIME. FOR SLEEP 90 tablet 2   Netarsudil Dimesylate (RHOPRESSA) 0.02 % SOLN Place 1 drop into both eyes daily at 6 (six) AM.     nystatin  cream (MYCOSTATIN ) Apply 1 Application topically 2 (two) times daily as needed. 30 g 2   omeprazole  (PRILOSEC) 20 MG capsule TAKE 1 CAPSULE TWICE DAILY FOR HEARTBURN. 180 capsule 2   phenazopyridine  (PYRIDIUM ) 200 MG tablet Take 1 tablet (200 mg total) by mouth 3 (three) times daily as needed for pain. 10 tablet 0   pilocarpine  (PILOCAR) 2 % ophthalmic solution Place 1 drop into both eyes 4 (four) times daily.  propranolol  (INDERAL ) 80 MG tablet Take 1 tablet (80 mg total) by mouth daily. For headache prevention 90 tablet 2   rosuvastatin  (CRESTOR ) 10 MG tablet TAKE 1 TABLET EVERY DAY FOR CHOLESTEROL 90 tablet 2   SUMAtriptan  (IMITREX ) 25 MG tablet TAKE 1 TABLET AS DIRECTED EVERY 2 HOURS AS NEEDED FOR  MIGRAINE. MAY REPEAT IN 2 HOURS IF HEADACHE PERSISTS OR RECURS. 9 tablet 11   triamcinolone  ointment (KENALOG ) 0.5 % Apply 1 Application topically 2 (two) times daily. 30 g 0   VITAMIN D  PO Take by mouth.     botulinum toxin Type A  (BOTOX ) 200 units injection INJECT 155 UNITS INTRAMUSCULARLY INTO MULTIPLE SITES OF FACE, HEAD, AND NECK EVERY 90 DAYS (Patient not taking: Reported on 11/23/2023) 1 each 4   cephALEXin  (KEFLEX ) 500 MG capsule Take 1 capsule (500 mg total) by mouth 2 (two) times daily. for UTI. 14 capsule 0   No current facility-administered medications on file prior to visit.    BP (!) 142/94   Pulse 83   Temp (!) 97.3 F (36.3 C) (Temporal)   Ht 5' 2.25 (1.581 m)   Wt 163 lb (73.9 kg)   SpO2 95%   BMI 29.57 kg/m  Objective:   Physical Exam Cardiovascular:     Rate and Rhythm: Normal rate and regular rhythm.  Pulmonary:     Effort: Pulmonary effort is normal.     Breath sounds: Normal breath sounds.  Musculoskeletal:     Cervical back: Neck supple.  Skin:    General: Skin is warm and dry.  Neurological:     Mental Status: She is alert and oriented to person, place, and time.  Psychiatric:        Mood and Affect: Mood normal.           Assessment & Plan:  Pelvic pressure in female Assessment & Plan: UA today with trace leuks, 2+ protein, 1+ bilirubin, otherwise negative. Urine culture ordered and pending.  Await results.  Continue Hiprex  1 g twice daily per uro-GYN.  Consider pelvic ultrasound if UA is negative.  Orders: -     POCT Urinalysis Dipstick (Automated) -     Urine Culture  Type 2 diabetes mellitus with peripheral neuropathy (HCC) -     Blood Glucose Monitoring Suppl; 1 each by Does not apply route in the morning, at noon, and at bedtime. May substitute to any manufacturer covered by patient's insurance.  Dispense: 1 each; Refill: 0 -     Blood Glucose Test; 1 each by In Vitro route in the morning, at noon, and at bedtime. May substitute  to any manufacturer covered by patient's insurance.  Dispense: 100 strip; Refill: 5 -     Lancet Device; 1 each by Does not apply route in the morning, at noon, and at bedtime. May substitute to any manufacturer covered by patient's insurance.  Dispense: 1 each; Refill: 0 -     Lancets Misc.; 1 each by Does not apply route in the morning, at noon, and at bedtime. May substitute to any manufacturer covered by patient's insurance.  Dispense: 100 each; Refill: 5        Comer MARLA Gaskins, NP

## 2023-11-23 NOTE — Patient Instructions (Signed)
 We will be in touch in a few days regarding your urine sample.  Please notify us  if your symptoms become worse.  It was a pleasure to see you today!

## 2023-11-23 NOTE — Assessment & Plan Note (Addendum)
 UA today with trace leuks, 2+ protein, 1+ bilirubin, otherwise negative. Urine culture ordered and pending.  Await results.  Continue Hiprex  1 g twice daily per uro-GYN.  Consider pelvic ultrasound if UA is negative.

## 2023-11-25 LAB — URINE CULTURE
MICRO NUMBER:: 16741368
SPECIMEN QUALITY:: ADEQUATE

## 2023-11-26 ENCOUNTER — Ambulatory Visit: Payer: Self-pay | Admitting: Primary Care

## 2023-11-29 ENCOUNTER — Other Ambulatory Visit: Payer: Self-pay | Admitting: Primary Care

## 2023-11-29 DIAGNOSIS — G8929 Other chronic pain: Secondary | ICD-10-CM

## 2023-12-09 ENCOUNTER — Encounter: Payer: Self-pay | Admitting: Neurology

## 2023-12-09 DIAGNOSIS — G8929 Other chronic pain: Secondary | ICD-10-CM

## 2023-12-09 DIAGNOSIS — F03A0 Unspecified dementia, mild, without behavioral disturbance, psychotic disturbance, mood disturbance, and anxiety: Secondary | ICD-10-CM

## 2023-12-09 DIAGNOSIS — E1142 Type 2 diabetes mellitus with diabetic polyneuropathy: Secondary | ICD-10-CM

## 2023-12-09 DIAGNOSIS — M545 Low back pain, unspecified: Secondary | ICD-10-CM

## 2023-12-09 DIAGNOSIS — G4700 Insomnia, unspecified: Secondary | ICD-10-CM

## 2023-12-10 MED ORDER — MIRTAZAPINE 15 MG PO TABS: 15.0000 mg | ORAL_TABLET | Freq: Every day | ORAL | 1 refills | Status: AC

## 2023-12-10 MED ORDER — DONEPEZIL HCL 10 MG PO TABS: ORAL_TABLET | ORAL | 3 refills | Status: DC

## 2023-12-10 MED ORDER — DICLOFENAC SODIUM 1 % EX GEL: 2.0000 g | Freq: Three times a day (TID) | CUTANEOUS | 2 refills | Status: AC | PRN

## 2023-12-10 MED ORDER — MEMANTINE HCL 5 MG PO TABS: 5.0000 mg | ORAL_TABLET | Freq: Two times a day (BID) | ORAL | 3 refills | Status: DC

## 2023-12-10 MED ORDER — METFORMIN HCL ER 500 MG PO TB24: 1000.0000 mg | ORAL_TABLET | Freq: Every day | ORAL | 1 refills | Status: DC

## 2023-12-22 ENCOUNTER — Encounter: Payer: Self-pay | Admitting: Obstetrics

## 2023-12-22 ENCOUNTER — Ambulatory Visit: Admitting: Obstetrics

## 2023-12-22 DIAGNOSIS — N390 Urinary tract infection, site not specified: Secondary | ICD-10-CM | POA: Diagnosis not present

## 2023-12-22 DIAGNOSIS — Z87440 Personal history of urinary (tract) infections: Secondary | ICD-10-CM

## 2023-12-22 MED ORDER — METHENAMINE HIPPURATE 1 G PO TABS: 1.0000 g | ORAL_TABLET | Freq: Two times a day (BID) | ORAL | 2 refills | Status: AC

## 2023-12-22 MED ORDER — ESTRADIOL 0.1 MG/GM VA CREA: TOPICAL_CREAM | VAGINAL | 3 refills | Status: AC

## 2023-12-22 MED ORDER — VITAMIN C 100 MG PO TABS: 100.0000 mg | ORAL_TABLET | Freq: Every day | ORAL | 2 refills | Status: AC

## 2023-12-22 NOTE — Patient Instructions (Addendum)
 1) Resume vaginal estrogen 1g (first tip of your finger or with applicator) three times a week on Monday, Wednesday and Friday.   2) Resume methenamine  1g twice a day with meals with Vitamin C  for 3 months.  Try to increase you fluid intake

## 2023-12-22 NOTE — Assessment & Plan Note (Signed)
-   06/15/23 catheterized UA + protein, ketones.  - prior self start bactrim  with improvement of dysuria, denies urine testing however reports home test with + leuk - prefer catheterized urine due to fecal incontinence with risk of contamination - reviewed diagnosis requires UTI symptoms and culture. Prior hospitalization due to Flu A and low suspicion for UTI - For treatment of recurrent urinary tract infections, we discussed management of recurrent UTIs including prophylaxis with a daily low dose antibiotic, transvaginal estrogen therapy, D-mannose, and cranberry supplements.  We discussed the role of diagnostic testing such as cystoscopy and upper tract imaging.   - continue cranberry extract, encouraged probiotics - encouraged to resume vaginal estrogen and simplified instructions to 1g 3x/week to help improve compliance. Encouraged pt to turn on her phone for daughter to call and remind her. Rx estring  cost prohibitive  - discussed need to monitor glycemic control due to onset of increased UTI and increased HbA1C. Last 8.1 in 10/24/23 - Cr 1.35 on 06/09/23 stable at 1.02 on 09/13/23 - restart methenamine  for UTI suppression until 3 months of vaginal estrogen use  - discussed importance of patient to return for urine testing when she experiences UTI symptoms to r/o LUTS. Encouraged Ibuprofen  and pyridium  as needed for UTI symptoms to avoid antibiotic exposure and resistant uropathogens.

## 2023-12-22 NOTE — Progress Notes (Signed)
 [facility] Return Visit  SUBJECTIVE  History of Present Illness: DE LIBMAN is a 89 y.o. female seen in follow-up for recurrent UTI, stress urinary incontinence, fecal incontinence, pelvic pressure, and decreased mobility. Plan at last visit was vaginal estrogen, Hipprex, probiotics, timed voids, and bedside commode.   Last treated for presumed UTI due to dysuria, suprapubic pressure by PCP 11/23/23, UA + leuk/protein/ketones/bilirubin. Rx Keflex  with resolution of symptoms. Culture < 10K CFU.  Previously Hipprex 1g twice daily, stopped after Rx completed PRN pyridium  use HbA1C 8.1 in 10/24/23 Denies UTI symptoms today  Reports difficulty with remembering to use vaginal estrogen, daughter tries to call and pt does not turn on her phone for reminder Deferred PFPT for pelvic pressure due to PT for ADLs Denies FI. Drinks 16oz of water and 16oz def tea/day   Past Medical History: Patient  has a past medical history of Acute bronchitis (05/13/2022), Acute metabolic encephalopathy (04/23/2020), Acute tubular injury of transplanted kidney (HCC) (06/14/2016), AKI (acute kidney injury) (HCC) (06/14/2016), Asthma, Breast cancer (HCC) (2015), Bronchitis, CAP (community acquired pneumonia) (04/29/2012), Chronic sinusitis, Closed fracture of medial malleolus (05/11/2018), Community acquired pneumonia (04/29/2012), Confusion (12/06/2016), COVID-19 virus infection (12/17/2020), Diabetes mellitus without complication (HCC), Family history of breast cancer, GERD (gastroesophageal reflux disease), History of ankle fracture (05/14/2018), History of recurrent UTIs, Hypertension, Hypokalemia (04/26/2012), Hyponatremia, Hypothyroidism, Imbalance (09/02/2019), Insomnia, Migraine, Neuropathic pain, Orthostatic hypotension (05/08/2018), Ovarian cancer (HCC) (2022), Ovarian mass, left (07/14/2020), Personal history of breast cancer, Personal history of chemotherapy (current), Pneumonia, Rheumatoid  arthritis (HCC), Sepsis (HCC) (04/26/2012), Sepsis secondary to UTI (HCC) (04/26/2012), Sleep apnea, Stroke (HCC), Thyroid  disease, and Transient hypotension (06/14/2016).   Past Surgical History: She  has a past surgical history that includes Cholecystectomy; Abdominal hysterectomy; Total shoulder replacement (Left); Replacement total knee (Right); Back surgery; Joint replacement; Salpingoophorectomy (Bilateral, 07/14/2020); laparotomy (N/A, 07/14/2020); Ovarian cyst removal; Oophorectomy; PORTA CATH INSERTION (N/A, 07/27/2020); Breast biopsy (Right, ?`); Breast lumpectomy (Left, 2015); and PORTA CATH REMOVAL (N/A, 04/05/2021).   Medications: She has a current medication list which includes the following prescription(s): accu-chek fastclix lancets, albuterol , vitamin c , aspirin  ec, b complex vitamins, blood glucose meter kit and supplies, blood glucose monitoring suppl, botox , cranberry, diclofenac  sodium, donepezil , dorzolamide -timolol , duloxetine , fluticasone , gabapentin , blood glucose test strips, lancet device, lancets misc., latanoprost , levothyroxine , meclizine , meloxicam , memantine , metformin , mirtazapine , rhopressa, nystatin  cream, omeprazole , phenazopyridine , pilocarpine , propranolol , rosuvastatin , sumatriptan , triamcinolone  ointment, vitamin d , estradiol , and methenamine .   Allergies: Patient has no known allergies.   Social History: Patient  reports that she has never smoked. She has never used smokeless tobacco. She reports that she does not drink alcohol and does not use drugs.     OBJECTIVE     Physical Exam: Vitals:   12/22/23 1531  BP: 119/79  Pulse: 66    Gen: No apparent distress, A&O x 3.  Detailed Urogynecologic Evaluation:  Deferred.   Lab Results  Component Value Date   CREATININE 1.02 (H) 09/13/2023   CREATININE 0.82 06/28/2023   CREATININE 1.35 (H) 06/09/2023   Last hemoglobin A1c Lab Results  Component Value Date   HGBA1C 8.1 (A) 10/24/2023         ASSESSMENT AND PLAN    Shannon Higgins is a 89 y.o. with:  1. Recurrent UTI      Recurrent UTI Assessment & Plan: - 06/15/23 catheterized UA + protein, ketones.  - prior self start bactrim  with improvement of dysuria, denies urine testing however reports home test with + leuk - prefer  catheterized urine due to fecal incontinence with risk of contamination - reviewed diagnosis requires UTI symptoms and culture. Prior hospitalization due to Flu A and low suspicion for UTI - For treatment of recurrent urinary tract infections, we discussed management of recurrent UTIs including prophylaxis with a daily low dose antibiotic, transvaginal estrogen therapy, D-mannose, and cranberry supplements.  We discussed the role of diagnostic testing such as cystoscopy and upper tract imaging.   - continue cranberry extract, encouraged probiotics - encouraged to resume vaginal estrogen and simplified instructions to 1g 3x/week to help improve compliance. Encouraged pt to turn on her phone for daughter to call and remind her. Rx estring  cost prohibitive  - discussed need to monitor glycemic control due to onset of increased UTI and increased HbA1C. Last 8.1 in 10/24/23 - Cr 1.35 on 06/09/23 stable at 1.02 on 09/13/23 - restart methenamine  for UTI suppression until 3 months of vaginal estrogen use  - discussed importance of patient to return for urine testing when she experiences UTI symptoms to r/o LUTS. Encouraged Ibuprofen  and pyridium  as needed for UTI symptoms to avoid antibiotic exposure and resistant uropathogens.  Orders: -     Methenamine  Hippurate; Take 1 tablet (1 g total) by mouth 2 (two) times daily with a meal.  Dispense: 60 tablet; Refill: 2 -     Estradiol ; Apply at bedtime, 1g 3 times a week on Monday, Wednesday, Friday.  Dispense: 42.5 g; Refill: 3 -     Vitamin C ; Take 1 tablet (100 mg total) by mouth daily.  Dispense: 60 tablet; Refill: 2   Lianne ONEIDA Gillis, MD

## 2023-12-27 ENCOUNTER — Inpatient Hospital Stay: Payer: Medicare Other

## 2023-12-27 ENCOUNTER — Inpatient Hospital Stay: Payer: Medicare Other | Attending: Primary Care

## 2024-01-05 ENCOUNTER — Other Ambulatory Visit: Payer: Self-pay | Admitting: Primary Care

## 2024-01-05 DIAGNOSIS — G8929 Other chronic pain: Secondary | ICD-10-CM

## 2024-01-18 ENCOUNTER — Other Ambulatory Visit: Payer: Self-pay | Admitting: Primary Care

## 2024-01-18 DIAGNOSIS — E1142 Type 2 diabetes mellitus with diabetic polyneuropathy: Secondary | ICD-10-CM

## 2024-01-19 ENCOUNTER — Ambulatory Visit: Payer: Medicare HMO | Admitting: Neurology

## 2024-01-19 ENCOUNTER — Encounter: Payer: Self-pay | Admitting: Neurology

## 2024-01-19 VITALS — BP 114/68 | HR 78 | Ht 64.0 in | Wt 160.0 lb

## 2024-01-19 DIAGNOSIS — F03A0 Unspecified dementia, mild, without behavioral disturbance, psychotic disturbance, mood disturbance, and anxiety: Secondary | ICD-10-CM

## 2024-01-19 DIAGNOSIS — G43709 Chronic migraine without aura, not intractable, without status migrainosus: Secondary | ICD-10-CM

## 2024-01-19 MED ORDER — MEMANTINE HCL 5 MG PO TABS: 5.0000 mg | ORAL_TABLET | Freq: Two times a day (BID) | ORAL | 4 refills | Status: AC

## 2024-01-19 MED ORDER — DONEPEZIL HCL 10 MG PO TABS: ORAL_TABLET | ORAL | 4 refills | Status: AC

## 2024-01-19 NOTE — Progress Notes (Signed)
 NEUROLOGY FOLLOW UP OFFICE NOTE  Shannon Higgins [id number] 06-01-1934  HISTORY OF PRESENT ILLNESS: I had the pleasure of seeing Shannon Higgins in follow-up in the neurology clinic on 01/19/2024.  The patient was last seen a year ago for migraines and mild dementia. She is again accompanied by her daughter who helps supplement the history today.  Records and images were personally reviewed where available.  They both report memory is pretty good. [name] manages her medications and finances. She does not drive. She is independent with dressing and bathing, Luke is on standby. No personality changes, paranoia or hallucinations. She is on Donepezil  10mg  daily and Memantine  5mg  BID without side effects. Sleep is okay. Due to insurance overage issues, she has been unable to continue Botox  for migraine prophylaxis. Last Botox  session is October 2024. Thankfully, she has not been having a lot of migraines recently. It has been a couple of months since last Imitrex  use. She feels dizzy today when standing. She had one fall in the past year, no injuries. Luke reports she lays in bed all day. She has numbness in both feet. Sometimes her arms ache, right more than left. She has neck pain. She has a walker but does not use it all the time, she stumbles and dances. She is on Gabapentin  600mg  BID for neuropathy. Appetite is okay, Luke reports she eats a lot of carbs.     History on Initial Assessment 02/08/2017: This is an 89 year old right-handed woman with a history of hypertension, diabetes, rheumatoid arthritis, breast cancer, presenting for evaluation of chronic headaches and confusion.    1. Headaches Headaches started in her 30s. She went for a period of time with no significant headaches for a year, then last June headaches recurred lasting for weeks at a time. She has 20 to 25 headache days a month, with nausea, upset stomach, sometimes diarrhea. She describes a lot of pressure and throbbing. She had a headache  that lasted for 2 months, resolving 2 weeks ago, then this weekend headaches started back and have been ongoing for 4-5 days. She denies any visual obscurations, no photo/phonophobia. She does not usually have dizziness with the headaches, but recently has been so dizzy she had difficulty getting to the bathroom the past week, with described spinning sensation. Meclizine  helps some. She has had vertigo in the past. No family history of migraines. She has tried several headache preventative medications, including amitriptyline, Topiramate. She is taking Propranolol  80mg  daily with no side effects. She is also on Neurontin  for back pain and Cymbalta  for mood.    2. Confusion. She reports word-finding difficulties for the past 6 months. Her daughter mostly describes the episodic confusion as forgetting what she had previously told the patient. She has been taking an old meclizine  prescription, but forgot that her daughter told her this information. She has not been driving for the past month due to glaucoma, denied previously getting lost driving. After her husband passed away 1.5 years ago, she moved in with her daughter and son-in-law. They are in charge of finances. Her daughter states she forgets her medications. One time she forgot to take her Cymbalta  for a week. No paranoia or hallucinations. She used to be a social butterfly but now likes to be more at home. There is a strong family history of dementia in her mother and multiple siblings.    She denies any diplopia, dysarthria/dysphagia, anosmia, or tremors. She has chronic neck and back pain and neuropathy  in both feet radiating up her legs. She has a little urinary incontinence, no bowel issues. They report several falls with the dizziness. After she hit her head with fall in July, she had a bad headache for 6 weeks. She was veering to the left and went to the ER on 7/13 where head CT did not show any acute changes. She got a migraine cocktail but  headache was still bad the next day.   MRI/MRA brain without contrast 08/2018: 1. No acute intracranial abnormality. 2. Chronic ischemic microangiopathy. 3. Multifocal severe stenosis and/or short segment occlusion of the right posterior cerebral artery. Otherwise normal intracranial MRA.  PAST MEDICAL HISTORY: Past Medical History:  Diagnosis Date   Acute bronchitis 05/13/2022   Acute metabolic encephalopathy 04/23/2020   Acute tubular injury of transplanted kidney (HCC) 06/14/2016   AKI (acute kidney injury) (HCC) 06/14/2016   Asthma    Breast cancer (HCC) 2015   left   Bronchitis    CAP (community acquired pneumonia) 04/29/2012   Chronic sinusitis    Closed fracture of medial malleolus 05/11/2018   Community acquired pneumonia 04/29/2012   Confusion 12/06/2016   COVID-19 virus infection 12/17/2020   Diabetes mellitus without complication (HCC)    Family history of breast cancer    GERD (gastroesophageal reflux disease)    History of ankle fracture 05/14/2018   Last Assessment & Plan:  With a recent fall. Right medial malleous. Has ortho follow up soon, splinted now and tylenol  helps her pain   History of recurrent UTIs    Hypertension    Hypokalemia 04/26/2012   Hyponatremia    Hypothyroidism    Imbalance 09/02/2019   Insomnia    Migraine    Neuropathic pain    Orthostatic hypotension 05/08/2018   Ovarian cancer (HCC) 2022   Ovarian mass, left 07/14/2020   Personal history of breast cancer    Personal history of chemotherapy current   bilateral ovarian ca   Pneumonia    Rheumatoid arthritis (HCC)    Sepsis (HCC) 04/26/2012   Sepsis secondary to UTI (HCC) 04/26/2012   Sleep apnea    Stroke [facility])    seen in CT scan   Thyroid  disease    Transient hypotension 06/14/2016    MEDICATIONS: Current Outpatient Medications on File Prior to Visit  Medication Sig Dispense Refill   albuterol  (VENTOLIN  HFA) 108 (90 Base) MCG/ACT inhaler Inhale 1-2 puffs into the lungs  every 6 (six) hours as needed. 18 g 0   Ascorbic Acid (VITAMIN C ) 100 MG tablet Take 1 tablet (100 mg total) by mouth daily. 60 tablet 2   aspirin  EC 325 MG tablet Take 325 mg by mouth daily.     b complex vitamins capsule Take 1 capsule by mouth daily.     blood glucose meter kit and supplies Dispense based on patient and insurance preference. Use up to 2 times daily as directed. (FOR ICD-10 E10.9, E11.9). 1 each 0   Blood Glucose Monitoring Suppl DEVI 1 each by Does not apply route in the morning, at noon, and at bedtime. May substitute to any manufacturer covered by patient's insurance. 1 each 0   Cranberry 1000 MG CAPS Take 1 capsule by mouth 2 (two) times daily.     diclofenac  Sodium (VOLTAREN ) 1 % GEL Apply 2 g topically 3 (three) times daily as needed. For joint pain 100 g 2   donepezil  (ARICEPT ) 10 MG tablet Take 1 tablet daily 90 tablet 3   dorzolamide -timolol  (COSOPT )  22.3-6.8 MG/ML ophthalmic solution Place 1 drop into both eyes 2 (two) times daily.     DULoxetine  (CYMBALTA ) 60 MG capsule Take 1 capsule (60 mg total) by mouth daily. For depression 90 capsule 2   estradiol  (ESTRACE ) 0.1 MG/GM vaginal cream Apply at bedtime, 1g 3 times a week on Monday, Wednesday, Friday. 42.5 g 3   fluticasone  (FLONASE ) 50 MCG/ACT nasal spray USE 2 SPRAYS IN EACH NOSTRIL DAILY AS NEEDED FOR ALLERGIES OR RHINITIS 48 g 2   gabapentin  (NEURONTIN ) 600 MG tablet Take 1 tablet (600 mg total) by mouth 2 (two) times daily. For neuropathy 180 tablet 2   Glucose Blood (BLOOD GLUCOSE TEST STRIPS) STRP 1 each by In Vitro route in the morning, at noon, and at bedtime. May substitute to any manufacturer covered by patient's insurance. 100 strip 5   Lancets Misc. MISC 1 each by Does not apply route in the morning, at noon, and at bedtime. May substitute to any manufacturer covered by patient's insurance. 100 each 5   latanoprost  (XALATAN ) 0.005 % ophthalmic solution Place 1 drop into both eyes at bedtime.      levothyroxine  (SYNTHROID ) 112 MCG tablet TAKE 1 TABLET EVERY MORNING MONDAY THROUGH SATURDAY; AND 1/2 TABLET ON SUNDAY. TAKE ON AN EMPTY STOMACH WITH WATER ONLY 78 tablet 2   meclizine  (ANTIVERT ) 25 MG tablet Take 1 tablet (25 mg total) by mouth 2 (two) times daily as needed for dizziness. 180 tablet 0   meloxicam  (MOBIC ) 15 MG tablet TAKE 1 TABLET EVERY DAY AS NEEDED FOR PAIN 90 tablet 1   memantine  (NAMENDA ) 5 MG tablet Take 1 tablet (5 mg total) by mouth 2 (two) times daily. 180 tablet 3   metFORMIN  (GLUCOPHAGE -XR) 500 MG 24 hr tablet Take 2 tablets (1,000 mg total) by mouth daily with breakfast. for diabetes. 180 tablet 1   methenamine  (HIPREX ) 1 g tablet Take 1 tablet (1 g total) by mouth 2 (two) times daily with a meal. 60 tablet 2   mirtazapine  (REMERON ) 15 MG tablet Take 1 tablet (15 mg total) by mouth at bedtime. For sleep 90 tablet 1   Netarsudil Dimesylate (RHOPRESSA) 0.02 % SOLN Place 1 drop into both eyes daily at 6 (six) AM.     nystatin  cream (MYCOSTATIN ) Apply 1 Application topically 2 (two) times daily as needed. 30 g 2   omeprazole  (PRILOSEC) 20 MG capsule TAKE 1 CAPSULE TWICE DAILY FOR HEARTBURN. 180 capsule 2   phenazopyridine  (PYRIDIUM ) 200 MG tablet Take 1 tablet (200 mg total) by mouth 3 (three) times daily as needed for pain. 10 tablet 0   pilocarpine  (PILOCAR) 2 % ophthalmic solution Place 1 drop into both eyes 4 (four) times daily.      propranolol  (INDERAL ) 80 MG tablet Take 1 tablet (80 mg total) by mouth daily. For headache prevention 90 tablet 2   rosuvastatin  (CRESTOR ) 10 MG tablet TAKE 1 TABLET EVERY DAY FOR CHOLESTEROL 90 tablet 2   SUMAtriptan  (IMITREX ) 25 MG tablet TAKE 1 TABLET AS DIRECTED EVERY 2 HOURS AS NEEDED FOR MIGRAINE. MAY REPEAT IN 2 HOURS IF HEADACHE PERSISTS OR RECURS. 9 tablet 11   triamcinolone  ointment (KENALOG ) 0.5 % Apply 1 Application topically 2 (two) times daily. 30 g 0   VITAMIN D  PO Take by mouth.     Accu-Chek FastClix Lancets MISC USE AS  INSTRUCTED TO TEST BLOOD SUGAR DAILY 102 each 1   botulinum toxin Type A  (BOTOX ) 200 units injection INJECT 155 UNITS INTRAMUSCULARLY INTO MULTIPLE  SITES OF FACE, HEAD, AND NECK EVERY 90 DAYS (Patient not taking: Reported on 01/19/2024) 1 each 4   No current facility-administered medications on file prior to visit.    ALLERGIES: No Known Allergies  FAMILY HISTORY: Family History  Problem Relation Age of Onset   Hypertension Mother    Diabetes Daughter    Hypertension Daughter    Fibromyalgia Daughter    GER disease Daughter    Fibromyalgia Daughter    Crohn's disease Daughter    Asthma Daughter    Breast cancer Niece    Uterine cancer Niece    Breast cancer Other    Bladder Cancer Neg Hx    Renal cancer Neg Hx     SOCIAL HISTORY: Social History   Socioeconomic History   Marital status: Widowed    Spouse name: Not on file   Number of children: 2   Years of education: Not on file   Highest education level: Some college, no degree  Occupational History   Occupation: retired   Tobacco Use   Smoking status: Never   Smokeless tobacco: Never  Vaping Use   Vaping status: Never Used  Substance and Sexual Activity   Alcohol use: No   Drug use: No   Sexual activity: Not Currently    Partners: Male    Birth control/protection: Post-menopausal  Other Topics Concern   Not on file  Social History Narrative   Pt lives in 1 story home with her daughter, Luke and [name]'s husband   Has 2 adult daughters   Highest level of education: some college   Worked mainly as Environmental health practitioner.   Social Drivers of Corporate investment banker Strain: Low Risk  (11/23/2023)   Overall Financial Resource Strain (CARDIA)    Difficulty of Paying Living Expenses: Not hard at all  Food Insecurity: No Food Insecurity (11/23/2023)   Hunger Vital Sign    Worried About Running Out of Food in the Last Year: Never true    Ran Out of Food in the Last Year: Never true  Transportation Needs: No  Transportation Needs (11/23/2023)   PRAPARE - Administrator, Civil Service (Medical): No    Lack of Transportation (Non-Medical): No  Physical Activity: Inactive (11/23/2023)   Exercise Vital Sign    Days of Exercise per Week: 0 days    Minutes of Exercise per Session: Not on file  Stress: No Stress Concern Present (11/23/2023)   [employer] of Occupational Health - Occupational Stress Questionnaire    Feeling of Stress: Not at all  Social Connections: Socially Isolated (11/23/2023)   Social Connection and Isolation Panel    Frequency of Communication with Friends and Family: More than three times a week    Frequency of Social Gatherings with Friends and Family: Once a week    Attends Religious Services: Never    Database administrator or Organizations: No    Attends Engineer, structural: Not on file    Marital Status: Widowed  Intimate Partner Violence: Not At Risk (08/07/2023)   Humiliation, Afraid, Rape, and Kick questionnaire    Fear of Current or Ex-Partner: No    Emotionally Abused: No    Physically Abused: No    Sexually Abused: No     PHYSICAL EXAM: Vitals:   01/19/24 1509  BP: 114/68  Pulse: 78  SpO2: 97%   General: No acute distress Head:  Normocephalic/atraumatic Skin/Extremities: No rash, no edema Neurological Exam: alert and oriented to  person, place, and time. No aphasia or dysarthria. Fund of knowledge is appropriate.  Recent and remote memory are intact.  Attention and concentration are normal.  MMSE 27/30    01/19/2024    3:00 PM 02/08/2022   12:00 PM 03/02/2021   11:00 AM  MMSE - Mini Mental State Exam  Orientation to time 4 4 5   Orientation to Place 5 5 5   Registration 3 3 3   Attention/ Calculation 5 5 4   Recall 2 3 3   Language- name 2 objects 2 2 2   Language- repeat 1 1 1   Language- follow 3 step command 3 2 2   Language- read & follow direction 1 1 1   Write a sentence 1 1 1   Copy design 0 1 1  Total score 27 28 28     Cranial nerves: Pupils equal, round. Extraocular movements intact with no nystagmus. Visual fields full.  No facial asymmetry.  Motor: Bulk and tone normal, muscle strength 5/5 throughout with no pronator drift.   Finger to nose testing intact.  Gait slow and cautious with walker, no ataxia.    IMPRESSION: This is an 89 yo RH woman with a history of hypertension, diabetes, rheumatoid arthritis, breast cancer, with chronic migraines and mild dementia. She had good response to Botox  however it has become cost-prohibitive with insurance coverage changes. Thankfully migraines have not been significantly bothersome. She has prn sumatriptan  for rescue. She is on Gabapentin  for neuropathy. Memory stable, MMSE today 27/30. Continue Donepezil  10mg  daily and Memantine  5mg  BID. Continue close supervision. Follow-up in 1 year, call for any changes.   Thank you for allowing me to participate in her care.  Please do not hesitate to call for any questions or concerns.    Darice Shivers, M.D.   CC: Comer Gaskins, NP

## 2024-01-19 NOTE — Patient Instructions (Signed)
 Good to see you! Continue all your medications. Follow-up in 1 year, call for any changes.   FALL PRECAUTIONS: Be cautious when walking. Scan the area for obstacles that may increase the risk of trips and falls. When getting up in the mornings, sit up at the edge of the bed for a few minutes before getting out of bed. Consider elevating the bed at the head end to avoid drop of blood pressure when getting up. Walk always in a well-lit room (use night lights in the walls). Avoid area rugs or power cords from appliances in the middle of the walkways. Use a walker or a cane if necessary and consider physical therapy for balance exercise. Get your eyesight checked regularly.  HOME SAFETY: Consider the safety of the kitchen when operating appliances like stoves, microwave oven, and blender. Consider having supervision and share cooking responsibilities until no longer able to participate in those. Accidents with firearms and other hazards in the house should be identified and addressed as well.  ABILITY TO BE LEFT ALONE: If patient is unable to contact 911 operator, consider using LifeLine, or when the need is there, arrange for someone to stay with patients. Smoking is a fire hazard, consider supervision or cessation. Risk of wandering should be assessed by caregiver and if detected at any point, supervision and safe proof recommendations should be instituted.   RECOMMENDATIONS FOR ALL PATIENTS WITH MEMORY PROBLEMS: 1. Continue to exercise (Recommend 30 minutes of walking everyday, or 3 hours every week) 2. Increase social interactions - continue going to [place] and enjoy social gatherings with friends and family 3. Eat healthy, avoid fried foods and eat more fruits and vegetables 4. Maintain adequate blood pressure, blood sugar, and blood cholesterol level. Reducing the risk of stroke and cardiovascular disease also helps promoting better memory. 5. Avoid stressful situations. Live a simple life and avoid  aggravations. Organize your time and prepare for the next day in anticipation. 6. Sleep well, avoid any interruptions of sleep and avoid any distractions in the bedroom that may interfere with adequate sleep quality 7. Avoid sugar, avoid sweets as there is a strong link between excessive sugar intake, diabetes, and cognitive impairment The Mediterranean diet has been shown to help patients reduce the risk of progressive memory disorders and reduces cardiovascular risk. This includes eating fish, eat fruits and green leafy vegetables, nuts like almonds and hazelnuts, walnuts, and also use olive oil. Avoid fast foods and fried foods as much as possible. Avoid sweets and sugar as sugar use has been linked to worsening of memory function.  There is always a concern of gradual progression of memory problems. If this is the case, then we may need to adjust level of care according to patient needs. Support, both to the patient and caregiver, should then be put into place.

## 2024-01-24 ENCOUNTER — Encounter: Payer: Self-pay | Admitting: Primary Care

## 2024-01-24 ENCOUNTER — Ambulatory Visit: Admitting: Primary Care

## 2024-01-24 VITALS — BP 128/84 | HR 86 | Temp 97.8°F | Ht 64.0 in | Wt 162.0 lb

## 2024-01-24 DIAGNOSIS — Z23 Encounter for immunization: Secondary | ICD-10-CM

## 2024-01-24 DIAGNOSIS — E1142 Type 2 diabetes mellitus with diabetic polyneuropathy: Secondary | ICD-10-CM

## 2024-01-24 DIAGNOSIS — E1165 Type 2 diabetes mellitus with hyperglycemia: Secondary | ICD-10-CM

## 2024-01-24 LAB — POCT GLYCOSYLATED HEMOGLOBIN (HGB A1C): Hemoglobin A1C: 8.9 % — AB (ref 4.0–5.6)

## 2024-01-24 MED ORDER — METFORMIN HCL ER 500 MG PO TB24: ORAL_TABLET | ORAL | 1 refills | Status: DC

## 2024-01-24 NOTE — Patient Instructions (Addendum)
 Increase dose of metformin  500 mg. Take 2 tablets in the morning and 1 tablet at night.  Continue to monitor blood glucose once or twice a day.  Education on eating less sugar and better dietary choices.   Follow up in 3 months.

## 2024-01-24 NOTE — Assessment & Plan Note (Addendum)
 Uncontrolled and increased with A1c today of 8.9.  Due to elevated A1c of 8.9, we are increasing the dose of metformin  to 1500 mg daily. Start taking 2 tablets in the morning, 1,000 mg, and 1 tablet in the evening, 500 mg.   She will continue checking blood glucose once or twice a day to monitor blood glucose with the increase in medication.   We discussed education on choosing better dietary choices. Patient agrees to work on.  Follow up in 3 months.  I evaluated patient, was consulted regarding treatment, and agree with assessment and plan per Reya Aurich, MSN, FNP student.   Mallie Gaskins, NP-C

## 2024-01-24 NOTE — Progress Notes (Signed)
 Subjective:    Patient ID: Shannon Higgins, female    DOB: [birth year], 89 y.o.   MRN: [id number]  Shannon Higgins is a very pleasant 89 y.o. female with a history of hypertension, type 2 diabetes, hypothyroidism, dementia, recurrent falls, breast cancer, ovarian cancer who presents today for follow-up diabetes.  Current medications include: Metformin  ER 1000 mg daily  He/She is checking his/her blood glucose on occasion and is getting readings of 250-300  Last A1C: 8.1 in June 2025, 8.9 today Last Eye Exam: Due Last Foot Exam: Due Pneumonia Vaccination: 2020 Urine Microalbumin: Up-to-date Statin: Rosuvastatin   Dietary changes since last visit: Increased intake of carbs and sweets.    Exercise: None   BP Readings from Last 3 Encounters:  01/24/24 128/84  01/19/24 114/68  12/22/23 119/79      Review of Systems  Respiratory:  Negative for shortness of breath.   Cardiovascular:  Negative for chest pain.  Gastrointestinal:  Negative for diarrhea and nausea.  Neurological:  Positive for numbness.         Past Medical History:  Diagnosis Date   Acute bronchitis 05/13/2022   Acute metabolic encephalopathy 04/23/2020   Acute tubular injury of transplanted kidney 06/14/2016   AKI (acute kidney injury) 06/14/2016   Asthma    Breast cancer (HCC) 2015   left   Bronchitis    CAP (community acquired pneumonia) 04/29/2012   Chronic sinusitis    Closed fracture of medial malleolus 05/11/2018   Community acquired pneumonia 04/29/2012   Confusion 12/06/2016   COVID-19 virus infection 12/17/2020   Diabetes mellitus without complication (HCC)    Family history of breast cancer    GERD (gastroesophageal reflux disease)    History of ankle fracture 05/14/2018   Last Assessment & Plan:  With a recent fall. Right medial malleous. Has ortho follow up soon, splinted now and tylenol  helps her pain   History of recurrent UTIs    Hypertension    Hypokalemia 04/26/2012    Hyponatremia    Hypothyroidism    Imbalance 09/02/2019   Insomnia    Migraine    Neuropathic pain    Orthostatic hypotension 05/08/2018   Ovarian cancer (HCC) 2022   Ovarian mass, left 07/14/2020   Personal history of breast cancer    Personal history of chemotherapy current   bilateral ovarian ca   Pneumonia    Rheumatoid arthritis (HCC)    Sepsis (HCC) 04/26/2012   Sepsis secondary to UTI (HCC) 04/26/2012   Sleep apnea    Stroke [facility])    seen in CT scan   Thyroid  disease    Transient hypotension 06/14/2016    Social History   Socioeconomic History   Marital status: Widowed    Spouse name: Not on file   Number of children: 2   Years of education: Not on file   Highest education level: Some college, no degree  Occupational History   Occupation: retired   Tobacco Use   Smoking status: Never   Smokeless tobacco: Never  Vaping Use   Vaping status: Never Used  Substance and Sexual Activity   Alcohol use: No   Drug use: No   Sexual activity: Not Currently    Partners: Male    Birth control/protection: Post-menopausal  Other Topics Concern   Not on file  Social History Narrative   Pt lives in 1 story home with her daughter, Shannon Higgins and Shannon Higgins's husband   Has 2 adult daughters   Highest level of education:  some college   Worked mainly as Environmental health practitioner.   Social Drivers of Corporate investment banker Strain: Low Risk  (11/23/2023)   Overall Financial Resource Strain (CARDIA)    Difficulty of Paying Living Expenses: Not hard at all  Food Insecurity: No Food Insecurity (11/23/2023)   Hunger Vital Sign    Worried About Running Out of Food in the Last Year: Never true    Ran Out of Food in the Last Year: Never true  Transportation Needs: No Transportation Needs (11/23/2023)   PRAPARE - Administrator, Civil Service (Medical): No    Lack of Transportation (Non-Medical): No  Physical Activity: Inactive (11/23/2023)   Exercise Vital Sign    Days of  Exercise per Week: 0 days    Minutes of Exercise per Session: Not on file  Stress: No Stress Concern Present (11/23/2023)   [employer] of Occupational Health - Occupational Stress Questionnaire    Feeling of Stress: Not at all  Social Connections: Socially Isolated (11/23/2023)   Social Connection and Isolation Panel    Frequency of Communication with Friends and Family: More than three times a week    Frequency of Social Gatherings with Friends and Family: Once a week    Attends Religious Services: Never    Database administrator or Organizations: No    Attends Engineer, structural: Not on file    Marital Status: Widowed  Intimate Partner Violence: Not At Risk (08/07/2023)   Humiliation, Afraid, Rape, and Kick questionnaire    Fear of Current or Ex-Partner: No    Emotionally Abused: No    Physically Abused: No    Sexually Abused: No    Past Surgical History:  Procedure Laterality Date   ABDOMINAL HYSTERECTOMY     BACK SURGERY     BREAST BIOPSY Right ?`   benign   BREAST LUMPECTOMY Left 2015   breast ca   CHOLECYSTECTOMY     JOINT REPLACEMENT     LAPAROTOMY N/A 07/14/2020   Procedure: LAPAROTOMY;  Surgeon: Arloa Lamar SQUIBB, MD;  Location: [facility] ORS;  Service: Gynecology;  Laterality: N/A;   OOPHORECTOMY     OVARIAN CYST REMOVAL     PORTA CATH INSERTION N/A 07/27/2020   Procedure: PORTA CATH INSERTION;  Surgeon: Marea Selinda RAMAN, MD;  Location: [facility];  Service: Cardiovascular;  Laterality: N/A;   PORTA CATH REMOVAL N/A 04/05/2021   Procedure: PORTA CATH REMOVAL;  Surgeon: Marea Selinda RAMAN, MD;  Location: [facility];  Service: Cardiovascular;  Laterality: N/A;   REPLACEMENT TOTAL KNEE Right    SALPINGOOPHORECTOMY Bilateral 07/14/2020   Procedure: OPEN SALPINGO OOPHORECTOMY;  Surgeon: Arloa Lamar SQUIBB, MD;  Location: [facility];  Service: Gynecology;  Laterality: Bilateral;   TOTAL SHOULDER REPLACEMENT Left     Family History  Problem Relation Age of  Onset   Hypertension Mother    Diabetes Daughter    Hypertension Daughter    Fibromyalgia Daughter    GER disease Daughter    Fibromyalgia Daughter    Crohn's disease Daughter    Asthma Daughter    Breast cancer Niece    Uterine cancer Niece    Breast cancer Other    Bladder Cancer Neg Hx    Renal cancer Neg Hx     No Known Allergies  Current Outpatient Medications on File Prior to Visit  Medication Sig Dispense Refill   Accu-Chek FastClix Lancets MISC USE AS INSTRUCTED TO TEST BLOOD  SUGAR DAILY 102 each 1   albuterol  (VENTOLIN  HFA) 108 (90 Base) MCG/ACT inhaler Inhale 1-2 puffs into the lungs every 6 (six) hours as needed. 18 g 0   Ascorbic Acid (VITAMIN C ) 100 MG tablet Take 1 tablet (100 mg total) by mouth daily. 60 tablet 2   aspirin  EC 325 MG tablet Take 325 mg by mouth daily.     b complex vitamins capsule Take 1 capsule by mouth daily.     blood glucose meter kit and supplies Dispense based on patient and insurance preference. Use up to 2 times daily as directed. (FOR ICD-10 E10.9, E11.9). 1 each 0   Blood Glucose Monitoring Suppl DEVI 1 each by Does not apply route in the morning, at noon, and at bedtime. May substitute to any manufacturer covered by patient's insurance. 1 each 0   Cranberry 1000 MG CAPS Take 1 capsule by mouth 2 (two) times daily.     diclofenac  Sodium (VOLTAREN ) 1 % GEL Apply 2 g topically 3 (three) times daily as needed. For joint pain 100 g 2   donepezil  (ARICEPT ) 10 MG tablet Take 1 tablet daily 90 tablet 4   dorzolamide -timolol  (COSOPT ) 22.3-6.8 MG/ML ophthalmic solution Place 1 drop into both eyes 2 (two) times daily.     DULoxetine  (CYMBALTA ) 60 MG capsule Take 1 capsule (60 mg total) by mouth daily. For depression 90 capsule 2   estradiol  (ESTRACE ) 0.1 MG/GM vaginal cream Apply at bedtime, 1g 3 times a week on Monday, Wednesday, Friday. 42.5 g 3   fluticasone  (FLONASE ) 50 MCG/ACT nasal spray USE 2 SPRAYS IN EACH NOSTRIL DAILY AS NEEDED FOR ALLERGIES  OR RHINITIS 48 g 2   gabapentin  (NEURONTIN ) 600 MG tablet Take 1 tablet (600 mg total) by mouth 2 (two) times daily. For neuropathy 180 tablet 2   Glucose Blood (BLOOD GLUCOSE TEST STRIPS) STRP 1 each by In Vitro route in the morning, at noon, and at bedtime. May substitute to any manufacturer covered by patient's insurance. 100 strip 5   Lancets Misc. MISC 1 each by Does not apply route in the morning, at noon, and at bedtime. May substitute to any manufacturer covered by patient's insurance. 100 each 5   latanoprost  (XALATAN ) 0.005 % ophthalmic solution Place 1 drop into both eyes at bedtime.     levothyroxine  (SYNTHROID ) 112 MCG tablet TAKE 1 TABLET EVERY MORNING MONDAY THROUGH SATURDAY; AND 1/2 TABLET ON SUNDAY. TAKE ON AN EMPTY STOMACH WITH WATER ONLY 78 tablet 2   meclizine  (ANTIVERT ) 25 MG tablet Take 1 tablet (25 mg total) by mouth 2 (two) times daily as needed for dizziness. 180 tablet 0   meloxicam  (MOBIC ) 15 MG tablet TAKE 1 TABLET EVERY DAY AS NEEDED FOR PAIN 90 tablet 1   memantine  (NAMENDA ) 5 MG tablet Take 1 tablet (5 mg total) by mouth 2 (two) times daily. 180 tablet 4   methenamine  (HIPREX ) 1 g tablet Take 1 tablet (1 g total) by mouth 2 (two) times daily with a meal. 60 tablet 2   mirtazapine  (REMERON ) 15 MG tablet Take 1 tablet (15 mg total) by mouth at bedtime. For sleep 90 tablet 1   Netarsudil Dimesylate (RHOPRESSA) 0.02 % SOLN Place 1 drop into both eyes daily at 6 (six) AM.     nystatin  cream (MYCOSTATIN ) Apply 1 Application topically 2 (two) times daily as needed. 30 g 2   omeprazole  (PRILOSEC) 20 MG capsule TAKE 1 CAPSULE TWICE DAILY FOR HEARTBURN. 180 capsule 2  phenazopyridine  (PYRIDIUM ) 200 MG tablet Take 1 tablet (200 mg total) by mouth 3 (three) times daily as needed for pain. 10 tablet 0   pilocarpine  (PILOCAR) 2 % ophthalmic solution Place 1 drop into both eyes 4 (four) times daily.      propranolol  (INDERAL ) 80 MG tablet Take 1 tablet (80 mg total) by mouth daily.  For headache prevention 90 tablet 2   rosuvastatin  (CRESTOR ) 10 MG tablet TAKE 1 TABLET EVERY DAY FOR CHOLESTEROL 90 tablet 2   SUMAtriptan  (IMITREX ) 25 MG tablet TAKE 1 TABLET AS DIRECTED EVERY 2 HOURS AS NEEDED FOR MIGRAINE. MAY REPEAT IN 2 HOURS IF HEADACHE PERSISTS OR RECURS. 9 tablet 11   triamcinolone  ointment (KENALOG ) 0.5 % Apply 1 Application topically 2 (two) times daily. 30 g 0   VITAMIN D  PO Take by mouth.     botulinum toxin Type A  (BOTOX ) 200 units injection INJECT 155 UNITS INTRAMUSCULARLY INTO MULTIPLE SITES OF FACE, HEAD, AND NECK EVERY 90 DAYS (Patient not taking: Reported on 01/24/2024) 1 each 4   No current facility-administered medications on file prior to visit.    BP 128/84   Pulse 86   Temp 97.8 F (36.6 C) (Temporal)   Ht 5' 4 (1.626 m)   Wt 162 lb (73.5 kg)   SpO2 96%   BMI 27.81 kg/m  Objective:   Physical Exam Cardiovascular:     Rate and Rhythm: Normal rate and regular rhythm.  Pulmonary:     Effort: Pulmonary effort is normal.     Breath sounds: Normal breath sounds.  Musculoskeletal:     Cervical back: Neck supple.  Skin:    General: Skin is warm and dry.  Neurological:     Mental Status: She is alert and oriented to person, place, and time.  Psychiatric:        Mood and Affect: Mood normal.     Physical Exam        Assessment & Plan:  Type 2 diabetes mellitus with peripheral neuropathy (HCC) -     POCT glycosylated hemoglobin (Hb A1C) -     metFORMIN  HCl ER; Take 2 tablets by mouth in the morning and 1 tablet in the evening for diabetes.  Dispense: 270 tablet; Refill: 1 -     Microalbumin / creatinine urine ratio  Encounter for immunization -     Flu vaccine HIGH DOSE PF(Fluzone Trivalent)  Encounter for immunization -     Pneumococcal conjugate vaccine 20-valent  Type 2 diabetes mellitus with hyperglycemia, without long-term current use of insulin  (HCC) Assessment & Plan: Uncontrolled and increased with A1c today of 8.9.  Due  to elevated A1c of 8.9, we are increasing the dose of metformin  to 1500 mg daily. Start taking 2 tablets in the morning, 1,000 mg, and 1 tablet in the evening, 500 mg.   She will continue checking blood glucose once or twice a day to monitor blood glucose with the increase in medication.   We discussed education on choosing better dietary choices. Patient agrees to work on.  Follow up in 3 months.  I evaluated patient, was consulted regarding treatment, and agree with assessment and plan per Kristin Rudd, MSN, FNP student.   Mallie Gaskins, NP-C      Assessment and Plan Assessment & Plan         Comer MARLA Gaskins, NP     History of Present Illness

## 2024-01-24 NOTE — Progress Notes (Signed)
   Established Patient Office Visit  Subjective   Patient ID: Shannon Higgins, female    DOB: 27-Nov-1934  Age: 88 y.o. MRN: [id number]  No chief complaint on file.   HPI  Shannon Higgins is an 89 year old female with a history of type 2 diabetes, OSA, dementia, and hyperlipidemia that presents for a 3 month follow-up on diabetes.  Current medications include: Metformin  1,000 mg tablet.  Daughter has been checking infrequently She is getting readings of - Fasting 190-200 and 2 Hours after meal 250.  Last A1C: 8.1 on 10/24/23. Today is 8.9. Last Eye Exam: UTD every 3 months Last Foot Exam: UTD Pneumonia Vaccination: UTD Urine Microalbumin: Obtained today. Statin: Rosuvastatin  10 mg  Dietary changes since last visit: No changes in diet. Enjoys sweets.   Exercise: None    Review of Systems  Eyes: Negative.   Cardiovascular: Negative.   Gastrointestinal: Negative.   Genitourinary: Negative.   Musculoskeletal: Negative.   Neurological:  Positive for tingling.      Objective:     There were no vitals taken for this visit.   Physical Exam Cardiovascular:     Rate and Rhythm: Normal rate and regular rhythm.  Pulmonary:     Effort: Pulmonary effort is normal.     Breath sounds: Normal breath sounds.  Skin:    Capillary Refill: Capillary refill takes less than 2 seconds.  Neurological:     Mental Status: She is alert and oriented to person, place, and time.      No results found for any visits on 01/24/24.    The ASCVD Risk score (Arnett DK, et al., 2019) failed to calculate for the following reasons:   The 2019 ASCVD risk score is only valid for ages 56 to 63   Risk score cannot be calculated because patient has a medical history suggesting prior/existing ASCVD    Assessment & Plan:   Problem List Items Addressed This Visit   None   No follow-ups on file.    Kerrigan Gombos, RN

## 2024-01-25 ENCOUNTER — Ambulatory Visit: Payer: Self-pay | Admitting: Primary Care

## 2024-01-25 LAB — MICROALBUMIN / CREATININE URINE RATIO
Creatinine,U: 176 mg/dL
Microalb Creat Ratio: 222.3 mg/g — ABNORMAL HIGH (ref 0.0–30.0)
Microalb, Ur: 39.1 mg/dL — ABNORMAL HIGH (ref 0.0–1.9)

## 2024-01-25 NOTE — Telephone Encounter (Signed)
 My chart sent to patient to let know pcp out of office.

## 2024-01-27 ENCOUNTER — Encounter: Payer: Self-pay | Admitting: Neurology

## 2024-02-11 ENCOUNTER — Other Ambulatory Visit: Payer: Self-pay | Admitting: Neurology

## 2024-02-11 DIAGNOSIS — F03A0 Unspecified dementia, mild, without behavioral disturbance, psychotic disturbance, mood disturbance, and anxiety: Secondary | ICD-10-CM

## 2024-02-12 ENCOUNTER — Other Ambulatory Visit: Payer: Self-pay | Admitting: Primary Care

## 2024-02-12 DIAGNOSIS — G8929 Other chronic pain: Secondary | ICD-10-CM

## 2024-02-12 MED ORDER — PROPRANOLOL HCL 80 MG PO TABS: 80.0000 mg | ORAL_TABLET | Freq: Every day | ORAL | 0 refills | Status: AC

## 2024-02-12 NOTE — Telephone Encounter (Unsigned)
 Copied from CRM #[id number]. Topic: Clinical - Medication Refill >> Feb 12, 2024  1:15 PM Macario HERO wrote: Medication: propranolol  (INDERAL ) 80 MG tablet [[id number]]  Has the patient contacted their pharmacy? Yes (Agent: If no, request that the patient contact the pharmacy for the refill. If patient does not wish to contact the pharmacy document the reason why and proceed with request.) (Agent: If yes, when and what did the pharmacy advise?)  This is the patient's preferred pharmacy:   [facility] - [place], [place] - [address] [place] [address] Phone: [phone] Fax: [phone]   Is this the correct pharmacy for this prescription? Yes If no, delete pharmacy and type the correct one.   Has the prescription been filled recently? No  Is the patient out of the medication? Yes  Has the patient been seen for an appointment in the last year OR does the patient have an upcoming appointment? Yes  Can we respond through MyChart? Yes  Agent: Please be advised that Rx refills may take up to 3 business days. We ask that you follow-up with your pharmacy.

## 2024-02-14 ENCOUNTER — Inpatient Hospital Stay: Admitting: Obstetrics and Gynecology

## 2024-02-14 ENCOUNTER — Inpatient Hospital Stay: Attending: Primary Care

## 2024-02-14 VITALS — BP 113/57 | HR 72 | Temp 98.0°F | Wt 158.0 lb

## 2024-02-14 DIAGNOSIS — Z853 Personal history of malignant neoplasm of breast: Secondary | ICD-10-CM | POA: Diagnosis not present

## 2024-02-14 DIAGNOSIS — Z8543 Personal history of malignant neoplasm of ovary: Secondary | ICD-10-CM | POA: Insufficient documentation

## 2024-02-14 DIAGNOSIS — C569 Malignant neoplasm of unspecified ovary: Secondary | ICD-10-CM | POA: Diagnosis not present

## 2024-02-14 DIAGNOSIS — Z90722 Acquired absence of ovaries, bilateral: Secondary | ICD-10-CM | POA: Insufficient documentation

## 2024-02-14 DIAGNOSIS — Z9079 Acquired absence of other genital organ(s): Secondary | ICD-10-CM | POA: Diagnosis not present

## 2024-02-14 NOTE — Progress Notes (Signed)
 Gynecologic Oncology Consult Visit   Referring Provider: Dr. 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 female, who returns to clinic for continued surveillance of high grade serous ovarian cancer s/p BSO, laparotomy, partial omentectomy with some spillage on 07/14/20, followed by 4 cycles of adjuvant carbo-doxil , completed 09/2020 (last 2 cycles omitted d/t poor tolerance). She returns to clinic for continued surveillance.   No new complatints.  09/13/23 CA125 16  Gyn Oncology History 04/24/20 admitted to [facility].  Presented to the ER with several day history of weakness, confusion, intermittent fever and thought to have UTI with sepsis.   04/23/20 - CT Abdomen Pelvis w contrast 13.5 cm x 11.8 cm x 15.2 cm simple, cystic appearing area is seen within the pelvis along the midline. This extends from the lower abdomen to the region just above the urinary bladder. Marked severity right-sided hydronephrosis and hydroureter with mild right perinephric inflammatory fat stranding and delayed right renal cortical enhancement. No obstructing renal calculi are identified. The dilated right ureter extends to the level of a large pelvic cyst   Seen by Dr Arloa for the large pelvic mass.  CA 125  22.9 HE4   99 Postmenopausal ROMA 2.65  Underwent Bilateral salpingo-oophorectomy, laparotomy, partial omentectomy with Dr. Arloa and Dr. Victor on 07/14/20.  Controlled drainage of the mass done with purse string with some spillage.  No disease seen outside ovary.    07/14/20  DIAGNOSIS:  A. OVARY AND FALLOPIAN TUBE, LEFT; SALPINGO-OOPHORECTOMY:  - HIGH-GRADE SEROUS CARCINOMA INVOLVING SEROUS CYSTADENOMA OF OVARY, SEE SUMMARY BELOW.  - FALLOPIAN TUBE NEGATIVE FOR INVASIVE AND INTRAEPITHELIAL CARCINOMA.   B. OVARY AND FALLOPIAN TUBE, RIGHT; SALPINGO-OOPHORECTOMY:  - HIGH-GRADE SEROUS CARCINOMA, ONE 1.0 CM FOCUS, INVOLVING FRAGMENTED  OVARY.   - FALLOPIAN TUBE NEGATIVE FOR INVASIVE AND INTRAEPITHELIAL CARCINOMA.   C. OMENTUM; BIOPSY:  - NEGATIVE FOR MALIGNANCY.   CANCER CASE SUMMARY: OVARY or FALLOPIAN TUBE or PRIMARY PERITONEUM. Standard(s): AJCC-UICC 8, FIGO Cancer Report 2018   SPECIMEN  Procedure: Bilateral salpingo-oophorectomy and omental biopsy  Specimen Integrity:       Left ovary integrity: Capsule ruptured (intraoperative spillage of cyst contents)       Right ovary integrity: Fragmented  TUMOR  Tumor Site: Bilateral ovaries  Tumor Size: Greatest dimension: 4 cm  Histologic Type: High-grade serous carcinoma  Histologic Grade: High-grade  Ovarian Surface Involvement:       Left: Not identified       Right: cannot be determined due to fragmented specimen  Fallopian Tube Surface Involvement: Not identified  Other Tissue/ Organ Involvement: Not applicable  Largest Extrapelvic Peritoneal Focus: Not applicable  Peritoneal/Ascitic Fluid Involvement: Results pending, see [id number]  Chemotherapy Response Score (CRS): Not applicable   REGIONAL LYMPH NODES  Regional Lymph Nodes Status: Not applicable (no regional lymph nodes submitted)   DISTANT METASTASIS  Distant Site(s) Involved, if applicable: Not applicable   ADDENDUM:  Peritoneal/Ascitic Fluid Involvement:  Not identified.  [id number]: pelvic washings negative for malignancy   FINAL PATHOLOGIC STAGE CLASSIFICATION (pTNM, AJCC 8th Edition):  pT1c1 (surgical spill),  pN not assigned (no nodes submitted)  pM - Not applicable   CA 125  06/11/20 22.9  07/21/20 98.1 (post surgical) 11/24/20 20.3 03/31/21 14.7 09/20/21 15.1 03/28/22 17.1 07/20/22 16.1 01/20/23 15.7  Received four cycles of carboplatin /Doxil  post op.  Did not get Taxol due to pre existing neuropathy. Dr. Melanee stopped treatment after 4 cycles due  to poor tolerance.   Genetic testing Myriad MyRisk negative. HRD testing not done.   Problem List: Patient Active Problem List   Diagnosis  Date Noted   Cerumen impaction 07/21/2023   SUI (stress urinary incontinence, female) 06/15/2023   Incontinence of feces 06/15/2023   SOB (shortness of breath) 01/30/2023   Pelvic pressure in female 11/08/2022   Chronic pain of both shoulders 08/24/2022   Weakness 01/27/2022   Personal history of breast cancer    Family history of breast cancer    High grade ovarian cancer (HCC) 07/21/2020   S/P bilateral oophorectomy 07/21/2020   Decreased mobility 09/02/2019   Preventative health care 03/04/2019   Insomnia 09/27/2018   History of TIA (transient ischemic attack) 09/13/2018   Recurrent UTI 06/20/2018   Frequent falls 05/14/2018   Gout 05/14/2018   Type 2 diabetes mellitus with peripheral neuropathy (HCC) 05/11/2018   Type 2 diabetes mellitus (HCC) 05/11/2018   History of breast cancer 10/17/2017   MDD (major depressive disorder), recurrent episode 06/21/2017   Chronic low back pain 01/13/2017   Hyperlipidemia 12/06/2016   Glaucoma 12/06/2016   Neuropathic pain 12/06/2016   Malignant melanoma of skin (HCC) 12/06/2016   Dementia arising in the senium and presenium (HCC) 12/06/2016   Chronic headache disorder 12/06/2016   Hypothyroidism 06/14/2016   Anemia 04/26/2012   Hypertensive disorder 04/26/2012   Gastroesophageal reflux disease 04/26/2012   Obstructive sleep apnea syndrome 04/26/2012    Past Medical History: Past Medical History:  Diagnosis Date   Acute bronchitis 05/13/2022   Acute metabolic encephalopathy 04/23/2020   Acute tubular injury of transplanted kidney 06/14/2016   AKI (acute kidney injury) 06/14/2016   Asthma    Breast cancer (HCC) 2015   left   Bronchitis    CAP (community acquired pneumonia) 04/29/2012   Chronic sinusitis    Closed fracture of medial malleolus 05/11/2018   Community acquired pneumonia 04/29/2012   Confusion 12/06/2016   COVID-19 virus infection 12/17/2020   Diabetes mellitus without complication (HCC)    Family history of  breast cancer    GERD (gastroesophageal reflux disease)    History of ankle fracture 05/14/2018   Last Assessment & Plan:  With a recent fall. Right medial malleous. Has ortho follow up soon, splinted now and tylenol  helps her pain   History of recurrent UTIs    Hypertension    Hypokalemia 04/26/2012   Hyponatremia    Hypothyroidism    Imbalance 09/02/2019   Insomnia    Migraine    Neuropathic pain    Orthostatic hypotension 05/08/2018   Ovarian cancer (HCC) 2022   Ovarian mass, left 07/14/2020   Personal history of breast cancer    Personal history of chemotherapy current   bilateral ovarian ca   Pneumonia    Rheumatoid arthritis (HCC)    Sepsis (HCC) 04/26/2012   Sepsis secondary to UTI (HCC) 04/26/2012   Sleep apnea    Stroke [facility])    seen in CT scan   Thyroid  disease    Transient hypotension 06/14/2016    Past Surgical History: Past Surgical History:  Procedure Laterality Date   ABDOMINAL HYSTERECTOMY     BACK SURGERY     BREAST BIOPSY Right ?`   benign   BREAST LUMPECTOMY Left 2015   breast ca   CHOLECYSTECTOMY     JOINT REPLACEMENT     LAPAROTOMY N/A 07/14/2020   Procedure: LAPAROTOMY;  Surgeon: Arloa Lamar SQUIBB, MD;  Location: [facility] ORS;  Service: Gynecology;  Laterality: N/A;   OOPHORECTOMY     OVARIAN CYST REMOVAL     PORTA CATH INSERTION N/A 07/27/2020   Procedure: PORTA CATH INSERTION;  Surgeon: Marea Selinda RAMAN, MD;  Location: [facility] INVASIVE CV LAB;  Service: Cardiovascular;  Laterality: N/A;   PORTA CATH REMOVAL N/A 04/05/2021   Procedure: PORTA CATH REMOVAL;  Surgeon: Marea Selinda RAMAN, MD;  Location: [facility] INVASIVE CV LAB;  Service: Cardiovascular;  Laterality: N/A;   REPLACEMENT TOTAL KNEE Right    SALPINGOOPHORECTOMY Bilateral 07/14/2020   Procedure: OPEN SALPINGO OOPHORECTOMY;  Surgeon: Arloa Lamar SQUIBB, MD;  Location: [facility] ORS;  Service: Gynecology;  Laterality: Bilateral;   TOTAL SHOULDER REPLACEMENT Left    Past Gynecologic History:  Post menopausal  OB  History:  OB History  Gravida Para Term Preterm AB Living  2 2 2   2   SAB IAB Ectopic Multiple Live Births      2    # Outcome Date GA Lbr Len/2nd Weight Sex Type Anes PTL Lv  2 Term     F Vag-Spont   LIV  1 Term     F Vag-Spont   LIV     Complications: Breech delivery    Family History: Family History  Problem Relation Age of Onset   Hypertension Mother    Diabetes Daughter    Hypertension Daughter    Fibromyalgia Daughter    GER disease Daughter    Fibromyalgia Daughter    Crohn's disease Daughter    Asthma Daughter    Breast cancer Niece    Uterine cancer Niece    Breast cancer Other    Bladder Cancer Neg Hx    Renal cancer Neg Hx     Social History: Social History   Socioeconomic History   Marital status: Widowed    Spouse name: Not on file   Number of children: 2   Years of education: Not on file   Highest education level: Some college, no degree  Occupational History   Occupation: retired   Tobacco Use   Smoking status: Never   Smokeless tobacco: Never  Vaping Use   Vaping status: Never Used  Substance and Sexual Activity   Alcohol use: No   Drug use: No   Sexual activity: Not Currently    Partners: Male    Birth control/protection: Post-menopausal  Other Topics Concern   Not on file  Social History Narrative   Pt lives in 1 story home with her daughter, Luke and [name]'s husband   Has 2 adult daughters   Highest level of education: some college   Worked mainly as Environmental health practitioner.   Social Drivers of Corporate investment banker Strain: Low Risk  (11/23/2023)   Overall Financial Resource Strain (CARDIA)    Difficulty of Paying Living Expenses: Not hard at all  Food Insecurity: No Food Insecurity (11/23/2023)   Hunger Vital Sign    Worried About Running Out of Food in the Last Year: Never true    Ran Out of Food in the Last Year: Never true  Transportation Needs: No Transportation Needs (11/23/2023)   PRAPARE - Scientist, research (physical sciences) (Medical): No    Lack of Transportation (Non-Medical): No  Physical Activity: Inactive (11/23/2023)   Exercise Vital Sign    Days of Exercise per Week: 0 days    Minutes of Exercise per Session: Not on file  Stress: No Stress Concern Present (11/23/2023)   [employer] of Occupational Health - Occupational  Stress Questionnaire    Feeling of Stress: Not at all  Social Connections: Socially Isolated (11/23/2023)   Social Connection and Isolation Panel    Frequency of Communication with Friends and Family: More than three times a week    Frequency of Social Gatherings with Friends and Family: Once a week    Attends Religious Services: Never    Database administrator or Organizations: No    Attends Engineer, structural: Not on file    Marital Status: Widowed  Intimate Partner Violence: Not At Risk (08/07/2023)   Humiliation, Afraid, Rape, and Kick questionnaire    Fear of Current or Ex-Partner: No    Emotionally Abused: No    Physically Abused: No    Sexually Abused: No   Immunization History  Administered Date(s) Administered   Fluad Quad(high Dose 65+) 05/05/2020, 01/15/2021, 01/27/2022   Fluad Trivalent(High Dose 65+) 02/17/2023   INFLUENZA, HIGH DOSE SEASONAL PF 01/24/2024   Influenza,inj,Quad PF,6+ Mos 01/29/2018, 03/04/2019   PFIZER Comirnaty(Gray Top)Covid-19 Tri-Sucrose Vaccine 07/03/2020   PFIZER(Purple Top)SARS-COV-2 Vaccination 05/21/2019, 06/10/2019, 07/03/2020   PNEUMOCOCCAL CONJUGATE-20 01/24/2024   Pneumococcal Polysaccharide-23 06/20/2018   Tdap 12/09/2018   Zoster Recombinant(Shingrix) 03/06/2019    Allergies: No Known Allergies  Current Medications: Current Outpatient Medications  Medication Sig Dispense Refill   Accu-Chek FastClix Lancets MISC USE AS INSTRUCTED TO TEST BLOOD SUGAR DAILY 102 each 1   albuterol  (VENTOLIN  HFA) 108 (90 Base) MCG/ACT inhaler Inhale 1-2 puffs into the lungs every 6 (six) hours as needed. 18 g 0   Ascorbic  Acid (VITAMIN C ) 100 MG tablet Take 1 tablet (100 mg total) by mouth daily. 60 tablet 2   aspirin  EC 325 MG tablet Take 325 mg by mouth daily.     b complex vitamins capsule Take 1 capsule by mouth daily.     blood glucose meter kit and supplies Dispense based on patient and insurance preference. Use up to 2 times daily as directed. (FOR ICD-10 E10.9, E11.9). 1 each 0   Blood Glucose Monitoring Suppl DEVI 1 each by Does not apply route in the morning, at noon, and at bedtime. May substitute to any manufacturer covered by patient's insurance. 1 each 0   Cranberry 1000 MG CAPS Take 1 capsule by mouth 2 (two) times daily.     diclofenac  Sodium (VOLTAREN ) 1 % GEL Apply 2 g topically 3 (three) times daily as needed. For joint pain 100 g 2   donepezil  (ARICEPT ) 10 MG tablet Take 1 tablet daily 90 tablet 4   dorzolamide -timolol  (COSOPT ) 22.3-6.8 MG/ML ophthalmic solution Place 1 drop into both eyes 2 (two) times daily.     DULoxetine  (CYMBALTA ) 60 MG capsule Take 1 capsule (60 mg total) by mouth daily. For depression 90 capsule 2   estradiol  (ESTRACE ) 0.1 MG/GM vaginal cream Apply at bedtime, 1g 3 times a week on Monday, Wednesday, Friday. 42.5 g 3   fluticasone  (FLONASE ) 50 MCG/ACT nasal spray USE 2 SPRAYS IN EACH NOSTRIL DAILY AS NEEDED FOR ALLERGIES OR RHINITIS 48 g 2   gabapentin  (NEURONTIN ) 600 MG tablet Take 1 tablet (600 mg total) by mouth 2 (two) times daily. For neuropathy 180 tablet 2   Glucose Blood (BLOOD GLUCOSE TEST STRIPS) STRP 1 each by In Vitro route in the morning, at noon, and at bedtime. May substitute to any manufacturer covered by patient's insurance. 100 strip 5   Lancets Misc. MISC 1 each by Does not apply route in the morning, at noon, and  at bedtime. May substitute to any manufacturer covered by patient's insurance. 100 each 5   latanoprost  (XALATAN ) 0.005 % ophthalmic solution Place 1 drop into both eyes at bedtime.     levothyroxine  (SYNTHROID ) 112 MCG tablet TAKE 1 TABLET EVERY  MORNING MONDAY THROUGH SATURDAY; AND 1/2 TABLET ON SUNDAY. TAKE ON AN EMPTY STOMACH WITH WATER ONLY 78 tablet 2   meclizine  (ANTIVERT ) 25 MG tablet Take 1 tablet (25 mg total) by mouth 2 (two) times daily as needed for dizziness. 180 tablet 0   meloxicam  (MOBIC ) 15 MG tablet TAKE 1 TABLET EVERY DAY AS NEEDED FOR PAIN 90 tablet 1   memantine  (NAMENDA ) 5 MG tablet Take 1 tablet (5 mg total) by mouth 2 (two) times daily. 180 tablet 4   metFORMIN  (GLUCOPHAGE -XR) 500 MG 24 hr tablet Take 2 tablets by mouth in the morning and 1 tablet in the evening for diabetes. 270 tablet 1   methenamine  (HIPREX ) 1 g tablet Take 1 tablet (1 g total) by mouth 2 (two) times daily with a meal. 60 tablet 2   mirtazapine  (REMERON ) 15 MG tablet Take 1 tablet (15 mg total) by mouth at bedtime. For sleep 90 tablet 1   Netarsudil Dimesylate (RHOPRESSA) 0.02 % SOLN Place 1 drop into both eyes daily at 6 (six) AM.     nystatin  cream (MYCOSTATIN ) Apply 1 Application topically 2 (two) times daily as needed. 30 g 2   omeprazole  (PRILOSEC) 20 MG capsule TAKE 1 CAPSULE TWICE DAILY FOR HEARTBURN. 180 capsule 2   phenazopyridine  (PYRIDIUM ) 200 MG tablet Take 1 tablet (200 mg total) by mouth 3 (three) times daily as needed for pain. 10 tablet 0   pilocarpine  (PILOCAR) 2 % ophthalmic solution Place 1 drop into both eyes 4 (four) times daily.      propranolol  (INDERAL ) 80 MG tablet Take 1 tablet (80 mg total) by mouth daily. For headache prevention 90 tablet 0   rosuvastatin  (CRESTOR ) 10 MG tablet TAKE 1 TABLET EVERY DAY FOR CHOLESTEROL 90 tablet 2   SUMAtriptan  (IMITREX ) 25 MG tablet TAKE 1 TABLET AS DIRECTED EVERY 2 HOURS AS NEEDED FOR MIGRAINE. MAY REPEAT IN 2 HOURS IF HEADACHE PERSISTS OR RECURS. 9 tablet 11   triamcinolone  ointment (KENALOG ) 0.5 % Apply 1 Application topically 2 (two) times daily. 30 g 0   VITAMIN D  PO Take by mouth.     botulinum toxin Type A  (BOTOX ) 200 units injection INJECT 155 UNITS INTRAMUSCULARLY INTO MULTIPLE  SITES OF FACE, HEAD, AND NECK EVERY 90 DAYS (Patient not taking: Reported on 02/14/2024) 1 each 4   No current facility-administered medications for this visit.   Review of Systems General:  no complaints Skin: no complaints Eyes: no complaints HEENT: no complaints Breasts: no complaints Pulmonary: no complaints Cardiac: no complaints Gastrointestinal: no complaints Genitourinary/Sexual: no complaints Ob/Gyn: no complaints Musculoskeletal: no complaints Hematology: no complaints Neurologic/Psych: no complaints   Objective:  Physical Examination:  BP (!) 113/57   Pulse 72   Temp 98 F (36.7 C) (Tympanic)   Wt 158 lb (71.7 kg)   BMI 27.12 kg/m     ECOG Performance Status: 1 - Symptomatic but completely ambulatory  GENERAL: Patient is a well appearing female in no acute distress HEENT:  Sclera clear. Anicteric NODES:  Negative axillary, supraclavicular, inguinal lymph node survery LUNGS:  Clear to auscultation bilaterally.   HEART:  Regular rate and rhythm.  ABDOMEN:  Soft, nontender.  No hernias, incisions well healed. No masses or ascites EXTREMITIES:  No peripheral edema. Atraumatic. No cyanosis SKIN:  Clear with no obvious rashes or skin changes.  NEURO:  Nonfocal. Well oriented.  Appropriate affect.  Pelvic: Chaperoned by nursing EGBUS: no lesions Vagina- no discharge, bleeding, or lesions Cervix, Uterus, Ovaries: surgically absent  Rectovaginal: no masses  Lab Review CA125 ordered for today  Radiologic Imaging: No imaging on site today    Assessment:  Shannon Higgins is a 89 y.o. 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  Negative washings, omentum and abdominal survey as well as CT scan A/P. Finished four cycles of carbo/doxil  in 6/22.  Last two cycles omitted due to poor tolerance.  CA125 not elevated at diagnosis. Last CA125 in 5/25 was normal. No evidence of disease on exam today.  Clinically, asymptomatic.    Genetic testing with Myriad MyRisk negative  HRD testing not done.   History of breast cancer age 77.  Medical co-morbidities complicating care: AODM with PN, HTN, stroke. Plan:   Problem List Items Addressed This Visit       Endocrine   High grade ovarian cancer (HCC) - Primary    Will see her back for follow up in 12 months and she will see Dr Melanee in 6 months. Will monitor CA125, although not elevated at diagnosis.   Tinnie Dawn, DNP, AGNP-C, AOCNP [facility] [phone] (clinic)  I personally interviewed and examined the patient. Agreed with the above/below plan of care. I have directly contributed to assessment and plan of care of this patient and educated and discussed with patient and family.  Prentice Agent, MD

## 2024-02-14 NOTE — Progress Notes (Signed)
 Gynecologic Oncology Consult Visit   Referring Provider: Dr. 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 female, who returns to clinic for continued surveillance of high grade serous ovarian cancer s/p BSO, laparotomy, partial omentectomy with some spillage on 07/14/20, followed by 4 cycles of adjuvant carbo-doxil , completed 09/2020 (last 2 cycles omitted d/t poor tolerance). She returns to clinic for continued surveillance.   CA125 was not elevated at time of diagnosis but has been followed and has remained normal. Last checked May 2025 was 16.2. She is followed by neurology for mild dementia. She was seen by uro-gyn, Dr Guadlupe in August 2025 for recurrent UTI. She was started on methenamine  and topical estradiol .     Gyn Oncology History 04/24/20 admitted to [facility].  Presented to the ER with several day history of weakness, confusion, intermittent fever and thought to have UTI with sepsis.   04/23/20 - CT Abdomen Pelvis w contrast 13.5 cm x 11.8 cm x 15.2 cm simple, cystic appearing area is seen within the pelvis along the midline. This extends from the lower abdomen to the region just above the urinary bladder. Marked severity right-sided hydronephrosis and hydroureter with mild right perinephric inflammatory fat stranding and delayed right renal cortical enhancement. No obstructing renal calculi are identified. The dilated right ureter extends to the level of a large pelvic cyst   Seen by Dr Arloa for the large pelvic mass.  CA 125  22.9 HE4   99 Postmenopausal ROMA 2.65  Underwent Bilateral salpingo-oophorectomy, laparotomy, partial omentectomy with Dr. Arloa and Dr. Victor on 07/14/20.  Controlled drainage of the mass done with purse string with some spillage.  No disease seen outside ovary.    07/14/20  DIAGNOSIS:  A. OVARY AND FALLOPIAN TUBE, LEFT; SALPINGO-OOPHORECTOMY:  - HIGH-GRADE SEROUS CARCINOMA INVOLVING SEROUS  CYSTADENOMA OF OVARY, SEE SUMMARY BELOW.  - FALLOPIAN TUBE NEGATIVE FOR INVASIVE AND INTRAEPITHELIAL CARCINOMA.   B. OVARY AND FALLOPIAN TUBE, RIGHT; SALPINGO-OOPHORECTOMY:  - HIGH-GRADE SEROUS CARCINOMA, ONE 1.0 CM FOCUS, INVOLVING FRAGMENTED  OVARY.  - FALLOPIAN TUBE NEGATIVE FOR INVASIVE AND INTRAEPITHELIAL CARCINOMA.   C. OMENTUM; BIOPSY:  - NEGATIVE FOR MALIGNANCY.   CANCER CASE SUMMARY: OVARY or FALLOPIAN TUBE or PRIMARY PERITONEUM. Standard(s): AJCC-UICC 8, FIGO Cancer Report 2018   SPECIMEN  Procedure: Bilateral salpingo-oophorectomy and omental biopsy  Specimen Integrity:       Left ovary integrity: Capsule ruptured (intraoperative spillage of cyst contents)       Right ovary integrity: Fragmented  TUMOR  Tumor Site: Bilateral ovaries  Tumor Size: Greatest dimension: 4 cm  Histologic Type: High-grade serous carcinoma  Histologic Grade: High-grade  Ovarian Surface Involvement:       Left: Not identified       Right: cannot be determined due to fragmented specimen  Fallopian Tube Surface Involvement: Not identified  Other Tissue/ Organ Involvement: Not applicable  Largest Extrapelvic Peritoneal Focus: Not applicable  Peritoneal/Ascitic Fluid Involvement: Results pending, see [id number]  Chemotherapy Response Score (CRS): Not applicable   REGIONAL LYMPH NODES  Regional Lymph Nodes Status: Not applicable (no regional lymph nodes submitted)   DISTANT METASTASIS  Distant Site(s) Involved, if applicable: Not applicable   ADDENDUM:  Peritoneal/Ascitic Fluid Involvement:  Not identified.  [id number]: pelvic washings negative for malignancy   FINAL PATHOLOGIC STAGE CLASSIFICATION (pTNM, AJCC 8th Edition):  pT1c1 (surgical spill),  pN not assigned (no nodes submitted)  pM - Not applicable   CA  125  06/11/20 22.9  07/21/20 98.1 (post surgical) 11/24/20 20.3 03/31/21 14.7 09/20/21 15.1 03/28/22 17.1 07/20/22 16.1 01/20/23 15.7 06/28/23 21.7 09/13/23 16.2  Received  four cycles of carboplatin /Doxil  post op.  Did not get Taxol due to pre existing neuropathy. Dr. Melanee stopped treatment after 4 cycles due to poor tolerance.   Genetic testing Myriad MyRisk negative. HRD testing not done.   Problem List: Patient Active Problem List   Diagnosis Date Noted   Cerumen impaction 07/21/2023   SUI (stress urinary incontinence, female) 06/15/2023   Incontinence of feces 06/15/2023   SOB (shortness of breath) 01/30/2023   Pelvic pressure in female 11/08/2022   Chronic pain of both shoulders 08/24/2022   Weakness 01/27/2022   Personal history of breast cancer    Family history of breast cancer    High grade ovarian cancer (HCC) 07/21/2020   S/P bilateral oophorectomy 07/21/2020   Decreased mobility 09/02/2019   Preventative health care 03/04/2019   Insomnia 09/27/2018   History of TIA (transient ischemic attack) 09/13/2018   Recurrent UTI 06/20/2018   Frequent falls 05/14/2018   Gout 05/14/2018   Type 2 diabetes mellitus with peripheral neuropathy (HCC) 05/11/2018   Type 2 diabetes mellitus (HCC) 05/11/2018   History of breast cancer 10/17/2017   MDD (major depressive disorder), recurrent episode 06/21/2017   Chronic low back pain 01/13/2017   Hyperlipidemia 12/06/2016   Glaucoma 12/06/2016   Neuropathic pain 12/06/2016   Malignant melanoma of skin (HCC) 12/06/2016   Dementia arising in the senium and presenium (HCC) 12/06/2016   Chronic headache disorder 12/06/2016   Hypothyroidism 06/14/2016   Anemia 04/26/2012   Hypertensive disorder 04/26/2012   Gastroesophageal reflux disease 04/26/2012   Obstructive sleep apnea syndrome 04/26/2012    Past Medical History: Past Medical History:  Diagnosis Date   Acute bronchitis 05/13/2022   Acute metabolic encephalopathy 04/23/2020   Acute tubular injury of transplanted kidney 06/14/2016   AKI (acute kidney injury) 06/14/2016   Asthma    Breast cancer (HCC) 2015   left   Bronchitis    CAP (community  acquired pneumonia) 04/29/2012   Chronic sinusitis    Closed fracture of medial malleolus 05/11/2018   Community acquired pneumonia 04/29/2012   Confusion 12/06/2016   COVID-19 virus infection 12/17/2020   Diabetes mellitus without complication (HCC)    Family history of breast cancer    GERD (gastroesophageal reflux disease)    History of ankle fracture 05/14/2018   Last Assessment & Plan:  With a recent fall. Right medial malleous. Has ortho follow up soon, splinted now and tylenol  helps her pain   History of recurrent UTIs    Hypertension    Hypokalemia 04/26/2012   Hyponatremia    Hypothyroidism    Imbalance 09/02/2019   Insomnia    Migraine    Neuropathic pain    Orthostatic hypotension 05/08/2018   Ovarian cancer (HCC) 2022   Ovarian mass, left 07/14/2020   Personal history of breast cancer    Personal history of chemotherapy current   bilateral ovarian ca   Pneumonia    Rheumatoid arthritis (HCC)    Sepsis (HCC) 04/26/2012   Sepsis secondary to UTI (HCC) 04/26/2012   Sleep apnea    Stroke [facility])    seen in CT scan   Thyroid  disease    Transient hypotension 06/14/2016    Past Surgical History: Past Surgical History:  Procedure Laterality Date   ABDOMINAL HYSTERECTOMY     BACK SURGERY  BREAST BIOPSY Right ?`   benign   BREAST LUMPECTOMY Left 2015   breast ca   CHOLECYSTECTOMY     JOINT REPLACEMENT     LAPAROTOMY N/A 07/14/2020   Procedure: LAPAROTOMY;  Surgeon: Arloa Lamar SQUIBB, MD;  Location: [facility] ORS;  Service: Gynecology;  Laterality: N/A;   OOPHORECTOMY     OVARIAN CYST REMOVAL     PORTA CATH INSERTION N/A 07/27/2020   Procedure: PORTA CATH INSERTION;  Surgeon: Marea Selinda RAMAN, MD;  Location: [facility] INVASIVE CV LAB;  Service: Cardiovascular;  Laterality: N/A;   PORTA CATH REMOVAL N/A 04/05/2021   Procedure: PORTA CATH REMOVAL;  Surgeon: Marea Selinda RAMAN, MD;  Location: [facility] INVASIVE CV LAB;  Service: Cardiovascular;  Laterality: N/A;   REPLACEMENT TOTAL KNEE  Right    SALPINGOOPHORECTOMY Bilateral 07/14/2020   Procedure: OPEN SALPINGO OOPHORECTOMY;  Surgeon: Arloa Lamar SQUIBB, MD;  Location: [facility] ORS;  Service: Gynecology;  Laterality: Bilateral;   TOTAL SHOULDER REPLACEMENT Left    Past Gynecologic History:  Post menopausal  OB History:  OB History  Gravida Para Term Preterm AB Living  2 2 2   2   SAB IAB Ectopic Multiple Live Births      2    # Outcome Date GA Lbr Len/2nd Weight Sex Type Anes PTL Lv  2 Term     F Vag-Spont   LIV  1 Term     F Vag-Spont   LIV     Complications: Breech delivery    Family History: Family History  Problem Relation Age of Onset   Hypertension Mother    Diabetes Daughter    Hypertension Daughter    Fibromyalgia Daughter    GER disease Daughter    Fibromyalgia Daughter    Crohn's disease Daughter    Asthma Daughter    Breast cancer Niece    Uterine cancer Niece    Breast cancer Other    Bladder Cancer Neg Hx    Renal cancer Neg Hx     Social History: Social History   Socioeconomic History   Marital status: Widowed    Spouse name: Not on file   Number of children: 2   Years of education: Not on file   Highest education level: Some college, no degree  Occupational History   Occupation: retired   Tobacco Use   Smoking status: Never   Smokeless tobacco: Never  Vaping Use   Vaping status: Never Used  Substance and Sexual Activity   Alcohol use: No   Drug use: No   Sexual activity: Not Currently    Partners: Male    Birth control/protection: Post-menopausal  Other Topics Concern   Not on file  Social History Narrative   Pt lives in 1 story home with her daughter, Shannon Higgins   Has 2 adult daughters   Highest level of education: some college   Worked mainly as Environmental health practitioner.   Social Drivers of Corporate investment banker Strain: Low Risk  (11/23/2023)   Overall Financial Resource Strain (CARDIA)    Difficulty of Paying Living Expenses: Not hard at all  Food  Insecurity: No Food Insecurity (11/23/2023)   Hunger Vital Sign    Worried About Running Out of Food in the Last Year: Never true    Ran Out of Food in the Last Year: Never true  Transportation Needs: No Transportation Needs (11/23/2023)   PRAPARE - Transportation    Lack of Transportation (Medical): No    Lack  of Transportation (Non-Medical): No  Physical Activity: Inactive (11/23/2023)   Exercise Vital Sign    Days of Exercise per Week: 0 days    Minutes of Exercise per Session: Not on file  Stress: No Stress Concern Present (11/23/2023)   Shannon Higgins of Occupational Health - Occupational Stress Questionnaire    Feeling of Stress: Not at all  Social Connections: Socially Isolated (11/23/2023)   Social Connection and Isolation Panel    Frequency of Communication with Friends and Family: More than three times a week    Frequency of Social Gatherings with Friends and Family: Once a week    Attends Religious Services: Never    Database administrator or Organizations: No    Attends Engineer, structural: Not on file    Marital Status: Widowed  Intimate Partner Violence: Not At Risk (08/07/2023)   Humiliation, Afraid, Rape, and Kick questionnaire    Fear of Current or Ex-Partner: No    Emotionally Abused: No    Physically Abused: No    Sexually Abused: No   Immunization History  Administered Date(s) Administered   Fluad Quad(high Dose 65+) 05/05/2020, 01/15/2021, 01/27/2022   Fluad Trivalent(High Dose 65+) 02/17/2023   INFLUENZA, HIGH DOSE SEASONAL PF 01/24/2024   Influenza,inj,Quad PF,6+ Mos 01/29/2018, 03/04/2019   PFIZER Comirnaty(Gray Top)Covid-19 Tri-Sucrose Vaccine 07/03/2020   PFIZER(Purple Top)SARS-COV-2 Vaccination 05/21/2019, 06/10/2019, 07/03/2020   PNEUMOCOCCAL CONJUGATE-20 01/24/2024   Pneumococcal Polysaccharide-23 06/20/2018   Tdap 12/09/2018   Zoster Recombinant(Shingrix) 03/06/2019    Allergies: No Known Allergies  Current Medications: Current  Outpatient Medications  Medication Sig Dispense Refill   Accu-Chek FastClix Lancets MISC USE AS INSTRUCTED TO TEST BLOOD SUGAR DAILY 102 each 1   albuterol  (VENTOLIN  HFA) 108 (90 Base) MCG/ACT inhaler Inhale 1-2 puffs into the lungs every 6 (six) hours as needed. 18 g 0   Ascorbic Acid (VITAMIN C ) 100 MG tablet Take 1 tablet (100 mg total) by mouth daily. 60 tablet 2   aspirin  EC 325 MG tablet Take 325 mg by mouth daily.     b complex vitamins capsule Take 1 capsule by mouth daily.     blood glucose meter kit and supplies Dispense based on patient and insurance preference. Use up to 2 times daily as directed. (FOR ICD-10 E10.9, E11.9). 1 each 0   Blood Glucose Monitoring Suppl DEVI 1 each by Does not apply route in the morning, at noon, and at bedtime. May substitute to any manufacturer covered by patient's insurance. 1 each 0   Cranberry 1000 MG CAPS Take 1 capsule by mouth 2 (two) times daily.     diclofenac  Sodium (VOLTAREN ) 1 % GEL Apply 2 g topically 3 (three) times daily as needed. For joint pain 100 g 2   donepezil  (ARICEPT ) 10 MG tablet Take 1 tablet daily 90 tablet 4   dorzolamide -timolol  (COSOPT ) 22.3-6.8 MG/ML ophthalmic solution Place 1 drop into both eyes 2 (two) times daily.     DULoxetine  (CYMBALTA ) 60 MG capsule Take 1 capsule (60 mg total) by mouth daily. For depression 90 capsule 2   estradiol  (ESTRACE ) 0.1 MG/GM vaginal cream Apply at bedtime, 1g 3 times a week on Monday, Wednesday, Friday. 42.5 g 3   fluticasone  (FLONASE ) 50 MCG/ACT nasal spray USE 2 SPRAYS IN EACH NOSTRIL DAILY AS NEEDED FOR ALLERGIES OR RHINITIS 48 g 2   gabapentin  (NEURONTIN ) 600 MG tablet Take 1 tablet (600 mg total) by mouth 2 (two) times daily. For neuropathy 180 tablet 2  Glucose Blood (BLOOD GLUCOSE TEST STRIPS) STRP 1 each by In Vitro route in the morning, at noon, and at bedtime. May substitute to any manufacturer covered by patient's insurance. 100 strip 5   Lancets Misc. MISC 1 each by Does not  apply route in the morning, at noon, and at bedtime. May substitute to any manufacturer covered by patient's insurance. 100 each 5   latanoprost  (XALATAN ) 0.005 % ophthalmic solution Place 1 drop into both eyes at bedtime.     levothyroxine  (SYNTHROID ) 112 MCG tablet TAKE 1 TABLET EVERY MORNING MONDAY THROUGH SATURDAY; AND 1/2 TABLET ON SUNDAY. TAKE ON AN EMPTY STOMACH WITH WATER ONLY 78 tablet 2   meclizine  (ANTIVERT ) 25 MG tablet Take 1 tablet (25 mg total) by mouth 2 (two) times daily as needed for dizziness. 180 tablet 0   meloxicam  (MOBIC ) 15 MG tablet TAKE 1 TABLET EVERY DAY AS NEEDED FOR PAIN 90 tablet 1   memantine  (NAMENDA ) 5 MG tablet Take 1 tablet (5 mg total) by mouth 2 (two) times daily. 180 tablet 4   metFORMIN  (GLUCOPHAGE -XR) 500 MG 24 hr tablet Take 2 tablets by mouth in the morning and 1 tablet in the evening for diabetes. 270 tablet 1   methenamine  (HIPREX ) 1 g tablet Take 1 tablet (1 g total) by mouth 2 (two) times daily with a meal. 60 tablet 2   mirtazapine  (REMERON ) 15 MG tablet Take 1 tablet (15 mg total) by mouth at bedtime. For sleep 90 tablet 1   Netarsudil Dimesylate (RHOPRESSA) 0.02 % SOLN Place 1 drop into both eyes daily at 6 (six) AM.     nystatin  cream (MYCOSTATIN ) Apply 1 Application topically 2 (two) times daily as needed. 30 g 2   omeprazole  (PRILOSEC) 20 MG capsule TAKE 1 CAPSULE TWICE DAILY FOR HEARTBURN. 180 capsule 2   phenazopyridine  (PYRIDIUM ) 200 MG tablet Take 1 tablet (200 mg total) by mouth 3 (three) times daily as needed for pain. 10 tablet 0   pilocarpine  (PILOCAR) 2 % ophthalmic solution Place 1 drop into both eyes 4 (four) times daily.      propranolol  (INDERAL ) 80 MG tablet Take 1 tablet (80 mg total) by mouth daily. For headache prevention 90 tablet 0   rosuvastatin  (CRESTOR ) 10 MG tablet TAKE 1 TABLET EVERY DAY FOR CHOLESTEROL 90 tablet 2   SUMAtriptan  (IMITREX ) 25 MG tablet TAKE 1 TABLET AS DIRECTED EVERY 2 HOURS AS NEEDED FOR MIGRAINE. MAY REPEAT  IN 2 HOURS IF HEADACHE PERSISTS OR RECURS. 9 tablet 11   triamcinolone  ointment (KENALOG ) 0.5 % Apply 1 Application topically 2 (two) times daily. 30 g 0   VITAMIN D  PO Take by mouth.     botulinum toxin Type A  (BOTOX ) 200 units injection INJECT 155 UNITS INTRAMUSCULARLY INTO MULTIPLE SITES OF FACE, HEAD, AND NECK EVERY 90 DAYS (Patient not taking: Reported on 02/14/2024) 1 each 4   No current facility-administered medications for this visit.   Review of Systems General:  fatigue Skin: no complaints Eyes: no complaints HEENT: no complaints Breasts: no complaints Pulmonary: no complaints Cardiac: no complaints Gastrointestinal: no complaints Genitourinary/Sexual: no complaints Ob/Gyn: no complaints Musculoskeletal: no complaints Hematology: no complaints Neurologic/Psych: mild dementia   Objective:  Physical Examination:  BP (!) 113/57   Pulse 72   Temp 98 F (36.7 C) (Tympanic)   Wt 158 lb (71.7 kg)   BMI 27.12 kg/m     ECOG Performance Status: 1 - Symptomatic but completely ambulatory  GENERAL: Patient is a well  appearing female in no acute distress HEENT:  Sclera clear. Anicteric NODES:  Negative axillary, supraclavicular, inguinal lymph node survery LUNGS:  Clear to auscultation bilaterally.   HEART:  Regular rate and rhythm.  ABDOMEN:  Soft, nontender.  No hernias, incisions well healed. No masses or ascites EXTREMITIES:  No peripheral edema. Atraumatic. No cyanosis SKIN:  Clear with no obvious rashes or skin changes.  NEURO:  Nonfocal. Well oriented.  Appropriate affect.  Pelvic: Chaperoned by NP EGBUS: no lesions Vagina- no discharge, bleeding, or lesions Cervix, Uterus, Ovaries: surgically absent  Rectovaginal: no masses  Lab Review CA125 ordered for today  Radiologic Imaging: No imaging on site today    Assessment:  BRENAE LASECKI is a 89 y.o. 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   Negative washings, omentum and abdominal survey as well as CT scan A/P. Finished four cycles of carbo/doxil  in 6/22.  Last two cycles omitted due to poor tolerance.  CA125 not elevated at diagnosis. Last CA125 in 9/24 was normal. No evidence of disease on exam today. Clinically, asymptomatic.    Genetic testing with Myriad MyRisk negative  HRD testing not done.   History of breast cancer age 71.  Medical co-morbidities complicating care: AODM with PN, HTN, stroke, mild dementia, falls/frailty Plan:   Problem List Items Addressed This Visit       Endocrine   High grade ovarian cancer (HCC) - Primary    Will see her back for follow up in 12 months and she will see Dr Melanee in 6 months.   Tinnie Dawn, DNP, AGNP-C, AOCNP [facility] [phone] (clinic)  I personally had a face to face interaction and evaluated the patient jointly with the NP, Ms. Tinnie Dawn.  I have reviewed her history and available records and have performed the key portions of the physical exam including lymph node survey, abdominal exam, pelvic exam with my findings confirming those documented above by the APP.  I have discussed the case with the APP and the patient.  I agree with the above documentation, assessment and plan which was fully formulated by me.  Counseling was completed by me.   I personally saw the patient and performed a substantive portion of this encounter in conjunction with the listed APP as documented above.  Prentice Agent, MD

## 2024-02-15 LAB — CA 125: Cancer Antigen (CA) 125: 15.8 U/mL (ref 0.0–38.1)

## 2024-02-20 DIAGNOSIS — H04122 Dry eye syndrome of left lacrimal gland: Secondary | ICD-10-CM | POA: Diagnosis not present

## 2024-02-20 DIAGNOSIS — H401133 Primary open-angle glaucoma, bilateral, severe stage: Secondary | ICD-10-CM | POA: Diagnosis not present

## 2024-02-29 ENCOUNTER — Telehealth: Payer: Self-pay

## 2024-02-29 NOTE — Telephone Encounter (Addendum)
 PA approval letter received today.  Botox  approved 05/02/24-05/01/25.  Mzq#[id number]

## 2024-03-14 ENCOUNTER — Other Ambulatory Visit: Payer: Self-pay | Admitting: Primary Care

## 2024-03-14 DIAGNOSIS — G8929 Other chronic pain: Secondary | ICD-10-CM

## 2024-03-19 ENCOUNTER — Encounter: Payer: Self-pay | Admitting: Neurology

## 2024-03-19 DIAGNOSIS — G8929 Other chronic pain: Secondary | ICD-10-CM

## 2024-03-19 DIAGNOSIS — R296 Repeated falls: Secondary | ICD-10-CM

## 2024-03-19 DIAGNOSIS — E1142 Type 2 diabetes mellitus with diabetic polyneuropathy: Secondary | ICD-10-CM

## 2024-03-19 DIAGNOSIS — C569 Malignant neoplasm of unspecified ovary: Secondary | ICD-10-CM

## 2024-03-19 DIAGNOSIS — R42 Dizziness and giddiness: Secondary | ICD-10-CM

## 2024-03-19 DIAGNOSIS — R531 Weakness: Secondary | ICD-10-CM

## 2024-03-20 NOTE — Telephone Encounter (Signed)
 DME order placed for rollator walker.  Form signed and placed in Kelli's inbox.  Can we please contact patient's daughter to let her know where to pick up the walker?

## 2024-03-22 ENCOUNTER — Telehealth: Payer: Self-pay | Admitting: *Deleted

## 2024-03-22 MED ORDER — METHENAMINE HIPPURATE 1 G PO TABS: 1.0000 g | ORAL_TABLET | Freq: Two times a day (BID) | ORAL | 0 refills | Status: DC

## 2024-03-22 NOTE — Telephone Encounter (Signed)
 TC from Pt's daughter about refill Methenamine  Hippurate.  Going out of town on Sunday and is in need of medication. The medication follow up visit has been cancelled.  I advised in VM that visit needs to be rescheduled. A 30 day RX will be sent to [facility] per their request.  Sotero CMA

## 2024-03-24 ENCOUNTER — Other Ambulatory Visit: Payer: Self-pay | Admitting: Obstetrics

## 2024-03-24 DIAGNOSIS — N390 Urinary tract infection, site not specified: Secondary | ICD-10-CM

## 2024-03-25 ENCOUNTER — Ambulatory Visit: Admitting: Obstetrics

## 2024-04-01 DIAGNOSIS — S8002XA Contusion of left knee, initial encounter: Secondary | ICD-10-CM | POA: Diagnosis not present

## 2024-04-01 DIAGNOSIS — Z9181 History of falling: Secondary | ICD-10-CM | POA: Diagnosis not present

## 2024-04-01 DIAGNOSIS — J069 Acute upper respiratory infection, unspecified: Secondary | ICD-10-CM | POA: Diagnosis not present

## 2024-04-01 DIAGNOSIS — R070 Pain in throat: Secondary | ICD-10-CM | POA: Diagnosis not present

## 2024-04-01 DIAGNOSIS — R059 Cough, unspecified: Secondary | ICD-10-CM | POA: Diagnosis not present

## 2024-04-06 ENCOUNTER — Other Ambulatory Visit: Payer: Self-pay | Admitting: Neurology

## 2024-04-17 ENCOUNTER — Other Ambulatory Visit: Payer: Self-pay | Admitting: Primary Care

## 2024-04-17 DIAGNOSIS — E785 Hyperlipidemia, unspecified: Secondary | ICD-10-CM

## 2024-04-17 DIAGNOSIS — K219 Gastro-esophageal reflux disease without esophagitis: Secondary | ICD-10-CM

## 2024-04-17 DIAGNOSIS — E039 Hypothyroidism, unspecified: Secondary | ICD-10-CM

## 2024-04-17 DIAGNOSIS — F331 Major depressive disorder, recurrent, moderate: Secondary | ICD-10-CM

## 2024-04-17 DIAGNOSIS — M545 Low back pain, unspecified: Secondary | ICD-10-CM

## 2024-04-23 ENCOUNTER — Ambulatory Visit: Admitting: Primary Care

## 2024-04-30 ENCOUNTER — Ambulatory Visit (INDEPENDENT_AMBULATORY_CARE_PROVIDER_SITE_OTHER): Admitting: Primary Care

## 2024-04-30 VITALS — BP 132/80 | HR 80 | Temp 97.9°F | Ht 64.0 in | Wt 160.0 lb

## 2024-04-30 DIAGNOSIS — E1142 Type 2 diabetes mellitus with diabetic polyneuropathy: Secondary | ICD-10-CM | POA: Diagnosis not present

## 2024-04-30 DIAGNOSIS — Z7984 Long term (current) use of oral hypoglycemic drugs: Secondary | ICD-10-CM | POA: Diagnosis not present

## 2024-04-30 DIAGNOSIS — E1165 Type 2 diabetes mellitus with hyperglycemia: Secondary | ICD-10-CM | POA: Diagnosis not present

## 2024-04-30 LAB — POCT GLYCOSYLATED HEMOGLOBIN (HGB A1C): Hemoglobin A1C: 7.2 % — AB (ref 4.0–5.6)

## 2024-04-30 NOTE — Assessment & Plan Note (Signed)
 Improved and controlled with A1c of 7.2 today!  Given her frailty and medical history and A1c goal of less than 8.0 is acceptable.  Continue metformin  ER 1000 mg in a.m. and 500 mg in p.m. Repeat urine microalbumin pending.  Follow-up in April 2026

## 2024-04-30 NOTE — Progress Notes (Signed)
 "  Subjective:    Patient ID: Shannon Higgins, female    DOB: 06-Aug-1934, 89 y.o.   MRN: [id number]  Shannon Higgins is a very pleasant 89 y.o. female with a history of hypertension, OSA, type 2 diabetes, hypothyroidism, breast cancer, ovarian cancer who presents today for follow-up diabetes.  1) Type 2 Diabetes:  Current medications include: Metformin  ER 1000 mg in a.m. and 500 mg in p.m.  She is checking her blood glucose 0 times daily.  Last A1C: 8.9 in September 2025, 7.2 today Last Eye Exam: UTD Last Foot Exam: Up-to-date Pneumonia Vaccination: 2025 Urine Microalbumin: Up-to-date Statin: Rosuvastatin   Dietary changes since last visit: Eating lots of sweets and snack cakes.    Exercise: None  BP Readings from Last 3 Encounters:  04/30/24 132/80  02/14/24 (!) 113/57  01/24/24 128/84       Review of Systems  Respiratory:  Negative for shortness of breath.   Cardiovascular:  Negative for chest pain.  Neurological:  Positive for numbness. Negative for dizziness.         Past Medical History:  Diagnosis Date   Acute bronchitis 05/13/2022   Acute metabolic encephalopathy 04/23/2020   Acute tubular injury of transplanted kidney 06/14/2016   AKI (acute kidney injury) 06/14/2016   Asthma    Breast cancer (HCC) 2015   left   Bronchitis    CAP (community acquired pneumonia) 04/29/2012   Chronic sinusitis    Closed fracture of medial malleolus 05/11/2018   Community acquired pneumonia 04/29/2012   Confusion 12/06/2016   COVID-19 virus infection 12/17/2020   Diabetes mellitus without complication (HCC)    Family history of breast cancer    GERD (gastroesophageal reflux disease)    History of ankle fracture 05/14/2018   Last Assessment & Plan:  With a recent fall. Right medial malleous. Has ortho follow up soon, splinted now and tylenol  helps her pain   History of recurrent UTIs    Hypertension    Hypokalemia 04/26/2012   Hyponatremia    Hypothyroidism     Imbalance 09/02/2019   Insomnia    Migraine    Neuropathic pain    Orthostatic hypotension 05/08/2018   Ovarian cancer (HCC) 2022   Ovarian mass, left 07/14/2020   Personal history of breast cancer    Personal history of chemotherapy current   bilateral ovarian ca   Pneumonia    Rheumatoid arthritis (HCC)    Sepsis (HCC) 04/26/2012   Sepsis secondary to UTI (HCC) 04/26/2012   Sleep apnea    Stroke [facility])    seen in CT scan   Thyroid  disease    Transient hypotension 06/14/2016    Social History   Socioeconomic History   Marital status: Widowed    Spouse name: Not on file   Number of children: 2   Years of education: Not on file   Highest education level: Some college, no degree  Occupational History   Occupation: retired   Tobacco Use   Smoking status: Never   Smokeless tobacco: Never  Vaping Use   Vaping status: Never Used  Substance and Sexual Activity   Alcohol use: No   Drug use: No   Sexual activity: Not Currently    Partners: Male    Birth control/protection: Post-menopausal  Other Topics Concern   Not on file  Social History Narrative   Pt lives in 1 story home with her daughter, Luke and [name]'s husband   Has 2 adult daughters   Highest level  of education: some college   Worked mainly as environmental health practitioner.   Social Drivers of Health   Tobacco Use: Low Risk (04/30/2024)   Patient History    Smoking Tobacco Use: Never    Smokeless Tobacco Use: Never    Passive Exposure: Not on file  Financial Resource Strain: Low Risk (04/30/2024)   Overall Financial Resource Strain (CARDIA)    Difficulty of Paying Living Expenses: Not hard at all  Food Insecurity: No Food Insecurity (04/30/2024)   Epic    Worried About Programme Researcher, Broadcasting/film/video in the Last Year: Never true    Ran Out of Food in the Last Year: Never true  Transportation Needs: No Transportation Needs (04/30/2024)   Epic    Lack of Transportation (Medical): No    Lack of Transportation  (Non-Medical): No  Physical Activity: Inactive (04/30/2024)   Exercise Vital Sign    Days of Exercise per Week: 0 days    Minutes of Exercise per Session: Not on file  Stress: No Stress Concern Present (04/30/2024)   [employer] of Occupational Health - Occupational Stress Questionnaire    Feeling of Stress: Not at all  Social Connections: Moderately Isolated (04/30/2024)   Social Connection and Isolation Panel    Frequency of Communication with Friends and Family: Three times a week    Frequency of Social Gatherings with Friends and Family: More than three times a week    Attends Religious Services: 1 to 4 times per year    Active Member of [employer] or Organizations: No    Attends Banker Meetings: Not on file    Marital Status: Widowed  Intimate Partner Violence: Not At Risk (08/07/2023)   Humiliation, Afraid, Rape, and Kick questionnaire    Fear of Current or Ex-Partner: No    Emotionally Abused: No    Physically Abused: No    Sexually Abused: No  Depression (PHQ2-9): Low Risk (04/30/2024)   Depression (PHQ2-9)    PHQ-2 Score: 1  Alcohol Screen: Low Risk (07/20/2023)   Alcohol Screen    Last Alcohol Screening Score (AUDIT): 0  Housing: Low Risk (04/30/2024)   Epic    Unable to Pay for Housing in the Last Year: No    Number of Times Moved in the Last Year: 0    Homeless in the Last Year: No  Utilities: Not At Risk (08/07/2023)   [employer] Utilities    Threatened with loss of utilities: No  Health Literacy: Adequate Health Literacy (07/20/2023)   B1300 Health Literacy    Frequency of need for help with medical instructions: Never    Past Surgical History:  Procedure Laterality Date   ABDOMINAL HYSTERECTOMY     BACK SURGERY     BREAST BIOPSY Right ?`   benign   BREAST LUMPECTOMY Left 2015   breast ca   CHOLECYSTECTOMY     JOINT REPLACEMENT     LAPAROTOMY N/A 07/14/2020   Procedure: LAPAROTOMY;  Surgeon: Arloa Lamar SQUIBB, MD;  Location: [facility] ORS;  Service:  Gynecology;  Laterality: N/A;   OOPHORECTOMY     OVARIAN CYST REMOVAL     PORTA CATH INSERTION N/A 07/27/2020   Procedure: PORTA CATH INSERTION;  Surgeon: Marea Selinda RAMAN, MD;  Location: [facility];  Service: Cardiovascular;  Laterality: N/A;   PORTA CATH REMOVAL N/A 04/05/2021   Procedure: PORTA CATH REMOVAL;  Surgeon: Marea Selinda RAMAN, MD;  Location: [facility];  Service: Cardiovascular;  Laterality: N/A;   REPLACEMENT  TOTAL KNEE Right    SALPINGOOPHORECTOMY Bilateral 07/14/2020   Procedure: OPEN SALPINGO OOPHORECTOMY;  Surgeon: Arloa Lamar SQUIBB, MD;  Location: [facility] OR[facility];  Service: Gynecology;  Laterality: Bilateral;   TOTAL SHOULDER REPLACEMENT Left     Family History  Problem Relation Age of Onset   Hypertension Mother    Diabetes Daughter    Hypertension Daughter    Fibromyalgia Daughter    GER disease Daughter    Fibromyalgia Daughter    Crohn's disease Daughter    Asthma Daughter    Breast cancer Niece    Uterine cancer Niece    Breast cancer Other    Bladder Cancer Neg Hx    Renal cancer Neg Hx     Allergies[1]  Medications Ordered Prior to Encounter[2]  BP 132/80   Pulse 80   Temp 97.9 F (36.6 C) (Oral)   Ht 5' 4 (1.626 m)   Wt 160 lb (72.6 kg)   SpO2 96%   BMI 27.46 kg/m  Objective:   Physical Exam Cardiovascular:     Rate and Rhythm: Normal rate and regular rhythm.  Pulmonary:     Effort: Pulmonary effort is normal.     Breath sounds: Normal breath sounds.  Musculoskeletal:     Cervical back: Neck supple.  Skin:    General: Skin is warm and dry.  Neurological:     Mental Status: She is alert and oriented to person, place, and time.  Psychiatric:        Mood and Affect: Mood normal.     Physical Exam        Assessment & Plan:  Type 2 diabetes mellitus with hyperglycemia, without long-term current use of insulin  (HCC) -     POCT glycosylated hemoglobin (Hb A1C) -     Microalbumin / creatinine urine ratio  Type 2 diabetes  mellitus with peripheral neuropathy (HCC) Assessment & Plan: Improved and controlled with A1c of 7.2 today!  Given her frailty and medical history and A1c goal of less than 8.0 is acceptable.  Continue metformin  ER 1000 mg in a.m. and 500 mg in p.m. Repeat urine microalbumin pending.  Follow-up in April 2026      Assessment and Plan Assessment & Plan         Comer MARLA Gaskins, NP       [1] No Known Allergies [2]  Current Outpatient Medications on File Prior to Visit  Medication Sig Dispense Refill   Accu-Chek FastClix Lancets MISC USE AS INSTRUCTED TO TEST BLOOD SUGAR DAILY 102 each 1   albuterol  (VENTOLIN  HFA) 108 (90 Base) MCG/ACT inhaler Inhale 1-2 puffs into the lungs every 6 (six) hours as needed. 18 g 0   Ascorbic Acid (VITAMIN C ) 100 MG tablet Take 1 tablet (100 mg total) by mouth daily. 60 tablet 2   aspirin  EC 325 MG tablet Take 325 mg by mouth daily.     b complex vitamins capsule Take 1 capsule by mouth daily.     blood glucose meter kit and supplies Dispense based on patient and insurance preference. Use up to 2 times daily as directed. (FOR ICD-10 E10.9, E11.9). 1 each 0   Blood Glucose Monitoring Suppl DEVI 1 each by Does not apply route in the morning, at noon, and at bedtime. May substitute to any manufacturer covered by patient's insurance. 1 each 0   botulinum toxin Type A  (BOTOX ) 200 units injection INJECT 155 UNITS INTRAMUSCULARLY INTO MULTIPLE SITES OF FACE, HEAD, AND NECK EVERY 90  DAYS 1 each 4   Cranberry 1000 MG CAPS Take 1 capsule by mouth 2 (two) times daily.     diclofenac  Sodium (VOLTAREN ) 1 % GEL Apply 2 g topically 3 (three) times daily as needed. For joint pain 100 g 2   donepezil  (ARICEPT ) 10 MG tablet Take 1 tablet daily 90 tablet 4   dorzolamide -timolol  (COSOPT ) 22.3-6.8 MG/ML ophthalmic solution Place 1 drop into both eyes 2 (two) times daily.     DULoxetine  (CYMBALTA ) 60 MG capsule Take 1 capsule (60 mg total) by mouth daily. For  depression 90 capsule 2   estradiol  (ESTRACE ) 0.1 MG/GM vaginal cream Apply at bedtime, 1g 3 times a week on Monday, Wednesday, Friday. 42.5 g 3   fluticasone  (FLONASE ) 50 MCG/ACT nasal spray USE 2 SPRAYS IN EACH NOSTRIL DAILY AS NEEDED FOR ALLERGIES OR RHINITIS 48 g 2   gabapentin  (NEURONTIN ) 600 MG tablet Take 1 tablet (600 mg total) by mouth 2 (two) times daily. For neuropathy 180 tablet 2   Glucose Blood (BLOOD GLUCOSE TEST STRIPS) STRP 1 each by In Vitro route in the morning, at noon, and at bedtime. May substitute to any manufacturer covered by patient's insurance. 100 strip 5   Lancets Misc. MISC 1 each by Does not apply route in the morning, at noon, and at bedtime. May substitute to any manufacturer covered by patient's insurance. 100 each 5   latanoprost  (XALATAN ) 0.005 % ophthalmic solution Place 1 drop into both eyes at bedtime.     levothyroxine  (SYNTHROID ) 112 MCG tablet TAKE 1 TABLET EVERY MORNING MONDAY THROUGH SATURDAY; AND 1/2 TABLET ON SUNDAY. TAKE ON AN EMPTY STOMACH WITH WATER ONLY 78 tablet 2   meclizine  (ANTIVERT ) 25 MG tablet Take 1 tablet (25 mg total) by mouth 2 (two) times daily as needed for dizziness. 180 tablet 0   meloxicam  (MOBIC ) 15 MG tablet TAKE 1 TABLET EVERY DAY AS NEEDED FOR PAIN 90 tablet 0   memantine  (NAMENDA ) 5 MG tablet Take 1 tablet (5 mg total) by mouth 2 (two) times daily. 180 tablet 4   metFORMIN  (GLUCOPHAGE -XR) 500 MG 24 hr tablet Take 2 tablets by mouth in the morning and 1 tablet in the evening for diabetes. 270 tablet 1   methenamine  (HIPREX ) 1 g tablet TAKE 1 TABLET (1 G TOTAL) BY MOUTH 2 (TWO) TIMES DAILY WITH A MEAL. 180 tablet 0   mirtazapine  (REMERON ) 15 MG tablet Take 1 tablet (15 mg total) by mouth at bedtime. For sleep 90 tablet 1   Netarsudil Dimesylate (RHOPRESSA) 0.02 % SOLN Place 1 drop into both eyes daily at 6 (six) AM.     nystatin  cream (MYCOSTATIN ) Apply 1 Application topically 2 (two) times daily as needed. 30 g 2   omeprazole   (PRILOSEC) 20 MG capsule TAKE 1 CAPSULE TWICE DAILY FOR HEARTBURN. 180 capsule 2   phenazopyridine  (PYRIDIUM ) 200 MG tablet Take 1 tablet (200 mg total) by mouth 3 (three) times daily as needed for pain. 10 tablet 0   pilocarpine  (PILOCAR) 2 % ophthalmic solution Place 1 drop into both eyes 4 (four) times daily.      propranolol  (INDERAL ) 80 MG tablet Take 1 tablet (80 mg total) by mouth daily. For headache prevention 90 tablet 0   rosuvastatin  (CRESTOR ) 10 MG tablet TAKE 1 TABLET EVERY DAY FOR CHOLESTEROL 90 tablet 2   SUMAtriptan  (IMITREX ) 25 MG tablet TAKE 1 TABLET AS DIRECTED EVERY 2 HOURS AS NEEDED FOR MIGRAINE. MAY REPEAT IN 2 HOURS IF HEADACHE PERSISTS  OR RECURS. 9 tablet 8   triamcinolone  ointment (KENALOG ) 0.5 % Apply 1 Application topically 2 (two) times daily. 30 g 0   VITAMIN D  PO Take by mouth.     No current facility-administered medications on file prior to visit.   "

## 2024-04-30 NOTE — Patient Instructions (Signed)
 Stop by the lab prior to leaving today. I will notify you of your results once received.   Please schedule a physical to meet with me in 3 months.   It was a pleasure to see you today!

## 2024-05-01 ENCOUNTER — Other Ambulatory Visit: Payer: Self-pay

## 2024-05-01 ENCOUNTER — Ambulatory Visit: Payer: Self-pay | Admitting: Primary Care

## 2024-05-01 DIAGNOSIS — N390 Urinary tract infection, site not specified: Secondary | ICD-10-CM

## 2024-05-01 DIAGNOSIS — E1142 Type 2 diabetes mellitus with diabetic polyneuropathy: Secondary | ICD-10-CM

## 2024-05-01 LAB — MICROALBUMIN / CREATININE URINE RATIO
Creatinine,U: 163.4 mg/dL
Microalb Creat Ratio: 275.4 mg/g — ABNORMAL HIGH (ref 0.0–30.0)
Microalb, Ur: 45 mg/dL — ABNORMAL HIGH (ref 0.7–1.9)

## 2024-05-01 MED ORDER — METHENAMINE HIPPURATE 1 G PO TABS: 1.0000 g | ORAL_TABLET | Freq: Two times a day (BID) | ORAL | 0 refills | Status: DC

## 2024-05-04 DIAGNOSIS — E1165 Type 2 diabetes mellitus with hyperglycemia: Secondary | ICD-10-CM

## 2024-05-06 MED ORDER — METHENAMINE HIPPURATE 1 G PO TABS: 1.0000 g | ORAL_TABLET | Freq: Two times a day (BID) | ORAL | 0 refills | Status: AC

## 2024-05-06 NOTE — Addendum Note (Signed)
 Addended by: Golden Gilreath, HYDE T on: 05/06/2024 09:09 AM   Modules accepted: Orders

## 2024-05-07 MED ORDER — METFORMIN HCL ER 500 MG PO TB24: 500.0000 mg | ORAL_TABLET | Freq: Two times a day (BID) | ORAL | Status: AC

## 2024-05-07 MED ORDER — EMPAGLIFLOZIN 10 MG PO TABS: 10.0000 mg | ORAL_TABLET | Freq: Every day | ORAL | 1 refills | Status: AC

## 2024-05-13 ENCOUNTER — Encounter: Payer: Self-pay | Admitting: *Deleted

## 2024-05-15 ENCOUNTER — Inpatient Hospital Stay: Attending: Primary Care

## 2024-05-15 ENCOUNTER — Inpatient Hospital Stay (HOSPITAL_BASED_OUTPATIENT_CLINIC_OR_DEPARTMENT_OTHER): Admitting: Oncology

## 2024-05-15 ENCOUNTER — Encounter: Payer: Self-pay | Admitting: Oncology

## 2024-05-15 VITALS — BP 126/32 | HR 58 | Temp 97.6°F | Resp 19 | Ht 64.0 in | Wt 158.2 lb

## 2024-05-15 DIAGNOSIS — Z8543 Personal history of malignant neoplasm of ovary: Secondary | ICD-10-CM

## 2024-05-15 DIAGNOSIS — Z08 Encounter for follow-up examination after completed treatment for malignant neoplasm: Secondary | ICD-10-CM

## 2024-05-15 LAB — CMP (CANCER CENTER ONLY)
ALT: 24 U/L (ref 0–44)
AST: 39 U/L (ref 15–41)
Albumin: 4.8 g/dL (ref 3.5–5.0)
Alkaline Phosphatase: 74 U/L (ref 38–126)
Anion gap: 14 (ref 5–15)
BUN: 16 mg/dL (ref 8–23)
CO2: 27 mmol/L (ref 22–32)
Calcium: 10.3 mg/dL (ref 8.9–10.3)
Chloride: 101 mmol/L (ref 98–111)
Creatinine: 1.04 mg/dL — ABNORMAL HIGH (ref 0.44–1.00)
GFR, Estimated: 52 mL/min — ABNORMAL LOW
Glucose, Bld: 192 mg/dL — ABNORMAL HIGH (ref 70–99)
Potassium: 4 mmol/L (ref 3.5–5.1)
Sodium: 142 mmol/L (ref 135–145)
Total Bilirubin: 0.3 mg/dL (ref 0.0–1.2)
Total Protein: 8 g/dL (ref 6.5–8.1)

## 2024-05-15 LAB — CBC WITH DIFFERENTIAL (CANCER CENTER ONLY)
Abs Immature Granulocytes: 0.04 K/uL (ref 0.00–0.07)
Basophils Absolute: 0.1 K/uL (ref 0.0–0.1)
Basophils Relative: 1 %
Eosinophils Absolute: 0.2 K/uL (ref 0.0–0.5)
Eosinophils Relative: 2 %
HCT: 40.4 % (ref 36.0–46.0)
Hemoglobin: 12.6 g/dL (ref 12.0–15.0)
Immature Granulocytes: 0 %
Lymphocytes Relative: 31 %
Lymphs Abs: 3.4 K/uL (ref 0.7–4.0)
MCH: 30.3 pg (ref 26.0–34.0)
MCHC: 31.2 g/dL (ref 30.0–36.0)
MCV: 97.1 fL (ref 80.0–100.0)
Monocytes Absolute: 1.1 K/uL — ABNORMAL HIGH (ref 0.1–1.0)
Monocytes Relative: 10 %
Neutro Abs: 6.1 K/uL (ref 1.7–7.7)
Neutrophils Relative %: 56 %
Platelet Count: 276 K/uL (ref 150–400)
RBC: 4.16 MIL/uL (ref 3.87–5.11)
RDW: 14.3 % (ref 11.5–15.5)
WBC Count: 10.8 K/uL — ABNORMAL HIGH (ref 4.0–10.5)
nRBC: 0 % (ref 0.0–0.2)

## 2024-05-15 NOTE — Progress Notes (Signed)
 Patient doing good; no new or acute concerns at this time.

## 2024-05-15 NOTE — Progress Notes (Signed)
 "    Hematology/Oncology Consult note [facility]  Telephone:([phone] [phone]  Patient Care Team: Gretta Comer POUR, NP as PCP - General (Internal Medicine) Georjean Darice HERO, MD as Consulting Physician (Neurology) Maurie Rayfield BIRCH, RN as Oncology Nurse Navigator Grant, Morna SAILOR, [facility] (Inactive) as Pharmacist (Pharmacist) Georjean Darice HERO, MD as Consulting Physician (Neurology) Waylan Cain, MD as Consulting Physician (Ophthalmology) Melanee Annah BROCKS, MD as Consulting Physician (Oncology)   Name of the patient: Shannon Higgins  [id number]  1935-04-22   Date of visit: 05/15/2024  Diagnosis-  high-grade serous carcinoma of the ovary FIGO stage IC pT1 cpNX cMX s/p bilateral salpingo-oophorectomy   Chief complaint/ Reason for visit-routine follow-up of ovarian cancer  Heme/Onc history: patient is a 89 year old female with a past medical history significant for type 2 diabetes, UTIs, hypertension GERD among other medical problems.  She will is diagnosed with UTI and as a part of her Work-up had CT abdomen and pelvis with contrast.  That showed a 13.5 11.8 x 15.2 cm simple cystic appearing area within the pelvis.  Ultrasound of the pelvis showed 17 x 10.7 x 11.9 cm complex cystic midline pelvic mass.  Findings indeterminate and could reflect a cystic ovarian neoplasm.  She did have CA-125 checked which was normal at 22.9 but HD4 was elevated at 99.  Patient underwent bilateral salpingo-oophorectomy.  She has had prior hysterectomy.  Pathology showed high-grade serous carcinoma involving both ovaries but no involvement of the fallopian tube.  Omentum was negative for malignancy.  Lymph nodes not sampled.  Tumor size 4 cm high-grade.  FIGO stage IC peritoneal/ascitic fluid involvement was not identified.  Patient has baseline neuropathy. Therefore carbo/doxil  chosen for adjuvant chemotherapy upto 6 cycles if tolerated.    Patient completed 4 cycles of Carbo  doxil  chemotherapy in July 2022 but could not tolerate further doses and was therefore stopped. She has been in remission since then.    Interval history- Shannon Higgins is an 89 year old female with gynecologic malignancy, type 2 diabetes mellitus, and chronic kidney disease who presents for routine hematology and oncology surveillance.  She was last evaluated by gynecologic oncology in October 2025.  She reports feeling well overall, with preserved appetite and no recent falls. She denies abdominal bloating, pain, or distension. She experiences intermittent nausea and diarrhea approximately every two weeks, typically associated with bowel movements.  Her diabetes regimen was recently changed, and she has been taking less metformin  and will be started on another medication because of kidney issues. She reports no new symptoms or oncologic concerns.       ECOG PS- 1 Pain scale- 0   Review of systems- Review of Systems  Constitutional:  Positive for malaise/fatigue. Negative for chills, fever and weight loss.  HENT:  Negative for congestion, ear discharge and nosebleeds.   Eyes:  Negative for blurred vision.  Respiratory:  Negative for cough, hemoptysis, sputum production, shortness of breath and wheezing.   Cardiovascular:  Negative for chest pain, palpitations, orthopnea and claudication.  Gastrointestinal:  Negative for abdominal pain, blood in stool, constipation, diarrhea, heartburn, melena, nausea and vomiting.  Genitourinary:  Negative for dysuria, flank pain, frequency, hematuria and urgency.  Musculoskeletal:  Negative for back pain, joint pain and myalgias.  Skin:  Negative for rash.  Neurological:  Positive for sensory change (Peripheral neuropathy). Negative for dizziness, tingling, focal weakness, seizures, weakness and headaches.  Endo/Heme/Allergies:  Does not bruise/bleed easily.  Psychiatric/Behavioral:  Negative for depression and suicidal ideas.  The patient does not  have insomnia.       Allergies[1]   Past Medical History:  Diagnosis Date   Acute bronchitis 05/13/2022   Acute metabolic encephalopathy 04/23/2020   Acute tubular injury of transplanted kidney 06/14/2016   AKI (acute kidney injury) 06/14/2016   Asthma    Breast cancer (HCC) 2015   left   Bronchitis    CAP (community acquired pneumonia) 04/29/2012   Chronic sinusitis    Closed fracture of medial malleolus 05/11/2018   Community acquired pneumonia 04/29/2012   Confusion 12/06/2016   COVID-19 virus infection 12/17/2020   Diabetes mellitus without complication (HCC)    Family history of breast cancer    GERD (gastroesophageal reflux disease)    History of ankle fracture 05/14/2018   Last Assessment & Plan:  With a recent fall. Right medial malleous. Has ortho follow up soon, splinted now and tylenol  helps her pain   History of recurrent UTIs    Hypertension    Hypokalemia 04/26/2012   Hyponatremia    Hypothyroidism    Imbalance 09/02/2019   Insomnia    Migraine    Neuropathic pain    Orthostatic hypotension 05/08/2018   Ovarian cancer (HCC) 2022   Ovarian mass, left 07/14/2020   Personal history of breast cancer    Personal history of chemotherapy current   bilateral ovarian ca   Pneumonia    Rheumatoid arthritis (HCC)    Sepsis (HCC) 04/26/2012   Sepsis secondary to UTI (HCC) 04/26/2012   Sleep apnea    Stroke [facility])    seen in CT scan   Thyroid  disease    Transient hypotension 06/14/2016     Past Surgical History:  Procedure Laterality Date   ABDOMINAL HYSTERECTOMY     BACK SURGERY     BREAST BIOPSY Right ?`   benign   BREAST LUMPECTOMY Left 2015   breast ca   CHOLECYSTECTOMY     JOINT REPLACEMENT     LAPAROTOMY N/A 07/14/2020   Procedure: LAPAROTOMY;  Surgeon: Arloa Lamar SQUIBB, MD;  Location: [facility] ORS;  Service: Gynecology;  Laterality: N/A;   OOPHORECTOMY     OVARIAN CYST REMOVAL     PORTA CATH INSERTION N/A 07/27/2020   Procedure: PORTA CATH  INSERTION;  Surgeon: Marea Selinda RAMAN, MD;  Location: [facility] INVASIVE CV LAB;  Service: Cardiovascular;  Laterality: N/A;   PORTA CATH REMOVAL N/A 04/05/2021   Procedure: PORTA CATH REMOVAL;  Surgeon: Marea Selinda RAMAN, MD;  Location: [facility] INVASIVE CV LAB;  Service: Cardiovascular;  Laterality: N/A;   REPLACEMENT TOTAL KNEE Right    SALPINGOOPHORECTOMY Bilateral 07/14/2020   Procedure: OPEN SALPINGO OOPHORECTOMY;  Surgeon: Arloa Lamar SQUIBB, MD;  Location: [facility] ORS;  Service: Gynecology;  Laterality: Bilateral;   TOTAL SHOULDER REPLACEMENT Left     Social History   Socioeconomic History   Marital status: Widowed    Spouse name: Not on file   Number of children: 2   Years of education: Not on file   Highest education level: Some college, no degree  Occupational History   Occupation: retired   Tobacco Use   Smoking status: Never   Smokeless tobacco: Never  Vaping Use   Vaping status: Never Used  Substance and Sexual Activity   Alcohol use: No   Drug use: No   Sexual activity: Not Currently    Partners: Male    Birth control/protection: Post-menopausal  Other Topics Concern   Not on file  Social History Narrative  Pt lives in 1 story home with her daughter, Luke and [name]'s husband   Has 2 adult daughters   Highest level of education: some college   Worked mainly as environmental health practitioner.   Social Drivers of Health   Tobacco Use: Low Risk (05/15/2024)   Patient History    Smoking Tobacco Use: Never    Smokeless Tobacco Use: Never    Passive Exposure: Not on file  Financial Resource Strain: Low Risk (04/30/2024)   Overall Financial Resource Strain (CARDIA)    Difficulty of Paying Living Expenses: Not hard at all  Food Insecurity: No Food Insecurity (04/30/2024)   Epic    Worried About Programme Researcher, Broadcasting/film/video in the Last Year: Never true    Ran Out of Food in the Last Year: Never true  Transportation Needs: No Transportation Needs (04/30/2024)   Epic    Lack of Transportation  (Medical): No    Lack of Transportation (Non-Medical): No  Physical Activity: Inactive (04/30/2024)   Exercise Vital Sign    Days of Exercise per Week: 0 days    Minutes of Exercise per Session: Not on file  Stress: No Stress Concern Present (04/30/2024)   [employer] of Occupational Health - Occupational Stress Questionnaire    Feeling of Stress: Not at all  Social Connections: Moderately Isolated (04/30/2024)   Social Connection and Isolation Panel    Frequency of Communication with Friends and Family: Three times a week    Frequency of Social Gatherings with Friends and Family: More than three times a week    Attends Religious Services: 1 to 4 times per year    Active Member of [employer] or Organizations: No    Attends Banker Meetings: Not on file    Marital Status: Widowed  Intimate Partner Violence: Not At Risk (08/07/2023)   Humiliation, Afraid, Rape, and Kick questionnaire    Fear of Current or Ex-Partner: No    Emotionally Abused: No    Physically Abused: No    Sexually Abused: No  Depression (PHQ2-9): Low Risk (05/15/2024)   Depression (PHQ2-9)    PHQ-2 Score: 0  Alcohol Screen: Low Risk (07/20/2023)   Alcohol Screen    Last Alcohol Screening Score (AUDIT): 0  Housing: Low Risk (04/30/2024)   Epic    Unable to Pay for Housing in the Last Year: No    Number of Times Moved in the Last Year: 0    Homeless in the Last Year: No  Utilities: Not At Risk (08/07/2023)   AHC Utilities    Threatened with loss of utilities: No  Health Literacy: Adequate Health Literacy (07/20/2023)   B1300 Health Literacy    Frequency of need for help with medical instructions: Never    Family History  Problem Relation Age of Onset   Hypertension Mother    Diabetes Daughter    Hypertension Daughter    Fibromyalgia Daughter    GER disease Daughter    Fibromyalgia Daughter    Crohn's disease Daughter    Asthma Daughter    Breast cancer Niece    Uterine cancer Niece    Breast  cancer Other    Bladder Cancer Neg Hx    Renal cancer Neg Hx     Current Medications[2]  Physical exam:  Vitals:   05/15/24 1325  BP: (!) 126/32  Pulse: (!) 58  Resp: 19  Temp: 97.6 F (36.4 C)  TempSrc: Tympanic  SpO2: 96%  Weight: 158 lb 3.2 oz (71.8 kg)  Height: 5' 4 (1.626 m)   Physical Exam Cardiovascular:     Rate and Rhythm: Normal rate and regular rhythm.     Heart sounds: Normal heart sounds.  Pulmonary:     Effort: Pulmonary effort is normal.     Breath sounds: Normal breath sounds.  Abdominal:     General: Bowel sounds are normal. There is no distension.     Palpations: Abdomen is soft.     Tenderness: There is no abdominal tenderness.  Skin:    General: Skin is warm and dry.  Neurological:     Mental Status: She is alert and oriented to person, place, and time.      I have personally reviewed labs listed below:    Latest Ref Rng & Units 05/15/2024    1:07 PM  CMP  Glucose 70 - 99 mg/dL 807   BUN 8 - 23 mg/dL 16   Creatinine 9.55 - 1.00 mg/dL 8.95   Sodium 864 - 854 mmol/L 142   Potassium 3.5 - 5.1 mmol/L 4.0   Chloride 98 - 111 mmol/L 101   CO2 22 - 32 mmol/L 27   Calcium  8.9 - 10.3 mg/dL 89.6   Total Protein 6.5 - 8.1 g/dL 8.0   Total Bilirubin 0.0 - 1.2 mg/dL 0.3   Alkaline Phos 38 - 126 U/L 74   AST 15 - 41 U/L 39   ALT 0 - 44 U/L 24       Latest Ref Rng & Units 05/15/2024    1:07 PM  CBC  WBC 4.0 - 10.5 K/uL 10.8   Hemoglobin 12.0 - 15.0 g/dL 87.3   Hematocrit 63.9 - 46.0 % 40.4   Platelets 150 - 400 K/uL 276      Assessment and plan- Patient is a 89 y.o. female here for a routine surveillance visit of ovarian cancer  Assessment and Plan    Clinically patient is doing well with no concerning signs or symptoms of recurrence based on today's exam.  CA125 is currently pending.  Patient denies any abdominal pain distention.  No indication for imaging at this time.  I will see her back in 1 year with CBC with differential CMP and  CA125    Visit Diagnosis 1. Encounter for follow-up surveillance of ovarian cancer      Dr. Annah Skene, MD, MPH [facility] [phone] 05/15/2024 4:23 PM                   [1] No Known Allergies [2]  Current Outpatient Medications:    Accu-Chek FastClix Lancets MISC, USE AS INSTRUCTED TO TEST BLOOD SUGAR DAILY, Disp: 102 each, Rfl: 1   albuterol  (VENTOLIN  HFA) 108 (90 Base) MCG/ACT inhaler, Inhale 1-2 puffs into the lungs every 6 (six) hours as needed., Disp: 18 g, Rfl: 0   Ascorbic Acid (VITAMIN C ) 100 MG tablet, Take 1 tablet (100 mg total) by mouth daily., Disp: 60 tablet, Rfl: 2   aspirin  EC 325 MG tablet, Take 325 mg by mouth daily., Disp: , Rfl:    b complex vitamins capsule, Take 1 capsule by mouth daily., Disp: , Rfl:    blood glucose meter kit and supplies, Dispense based on patient and insurance preference. Use up to 2 times daily as directed. (FOR ICD-10 E10.9, E11.9)., Disp: 1 each, Rfl: 0   Blood Glucose Monitoring Suppl DEVI, 1 each by Does not apply route in the morning, at noon, and at bedtime. May substitute  to any manufacturer covered by patient's insurance., Disp: 1 each, Rfl: 0   botulinum toxin Type A  (BOTOX ) 200 units injection, INJECT 155 UNITS INTRAMUSCULARLY INTO MULTIPLE SITES OF FACE, HEAD, AND NECK EVERY 90 DAYS, Disp: 1 each, Rfl: 4   Cranberry 1000 MG CAPS, Take 1 capsule by mouth 2 (two) times daily., Disp: , Rfl:    diclofenac  Sodium (VOLTAREN ) 1 % GEL, Apply 2 g topically 3 (three) times daily as needed. For joint pain, Disp: 100 g, Rfl: 2   donepezil  (ARICEPT ) 10 MG tablet, Take 1 tablet daily, Disp: 90 tablet, Rfl: 4   dorzolamide -timolol  (COSOPT ) 22.3-6.8 MG/ML ophthalmic solution, Place 1 drop into both eyes 2 (two) times daily., Disp: , Rfl:    DULoxetine  (CYMBALTA ) 60 MG capsule, Take 1 capsule (60 mg total) by mouth daily. For depression, Disp: 90 capsule, Rfl: 2   empagliflozin  (JARDIANCE ) 10 MG TABS  tablet, Take 1 tablet (10 mg total) by mouth daily. for diabetes and kidney protection, Disp: 90 tablet, Rfl: 1   estradiol  (ESTRACE ) 0.1 MG/GM vaginal cream, Apply at bedtime, 1g 3 times a week on Monday, Wednesday, Friday., Disp: 42.5 g, Rfl: 3   fluticasone  (FLONASE ) 50 MCG/ACT nasal spray, USE 2 SPRAYS IN EACH NOSTRIL DAILY AS NEEDED FOR ALLERGIES OR RHINITIS, Disp: 48 g, Rfl: 2   gabapentin  (NEURONTIN ) 600 MG tablet, Take 1 tablet (600 mg total) by mouth 2 (two) times daily. For neuropathy, Disp: 180 tablet, Rfl: 2   Glucose Blood (BLOOD GLUCOSE TEST STRIPS) STRP, 1 each by In Vitro route in the morning, at noon, and at bedtime. May substitute to any manufacturer covered by patient's insurance., Disp: 100 strip, Rfl: 5   Lancets Misc. MISC, 1 each by Does not apply route in the morning, at noon, and at bedtime. May substitute to any manufacturer covered by patient's insurance., Disp: 100 each, Rfl: 5   latanoprost  (XALATAN ) 0.005 % ophthalmic solution, Place 1 drop into both eyes at bedtime., Disp: , Rfl:    levothyroxine  (SYNTHROID ) 112 MCG tablet, TAKE 1 TABLET EVERY MORNING MONDAY THROUGH SATURDAY; AND 1/2 TABLET ON SUNDAY. TAKE ON AN EMPTY STOMACH WITH WATER ONLY, Disp: 78 tablet, Rfl: 2   meclizine  (ANTIVERT ) 25 MG tablet, Take 1 tablet (25 mg total) by mouth 2 (two) times daily as needed for dizziness., Disp: 180 tablet, Rfl: 0   meloxicam  (MOBIC ) 15 MG tablet, TAKE 1 TABLET EVERY DAY AS NEEDED FOR PAIN, Disp: 90 tablet, Rfl: 0   memantine  (NAMENDA ) 5 MG tablet, Take 1 tablet (5 mg total) by mouth 2 (two) times daily., Disp: 180 tablet, Rfl: 4   metFORMIN  (GLUCOPHAGE -XR) 500 MG 24 hr tablet, Take 1 tablet (500 mg total) by mouth 2 (two) times daily with a meal. for diabetes., Disp: , Rfl:    methenamine  (HIPREX ) 1 g tablet, Take 1 tablet (1 g total) by mouth 2 (two) times daily with a meal., Disp: 30 tablet, Rfl: 0   mirtazapine  (REMERON ) 15 MG tablet, Take 1 tablet (15 mg total) by mouth at  bedtime. For sleep, Disp: 90 tablet, Rfl: 1   Netarsudil Dimesylate (RHOPRESSA) 0.02 % SOLN, Place 1 drop into both eyes daily at 6 (six) AM., Disp: , Rfl:    nystatin  cream (MYCOSTATIN ), Apply 1 Application topically 2 (two) times daily as needed., Disp: 30 g, Rfl: 2   omeprazole  (PRILOSEC) 20 MG capsule, TAKE 1 CAPSULE TWICE DAILY FOR HEARTBURN., Disp: 180 capsule, Rfl: 2   phenazopyridine  (PYRIDIUM ) 200 MG  tablet, Take 1 tablet (200 mg total) by mouth 3 (three) times daily as needed for pain., Disp: 10 tablet, Rfl: 0   pilocarpine  (PILOCAR) 2 % ophthalmic solution, Place 1 drop into both eyes 4 (four) times daily. , Disp: , Rfl:    propranolol  (INDERAL ) 80 MG tablet, Take 1 tablet (80 mg total) by mouth daily. For headache prevention, Disp: 90 tablet, Rfl: 0   rosuvastatin  (CRESTOR ) 10 MG tablet, TAKE 1 TABLET EVERY DAY FOR CHOLESTEROL, Disp: 90 tablet, Rfl: 2   SUMAtriptan  (IMITREX ) 25 MG tablet, TAKE 1 TABLET AS DIRECTED EVERY 2 HOURS AS NEEDED FOR MIGRAINE. MAY REPEAT IN 2 HOURS IF HEADACHE PERSISTS OR RECURS., Disp: 9 tablet, Rfl: 8   triamcinolone  ointment (KENALOG ) 0.5 %, Apply 1 Application topically 2 (two) times daily., Disp: 30 g, Rfl: 0   VITAMIN D  PO, Take by mouth., Disp: , Rfl:   "

## 2024-05-16 LAB — CA 125: Cancer Antigen (CA) 125: 16.3 U/mL (ref 0.0–38.1)

## 2024-05-17 NOTE — Telephone Encounter (Signed)
 Per [employer], they successfully qualified the walker order and are contacting pt to schedule delivery. Fyi to [place].

## 2024-05-17 NOTE — Telephone Encounter (Signed)
 Noted

## 2024-05-23 MED ORDER — EMPAGLIFLOZIN 10 MG PO TABS: 10.0000 mg | ORAL_TABLET | Freq: Every day | ORAL | 0 refills | Status: AC

## 2024-06-17 ENCOUNTER — Telehealth: Admitting: Obstetrics

## 2024-07-23 ENCOUNTER — Ambulatory Visit

## 2024-08-07 ENCOUNTER — Encounter: Admitting: Primary Care

## 2025-01-17 ENCOUNTER — Ambulatory Visit: Payer: Self-pay | Admitting: Neurology

## 2025-02-19 ENCOUNTER — Inpatient Hospital Stay

## 2025-05-16 ENCOUNTER — Inpatient Hospital Stay

## 2025-05-16 ENCOUNTER — Inpatient Hospital Stay: Admitting: Oncology
